# Patient Record
Sex: Female | Born: 1935 | Race: Black or African American | Hispanic: No | Smoking: Never smoker
Health system: Southern US, Community
[De-identification: ages and names within clinical notes are randomized; demographics above are authoritative.]

## PROBLEM LIST (undated history)

## (undated) DIAGNOSIS — K219 Gastro-esophageal reflux disease without esophagitis: Secondary | ICD-10-CM

## (undated) DIAGNOSIS — I639 Cerebral infarction, unspecified: Secondary | ICD-10-CM

## (undated) DIAGNOSIS — M1990 Unspecified osteoarthritis, unspecified site: Secondary | ICD-10-CM

## (undated) DIAGNOSIS — D649 Anemia, unspecified: Secondary | ICD-10-CM

## (undated) DIAGNOSIS — B351 Tinea unguium: Secondary | ICD-10-CM

## (undated) DIAGNOSIS — N39490 Overflow incontinence: Secondary | ICD-10-CM

## (undated) DIAGNOSIS — R634 Abnormal weight loss: Secondary | ICD-10-CM

## (undated) DIAGNOSIS — I4891 Unspecified atrial fibrillation: Secondary | ICD-10-CM

## (undated) DIAGNOSIS — K5730 Diverticulosis of large intestine without perforation or abscess without bleeding: Secondary | ICD-10-CM

## (undated) DIAGNOSIS — N393 Stress incontinence (female) (male): Secondary | ICD-10-CM

## (undated) DIAGNOSIS — I509 Heart failure, unspecified: Secondary | ICD-10-CM

## (undated) DIAGNOSIS — F0390 Unspecified dementia without behavioral disturbance: Secondary | ICD-10-CM

## (undated) DIAGNOSIS — Z8742 Personal history of other diseases of the female genital tract: Secondary | ICD-10-CM

## (undated) DIAGNOSIS — J984 Other disorders of lung: Secondary | ICD-10-CM

## (undated) DIAGNOSIS — Z950 Presence of cardiac pacemaker: Secondary | ICD-10-CM

## (undated) DIAGNOSIS — E119 Type 2 diabetes mellitus without complications: Secondary | ICD-10-CM

## (undated) DIAGNOSIS — D126 Benign neoplasm of colon, unspecified: Secondary | ICD-10-CM

## (undated) DIAGNOSIS — I10 Essential (primary) hypertension: Secondary | ICD-10-CM

## (undated) DIAGNOSIS — Z8619 Personal history of other infectious and parasitic diseases: Secondary | ICD-10-CM

## (undated) HISTORY — DX: Unspecified osteoarthritis, unspecified site: M19.90

## (undated) HISTORY — DX: Heart failure, unspecified: I50.9

## (undated) HISTORY — DX: Abnormal weight loss: R63.4

## (undated) HISTORY — DX: Other disorders of lung: J98.4

## (undated) HISTORY — DX: Essential (primary) hypertension: I10

## (undated) HISTORY — PX: CHOLECYSTECTOMY: SHX55

## (undated) HISTORY — DX: Benign neoplasm of colon, unspecified: D12.6

## (undated) HISTORY — DX: Cerebral infarction, unspecified: I63.9

## (undated) HISTORY — DX: Diverticulosis of large intestine without perforation or abscess without bleeding: K57.30

## (undated) HISTORY — DX: Personal history of other diseases of the female genital tract: Z87.42

## (undated) HISTORY — DX: Tinea unguium: B35.1

## (undated) HISTORY — PX: COLONOSCOPY: SHX174

## (undated) HISTORY — DX: Overflow incontinence: N39.490

## (undated) HISTORY — DX: Unspecified atrial fibrillation: I48.91

## (undated) HISTORY — PX: ESOPHAGOGASTRODUODENOSCOPY: SHX1529

## (undated) HISTORY — DX: Stress incontinence (female) (male): N39.3

## (undated) HISTORY — PX: CATARACT EXTRACTION: SUR2

## (undated) HISTORY — PX: ABDOMINAL HYSTERECTOMY: SHX81

## (undated) HISTORY — DX: Personal history of other infectious and parasitic diseases: Z86.19

## (undated) HISTORY — DX: Anemia, unspecified: D64.9

## (undated) HISTORY — DX: Gastro-esophageal reflux disease without esophagitis: K21.9

---

## 1994-04-18 DIAGNOSIS — J984 Other disorders of lung: Secondary | ICD-10-CM

## 1994-04-18 HISTORY — DX: Other disorders of lung: J98.4

## 1997-11-10 ENCOUNTER — Encounter: Admission: RE | Admit: 1997-11-10 | Discharge: 1997-11-10 | Payer: Self-pay | Admitting: Internal Medicine

## 1998-01-20 ENCOUNTER — Ambulatory Visit: Admission: RE | Admit: 1998-01-20 | Discharge: 1998-01-20 | Payer: Self-pay | Admitting: *Deleted

## 1998-02-13 ENCOUNTER — Encounter: Admission: RE | Admit: 1998-02-13 | Discharge: 1998-02-13 | Payer: Self-pay | Admitting: Internal Medicine

## 1998-02-13 ENCOUNTER — Ambulatory Visit (HOSPITAL_COMMUNITY): Admission: RE | Admit: 1998-02-13 | Discharge: 1998-02-13 | Payer: Self-pay | Admitting: Internal Medicine

## 1998-02-13 ENCOUNTER — Encounter: Payer: Self-pay | Admitting: Internal Medicine

## 1998-02-23 ENCOUNTER — Encounter: Admission: RE | Admit: 1998-02-23 | Discharge: 1998-02-23 | Payer: Self-pay | Admitting: Internal Medicine

## 1998-03-19 ENCOUNTER — Encounter: Admission: RE | Admit: 1998-03-19 | Discharge: 1998-03-19 | Payer: Self-pay | Admitting: Internal Medicine

## 1998-03-19 ENCOUNTER — Encounter: Payer: Self-pay | Admitting: Internal Medicine

## 1998-03-19 ENCOUNTER — Ambulatory Visit (HOSPITAL_COMMUNITY): Admission: RE | Admit: 1998-03-19 | Discharge: 1998-03-19 | Payer: Self-pay | Admitting: Internal Medicine

## 1998-04-18 HISTORY — PX: INSERT / REPLACE / REMOVE PACEMAKER: SUR710

## 1998-07-07 ENCOUNTER — Encounter: Admission: RE | Admit: 1998-07-07 | Discharge: 1998-07-07 | Payer: Self-pay | Admitting: Hematology and Oncology

## 1998-09-29 ENCOUNTER — Encounter: Admission: RE | Admit: 1998-09-29 | Discharge: 1998-09-29 | Payer: Self-pay | Admitting: Hematology and Oncology

## 1998-10-13 ENCOUNTER — Encounter: Admission: RE | Admit: 1998-10-13 | Discharge: 1998-10-13 | Payer: Self-pay | Admitting: Hematology and Oncology

## 1999-01-25 ENCOUNTER — Encounter: Admission: RE | Admit: 1999-01-25 | Discharge: 1999-01-25 | Payer: Self-pay | Admitting: Internal Medicine

## 1999-02-01 ENCOUNTER — Encounter: Payer: Self-pay | Admitting: Emergency Medicine

## 1999-02-01 ENCOUNTER — Inpatient Hospital Stay (HOSPITAL_COMMUNITY): Admission: EM | Admit: 1999-02-01 | Discharge: 1999-02-08 | Payer: Self-pay | Admitting: Emergency Medicine

## 1999-02-19 ENCOUNTER — Encounter: Admission: RE | Admit: 1999-02-19 | Discharge: 1999-02-19 | Payer: Self-pay | Admitting: Internal Medicine

## 1999-03-29 ENCOUNTER — Inpatient Hospital Stay (HOSPITAL_COMMUNITY): Admission: EM | Admit: 1999-03-29 | Discharge: 1999-04-03 | Payer: Self-pay | Admitting: Internal Medicine

## 1999-03-29 ENCOUNTER — Encounter: Payer: Self-pay | Admitting: Internal Medicine

## 1999-03-29 ENCOUNTER — Encounter: Admission: RE | Admit: 1999-03-29 | Discharge: 1999-03-29 | Payer: Self-pay | Admitting: Internal Medicine

## 1999-04-02 ENCOUNTER — Encounter: Payer: Self-pay | Admitting: Internal Medicine

## 1999-05-10 ENCOUNTER — Encounter: Admission: RE | Admit: 1999-05-10 | Discharge: 1999-05-10 | Payer: Self-pay | Admitting: Internal Medicine

## 1999-09-13 ENCOUNTER — Encounter: Admission: RE | Admit: 1999-09-13 | Discharge: 1999-09-13 | Payer: Self-pay | Admitting: Hematology and Oncology

## 1999-09-27 ENCOUNTER — Encounter: Admission: RE | Admit: 1999-09-27 | Discharge: 1999-09-27 | Payer: Self-pay | Admitting: Hematology and Oncology

## 1999-11-15 ENCOUNTER — Encounter: Admission: RE | Admit: 1999-11-15 | Discharge: 1999-11-15 | Payer: Self-pay | Admitting: Internal Medicine

## 1999-11-23 ENCOUNTER — Ambulatory Visit (HOSPITAL_COMMUNITY): Admission: RE | Admit: 1999-11-23 | Discharge: 1999-11-23 | Payer: Self-pay | Admitting: Hematology and Oncology

## 2000-01-11 ENCOUNTER — Ambulatory Visit (HOSPITAL_COMMUNITY): Admission: RE | Admit: 2000-01-11 | Discharge: 2000-01-11 | Payer: Self-pay | Admitting: Internal Medicine

## 2000-01-11 ENCOUNTER — Encounter: Payer: Self-pay | Admitting: Internal Medicine

## 2000-01-11 ENCOUNTER — Encounter: Admission: RE | Admit: 2000-01-11 | Discharge: 2000-01-11 | Payer: Self-pay | Admitting: Internal Medicine

## 2000-01-24 ENCOUNTER — Observation Stay (HOSPITAL_COMMUNITY): Admission: EM | Admit: 2000-01-24 | Discharge: 2000-01-24 | Payer: Self-pay | Admitting: Emergency Medicine

## 2000-01-31 ENCOUNTER — Ambulatory Visit (HOSPITAL_COMMUNITY): Admission: RE | Admit: 2000-01-31 | Discharge: 2000-01-31 | Payer: Self-pay | Admitting: Internal Medicine

## 2000-01-31 ENCOUNTER — Encounter: Admission: RE | Admit: 2000-01-31 | Discharge: 2000-01-31 | Payer: Self-pay | Admitting: Internal Medicine

## 2000-03-28 ENCOUNTER — Ambulatory Visit (HOSPITAL_COMMUNITY): Admission: RE | Admit: 2000-03-28 | Discharge: 2000-03-28 | Payer: Self-pay

## 2000-03-28 ENCOUNTER — Encounter: Admission: RE | Admit: 2000-03-28 | Discharge: 2000-03-28 | Payer: Self-pay

## 2000-04-18 DIAGNOSIS — I639 Cerebral infarction, unspecified: Secondary | ICD-10-CM

## 2000-04-18 HISTORY — DX: Cerebral infarction, unspecified: I63.9

## 2000-04-19 ENCOUNTER — Encounter: Admission: RE | Admit: 2000-04-19 | Discharge: 2000-04-19 | Payer: Self-pay | Admitting: Hematology and Oncology

## 2000-04-19 ENCOUNTER — Emergency Department (HOSPITAL_COMMUNITY): Admission: EM | Admit: 2000-04-19 | Discharge: 2000-04-19 | Payer: Self-pay

## 2000-04-20 ENCOUNTER — Encounter: Payer: Self-pay | Admitting: Internal Medicine

## 2000-04-20 ENCOUNTER — Inpatient Hospital Stay (HOSPITAL_COMMUNITY): Admission: EM | Admit: 2000-04-20 | Discharge: 2000-04-29 | Payer: Self-pay

## 2000-04-22 ENCOUNTER — Encounter: Payer: Self-pay | Admitting: Internal Medicine

## 2000-05-11 ENCOUNTER — Encounter: Admission: RE | Admit: 2000-05-11 | Discharge: 2000-05-11 | Payer: Self-pay | Admitting: Internal Medicine

## 2000-05-29 ENCOUNTER — Encounter: Admission: RE | Admit: 2000-05-29 | Discharge: 2000-05-29 | Payer: Self-pay | Admitting: Internal Medicine

## 2000-06-05 ENCOUNTER — Encounter: Admission: RE | Admit: 2000-06-05 | Discharge: 2000-06-05 | Payer: Self-pay

## 2000-06-12 ENCOUNTER — Encounter: Admission: RE | Admit: 2000-06-12 | Discharge: 2000-06-12 | Payer: Self-pay | Admitting: Internal Medicine

## 2000-06-26 ENCOUNTER — Encounter: Admission: RE | Admit: 2000-06-26 | Discharge: 2000-06-26 | Payer: Self-pay | Admitting: Internal Medicine

## 2000-07-04 ENCOUNTER — Other Ambulatory Visit: Admission: RE | Admit: 2000-07-04 | Discharge: 2000-07-04 | Payer: Self-pay | Admitting: Obstetrics and Gynecology

## 2000-07-17 ENCOUNTER — Encounter: Admission: RE | Admit: 2000-07-17 | Discharge: 2000-07-17 | Payer: Self-pay | Admitting: Internal Medicine

## 2000-07-27 ENCOUNTER — Emergency Department (HOSPITAL_COMMUNITY): Admission: EM | Admit: 2000-07-27 | Discharge: 2000-07-27 | Payer: Self-pay | Admitting: Emergency Medicine

## 2000-07-27 ENCOUNTER — Encounter: Payer: Self-pay | Admitting: Emergency Medicine

## 2000-07-31 ENCOUNTER — Encounter: Admission: RE | Admit: 2000-07-31 | Discharge: 2000-07-31 | Payer: Self-pay

## 2000-08-28 ENCOUNTER — Encounter: Admission: RE | Admit: 2000-08-28 | Discharge: 2000-08-28 | Payer: Self-pay | Admitting: Internal Medicine

## 2000-09-18 ENCOUNTER — Encounter: Admission: RE | Admit: 2000-09-18 | Discharge: 2000-09-18 | Payer: Self-pay | Admitting: Internal Medicine

## 2000-10-27 ENCOUNTER — Encounter: Admission: RE | Admit: 2000-10-27 | Discharge: 2000-10-27 | Payer: Self-pay | Admitting: Internal Medicine

## 2000-11-02 ENCOUNTER — Encounter: Admission: RE | Admit: 2000-11-02 | Discharge: 2000-11-02 | Payer: Self-pay | Admitting: Internal Medicine

## 2000-12-04 ENCOUNTER — Encounter: Admission: RE | Admit: 2000-12-04 | Discharge: 2000-12-04 | Payer: Self-pay | Admitting: Internal Medicine

## 2000-12-15 ENCOUNTER — Encounter: Admission: RE | Admit: 2000-12-15 | Discharge: 2000-12-15 | Payer: Self-pay | Admitting: Internal Medicine

## 2000-12-29 ENCOUNTER — Encounter: Admission: RE | Admit: 2000-12-29 | Discharge: 2000-12-29 | Payer: Self-pay | Admitting: Internal Medicine

## 2001-01-01 ENCOUNTER — Encounter: Admission: RE | Admit: 2001-01-01 | Discharge: 2001-01-01 | Payer: Self-pay | Admitting: Internal Medicine

## 2001-01-15 ENCOUNTER — Encounter: Admission: RE | Admit: 2001-01-15 | Discharge: 2001-01-15 | Payer: Self-pay | Admitting: Internal Medicine

## 2001-01-22 ENCOUNTER — Encounter: Admission: RE | Admit: 2001-01-22 | Discharge: 2001-01-22 | Payer: Self-pay

## 2001-02-15 ENCOUNTER — Encounter: Admission: RE | Admit: 2001-02-15 | Discharge: 2001-02-15 | Payer: Self-pay

## 2001-03-06 ENCOUNTER — Ambulatory Visit (HOSPITAL_COMMUNITY): Admission: RE | Admit: 2001-03-06 | Discharge: 2001-03-06 | Payer: Self-pay | Admitting: Obstetrics

## 2001-03-12 ENCOUNTER — Encounter: Admission: RE | Admit: 2001-03-12 | Discharge: 2001-03-12 | Payer: Self-pay | Admitting: Internal Medicine

## 2001-03-27 LAB — FECAL OCCULT BLOOD, GUAIAC: Fecal Occult Blood: NEGATIVE

## 2001-03-28 ENCOUNTER — Encounter: Admission: RE | Admit: 2001-03-28 | Discharge: 2001-03-28 | Payer: Self-pay | Admitting: Internal Medicine

## 2001-04-16 ENCOUNTER — Encounter: Admission: RE | Admit: 2001-04-16 | Discharge: 2001-04-16 | Payer: Self-pay

## 2001-05-07 ENCOUNTER — Encounter: Admission: RE | Admit: 2001-05-07 | Discharge: 2001-05-07 | Payer: Self-pay

## 2001-05-21 ENCOUNTER — Encounter: Admission: RE | Admit: 2001-05-21 | Discharge: 2001-05-21 | Payer: Self-pay | Admitting: Internal Medicine

## 2001-06-11 ENCOUNTER — Encounter: Admission: RE | Admit: 2001-06-11 | Discharge: 2001-06-11 | Payer: Self-pay

## 2001-06-28 ENCOUNTER — Encounter: Admission: RE | Admit: 2001-06-28 | Discharge: 2001-06-28 | Payer: Self-pay

## 2001-07-09 ENCOUNTER — Encounter: Admission: RE | Admit: 2001-07-09 | Discharge: 2001-07-09 | Payer: Self-pay | Admitting: Internal Medicine

## 2001-07-23 ENCOUNTER — Encounter: Admission: RE | Admit: 2001-07-23 | Discharge: 2001-07-23 | Payer: Self-pay | Admitting: Internal Medicine

## 2001-08-14 ENCOUNTER — Encounter: Admission: RE | Admit: 2001-08-14 | Discharge: 2001-08-14 | Payer: Self-pay

## 2001-09-17 ENCOUNTER — Encounter: Admission: RE | Admit: 2001-09-17 | Discharge: 2001-09-17 | Payer: Self-pay | Admitting: Internal Medicine

## 2001-10-01 ENCOUNTER — Encounter: Admission: RE | Admit: 2001-10-01 | Discharge: 2001-10-01 | Payer: Self-pay | Admitting: Internal Medicine

## 2001-10-02 ENCOUNTER — Encounter: Admission: RE | Admit: 2001-10-02 | Discharge: 2001-10-02 | Payer: Self-pay | Admitting: Internal Medicine

## 2001-10-29 ENCOUNTER — Encounter: Admission: RE | Admit: 2001-10-29 | Discharge: 2001-10-29 | Payer: Self-pay | Admitting: Internal Medicine

## 2001-11-19 ENCOUNTER — Encounter: Admission: RE | Admit: 2001-11-19 | Discharge: 2001-11-19 | Payer: Self-pay | Admitting: Internal Medicine

## 2001-11-30 ENCOUNTER — Encounter: Admission: RE | Admit: 2001-11-30 | Discharge: 2001-11-30 | Payer: Self-pay | Admitting: Internal Medicine

## 2002-01-07 ENCOUNTER — Encounter: Admission: RE | Admit: 2002-01-07 | Discharge: 2002-01-07 | Payer: Self-pay | Admitting: Internal Medicine

## 2002-02-01 ENCOUNTER — Encounter: Admission: RE | Admit: 2002-02-01 | Discharge: 2002-02-01 | Payer: Self-pay | Admitting: Internal Medicine

## 2002-02-05 ENCOUNTER — Encounter: Admission: RE | Admit: 2002-02-05 | Discharge: 2002-02-05 | Payer: Self-pay | Admitting: Internal Medicine

## 2002-02-11 ENCOUNTER — Encounter: Admission: RE | Admit: 2002-02-11 | Discharge: 2002-02-11 | Payer: Self-pay | Admitting: Internal Medicine

## 2002-02-25 ENCOUNTER — Encounter: Admission: RE | Admit: 2002-02-25 | Discharge: 2002-02-25 | Payer: Self-pay | Admitting: Internal Medicine

## 2002-03-11 ENCOUNTER — Encounter: Admission: RE | Admit: 2002-03-11 | Discharge: 2002-03-11 | Payer: Self-pay | Admitting: Internal Medicine

## 2002-03-28 ENCOUNTER — Encounter: Payer: Self-pay | Admitting: Internal Medicine

## 2002-03-28 ENCOUNTER — Ambulatory Visit (HOSPITAL_COMMUNITY): Admission: RE | Admit: 2002-03-28 | Discharge: 2002-03-28 | Payer: Self-pay | Admitting: Internal Medicine

## 2002-04-22 ENCOUNTER — Encounter: Admission: RE | Admit: 2002-04-22 | Discharge: 2002-04-22 | Payer: Self-pay | Admitting: Internal Medicine

## 2002-05-24 ENCOUNTER — Encounter: Admission: RE | Admit: 2002-05-24 | Discharge: 2002-05-24 | Payer: Self-pay | Admitting: Internal Medicine

## 2002-06-10 ENCOUNTER — Encounter: Admission: RE | Admit: 2002-06-10 | Discharge: 2002-06-10 | Payer: Self-pay | Admitting: Internal Medicine

## 2002-07-11 ENCOUNTER — Emergency Department (HOSPITAL_COMMUNITY): Admission: EM | Admit: 2002-07-11 | Discharge: 2002-07-11 | Payer: Self-pay | Admitting: Emergency Medicine

## 2002-07-11 ENCOUNTER — Encounter: Admission: RE | Admit: 2002-07-11 | Discharge: 2002-07-11 | Payer: Self-pay | Admitting: Internal Medicine

## 2002-07-19 ENCOUNTER — Encounter: Admission: RE | Admit: 2002-07-19 | Discharge: 2002-07-19 | Payer: Self-pay | Admitting: Internal Medicine

## 2002-07-25 ENCOUNTER — Ambulatory Visit (HOSPITAL_COMMUNITY): Admission: RE | Admit: 2002-07-25 | Discharge: 2002-07-25 | Payer: Self-pay | Admitting: Internal Medicine

## 2002-07-25 ENCOUNTER — Encounter: Payer: Self-pay | Admitting: Internal Medicine

## 2002-08-05 ENCOUNTER — Encounter: Admission: RE | Admit: 2002-08-05 | Discharge: 2002-08-05 | Payer: Self-pay | Admitting: Internal Medicine

## 2002-09-09 ENCOUNTER — Encounter: Admission: RE | Admit: 2002-09-09 | Discharge: 2002-09-09 | Payer: Self-pay | Admitting: Internal Medicine

## 2002-10-10 ENCOUNTER — Encounter: Admission: RE | Admit: 2002-10-10 | Discharge: 2002-10-10 | Payer: Self-pay | Admitting: Internal Medicine

## 2002-11-04 ENCOUNTER — Encounter: Admission: RE | Admit: 2002-11-04 | Discharge: 2002-11-04 | Payer: Self-pay | Admitting: Internal Medicine

## 2002-11-11 ENCOUNTER — Encounter: Admission: RE | Admit: 2002-11-11 | Discharge: 2002-11-11 | Payer: Self-pay | Admitting: Internal Medicine

## 2002-12-02 ENCOUNTER — Encounter: Admission: RE | Admit: 2002-12-02 | Discharge: 2002-12-02 | Payer: Self-pay | Admitting: Internal Medicine

## 2003-01-13 ENCOUNTER — Encounter: Admission: RE | Admit: 2003-01-13 | Discharge: 2003-01-13 | Payer: Self-pay | Admitting: Internal Medicine

## 2003-01-30 ENCOUNTER — Encounter: Admission: RE | Admit: 2003-01-30 | Discharge: 2003-01-30 | Payer: Self-pay | Admitting: Internal Medicine

## 2003-02-24 ENCOUNTER — Encounter: Admission: RE | Admit: 2003-02-24 | Discharge: 2003-02-24 | Payer: Self-pay | Admitting: Internal Medicine

## 2003-03-21 ENCOUNTER — Encounter: Admission: RE | Admit: 2003-03-21 | Discharge: 2003-03-21 | Payer: Self-pay | Admitting: Internal Medicine

## 2003-04-01 ENCOUNTER — Ambulatory Visit (HOSPITAL_COMMUNITY): Admission: RE | Admit: 2003-04-01 | Discharge: 2003-04-01 | Payer: Self-pay | Admitting: Internal Medicine

## 2003-05-05 ENCOUNTER — Encounter: Admission: RE | Admit: 2003-05-05 | Discharge: 2003-05-05 | Payer: Self-pay | Admitting: Internal Medicine

## 2003-06-02 ENCOUNTER — Encounter: Admission: RE | Admit: 2003-06-02 | Discharge: 2003-06-02 | Payer: Self-pay | Admitting: Internal Medicine

## 2003-06-30 ENCOUNTER — Encounter: Admission: RE | Admit: 2003-06-30 | Discharge: 2003-06-30 | Payer: Self-pay | Admitting: Internal Medicine

## 2003-07-28 ENCOUNTER — Encounter: Admission: RE | Admit: 2003-07-28 | Discharge: 2003-07-28 | Payer: Self-pay | Admitting: Internal Medicine

## 2003-08-04 ENCOUNTER — Encounter: Admission: RE | Admit: 2003-08-04 | Discharge: 2003-08-04 | Payer: Self-pay | Admitting: Internal Medicine

## 2003-08-13 ENCOUNTER — Encounter: Admission: RE | Admit: 2003-08-13 | Discharge: 2003-08-13 | Payer: Self-pay | Admitting: Internal Medicine

## 2003-08-25 ENCOUNTER — Encounter: Admission: RE | Admit: 2003-08-25 | Discharge: 2003-08-25 | Payer: Self-pay | Admitting: Internal Medicine

## 2003-09-01 ENCOUNTER — Encounter (HOSPITAL_BASED_OUTPATIENT_CLINIC_OR_DEPARTMENT_OTHER): Admission: RE | Admit: 2003-09-01 | Discharge: 2003-09-05 | Payer: Self-pay | Admitting: Internal Medicine

## 2003-09-29 ENCOUNTER — Encounter: Admission: RE | Admit: 2003-09-29 | Discharge: 2003-09-29 | Payer: Self-pay | Admitting: Internal Medicine

## 2003-10-30 ENCOUNTER — Encounter: Admission: RE | Admit: 2003-10-30 | Discharge: 2003-10-30 | Payer: Self-pay | Admitting: Internal Medicine

## 2003-11-27 ENCOUNTER — Encounter (INDEPENDENT_AMBULATORY_CARE_PROVIDER_SITE_OTHER): Payer: Self-pay | Admitting: Internal Medicine

## 2003-11-27 LAB — CONVERTED CEMR LAB: Pap Smear: NORMAL

## 2003-12-01 ENCOUNTER — Encounter: Admission: RE | Admit: 2003-12-01 | Discharge: 2003-12-01 | Payer: Self-pay | Admitting: Internal Medicine

## 2004-01-05 ENCOUNTER — Ambulatory Visit: Payer: Self-pay | Admitting: Internal Medicine

## 2004-01-14 ENCOUNTER — Encounter (HOSPITAL_BASED_OUTPATIENT_CLINIC_OR_DEPARTMENT_OTHER): Admission: RE | Admit: 2004-01-14 | Discharge: 2004-02-04 | Payer: Self-pay | Admitting: Internal Medicine

## 2004-02-03 ENCOUNTER — Ambulatory Visit: Payer: Self-pay | Admitting: Internal Medicine

## 2004-02-09 ENCOUNTER — Ambulatory Visit: Payer: Self-pay | Admitting: Internal Medicine

## 2004-02-23 ENCOUNTER — Ambulatory Visit: Payer: Self-pay | Admitting: Internal Medicine

## 2004-02-24 ENCOUNTER — Ambulatory Visit: Payer: Self-pay | Admitting: Internal Medicine

## 2004-03-01 ENCOUNTER — Ambulatory Visit: Payer: Self-pay | Admitting: Internal Medicine

## 2004-03-29 ENCOUNTER — Ambulatory Visit: Payer: Self-pay | Admitting: Internal Medicine

## 2004-04-05 ENCOUNTER — Ambulatory Visit (HOSPITAL_COMMUNITY): Admission: RE | Admit: 2004-04-05 | Discharge: 2004-04-05 | Payer: Self-pay | Admitting: Internal Medicine

## 2004-04-07 ENCOUNTER — Ambulatory Visit (HOSPITAL_COMMUNITY): Admission: RE | Admit: 2004-04-07 | Discharge: 2004-04-07 | Payer: Self-pay | Admitting: Internal Medicine

## 2004-04-27 ENCOUNTER — Encounter: Admission: RE | Admit: 2004-04-27 | Discharge: 2004-04-27 | Payer: Self-pay | Admitting: Internal Medicine

## 2004-04-27 ENCOUNTER — Encounter (HOSPITAL_BASED_OUTPATIENT_CLINIC_OR_DEPARTMENT_OTHER): Admission: RE | Admit: 2004-04-27 | Discharge: 2004-05-24 | Payer: Self-pay | Admitting: Internal Medicine

## 2004-04-29 ENCOUNTER — Ambulatory Visit: Payer: Self-pay | Admitting: Internal Medicine

## 2004-05-04 ENCOUNTER — Ambulatory Visit: Payer: Self-pay | Admitting: Internal Medicine

## 2004-06-10 ENCOUNTER — Ambulatory Visit: Payer: Self-pay | Admitting: Internal Medicine

## 2004-07-07 ENCOUNTER — Ambulatory Visit: Payer: Self-pay | Admitting: Internal Medicine

## 2004-07-20 ENCOUNTER — Ambulatory Visit: Payer: Self-pay | Admitting: Internal Medicine

## 2004-08-23 ENCOUNTER — Ambulatory Visit: Payer: Self-pay | Admitting: Internal Medicine

## 2004-09-27 ENCOUNTER — Ambulatory Visit: Payer: Self-pay | Admitting: Internal Medicine

## 2004-10-28 ENCOUNTER — Ambulatory Visit: Payer: Self-pay | Admitting: Internal Medicine

## 2004-11-15 ENCOUNTER — Ambulatory Visit: Payer: Self-pay | Admitting: Internal Medicine

## 2004-11-17 ENCOUNTER — Ambulatory Visit (HOSPITAL_COMMUNITY): Admission: RE | Admit: 2004-11-17 | Discharge: 2004-11-17 | Payer: Self-pay | Admitting: Internal Medicine

## 2004-11-19 ENCOUNTER — Ambulatory Visit (HOSPITAL_COMMUNITY): Admission: RE | Admit: 2004-11-19 | Discharge: 2004-11-19 | Payer: Self-pay | Admitting: Internal Medicine

## 2004-12-09 ENCOUNTER — Ambulatory Visit: Payer: Self-pay | Admitting: Internal Medicine

## 2005-01-03 ENCOUNTER — Ambulatory Visit: Payer: Self-pay | Admitting: Internal Medicine

## 2005-01-24 ENCOUNTER — Ambulatory Visit: Payer: Self-pay | Admitting: Hospitalist

## 2005-02-08 ENCOUNTER — Ambulatory Visit: Payer: Self-pay | Admitting: Internal Medicine

## 2005-02-21 ENCOUNTER — Ambulatory Visit: Payer: Self-pay | Admitting: Internal Medicine

## 2005-03-17 ENCOUNTER — Ambulatory Visit: Payer: Self-pay | Admitting: Internal Medicine

## 2005-04-04 ENCOUNTER — Ambulatory Visit: Payer: Self-pay | Admitting: Internal Medicine

## 2005-05-02 ENCOUNTER — Ambulatory Visit: Payer: Self-pay | Admitting: Internal Medicine

## 2005-05-12 ENCOUNTER — Ambulatory Visit (HOSPITAL_COMMUNITY): Admission: RE | Admit: 2005-05-12 | Discharge: 2005-05-12 | Payer: Self-pay | Admitting: Internal Medicine

## 2005-05-19 DIAGNOSIS — Z8742 Personal history of other diseases of the female genital tract: Secondary | ICD-10-CM

## 2005-05-19 HISTORY — DX: Personal history of other diseases of the female genital tract: Z87.42

## 2005-05-27 ENCOUNTER — Encounter (INDEPENDENT_AMBULATORY_CARE_PROVIDER_SITE_OTHER): Payer: Self-pay | Admitting: *Deleted

## 2005-05-27 ENCOUNTER — Ambulatory Visit (HOSPITAL_COMMUNITY): Admission: RE | Admit: 2005-05-27 | Discharge: 2005-05-27 | Payer: Self-pay | Admitting: Obstetrics & Gynecology

## 2005-05-30 ENCOUNTER — Ambulatory Visit: Payer: Self-pay | Admitting: Internal Medicine

## 2005-06-20 ENCOUNTER — Ambulatory Visit: Payer: Self-pay | Admitting: Hospitalist

## 2005-08-01 ENCOUNTER — Ambulatory Visit: Payer: Self-pay | Admitting: Internal Medicine

## 2005-08-22 ENCOUNTER — Ambulatory Visit: Payer: Self-pay | Admitting: Internal Medicine

## 2005-08-30 ENCOUNTER — Ambulatory Visit: Payer: Self-pay | Admitting: Internal Medicine

## 2005-09-19 ENCOUNTER — Ambulatory Visit: Payer: Self-pay | Admitting: Internal Medicine

## 2005-10-21 ENCOUNTER — Ambulatory Visit: Payer: Self-pay | Admitting: Internal Medicine

## 2005-11-02 ENCOUNTER — Ambulatory Visit (HOSPITAL_COMMUNITY): Admission: RE | Admit: 2005-11-02 | Discharge: 2005-11-02 | Payer: Self-pay | Admitting: Internal Medicine

## 2005-11-07 ENCOUNTER — Ambulatory Visit: Payer: Self-pay | Admitting: Hospitalist

## 2005-12-05 ENCOUNTER — Ambulatory Visit: Payer: Self-pay | Admitting: Internal Medicine

## 2006-01-02 ENCOUNTER — Ambulatory Visit: Payer: Self-pay | Admitting: Internal Medicine

## 2006-01-04 ENCOUNTER — Ambulatory Visit (HOSPITAL_COMMUNITY): Admission: RE | Admit: 2006-01-04 | Discharge: 2006-01-04 | Payer: Self-pay | Admitting: Internal Medicine

## 2006-01-23 ENCOUNTER — Ambulatory Visit: Payer: Self-pay | Admitting: Internal Medicine

## 2006-01-25 ENCOUNTER — Ambulatory Visit (HOSPITAL_COMMUNITY): Admission: RE | Admit: 2006-01-25 | Discharge: 2006-01-25 | Payer: Self-pay | Admitting: *Deleted

## 2006-03-07 ENCOUNTER — Ambulatory Visit: Payer: Self-pay | Admitting: Internal Medicine

## 2006-03-22 ENCOUNTER — Ambulatory Visit: Payer: Self-pay | Admitting: Internal Medicine

## 2006-04-19 ENCOUNTER — Encounter (INDEPENDENT_AMBULATORY_CARE_PROVIDER_SITE_OTHER): Payer: Self-pay | Admitting: Internal Medicine

## 2006-05-01 ENCOUNTER — Ambulatory Visit: Payer: Self-pay | Admitting: Internal Medicine

## 2006-05-01 LAB — CONVERTED CEMR LAB
ALT: 14 units/L (ref 0–35)
AST: 29 units/L (ref 0–37)
Albumin: 4.4 g/dL (ref 3.5–5.2)
Alkaline Phosphatase: 126 units/L — ABNORMAL HIGH (ref 39–117)
BUN: 13 mg/dL (ref 6–23)
Calcium: 9.9 mg/dL (ref 8.4–10.5)
Chloride: 104 meq/L (ref 96–112)
Creatinine, Ser: 1.11 mg/dL (ref 0.40–1.20)
HDL: 58 mg/dL (ref >39)
LDL Cholesterol: 64 mg/dL (ref 0–99)
Potassium: 4.3 meq/L (ref 3.5–5.3)
Total CHOL/HDL Ratio: 2.5

## 2006-05-16 ENCOUNTER — Ambulatory Visit (HOSPITAL_COMMUNITY): Admission: RE | Admit: 2006-05-16 | Discharge: 2006-05-16 | Payer: Self-pay | Admitting: Internal Medicine

## 2006-05-26 ENCOUNTER — Emergency Department (HOSPITAL_COMMUNITY): Admission: EM | Admit: 2006-05-26 | Discharge: 2006-05-26 | Payer: Self-pay | Admitting: Emergency Medicine

## 2006-06-12 ENCOUNTER — Ambulatory Visit: Payer: Self-pay | Admitting: Internal Medicine

## 2006-06-12 LAB — CONVERTED CEMR LAB: INR: 2.8

## 2006-07-17 ENCOUNTER — Ambulatory Visit: Payer: Self-pay | Admitting: *Deleted

## 2006-09-28 ENCOUNTER — Ambulatory Visit: Payer: Self-pay | Admitting: Internal Medicine

## 2006-09-28 LAB — CONVERTED CEMR LAB: INR: 1.9

## 2006-10-12 ENCOUNTER — Ambulatory Visit: Payer: Self-pay | Admitting: Internal Medicine

## 2006-10-16 ENCOUNTER — Ambulatory Visit: Payer: Self-pay | Admitting: Internal Medicine

## 2006-10-16 LAB — CONVERTED CEMR LAB: INR: 2

## 2006-10-23 ENCOUNTER — Telehealth (INDEPENDENT_AMBULATORY_CARE_PROVIDER_SITE_OTHER): Payer: Self-pay | Admitting: *Deleted

## 2006-10-24 ENCOUNTER — Telehealth: Payer: Self-pay | Admitting: *Deleted

## 2006-10-24 ENCOUNTER — Encounter (INDEPENDENT_AMBULATORY_CARE_PROVIDER_SITE_OTHER): Payer: Self-pay | Admitting: *Deleted

## 2006-11-13 ENCOUNTER — Ambulatory Visit: Payer: Self-pay | Admitting: *Deleted

## 2006-12-04 ENCOUNTER — Ambulatory Visit: Payer: Self-pay | Admitting: Internal Medicine

## 2006-12-04 ENCOUNTER — Encounter: Payer: Self-pay | Admitting: Internal Medicine

## 2006-12-04 LAB — CONVERTED CEMR LAB
Hemoglobin: 12.4 g/dL (ref 12.0–15.0)
INR: 9.2
INR: 9.2 (ref 0.0–1.5)
Prothrombin Time: 79.1 s — ABNORMAL HIGH (ref 11.6–15.2)
RBC: 3.97 M/uL (ref 3.87–5.11)
WBC: 4.9 10*3/uL (ref 4.0–10.5)

## 2006-12-05 ENCOUNTER — Ambulatory Visit: Payer: Self-pay | Admitting: Internal Medicine

## 2006-12-05 ENCOUNTER — Encounter: Admission: RE | Admit: 2006-12-05 | Discharge: 2006-12-05 | Payer: Self-pay | Admitting: *Deleted

## 2006-12-05 ENCOUNTER — Inpatient Hospital Stay (HOSPITAL_COMMUNITY): Admission: RE | Admit: 2006-12-05 | Discharge: 2006-12-06 | Payer: Self-pay | Admitting: *Deleted

## 2006-12-05 ENCOUNTER — Ambulatory Visit: Payer: Self-pay | Admitting: Cardiology

## 2006-12-05 ENCOUNTER — Ambulatory Visit: Payer: Self-pay | Admitting: *Deleted

## 2006-12-05 LAB — CONVERTED CEMR LAB: INR: 2.2

## 2006-12-06 ENCOUNTER — Encounter: Payer: Self-pay | Admitting: Internal Medicine

## 2006-12-11 ENCOUNTER — Ambulatory Visit: Payer: Self-pay | Admitting: Internal Medicine

## 2006-12-11 LAB — CONVERTED CEMR LAB

## 2007-01-24 ENCOUNTER — Telehealth: Payer: Self-pay | Admitting: *Deleted

## 2007-01-30 ENCOUNTER — Telehealth: Payer: Self-pay | Admitting: *Deleted

## 2007-02-05 ENCOUNTER — Ambulatory Visit: Payer: Self-pay | Admitting: Infectious Diseases

## 2007-02-05 LAB — CONVERTED CEMR LAB: INR: 5.8

## 2007-02-13 ENCOUNTER — Ambulatory Visit: Payer: Self-pay | Admitting: *Deleted

## 2007-02-13 LAB — CONVERTED CEMR LAB
ALT: 13 units/L (ref 0–35)
AST: 26 units/L (ref 0–37)
Alkaline Phosphatase: 96 units/L (ref 39–117)
BUN: 12 mg/dL (ref 6–23)
Calcium: 9.7 mg/dL (ref 8.4–10.5)
Chloride: 102 meq/L (ref 96–112)
Creatinine, Ser: 0.95 mg/dL (ref 0.40–1.20)
HDL: 56 mg/dL (ref >39)
INR: 2.2 — ABNORMAL HIGH (ref 0.0–1.5)
LDL Cholesterol: 74 mg/dL (ref 0–99)
Potassium: 3.8 meq/L (ref 3.5–5.3)
Prothrombin Time: 25.4 s — ABNORMAL HIGH (ref 11.6–15.2)
Total CHOL/HDL Ratio: 2.7
aPTT: 33 s (ref 24–37)

## 2007-02-19 ENCOUNTER — Ambulatory Visit: Payer: Self-pay | Admitting: Hospitalist

## 2007-02-19 LAB — CONVERTED CEMR LAB: INR: 2.6

## 2007-03-01 ENCOUNTER — Telehealth (INDEPENDENT_AMBULATORY_CARE_PROVIDER_SITE_OTHER): Payer: Self-pay | Admitting: *Deleted

## 2007-03-19 ENCOUNTER — Ambulatory Visit: Payer: Self-pay | Admitting: Hospitalist

## 2007-04-16 ENCOUNTER — Telehealth: Payer: Self-pay | Admitting: Internal Medicine

## 2007-05-01 ENCOUNTER — Encounter (INDEPENDENT_AMBULATORY_CARE_PROVIDER_SITE_OTHER): Payer: Self-pay | Admitting: *Deleted

## 2007-05-21 ENCOUNTER — Ambulatory Visit: Payer: Self-pay | Admitting: Hospitalist

## 2007-05-21 LAB — CONVERTED CEMR LAB

## 2007-05-24 ENCOUNTER — Ambulatory Visit (HOSPITAL_COMMUNITY): Admission: RE | Admit: 2007-05-24 | Discharge: 2007-05-24 | Payer: Self-pay | Admitting: *Deleted

## 2007-06-05 ENCOUNTER — Ambulatory Visit: Payer: Self-pay | Admitting: *Deleted

## 2007-06-05 LAB — CONVERTED CEMR LAB
BUN: 12 mg/dL (ref 6–23)
CO2: 25 meq/L (ref 19–32)
Chloride: 103 meq/L (ref 96–112)
Creatinine, Ser: 0.85 mg/dL (ref 0.40–1.20)
Glucose, Bld: 144 mg/dL — ABNORMAL HIGH (ref 70–99)
Potassium: 4.1 meq/L (ref 3.5–5.3)

## 2007-06-11 ENCOUNTER — Ambulatory Visit: Payer: Self-pay | Admitting: Internal Medicine

## 2007-06-12 ENCOUNTER — Ambulatory Visit: Payer: Self-pay | Admitting: Internal Medicine

## 2007-06-25 ENCOUNTER — Ambulatory Visit: Payer: Self-pay | Admitting: Infectious Diseases

## 2007-08-06 ENCOUNTER — Ambulatory Visit: Payer: Self-pay | Admitting: Hospitalist

## 2007-08-06 LAB — CONVERTED CEMR LAB: INR: 2.1

## 2007-09-09 ENCOUNTER — Emergency Department (HOSPITAL_COMMUNITY): Admission: EM | Admit: 2007-09-09 | Discharge: 2007-09-09 | Payer: Self-pay | Admitting: Emergency Medicine

## 2007-09-27 ENCOUNTER — Ambulatory Visit: Payer: Self-pay | Admitting: *Deleted

## 2007-09-27 LAB — CONVERTED CEMR LAB
Blood Glucose, Fingerstick: 132
Hgb A1c MFr Bld: 6.4 %

## 2007-11-19 ENCOUNTER — Ambulatory Visit: Payer: Self-pay | Admitting: Infectious Diseases

## 2007-11-19 LAB — CONVERTED CEMR LAB: INR: 3.6

## 2007-11-28 ENCOUNTER — Ambulatory Visit: Payer: Self-pay

## 2008-01-14 ENCOUNTER — Ambulatory Visit: Payer: Self-pay | Admitting: Internal Medicine

## 2008-01-23 ENCOUNTER — Emergency Department (HOSPITAL_COMMUNITY): Admission: EM | Admit: 2008-01-23 | Discharge: 2008-01-23 | Payer: Self-pay | Admitting: Emergency Medicine

## 2008-01-25 ENCOUNTER — Ambulatory Visit: Payer: Self-pay | Admitting: Vascular Surgery

## 2008-01-25 ENCOUNTER — Ambulatory Visit: Payer: Self-pay | Admitting: *Deleted

## 2008-01-25 ENCOUNTER — Encounter (INDEPENDENT_AMBULATORY_CARE_PROVIDER_SITE_OTHER): Payer: Self-pay | Admitting: *Deleted

## 2008-01-25 ENCOUNTER — Inpatient Hospital Stay (HOSPITAL_COMMUNITY): Admission: EM | Admit: 2008-01-25 | Discharge: 2008-01-29 | Payer: Self-pay | Admitting: Emergency Medicine

## 2008-01-25 ENCOUNTER — Ambulatory Visit: Payer: Self-pay | Admitting: Internal Medicine

## 2008-02-04 ENCOUNTER — Ambulatory Visit: Payer: Self-pay | Admitting: Internal Medicine

## 2008-02-04 LAB — CONVERTED CEMR LAB

## 2008-02-05 LAB — CONVERTED CEMR LAB: HDL: 46 mg/dL

## 2008-02-11 ENCOUNTER — Ambulatory Visit: Payer: Self-pay | Admitting: Internal Medicine

## 2008-02-11 LAB — CONVERTED CEMR LAB: INR: 1.5

## 2008-02-13 ENCOUNTER — Ambulatory Visit: Payer: Self-pay | Admitting: Internal Medicine

## 2008-02-13 ENCOUNTER — Encounter (INDEPENDENT_AMBULATORY_CARE_PROVIDER_SITE_OTHER): Payer: Self-pay | Admitting: *Deleted

## 2008-02-13 LAB — CONVERTED CEMR LAB
Blood Glucose, Fingerstick: 130
Hgb A1c MFr Bld: 7 %

## 2008-03-04 ENCOUNTER — Encounter (INDEPENDENT_AMBULATORY_CARE_PROVIDER_SITE_OTHER): Payer: Self-pay | Admitting: *Deleted

## 2008-03-11 ENCOUNTER — Ambulatory Visit: Payer: Self-pay | Admitting: *Deleted

## 2008-03-11 LAB — CONVERTED CEMR LAB
Blood Glucose, Fingerstick: 124
INR: 1.8

## 2008-03-17 ENCOUNTER — Ambulatory Visit: Payer: Self-pay | Admitting: Infectious Diseases

## 2008-03-17 LAB — CONVERTED CEMR LAB: INR: 2.5

## 2008-03-24 ENCOUNTER — Telehealth (INDEPENDENT_AMBULATORY_CARE_PROVIDER_SITE_OTHER): Payer: Self-pay | Admitting: *Deleted

## 2008-04-21 ENCOUNTER — Ambulatory Visit: Payer: Self-pay | Admitting: Internal Medicine

## 2008-04-28 ENCOUNTER — Ambulatory Visit: Payer: Self-pay | Admitting: Internal Medicine

## 2008-04-28 LAB — CONVERTED CEMR LAB: INR: 2.3

## 2008-05-27 ENCOUNTER — Encounter: Payer: Self-pay | Admitting: Internal Medicine

## 2008-06-10 ENCOUNTER — Ambulatory Visit: Payer: Self-pay | Admitting: Internal Medicine

## 2008-06-10 ENCOUNTER — Ambulatory Visit: Payer: Self-pay | Admitting: *Deleted

## 2008-06-10 LAB — CONVERTED CEMR LAB
ALT: 10 units/L (ref 0–35)
Alkaline Phosphatase: 89 units/L (ref 39–117)
CO2: 23 meq/L (ref 19–32)
Cholesterol: 157 mg/dL (ref 0–200)
Creatinine, Ser: 1.14 mg/dL (ref 0.40–1.20)
Hgb A1c MFr Bld: 6.4 %
INR: 1.8
LDL Cholesterol: 75 mg/dL (ref 0–99)
Total Bilirubin: 0.7 mg/dL (ref 0.3–1.2)
Total CHOL/HDL Ratio: 2.5
Triglycerides: 100 mg/dL (ref <150)
VLDL: 20 mg/dL (ref 0–40)

## 2008-06-23 ENCOUNTER — Ambulatory Visit: Payer: Self-pay | Admitting: Internal Medicine

## 2008-06-25 ENCOUNTER — Telehealth (INDEPENDENT_AMBULATORY_CARE_PROVIDER_SITE_OTHER): Payer: Self-pay | Admitting: *Deleted

## 2008-06-25 ENCOUNTER — Encounter (INDEPENDENT_AMBULATORY_CARE_PROVIDER_SITE_OTHER): Payer: Self-pay | Admitting: *Deleted

## 2008-07-01 ENCOUNTER — Telehealth (INDEPENDENT_AMBULATORY_CARE_PROVIDER_SITE_OTHER): Payer: Self-pay | Admitting: Pharmacy Technician

## 2008-07-01 ENCOUNTER — Ambulatory Visit (HOSPITAL_COMMUNITY): Admission: RE | Admit: 2008-07-01 | Discharge: 2008-07-01 | Payer: Self-pay | Admitting: *Deleted

## 2008-07-10 ENCOUNTER — Encounter (INDEPENDENT_AMBULATORY_CARE_PROVIDER_SITE_OTHER): Payer: Self-pay | Admitting: *Deleted

## 2008-07-14 ENCOUNTER — Ambulatory Visit: Payer: Self-pay | Admitting: Internal Medicine

## 2008-08-05 ENCOUNTER — Ambulatory Visit: Payer: Self-pay | Admitting: *Deleted

## 2008-08-06 ENCOUNTER — Encounter (INDEPENDENT_AMBULATORY_CARE_PROVIDER_SITE_OTHER): Payer: Self-pay | Admitting: *Deleted

## 2008-08-11 ENCOUNTER — Ambulatory Visit: Payer: Self-pay | Admitting: Internal Medicine

## 2008-08-11 LAB — CONVERTED CEMR LAB: INR: 2.1

## 2008-09-18 ENCOUNTER — Telehealth (INDEPENDENT_AMBULATORY_CARE_PROVIDER_SITE_OTHER): Payer: Self-pay | Admitting: *Deleted

## 2008-09-18 ENCOUNTER — Telehealth: Payer: Self-pay | Admitting: *Deleted

## 2008-10-27 ENCOUNTER — Ambulatory Visit: Payer: Self-pay | Admitting: Internal Medicine

## 2008-10-27 LAB — CONVERTED CEMR LAB

## 2008-11-20 ENCOUNTER — Ambulatory Visit: Payer: Self-pay

## 2008-11-20 ENCOUNTER — Encounter: Payer: Self-pay | Admitting: Internal Medicine

## 2008-12-15 ENCOUNTER — Ambulatory Visit (HOSPITAL_COMMUNITY): Admission: RE | Admit: 2008-12-15 | Discharge: 2008-12-15 | Payer: Self-pay | Admitting: Internal Medicine

## 2008-12-15 ENCOUNTER — Telehealth: Payer: Self-pay | Admitting: *Deleted

## 2008-12-15 ENCOUNTER — Ambulatory Visit: Payer: Self-pay | Admitting: Internal Medicine

## 2008-12-15 LAB — CONVERTED CEMR LAB
Blood Glucose, Fingerstick: 126
Hgb A1c MFr Bld: 6.5 %

## 2008-12-16 ENCOUNTER — Telehealth: Payer: Self-pay | Admitting: Internal Medicine

## 2009-01-12 ENCOUNTER — Ambulatory Visit: Payer: Self-pay | Admitting: Infectious Diseases

## 2009-02-05 ENCOUNTER — Telehealth: Payer: Self-pay | Admitting: Internal Medicine

## 2009-02-09 ENCOUNTER — Ambulatory Visit: Payer: Self-pay | Admitting: Internal Medicine

## 2009-02-10 ENCOUNTER — Telehealth: Payer: Self-pay | Admitting: Internal Medicine

## 2009-02-13 ENCOUNTER — Telehealth: Payer: Self-pay | Admitting: Internal Medicine

## 2009-03-03 ENCOUNTER — Ambulatory Visit: Payer: Self-pay | Admitting: Internal Medicine

## 2009-03-11 ENCOUNTER — Telehealth: Payer: Self-pay | Admitting: *Deleted

## 2009-03-16 LAB — CONVERTED CEMR LAB
ALT: 8 units/L (ref 0–35)
Albumin: 4.2 g/dL (ref 3.5–5.2)
BUN: 14 mg/dL (ref 6–23)
CO2: 21 meq/L (ref 19–32)
Calcium: 9 mg/dL (ref 8.4–10.5)
Creatinine, Ser: 1.05 mg/dL (ref 0.40–1.20)
Glucose, Bld: 149 mg/dL — ABNORMAL HIGH (ref 70–99)
HCT: 31.7 % — ABNORMAL LOW (ref 36.0–46.0)
Hemoglobin: 10 g/dL — ABNORMAL LOW (ref 12.0–15.0)
LDL Cholesterol: 68 mg/dL (ref 0–99)
MCV: 90.8 fL (ref >=78.0)
RBC: 3.49 M/uL — ABNORMAL LOW (ref 3.87–5.11)
TSH: 1.222 microintl units/mL (ref 0.350–4.5)
Total Bilirubin: 0.7 mg/dL (ref 0.3–1.2)
Vitamin B-12: 393 pg/mL (ref 211–911)
WBC: 5.3 10*3/uL (ref 4.0–10.5)

## 2009-03-17 ENCOUNTER — Ambulatory Visit: Payer: Self-pay | Admitting: Internal Medicine

## 2009-03-17 LAB — CONVERTED CEMR LAB: RBC Folate: 548 ng/mL (ref 180–600)

## 2009-03-27 LAB — CONVERTED CEMR LAB
Ferritin: 16 ng/mL (ref 10–291)
TIBC: 397 ug/dL (ref 250–470)
UIBC: 355 ug/dL

## 2009-04-27 ENCOUNTER — Ambulatory Visit: Payer: Self-pay | Admitting: Internal Medicine

## 2009-04-27 LAB — CONVERTED CEMR LAB: INR: 2

## 2009-05-18 ENCOUNTER — Ambulatory Visit: Payer: Self-pay | Admitting: Internal Medicine

## 2009-05-18 LAB — CONVERTED CEMR LAB: INR: 2.2

## 2009-05-26 ENCOUNTER — Ambulatory Visit: Payer: Self-pay | Admitting: Internal Medicine

## 2009-05-26 LAB — CONVERTED CEMR LAB
Blood Glucose, Fingerstick: 127
Microalb, Ur: 1.36 mg/dL (ref 0.00–1.89)

## 2009-07-02 ENCOUNTER — Ambulatory Visit (HOSPITAL_COMMUNITY): Admission: RE | Admit: 2009-07-02 | Discharge: 2009-07-02 | Payer: Self-pay | Admitting: Obstetrics & Gynecology

## 2009-07-06 ENCOUNTER — Ambulatory Visit: Payer: Self-pay | Admitting: Internal Medicine

## 2009-07-06 LAB — CONVERTED CEMR LAB: INR: 2.6

## 2009-07-07 ENCOUNTER — Ambulatory Visit: Payer: Self-pay | Admitting: Internal Medicine

## 2009-07-07 ENCOUNTER — Encounter (INDEPENDENT_AMBULATORY_CARE_PROVIDER_SITE_OTHER): Payer: Self-pay | Admitting: *Deleted

## 2009-07-13 LAB — CONVERTED CEMR LAB
Eosinophils Relative: 2 % (ref 0–5)
Ferritin: 10 ng/mL (ref 10–291)
HCT: 30.4 % — ABNORMAL LOW (ref 36.0–46.0)
Hemoglobin: 9.8 g/dL — ABNORMAL LOW (ref 12.0–15.0)
Iron: 50 ug/dL (ref 42–145)
Lymphocytes Relative: 42 % (ref 12–46)
MCHC: 32.2 g/dL (ref 30.0–36.0)
Monocytes Absolute: 0.7 10*3/uL (ref 0.1–1.0)
Monocytes Relative: 14 % — ABNORMAL HIGH (ref 3–12)
Neutro Abs: 2.1 10*3/uL (ref 1.7–7.7)
RBC: 3.39 M/uL — ABNORMAL LOW (ref 3.87–5.11)

## 2009-07-15 ENCOUNTER — Ambulatory Visit: Payer: Self-pay | Admitting: Internal Medicine

## 2009-08-31 ENCOUNTER — Ambulatory Visit: Payer: Self-pay | Admitting: Internal Medicine

## 2009-08-31 LAB — CONVERTED CEMR LAB: INR: 2.5

## 2009-10-05 ENCOUNTER — Telehealth (INDEPENDENT_AMBULATORY_CARE_PROVIDER_SITE_OTHER): Payer: Self-pay | Admitting: *Deleted

## 2009-10-05 ENCOUNTER — Encounter: Payer: Self-pay | Admitting: Internal Medicine

## 2009-10-06 ENCOUNTER — Encounter: Payer: Self-pay | Admitting: Internal Medicine

## 2009-10-06 ENCOUNTER — Inpatient Hospital Stay (HOSPITAL_COMMUNITY): Admission: EM | Admit: 2009-10-06 | Discharge: 2009-10-07 | Payer: Self-pay | Admitting: Emergency Medicine

## 2009-10-06 ENCOUNTER — Ambulatory Visit: Payer: Self-pay | Admitting: Cardiology

## 2009-10-07 ENCOUNTER — Encounter: Payer: Self-pay | Admitting: Internal Medicine

## 2009-10-12 ENCOUNTER — Ambulatory Visit: Payer: Self-pay | Admitting: Internal Medicine

## 2009-10-12 LAB — CONVERTED CEMR LAB: INR: 2.2

## 2009-10-22 ENCOUNTER — Ambulatory Visit: Payer: Self-pay | Admitting: Internal Medicine

## 2009-10-23 ENCOUNTER — Telehealth: Payer: Self-pay | Admitting: Internal Medicine

## 2009-10-23 LAB — CONVERTED CEMR LAB
BUN: 10 mg/dL (ref 6–23)
CO2: 24 meq/L (ref 19–32)
Calcium: 9.8 mg/dL (ref 8.4–10.5)
Creatinine, Ser: 0.98 mg/dL (ref 0.40–1.20)
Glucose, Bld: 80 mg/dL (ref 70–99)

## 2009-10-30 ENCOUNTER — Encounter (INDEPENDENT_AMBULATORY_CARE_PROVIDER_SITE_OTHER): Payer: Self-pay | Admitting: *Deleted

## 2009-10-30 ENCOUNTER — Ambulatory Visit: Payer: Self-pay | Admitting: Gastroenterology

## 2009-10-30 DIAGNOSIS — K5900 Constipation, unspecified: Secondary | ICD-10-CM | POA: Insufficient documentation

## 2009-11-02 ENCOUNTER — Encounter (INDEPENDENT_AMBULATORY_CARE_PROVIDER_SITE_OTHER): Payer: Self-pay | Admitting: *Deleted

## 2009-11-05 DIAGNOSIS — E538 Deficiency of other specified B group vitamins: Secondary | ICD-10-CM | POA: Insufficient documentation

## 2009-11-05 LAB — CONVERTED CEMR LAB
AST: 25 units/L (ref 0–37)
Alkaline Phosphatase: 77 units/L (ref 39–117)
Basophils Absolute: 0 10*3/uL (ref 0.0–0.1)
Bilirubin, Direct: 0.2 mg/dL (ref 0.0–0.3)
CO2: 29 meq/L (ref 19–32)
Calcium: 9.3 mg/dL (ref 8.4–10.5)
Ferritin: 10.3 ng/mL (ref 10.0–291.0)
Folate: 8.1 ng/mL
HCT: 30.2 % — ABNORMAL LOW (ref 36.0–46.0)
Lymphocytes Relative: 35.7 % (ref 12.0–46.0)
Lymphs Abs: 1.8 10*3/uL (ref 0.7–4.0)
Monocytes Relative: 14.6 % — ABNORMAL HIGH (ref 3.0–12.0)
Neutrophils Relative %: 47.3 % (ref 43.0–77.0)
Platelets: 176 10*3/uL (ref 150.0–400.0)
Potassium: 4.3 meq/L (ref 3.5–5.1)
RDW: 15.5 % — ABNORMAL HIGH (ref 11.5–14.6)
Saturation Ratios: 8.3 % — ABNORMAL LOW (ref 20.0–50.0)
Sodium: 141 meq/L (ref 135–145)
Total Protein: 7.6 g/dL (ref 6.0–8.3)
Transferrin: 285.1 mg/dL (ref 212.0–360.0)

## 2009-11-09 ENCOUNTER — Ambulatory Visit: Payer: Self-pay | Admitting: Gastroenterology

## 2009-11-16 ENCOUNTER — Ambulatory Visit: Payer: Self-pay | Admitting: Gastroenterology

## 2009-11-23 ENCOUNTER — Ambulatory Visit: Payer: Self-pay | Admitting: Gastroenterology

## 2009-11-27 ENCOUNTER — Emergency Department (HOSPITAL_COMMUNITY): Admission: EM | Admit: 2009-11-27 | Discharge: 2009-11-27 | Payer: Self-pay | Admitting: Emergency Medicine

## 2009-11-27 ENCOUNTER — Telehealth (INDEPENDENT_AMBULATORY_CARE_PROVIDER_SITE_OTHER): Payer: Self-pay | Admitting: Pharmacist

## 2009-11-30 ENCOUNTER — Ambulatory Visit: Payer: Self-pay | Admitting: Internal Medicine

## 2009-12-14 ENCOUNTER — Ambulatory Visit: Payer: Self-pay | Admitting: Gastroenterology

## 2009-12-16 ENCOUNTER — Encounter: Payer: Self-pay | Admitting: Gastroenterology

## 2009-12-25 ENCOUNTER — Ambulatory Visit: Payer: Self-pay | Admitting: Gastroenterology

## 2009-12-28 ENCOUNTER — Ambulatory Visit: Payer: Self-pay | Admitting: Internal Medicine

## 2009-12-28 LAB — CONVERTED CEMR LAB

## 2010-02-08 ENCOUNTER — Ambulatory Visit: Payer: Self-pay | Admitting: Internal Medicine

## 2010-02-10 ENCOUNTER — Ambulatory Visit: Payer: Self-pay | Admitting: Internal Medicine

## 2010-02-10 LAB — HM DIABETES FOOT EXAM

## 2010-02-10 LAB — CONVERTED CEMR LAB
Blood Glucose, Fingerstick: 116
Hgb A1c MFr Bld: 5.8 %

## 2010-02-15 ENCOUNTER — Ambulatory Visit: Payer: Self-pay | Admitting: Internal Medicine

## 2010-02-15 LAB — CONVERTED CEMR LAB: INR: 1.9

## 2010-03-29 ENCOUNTER — Ambulatory Visit: Payer: Self-pay | Admitting: Internal Medicine

## 2010-03-29 LAB — CONVERTED CEMR LAB

## 2010-03-30 ENCOUNTER — Ambulatory Visit: Payer: Self-pay | Admitting: Internal Medicine

## 2010-03-31 ENCOUNTER — Encounter: Payer: Self-pay | Admitting: Internal Medicine

## 2010-03-31 LAB — CONVERTED CEMR LAB
Ferritin: 16 ng/mL (ref 10–291)
Iron: 84 ug/dL (ref 42–145)

## 2010-04-02 LAB — CONVERTED CEMR LAB
Basophils Absolute: 0 10*3/uL (ref 0.0–0.1)
Basophils Relative: 1 % (ref 0–1)
MCHC: 32.9 g/dL (ref 30.0–36.0)
Neutro Abs: 2.8 10*3/uL (ref 1.7–7.7)
Neutrophils Relative %: 50 % (ref 43–77)
RDW: 17.3 % — ABNORMAL HIGH (ref 11.5–15.5)

## 2010-04-26 ENCOUNTER — Ambulatory Visit: Admission: RE | Admit: 2010-04-26 | Discharge: 2010-04-26 | Payer: Self-pay | Source: Home / Self Care

## 2010-05-09 DIAGNOSIS — Z8679 Personal history of other diseases of the circulatory system: Secondary | ICD-10-CM

## 2010-05-09 DIAGNOSIS — Z7901 Long term (current) use of anticoagulants: Secondary | ICD-10-CM

## 2010-05-09 DIAGNOSIS — I4891 Unspecified atrial fibrillation: Secondary | ICD-10-CM

## 2010-05-10 ENCOUNTER — Ambulatory Visit: Admission: RE | Admit: 2010-05-10 | Discharge: 2010-05-10 | Payer: Self-pay | Source: Home / Self Care

## 2010-05-10 LAB — CONVERTED CEMR LAB: INR: 2.1

## 2010-05-20 NOTE — Assessment & Plan Note (Signed)
Summary: [name]  Anticoagulant Therapy Managed by: [name]. [name]  PharmD CACP PCP: Zoila Shutter MD [facility] Attending: Josem Kaufmann MD, Lawrence Indication 1: TIA/CVA Indication 2: Aftercare long term use Anticoagulants V58.61, V58.83 Start date: 04/28/2000 Duration: Indefinite  Patient Assessment Reviewed by: [name] PharmD  April 27, 2009 Medication review: verified warfarin dosage & schedule,verified previous prescription medications, verified doses & any changes, verified new medications, reviewed OTC medications, reviewed OTC health products-vitamins supplements etc Complications: none Dietary changes: none   Health status changes: none   Lifestyle changes: none   Recent/future hospitalizations: none   Recent/future procedures: none   Recent/future dental: none Patient Assessment Part 2:  Have you MISSED ANY DOSES or CHANGED TABLETS?  No missed Warfarin doses or changed tablets.  Have you had any BRUISING or BLEEDING ( nose or gum bleeds,blood in urine or stool)?  No reported bruising or bleeding in nose, gums, urine, stool.  Have you STARTED or STOPPED any MEDICATIONS, including OTC meds,herbals or supplements?  No other medications or herbal supplements were started or stopped.  Have you CHANGED your DIET, especially green vegetables,or ALCOHOL intake?  No changes in diet or alcohol intake.  Have you had any ILLNESSES or HOSPITALIZATIONS?  No reported illnesses or hospitalizations  Have you had any signs of CLOTTING?(chest discomfort,dizziness,shortness of breath,arms tingling,slurred speech,swelling or redness in leg)    No chest discomfort, dizziness, shortness of breath, tingling in arm, slurred speech, swelling, or redness in leg.     Treatment  Target INR: 2.0-3.0 INR: 2.0  Date: 04/27/2009 Regimen In:  45.0mg /week INR reflects regimen in: 2.0  New  Tablet strength: : 5mg  Regimen Out:     Sunday: 1 & 1/2 Tablet     Monday: 1 Tablet     Tuesday: 1 &  1/2 Tablet     Wednesday: 1 & 1/2 Tablet     Thursday: 1 Tablet      Friday: 1 & 1/2 Tablet     Saturday: 1 & 1/2 Tablet Total Weekly: 47.5mg /week mg  Next INR Due: 05/18/2009 Adjusted by: [name]. [name] PharmD CACP   Return to anticoagulation clinic:  05/18/2009 Time of next visit: 1000    Allergies: No Known Drug Allergies

## 2010-05-20 NOTE — Assessment & Plan Note (Signed)
Summary: 261/cfb  Anticoagulant Therapy Managed by: [name]. [name]  PharmD CACP PCP: Zoila Shutter MD [facility] Attending: Coralee Pesa MD, Levada Schilling Indication 1: TIA/CVA Indication 2: Aftercare long term use Anticoagulants V58.61, V58.83 Start date: 04/28/2000 Duration: Indefinite  Patient Assessment Reviewed by: [name] PharmD  October 12, 2009 Medication review: verified warfarin dosage & schedule,verified previous prescription medications, verified doses & any changes, verified new medications, reviewed OTC medications, reviewed OTC health products-vitamins supplements etc Complications: none Dietary changes: none   Health status changes: none   Lifestyle changes: none   Recent/future hospitalizations: yes  Details: Recent hospitalization to the [facility]. Discharged on 5 days of ciprofloxicin. Recent/future procedures: none   Recent/future dental: none Patient Assessment Part 2:  Have you MISSED ANY DOSES or CHANGED TABLETS?  No missed Warfarin doses or changed tablets.  Have you had any BRUISING or BLEEDING ( nose or gum bleeds,blood in urine or stool)?  No reported bruising or bleeding in nose, gums, urine, stool.  Have you STARTED or STOPPED any MEDICATIONS, including OTC meds,herbals or supplements?  YES. Discharged from hospital after being admitted to [facility]. Discharged on ciprofloxicin, and enoxaparin--both of which have been completed.  Have you CHANGED your DIET, especially green vegetables,or ALCOHOL intake?  No changes in diet or alcohol intake.  Have you had any ILLNESSES or HOSPITALIZATIONS?  Yes. See.  Have you had any signs of CLOTTING?(chest discomfort,dizziness,shortness of breath,arms tingling,slurred speech,swelling or redness in leg)    No chest discomfort, dizziness, shortness of breath, tingling in arm, slurred speech, swelling, or redness in leg.     Treatment  Target INR: 2.0-3.0 INR: 2.2  Date: 10/12/2009 Regimen In:  50mg /wk INR reflects  regimen in: 2.2   Next INR Due: 10/20/2009 Adjusted by: [name]. [name] PharmD CACP   Return to anticoagulation clinic:  10/20/2009 Time of next visit: 1400    Allergies: No Known Drug Allergies

## 2010-05-20 NOTE — Assessment & Plan Note (Signed)
Summary: [name]  Anticoagulant Therapy Managed by: [name]. [name]  PharmD CACP PCP: [name], MD [name] Attending: Rogelia Boga MD, Lanora Manis Indication 1: TIA/CVA Indication 2: Aftercare long term use Anticoagulants V58.61, V58.83 Start date: 04/28/2000 Duration: Indefinite  Patient Assessment Reviewed by: [name] PharmD  April 26, 2010 Medication review: verified warfarin dosage & schedule,verified previous prescription medications, verified doses & any changes, verified new medications, reviewed OTC medications, reviewed OTC health products-vitamins supplements etc Complications: none Dietary changes: none   Health status changes: none   Lifestyle changes: none   Recent/future hospitalizations: none   Recent/future procedures: none   Recent/future dental: none Patient Assessment Part 2:  Have you MISSED ANY DOSES or CHANGED TABLETS?  No missed Warfarin doses or changed tablets.  Have you had any BRUISING or BLEEDING ( nose or gum bleeds,blood in urine or stool)?  No reported bruising or bleeding in nose, gums, urine, stool.  Have you STARTED or STOPPED any MEDICATIONS, including OTC meds,herbals or supplements?  No other medications or herbal supplements were started or stopped.  Have you CHANGED your DIET, especially green vegetables,or ALCOHOL intake?  No changes in diet or alcohol intake.  Have you had any ILLNESSES or HOSPITALIZATIONS?  No reported illnesses or hospitalizations  Have you had any signs of CLOTTING?(chest discomfort,dizziness,shortness of breath,arms tingling,slurred speech,swelling or redness in leg)    No chest discomfort, dizziness, shortness of breath, tingling in arm, slurred speech, swelling, or redness in leg.     Treatment  Target INR: 2.0-3.0 INR: 1.3  Date: 04/26/2010 Regimen In:  40.0mg /week INR reflects regimen in: 1.3  New  Tablet strength: : 5mg  Regimen Out:     Sunday: 1 Tablet     Monday: 1 & 1/2 Tablet     Tuesday: 1  & 1/2 Tablet     Wednesday: 1 & 1/2 Tablet     Thursday: 1 & 1/2 Tablet      Friday: 1 Tablet     Saturday: 1 Tablet Total Weekly: 45.0mg/week mg  Next INR Due: 05/10/2010 Adjusted by: James B. Groce III PharmD CACP   Return to anticoagulation clinic:  05/10/2010 Time of next visit: 0945    Allergies: No Known Drug Allergies Prescriptions: WARFARIN SODIUM 5 MG TABS (WARFARIN SODIUM) Per coumadin clinic  #50 x 02   Entered by:   Jay Groce PharmD   Authorized by:   Elizabeth Butcher MD   Signed by:   Jay Groce PharmD on 04/26/2010   Method used:   Electronically to        [facility] [id number] (retail)       [address]       [place], [place]  [address]       Ph: [phone]       Fax: [phone]   RxID:   [id number]

## 2010-05-20 NOTE — Procedures (Signed)
Summary: Upper Endoscopy  Patient: [name] Note: All result statuses are Final unless otherwise noted.  Tests: (1) Upper Endoscopy (EGD)   EGD Upper Endoscopy       DONE     [facility]     [address]     [place], [place]  [address]           ENDOSCOPY PROCEDURE REPORT           PATIENT:  Holly, Duran  MR#:  [id number]     BIRTHDATE:  26-Oct-1925, 84 yrs. old  GENDER:  female           ENDOSCOPIST:  Vania Rea. Jarold Motto, MD, [facility]     Referred by:           PROCEDURE DATE:  12/14/2009     PROCEDURE:  EGD, diagnostic     ASA CLASS:  Class III     INDICATIONS:  iron deficiency anemia, GERD           MEDICATIONS:   There was residual sedation effect present from     prior procedure., Versed 1 mg IV     TOPICAL ANESTHETIC:           DESCRIPTION OF PROCEDURE:   After the risks benefits and     alternatives of the procedure were thoroughly explained, informed     consent was obtained.  The LB [id number] endoscope was     introduced through the mouth and advanced to the second portion of     the duodenum, without limitations.  The instrument was slowly     withdrawn as the mucosa was fully examined.     <<PROCEDUREIMAGES>>           The upper, middle, and distal third of the esophagus were     carefully inspected and no abnormalities were noted. The z-line     was well seen at the GEJ. The endoscope was pushed into the fundus     which was normal including a retroflexed view. The antrum,gastric     body, first and second part of the duodenum were unremarkable.     Retroflexed views revealed no abnormalities.    The scope was then     withdrawn from the patient and the procedure completed.           COMPLICATIONS:  None           ENDOSCOPIC IMPRESSION:     1) Normal EGD     CHRONIC NONEROSIVE GERD ON PPI RX.     RECOMMENDATIONS:     1) continue current medications           REPEAT EXAM:  No           ______________________________     Vania Rea.  Jarold Motto, MD, Clementeen Graham           CC:  [name] MD           n.     Rosalie DoctorVania Rea. Patterson at 12/14/2009 10:47 AM           Ned Grace, [id number]  Note: An exclamation mark (!) indicates a result that was not dispersed into the flowsheet. Document Creation Date: 12/14/2009 10:49 AM _______________________________________________________________________  (1) Order result status: Final Collection or observation date-time: 12/14/2009 10:42 Requested date-time:  Receipt date-time:  Reported date-time:  Referring Physician:   Ordering Physician: Sheryn Bison [phone]) Specimen Source:  Source: Launa Grill Order  Number: [id number] Lab site:

## 2010-05-20 NOTE — Progress Notes (Signed)
  Phone Note Outgoing Call   Call placed by: [name] PharmD CACP Call placed to: Patient Action Taken: Phone Call Completed, Appt scheduled Reason for Call: Confirm/change Appt Details for Reason: Called to patient (answering maching [but will continue to try to get the patient or her daughter "live" on the telephone]) to indicate to her that she needs to come back to the Coumadin Clinic on MONDAY 18-JUL-11 at 10:30am.  Details of Action Taken: Phone call completed. Summary of Call: Called and left message indicating NEXT APPT MONDAY 18-Jul-11 at 1030h. Initial call taken by: [name] PharmD CACP

## 2010-05-20 NOTE — Progress Notes (Signed)
  Phone Note From Other Clinic   Caller: Provider Call For: [name] PharmD CACP Reason for Call: Need Referral Information, Medication Check, Diagnosis Check Request: Talk with Provider, Call Patient Action Taken: Phone Call Completed, Provider Notified, Patient called Details for Reason: Called to ED by Dr. Iantha Fallen for information regarding her warfarin therapy. Patient had fallen sustaining a hematoma on her left upper arm. Details of Complaint: INR = 5.52 Details of Action Taken: HOLD/OMIT warfarin x 2 days/doses (Today 12-Aug and Tommorrow 13-Aug-11). Take 5mg  warfarin on SUNDAY 14-Aug-11 and on MONDAY 15-Aug-11 at 0900---come to [facility] for INR. Written instructions given to the patient IN the ED. Initial call taken by: [name]

## 2010-05-20 NOTE — Assessment & Plan Note (Signed)
Summary: 10:00 am/cfb  Anticoagulant Therapy Managed by: [name]. Janie Morning  PharmD CACP PCP: [name], MD [facility] Attending: Onalee Hua MD, Manrique Indication 1: TIA/CVA Indication 2: Aftercare long term use Anticoagulants V58.61, V58.83 Start date: 04/28/2000 Duration: Indefinite  Patient Assessment Reviewed by: [name] PharmD  February 08, 2010 Medication review: verified warfarin dosage & schedule,verified previous prescription medications, verified doses & any changes, verified new medications, reviewed OTC medications, reviewed OTC health products-vitamins supplements etc Complications: none Dietary changes: none   Health status changes: none   Lifestyle changes: none   Recent/future hospitalizations: none   Recent/future procedures: none   Recent/future dental: none Patient Assessment Part 2:  Have you MISSED ANY DOSES or CHANGED TABLETS?  No missed Warfarin doses or changed tablets.  Have you had any BRUISING or BLEEDING ( nose or gum bleeds,blood in urine or stool)?  No reported bruising or bleeding in nose, gums, urine, stool.  Have you STARTED or STOPPED any MEDICATIONS, including OTC meds,herbals or supplements?  No other medications or herbal supplements were started or stopped.  Have you CHANGED your DIET, especially green vegetables,or ALCOHOL intake?  No changes in diet or alcohol intake.  Have you had any ILLNESSES or HOSPITALIZATIONS?  No reported illnesses or hospitalizations  Have you had any signs of CLOTTING?(chest discomfort,dizziness,shortness of breath,arms tingling,slurred speech,swelling or redness in leg)    No chest discomfort, dizziness, shortness of breath, tingling in arm, slurred speech, swelling, or redness in leg.     Treatment  Target INR: 2.0-3.0 INR: 5.0  Date: 02/08/2010 Regimen In:  45.0mg /week INR reflects regimen in: 5.0  New  Tablet strength: : 5mg  Regimen Out:     Sunday: 1 Tablet     Monday: 1 Tablet     Tuesday: 1  Tablet     Wednesday: 1 Tablet     Thursday: 1 Tablet      Friday: 1 Tablet     Saturday: 1 Tablet Total Weekly: 35.0mg /week mg  Next INR Due: 02/15/2010 Adjusted by: [name]. [name] PharmD CACP   Return to anticoagulation clinic:  02/15/2010 Time of next visit: 1000  Hold:  1 Days     Allergies: No Known Drug Allergies
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
## 2010-05-20 NOTE — Assessment & Plan Note (Signed)
Summary: 77 YR F/U  Medications Added WARFARIN SODIUM 5 MG TABS (WARFARIN SODIUM) Use as directed by Anticoagulation Clinic SLOW RELEASE IRON 47.5 MG CR-TABS (FERROUS SULFATE) Take one tablet by mouth once daily.      Allergies Added: NKDA  Visit Type:  Follow-up Primary Provider:  Zoila Shutter MD   History of Present Illness: [name] returns today for followup of her PPM and HTN.  She is a pleasant woman with a h/;o tachybrady syndrome, atrial fib, and a stroke for which she is now on coumadin.  She has done well in the past year except that she has been losing weight.  She thinks that her DM has been the reason for her weight loss.  No other complaints today.  Current Medications (verified): 1)  Lopressor 100 Mg Tabs (Metoprolol Tartrate) .... Take 1 Tablet By Mouth Two Times A Day 2)  Lasix 20 Mg Tabs (Furosemide) .... Take 1 Tablet By Mouth Once A Day 3)  Lisinopril 40 Mg  Tabs (Lisinopril) .... Take 1 Tablet By Mouth Once A Day 4)  Metformin Hcl 500 Mg Tabs (Metformin Hcl) .... Take Two Tabs By Mouth A Day 5)  Warfarin Sodium 5 Mg Tabs (Warfarin Sodium) .... Use As Directed By Anticoagulation Clinic 6)  Colace 100 Mg Caps (Docusate Sodium) .... Take 1 Capsule  By Mouth Two Times A Day As Needed For Constipation 7)  Vesicare 10 Mg Tabs (Solifenacin Succinate) .... Take 1 Tablet By Mouth Once A Day 8)  Norvasc 10 Mg Tabs (Amlodipine Besylate) .... Take 1 Tablet By Mouth Once A Day 9)  Omeprazole 20 Mg Tbec (Omeprazole) .... Take 1 Tablet By Mouth Once A Day 10)  Tramadol Hcl 50 Mg Tabs (Tramadol Hcl) .... Take One Tablet Every 6 Hours X 3 Days, Then Reduce To As Needed. 11)  Glycolax  Powd (Polyethylene Glycol 3350) .... Put One Capful in 8 Ounces of Water and Drink Daily or 2 Times A Day As Needed 12)  Slow Release Iron 47.5 Mg Cr-Tabs (Ferrous Sulfate) .... Take One Tablet By Mouth Once Daily.  Allergies (verified): No Known Drug Allergies  Past History:  Past  Medical History: Last updated: 03/03/2009 Allergic rhinitis Atrial fibrillation Congestive heart failure, diastolic dysfunction restrictive lung disease, mild , PFT's 1996 Diabetes mellitus, type II Hypertension Osteoporosis Cerebrovascular accident, R MCA, cardioembolic 2002 Urinary incontinence Anticoagulation therapy Sick Sinus syndrome, s/p Pacer Dr. [name] 2000 Onychomycosis Trichomonal urethritis, history Vaginal bleeding, atrophy, negative biopsy 2/07 Decline influenza vaccine 2/07, 11/10  Past Surgical History: Last updated: 04/19/2006 Cataract extraction Permanent pacemaker  Review of Systems  The patient denies chest pain, syncope, dyspnea on exertion, and peripheral edema.    Vital Signs:  Patient profile:   77 year old female Height:      65 inches Weight:      132 pounds BMI:     22.05 Pulse rate:   79 / minute BP sitting:   118 / 60  (left arm)  Vitals Entered By: Laurance Flatten CMA (July 15, 2009 10:05 AM)  Physical Exam  General:  Elderly, well developed, well nourished, in no acute distress.  HEENT: normal Neck: supple. No JVD. Carotids 2+ bilaterally no bruits Cor: IRIRR no rubs, gallops or murmur Lungs: CTA. Wheezes, rales, or rhonchi. Ab: soft, nontender. nondistended. No HSM. Good bowel sounds Ext: warm. no cyanosis, clubbing or edema Neuro: alert and oriented. Grossly nonfocal. affect pleasant    PPM Specifications Following MD:  Lewayne Bunting,  MD     PPM Vendor:  [employer]     PPM Model Number:  [id number]     PPM Serial Number:  [id number] PPM DOI:  04/01/1999      Lead 1    Location: RV     DOI: 04/01/1999     Model #: [id number]     Serial #: [id number]     Status: active  Magnet Response Rate:  BOL 85 ERI  65  Indications:  Sick sinus syndrome A-fib +coumadin   PPM Follow Up Remote Check?  No Battery Voltage:  2.76 V     Battery Est. Longevity:  2.5 years     Pacer Dependent:  No     Right Ventricle  Amplitude: 11.2 mV, Impedance: 682 ohms,  Threshold: 1.0 V at 0.4 msec  Episodes Coumadin:  Yes Ventricular High Rate:  3     Ventricular Pacing:  14.6%  Parameters Mode:  VVIR     Lower Rate Limit:  60     Upper Rate Limit:  120 Next Cardiology Appt Due:  12/17/2009 Tech Comments:  No parameter changes.  A-fib, + coumadiin.  16% total heart rates > 120 bpm.  ROV 6months clinic. Altha Harm, LPN  July 15, 2009 10:23 AM  MD Comments:  Agree with above.  Impression & Recommendations:  Problem # 1:  CARDIAC PACEMAKER IN SITU (ICD-V45.01) Her ppm is working normally today.  I will see her back in one year.  No programming changes were made today.  Problem # 2:  HYPERTENSION (ICD-401.9) Her blood pressure is well controlled.  Continue a low sodium diet. Her updated medication list for this problem includes:    Lopressor 100 Mg Tabs (Metoprolol tartrate) .Marland Kitchen... Take 1 tablet by mouth two times a day    Lasix 20 Mg Tabs (Furosemide) .Marland Kitchen... Take 1 tablet by mouth once a day    Lisinopril 40 Mg Tabs (Lisinopril) .Marland Kitchen... Take 1 tablet by mouth once a day    Norvasc 10 Mg Tabs (Amlodipine besylate) .Marland Kitchen... Take 1 tablet by mouth once a day  Problem # 3:  ATRIAL FIBRILLATION (ICD-427.31) Her ventricular rate is well controlled.  She will continue her beta blocker for now.   Her updated medication list for this problem includes:    Lopressor 100 Mg Tabs (Metoprolol tartrate) .Marland Kitchen... Take 1 tablet by mouth two times a day    Warfarin Sodium 5 Mg Tabs (Warfarin sodium) ..... Use as directed by anticoagulation clinic  Patient Instructions: 1)  Your physician recommends that you schedule a follow-up appointment in: 6 months with device clinic and 12 months with Dr [name]

## 2010-05-20 NOTE — Assessment & Plan Note (Signed)
Summary: @2  of 3 weekly B12/266.2/dfs  Nurse Visit   Allergies: No Known Drug Allergies  Medication Administration  Injection # 1:    Medication: Vit B12 1000 mcg    Diagnosis: B12 DEFICIENCY (ICD-266.2)    Route: IM    Site: R deltoid    Exp Date: 09/2011    Lot #: [id number]    Mfr: [employer]    Comments: pt to schedule next weekly b12 3 of 3 at front desk     Patient tolerated injection without complications    Given by: Chales Abrahams CMA Duncan Dull) (November 16, 2009 9:39 AM)  Orders Added: 1)  Vit B12 1000 mcg [[id number]]

## 2010-05-20 NOTE — Assessment & Plan Note (Signed)
Summary: #1 of 3 weekly B12/dfs/266.2  Nurse Visit   Allergies: No Known Drug Allergies  Medication Administration  Injection # 1:    Medication: Vit B12 1000 mcg    Diagnosis: B12 DEFICIENCY (ICD-266.2)    Route: IM    Site: L deltoid    Exp Date: 08/17/2011    Lot #: [id number]    Mfr: [employer]    Patient tolerated injection without complications    Given by: Harlow Mares CMA Duncan Dull) (November 09, 2009 9:31 AM)

## 2010-05-20 NOTE — Letter (Signed)
Summary: Diabetic Instructions  [facility]  [address] [place], [place] [address]   Phone: [phone]  Fax: [phone]    CHELLSIE GOMER 30-May-1925 MRN: [id number]   _X  _   ORAL DIABETIC MEDICATION INSTRUCTIONS  The day before your procedure:   Take your diabetic pill as you do normally  The day of your procedure:   Do not take your diabetic pill    We will check your blood sugar levels during the admission process and again in Recovery before discharging you home  ________________________________________________________________________  _  _   INSULIN (LONG ACTING) MEDICATION INSTRUCTIONS (Lantus, NPH, 70/30, Humulin, Novolin-N)   The day before your procedure:   Take  your regular evening dose    The day of your procedure:   Do not take your morning dose    _  _   INSULIN (SHORT ACTING) MEDICATION INSTRUCTIONS (Regular, Humulog, Novolog)   The day before your procedure:   Do not take your evening dose   The day of your procedure:   Do not take your morning dose   _  _   INSULIN PUMP MEDICATION INSTRUCTIONS  We will contact the physician managing your diabetic care for written dosage instructions for the day before your procedure and the day of your procedure.  Once we have received the instructions, we will contact you.

## 2010-05-20 NOTE — Assessment & Plan Note (Signed)
Summary: MONTHLY B12 SHOT...LSW.  Nurse Visit   Allergies: No Known Drug Allergies  Medication Administration  Injection # 1:    Medication: Vit B12 1000 mcg    Diagnosis: B12 DEFICIENCY (ICD-266.2)    Route: IM    Site: L deltoid    Exp Date: 10/17/2011    Lot #: [id number]    Mfr: [employer]    Comments: rx for nascobal sent to pharm she will see how much t cost and it the rx is too expensive she will call back to schedule her monthly b12 injection.     Patient tolerated injection without complications    Given by: Harlow Mares CMA Duncan Dull) (December 25, 2009 8:19 AM) Prescriptions: NASCOBAL 500 MCG/0.1ML SOLN (CYANOCOBALAMIN) one spray, one nostril, once a week  #1 x 6   Entered by:   Harlow Mares CMA (AAMA)   Authorized by:   Mardella Layman MD [facility]   Signed by:   Harlow Mares CMA (AAMA) on 12/25/2009   Method used:   Electronically to        [facility] [id number] (retail)       [address] [place], [place]  [address]       Ph: [phone]       Fax: [phone]   RxID:   [id number]

## 2010-05-20 NOTE — Assessment & Plan Note (Signed)
Summary: 261/CFB  Anticoagulant Therapy Managed by: [name]. Janie Morning  PharmD CACP PCP: Zoila Shutter MD [facility] Attending: Rogelia Boga MD, Lanora Manis Indication 1: TIA/CVA Indication 2: Aftercare long term use Anticoagulants V58.61, V58.83 Start date: 04/28/2000 Duration: Indefinite  Patient Assessment Reviewed by: [name] PharmD  July 06, 2009 Medication review: verified warfarin dosage & schedule,verified previous prescription medications, verified doses & any changes, verified new medications, reviewed OTC medications, reviewed OTC health products-vitamins supplements etc Complications: none Dietary changes: none   Health status changes: none   Lifestyle changes: none   Recent/future hospitalizations: none   Recent/future procedures: none   Recent/future dental: none Patient Assessment Part 2:  Have you MISSED ANY DOSES or CHANGED TABLETS?  No missed Warfarin doses or changed tablets.  Have you had any BRUISING or BLEEDING ( nose or gum bleeds,blood in urine or stool)?  No reported bruising or bleeding in nose, gums, urine, stool.  Have you STARTED or STOPPED any MEDICATIONS, including OTC meds,herbals or supplements?  No other medications or herbal supplements were started or stopped.  Have you CHANGED your DIET, especially green vegetables,or ALCOHOL intake?  No changes in diet or alcohol intake.  Have you had any ILLNESSES or HOSPITALIZATIONS?  No reported illnesses or hospitalizations  Have you had any signs of CLOTTING?(chest discomfort,dizziness,shortness of breath,arms tingling,slurred speech,swelling or redness in leg)    No chest discomfort, dizziness, shortness of breath, tingling in arm, slurred speech, swelling, or redness in leg.     Treatment  Target INR: 2.0-3.0 INR: 2.6  Date: 07/06/2009 Regimen In:  50mg /wk INR reflects regimen in: 2.6  New  Tablet strength: : 5mg  Regimen Out:     Sunday: 1 & 1/2 Tablet     Monday: 1 & 1/2 Tablet     Tuesday:  1 & 1/2 Tablet     Wednesday: 1 Tablet     Thursday: 1 & 1/2 Tablet      Friday: 1 & 1/2 Tablet     Saturday: 1 & 1/2 Tablet Total Weekly: 50mg /wk mg  Next INR Due: 08/03/2009 Adjusted by: [name]. [name] PharmD CACP   Return to anticoagulation clinic:  08/03/2009 Time of next visit: 1100    Allergies: No Known Drug Allergies

## 2010-05-20 NOTE — Assessment & Plan Note (Signed)
Summary: EST-CK/FU/MEDS/CFB   Vital Signs:  Patient profile:   77 year old female Height:      65 inches (165.10 cm) Weight:      122.4 pounds (55.64 kg) BMI:     20.44 Temp:     98.8 degrees F (37.11 degrees C) oral Pulse rate:   79 / minute BP sitting:   120 / 69  (left arm) Cuff size:   small  Vitals Entered By: Cynda Familia Duncan Dull) (February 10, 2010 1:46 PM) Is Patient Diabetic? Yes Did you bring your meter with you today? No Nutritional Status BMI of 19 -24 = normal CBG Result 116  Have you ever been in a relationship where you felt threatened, hurt or afraid?Unable to ask   Does patient need assistance? Functional Status Self care Ambulation Normal   Primary Care Provider:  [name], MD   History of Present Illness: 77 yr old who comes in for 3 1/2 month follow up. She has continued to lose weight during that time and is in fact down 78 more pounds to 122. She does say that she hardly has any appetite and that nothing tastes good to her. She and her daughter, who is with her, wonder about boost or ensure.  She did finally have a colonoscopy done in 8/11 which showed only polyps (tubular adenomas).  She continues to have iron deficiency anemia but has a very hard time tolerating the by mouth iron because it worsens her baseline constipation. She says that mirilax is not helpful at all.   Her BP is excellent today. She wants a flu shot today.   She has also gone to her yearly visit with her cardiologist and they interogated her pacemaker and it was fine.  They wonder if she can have something for pain. The pain is mainly in her hands, in the palmar fascia where nodules have formed due to dupytrens. Her daughter is sure she was told not to take tylenol-though I have told them I see no reason that she should stay away from it-so her daughter wonders if she can have something a little stronger.  Her HbA1c is 5.8 today.   Diabetic Foot Exam Last Podiatry Exam  Date: 05/26/2009 Foot Inspection Is there a history of a foot ulcer?              No Is there a foot ulcer now?              No Can the patient see the bottom of their feet?          Yes Are the shoes appropriate in style and fit?          No Is there swelling or an abnormal foot shape?          No Are the toenails long?                No Are the toenails thick?                Yes Are the toenails ingrown?              No Is there heavy callous build-up?              No  Diabetic Foot Care Education Patient educated on appropriate care of diabetic feet.  Pulse Check          Right Foot          Left Foot Posterior Tibial:  normal            normal Dorsalis Pedis:        normal            normal  High Risk Feet? Yes   10-g (5.07) Semmes-Weinstein Monofilament Test Performed by: Lynn Ito          Right Foot          Left Foot Test Control      normal         normal Site 1         abnormal         abnormal Site 2         abnormal         abnormal Site 3         abnormal         abnormal Site 4         abnormal         abnormal Site 5         abnormal         abnormal Site 6         abnormal         abnormal  Impression      abnormal         abnormal  Preventive Screening-Counseling & Management  Alcohol-Tobacco     Alcohol type: AT TIMES - BEER/WINE     Smoking Status: quit     Year Quit: 45 YEARS AGO  Current Medications (verified): 1)  Lopressor 100 Mg Tabs (Metoprolol Tartrate) .... Take 1 Tablet By Mouth Two Times A Day 2)  Lasix 20 Mg Tabs (Furosemide) .... Take 1 Tablet By Mouth Once A Day As Needed 3)  Lisinopril 40 Mg  Tabs (Lisinopril) .... Take 1 Tablet By Mouth Once A Day 4)  Metformin Hcl 500 Mg Tabs (Metformin Hcl) .... Take Two Tabs By Mouth A Day 5)  Vesicare 10 Mg Tabs (Solifenacin Succinate) .... Take 1 Tablet By Mouth Once A Day 6)  Omeprazole 20 Mg Tbec (Omeprazole) .... Take 1 Tablet By Mouth Once A Day 7)  Warfarin Sodium 5 Mg Tabs (Warfarin  Sodium) .... Per [facility] 8)  Miralax   Powd (Polyethylene Glycol 3350) .... Two Times A Day 9)  Nascobal 500 Mcg/0.41ml Soln (Cyanocobalamin) .... One Spray, One Nostril, Once A Week 10)  Tramadol Hcl 50 Mg Tabs (Tramadol Hcl) .... Take One Three Times A Day As Needed For Pain  Allergies (verified): No Known Drug Allergies  Past History:  Past Medical History: Reviewed history from 10/30/2009 and no changes required. Allergic rhinitis Atrial fibrillation Congestive heart failure, diastolic dysfunction restrictive lung disease, mild , PFT's 1996 Diabetes mellitus, type II Hypertension Osteoporosis Cerebrovascular accident, R MCA, cardioembolic 2002 Urinary incontinence Anticoagulation therapy Sick Sinus syndrome, s/p Pacer Dr. [name] 2000 Onychomycosis Trichomonal urethritis, history Vaginal bleeding, atrophy, negative biopsy 2/07 Decline influenza vaccine 2/07, 11/10 Anemia Arthritis  Past Surgical History: Reviewed history from 10/30/2009 and no changes required. Cataract extraction Permanent pacemaker Cholecystectomy Hysterectomy  Family History: Reviewed history from 10/30/2009 and no changes required. No FH of Colon Cancer: Family History of Diabetes: Sister  Social History: Reviewed history from 10/30/2009 and no changes required. widowed Retired Daughter  assists with care. lives at home and has 8 children (one deceased), many check on her   Patient is a former smoker.  Alcohol Use - yes--occasional Daily Caffeine Use Illicit Drug Use - no  Review of Systems General:  Complains of loss of appetite and weight loss. CV:  Denies chest pain or discomfort and palpitations. Resp:  Denies cough and shortness of breath. GI:  Complains of constipation; denies abdominal pain, nausea, and vomiting. Psych:  Denies depression.  Physical Exam  General:  In NAD, alert and well-developed.   Head:  normocephalic and atraumatic.   Eyes:  vision grossly  intact.   Neck:  supple and no masses.   Lungs:  normal respiratory effort and normal breath sounds.   Heart:  normal rate, regular rhythm, and no murmur.   Abdomen:  soft, non-tender, and normal bowel sounds.   Msk:  normal ROM.   Extremities:  trace ankle edema  Diabetes Management Exam:    Foot Exam (with socks and/or shoes not present):       Sensory-Monofilament:          Left foot: abnormal          Right foot: abnormal   Impression & Recommendations:  Problem # 1:  HYPERTENSION (ICD-401.9) Well controlled. Continue present regimen. The following medications were removed from the medication list:    Amlodipine Besylate 10 Mg Tabs (Amlodipine besylate) ..... One a day Her updated medication list for this problem includes:    Lopressor 100 Mg Tabs (Metoprolol tartrate) .Marland Kitchen... Take 1 tablet by mouth two times a day    Lasix 20 Mg Tabs (Furosemide) .Marland Kitchen... Take 1 tablet by mouth once a day as needed    Lisinopril 40 Mg Tabs (Lisinopril) .Marland Kitchen... Take 1 tablet by mouth once a day  Problem # 2:  DIABETES MELLITUS, TYPE II (ICD-250.00) With HbA1c I do not think that she needs the 1 metformin a day that she has been taking. I will stop this for now and follow . Her updated medication list for this problem includes:    Lisinopril 40 Mg Tabs (Lisinopril) .Marland Kitchen... Take 1 tablet by mouth once a day    Metformin Hcl 500 Mg Tabs (Metformin hcl) .Marland Kitchen... Take two tabs by mouth a day  Orders: T- Capillary Blood Glucose (82948) T-Hgb A1C (in-house) ([id number])  Problem # 3:  WEIGHT LOSS (ICD-783.21) I was most concerned about an abnormality in the colon with her also having iron deficiency anemia. However thankfully her colonoscopy looked fine. I suspect her weight loss is from anorexia. I have suggested that they supplement her diet with glycerna. I have suggested that she try to drink 3 a day, but to not substitute them for a meal.  I have also suggested that she try to eat whatever sounds good to  her.  Problem # 4:  OTHER CONSTIPATION (ICD-564.09) I have told patient if her constipation is significantly worsened with by mouth iron then she doesn't need to take. May need to consider IV iron. The following medications were removed from the medication list:    Glycolax Powd (Polyethylene glycol 3350) .Marland Kitchen... Put one capful in 8 ounces of water and drink daily or 2 times a day as needed    Dulcolax 10 Mg Supp (Bisacodyl) .Marland Kitchen... 1 q hs Her updated medication list for this problem includes:    Miralax Powd (Polyethylene glycol 3350) .Marland Kitchen..Marland Kitchen Two times a day  Problem # 5:  DEGENERATIVE JOINT DISEASE (ICD-715.90) Will try tramadol 50mg  three times a day as needed to see if that helps for her hand pain.  Her updated medication list for this problem includes:    Tramadol Hcl 50 Mg Tabs (Tramadol hcl) .Marland KitchenMarland KitchenMarland KitchenMarland Kitchen  Take one three times a day as needed for pain  Problem # 6:  Preventive Health Care (ICD-V70.0) She again refuses a flu shot.  Complete Medication List: 1)  Lopressor 100 Mg Tabs (Metoprolol tartrate) .... Take 1 tablet by mouth two times a day 2)  Lasix 20 Mg Tabs (Furosemide) .... Take 1 tablet by mouth once a day as needed 3)  Lisinopril 40 Mg Tabs (Lisinopril) .... Take 1 tablet by mouth once a day 4)  Metformin Hcl 500 Mg Tabs (Metformin hcl) .... Take two tabs by mouth a day 5)  Vesicare 10 Mg Tabs (Solifenacin succinate) .... Take 1 tablet by mouth once a day 6)  Omeprazole 20 Mg Tbec (Omeprazole) .... Take 1 tablet by mouth once a day 7)  Warfarin Sodium 5 Mg Tabs (Warfarin sodium) .... Per coumadin clinic 8)  Miralax Powd (Polyethylene glycol 3350) .... Two times a day 9)  Nascobal 500 Mcg/0.22ml Soln (Cyanocobalamin) .... One spray, one nostril, once a week 10)  Tramadol Hcl 50 Mg Tabs (Tramadol hcl) .... Take one three times a day as needed for pain  Patient Instructions: 1)  Please schedule a follow-up appointment in 6 weeks. 2)  Please try to drink at least 3 glycerna a day,  and try to find something that tastes good to you. 3)  We have stopped your metformin. 4)  I have started you on tramadol 50mg . You can take one 3 times a day as needed for pain.  5)  Happy Thanksgiving! Prescriptions: TRAMADOL HCL 50 MG TABS (TRAMADOL HCL) take one three times a day as needed for pain  #90 x 3   Entered and Authorized by:   Zoila Shutter MD   Signed by:   Zoila Shutter MD on 02/10/2010   Method used:   Electronically to        [facility] [id number] (retail)       [address] [place], [place]  [address]       Ph: [phone]       Fax: [phone]   RxID:   [id number]    Orders Added: 1)  T- Capillary Blood Glucose [82948] 2)  T-Hgb A1C (in-house) [83036QW] 3)  Est. Patient Level IV [10272]    Prevention & Chronic Care Immunizations   Influenza vaccine: Not documented   Influenza vaccine deferral: Refused  (03/03/2009)    Tetanus booster: Not documented   Td booster deferral: Refused  (05/26/2009)    Pneumococcal vaccine: Not documented   Pneumococcal vaccine deferral: Refused  (05/26/2009)    H. zoster vaccine: Not documented  Colorectal Screening   Hemoccult: Negative  (03/27/2001)   Hemoccult action/deferral: Ordered  (05/26/2009)    Colonoscopy: DONE  (12/14/2009)   Colonoscopy action/deferral: GI referral  (12/15/2008)   Colonoscopy due: 11/2012  Other Screening   Pap smear: Normal  (11/27/2003)   Pap smear action/deferral: Deferred  (12/15/2008)    Mammogram: ASSESSMENT: Negative - BI-RADS 1^MM DIGITAL SCREENING  (07/02/2009)   Mammogram action/deferral: Screening mammogram in 1 year.     (05/24/2007)    DXA bone density scan: Not documented  Reports requested:  Smoking status: quit  (02/10/2010)  Diabetes Mellitus   HgbA1C: 5.8  (02/10/2010)    Eye exam: Not documented   Last eye exam report requested.    Foot exam: yes  (02/10/2010)   Foot exam action/deferral: Do today  High  risk foot: Yes  (02/10/2010)   Foot care education: Done  (02/10/2010)    Urine microalbumin/creatinine ratio: 13.9  (05/26/2009)   Urine microalbumin action/deferral: Ordered  Lipids   Total Cholesterol: 153  (03/03/2009)   Lipid panel action/deferral: Lipid Panel ordered   LDL: 68  (03/03/2009)   LDL Direct: Not documented   HDL: 54  (03/03/2009)   Triglycerides: 153  (03/03/2009)  Hypertension   Last Blood Pressure: 120 / 69  (02/10/2010)   Serum creatinine: 1.1  (10/30/2009)   Serum potassium 4.3  (10/30/2009)  Self-Management Support :   Personal Goals (by the next clinic visit) :     Personal A1C goal: 7  (12/15/2008)     Personal blood pressure goal: 140/90  (12/15/2008)     Personal LDL goal: 100  (07/07/2009)    Patient will work on the following items until the next clinic visit to reach self-care goals:     Medications and monitoring: take my medicines every day, examine my feet every day  (02/10/2010)     Eating: drink diet soda or water instead of juice or soda, eat foods that are low in salt, eat baked foods instead of fried foods  (02/10/2010)     Activity: join a walking program  (07/07/2009)    Diabetes self-management support: Written self-care plan  (02/10/2010)   Diabetes care plan printed    Hypertension self-management support: Written self-care plan  (02/10/2010)   Hypertension self-care plan printed.   Nursing Instructions: Diabetic foot exam today Request report of last diabetic eye exam-[facility]   Laboratory Results   Blood Tests   Date/Time Received: February 10, 2010 2:05 PM  Date/Time Reported: Burke Keels  February 10, 2010 2:05 PM   HGBA1C: 5.8%   (Normal Range: Non-Diabetic - 3-6%   Control Diabetic - 6-8%) CBG Random:: 116mg /dL

## 2010-05-20 NOTE — Assessment & Plan Note (Signed)
Summary: iron def anemia, consult colon/lk   History of Present Illness Visit Type: consult Primary GI MD: Sheryn Bison MD FACP FAGA Primary Provider: [name], MD Chief Complaint: anemia, pt also states she is haivng difficulty swallowing pills, and constipation History of Present Illness:   This patient is an 77 year old American female with chronic atrial fibrillation, previous embolic stroke, chronic CHF, chronic  Coumadin therapy, who is referred for possible colonoscopy because of iron deficiency anemia.  She has had chronic constipation for many years and uses ex-lax and p.r.n. MiraLax. There is no history of abdominal pain, melena, hematochezia, or upper GI symptoms except for occasional reflux symptoms, and she is on daily omeprazole. What I can ascertain she's never had endoscopy or colonoscopy.  She is racially hospitalized on June 20 cause of coagulopathy requiring vitamin K replacement followed by Lovenox injections. An exact time she has mild congestive heart failure which was treated conservatively. Past history includes sick sinus syndrome with pacemaker placement in 2009 Dr. Gilman Schmidt. Patient also has well-controlled diabetes, but is not on insulin. Additional problems include COPD, urinary incontinency, osteoporosis, previous hysterectomy and cholecystectomy.  She currently denies any active cardiovascular pulmonary complaints but has limited exercise capacity. She denies use of alcohol, cigarettes, or NSAIDs. Family history is entirely noncontributory.   GI Review of Systems    Reports dysphagia with solids.      Denies abdominal pain, acid reflux, belching, bloating, chest pain, dysphagia with liquids, heartburn, loss of appetite, nausea, vomiting, vomiting blood, weight loss, and  weight gain.      Reports constipation.     Denies anal fissure, black tarry stools, change in bowel habit, diarrhea, diverticulosis, fecal incontinence, heme positive stool,  hemorrhoids, irritable bowel syndrome, jaundice, light color stool, liver problems, rectal bleeding, and  rectal pain. Preventive Screening-Counseling & Management  Alcohol-Tobacco     Smoking Status: quit      Drug Use:  no.      Current Medications (verified): 1)  Lopressor 100 Mg Tabs (Metoprolol Tartrate) .... Take 1 Tablet By Mouth Two Times A Day 2)  Lasix 20 Mg Tabs (Furosemide) .... Take 1 Tablet By Mouth Once A Day As Needed 3)  Lisinopril 40 Mg  Tabs (Lisinopril) .... Take 1 Tablet By Mouth Once A Day 4)  Metformin Hcl 500 Mg Tabs (Metformin Hcl) .... Take Two Tabs By Mouth A Day 5)  Vesicare 10 Mg Tabs (Solifenacin Succinate) .... Take 1 Tablet By Mouth Once A Day 6)  Omeprazole 20 Mg Tbec (Omeprazole) .... Take 1 Tablet By Mouth Once A Day 7)  Glycolax  Powd (Polyethylene Glycol 3350) .... Put One Capful in 8 Ounces of Water and Drink Daily or 2 Times A Day As Needed 8)  Slow Release Iron 47.5 Mg Cr-Tabs (Ferrous Sulfate) .... Take One Tablet By Mouth Once Daily. 9)  Warfarin Sodium 5 Mg Tabs (Warfarin Sodium) .... Per Coumadin Clinic  Allergies (verified): No Known Drug Allergies  Past History:  Past medical, surgical, family and social histories (including risk factors) reviewed for relevance to current acute and chronic problems.  Past Medical History: Allergic rhinitis Atrial fibrillation Congestive heart failure, diastolic dysfunction restrictive lung disease, mild , PFT's 1996 Diabetes mellitus, type II Hypertension Osteoporosis Cerebrovascular accident, R MCA, cardioembolic 2002 Urinary incontinence Anticoagulation therapy Sick Sinus syndrome, s/p Pacer Dr. [name] 2000 Onychomycosis Trichomonal urethritis, history Vaginal bleeding, atrophy, negative biopsy 2/07 Decline influenza vaccine 2/07, 11/10 Anemia Arthritis  Past Surgical History: Cataract extraction Permanent  pacemaker Cholecystectomy Hysterectomy  Family History: Reviewed history and  no changes required. No FH of Colon Cancer: Family History of Diabetes: Sister  Social History: Reviewed history from 05/26/2009 and no changes required. widowed Retired Daughter  assists with care. lives at home and has 8 children (one deceased), many check on her   Patient is a former smoker.  Alcohol Use - yes--occasional Daily Caffeine Use Illicit Drug Use - no Drug Use:  no  Review of Systems       The patient complains of hearing problems and swelling of feet/legs.  The patient denies allergy/sinus, anemia, anxiety-new, arthritis/joint pain, back pain, blood in urine, breast changes/lumps, confusion, cough, coughing up blood, depression-new, fainting, fatigue, fever, headaches-new, heart murmur, heart rhythm changes, itching, menstrual pain, muscle pains/cramps, night sweats, nosebleeds, pregnancy symptoms, shortness of breath, skin rash, sleeping problems, sore throat, swollen lymph glands, thirst - excessive, urination - excessive, urination changes/pain, urine leakage, vision changes, and voice change.   General:  Complains of fatigue and weakness; denies fever, chills, sweats, anorexia, malaise, weight loss, and sleep disorder. Eyes:  Denies blurring, diplopia, irritation, discharge, vision loss, scotoma, eye pain, and photophobia. ENT:  Complains of decreased hearing. CV:  Complains of palpitations, dyspnea on exertion, and peripheral edema; denies chest pains, angina, syncope, orthopnea, PND, and claudication. Resp:  Complains of dyspnea with exercise and wheezing; denies dyspnea at rest, cough, sputum, coughing up blood, and pleurisy. GI:  difficulty swallowing pills in the retropharyngeal area. She denies hepatobiliary complaints.. GU:  Complains of urinary frequency and urinary incontinence; denies urinary burning, blood in urine, nocturnal urination, abnormal vaginal bleeding, amenorrhea, menorrhagia, vaginal discharge, pelvic pain, genital sores, painful intercourse, and  decreased libido. MS:  Complains of joint swelling, joint stiffness, and joint deformity; denies joint pain / LOM, low back pain, muscle weakness, muscle cramps, muscle atrophy, leg pain at night, leg pain with exertion, and shoulder pain / LOM hand / wrist pain (CTS); previous fractures of her ankles with pin placement.. Derm:  Denies rash, itching, dry skin, hives, moles, warts, and unhealing ulcers. Neuro:  Complains of weakness and difficulty walking; denies paralysis, abnormal sensation, seizures, syncope, tremors, vertigo, transient blindness, frequent falls, frequent headaches, headache, sciatica, radiculopathy other:, restless legs, memory loss, and confusion. Psych:  Denies depression, anxiety, memory loss, suicidal ideation, hallucinations, paranoia, phobia, and confusion. Endo:  Denies cold intolerance, heat intolerance, polydipsia, polyphagia, polyuria, unusual weight change, and hirsutism. Heme:  Complains of bruising; denies bleeding, enlarged lymph nodes, and pagophagia. Allergy:  Denies hives, rash, sneezing, hay fever, and recurrent infections.  Vital Signs:  Patient profile:   77 year old female Height:      65 inches Weight:      130 pounds BMI:     21.71 Pulse rate:   80 / minute Pulse rhythm:   regular BP sitting:   130 / 66  (left arm) Cuff size:   regular  Vitals Entered By: Francee Piccolo CMA Duncan Dull) (October 30, 2009 8:38 AM)  Physical Exam  General:  Well developed, well nourished, no acute distress.very thin patient in no acute distress. Head:  Normocephalic and atraumatic. Eyes:  PERRLA, no icterus.exam deferred to patient's ophthalmologist.   Neck:  Supple; no masses or thyromegaly. Lungs:  decreased BS on L, decreased BS on R, and rhonchi bilateral.   Heart:  irregular rhythm: and heart murmur systolic:.   Abdomen:  Soft, nontender and nondistended. No masses, hepatosplenomegaly or hernias noted. Normal bowel sounds. Rectal:  Normal  exam.hemocult negative.   hard impacted stool in the rectal vault. Msk:  Symmetrical with no gross deformities. Normal posture.decreased ROM and arthritic changes.   Extremities:  No clubbing, cyanosis, edema or deformities noted.trace pedal edema.   Neurologic:  Alert and  oriented x4;  grossly normal neurologically.   Impression & Recommendations:  Problem # 1:  OTHER CONSTIPATION (ICD-564.09) Assessment Deteriorated Colonoscopy scheduled her convenience. I would suggest that we hold Coumadin 5 days before this procedure with Lovenox coverage as an outpatient. Her daughter was with her today and is previously given her Lovenox injections. Her history is positive for previous thromboembolic phenomenon. She is in chronic atrial fibrillation but has a pacemaker, cardiac rate seems well controlled, and she does not seem to be in any congestive heart failure at this time. I think her lower extremity edema is probably related to incompetent valves in her legs. Apparently her see her albumin level has been normal, but we will repeat her labs. Stool today was guaiac negative. I have asked her to use MiraLax b.i.d. with p.r.n. Dulcolax suppositories and p.r.n. Next her constipation. Thyroid function tests also been ordered to exclude hyperthyroidism. TLB-CBC Platelet - w/Differential (85025-CBCD) TLB-BMP (Basic Metabolic Panel-BMET) (80048-METABOL) TLB-Hepatic/Liver Function Pnl (80076-HEPATIC) TLB-TSH (Thyroid Stimulating Hormone) (84443-TSH) TLB-B12, Serum-Total ONLY ([address]-U04) TLB-Ferritin (82728-FER) TLB-Folic Acid (Folate) (82746-FOL) TLB-IBC Pnl (Iron/FE;Transferrin) (83550-IBC)  Problem # 2:  OTHER AND UNSPECIFIED COAGULATION DEFECTS (ICD-286.9) Assessment: Unchanged we will consult Dr. Lewayne Bunting and Dr. [name] in the Coumadin clinic her adjusting these medications for her endoscopic procedures. She should tolerated a balanced electrolyte solution preparation. Adjustments also will need to be made in her  diabetic medications before the procedure. Orders: TLB-CBC Platelet - w/Differential (85025-CBCD) TLB-BMP (Basic Metabolic Panel-BMET) (80048-METABOL) TLB-Hepatic/Liver Function Pnl (80076-HEPATIC) TLB-TSH (Thyroid Stimulating Hormone) (84443-TSH) TLB-B12, Serum-Total ONLY (54098-J19) TLB-Ferritin (82728-FER) TLB-Folic Acid (Folate) (82746-FOL) TLB-IBC Pnl (Iron/FE;Transferrin) (83550-IBC)  Problem # 3:  EDEMA (ICD-782.3) Assessment: Improved Continue all medications as outlined in her record per cardiology and primary care.  Problem # 4:  ATRIAL FIBRILLATION (ICD-427.31) Assessment: Unchanged CONTINUE all meds including lisinopril and Lopressor.  Problem # 5:  CARDIAC PACEMAKER IN SITU (ICD-V45.01) Assessment: Unchanged  Problem # 6:  ANEMIA, IRON DEFICIENCY (ICD-280.9) Assessment: Unchanged Continue oral iron therapy with repeat labs. Colonoscopy scheduled and possible endoscopy. Orders: TLB-CBC Platelet - w/Differential (85025-CBCD) TLB-BMP (Basic Metabolic Panel-BMET) (80048-METABOL) TLB-Hepatic/Liver Function Pnl (80076-HEPATIC) TLB-TSH (Thyroid Stimulating Hormone) (84443-TSH) TLB-B12, Serum-Total ONLY (14782-N56) TLB-Ferritin (82728-FER) TLB-Folic Acid (Folate) (82746-FOL) TLB-IBC Pnl (Iron/FE;Transferrin) (83550-IBC)  Patient Instructions: 1)  Please go to the basement for lab work. 2)  Increase Miralax to  two times a day. 3)  Begin Dulcolax suppository at bedtime. 4)  You will be scheduled for a colonoscopy in a few weeks. 5)  The medication list was reviewed and reconciled.  All changed / newly prescribed medications were explained.  A complete medication list was provided to the patient / caregiver. 6)  Copy sent to : Dr. Lind Guest and Dr. [name] in Coumadin clinic 7)  Please continue current medications.  8)  Constipation and Hemorrhoids brochure given.  9)  Colonoscopy and Flexible Sigmoidoscopy brochure given.  10)  Conscious Sedation brochure given.    11)  Upper Endoscopy brochure given.   Appended Document: iron def anemia, consult colon/lk    Clinical Lists Changes  Medications: Added new medication of MOVIPREP 100 GM  SOLR (PEG-KCL-NACL-NASULF-NA ASC-C) As per prep instructions. - Signed Added new medication of DULCOLAX 10  MG  SUPP (BISACODYL) 1 q hs - Signed Added new medication of MIRALAX   POWD (POLYETHYLENE GLYCOL 3350) two times a day Rx of MOVIPREP 100 GM  SOLR (PEG-KCL-NACL-NASULF-NA ASC-C) As per prep instructions.;  #1 x 0;  Signed;  Entered by: Ashok Cordia RN;  Authorized by: Mardella Layman MD [facility];  Method used: Electronically to [facility] [address] [place], [place], [place]  [address], Ph: [phone], Fax: [phone] Rx of DULCOLAX 10 MG  SUPP (BISACODYL) 1 q hs;  #30 x 3;  Signed;  Entered by: Ashok Cordia RN;  Authorized by: Mardella Layman MD [facility];  Method used: Electronically to [facility] [address] [place], [place], [place]  [address], Ph: [phone], Fax: [phone] Orders: Added new Test order of Colon/Endo (Colon/Endo) - Signed    Prescriptions: DULCOLAX 10 MG  SUPP (BISACODYL) 1 q hs  #30 x 3   Entered by:   Ashok Cordia RN   Authorized by:   Mardella Layman MD [facility]   Signed by:   Ashok Cordia RN on 10/30/2009   Method used:   Electronically to        [facility] [address] (retail)       [address] [place], [place]  [address]       Ph: [phone]       Fax: [phone]   RxID:   [id number] MOVIPREP 100 GM  SOLR (PEG-KCL-NACL-NASULF-NA ASC-C) As per prep instructions.  #1 x 0   Entered by:   Ashok Cordia RN   Authorized by:   Mardella Layman MD [facility]   Signed by:   Ashok Cordia RN on 10/30/2009   Method used:   Electronically to        [facility] [address] (retail)       [address] [place], [place]  [address]       Ph: [phone]       Fax:  [phone]   RxID:   [id number]

## 2010-05-20 NOTE — Assessment & Plan Note (Signed)
Summary: [name]  Anticoagulant Therapy Managed by: [name]. [name]  PharmD CACP PCP: [name], MD [facility] Attending: Margarito Liner MD Indication 1: TIA/CVA Indication 2: Aftercare long term use Anticoagulants V58.61, V58.83 Start date: 04/28/2000 Duration: Indefinite  Patient Assessment Reviewed by: [name] PharmD  December 28, 2009 Medication review: verified warfarin dosage & schedule,verified previous prescription medications, verified doses & any changes, verified new medications, reviewed OTC medications, reviewed OTC health products-vitamins supplements etc Complications: none Dietary changes: none   Health status changes: none   Lifestyle changes: none   Recent/future hospitalizations: none   Recent/future procedures: none   Recent/future dental: none Patient Assessment Part 2:  Have you MISSED ANY DOSES or CHANGED TABLETS?  No missed Warfarin doses or changed tablets.  Have you had any BRUISING or BLEEDING ( nose or gum bleeds,blood in urine or stool)?  No reported bruising or bleeding in nose, gums, urine, stool.  Have you STARTED or STOPPED any MEDICATIONS, including OTC meds,herbals or supplements?  No other medications or herbal supplements were started or stopped.  Have you CHANGED your DIET, especially green vegetables,or ALCOHOL intake?  No changes in diet or alcohol intake.  Have you had any ILLNESSES or HOSPITALIZATIONS?  No reported illnesses or hospitalizations  Have you had any signs of CLOTTING?(chest discomfort,dizziness,shortness of breath,arms tingling,slurred speech,swelling or redness in leg)    No chest discomfort, dizziness, shortness of breath, tingling in arm, slurred speech, swelling, or redness in leg.     Treatment  Target INR: 2.0-3.0 INR: 2.7  Date: 12/28/2009 Regimen In:  45.0mg /week INR reflects regimen in: 2.7  New  Tablet strength: : 5mg  Regimen Out:     Sunday: 1 & 1/2 Tablet     Monday: 1 Tablet     Tuesday: 1 &  1/2 Tablet     Wednesday: 1 Tablet     Thursday: 1 & 1/2 Tablet      Friday: 1 Tablet     Saturday: 1 & 1/2 Tablet Total Weekly: 45.0mg /week mg  Next INR Due: 01/25/2010 Adjusted by: [name]. [name] PharmD CACP   Return to anticoagulation clinic:  01/25/2010 Time of next visit: 0930    Allergies: No Known Drug Allergies

## 2010-05-20 NOTE — Assessment & Plan Note (Signed)
Summary: 261/ds  Anticoagulant Therapy Managed by: [name]. Janie Morning  PharmD CACP PCP: [name], MD [facility] Attending: Coralee Pesa MD, Levada Schilling Indication 1: TIA/CVA Indication 2: Aftercare long term use Anticoagulants V58.61, V58.83 Start date: 04/28/2000 Duration: Indefinite  Patient Assessment Reviewed by: [name] PharmD  November 30, 2009 Medication review: verified warfarin dosage & schedule,verified previous prescription medications, verified doses & any changes, verified new medications, reviewed OTC medications, reviewed OTC health products-vitamins supplements etc Complications: none Dietary changes: none   Health status changes: none   Lifestyle changes: none   Recent/future hospitalizations: none   Recent/future procedures: none   Recent/future dental: none Patient Assessment Part 2:  Have you MISSED ANY DOSES or CHANGED TABLETS?  YES. Missed/OMITTED 2 doses as was instructed based upon an INR 5.52 on Friday of last week--in the ED for left upper extremity hematoma as result of fall.  Have you had any BRUISING or BLEEDING ( nose or gum bleeds,blood in urine or stool)?  No reported bruising or bleeding in nose, gums, urine, stool.  Have you STARTED or STOPPED any MEDICATIONS, including OTC meds,herbals or supplements?  No other medications or herbal supplements were started or stopped.  Have you CHANGED your DIET, especially green vegetables,or ALCOHOL intake?  No changes in diet or alcohol intake.  Have you had any ILLNESSES or HOSPITALIZATIONS?  YES. In ED this past Friday with INR = 5.52  Have you had any signs of CLOTTING?(chest discomfort,dizziness,shortness of breath,arms tingling,slurred speech,swelling or redness in leg)    No chest discomfort, dizziness, shortness of breath, tingling in arm, slurred speech, swelling, or redness in leg.     Treatment  Target INR: 2.0-3.0 INR: 3.0  Date: 11/30/2009 Regimen In:  47.5mg /week INR reflects regimen in:  3.0  New  Tablet strength: : 5mg  Regimen Out:     Sunday: 1 & 1/2 Tablet     Monday: 1 Tablet     Tuesday: 1 & 1/2 Tablet     Wednesday: 1 Tablet     Thursday: 1 & 1/2 Tablet      Friday: 1 Tablet     Saturday: 1 & 1/2 Tablet Total Weekly: 45.0mg /week mg  Next INR Due: 12/28/2009 Adjusted by: [name]. [name] PharmD CACP   Return to anticoagulation clinic:  12/28/2009 Time of next visit: 0945   Comments: Will have enodoscopic exams on 29-Aug-11. Plan (sent to [facility]/discussed/agreed upon with Dr. Coralee Pesa) is to discontinue warfarin 5 days prior to procedure. Recommence day following or based upon instructions from GI Medicine.  Allergies: No Known Drug Allergies

## 2010-05-20 NOTE — Assessment & Plan Note (Signed)
Summary: 261/ds  Anticoagulant Therapy Managed by: [name]. [name]  PharmD CACP PCP: [name], MD [facility] Attending: Onalee Hua MD, Manrique Indication 1: TIA/CVA Indication 2: Aftercare long term use Anticoagulants V58.61, V58.83 Start date: 04/28/2000 Duration: Indefinite  Patient Assessment Reviewed by: [name] PharmD  March 29, 2010 Medication review: verified warfarin dosage & schedule,verified previous prescription medications, verified doses & any changes, verified new medications, reviewed OTC medications, reviewed OTC health products-vitamins supplements etc Complications: none Dietary changes: none   Health status changes: none   Lifestyle changes: none   Recent/future hospitalizations: none   Recent/future procedures: none   Recent/future dental: none Patient Assessment Part 2:  Have you MISSED ANY DOSES or CHANGED TABLETS?  No missed Warfarin doses or changed tablets.  Have you had any BRUISING or BLEEDING ( nose or gum bleeds,blood in urine or stool)?  No reported bruising or bleeding in nose, gums, urine, stool.  Have you STARTED or STOPPED any MEDICATIONS, including OTC meds,herbals or supplements?  No other medications or herbal supplements were started or stopped.  Have you CHANGED your DIET, especially green vegetables,or ALCOHOL intake?  No changes in diet or alcohol intake.  Have you had any ILLNESSES or HOSPITALIZATIONS?  No reported illnesses or hospitalizations  Have you had any signs of CLOTTING?(chest discomfort,dizziness,shortness of breath,arms tingling,slurred speech,swelling or redness in leg)    No chest discomfort, dizziness, shortness of breath, tingling in arm, slurred speech, swelling, or redness in leg.     Treatment  Target INR: 2.0-3.0 INR: 2.3  Date: 03/29/2010 Regimen In:  40.0mg /week INR reflects regimen in: 2.3  New  Tablet strength: : 5mg  Regimen Out:     Sunday: 1 Tablet     Monday: 1 & 1/2 Tablet     Tuesday: 1  Tablet     Wednesday: 1 Tablet     Thursday: 1 & 1/2 Tablet      Friday: 1 Tablet     Saturday: 1 Tablet Total Weekly: 40.0mg /week mg  Next INR Due: 04/26/2010 Adjusted by: [name]. [name] PharmD CACP   Return to anticoagulation clinic:  04/26/2010 Time of next visit: 0945    Allergies: No Known Drug Allergies

## 2010-05-20 NOTE — Procedures (Signed)
Summary: Colonoscopy  Patient: [name] Note: All result statuses are Final unless otherwise noted.  Tests: (1) Colonoscopy (COL)   COL Colonoscopy           DONE     [facility]     [address]     [place], [place]  [address]           COLONOSCOPY PROCEDURE REPORT           PATIENT:  [name], [name]  MR#:  [id number]     BIRTHDATE:  09/08/1925, 84 yrs. old  GENDER:  female     ENDOSCOPIST:  Vania Rea. Jarold Motto, MD, [facility]     REF. BY:     PROCEDURE DATE:  12/14/2009     PROCEDURE:  Colonoscopy with biopsy and snare polypectomy,     Colonoscopy with snare polypectomy     ASA CLASS:  Class III     INDICATIONS:  constipation, Iron deficiency anemia     MEDICATIONS:   Fentanyl 37.5 mcg IV, Versed 4 mg IV           DESCRIPTION OF PROCEDURE:   After the risks benefits and     alternatives of the procedure were thoroughly explained, informed     consent was obtained.  Digital rectal exam was performed and     revealed no abnormalities.   The [id number] endoscope was     introduced through the anus and advanced to the cecum, which was     identified by both the appendix and ileocecal valve, limited by a     redundant colon.    The quality of the prep was Moviprep fair.     The instrument was then slowly withdrawn as the colon was fully     examined.     <<PROCEDUREIMAGES>>           FINDINGS:  A sessile polyp was found at the splenic flexure. It     was fleshy and hemorrhagic. LARGE VILLOUS ADENOMA PIECEMEAL     REMOVED.SIZE GREATER THAN 5 CM.JAR#1.  There were multiple polyps     identified and removed. in the sigmoid to descending colon     segments. GREATER THAN 10 FLAT 7-12 MM FLAT POLYPS HOT SNARE     EXCISED.JAR #2.  Severe diverticulosis was found throughout the     colon.   Retroflexed views in the rectum revealed no     abnormalities.    The scope was then withdrawn from the patient     and the procedure completed.           COMPLICATIONS:  None  ENDOSCOPIC IMPRESSION:     1) Sessile polyp at the splenic flexure     2) Polyps, multiple in the sigmoid to descending colon segments           3) Severe diverticulosis throughout the colon     R/O CA.     RECOMMENDATIONS:     1) Await biopsy results     2) Resume Coumadin (warfarin) today and have your PT/INR checked     within 1 week.     REPEAT EXAM:  No           ______________________________     Vania Rea. Jarold Motto, MD, [facility]           CC:  [name] MD           n.     Rosalie DoctorOnalee Hua  Hale Bogus at 12/14/2009 10:38 AM           Ned Grace, [id number]  Note: An exclamation mark (!) indicates a result that was not dispersed into the flowsheet. Document Creation Date: 12/14/2009 10:39 AM _______________________________________________________________________  (1) Order result status: Final Collection or observation date-time: 12/14/2009 10:29 Requested date-time:  Receipt date-time:  Reported date-time:  Referring Physician:   Ordering Physician: Sheryn Bison [id number]) Specimen Source:  Source: Launa Grill Order Number: [id number] Lab site:   Appended Document: Colonoscopy     Procedures Next Due Date:    Colonoscopy: 11/2012

## 2010-05-20 NOTE — Assessment & Plan Note (Signed)
Summary: [name]  Anticoagulant Therapy Managed by: [name]. [name]  PharmD CACP PCP: Zoila Shutter MD [facility] Attending: [name], Beth Indication 1: TIA/CVA Indication 2: Aftercare long term use Anticoagulants V58.61, V58.83 Start date: 04/28/2000 Duration: Indefinite  Patient Assessment Reviewed by: [name] PharmD  Aug 31, 2009 Medication review: verified warfarin dosage & schedule,verified previous prescription medications, verified doses & any changes, verified new medications, reviewed OTC medications, reviewed OTC health products-vitamins supplements etc Complications: none Dietary changes: none   Health status changes: none   Lifestyle changes: none   Recent/future hospitalizations: none   Recent/future procedures: none   Recent/future dental: none Patient Assessment Part 2:  Have you MISSED ANY DOSES or CHANGED TABLETS?  No missed Warfarin doses or changed tablets.  Have you had any BRUISING or BLEEDING ( nose or gum bleeds,blood in urine or stool)?  No reported bruising or bleeding in nose, gums, urine, stool.  Have you STARTED or STOPPED any MEDICATIONS, including OTC meds,herbals or supplements?  No other medications or herbal supplements were started or stopped.  Have you CHANGED your DIET, especially green vegetables,or ALCOHOL intake?  No changes in diet or alcohol intake.  Have you had any ILLNESSES or HOSPITALIZATIONS?  No reported illnesses or hospitalizations  Have you had any signs of CLOTTING?(chest discomfort,dizziness,shortness of breath,arms tingling,slurred speech,swelling or redness in leg)    No chest discomfort, dizziness, shortness of breath, tingling in arm, slurred speech, swelling, or redness in leg.     Treatment  Target INR: 2.0-3.0 INR: 2.5  Date: 08/31/2009 Regimen In:  50mg /wk INR reflects regimen in: 2.5  New  Tablet strength: : 5mg  Regimen Out:     Sunday: 1 & 1/2 Tablet     Monday: 1 & 1/2 Tablet     Tuesday: 1 & 1/2  Tablet     Wednesday: 1 Tablet     Thursday: 1 & 1/2 Tablet      Friday: 1 & 1/2 Tablet     Saturday: 1 & 1/2 Tablet Total Weekly: 50mg /wk mg  Next INR Due: 09/28/2009 Adjusted by: [name]. [name] PharmD CACP   Return to anticoagulation clinic:  09/28/2009 Time of next visit: 1030    Allergies: No Known Drug Allergies

## 2010-05-20 NOTE — Letter (Signed)
Summary: New Patient letter  [facility]  [address] [place], [place] [address]   Phone: [phone]  Fax: [phone]       07/07/2009 MRN: [id number]  [facility] [address] [place], [place]  [address]  Dear Ms. Rothman,  Welcome to the Gastroenterology Division at [facility].    You are scheduled to see Dr.  Leone Payor on 07-30-09 at 9am on the 3rd floor at  [facility], [address]  We ask that you try to arrive at our office 15 minutes prior to your appointment time to allow for check-in.  We would like you to complete the enclosed self-administered evaluation form prior to your visit and bring it with you on the day of your appointment.  We will review it with you.  Also, please bring a complete list of all your medications or, if you prefer, bring the medication bottles and we will list them.  Please bring your insurance card so that we may make a copy of it.  If your insurance requires a referral to see a specialist, please bring your referral form from your primary care physician.  Co-payments are due at the time of your visit and may be paid by cash, check or credit card.     Your office visit will consist of a consult with your physician (includes a physical exam), any laboratory testing he/she may order, scheduling of any necessary diagnostic testing (e.g. x-ray, ultrasound, CT-scan), and scheduling of a procedure (e.g. Endoscopy, Colonoscopy) if required.  Please allow enough time on your schedule to allow for any/all of these possibilities.    If you cannot keep your appointment, please call [phone] to cancel or reschedule prior to your appointment date.  This allows [place] the opportunity to schedule an appointment for another patient in need of care.  If you do not cancel or reschedule by 5 p.m. the business day prior to your appointment date, you will be charged a $50.00 late cancellation/no-show fee.    Thank you for choosing [facility] for your medical needs.  We appreciate the opportunity to care for you.  Please visit [place] at our website  to learn more about our practice.                     Sincerely,                                                             The Gastroenterology Division

## 2010-05-20 NOTE — Progress Notes (Signed)
°----   Converted from flag ---- ---- 03/24/2008 11:08 AM, Shon Hough wrote: This pt has been sch an appointment for a BMET on 03/31/2008 at 11:00am.  She is also sch for her coumadin on that day at 10:00am as well.  ---- 02/18/2008 2:41 PM, Manning Charity MD wrote: Please don't get mad.  Ms. Lurie needs an appt for a BMET because of meds she is on.  THANK YOU!!!! ------------------------------

## 2010-05-20 NOTE — Progress Notes (Signed)
  Phone Note From Other Clinic   Caller: Nurse Call For: [name] PharmD CACP Reason for Call: Schedule Patient Appt, Medication Check, Diagnosis Check Request: Talk with Provider, Call Patient Action Taken: Phone Call Completed, Provider Notified, Patient called Details for Reason: Received fax form from [facility] indicating patient will have endoscopic exam on 29-Aug-11 and they are seeking advice on her anticoagulation relative to her AFib. Details of Complaint: Discussed with Dr. Coralee Pesa. Will HOLD/OMIT warfarin for 5 days prior to planned endoscopic exam. Will recommence warfarin AFTER exam. Details of Action Taken: Discussed with Dr. Coralee Pesa; Faxed instructions to [facility]; and called patients daughter and advised her of same. Summary of Call: Discontinuation of warfarin around endoscopic exam on 29-Aug-11. Initial call taken by: [name] PharmD CACP

## 2010-05-20 NOTE — Procedures (Signed)
Summary: pacer check.amber      Allergies Added: NKDA  Current Medications (verified): 1)  Lopressor 100 Mg Tabs (Metoprolol Tartrate) .... Take 1 Tablet By Mouth Two Times A Day 2)  Lasix 20 Mg Tabs (Furosemide) .... Take 1 Tablet By Mouth Once A Day 3)  Lisinopril 40 Mg  Tabs (Lisinopril) .... Take 1 Tablet By Mouth Once A Day 4)  Metformin Hcl 1000 Mg Tabs (Metformin Hcl) .... Take 1 Tablet By Mouth Two Times A Day 5)  Coumadin 5 Mg Tabs (Warfarin Sodium) .... Take One and A Half Tablets By Mouth Daily. 6)  Tylenol Arthritis Pain 650 Mg  Tbcr (Acetaminophen) .... Take One Tablet By Mouth Every Four Hours As Needed For Pain. 7)  Colace 100 Mg Caps (Docusate Sodium) .... Take 1 Capsule  By Mouth Two Times A Day As Needed For Constipation 8)  Vesicare 10 Mg Tabs (Solifenacin Succinate) .... Take 1 Tablet By Mouth Once A Day 9)  Darvocet-N 100 100-650 Mg Tabs (Propoxyphene N-Apap) .... Take 1 Tablet By Mouth Every Four Hours As Needed For Pain 10)  Norvasc 10 Mg Tabs (Amlodipine Besylate) .... Take 1 Tablet By Mouth Once A Day 11)  Omeprazole 20 Mg Tbec (Omeprazole) .... Take 1 Tablet By Mouth Once A Day  Allergies (verified): No Known Drug Allergies   PPM Specifications Following MD:  Lewayne Bunting, MD     PPM Vendor:  [employer]     PPM Model Number:  [id number]     PPM Serial Number:  [id number] PPM DOI:  04/01/1999      Lead 1    Location: RV     DOI: 04/01/1999     Model #: [id number]     Serial #: [id number]     Status: active  Magnet Response Rate:  BOL 85 ERI  65  Indications:  Sick sinus syndrome A-fib +coumadin   PPM Follow Up Remote Check?  No Battery Voltage:  2.76 V     Battery Est. Longevity:  3 years     Pacer Dependent:  No     Right Ventricle  Amplitude: >11.2 mV, Impedance: 739 ohms, Threshold: 1.0 V at 0.4 msec  Episodes Ventricular Pacing:  24%  Parameters Mode:  VVIR     Lower Rate Limit:  60     Upper Rate Limit:  120 Next Cardiology Appt Due:  05/19/2009 Tech  Comments:  No changes Device function normal 11.6% heart rates>120BPM.  Patient c/o increased fatigue. Altha Harm, LPN  November 20, 2008 3:36 PM  MD Comments:  Agree with above.

## 2010-05-20 NOTE — Assessment & Plan Note (Signed)
Summary: [name]  Anticoagulant Therapy Managed by: [name]. Janie Morning  PharmD CACP PCP: Zoila Shutter MD [facility] Attending: Josem Kaufmann MD, Lawrence Indication 1: TIA/CVA Indication 2: Aftercare long term use Anticoagulants V58.61, V58.83 Start date: 04/28/2000 Duration: Indefinite  Patient Assessment Reviewed by: [name] PharmD  May 18, 2009 Medication review: verified warfarin dosage & schedule,verified previous prescription medications, verified doses & any changes, verified new medications, reviewed OTC medications, reviewed OTC health products-vitamins supplements etc Complications: none Dietary changes: none   Health status changes: none   Lifestyle changes: none   Recent/future hospitalizations: none   Recent/future procedures: none   Recent/future dental: none Patient Assessment Part 2:  Have you MISSED ANY DOSES or CHANGED TABLETS?  No missed Warfarin doses or changed tablets.  Have you had any BRUISING or BLEEDING ( nose or gum bleeds,blood in urine or stool)?  No reported bruising or bleeding in nose, gums, urine, stool.  Have you STARTED or STOPPED any MEDICATIONS, including OTC meds,herbals or supplements?  No other medications or herbal supplements were started or stopped.  Have you CHANGED your DIET, especially green vegetables,or ALCOHOL intake?  No changes in diet or alcohol intake.  Have you had any ILLNESSES or HOSPITALIZATIONS?  No reported illnesses or hospitalizations  Have you had any signs of CLOTTING?(chest discomfort,dizziness,shortness of breath,arms tingling,slurred speech,swelling or redness in leg)    No chest discomfort, dizziness, shortness of breath, tingling in arm, slurred speech, swelling, or redness in leg.     Treatment  Target INR: 2.0-3.0 INR: 2.2  Date: 05/18/2009 Regimen In:  47.5mg /week INR reflects regimen in: 2.2  New  Tablet strength: : 5mg  Regimen Out:     Sunday: 1 & 1/2 Tablet     Monday: 1 & 1/2 Tablet  Tuesday: 1 & 1/2 Tablet     Wednesday: 1 Tablet     Thursday: 1 & 1/2 Tablet      Friday: 1 & 1/2 Tablet     Saturday: 1 & 1/2 Tablet Total Weekly: 50mg /wk mg  Next INR Due: 06/15/2009 Adjusted by: [name]. [name] PharmD CACP   Return to anticoagulation clinic:  06/15/2009 Time of next visit: 100    Allergies: No Known Drug Allergies

## 2010-05-20 NOTE — Cardiovascular Report (Signed)
Summary: Office Visit   Office Visit   Imported By: Roderic Ovens 07/20/2009 14:43:26  _____________________________________________________________________  External Attachment:    Type:   Image     Comment:   External Document

## 2010-05-20 NOTE — Letter (Signed)
Summary: Patient Notice- Polyp Results  [facility]  [address] [place], [place] [address]   Phone: [phone]  Fax: [phone]        December 16, 2009 MRN: [id number]    [facility] [address] [place], [place]  [address]    Dear Ms. Sicard,  I am pleased to inform you that the colon polyp(s) removed during your recent colonoscopy was (were) found to be benign (no cancer detected) upon pathologic examination.  I recommend you have a repeat colonoscopy examination in 3_ years to look for recurrent polyps, as having colon polyps increases your risk for having recurrent polyps or even colon cancer in the future.No cancer in the polyps.Repeat exam 3 years depending on your health ststus.  Should you develop new or worsening symptoms of abdominal pain, bowel habit changes or bleeding from the rectum or bowels, please schedule an evaluation with either your primary care physician or with me.  Additional information/recommendations:  _x_ No further action with gastroenterology is needed at this time. Please      follow-up with your primary care physician for your other healthcare      needs.  __ Please call [phone] to schedule a return visit to review your      situation.  __ Please keep your follow-up visit as already scheduled.  __ Continue treatment plan as outlined the day of your exam.  Please call us if you are having persistent problems or have questions about your condition that have not been fully answered at this time.  Sincerely,  Mardella Layman MD [facility]  This letter has been electronically signed by your physician.  Appended Document: Patient Notice- Polyp Results letter mailed 9.2.11

## 2010-05-20 NOTE — Discharge Summary (Signed)
Summary: Hospital Discharge Update (leg edema, suprtherapeutric INR)    Hospital Discharge Update:  Date of Admission: 10/06/2009 Date of Discharge: 10/07/2009  Brief Summary:  LEg edema- releived Supratherapeutic INR- Vit K in the hospital. INR at D/c 1.45. PAtient discharged on cipro for UTI.   Lab or other results pending at discharge:  none  Labs needed at follow-up: Basic metabolic panel, PT/INR  Other follow-up issues:  INR. PAtient is discharged on coumadin COlonoscopy referral outpatiet for Iron def anemia and wt loss.  Mildly elevated creatinine in  the hospital. Resolved at d/c UTI- discharged with 5 days course of ciprofloxacin D/c norvasc due to soft BP  Problem list changes:  Added new problem of UTI (ICD-599.0)  Medication list changes:  Removed medication of WARFARIN SODIUM 5 MG TABS (WARFARIN SODIUM) Use as directed by Anticoagulation Clinic - Signed Added new medication of WARFARIN SODIUM 5 MG TABS (WARFARIN SODIUM) Per coumadin clinic - Signed Removed medication of NORVASC 10 MG TABS (AMLODIPINE BESYLATE) Take 1 tablet by mouth once a day Added new medication of CIPROFLOXACIN HCL 500 MG TABS (CIPROFLOXACIN HCL) Take 1 tablet by mouth two times a day for 5 days - Signed Added new medication of LOVENOX 60 MG/0.6ML SOLN (ENOXAPARIN SODIUM) two times a day subcutaneous until 10/12/2009 Rx of CIPROFLOXACIN HCL 500 MG TABS (CIPROFLOXACIN HCL) Take 1 tablet by mouth two times a day for 5 days;  #10 x 0;  Signed;  Entered by: Bethel Born MD;  Authorized by: Bethel Born MD;  Method used: Electronically to [facility] [address] [place] [id number] [place], [place], [place]  [address], Ph: [phone], Fax: [phone]  The medication, problem, and allergy lists have been updated.  Please see the dictated discharge summary for details.  Discharge medications:  LOPRESSOR 100 MG TABS (METOPROLOL TARTRATE) Take 1 tablet by mouth two times a day LASIX 20 MG TABS  (FUROSEMIDE) Take 1 tablet by mouth once a day LISINOPRIL 40 MG  TABS (LISINOPRIL) Take 1 tablet by mouth once a day METFORMIN HCL 500 MG TABS (METFORMIN HCL) Take two tabs by mouth a day COLACE 100 MG CAPS (DOCUSATE SODIUM) Take 1 capsule  by mouth two times a day as needed for constipation VESICARE 10 MG TABS (SOLIFENACIN SUCCINATE) Take 1 tablet by mouth once a day OMEPRAZOLE 20 MG TBEC (OMEPRAZOLE) Take 1 tablet by mouth once a day TRAMADOL HCL 50 MG TABS (TRAMADOL HCL) Take one tablet every 6 hours X 3 days, then reduce to as needed. GLYCOLAX  POWD (POLYETHYLENE GLYCOL 3350) put one capful in 8 ounces of water and drink daily or 2 times a day as needed SLOW RELEASE IRON 47.5 MG CR-TABS (FERROUS SULFATE) Take one tablet by mouth once daily. WARFARIN SODIUM 5 MG TABS (WARFARIN SODIUM) Per coumadin clinic CIPROFLOXACIN HCL 500 MG TABS (CIPROFLOXACIN HCL) Take 1 tablet by mouth two times a day for 5 days LOVENOX 60 MG/0.6ML SOLN (ENOXAPARIN SODIUM) two times a day subcutaneous until 10/12/2009  Other patient instructions:  Your next appointment with Dr. Coralee Pesa in the [facility] is on 10/20/2009 at 10:45 AM.  Come to the coumadin clinic on Monday 6.27.2011 at 2 pm  STOP TAKING NORVASC   Note: Hospital Discharge Medications & Other Instructions handout was printed, one copy for patient and a second copy to be placed in hospital chart.

## 2010-05-20 NOTE — Assessment & Plan Note (Addendum)
Summary: [name]  Anticoagulant Therapy Managed by: [name]. [name]  PharmD CACP PCP: [name], MD [facility] Attending: [name], Beth Indication 1: TIA/CVA Indication 2: Aftercare long term use Anticoagulants V58.61, V58.83 Start date: 04/28/2000 Duration: Indefinite  Patient Assessment Reviewed by: [name] PharmD  May 10, 2010 Medication review: verified warfarin dosage & schedule,verified previous prescription medications, verified doses & any changes, verified new medications, reviewed OTC medications, reviewed OTC health products-vitamins supplements etc Complications: none Dietary changes: none   Health status changes: none   Lifestyle changes: none   Recent/future hospitalizations: none   Recent/future procedures: none   Recent/future dental: none Patient Assessment Part 2:  Have you MISSED ANY DOSES or CHANGED TABLETS?  YES. Missed YESTERDAY's dose. Now has filled Rx in hand.  Have you had any BRUISING or BLEEDING ( nose or gum bleeds,blood in urine or stool)?  No reported bruising or bleeding in nose, gums, urine, stool.  Have you STARTED or STOPPED any MEDICATIONS, including OTC meds,herbals or supplements?  No other medications or herbal supplements were started or stopped.  Have you CHANGED your DIET, especially green vegetables,or ALCOHOL intake?  No changes in diet or alcohol intake.  Have you had any ILLNESSES or HOSPITALIZATIONS?  No reported illnesses or hospitalizations  Have you had any signs of CLOTTING?(chest discomfort,dizziness,shortness of breath,arms tingling,slurred speech,swelling or redness in leg)    No chest discomfort, dizziness, shortness of breath, tingling in arm, slurred speech, swelling, or redness in leg.     Treatment  Target INR: 2.0-3.0 INR: 2.1  Date: 05/10/2010 Regimen In:  45.0mg /week INR reflects regimen in: 2.1  New  Tablet strength: : 5mg  Regimen Out:     Sunday: 1 & 1/2 Tablet     Monday: 1  Tablet     Tuesday: 1 & 1/2 Tablet     Wednesday: 1 Tablet     Thursday: 1 & 1/2 Tablet      Friday: 1 Tablet     Saturday: 1 & 1/2 Tablet Total Weekly: 45.0mg /week mg  Next INR Due: 06/07/2010 Adjusted by: [name]. [name] PharmD CACP   Return to anticoagulation clinic:  06/07/2010 Time of next visit: 1000    Allergies: No Known Drug Allergies

## 2010-05-20 NOTE — Letter (Signed)
Summary: Anticoagulation Modification Letter  [facility]  [address] [place], [place] [address]   Phone: [phone]  Fax: [phone]    November 02, 2009  Re:    [name] DOB:    02/28/1926 MRN:    [id number]    Dear [name]:  We have scheduled the above patient for an endoscopic procedure. Our records show that  he/she is on anticoagulation therapy. Please advise as to how long the patient may come off their therapy of Coumadin prior to the scheduled procedure(s) on December 14, 2009.   Will patient need Lovenox bridge?   Please fax back/or route the completed form to Ashok Cordia RN at [phone].  Thank you for your help with this matter.  Sincerely,  Ashok Cordia RN   Physician Recommendation:    Hold Coumadin 5 days prior ____________  Lovenox?   Other ______________________________    Appended Document: Anticoagulation Modification Letter Recieved reply form Dr. Coralee Pesa,  Pt may hold coumadin for 5 days.  No lovenox needed.  Dtr. [name] notified.
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
## 2010-05-20 NOTE — Assessment & Plan Note (Signed)
Summary: [facility]  Anticoagulant Therapy Managed by: [name]. Janie Morning  PharmD CACP PCP: Zoila Shutter MD [facility] Attending: Coralee Pesa MD, Levada Schilling Indication 1: TIA/CVA Indication 2: Aftercare long term use Anticoagulants V58.61, V58.83 Start date: 04/28/2000 Duration: Indefinite  Patient Assessment Reviewed by: [name] PharmD  October 22, 2009 Medication review: verified warfarin dosage & schedule,verified previous prescription medications, verified doses & any changes, verified new medications, reviewed OTC medications, reviewed OTC health products-vitamins supplements etc Complications: none Dietary changes: none   Health status changes: none   Lifestyle changes: none   Recent/future hospitalizations: none   Recent/future procedures: none   Recent/future dental: none Patient Assessment Part 2:  Have you MISSED ANY DOSES or CHANGED TABLETS?  No missed Warfarin doses or changed tablets.  Have you had any BRUISING or BLEEDING ( nose or gum bleeds,blood in urine or stool)?  No reported bruising or bleeding in nose, gums, urine, stool.  Have you STARTED or STOPPED any MEDICATIONS, including OTC meds,herbals or supplements?  No other medications or herbal supplements were started or stopped.  Have you CHANGED your DIET, especially green vegetables,or ALCOHOL intake?  No changes in diet or alcohol intake.  Have you had any ILLNESSES or HOSPITALIZATIONS?  No reported illnesses or hospitalizations  Have you had any signs of CLOTTING?(chest discomfort,dizziness,shortness of breath,arms tingling,slurred speech,swelling or redness in leg)    No chest discomfort, dizziness, shortness of breath, tingling in arm, slurred speech, swelling, or redness in leg.     Treatment  Target INR: 2.0-3.0 INR: 3.4  Date: 10/22/2009 Regimen In:  50mg /wk INR reflects regimen in: 3.4  New  Tablet strength: : 5mg  Regimen Out:     Sunday: 1 & 1/2 Tablet     Monday: 1 Tablet     Tuesday:  1 & 1/2 Tablet     Wednesday: 1 & 1/2 Tablet     Thursday: 1 Tablet      Friday: 1 & 1/2 Tablet     Saturday: 1 & 1/2 Tablet Total Weekly: 47.5mg /week mg  Next INR Due: 11/16/2009 Adjusted by: [name]. [name] PharmD CACP   Return to anticoagulation clinic:  11/16/2009 Time of next visit: 1015    Allergies: No Known Drug Allergies

## 2010-05-20 NOTE — Assessment & Plan Note (Signed)
Summary: RA/NEEDS ROUTINE CHECKUP/CH   Vital Signs:  Patient profile:   77 year old female Height:      65 inches (165.10 cm) Weight:      133.8 pounds (60.82 kg) BMI:     22.35 Temp:     98.7 degrees F (37.06 degrees C) oral Pulse rate:   82 / minute BP sitting:   121 / 66  (right arm)  Vitals Entered By: Chinita Pester RN (May 26, 2009 10:12 AM) CC: F/U visit.  c/o constipation and decreased appetite Is Patient Diabetic? Yes Did you bring your meter with you today? Yes Nutritional Status BMI of 19 -24 = normal CBG Result 127  Have you ever been in a relationship where you felt threatened, hurt or afraid?Unable to ask; someone w/pt.   Does patient need assistance? Functional Status Self care Ambulation Normal   Diabetic Foot Exam Last Podiatry Exam Date: 05/26/2009  Foot Inspection Is there a history of a foot ulcer?              No Is there a foot ulcer now?              No Can the patient see the bottom of their feet?          No Are the shoes appropriate in style and fit?          Yes Is there swelling or an abnormal foot shape?          No Are the toenails long?                No Are the toenails thick?                No Are the toenails ingrown?              No Is there heavy callous build-up?              No  Diabetic Foot Care Education Pulse Check          Right Foot          Left Foot Dorsalis Pedis:        normal            normal Comments: Corns on both "pinky" toes.   10-g (5.07) Semmes-Weinstein Monofilament Test Performed by: Chinita Pester RN          Right Foot          Left Foot Visual Inspection               Test Control      normal         normal Site 1         normal         normal Site 2         normal         normal Site 3         normal         abnormal Site 4         normal         normal Site 5         normal         normal Site 6         normal         normal Site 7         normal         normal  Site 8         normal          normal Site 9         abnormal         abnormal   Primary Care Provider:  Zoila Shutter MD  CC:  F/U visit.  c/o constipation and decreased appetite.  History of Present Illness: 77 year old who comes in for follow up. Pt. saw Dr. Andrey Campanile in my absence in 11/10. At that time she put her on smaller metformin pills. She is tolerating these better. She has actually self decreased them to 2 in the am. Her sugars are good. And her HbA1c is 6.1 today.  She is followed in the coumadin clinic.  She has diabetes and hypertension.  She is has trouble with her appetite, and she has lost 5 pounds. However it sounds like it may be due to constipation.  When she was last in she was found to have a Hb of 10.0. The last one checked in the office was in 2008 and was 12.4. Dr. Andrey Campanile prescribed iron but she never took it and doesn't want to, especially with the constipation.   Preventive Screening-Counseling & Management  Alcohol-Tobacco     Alcohol type: AT TIMES - BEER/WINE     Smoking Status: quit     Year Quit: 45 YEARS AGO  Caffeine-Diet-Exercise     Does Patient Exercise: no  Current Medications (verified): 1)  Lopressor 100 Mg Tabs (Metoprolol Tartrate) .... Take 1 Tablet By Mouth Two Times A Day 2)  Lasix 20 Mg Tabs (Furosemide) .... Take 1 Tablet By Mouth Once A Day 3)  Lisinopril 40 Mg  Tabs (Lisinopril) .... Take 1 Tablet By Mouth Once A Day 4)  Metformin Hcl 500 Mg Tabs (Metformin Hcl) .... Take Two Tabs By Mouth A Day 5)  Coumadin 5 Mg Tabs (Warfarin Sodium) .... Take One and A Half Tablets By Mouth Daily. 6)  Tylenol Arthritis Pain 650 Mg  Tbcr (Acetaminophen) .... Take One Tablet By Mouth Every Four Hours As Needed For Pain. 7)  Colace 100 Mg Caps (Docusate Sodium) .... Take 1 Capsule  By Mouth Two Times A Day As Needed For Constipation 8)  Vesicare 10 Mg Tabs (Solifenacin Succinate) .... Take 1 Tablet By Mouth Once A Day 9)  Norvasc 10 Mg Tabs (Amlodipine Besylate) .... Take 1  Tablet By Mouth Once A Day 10)  Omeprazole 20 Mg Tbec (Omeprazole) .... Take 1 Tablet By Mouth Once A Day 11)  Tramadol Hcl 50 Mg Tabs (Tramadol Hcl) .... Take One Tablet Every 6 Hours X 3 Days, Then Reduce To As Needed. 12)  Caltrate 600+d Plus 600-400 Mg-Unit Tabs (Calcium Carbonate-Vit D-Min) .... 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 lives at home and has 8 children, many check on her    Review of Systems CV:  Denies chest pain or discomfort and palpitations. Resp:  Denies cough and shortness of breath. GI:  Complains of indigestion; denies abdominal pain.  Physical Exam  General:  alert. slim  Neck:  supple, full ROM, and no masses.   Lungs:  normal breath sounds.   Heart:  irregularly irregular, normal rate, no murmurs heard Abdomen:  soft, non-tender, and normal bowel sounds.   Extremities:  no edema Neurologic:  alert & oriented X3.     Impression & Recommendations:  Problem # 1:  DIABETES MELLITUS, TYPE II (ICD-250.00) Patient is tolerating the smaller metformin tablets. She has only been taking 2 in am and her HbA1c is 6.1. Therefore will keep her on just 2 a day but will change to two times a day.  Her updated medication list for this problem includes:    Lisinopril 40 Mg Tabs (Lisinopril) .Marland Kitchen... Take 1 tablet by mouth once a day    Metformin Hcl 500 Mg Tabs (Metformin hcl) .Marland Kitchen... Take two tabs by mouth a day  Orders: T-Urine Microalbumin w/creat. ratio [id number])  Problem # 2:  UNSPECIFIED ANEMIA (ICD-285.9) Pt did not ever start iron and did not want to because of constipation. Looking back at 10/09 hospitalization, Hb was 11.3 with MCV of 95. Patient likes liver, beets and collard greens so will supplement her diet. I will ask Dr. Alexandria Lodge to check cbc in coumadin clinic next month. As well we will check hemoccults. The following medications were removed  from the medication list:    Ferrous Sulfate 325 (65 Fe) Mg Tbec (Ferrous sulfate) .Marland Kitchen... Take 1 tablet by mouth three times a day  Problem # 3:  CONSTIPATION NOS (ICD-564.00) Actually have told pt. that she can stop colace as mirilax will be much more effective. Her updated medication list for this problem includes:    Colace 100 Mg Caps (Docusate sodium) .Marland Kitchen... Take 1 capsule  by mouth two times a day as needed for constipation    Glycolax Powd (Polyethylene glycol 3350) .Marland Kitchen... Put one capful in 8 ounces of water and drink daily or 2 times a day as needed  Problem # 4:  Preventive Health Care (ICD-V70.0) Pt. refuses all immunizations. Hemoccults given today. Mammogram done 3/10, and they will schedule 3/11.  Problem # 5:  HYPERTENSION (ICD-401.9) Well controlled. Her updated medication list for this problem includes:    Lopressor 100 Mg Tabs (Metoprolol tartrate) .Marland Kitchen... Take 1 tablet by mouth two times a day    Lasix 20 Mg Tabs (Furosemide) .Marland Kitchen... Take 1 tablet by mouth once a day    Lisinopril 40 Mg Tabs (Lisinopril) .Marland Kitchen... Take 1 tablet by mouth once a day    Norvasc 10 Mg Tabs (Amlodipine besylate) .Marland Kitchen... Take 1 tablet by mouth once a day  Problem # 6:  CEREBROVASCULAR ACCIDENT, HX OF (ICD-V12.50) Pt. stable. Continue coumadin.  Complete Medication List: 1)  Lopressor 100 Mg Tabs (Metoprolol tartrate) .... Take 1 tablet by mouth two times a day 2)  Lasix 20 Mg Tabs (Furosemide) .... Take 1 tablet by mouth once a day 3)  Lisinopril 40 Mg Tabs (Lisinopril) .... Take 1 tablet by mouth once a day 4)  Metformin Hcl 500 Mg Tabs (Metformin hcl) .... Take two tabs by mouth a day 5)  Coumadin 5 Mg Tabs (Warfarin sodium) .... Take one and a half tablets by mouth daily. 6)  Tylenol Arthritis Pain 650 Mg Tbcr (Acetaminophen) .... Take one tablet by mouth every four hours as needed for pain. 7)  Colace 100 Mg Caps (Docusate sodium) .... Take 1 capsule  by mouth two times a day as needed for  constipation 8)  Vesicare 10 Mg Tabs (Solifenacin succinate) .... Take 1 tablet by mouth once a day 9)  Norvasc 10 Mg Tabs (Amlodipine besylate) .... Take 1 tablet by mouth once a day 10)  Omeprazole 20 Mg Tbec (Omeprazole) .... Take 1 tablet by mouth once a day 11)  Tramadol Hcl 50 Mg Tabs (Tramadol hcl) .... Take one  tablet every 6 hours x 3 days, then reduce to as needed. 12)  Caltrate 600+d Plus 600-400 Mg-unit Tabs (Calcium carbonate-vit d-min) .... Take 1 tablet by mouth two times a day 13)  Glycolax Powd (Polyethylene glycol 3350) .... Put one capful in 8 ounces of water and drink daily or 2 times a day as needed  Other Orders: T-Hgb A1C (in-house) (16109UE) T- Capillary Blood Glucose (45409) T-Hemoccult Card-Multiple (take home) (81191)    Patient Instructions: 1)  Please schedule a follow-up appointment in 3 months. 2)  I have refilled all of your prescriptions for a year. 3)  I will give you a prescription for Mirilax for constipation. You can use it up to 2 times a day or just a couple times a week  as needed. 4)  We have sent you home with cards to send back to make sure there is no blood in you bowels. 5)  Try to walk some with your daughter. 6)  Have a good spring. Prescriptions: GLYCOLAX  POWD (POLYETHYLENE GLYCOL 3350) put one capful in 8 ounces of water and drink daily or 2 times a day as needed  #1 large can x 11   Entered and Authorized by:   Zoila Shutter MD   Signed by:   Zoila Shutter MD on 05/26/2009   Method used:   Electronically to        [facility] [address] (retail)       [address] [place], [place]  [address]       Ph: [phone]       Fax: [phone]   RxID:   [id number] TRAMADOL HCL 50 MG TABS (TRAMADOL HCL) Take one tablet every 6 hours X 3 days, then reduce to as needed.  #30 Tablet x 1   Entered and Authorized by:   Zoila Shutter MD   Signed by:   Zoila Shutter MD on 05/26/2009   Method used:    Electronically to        [facility] [address] (retail)       [address] [place], [place]  [address]       Ph: [phone]       Fax: [phone]   RxID:   [id number] OMEPRAZOLE 20 MG TBEC (OMEPRAZOLE) Take 1 tablet by mouth once a day  #31 x 11   Entered and Authorized by:   Zoila Shutter MD   Signed by:   Zoila Shutter MD on 05/26/2009   Method used:   Electronically to        [facility] [address] (retail)       [address] [place], [place]  [address]       Ph: [phone]       Fax: [phone]   RxID:   [id number] NORVASC 10 MG TABS (AMLODIPINE BESYLATE) Take 1 tablet by mouth once a day  #31 x 11   Entered and Authorized by:   Zoila Shutter MD   Signed by:   Zoila Shutter MD on 05/26/2009   Method used:   Electronically to        [facility] [address] (retail)  [address]       [place], [place]  [address]       Ph: [phone]       Fax: [phone]   RxID:   [id number] VESICARE 10 MG TABS (SOLIFENACIN SUCCINATE) Take 1 tablet by mouth once a day  #31 x 11   Entered and Authorized by:   Zoila Shutter MD   Signed by:   Zoila Shutter MD on 05/26/2009   Method used:   Electronically to        [facility] [address] (retail)       [address] [place], [place]  [address]       Ph: [phone]       Fax: [phone]   RxID:   [id number] COUMADIN 5 MG TABS (WARFARIN SODIUM) Take one and a half tablets by mouth daily.  #50 x 11   Entered and Authorized by:   Zoila Shutter MD   Signed by:   Zoila Shutter MD on 05/26/2009   Method used:   Electronically to        [facility] [address] (retail)       [address] [place], [place]  [address]       Ph: [phone]       Fax: [phone]   RxID:   [id number] LISINOPRIL 40 MG  TABS (LISINOPRIL) Take 1 tablet  by mouth once a day  #31 Tablet x 11   Entered and Authorized by:   Zoila Shutter MD   Signed by:   Zoila Shutter MD on 05/26/2009   Method used:   Electronically to        [facility] [address] (retail)       [address]       [place], [place]  [address]       Ph: [phone]       Fax: [phone]   RxID:   [id number] LASIX 20 MG TABS (FUROSEMIDE) Take 1 tablet by mouth once a day  #31 x 11   Entered and Authorized by:   Zoila Shutter MD   Signed by:   Zoila Shutter MD on 05/26/2009   Method used:   Electronically to        [facility] [address] (retail)       [address]       [place], [place]  [address]       Ph: [phone]       Fax: [phone]   RxID:   [id number] LOPRESSOR 100 MG TABS (METOPROLOL TARTRATE) Take 1 tablet by mouth two times a day  #62 x 11   Entered and Authorized by:   Zoila Shutter MD   Signed by:   Zoila Shutter MD on 05/26/2009   Method used:   Electronically to        [facility] [address] (retail)       [address]       [place], [place]  [address]  Ph: [phone]       Fax: [phone]   RxID:   [id number] METFORMIN HCL 500 MG TABS (METFORMIN HCL) Take two tabs by mouth a day  #60 x 11   Entered and Authorized by:   Zoila Shutter MD   Signed by:   Zoila Shutter MD on 05/26/2009   Method used:   Electronically to        [facility] [address] (retail)       [address]       [place], [place]  [address]       Ph: [phone]       Fax: [phone]   RxID:   [id number]   Prevention & Chronic Care Immunizations   Influenza vaccine: Not documented   Influenza vaccine deferral: Refused  (03/03/2009)    Tetanus booster: Not documented   Td booster deferral: Refused  (05/26/2009)    Pneumococcal vaccine: Not documented   Pneumococcal vaccine deferral: Refused  (05/26/2009)    H.  zoster vaccine: Not documented  Colorectal Screening   Hemoccult: Negative  (03/27/2001)   Hemoccult action/deferral: Ordered  (05/26/2009)    Colonoscopy: Not documented   Colonoscopy action/deferral: GI referral  (12/15/2008)  Other Screening   Pap smear: Normal  (11/27/2003)   Pap smear action/deferral: Deferred  (12/15/2008)    Mammogram: BI-RADS CATEGORY 2:  Benign finding(s).^MM DIGITAL SCREENING  (07/01/2008)   Mammogram action/deferral: Screening mammogram in 1 year.     (05/24/2007)    DXA bone density scan: Not documented   Smoking status: quit  (05/26/2009)  Diabetes Mellitus   HgbA1C: 6.1  (05/26/2009)    Eye exam: Not documented    Foot exam: Not documented   Foot exam action/deferral: Do today   High risk foot: Not documented   Foot care education: Not documented    Urine microalbumin/creatinine ratio: 27.3  (02/13/2007)   Urine microalbumin action/deferral: Ordered    Diabetes flowsheet reviewed?: Yes   Progress toward A1C goal: At goal  Lipids   Total Cholesterol: 153  (03/03/2009)   Lipid panel action/deferral: Lipid Panel ordered   LDL: 68  (03/03/2009)   LDL Direct: Not documented   HDL: 54  (03/03/2009)   Triglycerides: 153  (03/03/2009)  Hypertension   Last Blood Pressure: 121 / 66  (05/26/2009)   Serum creatinine: 1.05  (03/03/2009)   Serum potassium 4.0  (03/03/2009)    Hypertension flowsheet reviewed?: Yes   Progress toward BP goal: At goal  Self-Management Support :   Personal Goals (by the next clinic visit) :     Personal A1C goal: 7  (12/15/2008)     Personal blood pressure goal: 140/90  (12/15/2008)   Patient will work on the following items until the next clinic visit to reach self-care goals:     Medications and monitoring: check my blood sugar, examine my feet every day  (05/26/2009)     Eating: eat more vegetables, eat baked foods instead of fried foods  (05/26/2009)    Diabetes self-management support: Written self-care  plan  (05/26/2009)   Diabetes care plan printed    Hypertension self-management support: Written self-care plan  (05/26/2009)   Hypertension self-care plan printed.   Nursing Instructions: Provide Hemoccult cards with instructions (see order) Diabetic foot exam today   Process Orders Check Orders Results:     [facility]: ABN not required for this insurance Tests Sent for requisitioning (  May 26, 2009 1:28 PM):     05/26/2009: [facility] -- T-Urine Microalbumin w/creat. ratio [[id number]] (signed)    Laboratory Results   Blood Tests   Date/Time Received: May 26, 2009 10:36 AM  Date/Time Reported: Burke Keels  May 26, 2009 10:36 AM   HGBA1C: 6.1%   (Normal Range: Non-Diabetic - 3-6%   Control Diabetic - 6-8%) CBG Random:: 127mg /dL

## 2010-05-20 NOTE — Initial Assessments (Signed)
INTERNAL MEDICINE ADMISSION HISTORY AND PHYSICAL  First Contact: Dr. Eben Burow 319- Second Contact: Dr Cena Benton [phone]  PCP: Dr. Coralee Pesa  CC: LE Edema   HPI: 77 year old woman with PMH of A-fib, stroke, and pacemaker placement, presents to the ED for lower extremity edema that first began 4 days PTA. Pt reports it started on Thursday and began to worsen over the weekend. She denies pain or tenderness in her extremities. She mentions that she increased her intake of lasix in attempts to decrease the swelling, however states that she felt no relief. She denies chest pain, cough, shortness of breath, N/V, abdominal pain, palpitations, changes in bowel or urinary habits, or dizziness. At baseline pt is completely functional and is on her feet all day long as she is always cooking or cleaning.    ALLERGIES: NKDA  PAST MEDICAL HISTORY:  Allergic rhinitis Atrial fibrillation Congestive heart failure, diastolic dysfunction restrictive lung disease, mild , PFT's 1996 Diabetes mellitus, type II Hypertension Osteoporosis Cerebrovascular accident, R MCA, cardioembolic 2002 Urinary incontinence Anticoagulation therapy Sick Sinus syndrome, s/p Pacer Dr. [name] 2000 Onychomycosis Trichomonal urethritis, history Vaginal bleeding, atrophy, negative biopsy 2/07 Decline influenza vaccine 2/07, 11/10   MEDICATIONS:   LOPRESSOR 100 MG TABS (METOPROLOL TARTRATE) Take 1 tablet by mouth two times a day LASIX 20 MG TABS (FUROSEMIDE) Take 1 tablet by mouth once a day LISINOPRIL 40 MG  TABS (LISINOPRIL) Take 1 tablet by mouth once a day METFORMIN HCL 500 MG TABS (METFORMIN HCL) Take two tabs by mouth a day WARFARIN SODIUM 5 MG TABS (WARFARIN SODIUM) Use as directed by Anticoagulation Clinic COLACE 100 MG CAPS (DOCUSATE SODIUM) Take 1 capsule  by mouth two times a day as needed for constipation VESICARE 10 MG TABS (SOLIFENACIN SUCCINATE) Take 1 tablet by mouth once a day NORVASC 10 MG TABS (AMLODIPINE  BESYLATE) Take 1 tablet by mouth once a day OMEPRAZOLE 20 MG TBEC (OMEPRAZOLE) Take 1 tablet by mouth once a day TRAMADOL HCL 50 MG TABS (TRAMADOL HCL) Take one tablet every 6 hours X 3 days, then reduce to as needed. GLYCOLAX  POWD (POLYETHYLENE GLYCOL 3350) put one capful in 8 ounces of water and drink daily or 2 times a day as needed SLOW RELEASE IRON 47.5 MG CR-TABS (FERROUS SULFATE) Take one tablet by mouth once daily.   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   Head:  normocephalic and atraumatic.   Eyes:  vision grossly intact, pupils equal, pupils round, pupils reactive to light, no injection and anicteric.   Mouth:  pharynx pink and moist, no erythema, and no exudates.   Neck:  supple, full ROM, no thyromegaly, no JVD, and no carotid bruits.   Lungs:  normal respiratory effort, no accessory muscle use, normal breath sounds, no crackles, and no wheezes.  Heart:  irregular, irregular, no M/G/R   Abdomen:  soft, non-tender, normal bowel sounds, no distention, no guarding, no rebound tenderness, no hepatomegaly, and no splenomegaly.     Pulses:  2+ DP/PT pulses bilaterally Extremities:  bilateral lower extremity edema more prominent in feet, 1+ pitting extending to mid-calf Neurologic:  alert & oriented X3, cranial nerves II-XII intact, strength normal in all  extremities, sensation intact to light touch, and gait normal.   Skin:  turgor normal and no rashes.   Psych:  Oriented X3, memory intact for recent and remote, normally interactive, good eye contact, not anxious appearing, and not depressed appearing.   LABS:    Sodium (NA)                              130        l      135-145          mEq/L   Potassium (K)                            3.6               3.5-5.1          mEq/L  Chloride                                 100               96-112           mEq/L  CO2                                      21                19-32            mEq/L  Glucose                                  105        h      70-99            mg/dL  BUN                                      18                6-23             mg/dL  Creatinine                               1.28       h      0.4-1.2          mg/dL  GFR, Est Non African American            40         l      >60              mL/min  GFR, Est African American                48         l      >60              mL/min    Oversized comment, see footnote  1  Bilirubin, Total  0.6               0.3-1.2          mg/dL  Alkaline Phosphatase                     76                39-117           U/L  SGOT (AST)                               27                0-37             U/L  SGPT (ALT)                               12                0-35             U/L  Total  Protein                           7.4               6.0-8.3          g/dL  Albumin-Blood                            3.6               3.5-5.2          g/dL  Calcium                                  9.3               8.4-10.5         mg/dL  Protime ( Prothrombin Time)              72.5       h      11.6-15.2        seconds    REPEATED TO VERIFY  INR                                      8.93       H      0.00-1.49    REPEATED TO VERIFY   IMPRESSION:    1.  No acute cardiopulmonary process seen.   2.  Findings of COPD.   3.  Stable cardiomegaly.  ASSESSMENT AND PLAN:  1) LE Edema - exact etiology unknown at this point. Differential diagnosis includes acute CHF vs dependent edema, which is likely because pt admits to being on her feet all day. Albumin normal ruling out hypoalbuminemia. Cellulitis is also on differential but very unlikely in this patient as she has no signs  of infection.   Plan -Admit to telemetry -Will not diurese as patient has mild acute renal failure  -Ted Hose and keep legs elevated  -Will check BNP and repeat 2D Echo as last echo was  done in 2009 (EF 45-50% with mild AV regurg, RA and RV mildly dilated, MV regurg and mild systolic failure)  2) Acute Renal Failure - Cr mildly elevated at 1.28. At baseline, patient's creatinine is wnl. This may be secondary to dehydration. Will gently hydrate patient.   3) Supratherapeutic INR - at 8.93. Patient was recently seen by Dr. Alexandria Lodge 10/01/2009 where she had INR of 2.5. No changes were made to her regimen. Pt's daughter did report that pt may have increased intake of green vegetables. Perhaps with dietary changes and then resuming coumadin, it might have elevated INR. No active bleeding. CBC pending, thus do not know Hgb level. Will check FOBT. Will give patient Vit K and hold coumadin in attempt to reverse INR.   4) Iron deficiency anemia - most recent Hgb 9.8 done in 06/2009. CBC currently pending. Will monitor closely. Pt was to follow up with colonoscopy however did not show up to appt as she does not feel she needs a colonoscopy at this time. Will check heme-occult during this hospitalization.    5) Hx of CVA in 2002 - now on anticoagulation. No residual neurologic deficits. Will hold coumadin due to supratherapeutic INR.   6) A-fib - currently on coumadin and lopressor. Will continue lopressor.   7) DM - most recent A1C is 6.1. Will recheck A1C and start pt on SSI.   8) VTE PROPH: SCDs   Attending Physician:  I performed and/or observed a history and physical examination of the patient.  I discussed the case with the residents as noted and reviewed the residents' notes.  I agree with the findings and plan--please refer to the attending physician note for more details.  Signature  Printed Name

## 2010-05-20 NOTE — Letter (Signed)
Summary: [facility] Instructions  [facility]  [address] [place], [place] [address]   Phone: [phone]  Fax: [phone]       SHAINDY READER    [birth year]    MRN: [id number]        Procedure Day /Date: Monday, 12/14/09     Arrival Time: 8:30      Procedure Time: 9:30     Location of Procedure:                    [facility]  [facility] (4th Floor)                        PREPARATION FOR COLONOSCOPY WITH MOVIPREP   Starting 5 days prior to your procedure 12/09/09 do not eat nuts, seeds, popcorn, corn, beans, peas,  salads, or any raw vegetables.  Do not take any fiber supplements (e.g. Metamucil, Citrucel, and Benefiber).  THE DAY BEFORE YOUR PROCEDURE         DATE: 12/13/09    DAY: Sunday  1.  Drink clear liquids the entire day-NO SOLID FOOD  2.  Do not drink anything colored red or purple.  Avoid juices with pulp.  No orange juice.  3.  Drink at least 64 oz. (8 glasses) of fluid/clear liquids during the day to prevent dehydration and help the prep work efficiently.  CLEAR LIQUIDS INCLUDE: Water Jello Ice Popsicles Tea (sugar ok, no milk/cream) Powdered fruit flavored drinks Coffee (sugar ok, no milk/cream) Gatorade Juice: apple, white grape, white cranberry  Lemonade Clear bullion, consomm, broth Carbonated beverages (any kind) Strained chicken noodle soup Hard Candy                             4.  In the morning, mix first dose of MoviPrep solution:    Empty 1 Pouch A and 1 Pouch B into the disposable container    Add lukewarm drinking water to the top line of the container. Mix to dissolve    Refrigerate (mixed solution should be used within 24 hrs)  5.  Begin drinking the prep at 5:00 p.m. The MoviPrep container is divided by 4 marks.   Every 15 minutes drink the solution down to the next mark (approximately 8 oz) until the full liter is complete.   6.  Follow completed prep with 16 oz of clear liquid of your choice (Nothing red  or purple).  Continue to drink clear liquids until bedtime.  7.  Before going to bed, mix second dose of MoviPrep solution:    Empty 1 Pouch A and 1 Pouch B into the disposable container    Add lukewarm drinking water to the top line of the container. Mix to dissolve    Refrigerate  THE DAY OF YOUR PROCEDURE      DATE: 12/14/09    DAY: Monday  Beginning at 3:30 a.m. (5 hours before procedure):         1. Every 15 minutes, drink the solution down to the next mark (approx 8 oz) until the full liter is complete.  2. Follow completed prep with 16 oz. of clear liquid of your choice.    3. You may drink clear liquids until 7:30 (2 HOURS BEFORE PROCEDURE).   MEDICATION INSTRUCTIONS  Unless otherwise instructed, you should take regular prescription medications with a small sip of water   as early as possible  the morning of your procedure.  Diabetic patients - see separate instructions.  We will contact you with instructions about your coumadin.             OTHER INSTRUCTIONS  You will need a responsible adult at least 75 years of age to accompany you and drive you home.   This person must remain in the waiting room during your procedure.  Wear loose fitting clothing that is easily removed.  Leave jewelry and other valuables at home.  However, you may wish to bring a book to read or  an iPod/MP3 player to listen to music as you wait for your procedure to start.  Remove all body piercing jewelry and leave at home.  Total time from sign-in until discharge is approximately 2-3 hours.  You should go home directly after your procedure and rest.  You can resume normal activities the  day after your procedure.  The day of your procedure you should not:   Drive   Make legal decisions   Operate machinery   Drink alcohol   Return to work  You will receive specific instructions about eating, activities and medications before you leave.    The above instructions have  been reviewed and explained to me by   _______________________    I fully understand and can verbalize these instructions _____________________________ Date _________

## 2010-05-20 NOTE — Progress Notes (Signed)
Summary: phone/gg    Phone Note Call from Patient   Caller: Daughter Summary of Call: Call from pt's daughter with c/o  pt toes turning dark and swelling from knee to both feet.   Onset saturday. swelling is on and off.  THis is new for pt. Denies SOB, denies other swelling.   Temperature and color  of legs are normal for pt.  First appointment is in July.  Please advise.  # [phone] Manual Meier ) Initial call taken by: Merrie Roof RN,  October 05, 2009 3:56 PM  Follow-up for Phone Call        Sorry, but she needs to be seen.  If we have no openings, uregent care would be preferable over the ER.  Worried about cellulitis, CHF exac, clots to legs from AFib.... Follow-up by: Blanch Media MD,  October 05, 2009 4:02 PM  Additional Follow-up for Phone Call Additional follow up Details #1::        Daughter informed and voices understanding

## 2010-05-20 NOTE — Assessment & Plan Note (Signed)
Summary: #3 of 3 weekly B12/226.2/dfs  Nurse Visit   Allergies: No Known Drug Allergies  Medication Administration  Injection # 1:    Medication: Vit B12 1000 mcg    Diagnosis: B12 DEFICIENCY (ICD-266.2)    Route: IM    Site: L deltoid    Exp Date: 09/2011    Lot #: [id number]    Mfr: [employer]    Comments: pt scheduled next appointment for 1 month    Patient tolerated injection without complications    Given by: Merri Ray CMA (AAMA) (November 23, 2009 10:15 AM)  Orders Added: 1)  Vit B12 1000 mcg [J3420]

## 2010-05-20 NOTE — Assessment & Plan Note (Signed)
Summary: EST-6 WEEK RECHECK/CH   Vital Signs:  Patient profile:   77 year old female Height:      65 inches (165.10 cm) Weight:      121.1 pounds (55.05 kg) BMI:     20.22 Temp:     98.4 degrees F (36.89 degrees C) oral Pulse rate:   97 / minute BP sitting:   116 / 76  (left arm)  Vitals Entered By: Stanton Kidney Ditzler RN (March 30, 2010 9:46 AM) Is Patient Diabetic? No Pain Assessment Patient in pain? no      Nutritional Status BMI of 19 -24 = normal Nutritional Status Detail appetite better.  Have you ever been in a relationship where you felt threatened, hurt or afraid?denies   Does patient need assistance? Functional Status Self care Ambulation Normal Comments Daughter with pt. FU and leaking urine only at night.   Primary Care Provider:  [name], MD   History of Present Illness: 77 yr old who comes in for 6 week follow up.  1. Weight loss: Thankfully her weight is stable over this period of time. She has only gone down one pound. Her appetite has gotten better. Her daughter has never gone to get the glycerna.  2. HTN: Blood pressure in excellent control today.  3. DM: This is now diet controlled with a HbA1c of 5.8 6 weeks ago.  4. Stress urinary incontinence: This is occuring only at night on the vesicare. And she says it is occuring only one night a week. We talked about things she could do to help this such as stopping drinking water a couple hours before she went to bed, wearing a pad; however at this point with it only occuring one night a week, she is happy to just follow.  5. Pain secondary to DJD: she had c/o this at last visit in her hans and back but has not has any pain so she has not had to take the tramadol.  6. Anemia: She has a history of iron deficiency anemia-intolerant to by mouth iron, and B12 deficiency anemia. Dr. Norval Gable office had given her a series of IM injections and then prescribed an intranasal B12 preparation which she never got  filled because it was too expensive.   Depression History:      The patient denies a depressed mood most of the day and a diminished interest in her usual daily activities.         Preventive Screening-Counseling & Management  Alcohol-Tobacco     Alcohol type: AT TIMES - BEER/WINE     Smoking Status: quit     Year Quit: 45 YEARS AGO  Caffeine-Diet-Exercise     Does Patient Exercise: no  Current Medications (verified): 1)  Lopressor 100 Mg Tabs (Metoprolol Tartrate) .... Take 1 Tablet By Mouth Two Times A Day 2)  Lasix 20 Mg Tabs (Furosemide) .... Take 1 Tablet By Mouth Once A Day As Needed 3)  Lisinopril 40 Mg  Tabs (Lisinopril) .... Take 1 Tablet By Mouth Once A Day 4)  Vesicare 10 Mg Tabs (Solifenacin Succinate) .... Take 1 Tablet By Mouth Once A Day 5)  Warfarin Sodium 5 Mg Tabs (Warfarin Sodium) .... Per [facility] 6)  Miralax   Powd (Polyethylene Glycol 3350) .... Two Times A Day 7)  Tramadol Hcl 50 Mg Tabs (Tramadol Hcl) .... Take One Three Times A Day As Needed For Pain  Allergies (verified): No Known Drug Allergies  Review of Systems General:  Denies fatigue,  fever, loss of appetite, weakness, and weight loss. CV:  Denies chest pain or discomfort and palpitations. Resp:  Denies cough and shortness of breath. GI:  Denies abdominal pain, nausea, and vomiting. GU:  Complains of incontinence. MS:  Denies joint pain, joint redness, and low back pain. Psych:  Denies depression.  Physical Exam  General:  alert and well-developed.   Head:  normocephalic and atraumatic.   Eyes:  vision grossly intact.   Mouth:  pharynx pink and moist.   Neck:  supple and full ROM.   Lungs:  normal respiratory effort and normal breath sounds.   Heart:  normal rate and regular rhythm.   Abdomen:  soft, non-tender, and normal bowel sounds.   Msk:  normal ROM and no joint swelling.   Extremities:  trace pertibial and ankle edema bilaterally which has been stable   Impression &  Recommendations:  Problem # 1:  WEIGHT LOSS (ICD-783.21) Assessment Improved This was the main reason that I wanted to see patient back today. I am very glad that her weight seems to be stabilizing. I have saked them to check her weight once a week at home. As well I will see her back in 2 months for follow up. I have told her daughter that she does not have to get the glycerna, she seems very reluctant to do so, as long as her appetite and weight stay up.  Problem # 2:  B12 DEFICIENCY (ICD-266.2) I am not sure whether she needs further B12 supplementation, and if she does I don't know that she needs it to be a source other than by mouth. Will check B12 level ttoday and go from there. Orders: T-CBC w/Diff ([id number]) T-Vitamin B12 ([id number])  Problem # 3:  HYPERTENSION (ICD-401.9) Ecellent control. Continue present medications. Her updated medication list for this problem includes:    Lopressor 100 Mg Tabs (Metoprolol tartrate) .Marland Kitchen... Take 1 tablet by mouth two times a day    Lasix 20 Mg Tabs (Furosemide) .Marland Kitchen... Take 1 tablet by mouth once a day as needed    Lisinopril 40 Mg Tabs (Lisinopril) .Marland Kitchen... Take 1 tablet by mouth once a day  Problem # 4:  DIABETES MELLITUS, TYPE II (ICD-250.00) Patient is doing fine on just diet. Will check a HbA1c on return as it has just been 6 weeks today. The following medications were removed from the medication list:    Metformin Hcl 500 Mg Tabs (Metformin hcl) .Marland Kitchen... Take two tabs by mouth a day Her updated medication list for this problem includes:    Lisinopril 40 Mg Tabs (Lisinopril) .Marland Kitchen... Take 1 tablet by mouth once a day  Problem # 5:  OTHER CONSTIPATION (ICD-564.09) Assessment: Improved Her constipation has resolved using the mirilax. Will sontinue Her updated medication list for this problem includes:    Miralax Powd (Polyethylene glycol 3350) .Marland Kitchen..Marland Kitchen Two times a day  Problem # 6:  ANEMIA, IRON DEFICIENCY (ICD-280.9) Will follow  cbc.  Orders: T-CBC w/Diff ([id number])  Problem # 7:  URINARY INCONTINENCE (ICD-788.30) As per HPI this is controlled except for about one night a week. She will try to stop drinking liquids a bit earlier in the evening before she goes to bed. Will follow.  Complete Medication List: 1)  Lopressor 100 Mg Tabs (Metoprolol tartrate) .... Take 1 tablet by mouth two times a day 2)  Lasix 20 Mg Tabs (Furosemide) .... Take 1 tablet by mouth once a day as needed 3)  Lisinopril 40 Mg Tabs (Lisinopril) .Marland KitchenMarland KitchenMarland Kitchen  Take 1 tablet by mouth once a day 4)  Vesicare 10 Mg Tabs (Solifenacin succinate) .... Take 1 tablet by mouth once a day 5)  Warfarin Sodium 5 Mg Tabs (Warfarin sodium) .... Per coumadin clinic 6)  Miralax Powd (Polyethylene glycol 3350) .... Two times a day 7)  Tramadol Hcl 50 Mg Tabs (Tramadol hcl) .... Take one three times a day as needed for pain  Patient Instructions: 1)  Please schedule a follow-up appointment in 2 months. 2)  I am so glad that your weight is stable. Keep up the good work with eating. 3)  Your blood pressure is perfect today. 4)  We are checking your B12 level today and will let you know if you need to take more B12. 5)  May your holidays be wonderful!!!   Orders Added: 1)  T-CBC w/Diff [[id number]] 2)  T-Vitamin B12 [[id number]] 3)  Est. Patient Level IV [09811]   Process Orders Check Orders Results:     [facility]: ABN not required for this insurance Tests Sent for requisitioning (March 30, 2010 11:29 AM):     03/30/2010: [facility] -- T-CBC w/Diff [[id number]] (signed)     03/30/2010: [facility] -- T-Vitamin B12 [phone] (signed)     Prevention & Chronic Care Immunizations   Influenza vaccine: Not documented   Influenza vaccine deferral: Refused  (03/03/2009)    Tetanus booster: Not documented   Td booster deferral: Refused  (05/26/2009)    Pneumococcal vaccine: Not documented    Pneumococcal vaccine deferral: Refused  (05/26/2009)    H. zoster vaccine: Not documented  Colorectal Screening   Hemoccult: Negative  (03/27/2001)   Hemoccult action/deferral: Ordered  (05/26/2009)    Colonoscopy: DONE  (12/14/2009)   Colonoscopy action/deferral: GI referral  (12/15/2008)   Colonoscopy due: 11/2012  Other Screening   Pap smear: Normal  (11/27/2003)   Pap smear action/deferral: Deferred  (12/15/2008)    Mammogram: ASSESSMENT: Negative - BI-RADS 1^MM DIGITAL SCREENING  (07/02/2009)   Mammogram action/deferral: Screening mammogram in 1 year.     (05/24/2007)    DXA bone density scan: Not documented   Smoking status: quit  (03/30/2010)  Diabetes Mellitus   HgbA1C: 5.8  (02/10/2010)    Eye exam: Not documented    Foot exam: yes  (02/10/2010)   Foot exam action/deferral: Do today   High risk foot: Yes  (02/10/2010)   Foot care education: Done  (02/10/2010)    Urine microalbumin/creatinine ratio: 13.9  (05/26/2009)   Urine microalbumin action/deferral: Ordered  Lipids   Total Cholesterol: 153  (03/03/2009)   Lipid panel action/deferral: Lipid Panel ordered   LDL: 68  (03/03/2009)   LDL Direct: Not documented   HDL: 54  (03/03/2009)   Triglycerides: 153  (03/03/2009)  Hypertension   Last Blood Pressure: 116 / 76  (03/30/2010)   Serum creatinine: 1.1  (10/30/2009)   Serum potassium 4.3  (10/30/2009)  Self-Management Support :   Personal Goals (by the next clinic visit) :     Personal A1C goal: 7  (12/15/2008)     Personal blood pressure goal: 140/90  (12/15/2008)     Personal LDL goal: 100  (07/07/2009)    Patient will work on the following items until the next clinic visit to reach self-care goals:     Medications and monitoring: take my medicines every day, check my blood pressure, bring all of my medications to every visit  (03/30/2010)     Eating: drink diet soda or  water instead of juice or soda, eat more vegetables, use fresh or frozen  vegetables, eat fruit for snacks and desserts  (03/30/2010)     Activity: join a walking program  (07/07/2009)    Diabetes self-management support: Written self-care plan, Education handout, Resources for patients handout  (03/30/2010)   Diabetes care plan printed   Diabetes education handout printed    Hypertension self-management support: Written self-care plan, Education handout, Resources for patients handout  (03/30/2010)   Hypertension self-care plan printed.   Hypertension education handout printed      Resource handout printed.

## 2010-05-20 NOTE — Assessment & Plan Note (Signed)
Summary: [name]  Anticoagulant Therapy Managed by: [name]. Janie Morning  PharmD CACP PCP: [name], MD [facility] Attending: Onalee Hua MD, Manrique Indication 1: TIA/CVA Indication 2: Aftercare long term use Anticoagulants V58.61, V58.83 Start date: 04/28/2000 Duration: Indefinite  Patient Assessment Reviewed by: [name] PharmD  February 15, 2010 Medication review: verified warfarin dosage & schedule,verified previous prescription medications, verified doses & any changes, verified new medications, reviewed OTC medications, reviewed OTC health products-vitamins supplements etc Complications: none Dietary changes: none   Health status changes: none   Lifestyle changes: none   Recent/future hospitalizations: none   Recent/future procedures: none   Recent/future dental: none Patient Assessment Part 2:  Have you MISSED ANY DOSES or CHANGED TABLETS?  No missed Warfarin doses or changed tablets.  Have you had any BRUISING or BLEEDING ( nose or gum bleeds,blood in urine or stool)?  No reported bruising or bleeding in nose, gums, urine, stool.  Have you STARTED or STOPPED any MEDICATIONS, including OTC meds,herbals or supplements?  No other medications or herbal supplements were started or stopped.  Have you CHANGED your DIET, especially green vegetables,or ALCOHOL intake?  No changes in diet or alcohol intake.  Have you had any ILLNESSES or HOSPITALIZATIONS?  No reported illnesses or hospitalizations  Have you had any signs of CLOTTING?(chest discomfort,dizziness,shortness of breath,arms tingling,slurred speech,swelling or redness in leg)    No chest discomfort, dizziness, shortness of breath, tingling in arm, slurred speech, swelling, or redness in leg.     Treatment  Target INR: 2.0-3.0 INR: 1.9  Date: 02/15/2010 Regimen In:  35.0mg /week INR reflects regimen in: 1.9  New  Tablet strength: : 5mg  Regimen Out:     Sunday: 1 Tablet     Monday: 1 & 1/2 Tablet     Tuesday: 1  Tablet     Wednesday: 1 Tablet     Thursday: 1 & 1/2 Tablet      Friday: 1 Tablet     Saturday: 1 Tablet Total Weekly: 40.0mg /week mg  Next INR Due: 03/01/2010 Adjusted by: [name]. [name] PharmD CACP   Return to anticoagulation clinic:  03/01/2010 Time of next visit: 1530    Allergies: No Known Drug Allergies

## 2010-06-04 ENCOUNTER — Encounter: Payer: Self-pay | Admitting: Internal Medicine

## 2010-06-04 DIAGNOSIS — E118 Type 2 diabetes mellitus with unspecified complications: Secondary | ICD-10-CM | POA: Insufficient documentation

## 2010-06-04 DIAGNOSIS — R634 Abnormal weight loss: Secondary | ICD-10-CM | POA: Insufficient documentation

## 2010-06-04 DIAGNOSIS — M810 Age-related osteoporosis without current pathological fracture: Secondary | ICD-10-CM | POA: Insufficient documentation

## 2010-06-04 DIAGNOSIS — I4891 Unspecified atrial fibrillation: Secondary | ICD-10-CM | POA: Insufficient documentation

## 2010-06-04 DIAGNOSIS — Z8679 Personal history of other diseases of the circulatory system: Secondary | ICD-10-CM | POA: Insufficient documentation

## 2010-06-04 DIAGNOSIS — N393 Stress incontinence (female) (male): Secondary | ICD-10-CM | POA: Insufficient documentation

## 2010-06-04 DIAGNOSIS — I5042 Chronic combined systolic (congestive) and diastolic (congestive) heart failure: Secondary | ICD-10-CM | POA: Insufficient documentation

## 2010-06-04 DIAGNOSIS — Z8673 Personal history of transient ischemic attack (TIA), and cerebral infarction without residual deficits: Secondary | ICD-10-CM | POA: Insufficient documentation

## 2010-06-04 DIAGNOSIS — I10 Essential (primary) hypertension: Secondary | ICD-10-CM | POA: Insufficient documentation

## 2010-06-05 ENCOUNTER — Other Ambulatory Visit: Payer: Self-pay | Admitting: Internal Medicine

## 2010-06-07 ENCOUNTER — Ambulatory Visit: Payer: Self-pay

## 2010-06-07 ENCOUNTER — Ambulatory Visit: Payer: Self-pay | Admitting: Pharmacist

## 2010-06-08 ENCOUNTER — Telehealth: Payer: Self-pay | Admitting: Pharmacist

## 2010-06-08 ENCOUNTER — Ambulatory Visit (INDEPENDENT_AMBULATORY_CARE_PROVIDER_SITE_OTHER): Payer: Medicare HMO | Admitting: Internal Medicine

## 2010-06-08 DIAGNOSIS — K5909 Other constipation: Secondary | ICD-10-CM

## 2010-06-08 DIAGNOSIS — N393 Stress incontinence (female) (male): Secondary | ICD-10-CM

## 2010-06-08 DIAGNOSIS — M542 Cervicalgia: Secondary | ICD-10-CM

## 2010-06-08 DIAGNOSIS — I4891 Unspecified atrial fibrillation: Secondary | ICD-10-CM

## 2010-06-08 DIAGNOSIS — Z8679 Personal history of other diseases of the circulatory system: Secondary | ICD-10-CM

## 2010-06-08 DIAGNOSIS — Z7901 Long term (current) use of anticoagulants: Secondary | ICD-10-CM

## 2010-06-08 DIAGNOSIS — R634 Abnormal weight loss: Secondary | ICD-10-CM

## 2010-06-08 DIAGNOSIS — Z79899 Other long term (current) drug therapy: Secondary | ICD-10-CM

## 2010-06-08 DIAGNOSIS — E119 Type 2 diabetes mellitus without complications: Secondary | ICD-10-CM

## 2010-06-08 DIAGNOSIS — I10 Essential (primary) hypertension: Secondary | ICD-10-CM

## 2010-06-08 LAB — GLUCOSE, CAPILLARY: Glucose-Capillary: 114 mg/dL — ABNORMAL HIGH (ref 70–99)

## 2010-06-08 LAB — POCT INR: INR: 4.4

## 2010-06-08 MED ORDER — WARFARIN SODIUM 5 MG PO TABS: 5.0000 mg | ORAL_TABLET | ORAL | Status: DC

## 2010-06-08 MED ORDER — FUROSEMIDE 20 MG PO TABS: 20.0000 mg | ORAL_TABLET | Freq: Every day | ORAL | Status: DC

## 2010-06-08 MED ORDER — SOLIFENACIN SUCCINATE 10 MG PO TABS: 10.0000 mg | ORAL_TABLET | Freq: Every day | ORAL | Status: DC

## 2010-06-08 MED ORDER — POLYETHYLENE GLYCOL 3350 17 GM/SCOOP PO POWD: 17.0000 g | Freq: Two times a day (BID) | ORAL | Status: DC

## 2010-06-08 MED ORDER — LISINOPRIL 40 MG PO TABS: 40.0000 mg | ORAL_TABLET | Freq: Every day | ORAL | Status: DC

## 2010-06-08 MED ORDER — METOPROLOL TARTRATE 100 MG PO TABS: 100.0000 mg | ORAL_TABLET | Freq: Two times a day (BID) | ORAL | Status: DC

## 2010-06-08 NOTE — Telephone Encounter (Signed)
Called by Purnell Shoemaker in [facility] that the patient had been seen and discharged to home from the [facility] by Dr. Coralee Pesa. Patient had a fingerstick/point of care INR performed while in [facility]. Results = 4.4 on 45mg  warfarin per week. The patient's daughter has been called and instructed to DECREASE the patient's warfarin weekly dose from 45mg /wk to 40mg  warfarin per week by taking 1 x 5mg  warfarin tablet on Sundays, Tuesdays, Wednesdays, Fridays and Saturdays; She is to take 1 and 1/2 x 5mg  (7.5mg ) on MONDAYS and THURSDAYS. She will RTC on Monday 5-Mar-12.

## 2010-06-08 NOTE — Assessment & Plan Note (Signed)
This is doing fine with mirilax. Will refill.

## 2010-06-08 NOTE — Assessment & Plan Note (Signed)
She is only having occasional problems at night with incontinence. Vesicare refilled.

## 2010-06-08 NOTE — Assessment & Plan Note (Addendum)
Thankfully [name] has gained 7 pounds. She is eating 3 meals a day and seems to have her appetite back. I have told her that she does not need to continue gaining weight but it is fine to maintain where she is.

## 2010-06-08 NOTE — Assessment & Plan Note (Signed)
Patient is in atrial fibrillation chronically that is rate controlled. She is on chronic anti-coagulation followed by Dr. Alexandria Lodge. Her INR today is 4.4 and Dr. Alexandria Lodge has made modifications to her dosing.

## 2010-06-08 NOTE — Assessment & Plan Note (Signed)
I think that this is just some muscle strain and have suggested a heating pad or hot water bottle. I have reassured patient and her daughter that I do not think this is anything worrisome.

## 2010-06-08 NOTE — Assessment & Plan Note (Signed)
Blood pressure is well controlled today. I have refilled all of her anti-hypertensives for one year.

## 2010-06-08 NOTE — Progress Notes (Signed)
  Subjective:    Patient ID: [name], female    DOB: 12-18-1925, 77 y.o.   MRN: [id number]  HPI Please see assessment and plan for the status of the patient's chronic medical problems.     Review of Systems  Constitutional: Negative for fatigue.  Respiratory: Negative for shortness of breath.   Cardiovascular: Negative for chest pain.  Gastrointestinal: Negative for nausea, vomiting and abdominal pain.       Objective:   Physical Exam  Constitutional: She appears well-developed. No distress.  HENT:  Head: Normocephalic and atraumatic.  Cardiovascular: Normal rate and normal heart sounds.   No murmur heard. Pulmonary/Chest: Breath sounds normal.  Abdominal: Soft. Bowel sounds are normal. There is no tenderness.  Musculoskeletal: Normal range of motion. She exhibits no edema.  Skin: Skin is warm and dry.  Psychiatric: She has a normal mood and affect.  irregularly irregular heart rhythm Paraspinal mild discomfort along the  c-spine        Assessment & Plan:

## 2010-06-08 NOTE — Assessment & Plan Note (Signed)
Patient is doing fine just on diet. I stopped her metformin in 12/11 and her HbA1c is 6.3 today.

## 2010-06-08 NOTE — Patient Instructions (Addendum)
When you come to clinic in 3 months come fasting-nothing to eat after midnight. You can drink water and black coffee if you like it. Keep up the good work with eating healthily. Use a heating pad or a hot water bottle for the neck discomfort. Your blood pressure was 142/78 and your pulse was 92.

## 2010-06-17 ENCOUNTER — Other Ambulatory Visit: Payer: Self-pay | Admitting: Internal Medicine

## 2010-06-17 DIAGNOSIS — Z1231 Encounter for screening mammogram for malignant neoplasm of breast: Secondary | ICD-10-CM

## 2010-06-21 ENCOUNTER — Ambulatory Visit: Payer: Medicare HMO

## 2010-06-30 LAB — GLUCOSE, CAPILLARY: Glucose-Capillary: 116 mg/dL — ABNORMAL HIGH (ref 70–99)

## 2010-07-02 LAB — BASIC METABOLIC PANEL
BUN: 10 mg/dL (ref 6–23)
CO2: 27 mEq/L (ref 19–32)
Calcium: 9.6 mg/dL (ref 8.4–10.5)
Creatinine, Ser: 1.21 mg/dL — ABNORMAL HIGH (ref 0.4–1.2)
GFR calc Af Amer: 52 mL/min — ABNORMAL LOW (ref >60)

## 2010-07-02 LAB — GLUCOSE, CAPILLARY: Glucose-Capillary: 110 mg/dL — ABNORMAL HIGH (ref 70–99)

## 2010-07-02 LAB — CBC
MCH: 29.7 pg (ref 26.0–34.0)
MCHC: 33.1 g/dL (ref 30.0–36.0)
MCV: 89.6 fL (ref 78.0–100.0)
Platelets: 161 10*3/uL (ref 150–400)
RBC: 3.27 MIL/uL — ABNORMAL LOW (ref 3.87–5.11)
RDW: 15 % (ref 11.5–15.5)

## 2010-07-02 LAB — DIFFERENTIAL
Basophils Absolute: 0 10*3/uL (ref 0.0–0.1)
Basophils Relative: 1 % (ref 0–1)
Eosinophils Absolute: 0.1 10*3/uL (ref 0.0–0.7)
Eosinophils Relative: 1 % (ref 0–5)
Lymphs Abs: 1.9 10*3/uL (ref 0.7–4.0)
Neutrophils Relative %: 50 % (ref 43–77)

## 2010-07-02 LAB — PROTIME-INR
INR: 5.52 (ref 0.00–1.49)
Prothrombin Time: 49.9 seconds — ABNORMAL HIGH (ref 11.6–15.2)

## 2010-07-05 LAB — BASIC METABOLIC PANEL
BUN: 16 mg/dL (ref 6–23)
CO2: 25 mEq/L (ref 19–32)
CO2: 29 mEq/L (ref 19–32)
Calcium: 8.9 mg/dL (ref 8.4–10.5)
Calcium: 9 mg/dL (ref 8.4–10.5)
Chloride: 103 mEq/L (ref 96–112)
Creatinine, Ser: 1.14 mg/dL (ref 0.4–1.2)
Glucose, Bld: 91 mg/dL (ref 70–99)
Potassium: 4 mEq/L (ref 3.5–5.1)
Sodium: 137 mEq/L (ref 135–145)

## 2010-07-05 LAB — CBC
HCT: 28.1 % — ABNORMAL LOW (ref 36.0–46.0)
HCT: 28.3 % — ABNORMAL LOW (ref 36.0–46.0)
HCT: 29.8 % — ABNORMAL LOW (ref 36.0–46.0)
Hemoglobin: 9.4 g/dL — ABNORMAL LOW (ref 12.0–15.0)
Hemoglobin: 9.5 g/dL — ABNORMAL LOW (ref 12.0–15.0)
MCHC: 33.2 g/dL (ref 30.0–36.0)
MCHC: 33.7 g/dL (ref 30.0–36.0)
MCV: 96.4 fL (ref 78.0–100.0)
MCV: 96.6 fL (ref 78.0–100.0)
MCV: 96.9 fL (ref 78.0–100.0)
Platelets: 131 10*3/uL — ABNORMAL LOW (ref 150–400)
Platelets: 144 10*3/uL — ABNORMAL LOW (ref 150–400)
RDW: 15.9 % — ABNORMAL HIGH (ref 11.5–15.5)
RDW: 16.1 % — ABNORMAL HIGH (ref 11.5–15.5)
WBC: 4.4 10*3/uL (ref 4.0–10.5)
WBC: 5.1 10*3/uL (ref 4.0–10.5)

## 2010-07-05 LAB — DIFFERENTIAL
Basophils Absolute: 0 10*3/uL (ref 0.0–0.1)
Basophils Absolute: 0 10*3/uL (ref 0.0–0.1)
Basophils Relative: 1 % (ref 0–1)
Eosinophils Absolute: 0.1 10*3/uL (ref 0.0–0.7)
Eosinophils Absolute: 0.1 10*3/uL (ref 0.0–0.7)
Eosinophils Relative: 2 % (ref 0–5)
Lymphocytes Relative: 34 % (ref 12–46)
Lymphs Abs: 1.5 10*3/uL (ref 0.7–4.0)
Neutro Abs: 2.3 10*3/uL (ref 1.7–7.7)
Neutrophils Relative %: 50 % (ref 43–77)
Neutrophils Relative %: 51 % (ref 43–77)

## 2010-07-05 LAB — TROPONIN I: Troponin I: <0.01 ng/mL (ref 0.00–0.06)

## 2010-07-05 LAB — CK TOTAL AND CKMB (NOT AT ARMC)
CK, MB: 2.5 ng/mL (ref 0.3–4.0)
Relative Index: 1.9 (ref 0.0–2.5)

## 2010-07-05 LAB — BRAIN NATRIURETIC PEPTIDE: Pro B Natriuretic peptide (BNP): 342 pg/mL — ABNORMAL HIGH (ref 0.0–100.0)

## 2010-07-05 LAB — COMPREHENSIVE METABOLIC PANEL
Albumin: 3.6 g/dL (ref 3.5–5.2)
Alkaline Phosphatase: 76 U/L (ref 39–117)
BUN: 18 mg/dL (ref 6–23)
Calcium: 9.3 mg/dL (ref 8.4–10.5)
Creatinine, Ser: 1.28 mg/dL — ABNORMAL HIGH (ref 0.4–1.2)
Glucose, Bld: 105 mg/dL — ABNORMAL HIGH (ref 70–99)
Total Protein: 7.4 g/dL (ref 6.0–8.3)

## 2010-07-05 LAB — PROTIME-INR
INR: 8.93 (ref 0.00–1.49)
Prothrombin Time: 72.5 seconds — ABNORMAL HIGH (ref 11.6–15.2)

## 2010-07-05 LAB — HEMOGLOBIN A1C
Hgb A1c MFr Bld: 6.3 % — ABNORMAL HIGH (ref <5.7)
Mean Plasma Glucose: 134 mg/dL — ABNORMAL HIGH (ref <117)

## 2010-07-05 LAB — TSH: TSH: 2.084 u[IU]/mL (ref 0.350–4.500)

## 2010-07-05 LAB — GLUCOSE, CAPILLARY: Glucose-Capillary: 161 mg/dL — ABNORMAL HIGH (ref 70–99)

## 2010-07-05 LAB — URINALYSIS, MICROSCOPIC ONLY
Hgb urine dipstick: NEGATIVE
Nitrite: NEGATIVE
Protein, ur: NEGATIVE mg/dL
Urobilinogen, UA: 0.2 mg/dL (ref 0.0–1.0)

## 2010-07-05 LAB — URINE CULTURE

## 2010-07-05 LAB — APTT: aPTT: 54 seconds — ABNORMAL HIGH (ref 24–37)

## 2010-07-05 LAB — LIPID PANEL: HDL: 67 mg/dL (ref >39)

## 2010-07-06 ENCOUNTER — Ambulatory Visit (HOSPITAL_COMMUNITY)
Admission: RE | Admit: 2010-07-06 | Discharge: 2010-07-06 | Disposition: A | Discharge status: Home / Self Care | Payer: Medicare HMO | Source: Ambulatory Visit | Attending: Internal Medicine | Admitting: Internal Medicine

## 2010-07-06 DIAGNOSIS — Z1231 Encounter for screening mammogram for malignant neoplasm of breast: Secondary | ICD-10-CM | POA: Insufficient documentation

## 2010-07-11 ENCOUNTER — Other Ambulatory Visit: Payer: Self-pay | Admitting: Internal Medicine

## 2010-07-12 ENCOUNTER — Ambulatory Visit (INDEPENDENT_AMBULATORY_CARE_PROVIDER_SITE_OTHER): Payer: Medicare HMO | Admitting: Pharmacist

## 2010-07-12 DIAGNOSIS — I4891 Unspecified atrial fibrillation: Secondary | ICD-10-CM

## 2010-07-12 DIAGNOSIS — Z8679 Personal history of other diseases of the circulatory system: Secondary | ICD-10-CM

## 2010-07-12 DIAGNOSIS — Z7901 Long term (current) use of anticoagulants: Secondary | ICD-10-CM

## 2010-07-12 LAB — POCT INR: INR: 2.1

## 2010-07-12 NOTE — Patient Instructions (Signed)
Patient instructed to take medications as defined in the Anti-coagulation Track section of this encounter.  Patient instructed to take today's dose.  Patient verbalized understanding of these instructions.    

## 2010-07-12 NOTE — Progress Notes (Signed)
Anti-Coagulation Progress Note  [name] is a 77 y.o. female who is currently on an anti-coagulation regimen.    RECENT RESULTS: Recent results are below, the most recent result is correlated with a dose of 40 mg. per week: Lab Results  Component Value Date   INR 2.1 07/12/2010   INR 4.4 06/08/2010   INR 2.1 05/10/2010    ANTI-COAG DOSE:   Latest dosing instructions   Total Sun Mon Tue Wed Thu Fri Sat   42.5 5 mg 7.5 mg 5 mg 7.5 mg 5 mg 7.5 mg 5 mg    (5 mg1) (5 mg1.5) (5 mg1) (5 mg1.5) (5 mg1) (5 mg1.5) (5 mg1)         ANTICOAG SUMMARY: Anticoagulation Episode Summary              Current INR goal 2.0-3.0 Next INR check 08/09/2010   INR from last check 2.1 (07/12/2010)     Weekly max dose (mg)  Target end date Indefinite   Indications Long term current use of anticoagulant, Atrial fibrillation   INR check location [facility] Preferred lab    Send INR reminders to ANTICOAG IMP   Comments        Provider Role Specialty Phone number   Levada Schilling [facility]  Internal Medicine [phone]        ANTICOAG TODAY: Anticoagulation Summary as of 07/12/2010              INR goal 2.0-3.0     Selected INR 2.1 (07/12/2010) Next INR check 08/09/2010   Weekly max dose (mg)  Target end date Indefinite   Indications Long term current use of anticoagulant, Atrial fibrillation    Anticoagulation Episode Summary              INR check location [facility] Preferred lab    Send INR reminders to ANTICOAG IMP   Comments        Provider Role Specialty Phone number   Levada Schilling [facility]  Internal Medicine [phone]        PATIENT INSTRUCTIONS: Patient Instructions  Patient instructed to take medications as defined in the Anti-coagulation Track section of this encounter.  Patient instructed to take today's dose.  Patient verbalized understanding of these instructions.        FOLLOW-UP Return in 4 weeks (on 08/09/2010) for Follow up INR.  [name],  [name], CACP

## 2010-07-21 LAB — GLUCOSE, CAPILLARY: Glucose-Capillary: 151 mg/dL — ABNORMAL HIGH (ref 70–99)

## 2010-07-28 ENCOUNTER — Other Ambulatory Visit: Payer: Self-pay | Admitting: Internal Medicine

## 2010-08-03 LAB — GLUCOSE, CAPILLARY: Glucose-Capillary: 91 mg/dL (ref 70–99)

## 2010-08-09 ENCOUNTER — Ambulatory Visit (INDEPENDENT_AMBULATORY_CARE_PROVIDER_SITE_OTHER): Payer: Medicare HMO | Admitting: Pharmacist

## 2010-08-09 DIAGNOSIS — I4891 Unspecified atrial fibrillation: Secondary | ICD-10-CM

## 2010-08-09 DIAGNOSIS — Z7901 Long term (current) use of anticoagulants: Secondary | ICD-10-CM

## 2010-08-09 DIAGNOSIS — Z8679 Personal history of other diseases of the circulatory system: Secondary | ICD-10-CM

## 2010-08-09 LAB — POCT INR: INR: 4.5

## 2010-08-09 MED ORDER — WARFARIN SODIUM 5 MG PO TABS: ORAL_TABLET | ORAL | Status: DC

## 2010-08-09 NOTE — Progress Notes (Signed)
Anti-Coagulation Progress Note  [name] is a 77 y.o. female who is currently on an anti-coagulation regimen.    RECENT RESULTS: Recent results are below, the most recent result is correlated with a dose of 42.5 mg. per week: Lab Results  Component Value Date   INR 4.5 08/09/2010   INR 2.1 07/12/2010   INR 4.4 06/08/2010    ANTI-COAG DOSE:   Latest dosing instructions   Total Sun Mon Tue Wed Thu Fri Sat   40 5 mg 5 mg 5 mg 7.5 mg 5 mg 7.5 mg 5 mg    (5 mg1) (5 mg1) (5 mg1) (5 mg1.5) (5 mg1) (5 mg1.5) (5 mg1)         ANTICOAG SUMMARY: Anticoagulation Episode Summary              Current INR goal 2.0-3.0 Next INR check 08/23/2010   INR from last check 4.5! (08/09/2010)     Weekly max dose (mg)  Target end date Indefinite   Indications Long term current use of anticoagulant, Atrial fibrillation   INR check location [facility] Preferred lab    Send INR reminders to ANTICOAG IMP   Comments        Provider Role Specialty Phone number   Levada Schilling [facility]  Internal Medicine [phone]        ANTICOAG TODAY: Anticoagulation Summary as of 08/09/2010              INR goal 2.0-3.0     Selected INR 4.5! (08/09/2010) Next INR check 08/23/2010   Weekly max dose (mg)  Target end date Indefinite   Indications Long term current use of anticoagulant, Atrial fibrillation    Anticoagulation Episode Summary              INR check location [facility] Preferred lab    Send INR reminders to ANTICOAG IMP   Comments        Provider Role Specialty Phone number   Levada Schilling [facility]  Internal Medicine [phone]        PATIENT INSTRUCTIONS: Patient Instructions  Patient instructed to take medications as defined in the Anti-coagulation Track section of this encounter.  Patient instructed to OMIT today's dose.  Patient verbalized understanding of these instructions.        FOLLOW-UP Return in 2 weeks (on 08/23/2010) for Follow up INR.[name], [name],  CACP

## 2010-08-09 NOTE — Patient Instructions (Signed)
Patient instructed to take medications as defined in the Anti-coagulation Track section of this encounter.  Patient instructed to OMIT today's dose.  Patient verbalized understanding of these instructions.    

## 2010-08-23 ENCOUNTER — Ambulatory Visit: Payer: Medicare HMO

## 2010-08-31 ENCOUNTER — Encounter: Payer: Self-pay | Admitting: Internal Medicine

## 2010-08-31 NOTE — Discharge Summary (Signed)
[name], [name]              ACCOUNT NO.:  [id number]   MEDICAL RECORD NO.:  [id number]          PATIENT TYPE:  INP   LOCATION:  4714                         FACILITY:  [facility]   PHYSICIAN:  Hollace Hayward, M.D.   DATE OF BIRTH:  [birth year]   DATE OF ADMISSION:  12/05/2006  DATE OF DISCHARGE:  12/06/2006                               DISCHARGE SUMMARY   DISCHARGE DIAGNOSES:  1. Dizziness.  Likely secondary to dehydration.  It resolved on      discharge.  2. Tachy-brady syndrome.  Status post pacemaker.  3. Diabetes mellitus.  4. Hypertension.  5. History of cerebral vascular accident.  6. Atrial fibrillation.  7. Congestive heart failure.  8. Dysphasia.   MEDICATIONS ON DISCHARGE:  [name] was discharged on her home  medicines including:  1. Coumadin per a new regimen from Dr. Michaell Cowing, 5 mg.  Her instructions      were to take 1 and 1 1/2 tablets Monday, Thursday, Saturday, and      Sunday, and 2 tablets on Wednesday and Friday for a total of 50 mg      six days a week.  2. She was also    Dictation ended at this point.     ______________________________  Bonney Leitz    ______________________________  Hollace Hayward, M.D.    LH/MEDQ  D:  12/13/2006  T:  12/14/2006  Job:  [id number]   cc:   Manning Charity, MD

## 2010-08-31 NOTE — Assessment & Plan Note (Signed)
[facility]                         ELECTROPHYSIOLOGY OFFICE NOTE   NAME:[name], Kuklinski                     MRN:          [id number]  DATE:10/12/2006                            DOB:          10-02-1925    Ms. Briceno returns today for followup. She is a very pleasant, elderly  woman with chronic A fib, hypertension, and diabetes who returns today  for followup. She is status post pacemaker for tachybrady syndrome in  the past. She returns today for followup. She does have dyspnea with  exertion. Her heart failure symptoms are presently class 2. These are  diastolic symptoms.   PHYSICAL EXAMINATION:  GENERAL:  She is a pleasant, well-appearing,  elderly woman in no distress.  VITAL SIGNS:  Her blood pressure was 122/68, the pulse 88 and  irregularly irregular, respirations 18, weight 140 pounds.  NECK:  Revealed no jugular venous distention.  LUNGS:  Clear bilaterally to auscultation. There were no wheezes, rales  or rhonchi.  CARDIOVASCULAR:  Irregular regular rhythm with normal S1 and S2.  EXTREMITIES:  Demonstrated trace peripheral edema.   MEDICATIONS:  Coumadin, Glucophage, multivitamin, furosemide, lisinopril  and metoprolol 100 mg twice daily.   Interrogation of her pacemaker demonstrates a Medtronic Sigma with R  waves greater than 11, the impedance 721 ohms with a threshold 1 volt at  0.4 msec in the ventricle, battery voltage 2.79 volts.   IMPRESSION:  1. Symptomatic tachybrady syndrome.  2. Chronic atrial fibrillation.  3. Hypertension.  4. Chronic Coumadin therapy.   DISCUSSION:  Overall Ms. Gunnerson is stable. Her heart rate is reasonably  well controlled although not perfectly controlled and her blood pressure  is the same. Plan to see her back in 6 months.     Doylene Canning. [name], MD  Electronically Signed    GWT/MedQ  DD: 10/12/2006  DT: 10/12/2006  Job #: [id number]

## 2010-08-31 NOTE — Assessment & Plan Note (Signed)
[facility]                         ELECTROPHYSIOLOGY OFFICE NOTE   NAME:WHELESSMarvin, Holly                     MRN:          [id number]  DATE:06/10/2008                            DOB:          [birth year]    [name] returns today for followup.  She is a very pleasant elderly  woman with hypertension, atrial fibrillation, and symptomatic tachy-  brady syndrome.  She is status post pacemaker insertion.  She returns  today for followup.  The patient has had a long history of tachy  palpitations and for the most part rapid AFib, for which we have tried  to control rate with AV nodal blocking drugs including metoprolol at 100  twice a day and digoxin.  She has been stable and her heart failure  symptoms been fairly quiescent presently.  She does still have problems  with her blood pressure being elevated and is followed by Dr. Lyda Perone in  our Internal Medicine office.   MEDICINES:  1. Coumadin as directed.  2. Multiple vitamin.  3. Furosemide 20 a day.  4. Lisinopril 40 mg daily.  5. Tylenol daily.  6. Metoprolol 100 twice a day.  7. Norvasc 10 mg daily.   PHYSICAL EXAMINATION:  GENERAL:  She is a pleasant elderly woman in no  acute distress.  VITAL SIGNS:  Blood pressure was 152/74, the pulse was 87 and irregular,  respirations were 18, the weight was 200 pounds.  NECK:  No jugular venous distention.  LUNGS:  Clear bilaterally to auscultation.  No wheezes, rales, or  rhonchi are present.  There is no increased work of breathing.  CARDIOVASCULAR:  Irregular rhythm with normal S1 and S2.  There are no  obvious murmurs today.  ABDOMEN:  Soft, nontender.  EXTREMITIES:  Trace peripheral edema on the left, none on the right.   Interrogation of the pacemaker demonstrates a Medtronic Sigma.  The R-  waves were greater than 11, the impedance was 753, the threshold was 1  volt at 0.4.  Battery voltage was 2.77 volts.  She was 24% V-paced.    IMPRESSION:  1. Symptomatic tachy-brady.  2. Chronic atrial fibrillation.  3. Hypertension.  4. Status post pacemaker insertion.   DISCUSSION:  Overall, [name] is stable.  Her pacemaker has been in  now over 9 years, but working normally.  Her AFib is controlled.  She  has been maintained of course on chronic  Coumadin therapy for thromboembolic prevention.  I will plan to see the  patient back in the office in 1 year.     Doylene Canning. [name], MD  Electronically Signed    GWT/MedQ  DD: 06/10/2008  DT: 06/11/2008  Job #: [id number]   cc:   Effie Berkshire, MD

## 2010-08-31 NOTE — H&P (Signed)
[name], [name]              ACCOUNT NO.:  [id number]   MEDICAL RECORD NO.:  [id number]          PATIENT TYPE:  INP   LOCATION:  3707                         FACILITY:  [facility]   PHYSICIAN:  Chauncey Reading, D.O.  DATE OF BIRTH:  1925/08/13   DATE OF ADMISSION:  01/25/2008  DATE OF DISCHARGE:  01/29/2008                              HISTORY & PHYSICAL   DISCHARGE DIAGNOSES:  1. Mild transient ischemic attack secondary to subtherapeutic INR,      resolved.  2. Atrial fibrillation, rate controlled and on chronic Coumadin      therapy.  3. Uncomplicated cystitis, resolving.  4. Diabetes mellitus type 2 with gastroparesis and hemoglobin A1c of      6.4 on June 2009.  5. Hypertension.  6. History of cerebrovascular accident in right frontoparietal region      in 2001.  7. History of pacemaker in 2000.  8. History of constipation.  9. Urinary incontinence.  10.Mild systolic congestive heart failure with ejection fraction of      45% on 2D echo in August 2008.  11.Chronic obstructive pulmonary disease, not requiring home oxygen      therapy.  12.Allergic rhinitis.  13.History of hysterectomy.  14.History of cholecystectomy.  15.Osteoporosis.  16.Degenerative joint disease.   DISCHARGE MEDICATIONS:  1. Ampicillin 500 mg p.o. t.i.d. for one more day.  2. Colace 100 mg p.o. b.i.d.  3. Coumadin 5 mg p.o. daily.  4. Lopressor 100 mg p.o. b.i.d.  5. Lasix 20 mg p.o. daily.  6. Metformin 1000 mg p.o. b.i.d.  7. Os-Cal plus vitamin D 500 mg p.o. daily.  8. Albuterol MDI one puff q.6 h. as needed for shortness of breath.  9. Mirtazapine 15 mg p.o. nightly.  10.Lisinopril 40 mg p.o. daily.  11.Detrol LA 4 mg p.o. daily.  12.Ditropan 10 mg p.o. b.i.d.   DISPOSITION AND FOLLOWUP:  The patient is to follow up with Dr. Erline Levine  at the [facility] on February 04, 2008, at 2 p.m.  At  this time, a PT and INR should be checked and the patient's Coumadin  dose should  be adjusted as needed.  Please note that the patient was  prescribed a 3-day course of ampicillin on this admission, which may  interact with her INR levels.  However, the patient was restarted back  on her home medication dose upon discharge, as it was felt that a short  three-day course would not be sufficient enough to significantly alter  her INR, assuming she had good compliance with her Coumadin at her  previously prescribed dose.  The patient is also to follow up with Dr.  Elvera Lennox at the [facility] on February 13, 2008 at 1:30  p.m.  At this time, the patient should be assessed for any new  neurological complaints and compliance with her Coumadin therapy and her  INR levels should be followed up on.   PROCEDURES PERFORMED:  1. A carotid ultrasound was performed on January 25, 2008, showing mild      heterogenous plaque at the bifurcation, extending into the most  proximal ICA bilaterally.  No evidence of significant ICA stenosis      was noted.  Vertebral artery flow is antegrade.  2. A 2D echocardiogram performed on January 25, 2008, was relatively      unchanged from her prior study in August 2008.  Left ventricular      ejection fraction was mildly depressed at approximately 45%-50%.      There was mild aortic valvular regurgitation.  There was mild      prolapse of the anterior mitral leaflet with mitral regurg coursing      posterior through LA.  Left atrium was moderately dilated.  Right      ventricular systolic function was mildly reduced.  There was      moderate tricuspid and valvular regurgitation.  Right atrium was      mildly dilated.  3. A CT of the head without contrast was performed on January 25, 2008,      showing no acute intracranial abnormality, stable chronic ischemic      disease in the right hemisphere, and mild paranasal sinus      inflammatory changes.   BRIEF ADMITTING HISTORY AND PHYSICAL:  The patient is an 77 year old  female with  past medical history significant for CVA, chronic atrial  fibrillation requiring Coumadin therapy, hypertension, diabetes,  pacemaker placement, mild systolic congestive heart failure who presents  to the Emergency Department with chief complaint of left arm weakness  and left facial drooping.  The patient reports that at approximately 10  p.m. the night prior to admission, she could not use her left hand to  pull of her covers.  The patient then went back to sleep and woke up  this morning with increased left arm weakness and some facial drooping.  The patient reports she has missed some of her Coumadin doses.  No other  neurological symptoms were noted.  The patient denies any fevers,  chills, chest pain, palpitations, dyspnea, orthopnea, cough, nausea,  vomiting, diarrhea, dysuria, or hematuria.  The patient does, however,  admit to constipation and increased urinary frequency aside from her  baseline urinary incontinence for the past two days.   PHYSICAL EXAMINATION:  VITAL SIGNS:  The patient had a temperature of  97.8, blood pressure of 142/72, pulse of 68, respiratory rate of 20, and  sating 98% on room air.  GENERAL:  The patient was in no acute distress.  EYES:  Eye exam shows pupils equal, round, and reactive to light.  Extraocular motions intact.  Anicteric sclerae.  ENT showed moist mucous  membranes.  NECK:  Neck exam was supple with no carotid bruits.  RESPIRATORY:  Clear to auscultation bilaterally.  CARDIOVASCULAR:  Irregularly irregular rhythm.  Normal S1 and S2.  No  JVD.  No murmurs, rubs, or gallops.  GI:  The patient had positive bowel sounds and a soft, nontender, and  nondistended abdomen.  EXTREMITY:  No edema.  GENITOURINARY:  No costovertebral angle tenderness.  SKIN:  No jaundice.  NEUROLOGICAL:  The patient is alert and oriented x3.  Cranial nerves II  through XII were intact with the exception of a mild left facial droop.  Motor exam showed 5/5 strength  throughout except for the left upper  extremity, which was noted to be 4.5/5.  The patient had normal finger-  to-nose, and deep tendon reflexes were 2+ throughout.  PSYCH:  Appropriate.   LABORATORY DATA:  On admission, the patient had a white blood cell count  of 6.9  with ANC of 4.1, hemoglobin of 11.3 with an MCV of 95, and  platelets of 254.  Sodium 136, potassium 3.7, chloride 104, bicarb 24,  BUN 9, creatinine 0.8, glucose 100, total bilirubin 0.8, alk phos 103,  AST 18, ALT 10, protein 6.9, albumin 3.4, and calcium 9.0.  PT 17.7, INR  1.4, PTT 32.  CBG on admission was 84.  The patient had a dirty  urinalysis.  BNP was 324.  ESR was 39.  Hemoglobin A1c was 6.9 and TSH  was 1.48.   HOSPITAL COURSE:  1. TIA.  The patient was initially found to have a subtherapeutic INR      secondary to missed doses of her Coumadin at home per her history.      Because of her chronic atrial fibrillation and her subtherapeutic      INR dose, it was felt that this was the most likely cause of her      transient ischemic attack.  A CT of her head on admission showed no      acute abnormalities and no intracranial hemorrhage.  The patient      had no significant changes on EKG and POC markers were negative on      admission.  A carotid ultrasound was obtained, which showed no      significant stenosis of the ICA bilaterally and a 2D echocardiogram      was repeated, which was remarkable for the above, but essentially      unchanged from her echocardiogram from August 2008.  The patient      was reassessed on hospital day #2 and it was found that the patient      had no residual neurological deficits.  Therefore, a repeat CT scan      of her head was deemed unnecessary.  Throughout this time, the      patient was started on a low molecular weight heparin bridge while      her Coumadin dose was adjusted to a therapeutic INR of 2.3 on the      day of admission.  The patient's heparin bridge was  subsequently      discontinued, and the patient was instructed to continue her home      dose of warfarin upon discharge.  She was counseled on the      importance of compliance with all of her medications and is      scheduled to see Dr. Michaell Cowing at the [facility] on      February 04, 2008, for a recheck of her INR.  PT/OT was also      consulted during this admission and found the patient to be at      baseline and did not recommend any further evaluation at the time      of her discharge.  A fasting lipid profile was also obtained, which      showed the patient to have a total cholesterol of 160, HDL of 47,      LDL of 96, triglycerides of 87.  The patient was not started on any      statin therapy prior to discharge as her cholesterol was fairly      well controlled.  2. Uncomplicated cystitis.  During her admission, the patient was      found to have some suprapubic tenderness and complained of a sore      feeling in her abdomen.  This combined with a history of urinary  frequency aside from her baseline and a urine culture that grew out      group B strep was highly indicative of uncomplicated cystitis, as      the patient had no fevers and no costovertebral angle tenderness,      and no leukocytosis on admission suggestive of pyelonephritis.  The      patient was therefore started on a three-day course of amoxicillin      and completed 2 of the 3 days prior to discharge.  3. Atrial fibrillation.  The patient's heart rate was controlled with      her home dose of metoprolol.  No new medications were added, and      the patient's Coumadin therapy with the help of pharmacy.  4. Constipation.  The patient has had issues with constipation in the      past and was started on Colace while she was hospitalized.  The      patient states that she has a MiraLax at home, and this seems to      help with her constipation associated with this.  I encouraged to      continue use the  MiraLax as well as the Colace upon discharge.      This is more likely the cause of her complaints of abdominal      soreness, and the patient should be asked about her bowel regimen      upon her followup visit to the [facility].   DISCHARGE LABORATORY DATA AND VITALS:  Upon discharge, the patient had a  temperature of 97.6, blood pressure of 142/68, pulse of 62, respiratory  rate of 20, and oxygen saturation of 100% on room air.  The patient had  a white blood cell count of 4.9, hemoglobin of 4.3, platelets of 215.  Sodium 136, potassium 4.1, chloride 97, bicarb 29, BUN 13, creatinine  1.3, glucose 133, PT 26.5, PTT 47, and INR 2.3.      Lucy Antigua, MD  Electronically Signed      Chauncey Reading, D.O.  Electronically Signed    RK/MEDQ  D:  01/29/2008  T:  01/30/2008  Job:  [id number]   cc:   Dr. Michaell Cowing  Dr. Mordecai Maes

## 2010-08-31 NOTE — Assessment & Plan Note (Signed)
[facility]                         ELECTROPHYSIOLOGY OFFICE NOTE   NAME:WHELESSAlazay, Leicht                     MRN:          [id number]  DATE:06/12/2007                            DOB:          Jun 25, 1925    Ms. Leinweber returns today for followup.  She is a very pleasant elderly  woman with history of chronic A fib,  hypertension and tachy-brady  syndrome who underwent permanent pacemaker insertion  back in December  of 2000.  She returns today for followup. She has overall been stable.  She denies chest pain.  She denies shortness of breath.  Blood pressure  has been relatively well-controlled.  She has been fairly sedentary this  winter but does like to fish and look forward to the spring so she can  start back.   On exam she is a pleasant elderly woman in no distress.  Blood pressure  was 146/78, the pulse was 80 and irregularly irregular, respirations  were 18.  Weight was 141 pounds. NECK:  Revealed no jugular venous  distention.  LUNGS:  Clear bilaterally to auscultation.  No wheezes, rales or rhonchi  are present.  CARDIOVASCULAR:  Exam irregular rate and rhythm.  Normal S1-S2.  EXTREMITIES demonstrated no edema.   MEDICATIONS:  1. Coumadin as directed  2. Glucophage 1000 mg twice daily  3. Multiple vitamins  4. Furosemide 20 a day  5. Lisinopril 40 a day  6. Metoprolol 100 twice daily.   IMPRESSION:  1. Atrial fibrillation  2. Tachy-brady syndrome  3. Hypertension.  4. Status post pacemaker.   DISCUSSION:  Overall Ms. Laske is stable.  Pacemaker is working  normally.  Her blood pressure is reasonably well controlled.  I have  asked her to continue to be active and stay off salt intake and take her  medicine.  We will see her back for pacemaker check in 6 months.  Of  note, her pacemaker battery has at least 4 years left.     Doylene Canning. [name], MD  Electronically Signed    GWT/MedQ  DD: 06/12/2007  DT: 06/12/2007  Job #:  [id number]

## 2010-08-31 NOTE — Discharge Summary (Signed)
[name], [name]              ACCOUNT NO.:  [id number]   MEDICAL RECORD NO.:  [id number]          PATIENT TYPE:  INP   LOCATION:  4714                         FACILITY:  [facility]   PHYSICIAN:  Hollace Hayward, M.D.   DATE OF BIRTH:  10/29/1925   DATE OF ADMISSION:  12/05/2006  DATE OF DISCHARGE:  12/06/2006                               DISCHARGE SUMMARY   DISCHARGE DIAGNOSES:  Include:  1. Dizziness.  2. Diastolic heart failure.  3. 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  4. Hypertension.  5. Diabetes.  6. History of previous cerebrovascular accident.  7. Atrial fibrillation.  8. Dysphagia.   MEDICATIONS ON DISCHARGE:  1. [name] was sent home one her Coumadin dose per Dr. Michaell Cowing'      instructions from clinic which is 5 mg, take 1 1/2 tablet on      Tuesday, Thursday, Saturday and take two tablets on Wednesday,      Friday.  She will resume her home medicines as prescribed      including:  2. Lopressor 100 mg twice daily.  3. Lasix 20 mg once daily.  4. Metformin 1000 mg twice daily.  5. Albuterol two puffs q.6 hours as needed for shortness of breath.  6. Lisinopril 40 mg daily.  7. Ditropan 5 mg twice daily.   DISPOSITION:  Home with self-care   PROCEDURES:  1. [name] had a swallow study which was found to be negative.  2. She also had an echo which was done as an inpatient and that echo      revealed decreased posterior wall movement, left ventricular      systolic function mildly decreased with an LVEF of 45%, left      ventricular wall thickness was at the upper limits of normal, mild      aortic valvular regurgitation, mitral valve regurgitation, left      atrium dilatation, and trivial pericardial effusion.  3. She got a CT of her head which revealed no signs of intracranial      hemorrhage.   CONSULTS:  The patient received no consults while an in-patient.   HISTORY OF PRESENT ILLNESS:  [name] is a 77 year old African-  American woman with a past medical history significant for  cerebrovascular accident, atrial fibrillation, hypertension, tachybrady  syndrome status post pacemaker and is on chronic Coumadin.  She was seen  in the clinic on August 18 with an INR of 9.2 and was treated with  vitamin K.  She has been dizzy since the day before admission and it got  worse the morning of the day of admission.  She had difficulty standing  up due to dizziness.  She feels lightheaded and about to faint.  She  denies chest pain, shortness of breath, weakness or numbness, and also  denies visual problems.  Her daughter mentions that her mouth is  deviated to the right side for the last few days and is concerned for  new CVA.   VITAL SIGNS ON ADMISSION WERE DOCUMENTED FOR:  Blood pressure lying of  147/87 and pulse of 79,  sitting was 172/89, pulse of 77, and standing  was 173/83 and pulse was 83.  Other vitals signs were not documented.  GENERAL:  She was alert and awake.  HEENT:  Her pupils were equal, round and reactive to light and  accommodation.  Extraocular movements were intact.  Her oropharynx was  normal.  NECK:  Her neck was supple with no JVD.  RESPIRATORY:  Poor air entry bilaterally with no wheezes or crackles.  CARDIOVASCULAR:  S1 and S2 heard, irregular.  GI:  Positive bowel sounds.  Nontender, nondistended.  No edema.  SKIN:  Normal.  NEURO:  Cranial nerves II-XII were intact.  There is questionable facial  deviation, however, with 5/5 in all limbs.  Sensory was intact.  Deep  tendon reflexes were 1+ in all limbs.  Babinski was not present.  Cerebellar functions were intact.  Gait was normal, but patient was  dizzy when standing.   LABS ON ADMISSION:  Her CBC was done at the outpatient clinic record and  can be identified there.  Her B-Met is 137, 4.6, 104, 26, 11.9, 619  which leaves a gap of 7.  Her bilirubin is 1.8, alk phos is 100, AST is  27, protein is 7.0, albumin of 3.5 and calcium is  9.2.   On day of discharge, patient had remarked that her dizziness had  improved and she was no longer feeling dizzy, swallowing study was  passed, and she was complaining of less difficulty with dysphagia.  It  wad determined since her PT/INR had corrected, she would be able to go  home with careful followup in the outpatient clinic.  She was to see Dr.  Michaell Cowing on August 25 at 3:45 in the evening  to verify her Coumadin  levels, and she will follow up in the outpatient clinic on September 5  for a hospital followup.  On the day of discharge, her T-max was 97.6,  her pulse ranged from 66 to 84, she was breathing 24 times a minute, her  blood pressure ranged from 116 to 141 systolic and 65 to 86 diastolic,  she sat at 100% on room air.   Labs the day of discharge revealed normal cardiac enzymes.  A B-Met was  rechecked which revealed a sodium of 141, potassium 3.8, chloride 106,  CO2 28, glucose 115, BUN 10, creatinine 0.9 and calcium 9.3.  CBC on day  of discharge revealed hemoglobin 11.8, hematocrit 35.8, white count of  5.0.  Her PT on day of discharge was 18.7 which gave an INR of 1.5.  Her  hemoglobin A1c was slightly elevated at 7.1 and her TSH was 1.104.  Urinalysis was within normal limits, though there was an increased  number of leukocytes, however no other signs of urinary tract infection  were identified.   The patient's condition was good and she was discharged home, again, as  mentioned before with careful outpatient followup.     Hollace Hayward, M.D.  Electronically Signed     Hollace Hayward, M.D.  Electronically Signed   TE/MEDQ  D:  12/14/2006  T:  12/14/2006  Job:  [id number]   cc:   Manning Charity, MD

## 2010-09-02 ENCOUNTER — Encounter: Payer: Medicare HMO | Admitting: Internal Medicine

## 2010-09-03 NOTE — Discharge Summary (Signed)
[place]  Patient:    [name], [name]                     MRN: [id number] Adm. Date:  [id number] Disc. Date: [id number] Attending:  Farley Ly Dictator:   Felton Clinton, M.D.                           Discharge Summary  NO DICTATION DD:  04/29/00 TD:  04/30/00 Job: [id number]

## 2010-09-03 NOTE — Op Note (Signed)
[name], [name]              ACCOUNT NO.:  [id number]   MEDICAL RECORD NO.:  [id number]          PATIENT TYPE:  AMB   LOCATION:  SDC                           FACILITY:  [facility]   PHYSICIAN:  Roseanna Rainbow, M.D.DATE OF BIRTH:  12-Apr-1926   DATE OF PROCEDURE:  05/27/2005  DATE OF DISCHARGE:                                 OPERATIVE REPORT   PREOPERATIVE DIAGNOSIS:  Postmenopausal bleeding.   POSTOPERATIVE DIAGNOSIS:  Postmenopausal bleeding.   PROCEDURES:  1.  Examination under anesthesia.  2.  Vaginal cuff biopsy.   SURGEON:  Roseanna Rainbow, M.D.   ANESTHESIA:  Managed anesthesia care, local.   SPECIMENS:  Vaginal mucosa.   COMPLICATIONS:  None.   ESTIMATED BLOOD LOSS:  Minimal.   PROCEDURE:  The patient was taken to the OR with an IV running.  She was  placed in the dorsal lithotomy position and prepped and draped in the usual  sterile fashion.  A sterile speculum was placed in the vagina.  Of note,  there the vaginal cuff was well-visualized.  There were abraded areas from  trauma secondary to the insertion of the speculum as well as likely  synechiae that had been bluntly dissected.  The there were no gross lesions  noted.  The vaginal cuff was grasped with a straight Kocher and a biopsy was  taken of the vaginal mucosa.  The defect was then reapproximated with  interrupted sutures of 2-0 chromic.  Adequate hemostasis was noted.  The  vagina was irrigated.  Monsel's was applied to the abraded areas.  Rectovaginal exam was negative.  At the close of the procedure, the  instrument and pack counts were said to be correct x2.  The patient taken to  the PACU awake and in stable condition.      Roseanna Rainbow, M.D.  Electronically Signed     LAJ/MEDQ  D:  05/27/2005  T:  05/27/2005  Job:  [id number]

## 2010-09-03 NOTE — Op Note (Signed)
[place]  Patient:    [name]                      MRN: [id number] Proc. Date: 04/01/99 Adm. Date:  [id number] Attending:  Madaline Guthrie CC:         Madaline Guthrie, M.D.             Arsenio Loader, M.D.             Delsa Grana and Kathrine Cords             at [facility]                           Operative Report  PROCEDURE:  Implantation of a permanent pacemaker.  INDICATION:  Atrial fibrillation with rapid ventricular rates along with tachybrady syndrome on medications.  The patient is a 77 year old patient referred for evaluation and consideration or permanent pacemaker implantation.  She was admitted to the hospital approximately two months ago in atrial fibrillation with near syncope and had documented bradycardia up to 2-1/2 second sinus pauses.  She noted that on beta blockers at time she would feel extraordinarily weak and  have multiple near syncopal episodes which correlated with her episodes of bradycardia.  Her beta blocker therapy and digoxin were discontinued and her ventricular rate increased and she had no additional episodes.  She was seen in  routine visit several days ago and was found to be in atrial fibrillation with ery rapid ventricular rates while at rest.  For this reason, she was admitted to the hospital for assessment of pacemaker implantation so that her beta blocker therapy could be reinititated.  DESCRIPTION OF PROCEDURE:  After informed consent was obtained, the patient was  taken to the diagnostic EP lab in the fasted state.  After the usual preparation and draping, intravenous fentanyl and midazolam were given for conscious sedation. A total of 20 cc of lidocaine was infiltrated in the left deltopectoral space. A 7 cm incision was carried out over the left deltopectoral region and blunt dissection was utilized to isolate the left cephalic vein.  The Medtronics model [id number], 52 cm passive  fixation bipolar lead was advanced by ay of the left cephalic vein into the right ventricle.  Mapping of the right ventricle was carried out with the ventricular lead and a very nice location was found out in the RV apex with measured R-waves of 17 mV.  The ventricular pacing threshold was 0.4 V at 0.5 msec and the pacing impedance was 795 ohms.  With the lead in satisfactory position, a 10 V pacing was carried out which demonstrated no diaphragmatic stimulation.  Following this, the lead was secured with a figure-of-eight suture at the insertion of the lead into the subpectoralis fascia in the cephalic vein and then the sewing sleeve was also secured to the subpectoralis fascia with 2-0 silk suture.  Following this, a subcutaneous pocket was fashioned with electrocautery. Attention was then turned at the maintenance of hemostasis which was carried out with electrocautery as well.  Following this, the Medtronics Sigma SSR [id number], pulse generator (serial [id number]) was connected to the ventricular pacing lead and placed in the subcutaneous pocket. The generator was secured to the subpectoralis fascia with a 2-0 silk suture. Kanamycin irrigation was then used to irrigate the wound.  The wound was then closed with three layers of Vicryl suture, the first with 2-0  layer, followed by 3-0 layer, followed by 4-0 layer of Vicryl used to close the  skin.  Benzoin was painted on the skin.  Steri-Strips were applied.  Pressure dressing placed and held for five minutes.  The patient was returned to her room in good condition.  COMPLICATIONS:  None.  RESULTS:  This demonstrates successful implantation of a Medtronics single chamber pacing system in a patient with chronic atrial fibrillation and bradytachy syndrome. DD:  04/01/99 TD:  04/01/99 Job: [id number]

## 2010-09-03 NOTE — Consult Note (Signed)
[place]  Patient:    [name], [name]                     MRN: [id number] Proc. Date: 04/22/00 Adm. Date:  [id number] Disc. Date: [id number] Attending:  Armanda Heritage CC:         Farley Ly, M.D.   Consultation Report  DATE OF BIRTH:  [birth year]  REFERRING PHYSICIAN:  Farley Ly, M.D.  REASON FOR EVALUATION:  Stroke.  HISTORY OF PRESENT ILLNESS:  This is the initial inpatient consultation and evaluation of this 77 year old black female with multiple medical problems admitted on April 20, 2000 with an acute stroke.  Of note, the patient was seen in the ER the night before complaining of flu-like symptoms and shortness of breath.  She was treated with Lasix.  She had a CT of the head done at that time which was negative.  Her daughter noticed later that night she seemed to be slurring her speech.  She checked on her that next morning and noticed a left facial droop.  The patient reported also at that time a complaint of feeling "weak" all over.  She called her primary physician who advised her to go to the emergency room.  CT of the head was repeated there and revealed a hypodense area in the right frontoparietal area.  Patient was subsequently admitted.  She was also noted to be in rapid ventricular response with her chronic atrial fibrillation and this was treated with digoxin and beta blockers.  She was admitted to telemetry and ruled out for MI.  She reports being neurologically stable since admit until this morning when she noticed a new symptom of some heaviness and numbness in the left hand.  CT of the head was performed again this morning and revealed evolution of an infarct which seemed to involve the anterior territory of the right middle cerebral artery. There was no significant hemorrhage, mass effect, or midline shift noted on that scan.  We are called for advice regarding secondary stroke prevention. Of note,  the patient reports that she has been on Coumadin in the past; this was discontinued about three years ago for unclear reasons at which time she was placed on aspirin.  The chart notes that she has a history of cryptogenic falls as well as a history of overuse of alcohol.  The patient does admit to having several falls in the past but reports that she has not had a cryptogenic fall since her pacer was placed in late 2000.  She did have a fall a couple of months ago after missing a step outside of a restaurant.  She denies daily alcohol use and her daughters are in agreement on this.  REVIEW OF SYSTEMS:  Reports some mild weight gain lately, some fever prior to admission, denies chest pain, shortness of breath, weakness or numbness in the extremities, denies any history of melena or hematochezia, rest per admission HPI.  PAST MEDICAL HISTORY: 1. Chronic atrial fibrillation status post pacer placement in late 2000 for    sick sinus syndrome. 2. Hypertension. 3. Type 2 diabetes. 4. Mild restrictive pulmonary disease.  FAMILY MEDICAL HISTORY:  Noncontributory.  SOCIAL HISTORY:  She lives alone but has daughters that look after her.  She reports that she is completely independent in her activities of daily living although she does not drive.  She reports alcohol use of perhaps once or twice a week at  most.  She denies tobacco use.  ALLERGIES:  No known drug allergies.  MEDICATIONS:  Lasix, Lotensin, Glucophage, atenolol, folic acid, thiamine. She was also prescribed Tussionex in the ER the night before admission.  PHYSICAL EXAMINATION:  VITAL SIGNS:  Temperature 98.0, blood pressure 126/82, pulse 85, respirations 20.  GENERAL/MENTAL STATUS:  She is awake and alert.  She is completely oriented in no evident distress.  She has a very mild dysarthria and mild difficulty repeating though her speech is normal in content.  NECK:  Supple, without bruits.  HEART:  Irregularly  irregular, without murmurs.  NEUROLOGIC:  Cranial nerves: Funduscopic exam is benign.  Pupils are equal and briskly reactive.  Extraocular movements are normal.  Examination of visual fields reveals a left upper quadrantanopsia versus a fairly dense hemineglect. There is a mild left facial droop with weakness.  Tongue and palate move normally in the midline.  Motor exam: She has normal strength and tone in all extremities, however, there is a mild left motor hemineglect with some slowing of fine motor function.  Sensation is intact to light touch and vibratory sensation in all extremities.  Stereognosis is not performed well on either side.  Cerebellar: Finger-to-nose reveals no dysmetria but does reveal a mild postural tremor, more noted in the right hand than the left.  Reflexes are 2+ and symmetric.  Toes are downgoing.  Gait: She tends to lean a bit to the right when standing but her gait is particularly not unsteady.  She is able to heel and toe walk with standby assist but really cannot tandem walk.  She has a moderate dextroscoliosis of her spine.  LABORATORY EXAMINATION:  CT of the head from January 2, 3, and 5, 2002, are personally reviewed.  They demonstrate evolution of a bland infarct in the territory of the anterior branch of the right middle cerebral artery without significant bleed or midline shift.  Echocardiogram performed April 21, 2000 reveals no focal wall motion abnormalities and global hypokinesis with an ejection fraction of 45-50%.  IMPRESSION: 1. Right brain CVA with mild left-sided neglect and left upper    quadrantanopsia, most likely source is cardioembolic given his history of    atrial fibrillation without anticoagulation, although local atherosclerotic    disease is a possibility. 2. Chronic atrial fibrillation status post pacer placement. 3. Diabetes. 4. Hypertension. 5. Probable mild central tremor.  RECOMMENDATIONS: 1. Given the history of chronic  atrial fibrillation and multiple     comorbidities, including diabetes and hypertension, long-term    anticoagulation with Coumadin is strongly indicated.  Although she does    have a history of cryptogenic falls, she indicates that these predate her    pacer and regardless in the absence of a bleeding diathesis or clear    bleeding problem I feel this is inadequate to constitute a complete    contraindication to anticoagulation.  Therefore, I recommend initiating    heparin per stroke protocol and eventually Coumadin to target INR of 2-3. 2. Home health PT and OT evaluations to minimize her home fall risk at    discharge.  Also a rehab stay might be useful in this regard. 3. We will check carotid Doppler to rule out critical carotid stenosis. 4. I advised the patient to discontinue alcohol use. DD:  04/22/00 TD:  04/22/00 Job: [id number]

## 2010-09-03 NOTE — Discharge Summary (Signed)
[place]  Patient:    [name]                      MRN: [id number] Adm. Date:  [id number] Disc. Date: [id number] Attending:  Madaline Guthrie Dictator:   Karlene Einstein, M.D. CC:         Doylene Canning. [name], M.D. [facility]             Arsenio Loader, M.D.                           Discharge Summary  DISCHARGE DIAGNOSES: 1. Sick sinus syndrome. 2. Type 2 diabetes mellitus. 3. Hypertension. 4. Mild restrictive pulmonary disease 5. Alcohol abuse. 6. Benign breast cyst. 7. History of total abdominal hysterectomy.  PROCEDURES: 1. Chest x-ray: Stable cardiomegaly and pulmonary vascular congestion.  No acute    pulmonary process. 2. Pacemaker placement.  CONSULTANTS:  Dr. Sharrell Ku, within records cardiology.  DISCHARGE MEDICATIONS: 1. Atenolol 50 mg p.o. q.d. 2. Folic acid 1 mg p.o. q.d. 3. Thiamine 100 mg p.o. q.d. 4. Multivitamin 1 capsule p.o. q.d. 5. Enteric-coated aspirin 325 mg p.o. q.d. 6. Hydrochlorothiazide 25 mg p.o. q.d. 7. Glucophage 500 mg p.o. b.i.d. with meals. 8. Glucotrol 10 mg p.o. q.d.  FOLLOWUP:  Dr. [name] in three weeks to check the wound.  Please call and make n appointment at [phone].  HISTORY OF PRESENT ILLNESS:  A 77 year old African-American female with history of chronic atrial fibrillation presented to the outpatient clinic for regular followup and was noted to be having palpitation.  She had intermittent palpitations since her last admission in October 2000.  Some intermittent nausea but no vomiting, positive headaches, no shortness of breath, chest pain, diaphoresis, PND, orthopnea.  She denies cough, abdominal pain, melena, hematochezia, or dysuria.  PHYSICAL EXAMINATION:  VITAL SIGNS:  Temperature 99, blood pressure 147/82, pulse 112, respiratory rate 18, pulse oximetry 97% on room air.  GENERAL:  No acute distress.  HEENT:  Normocephalic, atraumatic.  Pupils are equal, round and reactive  to light and accommodation.  Extraocular muscles intact.  No icterus or pallor. Oropharynx clear and moist.  NECK:  No JVD, lymphadenopathy, or bruits.  LUNGS:  Clear to auscultation bilaterally.  HEART:  Irregular.  ABDOMEN:  Soft, nontender, nondistended.  Positive bowel sounds.  No hepatosplenomegaly.  EXTREMITIES:  No edema, +2 pulses.  NEUROLOGIC:  Alert and oriented x 3.  No focal deficits.  LABORATORY DATA:  White count 7.6, hemoglobin 12.8, platelets 190, MCV 92. Sodium 137, potassium 4, chloride 102, bicarb 28, BUN 17, creatinine 0.9, glucose 139,  total bilirubin 1.1, alkaline phosphatase 93, SGOT 39, SGPT 21, total protein 6.7, albumin 3.8, calcium 9.4.  CK 117, MB 1.1.  Alcohol less than 10.  Chest x-ray noted above.  EKG:  Atrial fibrillation at 110 beats per minute, no ischemic changes.  HOSPITAL COURSE:  #1 - SICK SINUS SYNDROME:  The patient has a history of chronic atrial fibrillation currently having palpitations.  She also has syncopal episodes and also pauses.  Cardiology, within records Dr. [name], was consulted for pacemaker placement. he underwent the procedure without any complications.  After the procedure, she was started on atenolol 50 mg p.o. q.d.  She was discharged home in stable condition. Instructions were given not to use the left arm for lifting, pushing for about ix weeks, to keep the wound dry and do not wash for seven  days.  The patient was to follow up with Dr. [name] for a wound check in about three weeks.  Her heart rate was in the 90s after ambulation.  She did not develop tachycardia.  At discharge, heart rate was around 70 to 80 with regular rate and rhythm.  She was asymptomatic.  #2 - TYPE 2 DIABETES MELLITUS:  She was continued on Glucophage and Glucotrol.  Hemoglobin A1C was 8.1.  Blood sugar was reasonably controlled on this regimen.  #3 - HYPERTENSION:  The patient was continued on hydrochlorothiazide 25 mg  p.o.  q.d.  After the pacemaker placement, she was also started on atenolol 50 mg p.o. q.d. DD:  05/26/99 TD:  05/26/99 Job: [id number]

## 2010-09-03 NOTE — Assessment & Plan Note (Signed)
[facility]                           ELECTROPHYSIOLOGY OFFICE NOTE   NAME:WHELESSKhamille, Beynon                     MRN:          [id number]  DATE:03/07/2006                            DOB:          [birth year]    Ms. Puccinelli returns today for follow up.  She is a very pleasant elderly  woman with a history of severe hypertension and tachybrady syndrome, status  post pacemaker insertion.  She continues to be bothered by atrial  fibrillation with a rapid ventricular rate associated with palpitations.  She is presently on 50 mg twice daily of Lopressor.  Despite this, she  continues to have significant palpitations.   On exam, she is a pleasant elderly woman in no acute distress.  Blood  pressure today was 130/90, the pulse was 94 and irregularly irregular.  Respirations were 18 and the weight was 141 pounds.  The neck revealed no  jugular venous distention.  Lungs were clear bilaterally to auscultation.  There were no wheezes, rales or rhonchi.  The cardiovascular exam revealed  an irregularly irregular rhythm with normal S1-S2.  The extremities  demonstrated no clubbing, cyanosis, or edema.   Interrogation of her pacemaker demonstrates a Medtronic Sigma implanted back  in December of 2000.  The R-waves were greater than 11.  The pacing  impedance was 719 ohms with threshold of 0.5 V at 0.4 msec.  She was in the  VVIR mode at 60.   IMPRESSION:  1. Hypertension.  2. Chronic atrial fibrillation.  3. Symptomatic tachybrady.  4. Status post pacemaker insertion.  5. Chronic Coumadin therapy.   DISCUSSION:  Overall, [name] is stable from an arrhythmia perspective  except that her heart rate is still too high.  I have asked today that she  increase her metoprolol from 50 twice a day up to 100 twice a day with  increments over the next two weeks.  We will plan to see her back in several  months.     Doylene Canning. [name], MD  Electronically  Signed    GWT/MedQ  DD: 03/07/2006  DT: 03/08/2006  Job #: [id number]

## 2010-09-03 NOTE — H&P (Signed)
[name], HOEFLING              ACCOUNT NO.:  [id number]   MEDICAL RECORD NO.:  [id number]          PATIENT TYPE:  AMB   LOCATION:  SDC                           FACILITY:  [facility]   PHYSICIAN:  Roseanna Rainbow, M.D.DATE OF BIRTH:  April 01, 1926   DATE OF ADMISSION:  05/27/2005  DATE OF DISCHARGE:                                HISTORY & PHYSICAL   CHIEF COMPLAINT:  The patient is a 77 year old with vaginal bleeding with a  remote history of a total abdominal hysterectomy.   HISTORY OF PRESENT ILLNESS:  Please see the above.  The patient has had  episodic spotting and vaginal discharge for several months.  She was felt to  have a desquamative vaginitis and has been treated with a 2 week regimen of  Clindesse.  However, her symptoms have persisted.   MEDICATIONS:  Glipizide, Tylenol, Calcarb/D oral tablet,  hydrochlorothiazide, Singulair, Centrum Silver, Ditropan, Coumadin, and  Lopressor.   PAST MEDICAL HISTORY:  1.  CVA.  2.  Diabetes.  3.  Osteoporosis.  4.  Heart disease.  5.  Hypertension.  6.  Urinary incontinence.  7.  Cardiac arrhythmia.  8.  Trichomoniasis.   PAST SURGICAL HISTORY:  She has had a pacemaker placed, and also please see  below.   SOCIAL HISTORY:  She is widowed.  She has no significant smoking history.  She drinks a minimal amount of alcohol.  She denies illicit drug use.   FAMILY HISTORY:  Diabetes, heart disease.   PAST GYN HISTORY:  She had a total abdominal hysterectomy at age 27  secondary to uterine fibroids.   PAST OBSTETRICAL HISTORY:  She has 7 living children.   REVIEW OF SYSTEMS:  GU:  Please see the history of present illness.   PHYSICAL EXAMINATION:  VITAL SIGNS:  Temperature 96.9, pulse 73, blood  pressure 131/66, weight 144 pounds.  GENERAL:  Well-developed, well-nourished, no apparent distress.  LUNGS:  Clear to auscultation bilaterally.  HEART:  Regular rate and rhythm.  ABDOMEN:  Soft, nontender, nondistended.  PELVIC  EXAM:  The external female genitalia are atrophic.  On speculum exam,  there is small blood and copious yellow discharge.  The vaginal cuff is not  well visualized.  Bimanual exam, there are no palpable lesions.  The adnexa  are nonpalpable.   ASSESSMENT:  Vaginal bleeding in a postmenopausal patient with a remote  history of a hydration.  Rule out any neoplastic process.   PLAN:  Examination under anesthesia, possible vaginal biopsies.      Roseanna Rainbow, M.D.  Electronically Signed     LAJ/MEDQ  D:  05/26/2005  T:  05/26/2005  Job:  [id number]   cc:   [name], M.D.  Fax: [phone]

## 2010-09-03 NOTE — Discharge Summary (Signed)
[place]  Patient:    [name], [name]                     MRN: [id number] Adm. Date:  [id number] Disc. Date: [id number] Attending:  Farley Ly Dictator:   Felton Clinton, M.D. CC:         Arsenio Loader, M.D.  [facility]  Kelli Hope, M.D.   Discharge Summary  DISCHARGE DIAGNOSES: 1. Acute right parietal cerebrovascular accident, most likely    cardioembolic. 2. Chronic atrial fibrillation. 3. Sick sinus syndrome, status post pacemaker placement in December    2000. 4. Hypertension. 5. Diabetes mellitus, type 2. 6. Mild restrictive pulmonary disease. 7. Questionable history of alcohol abuse versus use.  DISCHARGE MEDICATIONS: 1. Atenolol 50 mg one p.o. q.d. 2. Lotensin 5 mg one p.o. q.d. 3. Glucophage 500 mg one p.o. b.i.d. 4. Thiamine 100 mg one p.o. q.d. 5. Folic acid 1 mg one p.o. q.d. 6. Lasix 20 mg one p.o. q.d. 7. Aspirin one p.o. q.d. 8. Digoxin 0.25 mg one p.o. q.d. 9. Coumadin 5 mg one p.o. q.d.  FOLLOW-UP:  [name] will follow up with Dr. Hyacinth Meeker at the [facility] on Monday, May 01, 2000, at 2 oclock p.m.  At this time a potassium level, digoxin level, and PT/INR will be checked.  She will also be enrolled at the Coumadin clinic at this time.  Dr. Thad Ranger has offered to follow [name] on an outpatient basis.  I discussed this with [name] and her daughter and they will decide on this issue later.  CONSULTATION:  Dr. Thad Ranger, neurology.  PROCEDURES: 1. CT head without contrast media, April 20, 2000.  Findings consistent    with an evolving nonhemorrhagic stroke within the right frontoparietal    region.  No midline shift or mass effects are noted.  Small air-fluid    level within the left maxillary sinus. 2. Transthoracic echocardiogram, April 21, 2000.  Left ventricular systolic    function mildly decreased, with an ejection fraction of 45-50%.   Moderate    mitral valvular regurgitation.  No evidence of aortic valve stenosis. 3. CT head without contrast media, April 22, 2000.  Evolving nonhemorrhagic    stroke within the right frontoparietal region.  No evidence of midline    shift or mass effect.  A mild increase is noted in the low density    associated with this stroke since the last head CT. 4. Carotid Dopplers, April 24, 2000.  No evidence of ICA stenosis.  Vertebral    artery flow is antegrade.  HISTORY OF PRESENT ILLNESS:  [name] is a 77 year old African-American female with a history of chronic atrial fibrillation, hypertension, diabetes type 2, who is status post pacemaker placement, who presented to the emergency room department on April 20, 2000, with slurred speech and a new left facial droop.  She awoke with the facial droop this morning and had been essentially asymptomatic prior to this.  She denied chest pain, headache, shortness of breath, and fevers or chills.  She notes no motor deficits or weakness, and denies vision changes and vertigo as well.  PHYSICAL EXAMINATION:  VITAL SIGNS:  Temperature 97.9, blood pressure 118/78, pulse 127, respirations 20.  Oxygen saturations 95% on room air.  GENERAL:  Alert and oriented.  Well-nourished, well-developed African-American female in no acute distress.  HEENT:  PERRL.  EOMI.  Tympanic membranes intact.  NECK:  No  LAD.  No JVD.  HEART:  Irregularly irregular rhythm, tachy rate.  RESPIRATORY:  Clear with good air movement.  ABDOMEN:  Soft, nontender, positive bowel sounds.  EXTREMITIES:  No clubbing, cyanosis, or edema.  DP pulses 1+.  NEUROLOGICAL:  Alert and oriented x 3.  Cranial nerves:  Tongue protrudes to the right, and there is a noticeable left facial droop.  Otherwise, cranial nerves are intact.  Moves all extremities.  Grip strength 5/5 bilaterally. Plantar/dorsiflexion 5/5 bilaterally.  DTRs are 2+ and symmetric at patellar and biceps.   Babinski equivocal secondary to withdrawal.  No cerebellar signs.  ADMISSION LABORATORY DATA:  White blood cell 7.4, hemoglobin 12.2, MCV 86, platelets 253.  Sodium 136, potassium 3.6, chloride 94, bicarb 29, BUN 10, creatinine 1.1, glucose 184, total bilirubin 1.6, alkaline phosphatase 151, AST 30, ALT 17, total protein 7.5, albumin 3.5.  PT 14.3, INR 1.2, PTT 25. First set of cardiac enzymes; CK 65, MB 0.8.  ECG showed atrial fibrillation with rapid ventricular response, rate 140. Questionable inferolateral ischemia.  CT of the head:  Evolving nonhemorrhagic CVA in the right frontoparietal region.  HOSPITAL COURSE: #1 - RIGHT PARIETAL CEREBROVASCULAR ACCIDENT:  Given her history of atrial fibrillation, a cardioembolic source for her stroke was presumed.  After examination of the CT scan with the radiologist, she was started on low-dose subcutaneous heparin at 5000 cc an hour, as well as aspirin.  We were unable to obtain an MRI secondary to the patients pacemaker.  Carotid Dopplers of the internal carotid arteries were ordered, and as indicated above, were negative for signs of stenosis.  Vertebral artery flow was antegrade.  A 2-D echocardiogram was also obtained which did reveal a normal ejection fraction and no signs of aortic stenosis.  No cardioembolic source was identified. [name] did quite well the first two days of her hospitalization. However, on hospital day #3 she developed new left-sided numbness.  A repeat head CT was obtained which showed an increase in the evolving stroke since the prior head CT.  At this point a neurology consult was obtained.  Dr. Thad Ranger saw [name] on December 5 and started her on full dose heparin as well as Coumadin.  [name] tolerated heparin very well and was therapeutic on her  Coumadin on January 12 and was subsequently discharged on Coumadin.  The risks associated with Coumadin, specifically with the risks of falls associated  with alcohol abuse, and the necessity of frequent INR checks were discussed with [name] and her daughter.  They both seem to understand the need for Coumadin as well as the inherit risks involved and agreed to close followup.  #2 -  ATRIAL FIBRILLATION:  Ms. Brenton was admitted to telemetry and ruled out for MI with cardiac enzymes and ECG.  She was given a loading dose of digoxin for rate control and then put on a scheduled dose.  Her atenolol was increased to 250 mg p.o. q.d.  Although she remained in atrial fibrillation throughout her hospitalization, her rate was well-controlled.  She will be discharged on digoxin and Coumadin.  #3 - HYPERTENSION:  Ms. Lorensen blood pressure was well-controlled throughout her hospitalization.  #4 - DIABETES MELLITUS:  Ms. Probert Glucophage was held untill she was ruled out for MI.  At that time, her Glucophage regimen was restarted.  Her sugars were controlled well with the Glucophage and sliding-scale insulin.  DISPOSITION:  The mobility team was asked to evaluate Ms. Vasudevan and found that she  ambulates well and has no residual motor deficits.  A swallowing evaluation was also obtained, and Ms. Labarre demonstrated no signs of aspiration.  She is therefore discharged to home with her daughter, and will follow up with Dr. Hyacinth Meeker as indicated above.  DISCHARGE LABORATORY DATA:  White blood cell count 5.6, hemoglobin 13.2, platelets 325, MCV 89.  PT 19, INR 2.0.  Sodium 136, potassium 4.0, chloride 98, bicarb 30, glucose 165, BUN 9, creatinine 0.9, calcium 9.7.  Third set of cardiac enzymes, CK 47, CK-MB 0.7, troponin I 0.02.  Lipid profile: Cholesterol 105, triglycerides 67, HDL 32, LDL 60.  TSH 0.824.  Digoxin level April 25, 2000, 1.2. DD:  04/29/00 TD:  04/30/00 Job: [id number]

## 2010-09-07 ENCOUNTER — Ambulatory Visit (INDEPENDENT_AMBULATORY_CARE_PROVIDER_SITE_OTHER): Payer: Medicare HMO | Admitting: Internal Medicine

## 2010-09-07 ENCOUNTER — Encounter: Payer: Self-pay | Admitting: Internal Medicine

## 2010-09-07 VITALS — BP 118/60 | HR 84 | Temp 97.5°F | Ht 62.5 in | Wt 118.4 lb

## 2010-09-07 DIAGNOSIS — I10 Essential (primary) hypertension: Secondary | ICD-10-CM

## 2010-09-07 DIAGNOSIS — E119 Type 2 diabetes mellitus without complications: Secondary | ICD-10-CM

## 2010-09-07 DIAGNOSIS — I4891 Unspecified atrial fibrillation: Secondary | ICD-10-CM

## 2010-09-07 DIAGNOSIS — R634 Abnormal weight loss: Secondary | ICD-10-CM

## 2010-09-07 DIAGNOSIS — F329 Major depressive disorder, single episode, unspecified: Secondary | ICD-10-CM

## 2010-09-07 MED ORDER — MIRTAZAPINE 7.5 MG PO TABS: 7.5000 mg | ORAL_TABLET | Freq: Every day | ORAL | Status: AC

## 2010-09-07 NOTE — Assessment & Plan Note (Signed)
Patient continues to have excellent control; continue current medications.

## 2010-09-07 NOTE — Progress Notes (Signed)
  Subjective:    Patient ID: [name], female    DOB: 03/05/1926, 77 y.o.   MRN: [id number]  HPI 77 year old who comes in for 3 month followup. Patient has been feeling depressed. She lost her niece in March 2 diabetes and kidney disease. She lost another family member in January. She still is going out with her family and going to church but she just has felt down. She denies suicidal ideation. No she continues to say she has a good appetite she has lost 10 more pounds since she was last in 3 months ago. Patient does not really get much exercise. She has had a workup for her weight loss which is negative. Colonoscopy was done last year which was normal. Mammogram one year ago was negative.  Patient does follow monthly with Dr. gross on chronic anticoagulation for 77 fibrillation. Her other medical problems have been stable.  Patient is concerned because she can see the bones in her neck and follow areas below her clavicles. I assured her that this is not something worrisome but she's just sitting there is because of her weight loss.    Review of Systems  Constitutional: Negative for activity change, appetite change and fatigue.  Respiratory: Negative for shortness of breath.   Cardiovascular: Negative for chest pain.  Gastrointestinal: Negative for abdominal pain.  Musculoskeletal: Negative for arthralgias.  Neurological: Negative for dizziness.       Objective:   Physical Exam  Constitutional: She appears well-developed and well-nourished.  HENT:  Head: Normocephalic and atraumatic.  Neck: Normal range of motion. Neck supple.  Cardiovascular: Normal rate.   Pulmonary/Chest: Breath sounds normal.  Abdominal: Bowel sounds are normal. There is no tenderness.  Musculoskeletal: She exhibits no edema.          Assessment & Plan:

## 2010-09-07 NOTE — Assessment & Plan Note (Signed)
C. current plan under depression.

## 2010-09-07 NOTE — Assessment & Plan Note (Signed)
Ms. as well as does have new depression and that I believe is from loss of 2 family members in a very short period of time. She also has weight loss that is continued despite negative workup for any cancer in etiology. Because of the need for weight gain and depression I have examined her on Mirtazepine 7.5 mg at night. I will see her back in one month for followup. I told her that this can be very sedating and to take it at night.

## 2010-09-07 NOTE — Assessment & Plan Note (Signed)
Rate controlled and on chronic anticoagulation. Continue current medication regimen.

## 2010-09-07 NOTE — Assessment & Plan Note (Signed)
Hemoglobin A1c is 6.2 today and a fasting blood glucose was 80 which are both excellent. This is very well controlled with diet. Again have encouraged her with by mouth intake; hopefully the Remeron will help with this.

## 2010-09-07 NOTE — Patient Instructions (Signed)
I will see you back in 1 month. I have started you on Mirtazepine 7.5mg . Take one at night before you go to bed. This will make you sleepy. This should treat your depression and help with weight gain.

## 2010-10-06 ENCOUNTER — Encounter: Payer: Self-pay | Admitting: Internal Medicine

## 2010-10-06 ENCOUNTER — Ambulatory Visit (INDEPENDENT_AMBULATORY_CARE_PROVIDER_SITE_OTHER): Payer: Medicare HMO | Admitting: Internal Medicine

## 2010-10-06 DIAGNOSIS — I4891 Unspecified atrial fibrillation: Secondary | ICD-10-CM

## 2010-10-06 DIAGNOSIS — I495 Sick sinus syndrome: Secondary | ICD-10-CM

## 2010-10-06 DIAGNOSIS — I509 Heart failure, unspecified: Secondary | ICD-10-CM

## 2010-10-06 DIAGNOSIS — I10 Essential (primary) hypertension: Secondary | ICD-10-CM

## 2010-10-06 NOTE — Assessment & Plan Note (Signed)
Her chronic diastolic heart failure remains class II. She will continue her current medications. I've asked her to maintain a low-sodium diet.

## 2010-10-06 NOTE — Patient Instructions (Signed)
Your physician wants you to follow-up in: 12 months with Dr. Taylor. You will receive a reminder letter in the mail two months in advance. If you don't receive a letter, please call our office to schedule the follow-up appointment.    

## 2010-10-06 NOTE — Assessment & Plan Note (Signed)
Blood pressure is now much improved. She may require reduction in her current medications but will continue them for now.

## 2010-10-06 NOTE — Progress Notes (Signed)
HPI [name] returns date of followup. She is a pleasant 77 year old woman with long-standing hypertension, chronic atrial fibrillation, symptomatic tachybradycardia syndrome, status post permanent pacemaker insertion. The patient has lost weight recently. States that she is not eating as much and is not as hungry. She denies chest pain, shortness of breath, but she does have mild peripheral edema. No Known Allergies   Current Outpatient Prescriptions  Medication Sig Dispense Refill  . furosemide (LASIX) 20 MG tablet Take 1 tablet (20 mg total) by mouth daily.  31 tablet  11  . lisinopril (PRINIVIL,ZESTRIL) 40 MG tablet Take one (1) tablet(s) once daily  31 tablet  11  . mirtazapine (REMERON) 7.5 MG tablet Take 1 tablet (7.5 mg total) by mouth at bedtime.  30 tablet  0  . polyethylene glycol (MIRALAX) powder Take 17 g by mouth 2 (two) times daily.  255 g  11  . solifenacin (VESICARE) 10 MG tablet Take 1 tablet (10 mg total) by mouth daily.  31 tablet  11  . traMADol (ULTRAM) 50 MG tablet Take 50 mg by mouth 3 (three) times daily as needed.        . warfarin (COUMADIN) 5 MG tablet Take as directed by anticoagulation clinic provider.  45 tablet  3  . DISCONTD: metoprolol (LOPRESSOR) 100 MG tablet Take 1 tablet (100 mg total) by mouth 2 (two) times daily.  62 tablet  5     Past Medical History  Diagnosis Date  . Weight loss, unintentional 2010-2011    8/08-8/10 138-146 lbs, between 8/10-10/11 lost 18 lbs., work-up negative  . Incontinence overflow, stress female     on vesicare  . History of sick sinus syndrome s/p pacemaker Dr. [name] 2000  . Atrial fibrillation     on coumadin followed by Dr. Alexandria Lodge  . History of vaginal bleeding 05/2005    nagative endometrial biopsy  . Restrictive lung disease 1996    PFT's showed mild disease  . Anemia     Iron deficiency, does not tolerate po  . CHF (congestive heart failure)     diastolic dysfunction  . Diabetes mellitus     diet controlled   . Hypertension     well controlled on 3 agents  . Osteoporosis   . Stroke 2002    R MCA, cardioembolic  . Allergic rhinitis   . Onychomycosis   . History of trichomonal urethritis   . Arthritis     ROS:   All systems reviewed and negative except as noted in the HPI.   Past Surgical History  Procedure Date  . Abdominal hysterectomy   . Cholecystectomy   . Pacemaker insertion 2000, Dr. [name]  . Cataract extraction      Family History  Problem Relation Age of Onset  . Diabetes Sister      History   Social History  . Marital Status: Widowed    Spouse Name: N/A    Number of Children: N/A  . Years of Education: N/A   Occupational History  . retired    Social History Main Topics  . Smoking status: Former Games developer  . Smokeless tobacco: Not on file  . Alcohol Use: 0.0 oz/week    0 drink(s) per week     occasional use  . Drug Use: No  . Sexually Active: Not on file   Other Topics Concern  . Not on file   Social History Narrative   [employer] home# [phone]# [phone] lives alone, but has 7, of 8,  living children who check in on her.She is widowed.     BP 119/71  Pulse 73  Resp 16  Ht 5' (1.524 m)  Wt 123 lb (55.792 kg)  BMI 24.02 kg/m2  Physical Exam:  ill appearing NAD HEENT: Unremarkable Neck:  No JVD, no thyromegally Lymphatics:  No adenopathy Back:  No CVA tenderness Lungs:  Clear. Well-healed pacemaker incision. HEART:  Regular rate rhythm, no murmurs, no rubs, no clicks Abd:  positive bowel sounds, no organomegally, no rebound, no guarding Ext:  2 plus pulses, no edema, no cyanosis, no clubbing Skin:  No rashes no nodules Neuro:  CN II through XII intact, motor grossly intact  DEVICE  Normal device function.  See PaceArt for details.   Assess/Plan:

## 2010-10-06 NOTE — Assessment & Plan Note (Signed)
Her ventricular rate has for the most part been controlled. Over the last few weeks, her daughter notes that her rates have been higher than usual. She will continue her low-dose beta blocker for this.

## 2010-10-16 ENCOUNTER — Encounter: Payer: Self-pay | Admitting: Internal Medicine

## 2010-10-16 ENCOUNTER — Inpatient Hospital Stay (HOSPITAL_COMMUNITY)
Admission: EM | Admit: 2010-10-16 | Discharge: 2010-10-18 | DRG: 066 | Disposition: A | Discharge status: Home / Self Care | Payer: Medicare HMO | Attending: Internal Medicine | Admitting: Internal Medicine

## 2010-10-16 ENCOUNTER — Emergency Department (HOSPITAL_COMMUNITY): Payer: Medicare HMO

## 2010-10-16 DIAGNOSIS — I4891 Unspecified atrial fibrillation: Secondary | ICD-10-CM | POA: Diagnosis present

## 2010-10-16 DIAGNOSIS — I6350 Cerebral infarction due to unspecified occlusion or stenosis of unspecified cerebral artery: Principal | ICD-10-CM | POA: Diagnosis present

## 2010-10-16 DIAGNOSIS — I10 Essential (primary) hypertension: Secondary | ICD-10-CM | POA: Diagnosis present

## 2010-10-16 DIAGNOSIS — R32 Unspecified urinary incontinence: Secondary | ICD-10-CM | POA: Diagnosis present

## 2010-10-16 DIAGNOSIS — E119 Type 2 diabetes mellitus without complications: Secondary | ICD-10-CM | POA: Diagnosis present

## 2010-10-16 LAB — URINALYSIS, ROUTINE W REFLEX MICROSCOPIC
Bilirubin Urine: NEGATIVE
Ketones, ur: NEGATIVE mg/dL
Nitrite: NEGATIVE
Protein, ur: NEGATIVE mg/dL
Specific Gravity, Urine: 1.014 (ref 1.005–1.030)
Urobilinogen, UA: 1 mg/dL (ref 0.0–1.0)

## 2010-10-16 LAB — CBC
HCT: 26.1 % — ABNORMAL LOW (ref 36.0–46.0)
Hemoglobin: 8.9 g/dL — ABNORMAL LOW (ref 12.0–15.0)
MCV: 85.9 fL (ref 78.0–100.0)
Platelets: 196 10*3/uL (ref 150–400)
RBC: 3.04 MIL/uL — ABNORMAL LOW (ref 3.87–5.11)
WBC: 6.6 10*3/uL (ref 4.0–10.5)

## 2010-10-16 LAB — CK TOTAL AND CKMB (NOT AT ARMC)
CK, MB: 2.1 ng/mL (ref 0.3–4.0)
Relative Index: INVALID (ref 0.0–2.5)
Total CK: 88 U/L (ref 7–177)

## 2010-10-16 LAB — DIFFERENTIAL
Lymphocytes Relative: 47 % — ABNORMAL HIGH (ref 12–46)
Lymphs Abs: 3.1 10*3/uL (ref 0.7–4.0)
Neutro Abs: 2.6 10*3/uL (ref 1.7–7.7)
Neutrophils Relative %: 40 % — ABNORMAL LOW (ref 43–77)

## 2010-10-16 LAB — COMPREHENSIVE METABOLIC PANEL
ALT: 12 U/L (ref 0–35)
AST: 26 U/L (ref 0–37)
Albumin: 3.4 g/dL — ABNORMAL LOW (ref 3.5–5.2)
CO2: 22 mEq/L (ref 19–32)
Calcium: 9.5 mg/dL (ref 8.4–10.5)
Creatinine, Ser: 0.94 mg/dL (ref 0.50–1.10)
Sodium: 138 mEq/L (ref 135–145)
Total Protein: 7.5 g/dL (ref 6.0–8.3)

## 2010-10-16 LAB — GLUCOSE, CAPILLARY: Glucose-Capillary: 108 mg/dL — ABNORMAL HIGH (ref 70–99)

## 2010-10-16 LAB — TROPONIN I: Troponin I: <0.3 ng/mL (ref <0.30)

## 2010-10-16 LAB — APTT: aPTT: 34 seconds (ref 24–37)

## 2010-10-16 NOTE — H&P (Signed)
Hospital Admission Note Date: 10/16/2010  Patient name: [name] Medical record number: [id number] Date of birth: 1925/07/15 Age: 77 y.o. Gender: female PCP: Zoila Shutter, MD  Medical Service:  Attending physician:  Dr. Josem Kaufmann Resident (R1):  Dr. Patricia Nettle  Pager: [phone] Resident (R3):  Dr. Baltazar Apo              Pager: [phone]  Chief Complaint: Right sided weakness.  History of Present Illness: This is 77 year old female with past medical history for CVA in 2002, Afib on warfarin, sick sinus syndrome s/p pacemaker , Diabetes who present to the the ED with right sided weakness. Patient , patient's daughter and Consuello Bossier provided the history. The grandson noted that he was talking to the patient on the phone at 4:30 pm and she was in her usual state. After that the patient noted weakness in her right hand. She was not able to hold her cup. She notified her daughter who came and noted some changes in speech and some facial dysmetria. Patient reports that it felt same like the stroke she had prior. Patient denies any loss of consciousness, headache, vision changes, hearing loss, numbness, tingling, chest pain, sob. Denies any recent falls.  She noted she does have some chronic urinary incontinence and constipation.   Current Outpatient Prescriptions  Medication Sig Dispense Refill  . furosemide (LASIX) 20 MG tablet Take 1 tablet (20 mg total) by mouth daily.  31 tablet  11  . lisinopril (PRINIVIL,ZESTRIL) 40 MG tablet Take one (1) tablet(s) once daily  31 tablet  11  . metoprolol (LOPRESSOR) 100 MG tablet       . mirtazapine (REMERON) 15 MG tablet       . polyethylene glycol (MIRALAX) powder Take 17 g by mouth 2 (two) times daily.  255 g  11  . solifenacin (VESICARE) 10 MG tablet Take 1 tablet (10 mg total) by mouth daily.  31 tablet  11  . traMADol (ULTRAM) 50 MG tablet Take 50 mg by mouth 3 (three) times daily as needed.        . warfarin (COUMADIN) 5 MG tablet Take as  directed by anticoagulation clinic provider.  45 tablet  3    Allergies: Review of patient's allergies indicates no known allergies.  Past Medical History  Diagnosis Date  . Weight loss, unintentional 2010-2011    8/08-8/10 138-146 lbs, between 8/10-10/11 lost 18 lbs., work-up negative  . Incontinence overflow, stress female     on vesicare  . History of sick sinus syndrome s/p pacemaker Dr. [name] 2000  . Atrial fibrillation     on coumadin followed by Dr. Alexandria Lodge  . History of vaginal bleeding 05/2005    nagative endometrial biopsy  . Restrictive lung disease 1996    PFT's showed mild disease  . Anemia     Iron deficiency, does not tolerate po  . CHF (congestive heart failure)     diastolic dysfunction  . Diabetes mellitus     diet controlled  . Hypertension     well controlled on 3 agents  . Osteoporosis   . Stroke 2002    R MCA, cardioembolic  . Allergic rhinitis   . Onychomycosis   . History of trichomonal urethritis   . Arthritis     Past Surgical History  Procedure Date  . Abdominal hysterectomy   . Cholecystectomy   . Pacemaker insertion 2000, Dr. [name]  . Cataract extraction     Family History  Problem Relation Age  of Onset  . Diabetes Sister     History   Social History  . Marital Status: Widowed    Spouse Name: N/A    Number of Children: N/A  . Years of Education: N/A   Occupational History  . retired    Social History Main Topics  . Smoking status: Former Games developer  . Smokeless tobacco: Not on file  . Alcohol Use: 0.0 oz/week    0 drink(s) per week     occasional use  . Drug Use: No  . Sexually Active: Not on file   Other Topics Concern  . Not on file   Social History Narrative   [employer] home# [phone]# [phone] lives alone, but has 7, of 8, living children who check in on her.She is widowed.    Review of Systems: Negative except per HPI  Physical Exam: Vitals: T= 99.0,  BP=122/43, HR=68, RR= 20 , O2  Sat= 99% on RA. General:  alert, well-developed, and cooperative to examination.   Head:  normocephalic and atraumatic, facial droop noted on left side.   Eyes:  vision grossly intact, pupils equal, pupils round, pupils reactive to light, no injection and anicteric.   Mouth:  pharynx pink and moist, no erythema, and no exudates.   Neck:  supple, full ROM, no thyromegaly, no JVD, and no carotid bruits.   Lungs:  normal respiratory effort, no accessory muscle use, normal breath sounds, no crackles, and no wheezes. Heart: Tachycardic, irregularly irregular, 2/6 systolic murmur at LUSB, no gallop, and no rub.   Abdomen:  soft, non-tender, normal bowel sounds, no distention, no guarding, no rebound tenderness, no hepatomegaly, and no splenomegaly.   Msk:  no joint swelling, no joint warmth, and no redness over joints.   Pulses:  2+ DP/PT pulses bilaterally Extremities:  No cyanosis, clubbing, edema Neurologic:  alert & oriented X3, cranial nerves II-XII intact, 4/5 strength in right upper limb, 5/5 in left upper limb, sensation intact to light touch. Gait not assessed.   Skin:  turgor normal and no rashes.   Psych:  Oriented X3, memory intact for recent and remote, normally interactive, good eye contact, not anxious appearing, and not depressed appearing.  Lab results: Admission on 10/16/2010  Component Value Range  . Glucose-Capillary (mg/dL) 191* 47-82  . Neutrophils Relative (%) 40* 43-77  . Neutro Abs (K/uL) 2.6  1.7-7.7  . Lymphocytes Relative (%) 47* 12-46  . Lymphs Abs (K/uL) 3.1  0.7-4.0  . Monocytes Relative (%) 10  3-12  . Monocytes Absolute (K/uL) 0.7  0.1-1.0  . Eosinophils Relative (%) 2  0-5  . Eosinophils Absolute (K/uL) 0.1  0.0-0.7  . Basophils Relative (%) 1  0-1  . Basophils Absolute (K/uL) 0.0  0.0-0.1  . WBC (K/uL) 6.6  4.0-10.5  . RBC (MIL/uL) 3.04* 3.87-5.11  . Hemoglobin (g/dL) 8.9* 95.6-21.3  . HCT (%) 26.1* 36.0-46.0  . MCV (fL) 85.9  78.0-100.0  . MCH (pg)  29.3  26.0-34.0  . MCHC (g/dL) 08.6  57.8-46.9  . RDW (%) 18.0* 11.5-15.5  . Platelets (K/uL) 196  150-400  . Prothrombin Time (seconds) 25.4* 11.6-15.2  . INR  2.27* 0.00-1.49  . aPTT (seconds) 34  24-37     Imaging results:  CT Head without contrast Findings:   Generalized atrophy.   Normal ventricular morphology.   No midline shift or mass effect.   Old right parietal infarct.   No intracranial hemorrhage, mass lesion or evidence of acute   infarction.  Small vessel chronic ischemic changes of deep cerebral white matter   right hemisphere stable.   No extra-axial fluid collections.   Atherosclerotic calcification of internal carotid vertebral   arteries at skull base.   Visualized paranasal sinuses and mastoid air cells clear.   Bones unremarkable.    IMPRESSION:   Atrophy with small vessel chronic ischemic changes of deep cerebral   white matter.   Old right parietal infarct.   No acute intracranial abnormalities.   Assessment & Plan by Problem:  1. STROKE, Patient has history of multiple embolic strokes in the past is on warfarin with an INR 2.27 today. Right-sided arm weakness: At this point it appears that this patient may have had another acute stroke given the relatively sudden onset of right-sided arm weakness, slurred speech, and left facial droop. This occurred at around 5pm on 10/16/10. Neuro have been consulted in the ED, TPA was not given because patient is on warfarin with a therapeutic INR. Non-contrast CT of the head is negative for acute bleed. It also does not show acute stroke, but this is possible in acute stroke. Will order MRI head and other stroke core measures such as swallow eval and PT/OT consult. Note when ischemic stroke occurs with a therapeutic INR (2.0 to 3.0), it is often "lacunar" related to cerebral small artery disease, rather than cardioembolic etiology. Nevertheless, it is favored to increase the target INR to 2.5 to 3.5, rather than routine  addition of antiplatelet therapy. The addition of antiplatelet therapy is known to increase major hemorrhage (and particularly brain hemorrhage) and the benefit is less well defined.  2. ATRIAL FIBRILLATION: With rate of 100-110 in the ED. CHADS2 score is 5-6, She anticoagulated on warfarin with target INR 2-3 and rate controlled on metoprolol 100 BID, given her likely acute stroke will reduce her metoprolol dose for 25 po bid for at least 24 hours to allow permissive HTN, then increase to her home dose. Contine warfarin with increased INR target of 2.5-3.  3. DM II: HgbA1C 6.2 (08/2010). Diet controlled. Will check CBG qACHS, if needed we will add SSI.   4. HYPERTENSION: Well controlled. given her likely acute stroke will hold her home lisinopril and reduce her metoprolol dose for 25 po bid for at least 24 hours to allow permissive HTN, then restart lisinopril and increase metoprolol to her home dose.  5. Urinary Incontinence: Continue Vesicare   6. VTE: patient is on warfarin with a therapeutic INR.     PGY2______________________________________    PGY1______________________________________    ATTENDING: I performed and/or observed a history and physical examination of the patient.  I discussed the case with the residents as noted and reviewed the residents' notes.  I agree with the findings and plan--please refer to the attending physician note for more details.  Signature________________________________  Printed Name_____________________________

## 2010-10-17 DIAGNOSIS — I6350 Cerebral infarction due to unspecified occlusion or stenosis of unspecified cerebral artery: Secondary | ICD-10-CM

## 2010-10-17 LAB — GLUCOSE, CAPILLARY
Glucose-Capillary: 119 mg/dL — ABNORMAL HIGH (ref 70–99)
Glucose-Capillary: 127 mg/dL — ABNORMAL HIGH (ref 70–99)
Glucose-Capillary: 112 mg/dL — ABNORMAL HIGH (ref 70–99)
Glucose-Capillary: 222 mg/dL — ABNORMAL HIGH (ref 70–99)

## 2010-10-17 LAB — HEPATIC FUNCTION PANEL
ALT: 12 U/L (ref 0–35)
AST: 25 U/L (ref 0–37)
Bilirubin, Direct: 0.1 mg/dL (ref 0.0–0.3)
Indirect Bilirubin: 0.3 mg/dL (ref 0.3–0.9)
Total Protein: 6.7 g/dL (ref 6.0–8.3)

## 2010-10-17 LAB — CBC
MCH: 28.9 pg (ref 26.0–34.0)
MCHC: 33.6 g/dL (ref 30.0–36.0)
MCV: 86.1 fL (ref 78.0–100.0)
Platelets: 176 10*3/uL (ref 150–400)
RDW: 18.2 % — ABNORMAL HIGH (ref 11.5–15.5)

## 2010-10-17 LAB — BASIC METABOLIC PANEL
CO2: 26 mEq/L (ref 19–32)
Chloride: 103 mEq/L (ref 96–112)
GFR calc Af Amer: >60 mL/min (ref >60)
Sodium: 138 mEq/L (ref 135–145)

## 2010-10-17 LAB — LIPID PANEL
LDL Cholesterol: 36 mg/dL (ref 0–99)
VLDL: 32 mg/dL (ref 0–40)

## 2010-10-17 LAB — PROTIME-INR: Prothrombin Time: 27 seconds — ABNORMAL HIGH (ref 11.6–15.2)

## 2010-10-17 LAB — HEMOGLOBIN A1C: Mean Plasma Glucose: 148 mg/dL — ABNORMAL HIGH (ref <117)

## 2010-10-17 LAB — HOMOCYSTEINE: Homocysteine: 19.8 umol/L — ABNORMAL HIGH (ref 4.0–15.4)

## 2010-10-17 NOTE — Consult Note (Signed)
[name], [name] NO.:  [id number]  MEDICAL RECORD NO.:  [id number]  LOCATION:  [facility]                         FACILITY:  [facility]  PHYSICIAN:  Mont Dutton, MD    DATE OF BIRTH:  Aug 07, 1925  DATE OF CONSULTATION:  10/16/2010 DATE OF DISCHARGE:                                CONSULTATION   REQUESTING PHYSICIAN:  Nelva Nay, MD  REASON FOR CONSULTATION:  Stroke code.  HISTORY OF PRESENT ILLNESS:  The patient is an 77 year old African American female with past medical history of TIAs, strokes with full resolution, status post pacemaker, atrial fibrillation, hypertension, hyperlipidemia, and diabetes who presents with the acute onset of right hand weakness.  Her daughter went to visit her this afternoon and left at approximately 5:15 p.m.  She called her daughter at approximately 7 o'clock p.m. saying "I think I had a stroke in my right hand."  The patient states that the symptoms started soon after the daughter left at approximately 5:15 p.m.  Therefore, it appears that her last known normal time is 5:15 p.m.  It is not clear why she waited several hours to call the daughter with her deficits.  She says that she was dropping objects with her right hand.  She did not notice any other deficits. Her family states that her speech is more slurred, and she appears confused.  She has had several strokes in the past but has had full recovery from them.  She takes Coumadin for atrial fibrillation.  She states that her doctor stopped her Lipitor several months ago for unknown reasons.  REVIEW OF SYSTEMS:  A complete 14-point review of systems is negative other than what is mentioned in the history of present illness.  PAST MEDICAL HISTORY: 1. TIAs. 2. Strokes. 3. Status post pacemaker. 4. Hypertension. 5. Diabetes. 6. Hyperlipidemia. 7. Atrial fibrillation. 8. She also has a history of COPD, congestive heart failure,     osteoporosis, and degenerative  joint disease.  HOME MEDICATIONS:  Lisinopril, metoprolol, Coumadin, VESIcare, Lasix.  ALLERGIES:  No known drug allergies.  FAMILY HISTORY:  Noncontributory.  SOCIAL HISTORY:  Denies smoking, alcohol, or illicits.  She lives alone. She has several daughters that help and support her.  PHYSICAL EXAMINATION:  GENERAL:  Well, in no apparent distress, elderly. HEENT:  Mucous membranes are slightly dry. CARDIOVASCULAR:  Irregularly irregular and tachycardic. RESPIRATORY:  Clear anteriorly. ABDOMEN:  Not soft. SKIN:  No rashes or lesion. NEUROLOGIC:  Mental Status:  Alert and oriented x4, naming and repetition are intact, mild deficits with comprehension, moderate dysarthria, speech is fluent. Cranial Nerves:  Pupils equal, round, and reactive to light, full visual fields.  Extraocular movements intact.  No facial or sensory deficits. Facial expressions are largely symmetric but there is mild right lower facial droop, shoulder shrug 5/5 bilaterally, tongue midline. Motor:  No pronator drift.  Strength is 5/5 in the left upper extremity and bilateral lower extremities.  Strength in the right upper extremity is 5/5 at the deltoid, 4+/5 with elbow flexion and extension, and 4-/5 with grip. Coordination:  No ataxia with finger-to-nose. Sedation:  No deficits to light touch, temperature, or pinprick. Deep Tendon Reflexes:  2+/4 throughout  with toes downgoing bilaterally. Gait:  Not tested.  LABORATORY DATA: 1. Urinalysis is negative. 2. CBC is significant for anemia with hematocrit of 26.  Platelet     count is 196. 3. INR is 2.3. 4. PTT 34. 5. BMP is significant for mild glucose elevation. 6. AST and ALT are within normal limits. 7. Cardiac enzymes are negative.  ASSESSMENT AND PLAN:  [name] is an 77 year old female with a past medical history of a stroke and several stroke risk factors including hypertension, hyperlipidemia, diabetes, and atrial fibrillation on Coumadin who  presents with likely recurrent left subcortical stroke. She was not a candidate for tissue plasminogen activator since she is on Coumadin and her INR is 2.3.  RECOMMENDATIONS: 1. Admit to the hospitalist service. 2. MRI cannot be performed since she has a pacemaker. 3. Obtain carotid Dopplers. 4. Obtain 2-D echocardiogram without a bubble study. 5. Check lipid panel and homocysteine level in the morning. 6. We will need to determine why her primary care provider stopped her     Lipitor. 7. It is okay for her to continue Coumadin.  There is a risk of hemorrhagic trasnformation of the stroke, but this risk is likely to be very small given that she probably has only a small infarct. 8. Hold Lasix and Lisinopril medications and allow for permissive     hypertension.  Can use p.r.n. labetalol or hydralazine for blood     pressure greater than 200/120. 9. Continue Metoprolol 10. PT/OT. 11. Obtain bedside swallow evaluation.  Thank you for this consultation.  We will continue to follow.          ______________________________ Mont Dutton, MD     CS/MEDQ  D:  10/16/2010  T:  10/16/2010  Job:  [id number]  Electronically Signed by Mont Dutton MD on 10/17/2010 07:02:04 AM

## 2010-10-18 ENCOUNTER — Inpatient Hospital Stay (HOSPITAL_COMMUNITY): Payer: Medicare HMO

## 2010-10-18 DIAGNOSIS — I359 Nonrheumatic aortic valve disorder, unspecified: Secondary | ICD-10-CM

## 2010-10-18 LAB — BASIC METABOLIC PANEL
BUN: 12 mg/dL (ref 6–23)
CO2: 28 mEq/L (ref 19–32)
Calcium: 9.3 mg/dL (ref 8.4–10.5)
Chloride: 103 mEq/L (ref 96–112)
Creatinine, Ser: 0.87 mg/dL (ref 0.50–1.10)
Glucose, Bld: 144 mg/dL — ABNORMAL HIGH (ref 70–99)

## 2010-10-18 LAB — CBC
HCT: 26.4 % — ABNORMAL LOW (ref 36.0–46.0)
MCH: 28.9 pg (ref 26.0–34.0)
MCV: 86.8 fL (ref 78.0–100.0)
Platelets: 184 10*3/uL (ref 150–400)
RBC: 3.04 MIL/uL — ABNORMAL LOW (ref 3.87–5.11)
WBC: 5 10*3/uL (ref 4.0–10.5)

## 2010-10-18 LAB — FOLATE: Folate: 9.3 ng/mL

## 2010-10-18 LAB — IRON AND TIBC
Iron: 24 ug/dL — ABNORMAL LOW (ref 42–135)
Saturation Ratios: 7 % — ABNORMAL LOW (ref 20–55)
UIBC: 310 ug/dL

## 2010-10-18 LAB — GLUCOSE, CAPILLARY: Glucose-Capillary: 132 mg/dL — ABNORMAL HIGH (ref 70–99)

## 2010-10-19 ENCOUNTER — Telehealth: Payer: Self-pay | Admitting: Internal Medicine

## 2010-10-19 DIAGNOSIS — D649 Anemia, unspecified: Secondary | ICD-10-CM

## 2010-10-19 MED ORDER — FERROUS SULFATE 325 (65 FE) MG PO TABS: 325.0000 mg | ORAL_TABLET | Freq: Every day | ORAL | Status: DC

## 2010-10-19 NOTE — Telephone Encounter (Signed)
Pt is anemic.  Iron was not prescribed iron upon hospital d/c yesterday, so have prescribed.  Will contact pt to discuss.

## 2010-10-25 ENCOUNTER — Ambulatory Visit (INDEPENDENT_AMBULATORY_CARE_PROVIDER_SITE_OTHER): Payer: Medicare HMO | Admitting: Pharmacist

## 2010-10-25 DIAGNOSIS — Z7901 Long term (current) use of anticoagulants: Secondary | ICD-10-CM

## 2010-10-25 DIAGNOSIS — Z8679 Personal history of other diseases of the circulatory system: Secondary | ICD-10-CM

## 2010-10-25 DIAGNOSIS — I4891 Unspecified atrial fibrillation: Secondary | ICD-10-CM

## 2010-10-25 LAB — POCT INR: INR: 2

## 2010-10-25 NOTE — Progress Notes (Signed)
Anti-Coagulation Progress Note  [name] is a 77 y.o. female who is currently on an anti-coagulation regimen.    RECENT RESULTS: Recent results are below, the most recent result is correlated with a dose of 40 mg. per week: Lab Results  Component Value Date   INR 2.0 10/25/2010   INR 3.41* 10/18/2010   INR 2.45* 10/17/2010    ANTI-COAG DOSE:   Latest dosing instructions   Total Sun Mon Tue Wed Thu Fri Sat   45 5 mg 7.5 mg 7.5 mg 7.5 mg 5 mg 7.5 mg 5 mg    (5 mg1) (5 mg1.5) (5 mg1.5) (5 mg1.5) (5 mg1) (5 mg1.5) (5 mg1)         ANTICOAG SUMMARY: Anticoagulation Episode Summary              Current INR goal 2.0-3.0 Next INR check 11/08/2010   INR from last check 2.0 (10/25/2010)     Weekly max dose (mg)  Target end date Indefinite   Indications Long term current use of anticoagulant, Atrial fibrillation   INR check location [facility] Preferred lab    Send INR reminders to ANTICOAG IMP   Comments        Provider Role Specialty Phone number   Levada Schilling [facility]  Internal Medicine [phone]        ANTICOAG TODAY: Anticoagulation Summary as of 10/25/2010              INR goal 2.0-3.0     Selected INR 2.0 (10/25/2010) Next INR check 11/08/2010   Weekly max dose (mg)  Target end date Indefinite   Indications Long term current use of anticoagulant, Atrial fibrillation    Anticoagulation Episode Summary              INR check location [facility] Preferred lab    Send INR reminders to ANTICOAG IMP   Comments        Provider Role Specialty Phone number   Levada Schilling [facility]  Internal Medicine [phone]        PATIENT INSTRUCTIONS: Patient Instructions  Patient instructed to take medications as defined in the Anti-coagulation Track section of this encounter.  Patient instructed to take today's dose.  Patient verbalized understanding of these instructions.        FOLLOW-UP Return in 2 weeks (on 11/08/2010) for Follow up INR.  [name],  [name], CACP

## 2010-10-25 NOTE — Patient Instructions (Signed)
Patient instructed to take medications as defined in the Anti-coagulation Track section of this encounter.  Patient instructed to take today's dose.  Patient verbalized understanding of these instructions.    

## 2010-11-01 ENCOUNTER — Encounter: Payer: Self-pay | Admitting: Internal Medicine

## 2010-11-06 NOTE — Discharge Summary (Signed)
[name], [name] NO.:  [id number]  MEDICAL RECORD NO.:  [id number]  LOCATION:  3016                         FACILITY:  [facility]  PHYSICIAN:  Doneen Poisson, MD     DATE OF BIRTH:  [birth year]  DATE OF ADMISSION:  10/16/2010 DATE OF DISCHARGE:  10/18/2010                              DISCHARGE SUMMARY   PRIMARY CARE PHYSICIAN:  Dr. Coralee Pesa of the [facility].  DISCHARGE DIAGNOSES: 1. Stroke. 2. Atrial fibrillation. 3. Diabetes, type 2. 4. Hypertension. 5. Urinary incontinence.  DISCHARGE MEDICATIONS: 1. Metoprolol 25 mg b.i.d. 2. Lisinopril 40 mg daily. 3. VESIcare 10 mg daily. 4. Warfarin 5 mg daily with a goal INR of 2-2.5 for one week and then     2.5-3.5 thereafter.  DISPOSITION AND FOLLOWUP:  [name] has an appointment this Friday, October 22, 2010, at 11 a.m. in the Anticoagulation Clinic with Dr. Alexandria Lodge. Instructions include checking her INR which has a goal of 2- 2.5 for one week following the stroke and then a goal of 2.5-3.5 thereafter.  She also has a followup appointment with her primary care doctor, Dr. Coralee Pesa, on November 16, 2010, at 8:45 a.m. for post hospital followup.  There were no procedures performed during this hospitalization.  CONSULTATIONS:  Neurology.  BRIEF ADMITTING HISTORY AND PHYSICAL:  This is an 77 year old woman with a past medical history of TIAs and strokes with full resolution status post pacemaker, atrial fibrillation, hypertension, hyperlipidemia, and diabetes who presented with acute onset of right hand weakness.  Her daughter brought her to the emergency room saying that she was dropping objects with her right hand, that her speech was more slurred and that she appeared confused.  She was on Coumadin for atrial fibrillation at that time with an INR of 2.27 and she noted that it felt like the same stroke she had prior.  She denied any loss of consciousness, headache, or vision changes and as  mentioned was on warfarin at a therapeutic dose.  The patient had no known allergies.  PAST MEDICAL HISTORY:  As indicated.  PHYSICAL EXAMINATION ON ADMISSION: VITAL SIGNS:  Temperature 99.0, blood pressure 122/43, heart rate 68,  respiratory rate 20, O2 saturation 99% on room air. HEART:  Irregularly irregular tachycardic rhythm with a 2/6 systolic murmur. NEUROLOGIC:  Significant for 4/5 strength in the right upper limb compared to 5/5 strength in the left upper limb.  Imaging was obtained and a noncontrast head CT revealed no acute infarct or intracranial hemorrhage.  MRI was not obtained because of her pacemaker.  Her labs on admission were significant for hemoglobin 8.9, INR 2.27 and  prothrombin time 25.4.  HOSPITAL COURSE BY PROBLEM: 1. Stroke.  [name] has a history of multiple strokes who presented      with stroke like symptoms while on Coumadin at a therapeutic INR of      2.27.  She could not get an MRI secondary to her pacemaker.  Neurology      decided to admit her for continued monitoring.  She had noted      resolution of symptoms and was not a tPA candidate given that she was  on warfarin at a therapeutic INR.  She was monitored and by the      following morning, her symptoms had resolved almost completely to      baseline.  Because she had a stroke while on therapy with Coumadin,      it was decided that her target INR will be raised to 2.5-3.5;      however, for the first week following stroke, the INR will be kept      at 2.0-2.5 to prevent any hemorrhagic transformation.  One week      following discharge, she will be raised to a goal of 2.5-3.5 and will     follow up in the Anticoagulation Clinic and with her primary care     doctor.  2. Atrial fibrillation.  [name] remained rate controlled     throughout her visit and will continue with anticoagulation as     mentioned.  She will continue on metoprolol.  3. Diabetes, type 2.  Hemoglobin A1c  6.2 in May 2012.  Diet     controlled.  No problems during this hospitalization.  4. Hypertension.  Well controlled during the hospitalization.  She     was allowed to have a slightly high blood pressure in the     first 24 hours following the stroke, but then settled back down     into an acceptable blood pressure range.  5. Anemia.  She was noted to be anemic, but her primary care doctor      communicatated that she had received both endoscopy and colonoscopy      in the past year that proved to be negative.  Her anemia panel did      indicate a possible early iron-deficiency anemia.  She was     started on iron therapy.  6. Urinary incontinence.  She was continued on her VESIcare     regimen and this problem was not addressed during this     hospitalization.  Discharge: This patient was discharged to home in stable condition. Home physical  therapy was arranged for continued rehabilitation.  Sheis quite independent and lives at home.  She will be seen and followed up in both Anticoagulation Clinic and with her primary care doctor.  Vitals: Temp 98.4, BP 126/76, P 88, RR 18, O2 sat 99%  Labs: WBC 5.0 Hemoglobin 8.8 Hematocrit 26.4 MCV 86.8 Platelets 184  Sodium 139 Potassium 3.8 Chloride 103 CO2 28 Glucose 144 BUN 12 Creatinine 0.87   ______________________________ Kathreen Cosier, MD   ______________________________ Doneen Poisson, MD   BW/MEDQ  D:  10/19/2010  T:  10/20/2010  Job:  [id number]  Electronically Signed by Kathreen Cosier MD on 10/25/2010 95:62:13 PM Electronically Signed by Doneen Poisson  on 11/06/2010 11:25:23 AM

## 2010-11-08 ENCOUNTER — Ambulatory Visit: Payer: Medicare HMO

## 2010-11-16 ENCOUNTER — Encounter: Payer: Self-pay | Admitting: Internal Medicine

## 2010-11-16 ENCOUNTER — Ambulatory Visit (INDEPENDENT_AMBULATORY_CARE_PROVIDER_SITE_OTHER): Payer: Medicare HMO | Admitting: Pharmacist

## 2010-11-16 ENCOUNTER — Ambulatory Visit (INDEPENDENT_AMBULATORY_CARE_PROVIDER_SITE_OTHER): Payer: Medicare HMO | Admitting: Internal Medicine

## 2010-11-16 VITALS — BP 128/66 | HR 98 | Temp 97.7°F | Wt 124.9 lb

## 2010-11-16 DIAGNOSIS — Z7901 Long term (current) use of anticoagulants: Secondary | ICD-10-CM

## 2010-11-16 DIAGNOSIS — K5909 Other constipation: Secondary | ICD-10-CM

## 2010-11-16 DIAGNOSIS — I6350 Cerebral infarction due to unspecified occlusion or stenosis of unspecified cerebral artery: Secondary | ICD-10-CM

## 2010-11-16 DIAGNOSIS — I639 Cerebral infarction, unspecified: Secondary | ICD-10-CM

## 2010-11-16 DIAGNOSIS — D649 Anemia, unspecified: Secondary | ICD-10-CM

## 2010-11-16 DIAGNOSIS — Z8679 Personal history of other diseases of the circulatory system: Secondary | ICD-10-CM

## 2010-11-16 DIAGNOSIS — F329 Major depressive disorder, single episode, unspecified: Secondary | ICD-10-CM

## 2010-11-16 DIAGNOSIS — I10 Essential (primary) hypertension: Secondary | ICD-10-CM

## 2010-11-16 DIAGNOSIS — D509 Iron deficiency anemia, unspecified: Secondary | ICD-10-CM | POA: Insufficient documentation

## 2010-11-16 DIAGNOSIS — I4891 Unspecified atrial fibrillation: Secondary | ICD-10-CM

## 2010-11-16 DIAGNOSIS — R634 Abnormal weight loss: Secondary | ICD-10-CM

## 2010-11-16 LAB — CBC
Hemoglobin: 9.8 g/dL — ABNORMAL LOW (ref 12.0–15.0)
MCH: 28.8 pg (ref 26.0–34.0)
MCV: 86.5 fL (ref 78.0–100.0)
RBC: 3.4 MIL/uL — ABNORMAL LOW (ref 3.87–5.11)

## 2010-11-16 LAB — PROTIME-INR: INR: 8.86 (ref <1.50)

## 2010-11-16 NOTE — Assessment & Plan Note (Signed)
Patient had had a lot of loss in her family when I last saw her, but she has no complaints of depression today. Her daughter comfirms that she is doing better.

## 2010-11-16 NOTE — Patient Instructions (Signed)
I will see you back in 2 months. I am glad you are doing better. Have a great time at the beach!

## 2010-11-16 NOTE — Assessment & Plan Note (Signed)
Weight has now stabilized, thankfully. Will follow.

## 2010-11-16 NOTE — Progress Notes (Signed)
  Subjective:    Patient ID: [name], female    DOB: Aug 18, 1925, 77 y.o.   MRN: [id number]  HPI 77 year old who comes for follow up of depression, weight loss and recent hospitalization for CVA. [name] was hospitalized 6/30-10/18/10 for onset of R hand weakness, slurred speech, and confusion. A head CT was negative and an MRI was not done because of her pacemaker. Her symptoms all resolved, as they had with CVA in the past. She was therapeutic with INR 2.27 at the time of admission.  Her daughter never started her on the mirtazepine as her appetite and sleep improved on their own. Her weight is up 1 1/2 pounds on its own. She is no longer feeling depressed.   Review of Systems  Constitutional: Positive for appetite change. Negative for activity change.  Respiratory: Negative for shortness of breath.   Cardiovascular: Negative for chest pain.  Gastrointestinal: Negative for nausea, vomiting, diarrhea and blood in stool.  Genitourinary: Negative for hematuria.  Neurological: Negative for dizziness, speech difficulty, weakness and numbness.  Hematological: Does not bruise/bleed easily.  Psychiatric/Behavioral: Negative for dysphoric mood.       Objective:   Physical Exam  Neck: Normal range of motion.  Cardiovascular: Normal rate.   Pulmonary/Chest: Breath sounds normal.  Musculoskeletal: She exhibits no edema.  irreg. Irreg. rhythm        Assessment & Plan:

## 2010-11-16 NOTE — Assessment & Plan Note (Signed)
Mirilax is working well, but she cannot toloerate iron.

## 2010-11-16 NOTE — Patient Instructions (Signed)
Patient instructed to take medications as defined in the Anti-coagulation Track section of this encounter.  Patient instructed to OMT today's dose (though already taken before arrival to [facility]). Will OMIT tomorrow's dose and Thursday's doses.  Patient/daughter verbalized understanding of these instructions.

## 2010-11-16 NOTE — Progress Notes (Signed)
Anti-Coagulation Progress Note  [name] is a 77 y.o. female who is currently on an anti-coagulation regimen.    RECENT RESULTS: Recent results are below, the most recent result is correlated with a dose of 45 mg. per week: Lab Results  Component Value Date   INR >8.0 11/16/2010   INR 8.86* 11/16/2010   INR 2.0 10/25/2010    ANTI-COAG DOSE:   Latest dosing instructions   Total Glynis Smiles Tue Wed Thu Fri Sat   0                          ANTICOAG SUMMARY: Anticoagulation Episode Summary              Current INR goal 2.0-3.0 Next INR check 11/19/2010   INR from last check >8.0 (11/16/2010)     The INR is not numeric and cannot be flagged as normal/abnormal.     Weekly max dose (mg)  Target end date Indefinite   Indications Long term current use of anticoagulant, Atrial fibrillation   [facility]    Send INR reminders to ANTICOAG IMP   Comments        Provider Role Specialty Phone number   Levada Schilling [facility]  Internal Medicine [phone]        ANTICOAG TODAY: Anticoagulation Summary as of 11/16/2010              INR goal 2.0-3.0     Selected INR >8.0 (11/16/2010)     The INR is not numeric and cannot be flagged as normal/abnormal. Next INR check 11/19/2010   Weekly max dose (mg)  Target end date Indefinite   Indications Long term current use of anticoagulant, Atrial fibrillation    Anticoagulation Episode Summary              IN[facility] check [facility]    Send INR reminders to ANTICOAG IMP   Comments        Provider Role Specialty Phone number   Levada Schilling [facility]  Internal Medicine [phone]        PATIENT INSTRUCTIONS: Patient Instructions  Patient instructed to take medications as defined in the Anti-coagulation Track section of this encounter.  Patient instructed to OMT today's dose (though already taken before arrival to [facility]). Will OMIT tomorrow's dose and Thursday's doses.  Patient/daughter  verbalized understanding of these instructions.       FOLLOW-UP Return in 3 days (on 11/19/2010) for Follow up INR.  [name], [name], CACP

## 2010-11-16 NOTE — Assessment & Plan Note (Signed)
See note under chronic anticoagulation. Will plan to switch to pradaxa.

## 2010-11-16 NOTE — Assessment & Plan Note (Addendum)
The INR today is 8.86. This is the second time in 14 months that [name]' INR has been markedly elevated without a good explanation. The last time was 6/11. It was 8.93 and she was admitted to the hospital. As well she has just been admitted to the hospital for a CVA despite being on therapeutic on coumadin. Therefore I think she should be changed to Pradaxa or Riveroxiban. Dr. Alexandria Lodge and I have discussed this and are in agreement. He has stopped her coumadin and is seeing her back in 3 days.

## 2010-11-16 NOTE — Assessment & Plan Note (Signed)
All of patient's symptoms have resolved, except possibly subtle cognitive changes. Will start her on rivaroxaban and ASA 81mg  once her INR is down to <3.0.

## 2010-11-16 NOTE — Assessment & Plan Note (Signed)
Well-controlled.  Continue present medications. 

## 2010-11-16 NOTE — Assessment & Plan Note (Signed)
We again dicussed ways to get iron in the diet.

## 2010-11-19 ENCOUNTER — Ambulatory Visit (INDEPENDENT_AMBULATORY_CARE_PROVIDER_SITE_OTHER): Payer: Medicare HMO | Admitting: Pharmacist

## 2010-11-19 DIAGNOSIS — Z8679 Personal history of other diseases of the circulatory system: Secondary | ICD-10-CM

## 2010-11-19 DIAGNOSIS — I4891 Unspecified atrial fibrillation: Secondary | ICD-10-CM

## 2010-11-19 DIAGNOSIS — Z7901 Long term (current) use of anticoagulants: Secondary | ICD-10-CM

## 2010-11-19 LAB — POCT INR: INR: 2.2

## 2010-11-19 NOTE — Patient Instructions (Signed)
Patient instructed to take medications as defined in the Anti-coagulation Track section of this encounter.  Patient instructed to take today's dose.  Patient verbalized understanding of these instructions.    

## 2010-11-19 NOTE — Progress Notes (Signed)
Anti-Coagulation Progress Note  [name] is a 77 y.o. female who is currently on an anti-coagulation regimen.    RECENT RESULTS: Recent results are below, the most recent result is correlated with a dose of having OMITTED 2 days therapy. Lab Results  Component Value Date   INR 2.2 11/19/2010   INR >8.0 11/16/2010   INR 8.86* 11/16/2010    ANTI-COAG DOSE:   Latest dosing instructions   Total Sun Mon Tue Wed Thu Fri Sat   42.5 5 mg 7.5 mg 5 mg 7.5 mg 5 mg 7.5 mg 5 mg    (5 mg1) (5 mg1.5) (5 mg1) (5 mg1.5) (5 mg1) (5 mg1.5) (5 mg1)         ANTICOAG SUMMARY: Anticoagulation Episode Summary              Current INR goal 2.0-3.0 Next INR check 12/06/2010   INR from last check 2.2 (11/19/2010)     Weekly max dose (mg)  Target end date Indefinite   Indications Long term current use of anticoagulant, Atrial fibrillation   [facility] check [facility]    Send INR reminders to ANTICOAG IMP   Comments        Provider Role Specialty Phone number   Levada Schilling [facility]  Internal Medicine [phone]        ANTICOAG TODAY: Anticoagulation Summary as of 11/19/2010              INR goal 2.0-3.0     Selected INR 2.2 (11/19/2010) Next INR check 12/06/2010   Weekly max dose (mg)  Target end date Indefinite   Indications Long term current use of anticoagulant, Atrial fibrillation    Anticoagulation Episode Summary              INR check location [facility] Preferred [facility]    Send INR reminders to ANTICOAG IMP   Comments        Provider Role Specialty Phone number   Levada Schilling [facility]  Internal Medicine [phone]        PATIENT INSTRUCTIONS: Patient Instructions  Patient instructed to take medications as defined in the Anti-coagulation Track section of this encounter.  Patient instructed to take today's dose.  Patient verbalized understanding of these instructions.        FOLLOW-UP Return in 2 weeks (on 12/06/2010) for Follow up INR.  [name], [name], CACP

## 2010-12-06 ENCOUNTER — Ambulatory Visit (INDEPENDENT_AMBULATORY_CARE_PROVIDER_SITE_OTHER): Payer: Medicare HMO | Admitting: Pharmacist

## 2010-12-06 DIAGNOSIS — Z7901 Long term (current) use of anticoagulants: Secondary | ICD-10-CM

## 2010-12-06 DIAGNOSIS — I4891 Unspecified atrial fibrillation: Secondary | ICD-10-CM

## 2010-12-06 DIAGNOSIS — Z8679 Personal history of other diseases of the circulatory system: Secondary | ICD-10-CM

## 2010-12-06 LAB — POCT INR: INR: 2.2

## 2010-12-06 NOTE — Patient Instructions (Signed)
Patient instructed to take medications as defined in the Anti-coagulation Track section of this encounter.  Patient instructed to take today's dose.  Patient verbalized understanding of these instructions.    

## 2010-12-06 NOTE — Progress Notes (Signed)
Anti-Coagulation Progress Note  [name] is a 77 y.o. female who is currently on an anti-coagulation regimen.    RECENT RESULTS: Recent results are below, the most recent result is correlated with a dose of 42.5 mg. per week: Lab Results  Component Value Date   INR 2.20 12/06/2010   INR 2.2 11/19/2010   INR >8.0 11/16/2010    ANTI-COAG DOSE:   Latest dosing instructions   Total Sun Mon Tue Wed Thu Fri Sat   42.5 5 mg 7.5 mg 5 mg 7.5 mg 5 mg 7.5 mg 5 mg    (5 mg1) (5 mg1.5) (5 mg1) (5 mg1.5) (5 mg1) (5 mg1.5) (5 mg1)         ANTICOAG SUMMARY: Anticoagulation Episode Summary              Current INR goal 2.0-3.0 Next INR check 01/03/2011   INR from last check 2.20 (12/06/2010)     Weekly max dose (mg)  Target end date Indefinite   Indications Long term current use of anticoagulant, Atrial fibrillation   [facility]R check location [facility]    Send INR reminders to ANTICOAG IMP   Comments        Provider Role Specialty Phone number   Levada Schilling [facility]  Internal Medicine [phone]        ANTICOAG TODAY: Anticoagulation Summary as of 12/06/2010              INR goal 2.0-3.0     Selected INR 2.20 (12/06/2010) Next INR check 01/03/2011   Weekly max dose (mg)  Target end date Indefinite   Indications Long term current use of anticoagulant, Atrial fibrillation    Anticoagulation Episode Summary              INR check location [facility]    Send INR reminders to ANTICOAG IMP   Comments        Provider Role Specialty Phone number   Levada Schilling [facility]  Internal Medicine [phone]        PATIENT INSTRUCTIONS: Patient Instructions  Patient instructed to take medications as defined in the Anti-coagulation Track section of this encounter.  Patient instructed to take today's dose.  Patient verbalized understanding of these instructions.        FOLLOW-UP Return in 4 weeks (on 01/03/2011) for Follow up INR.  [name],  [name], CACP

## 2010-12-09 ENCOUNTER — Ambulatory Visit: Payer: Medicare HMO

## 2011-01-03 ENCOUNTER — Ambulatory Visit (INDEPENDENT_AMBULATORY_CARE_PROVIDER_SITE_OTHER): Payer: Medicare HMO | Admitting: Pharmacist

## 2011-01-03 DIAGNOSIS — Z7901 Long term (current) use of anticoagulants: Secondary | ICD-10-CM

## 2011-01-03 DIAGNOSIS — I4891 Unspecified atrial fibrillation: Secondary | ICD-10-CM

## 2011-01-03 DIAGNOSIS — Z8679 Personal history of other diseases of the circulatory system: Secondary | ICD-10-CM

## 2011-01-03 LAB — POCT INR: INR: 5.1

## 2011-01-03 NOTE — Patient Instructions (Signed)
Patient instructed to take medications as defined in the Anti-coagulation Track section of this encounter.  Patient instructed to OMIT today's dose.  Patient verbalized understanding of these instructions.    

## 2011-01-03 NOTE — Progress Notes (Signed)
Anti-Coagulation Progress Note  [name] is a 77 y.o. female who is currently on an anti-coagulation regimen.    RECENT RESULTS: Recent results are below, the most recent result is correlated with a dose of 42.5 mg. per week: Lab Results  Component Value Date   INR 5.1 01/03/2011   INR 2.20 12/06/2010   INR 2.2 11/19/2010    ANTI-COAG DOSE:   Latest dosing instructions   Total Sun Mon Tue Wed Thu Fri Sat   40 5 mg 7.5 mg 5 mg 5 mg 5 mg 7.5 mg 5 mg    (5 mg1) (5 mg1.5) (5 mg1) (5 mg1) (5 mg1) (5 mg1.5) (5 mg1)         ANTICOAG SUMMARY: Anticoagulation Episode Summary              Current INR goal 2.0-3.0 Next INR check 01/10/2011   INR from last check 5.1! (01/03/2011)     Weekly max dose (mg)  Target end date Indefinite   Indications Long term current use of anticoagulant, Atrial fibrillation   INR check location [facility]    Send INR reminders to ANTICOAG IMP   Comments        Provider Role Specialty Phone number   Levada Schilling [facility]  Internal Medicine [phone]        ANTICOAG TODAY: Anticoagulation Summary as of 01/03/2011              INR goal 2.0-3.0     Selected INR 5.1! (01/03/2011) Next INR check 01/10/2011   Weekly max dose (mg)  Target end date Indefinite   Indications Long term current use of anticoagulant, Atrial fibrillation    Anticoagulation Episode Summary              INR check location [facility] Preferred [facility]    Send INR reminders to ANTICOAG IMP   Comments        Provider Role Specialty Phone number   Levada Schilling [facility]  Internal Medicine [phone]        PATIENT INSTRUCTIONS: Patient Instructions  Patient instructed to take medications as defined in the Anti-coagulation Track section of this encounter.  Patient instructed to OMIT today's dose.  Patient verbalized understanding of these instructions.        FOLLOW-UP Return in 7 days (on 01/10/2011) for Follow up INR.  [name],  [name], CACP

## 2011-01-10 ENCOUNTER — Ambulatory Visit: Payer: Medicare HMO

## 2011-01-11 ENCOUNTER — Encounter: Payer: Self-pay | Admitting: Internal Medicine

## 2011-01-11 ENCOUNTER — Ambulatory Visit: Payer: Medicare HMO | Admitting: Pharmacist

## 2011-01-11 ENCOUNTER — Ambulatory Visit (INDEPENDENT_AMBULATORY_CARE_PROVIDER_SITE_OTHER): Payer: Medicare HMO | Admitting: Internal Medicine

## 2011-01-11 VITALS — BP 138/71 | HR 87 | Temp 97.7°F | Wt 124.4 lb

## 2011-01-11 DIAGNOSIS — R634 Abnormal weight loss: Secondary | ICD-10-CM

## 2011-01-11 DIAGNOSIS — I10 Essential (primary) hypertension: Secondary | ICD-10-CM

## 2011-01-11 DIAGNOSIS — F32A Depression, unspecified: Secondary | ICD-10-CM

## 2011-01-11 DIAGNOSIS — M542 Cervicalgia: Secondary | ICD-10-CM

## 2011-01-11 DIAGNOSIS — F329 Major depressive disorder, single episode, unspecified: Secondary | ICD-10-CM

## 2011-01-11 DIAGNOSIS — E119 Type 2 diabetes mellitus without complications: Secondary | ICD-10-CM

## 2011-01-11 DIAGNOSIS — D509 Iron deficiency anemia, unspecified: Secondary | ICD-10-CM

## 2011-01-11 DIAGNOSIS — I4891 Unspecified atrial fibrillation: Secondary | ICD-10-CM

## 2011-01-11 LAB — BASIC METABOLIC PANEL
Calcium: 9.6 mg/dL (ref 8.4–10.5)
Creat: 1.15 mg/dL — ABNORMAL HIGH (ref 0.50–1.10)

## 2011-01-11 LAB — POCT GLYCOSYLATED HEMOGLOBIN (HGB A1C): Hemoglobin A1C: 6.5

## 2011-01-11 LAB — POCT INR
INR: 2.5
INR: 2.5

## 2011-01-11 LAB — CBC
Hemoglobin: 10.3 g/dL — ABNORMAL LOW (ref 12.0–15.0)
MCHC: 32.5 g/dL (ref 30.0–36.0)
RBC: 3.69 MIL/uL — ABNORMAL LOW (ref 3.87–5.11)

## 2011-01-11 LAB — GLUCOSE, CAPILLARY: Glucose-Capillary: 183 mg/dL — ABNORMAL HIGH (ref 70–99)

## 2011-01-11 MED ORDER — DICLOFENAC SODIUM 1 % TD GEL: 1.0000 "application " | Freq: Four times a day (QID) | TRANSDERMAL | Status: DC

## 2011-01-11 NOTE — Patient Instructions (Signed)
I will see you back in 3 months. I have prescribed voltaren gel for you to apply to your neck 4-5 times a day as needed for the pain. We will let you know what your blood work shows when it returns. I am glad that your weight has been stable.

## 2011-01-11 NOTE — Patient Instructions (Signed)
Patient instructed to take medications as defined in the Anti-coagulation Track section of this encounter.  Patient instructed to  today's dose.  Patient verbalized understanding of these instructions.     

## 2011-01-11 NOTE — Assessment & Plan Note (Addendum)
Patient does have this diagnosis but refuses to take po iron because of GI side effects. She has a negative EGD and colonoscopy in 2011. She may be a candidate for IV iron at some point.

## 2011-01-11 NOTE — Progress Notes (Signed)
Anti-Coagulation Progress Note  [name] is a 77 y.o. female who is currently on an anti-coagulation regimen.    RECENT RESULTS: Recent results are below, the most recent result is correlated with a dose of 40 mg. per week: Lab Results  Component Value Date   INR 2.5 01/11/2011   INR 2.5 01/11/2011   INR 5.1 01/03/2011    ANTI-COAG DOSE:   Latest dosing instructions   Total Sun Mon Tue Wed Thu Fri Sat   40 5 mg 7.5 mg 5 mg 5 mg 5 mg 7.5 mg 5 mg    (5 mg1) (5 mg1.5) (5 mg1) (5 mg1) (5 mg1) (5 mg1.5) (5 mg1)         ANTICOAG SUMMARY: Anticoagulation Episode Summary              Current INR goal 2.0-3.0 Next INR check 02/07/2011   INR from last check 2.5 (01/11/2011)     Weekly max dose (mg)  Target end date Indefinite   Indications Long term current use of anticoagulant, Atrial fibrillation   INR check location [facility] Pref[facility]    Send INR reminders to ANTICOAG IMP   Comments        Provider Role Specialty Phone number   Levada Schilling [facility]  Internal Medicine [phone]        ANTICOAG TODAY: Anticoagulation Summary as of 01/11/2011              INR goal 2.0-3.0     Selected INR 2.5 (01/11/2011) Next INR check 02/07/2011   Weekly max dose (mg)  Target end date Indefinite   Indications Long term current use of anticoagulant, Atrial fibrillation    Anticoagulation Episode Summary              INR check location [facility] Preferred [facility]    Send INR reminders to ANTICOAG IMP   Comments        Provider Role Specialty Phone number   Levada Schilling [facility]  Internal Medicine [phone]        PATIENT INSTRUCTIONS: Patient Instructions  Patient instructed to take medications as defined in the Anti-coagulation Track section of this encounter.  Patient instructed to  today's dose.  Patient verbalized understanding of these instructions.        FOLLOW-UP No Follow-up on file.  [name], [name], CACP

## 2011-01-11 NOTE — Assessment & Plan Note (Signed)
Continued excellent control.

## 2011-01-11 NOTE — Assessment & Plan Note (Signed)
Diet controlled.  

## 2011-01-11 NOTE — Assessment & Plan Note (Signed)
She endorses nothing to suggest depression and is NOT depressed.

## 2011-01-11 NOTE — Assessment & Plan Note (Signed)
With patient now having had 2 admissions for dangerously high INR's (2010, 2012) and another elevated INR last week I think she is a good candidate to consider one of the new agents. I have spoken to patient and her daughter about this at length today. Her daughter has taken the names of the 2 used in practice most now-Pradaxa and Xarelto-and she will go read about them and discuss it with her mother and her siblings. Her INR was drawn today and Dr. Alexandria Lodge kindly came down and took care of that.

## 2011-01-11 NOTE — Assessment & Plan Note (Signed)
Patient's weight has stabilized and is up 6 pounds from its low. Her appetite is good.

## 2011-01-11 NOTE — Progress Notes (Signed)
  Subjective:    Patient ID: [name], female    DOB: 1925-11-13, 77 y.o.   MRN: [id number]  HPI 77 yr old who comes in for follow up. She is still on coumadin at she and her families desire, despite 2 hospitalizations for INR in 9-10 range. As well just last week INR was again 5.01. Again without an obvious cause.   She complains of neck pain when she goes to sleep at night and when she gets up in the morning. It is on either side and "feels stiff." No symptoms down her arms and no hx of trauma.  She also c/o feeling like "she has a fever" in her midepigastric area. This has gone on since her colonoscopy over a year ago. There is no change with eating and she denies heartburn or other associated symptoms.  Her weight has been stable. She denies any signs or symptoms of depression.   Review of Systems  Constitutional: Negative for fever, activity change, fatigue and unexpected weight change.  Respiratory: Negative for chest tightness and shortness of breath.   Cardiovascular: Negative for chest pain.  Gastrointestinal: Negative for nausea, vomiting, diarrhea and constipation.  as per HPI.     Objective:   Physical Exam  Constitutional: She appears well-developed and well-nourished.  HENT:  Head: Normocephalic and atraumatic.  Neck: Normal range of motion. No JVD present.  Cardiovascular: Normal rate.   Pulmonary/Chest: Breath sounds normal.  Abdominal: Bowel sounds are normal. She exhibits no distension. There is no tenderness.  Musculoskeletal: She exhibits no edema.  Neck-no pain along C-spine, there is some discomfort along paraspinal muscles bilaterally-mild. Heart rhythm is irr. irr.        Assessment & Plan:

## 2011-01-11 NOTE — Assessment & Plan Note (Signed)
I think this is mild muscle strain and maybe a little bit of spasm. Patient preferred to try topical voltaren gel. This has been prescribed to use up to qid. If this is not helpful or adequate, can consider flexeril at night.

## 2011-01-18 LAB — COMPREHENSIVE METABOLIC PANEL
ALT: <8
Albumin: 3.4 — ABNORMAL LOW
Albumin: 3.5
Alkaline Phosphatase: 103
Alkaline Phosphatase: 115
BUN: 9
Calcium: 9
Calcium: 9.3
Creatinine, Ser: 0.81
GFR calc Af Amer: >60
Glucose, Bld: 100 — ABNORMAL HIGH
Glucose, Bld: 96
Potassium: 3.6
Sodium: 136
Total Protein: 6.9
Total Protein: 7.3

## 2011-01-18 LAB — GLUCOSE, CAPILLARY
Glucose-Capillary: 112 — ABNORMAL HIGH
Glucose-Capillary: 126 — ABNORMAL HIGH
Glucose-Capillary: 134 — ABNORMAL HIGH
Glucose-Capillary: 136 — ABNORMAL HIGH
Glucose-Capillary: 138 — ABNORMAL HIGH
Glucose-Capillary: 144 — ABNORMAL HIGH
Glucose-Capillary: 150 — ABNORMAL HIGH
Glucose-Capillary: 153 — ABNORMAL HIGH
Glucose-Capillary: 210 — ABNORMAL HIGH
Glucose-Capillary: 84

## 2011-01-18 LAB — CBC
Hemoglobin: 11.3 — ABNORMAL LOW
MCHC: 32.9
MCHC: 33.1
MCV: 94.8
MCV: 95.3
Platelets: 215
RBC: 3.52 — ABNORMAL LOW
RBC: 3.61 — ABNORMAL LOW
RBC: 3.72 — ABNORMAL LOW
RDW: 14.7
WBC: 6.9
WBC: 7

## 2011-01-18 LAB — LIPID PANEL
Triglycerides: 87
VLDL: 17

## 2011-01-18 LAB — PROTIME-INR
INR: 1.3
INR: 1.4
INR: 2.3 — ABNORMAL HIGH
Prothrombin Time: 16.5 — ABNORMAL HIGH
Prothrombin Time: 19.4 — ABNORMAL HIGH
Prothrombin Time: 19.6 — ABNORMAL HIGH
Prothrombin Time: 21.1 — ABNORMAL HIGH
Prothrombin Time: 26.5 — ABNORMAL HIGH

## 2011-01-18 LAB — URINE CULTURE: Colony Count: >=100000

## 2011-01-18 LAB — URINALYSIS, ROUTINE W REFLEX MICROSCOPIC
Glucose, UA: NEGATIVE
Protein, ur: 30 — AB
Specific Gravity, Urine: 1.012
Urobilinogen, UA: 0.2

## 2011-01-18 LAB — CK TOTAL AND CKMB (NOT AT ARMC)
CK, MB: 0.6
Relative Index: INVALID
Total CK: 29

## 2011-01-18 LAB — HEMOGLOBIN A1C
Hgb A1c MFr Bld: 6.9 — ABNORMAL HIGH
Mean Plasma Glucose: 151

## 2011-01-18 LAB — BASIC METABOLIC PANEL
BUN: 13
CO2: 25
CO2: 29
Chloride: 104
Chloride: 97
Creatinine, Ser: 0.94
Creatinine, Ser: 1.3 — ABNORMAL HIGH
GFR calc Af Amer: >60
Glucose, Bld: 116 — ABNORMAL HIGH

## 2011-01-18 LAB — DIFFERENTIAL
Basophils Relative: 0
Basophils Relative: 1
Eosinophils Absolute: 0.1
Eosinophils Absolute: 0.1
Lymphocytes Relative: 28
Lymphs Abs: 1.9
Lymphs Abs: 2.1
Monocytes Absolute: 0.6
Monocytes Relative: 8
Monocytes Relative: 9
Neutro Abs: 4.3
Neutrophils Relative %: 41 — ABNORMAL LOW
Neutrophils Relative %: 59
Neutrophils Relative %: 62

## 2011-01-18 LAB — B-NATRIURETIC PEPTIDE (CONVERTED LAB): Pro B Natriuretic peptide (BNP): 324 — ABNORMAL HIGH

## 2011-01-18 LAB — TSH: TSH: 1.48

## 2011-01-18 LAB — URINE MICROSCOPIC-ADD ON

## 2011-01-18 LAB — APTT: aPTT: 32

## 2011-01-18 LAB — SEDIMENTATION RATE: Sed Rate: 39 — ABNORMAL HIGH

## 2011-01-18 LAB — POCT CARDIAC MARKERS
CKMB, poc: 1.1
Myoglobin, poc: 55.5
Troponin i, poc: <0.05

## 2011-01-19 LAB — GLUCOSE, CAPILLARY: Glucose-Capillary: 124 — ABNORMAL HIGH

## 2011-01-28 LAB — BASIC METABOLIC PANEL
BUN: 10
CO2: 28
Calcium: 9.3
Chloride: 106
Creatinine, Ser: 0.89
GFR calc Af Amer: >60

## 2011-01-28 LAB — URINALYSIS, ROUTINE W REFLEX MICROSCOPIC
Glucose, UA: NEGATIVE
Hgb urine dipstick: NEGATIVE
Protein, ur: NEGATIVE
Specific Gravity, Urine: 1.005

## 2011-01-28 LAB — CARDIAC PANEL(CRET KIN+CKTOT+MB+TROPI)
Relative Index: INVALID
Relative Index: INVALID
Total CK: 56
Troponin I: 0.01

## 2011-01-28 LAB — BLOOD GAS, ARTERIAL
Acid-base deficit: 1.3
O2 Saturation: 94.1
Patient temperature: 98.6
TCO2: 23.8
pCO2 arterial: 36.9

## 2011-01-28 LAB — CBC
HCT: 34.8 — ABNORMAL LOW
HCT: 35.8 — ABNORMAL LOW
MCHC: 32.8
MCHC: 33.3
MCV: 94.4
MCV: 96.1
Platelets: 153
RBC: 3.73 — ABNORMAL LOW
RDW: 14.7 — ABNORMAL HIGH
WBC: 5

## 2011-01-28 LAB — HEMOGLOBIN A1C: Mean Plasma Glucose: 175

## 2011-01-28 LAB — COMPREHENSIVE METABOLIC PANEL
Albumin: 3.5
BUN: 11
Chloride: 104
Creatinine, Ser: 0.96
GFR calc non Af Amer: 56 — ABNORMAL LOW
Glucose, Bld: 119 — ABNORMAL HIGH
Total Bilirubin: 1.8 — ABNORMAL HIGH

## 2011-01-28 LAB — LIPID PANEL
HDL: 54
LDL Cholesterol: 66
Total CHOL/HDL Ratio: 2.6
Triglycerides: 105
VLDL: 21

## 2011-01-28 LAB — PROTIME-INR
Prothrombin Time: 18.7 — ABNORMAL HIGH
Prothrombin Time: 21 — ABNORMAL HIGH

## 2011-01-28 LAB — URINE MICROSCOPIC-ADD ON

## 2011-02-07 ENCOUNTER — Ambulatory Visit: Payer: Medicare HMO

## 2011-02-28 ENCOUNTER — Ambulatory Visit (INDEPENDENT_AMBULATORY_CARE_PROVIDER_SITE_OTHER): Payer: Medicare HMO | Admitting: Pharmacist

## 2011-02-28 DIAGNOSIS — I4891 Unspecified atrial fibrillation: Secondary | ICD-10-CM

## 2011-02-28 DIAGNOSIS — Z7901 Long term (current) use of anticoagulants: Secondary | ICD-10-CM

## 2011-02-28 DIAGNOSIS — Z8679 Personal history of other diseases of the circulatory system: Secondary | ICD-10-CM

## 2011-02-28 MED ORDER — WARFARIN SODIUM 5 MG PO TABS: ORAL_TABLET | ORAL | Status: DC

## 2011-02-28 NOTE — Patient Instructions (Signed)
Patient instructed to take medications as defined in the Anti-coagulation Track section of this encounter.  Patient instructed to take today's dose.  Patient verbalized understanding of these instructions.    

## 2011-02-28 NOTE — Progress Notes (Signed)
Anti-Coagulation Progress Note  [name] is a 77 y.o. female who is currently on an anti-coagulation regimen.    RECENT RESULTS: Recent results are below, the most recent result is correlated with a dose of 40 mg. per week: Lab Results  Component Value Date   INR 2.10 02/28/2011   INR 2.5 01/11/2011   INR 2.5 01/11/2011    ANTI-COAG DOSE:   Latest dosing instructions   Total Sun Mon Tue Wed Thu Fri Sat   42.5 5 mg 7.5 mg 5 mg 7.5 mg 5 mg 7.5 mg 5 mg    (5 mg1) (5 mg1.5) (5 mg1) (5 mg1.5) (5 mg1) (5 mg1.5) (5 mg1)         ANTICOAG SUMMARY: Anticoagulation Episode Summary              Current INR goal 2.0-3.0 Next INR check 03/28/2011   INR from last check 2.10 (02/28/2011)     Weekly max dose (mg)  Target end date Indefinite   Indications Long term current use of anticoagulant, Atrial fibrillation   IN[facility] check [facility]    Send INR reminders to ANTICOAG IMP   Comments        Provider Role Specialty Phone number   Levada Schilling [facility]  Internal Medicine [phone]        ANTICOAG TODAY: Anticoagulation Summary as of 02/28/2011              INR goal 2.0-3.0     Selected INR 2.10 (02/28/2011) Next INR check 03/28/2011   Weekly max dose (mg)  Target end date Indefinite   Indications Long term current use of anticoagulant, Atrial fibrillation    Anticoagulation Episode Summary              INR check location [facility]    Send INR reminders to ANTICOAG IMP   Comments        Provider Role Specialty Phone number   Levada Schilling [facility]  Internal Medicine [phone]        PATIENT INSTRUCTIONS: Patient Instructions  Patient instructed to take medications as defined in the Anti-coagulation Track section of this encounter.  Patient instructed to take today's dose.  Patient verbalized understanding of these instructions.        FOLLOW-UP Return in 4 weeks (on 03/28/2011) for Follow up INR.  [name],  [name], CACP

## 2011-03-24 ENCOUNTER — Ambulatory Visit (INDEPENDENT_AMBULATORY_CARE_PROVIDER_SITE_OTHER): Payer: Medicare HMO | Admitting: Internal Medicine

## 2011-03-24 ENCOUNTER — Encounter: Payer: Self-pay | Admitting: Internal Medicine

## 2011-03-24 VITALS — BP 110/68 | HR 71 | Temp 98.6°F | Wt 127.3 lb

## 2011-03-24 DIAGNOSIS — I129 Hypertensive chronic kidney disease with stage 1 through stage 4 chronic kidney disease, or unspecified chronic kidney disease: Secondary | ICD-10-CM

## 2011-03-24 DIAGNOSIS — I4891 Unspecified atrial fibrillation: Secondary | ICD-10-CM

## 2011-03-24 DIAGNOSIS — E119 Type 2 diabetes mellitus without complications: Secondary | ICD-10-CM

## 2011-03-24 DIAGNOSIS — M542 Cervicalgia: Secondary | ICD-10-CM

## 2011-03-24 DIAGNOSIS — R634 Abnormal weight loss: Secondary | ICD-10-CM

## 2011-03-24 DIAGNOSIS — I10 Essential (primary) hypertension: Secondary | ICD-10-CM

## 2011-03-24 DIAGNOSIS — N189 Chronic kidney disease, unspecified: Secondary | ICD-10-CM

## 2011-03-24 LAB — BASIC METABOLIC PANEL
BUN: 17 mg/dL (ref 6–23)
CO2: 25 mEq/L (ref 19–32)
Chloride: 105 mEq/L (ref 96–112)
Glucose, Bld: 121 mg/dL — ABNORMAL HIGH (ref 70–99)
Potassium: 4.4 mEq/L (ref 3.5–5.3)
Sodium: 138 mEq/L (ref 135–145)

## 2011-03-24 MED ORDER — CYCLOBENZAPRINE HCL 5 MG PO TABS: 5.0000 mg | ORAL_TABLET | Freq: Every evening | ORAL | Status: DC | PRN

## 2011-03-24 NOTE — Assessment & Plan Note (Signed)
Excellent blood pressure control, will continue present regimen.

## 2011-03-24 NOTE — Assessment & Plan Note (Signed)
Patient's weight is

## 2011-03-24 NOTE — Assessment & Plan Note (Signed)
Patient's HbA1c has always been 7.0 or lower on diet control alone for at least the last 4 years.Continue current course.

## 2011-03-24 NOTE — Patient Instructions (Signed)
Please schedule a follow up in 3 months.  We are drawing blood and will let you know if it is abnormal.

## 2011-03-24 NOTE — Assessment & Plan Note (Signed)
As I have noted in previous notes patient has had 2 episodes of INR's around 9-10 without a good explanation for why this occurred; both requiring admission, and Dr. Alexandria Lodge and I have both suggested a new agent to patient and her daughter (such as pradaxa or xaralto), however there are resistant to changing to a new agent.

## 2011-03-24 NOTE — Progress Notes (Signed)
  Subjective:    Patient ID: [name], female    DOB: 08-17-1925, 77 y.o.   MRN: [id number]  HPI15 year old who comes in for follow up. [name] continues to see Dr. Alexandria Lodge regularly for chronic anti-coagulation therapy for atrial fibrillation.  She continues to complain of neck pain. The voltaren gel has not really helped. She says in can hurt anytime but especially when she gets up and at night. She does feel like the muscles in her neck are tight. Heat does help.  As far as urinary incontinence she is not sure if the vesicare has helped or not. She has not really tried trying to urinate on a regular basis. It is hard for her to define her incontinence.  Patient has had renal insufficiency (Cr of 1.30 in 2009 and 1.21 in 2011), but more recently it has been normal so the value of 1.15 a couple months ago is up from what it has been most recently.  She had had a history of weight loss, but her weight had stabilized and in fact it is up 3 pounds today.   Review of Systems  Constitutional: Negative for fatigue.  Respiratory: Negative for shortness of breath.   Cardiovascular: Negative for chest pain.  Gastrointestinal: Negative for nausea, vomiting and abdominal pain.       Objective:   Physical Exam  Constitutional: She appears well-developed. No distress.  HENT:  Head: Normocephalic and atraumatic.  Cardiovascular: Normal rate, regular rhythm and normal heart sounds.   No murmur heard. Pulmonary/Chest: Breath sounds normal.  Abdominal: Soft. Bowel sounds are normal. There is no tenderness.  Musculoskeletal: Normal range of motion. She exhibits no edema.  Skin: Skin is warm and dry.  Psychiatric: She has a normal mood and affect.  Neck-no pain along C spine, there is some tendness alone the paraspinal muscles and they seem a little tight bilaterally       Assessment & Plan:

## 2011-03-25 NOTE — Assessment & Plan Note (Addendum)
As per HPI, will check Cr today as it was up a little last check. She has had CKD over the last few years in 1.1-1.3 range.

## 2011-03-28 ENCOUNTER — Ambulatory Visit: Payer: Medicare HMO

## 2011-03-31 ENCOUNTER — Encounter (HOSPITAL_COMMUNITY): Payer: Self-pay | Admitting: *Deleted

## 2011-03-31 ENCOUNTER — Emergency Department (HOSPITAL_COMMUNITY): Payer: Medicare HMO

## 2011-03-31 ENCOUNTER — Emergency Department (HOSPITAL_COMMUNITY)
Admission: EM | Admit: 2011-03-31 | Discharge: 2011-03-31 | Disposition: A | Discharge status: Home / Self Care | Payer: Medicare HMO | Attending: Emergency Medicine | Admitting: Emergency Medicine

## 2011-03-31 DIAGNOSIS — Y92009 Unspecified place in unspecified non-institutional (private) residence as the place of occurrence of the external cause: Secondary | ICD-10-CM | POA: Insufficient documentation

## 2011-03-31 DIAGNOSIS — W010XXA Fall on same level from slipping, tripping and stumbling without subsequent striking against object, initial encounter: Secondary | ICD-10-CM | POA: Insufficient documentation

## 2011-03-31 DIAGNOSIS — I10 Essential (primary) hypertension: Secondary | ICD-10-CM | POA: Insufficient documentation

## 2011-03-31 DIAGNOSIS — I4891 Unspecified atrial fibrillation: Secondary | ICD-10-CM | POA: Insufficient documentation

## 2011-03-31 DIAGNOSIS — E119 Type 2 diabetes mellitus without complications: Secondary | ICD-10-CM | POA: Insufficient documentation

## 2011-03-31 DIAGNOSIS — M542 Cervicalgia: Secondary | ICD-10-CM | POA: Insufficient documentation

## 2011-03-31 DIAGNOSIS — Z7901 Long term (current) use of anticoagulants: Secondary | ICD-10-CM | POA: Insufficient documentation

## 2011-03-31 DIAGNOSIS — S20219A Contusion of unspecified front wall of thorax, initial encounter: Secondary | ICD-10-CM | POA: Insufficient documentation

## 2011-03-31 DIAGNOSIS — Z950 Presence of cardiac pacemaker: Secondary | ICD-10-CM | POA: Insufficient documentation

## 2011-03-31 DIAGNOSIS — Z79899 Other long term (current) drug therapy: Secondary | ICD-10-CM | POA: Insufficient documentation

## 2011-03-31 DIAGNOSIS — R079 Chest pain, unspecified: Secondary | ICD-10-CM | POA: Insufficient documentation

## 2011-03-31 DIAGNOSIS — M129 Arthropathy, unspecified: Secondary | ICD-10-CM | POA: Insufficient documentation

## 2011-03-31 DIAGNOSIS — I509 Heart failure, unspecified: Secondary | ICD-10-CM | POA: Insufficient documentation

## 2011-03-31 DIAGNOSIS — M810 Age-related osteoporosis without current pathological fracture: Secondary | ICD-10-CM | POA: Insufficient documentation

## 2011-03-31 DIAGNOSIS — Z8673 Personal history of transient ischemic attack (TIA), and cerebral infarction without residual deficits: Secondary | ICD-10-CM | POA: Insufficient documentation

## 2011-03-31 DIAGNOSIS — W19XXXA Unspecified fall, initial encounter: Secondary | ICD-10-CM

## 2011-03-31 NOTE — ED Notes (Signed)
Pt reports falling yesterday when she was getting out of the chair. She says she fell on her left side onto the floor and c/o pain in left rib area.

## 2011-03-31 NOTE — Progress Notes (Signed)
Message left on recorder with results.Holly Spittle Cass[place]12/13/20121:29 PM

## 2011-03-31 NOTE — ED Provider Notes (Signed)
History     CSN: [id number] Arrival date & time: 03/31/2011  8:37 AM   First MD Initiated Contact with Patient 03/31/11 [phone]      Chief Complaint  Patient presents with  . Fall    (Consider location/radiation/quality/duration/timing/severity/associated sxs/prior treatment) HPI Comments: Patient was walking to her living room yesterday when her leg gave out and she fell on her left side. Since that time she's been able to walk without difficulty and states today she just feels kind of sore but is having severe pain in the left lower ribs and mild pain in the left side of her neck. She denies any loss of consciousness or head injury but she does take Coumadin.  Patient is a 77 y.o. female presenting with fall. The history is provided by the patient.  Fall The accident occurred yesterday. The fall occurred while walking. She landed on a hard floor (Tiled floor). There was no blood loss. Point of impact: Left side. Pain location: Left rib and left side of neck. The pain is at a severity of 5/10. The pain is moderate. She was ambulatory at the scene. Pertinent negatives include no numbness, no abdominal pain, no headaches, no loss of consciousness and no tingling. Associated symptoms comments: No shortness of breath. The symptoms are aggravated by pressure on the injury. She has tried NSAIDs for the symptoms. The treatment provided mild relief.    Past Medical History  Diagnosis Date  . Weight loss, unintentional 2010-2011    8/08-8/10 138-146 lbs, between 8/10-10/11 lost 18 lbs., work-up negative  . Incontinence overflow, stress female     on vesicare  . History of sick sinus syndrome s/p pacemaker Dr. [name] 2000  . Atrial fibrillation     on coumadin followed by Dr. Alexandria Lodge  . History of vaginal bleeding 05/2005    nagative endometrial biopsy  . Restrictive lung disease 1996    PFT's showed mild disease  . Anemia     Iron deficiency, does not tolerate po  . CHF (congestive heart  failure)     diastolic dysfunction  . Diabetes mellitus     diet controlled  . Hypertension     well controlled on 3 agents  . Osteoporosis   . Stroke 2002    R MCA, cardioembolic  . Allergic rhinitis   . Onychomycosis   . History of trichomonal urethritis   . Arthritis     Past Surgical History  Procedure Date  . Abdominal hysterectomy   . Cholecystectomy   . Pacemaker insertion 2000, Dr. [name]  . Cataract extraction     Family History  Problem Relation Age of Onset  . Diabetes Sister     History  Substance Use Topics  . Smoking status: Former Games developer  . Smokeless tobacco: Not on file  . Alcohol Use: 0.0 oz/week    0 drink(s) per week     occasional use    OB History    Grav Para Term Preterm Abortions TAB SAB Ect Mult Living                  Review of Systems  Respiratory: Negative for cough and shortness of breath.   Gastrointestinal: Negative for abdominal pain.  Musculoskeletal: Negative for back pain.  Neurological: Negative for tingling, loss of consciousness, numbness and headaches.  All other systems reviewed and are negative.    Allergies  Review of patient's allergies indicates no known allergies.  Home Medications   Current Outpatient Rx  Name Route Sig Dispense Refill  . CYCLOBENZAPRINE HCL 5 MG PO TABS Oral Take 5 mg by mouth at bedtime as needed. For spams     . FUROSEMIDE 20 MG PO TABS Oral Take 20 mg by mouth daily.      Marland Kitchen HYPROMELLOSE 2.5 % OP SOLN Both Eyes Place 1 drop into both eyes daily.      Marland Kitchen LISINOPRIL 40 MG PO TABS Oral Take 40 mg by mouth daily.      Marland Kitchen METOPROLOL TARTRATE 100 MG PO TABS Oral Take 100 mg by mouth 2 (two) times daily.      Marland Kitchen OMEPRAZOLE 20 MG PO CPDR Oral Take 20 mg by mouth daily.      Marland Kitchen SOLIFENACIN SUCCINATE 10 MG PO TABS Oral Take 10 mg by mouth daily.      . WARFARIN SODIUM 5 MG PO TABS Oral Take 5-7.5 mg by mouth daily. Take 7.5mg  on Mon and Fri.  Take 5mg  all other days.       BP 155/78  Pulse 74   Temp(Src) 98.1 F (36.7 C) (Oral)  SpO2 98%  Physical Exam  Nursing note and vitals reviewed. Constitutional: She is oriented to person, place, and time. She appears well-developed and well-nourished. She appears distressed.  HENT:  Head: Normocephalic and atraumatic.  Eyes: EOM are normal. Pupils are equal, round, and reactive to light.  Neck: Normal range of motion. Neck supple. Muscular tenderness present. No spinous process tenderness present. Normal range of motion present.  Cardiovascular: Normal rate, regular rhythm, normal heart sounds and intact distal pulses.  Exam reveals no friction rub.   No murmur heard.      Pacemaker intact in the left upper chest wall.  Pulmonary/Chest: Effort normal and breath sounds normal. She has no wheezes. She has no rales. She exhibits bony tenderness. She exhibits no mass, no crepitus and no deformity.    Abdominal: Soft. Bowel sounds are normal. She exhibits no distension. There is no tenderness. There is no rebound and no guarding.  Musculoskeletal: Normal range of motion. She exhibits no tenderness.       No edema  Neurological: She is alert and oriented to person, place, and time. No cranial nerve deficit.  Skin: Skin is warm and dry. No rash noted.  Psychiatric: She has a normal mood and affect. Her behavior is normal.    ED Course  Procedures (including critical care time)  Labs Reviewed - No data to display Dg Chest 2 View  03/31/2011  *RADIOLOGY REPORT*  Clinical Data: Fall, left chest and rib pain.  CHEST - 2 VIEW  Comparison: 11/27/2009  Findings: Left single lead pacer remains in place, unchanged. There is cardiomegaly.  Mild hyperinflation of the lungs.  Lungs are clear.  No effusions or edema.  No acute bony abnormality.  No visible left rib fracture.  No pneumothorax.  IMPRESSION: Cardiomegaly, hyperinflation.  No active disease or change.  Original Report Authenticated By: Cyndie Chime, M.D.   Dg Ribs Unilateral  Left  03/31/2011  *RADIOLOGY REPORT*  Clinical Data: Fall, left rib pain.  LEFT RIBS - 2 VIEW  Comparison: 11/27/2009  Findings: No visible left rib fracture.  The left lung is clear. No pneumothorax or effusion.  Left pacer is in place. Cardiomegaly.  IMPRESSION: No visible left rib fracture.  No pneumothorax.  Original Report Authenticated By: Cyndie Chime, M.D.   Dg Cervical Spine Complete  03/31/2011  *RADIOLOGY REPORT*  Clinical Data: Fall, left neck  pain.  CERVICAL SPINE - COMPLETE 4+ VIEW  Comparison: 09/09/2007  Findings: Degenerative disc disease throughout the cervical spine, most pronounced at C3-4 and C5-6.  There is degenerative facet disease bilaterally.  Uncovertebral spurring and facet disease causes mild right neural foraminal narrowing at C5-6.  No left neural foraminal narrowing.  No fracture.  Prevertebral soft tissues are normal.  No change since prior study.  IMPRESSION: Spondylosis, stable.  No acute findings.  Original Report Authenticated By: Cyndie Chime, M.D.     No diagnosis found.    MDM   Patient had a mechanical fall yesterday with the only complaint today of left rib pain around the 10th and 11th rib at the anterior left chest. Patient denies any shortness of breath or any other area of injury except for some mild left-sided neck pain. She is neurovascularly intact and otherwise has a normal exam. Will get a plain film of the ribs and neck.     10:10 AM Plain films negative for acute injury. Patient's pain is due to contusion.  Gwyneth Sprout, MD 03/31/11 1011

## 2011-03-31 NOTE — ED Notes (Signed)
Family at bedside. 

## 2011-03-31 NOTE — ED Notes (Signed)
Patient transported to X-ray 

## 2011-05-17 ENCOUNTER — Encounter: Payer: Self-pay | Admitting: Internal Medicine

## 2011-05-23 ENCOUNTER — Encounter: Payer: Self-pay | Admitting: Internal Medicine

## 2011-05-23 ENCOUNTER — Ambulatory Visit (INDEPENDENT_AMBULATORY_CARE_PROVIDER_SITE_OTHER): Payer: Medicare HMO | Admitting: Internal Medicine

## 2011-05-23 ENCOUNTER — Ambulatory Visit (INDEPENDENT_AMBULATORY_CARE_PROVIDER_SITE_OTHER): Payer: Medicare HMO | Admitting: Pharmacist

## 2011-05-23 DIAGNOSIS — I639 Cerebral infarction, unspecified: Secondary | ICD-10-CM

## 2011-05-23 DIAGNOSIS — I4891 Unspecified atrial fibrillation: Secondary | ICD-10-CM

## 2011-05-23 DIAGNOSIS — I6350 Cerebral infarction due to unspecified occlusion or stenosis of unspecified cerebral artery: Secondary | ICD-10-CM

## 2011-05-23 DIAGNOSIS — Z7901 Long term (current) use of anticoagulants: Secondary | ICD-10-CM

## 2011-05-23 DIAGNOSIS — Z950 Presence of cardiac pacemaker: Secondary | ICD-10-CM

## 2011-05-23 LAB — PACEMAKER DEVICE OBSERVATION
BATTERY VOLTAGE: 2.73 V
BMOD-0003RV: 30
BMOD-0004RV: 5
RV LEAD AMPLITUDE: 11.2 mv
RV LEAD IMPEDENCE PM: 658 Ohm
VENTRICULAR PACING PM: 8.6

## 2011-05-23 NOTE — Assessment & Plan Note (Signed)
She will continue warfarin. She's had no recurrent symptoms.

## 2011-05-23 NOTE — Patient Instructions (Signed)
Patient instructed to take medications as defined in the Anti-coagulation Track section of this encounter.  Patient instructed to take today's dose.  Patient verbalized understanding of these instructions.    

## 2011-05-23 NOTE — Progress Notes (Signed)
Anti-Coagulation Progress Note  [name] is a 78 y.o. female who is currently on an anti-coagulation regimen.    RECENT RESULTS: Recent results are below, the most recent result is correlated with a dose of 42.5 mg. per week: Lab Results  Component Value Date   INR 2.40 05/23/2011   INR 2.10 02/28/2011   INR 2.5 01/11/2011    ANTI-COAG DOSE:   Latest dosing instructions   Total Sun Mon Tue Wed Thu Fri Sat   42.5 5 mg 7.5 mg 5 mg 7.5 mg 5 mg 7.5 mg 5 mg    (5 mg1) (5 mg1.5) (5 mg1) (5 mg1.5) (5 mg1) (5 mg1.5) (5 mg1)         ANTICOAG SUMMARY: Anticoagulation Episode Summary              Current INR goal 2.0-3.0 Next INR check 06/27/2011   INR from last check 2.40 (05/23/2011)     Weekly max dose (mg)  Target end date Indefinite   Indications Long term current use of anticoagulant, Atrial fibrillation   [facility] check [facility]    Send INR reminders to [facility]TICOAG [facility]   Comments        Provider Role Specialty Phone number   Zoila Shutter, MD  Internal Medicine [phone]        ANTICOAG TODAY: Anticoagulation Summary as of 05/23/2011              INR goal 2.0-3.0     Selected INR 2.40 (05/23/2011) Next INR check 06/27/2011   Weekly max dose (mg)  Target end date Indefinite   Indications Long term current use of anticoagulant, Atrial fibrillation    Anticoagulation Episode Summary              INR check location [facility] Preferred lab    Send INR reminders to ANTICOAG [facility]   Comments        Provider Role Specialty Phone number   Zoila Shutter, MD  Internal Medicine [phone]        PATIENT INSTRUCTIONS: Patient Instructions  Patient instructed to take medications as defined in the Anti-coagulation Track section of this encounter.  Patient instructed to take today's dose.  Patient verbalized understanding of these instructions.        FOLLOW-UP Return in 5 weeks (on 06/27/2011) for Follow up INR.  [name],  [name], CACP

## 2011-05-23 NOTE — Assessment & Plan Note (Signed)
Her ventricular rate is still not as well controlled as I would like. She is on fairly high dose of beta blocker therapy. With recent data suggesting the addition of digoxin to not be helpful, I will not use this at the present time. Were she's symptomatic, I might consider adding a calcium channel blocker. When we see her back if her blood pressure is elevated then I would be inclined in this direction.

## 2011-05-23 NOTE — Progress Notes (Signed)
HPI [name] returns today for followup. She is a very pleasant 78 year old woman with a history of symptomatic tachybradycardia syndrome and chronic atrial fibrillation. She denies medical noncompliance. Her only complaint today is swelling in her right leg. This has been present intermittently for over a year. She gives a remote history of a fractured ankle with surgery. The patient denies chest pain, shortness of breath, or syncope. No Known Allergies   Current Outpatient Prescriptions  Medication Sig Dispense Refill  . cyclobenzaprine (FLEXERIL) 5 MG tablet Take 5 mg by mouth at bedtime as needed. For spams       . furosemide (LASIX) 20 MG tablet Take 20 mg by mouth daily.        . hydroxypropyl methylcellulose (ISOPTO TEARS) 2.5 % ophthalmic solution Place 1 drop into both eyes daily.        Marland Kitchen lisinopril (PRINIVIL,ZESTRIL) 40 MG tablet Take 40 mg by mouth daily.        . metoprolol (LOPRESSOR) 100 MG tablet Take 100 mg by mouth 2 (two) times daily.        Marland Kitchen omeprazole (PRILOSEC) 20 MG capsule Take 20 mg by mouth daily.        . solifenacin (VESICARE) 10 MG tablet Take 10 mg by mouth daily.        Marland Kitchen warfarin (COUMADIN) 5 MG tablet Take 5-7.5 mg by mouth daily. Take 7.5mg  on Mon and Fri.  Take 5mg  all other days.          Past Medical History  Diagnosis Date  . Weight loss, unintentional 2010-2011    8/08-8/10 138-146 lbs, between 8/10-10/11 lost 18 lbs., work-up negative  . Incontinence overflow, stress female     on vesicare  . History of sick sinus syndrome s/p pacemaker Dr. Ladona Duran 2000  . Atrial fibrillation     on coumadin followed by Dr. Alexandria Duran  . History of vaginal bleeding 05/2005    nagative endometrial biopsy  . Restrictive lung disease 1996    PFT's showed mild disease  . Anemia     Iron deficiency, does not tolerate po  . CHF (congestive heart failure)     diastolic dysfunction  . Diabetes mellitus     type 2  diet controlled  . Hypertension     well controlled  on 3 agents  . Osteoporosis   . Stroke 2002    R MCA, cardioembolic  . Allergic rhinitis   . Onychomycosis   . History of trichomonal urethritis   . Arthritis   . DJD (degenerative joint disease)   . Constipation   . Hx of hysterectomy     ROS:   All systems reviewed and negative except as noted in the HPI.   Past Surgical History  Procedure Date  . Abdominal hysterectomy   . Cholecystectomy   . Pacemaker insertion 2000, Dr. Ladona Duran  . Cataract extraction      Family History  Problem Relation Age of Onset  . Diabetes Sister   . Heart disease       History   Social History  . Marital Status: Widowed    Spouse Name: N/A    Number of Children: 8  . Years of Education: N/A   Occupational History  . retired    Social History Main Topics  . Smoking status: Former Games developer  . Smokeless tobacco: Not on file  . Alcohol Use: 0.0 oz/week    0 drink(s) per week     occasional use  .  Drug Use: No  . Sexually Active: Not on file   Other Topics Concern  . Not on file   Social History Narrative   [employer]-[name] home# [phone]# [id number] lives alone, but has 7, of 8, living children who check in on her.She is widowed.     BP 136/54  Pulse 102  Wt 57.153 kg (126 lb)  Physical Exam:  Well appearing elderly woman, NAD HEENT: Unremarkable Neck:  No JVD, no thyromegally Lungs:  Clear with no wheezes, rales, or rhonchi. HEART:  IRegular rate rhythm, no murmurs, no rubs, no clicks Abd:  soft, positive bowel sounds, no organomegally, no rebound, no guarding Ext:  2 plus pulses, 3+ edema right leg, trace edema left leg, no cyanosis, no clubbing Skin:  No rashes no nodules Neuro:  CN II through XII intact, motor grossly intact   DEVICE  Normal device function.  See PaceArt for details. Approximately one year from ER eye.  Assess/Plan:

## 2011-05-23 NOTE — Assessment & Plan Note (Signed)
Her device is working normally. She is approximately one year from elective replacement. She is pacing approximately 10% of the time.

## 2011-05-23 NOTE — Patient Instructions (Addendum)
Your physician wants you to follow-up in: 6 months with Dr Taylor You will receive a reminder letter in the mail two months in advance. If you don't receive a letter, please call our office to schedule the follow-up appointment.  

## 2011-06-15 ENCOUNTER — Encounter: Payer: Self-pay | Admitting: Internal Medicine

## 2011-06-15 ENCOUNTER — Ambulatory Visit (INDEPENDENT_AMBULATORY_CARE_PROVIDER_SITE_OTHER): Payer: Medicare HMO | Admitting: Internal Medicine

## 2011-06-15 VITALS — BP 135/75 | HR 70 | Temp 99.2°F | Wt 124.3 lb

## 2011-06-15 DIAGNOSIS — M542 Cervicalgia: Secondary | ICD-10-CM

## 2011-06-15 DIAGNOSIS — R109 Unspecified abdominal pain: Secondary | ICD-10-CM

## 2011-06-15 DIAGNOSIS — K219 Gastro-esophageal reflux disease without esophagitis: Secondary | ICD-10-CM | POA: Insufficient documentation

## 2011-06-15 DIAGNOSIS — Z79899 Other long term (current) drug therapy: Secondary | ICD-10-CM

## 2011-06-15 DIAGNOSIS — I10 Essential (primary) hypertension: Secondary | ICD-10-CM

## 2011-06-15 DIAGNOSIS — E119 Type 2 diabetes mellitus without complications: Secondary | ICD-10-CM

## 2011-06-15 LAB — POCT GLYCOSYLATED HEMOGLOBIN (HGB A1C): Hemoglobin A1C: 6.5

## 2011-06-15 LAB — GLUCOSE, CAPILLARY: Glucose-Capillary: 175 mg/dL — ABNORMAL HIGH (ref 70–99)

## 2011-06-15 MED ORDER — ALUMINUM-MAGNESIUM-SIMETHICONE 200-200-20 MG/5ML PO SUSP: ORAL | Status: DC

## 2011-06-15 NOTE — Assessment & Plan Note (Addendum)
BP Readings from Last 3 Encounters:  06/15/11 135/75  05/23/11 136/54  03/31/11 155/78    Basic Metabolic Panel:    Component Value Date/Time   NA 138 03/24/2011 1454   K 4.4 03/24/2011 1454   CL 105 03/24/2011 1454   CO2 25 03/24/2011 1454   BUN 17 03/24/2011 1454   CREATININE 1.20* 03/24/2011 1454   CREATININE 0.87 10/18/2010 0600   GLUCOSE 121* 03/24/2011 1454   CALCIUM 8.4 03/24/2011 1454    Assessment: Status: Missing her metoprolol about 2 times a week due to forgetting bedtime doses.  Disease Control: controlled  Progress toward goals: unchanged  Barriers to meeting goals: occasionally missing doses. Daughter helps with meds, but sometimes pt falls asleep before evening doses can be given.   Plan:  Continue current.  Ok to cut metoprolol pills in half for easier swallowing.

## 2011-06-15 NOTE — Patient Instructions (Signed)
   Please follow-up at the clinic in 3-51months, at which time we will reevaluate your diabetes, blood pressure - OR, please follow-up in the clinic sooner if needed.  There have been changes in your medications:  STOP prilosec  START Maalox 3 times daily before meals  Can use heat pack or ice pack for neck pain - do not burn skin by keeping on too long   If you have been started on new medication(s), and you develop symptoms concerning for allergic reaction, including, but not limited to, throat closing, tongue swelling, rash, please stop the medication immediately and call the clinic at [phone], and go to the ER.  If you are diabetic, please bring your meter to your next visit.  If symptoms worsen, or new symptoms arise, please call the clinic or go to the ER.  Please bring all of your medications in a bag to your next visit.

## 2011-06-15 NOTE — Progress Notes (Signed)
Subjective:    Patient ID: [name] female   DOB: [birth year] 78 y.o.   MRN: [id number]  HPI: [name] is a 78 y.o. with a PMHx of atrial fibrillation on coumadin, HTN, who presented to clinic today for the following:  1) Abdominal discomfort - Pt describes an abdominal discomfort "hot like a fever" located in the epigastric area, unable to specify for how long symptoms have persisted.  Symptoms have been stable since time of onset. Aggravating factors: none.  Alleviating factors: none. Associated symptoms: belching and flatus. The patient denies chills, constipation, diarrhea, fever, hematochezia and hematuria. Unable to tolerate her omeprazole because size of pill makes it difficult to swallow.  2) HTN - Patient does not check blood pressure regularly at home. Currently taking lasix 20mg , lisinopril 40mg , metoprolol 100mg  BID. Patient misses doses 2 x per week on average - sometimes falls asleep before taking her lopressor. denies headaches, dizziness, lightheadedness, chest pain, shortness of breath.  does request refills today.   3) Neck pain - Patient describes aching back pain located over left lateral neck. Aggravated by nothing - is always hurting. Alleviated by nothing.. denies associated weakness, numbness, or tingling into arms.   Review of Systems: Per HPI.  Current Outpatient Medications: Medication Sig  . cyclobenzaprine (FLEXERIL) 5 MG tablet Take 5 mg by mouth at bedtime as needed. For spams   . furosemide (LASIX) 20 MG tablet Take 20 mg by mouth daily.    Marland Kitchen lisinopril (PRINIVIL,ZESTRIL) 40 MG tablet Take 40 mg by mouth daily.    . metoprolol (LOPRESSOR) 100 MG tablet Take 100 mg by mouth 2 (two) times daily.    . solifenacin (VESICARE) 10 MG tablet Take 10 mg by mouth daily.    Marland Kitchen warfarin (COUMADIN) 5 MG tablet Take 5-7.5 mg by mouth daily. Take 7.5mg  on Mon and Fri.  Take 5mg  all other days.   Marland Kitchen omeprazole (PRILOSEC) 20 MG capsule Take 20 mg by mouth daily.       Allergies: No Known Allergies   Past Medical History  Diagnosis Date  . Weight loss, unintentional 2010-2011    8/08-8/10 138-146 lbs, between 8/10-10/11 lost 18 lbs., work-up negative  . Incontinence overflow, stress female     on vesicare  . History of sick sinus syndrome s/p pacemaker Dr. [name] 2000  . Atrial fibrillation     on coumadin followed by Dr. Alexandria Lodge  . History of vaginal bleeding 05/2005    nagative endometrial biopsy  . Restrictive lung disease 1996    PFT's showed mild disease  . Anemia     Iron deficiency, does not tolerate po  . CHF (congestive heart failure)     diastolic dysfunction  . Diabetes mellitus     type 2  diet controlled  . Hypertension     well controlled on 3 agents  . Osteoporosis   . Stroke 2002    R MCA, cardioembolic  . Allergic rhinitis   . Onychomycosis   . History of trichomonal urethritis   . Arthritis   . DJD (degenerative joint disease)   . Constipation   . Hx of hysterectomy     Past Surgical History  Procedure Date  . Abdominal hysterectomy   . Cholecystectomy   . Pacemaker insertion 2000, Dr. [name]  . Cataract extraction      Objective:    Physical Exam: Filed Vitals:   06/15/11 1044  BP: 135/75  Pulse: 70  Temp: 99.2 F (37.3 C)  General: Vital signs reviewed and noted. Well-developed, well-nourished, in no acute distress; alert, appropriate and cooperative throughout examination.  Head: Normocephalic, atraumatic.  Lungs:  Normal respiratory effort. Clear to auscultation BL without crackles or wheezes.  Heart: Irregular rate, rhythm S1 and S2 normal without gallop, rubs. No murmur.  Abdomen:  BS normoactive. Soft, Nondistended, non-tender.  No masses or organomegaly.  Extremities: No pretibial edema.    Assessment/ Plan:   Case and plan of care discussed with Dr. Ulyess Mort.

## 2011-06-15 NOTE — Assessment & Plan Note (Signed)
Assessment: Status: Patient has had this neck pain at least since September since seeing Dr. Coralee Pesa last. Last C spine xray was in 03/2011 showing cervical spondylosis. No new radicular symptoms or concerning exam findings for neurologic compromise.  Disease Control: Not controlled.  Barriers to meeting goals: Currently taking on coumadin, and last cr mildly elevated at 1.2, thereby limiting some of the agents such as tylenol and nsaids that I think would be helpful.   Plan:      Heat/ cold to affected area as tolerated.  PRN nsaids with caution, just otc at reduced dose.  Topical bengay, icy hot prn.   ------------------------------------------------------------------------------------------------------------------------------------------------

## 2011-06-15 NOTE — Assessment & Plan Note (Signed)
Likely secondary to uncontrolled GERD, as pt unable to easily swallow the prilosec.  Dc prilosec  Change to maalox liquid.

## 2011-06-27 ENCOUNTER — Ambulatory Visit (INDEPENDENT_AMBULATORY_CARE_PROVIDER_SITE_OTHER): Payer: Medicare HMO | Admitting: Pharmacist

## 2011-06-27 DIAGNOSIS — Z7901 Long term (current) use of anticoagulants: Secondary | ICD-10-CM

## 2011-06-27 DIAGNOSIS — I4891 Unspecified atrial fibrillation: Secondary | ICD-10-CM

## 2011-06-27 LAB — POCT INR: INR: 2.2

## 2011-06-27 NOTE — Patient Instructions (Signed)
Patient instructed to take medications as defined in the Anti-coagulation Track section of this encounter.  Patient instructed to take today's dose.  Patient verbalized understanding of these instructions.    

## 2011-06-27 NOTE — Progress Notes (Signed)
Agree with Dr. Groce's assessment and management plan. 

## 2011-06-27 NOTE — Progress Notes (Signed)
Anti-Coagulation Progress Note  [name] is a 78 y.o. female who is currently on an anti-coagulation regimen.    RECENT RESULTS: Recent results are below, the most recent result is correlated with a dose of 42.5 mg. per week: Lab Results  Component Value Date   INR 2.20 06/27/2011   INR 2.40 05/23/2011   INR 2.10 02/28/2011    ANTI-COAG DOSE:   Latest dosing instructions   Total Sun Mon Tue Wed Thu Fri Sat   45 5 mg 7.5 mg 7.5 mg 7.5 mg 5 mg 7.5 mg 5 mg    (5 mg1) (5 mg1.5) (5 mg1.5) (5 mg1.5) (5 mg1) (5 mg1.5) (5 mg1)         ANTICOAG SUMMARY: Anticoagulation Episode Summary              Current INR goal 2.0-3.0 Next INR check 07/25/2011   INR from last check 2.20 (06/27/2011)     Weekly max dose (mg)  Target end date Indefinite   Indications Long term current use of anticoagulant, Atrial fibrillation   [facility] check location [facility] Preferred lab    Send INR reminders to [facility]TICOAG [facility]   Comments        Provider Role Specialty Phone number   Zoila Shutter, MD  Internal Medicine [phone]        ANTICOAG TODAY: Anticoagulation Summary as of 06/27/2011              INR goal 2.0-3.0     Selected INR 2.20 (06/27/2011) Next INR check 07/25/2011   Weekly max dose (mg)  Target end date Indefinite   Indications Long term current use of anticoagulant, Atrial fibrillation    Anticoagulation Episode Summary              INR check location [facility] Preferred lab    Send INR reminders to ANTICOAG [facility]   Comments        Provider Role Specialty Phone number   Zoila Shutter, MD  Internal Medicine [phone]        PATIENT INSTRUCTIONS: Patient Instructions  Patient instructed to take medications as defined in the Anti-coagulation Track section of this encounter.  Patient instructed to take today's dose.  Patient verbalized understanding of these instructions.        FOLLOW-UP Return in 4 weeks (on 07/25/2011) for Follow up INR.  [name], [name], CACP

## 2011-07-12 ENCOUNTER — Emergency Department (HOSPITAL_COMMUNITY): Payer: Medicare HMO

## 2011-07-12 ENCOUNTER — Encounter (HOSPITAL_COMMUNITY): Payer: Self-pay | Admitting: *Deleted

## 2011-07-12 ENCOUNTER — Emergency Department (HOSPITAL_COMMUNITY)
Admission: EM | Admit: 2011-07-12 | Discharge: 2011-07-12 | Disposition: A | Discharge status: Home / Self Care | Payer: Medicare HMO | Attending: Emergency Medicine | Admitting: Emergency Medicine

## 2011-07-12 DIAGNOSIS — R220 Localized swelling, mass and lump, head: Secondary | ICD-10-CM | POA: Insufficient documentation

## 2011-07-12 DIAGNOSIS — I10 Essential (primary) hypertension: Secondary | ICD-10-CM | POA: Insufficient documentation

## 2011-07-12 DIAGNOSIS — R599 Enlarged lymph nodes, unspecified: Secondary | ICD-10-CM | POA: Insufficient documentation

## 2011-07-12 DIAGNOSIS — M542 Cervicalgia: Secondary | ICD-10-CM | POA: Insufficient documentation

## 2011-07-12 DIAGNOSIS — IMO0002 Reserved for concepts with insufficient information to code with codable children: Secondary | ICD-10-CM | POA: Insufficient documentation

## 2011-07-12 DIAGNOSIS — I509 Heart failure, unspecified: Secondary | ICD-10-CM | POA: Insufficient documentation

## 2011-07-12 DIAGNOSIS — Z7901 Long term (current) use of anticoagulants: Secondary | ICD-10-CM | POA: Insufficient documentation

## 2011-07-12 DIAGNOSIS — E119 Type 2 diabetes mellitus without complications: Secondary | ICD-10-CM | POA: Insufficient documentation

## 2011-07-12 DIAGNOSIS — Z79899 Other long term (current) drug therapy: Secondary | ICD-10-CM | POA: Insufficient documentation

## 2011-07-12 DIAGNOSIS — M810 Age-related osteoporosis without current pathological fracture: Secondary | ICD-10-CM | POA: Insufficient documentation

## 2011-07-12 DIAGNOSIS — Z950 Presence of cardiac pacemaker: Secondary | ICD-10-CM | POA: Insufficient documentation

## 2011-07-12 DIAGNOSIS — Z8673 Personal history of transient ischemic attack (TIA), and cerebral infarction without residual deficits: Secondary | ICD-10-CM | POA: Insufficient documentation

## 2011-07-12 DIAGNOSIS — R221 Localized swelling, mass and lump, neck: Secondary | ICD-10-CM | POA: Insufficient documentation

## 2011-07-12 DIAGNOSIS — I4891 Unspecified atrial fibrillation: Secondary | ICD-10-CM | POA: Insufficient documentation

## 2011-07-12 DIAGNOSIS — R51 Headache: Secondary | ICD-10-CM | POA: Insufficient documentation

## 2011-07-12 DIAGNOSIS — M129 Arthropathy, unspecified: Secondary | ICD-10-CM | POA: Insufficient documentation

## 2011-07-12 MED ORDER — TRAMADOL HCL 50 MG PO TABS: 50.0000 mg | ORAL_TABLET | Freq: Four times a day (QID) | ORAL | Status: AC | PRN

## 2011-07-12 MED ORDER — TRAMADOL HCL 50 MG PO TABS
50.0000 mg | ORAL_TABLET | Freq: Once | ORAL | Status: AC
  Administered 2011-07-12: 50 mg via ORAL
  Filled 2011-07-12: qty 1

## 2011-07-12 NOTE — ED Notes (Signed)
Patient presents with neck pain and has a small palpable lump noted to left lateral neck. Patient reports pain has been present for several weeks; patient can turn neck with slight stiffness present, skin warm, dry and intact, patient alert and orientedx3.

## 2011-07-12 NOTE — ED Provider Notes (Signed)
History     CSN: [id number]  Arrival date & time 07/12/11  1623   First MD Initiated Contact with Patient 07/12/11 1832      Chief Complaint  Patient presents with  . Neck Pain    (Consider location/radiation/quality/duration/timing/severity/associated sxs/prior treatment) Patient is a 78 y.o. female presenting with neck pain. The history is provided by the patient.  Neck Pain  The current episode started more than 1 week ago. The problem occurs constantly. The problem has not changed since onset.There has been no fever. The pain is present in the left side. Radiates to: left head. The pain is moderate. The symptoms are aggravated by twisting. Pertinent negatives include no photophobia, no syncope, no numbness, no headaches, no paresis and no tingling. She has tried nothing for the symptoms.  Pt also has a lump that is sore in the left upper neck.  Pt has a pacemaker and was worried about this being related.  She has an appointment with her doctor but it is not until the beginning of next month  Past Medical History  Diagnosis Date  . Weight loss, unintentional 2010-2011    8/08-8/10 138-146 lbs, between 8/10-10/11 lost 18 lbs., work-up negative  . Incontinence overflow, stress female     on vesicare  . History of sick sinus syndrome s/p pacemaker Dr. [name] 2000  . Atrial fibrillation     on coumadin followed by Dr. Alexandria Lodge  . History of vaginal bleeding 05/2005    nagative endometrial biopsy  . Restrictive lung disease 1996    PFT's showed mild disease  . Anemia     Iron deficiency, does not tolerate po  . CHF (congestive heart failure)     diastolic dysfunction  . Diabetes mellitus     type 2  diet controlled  . Hypertension     well controlled on 3 agents  . Osteoporosis   . Stroke 2002    R MCA, cardioembolic  . Allergic rhinitis   . Onychomycosis   . History of trichomonal urethritis   . Arthritis   . DJD (degenerative joint disease)   . Constipation   . Hx of  hysterectomy     Past Surgical History  Procedure Date  . Abdominal hysterectomy   . Cholecystectomy   . Pacemaker insertion 2000, Dr. [name]  . Cataract extraction     Family History  Problem Relation Age of Onset  . Diabetes Sister   . Heart disease      History  Substance Use Topics  . Smoking status: Former Games developer  . Smokeless tobacco: Not on file  . Alcohol Use: 0.0 oz/week    0 drink(s) per week     occasional use    OB History    Grav Para Term Preterm Abortions TAB SAB Ect Mult Living                  Review of Systems  HENT: Positive for neck pain.   Eyes: Negative for photophobia.  Cardiovascular: Negative for syncope.  Neurological: Negative for tingling, numbness and headaches.  All other systems reviewed and are negative.    Allergies  Review of patient's allergies indicates no known allergies.  Home Medications   Current Outpatient Rx  Name Route Sig Dispense Refill  . CYCLOBENZAPRINE HCL 5 MG PO TABS Oral Take 5 mg by mouth at bedtime as needed. For spams     . FUROSEMIDE 20 MG PO TABS Oral Take 20 mg by mouth daily.      Marland Kitchen  LISINOPRIL 40 MG PO TABS Oral Take 40 mg by mouth daily.      Marland Kitchen METOPROLOL TARTRATE 100 MG PO TABS Oral Take 100 mg by mouth 2 (two) times daily.      Marland Kitchen SOLIFENACIN SUCCINATE 10 MG PO TABS Oral Take 10 mg by mouth daily.      . WARFARIN SODIUM 5 MG PO TABS Oral Take 5-7.5 mg by mouth daily. Take 7.5mg  on Mon, Wed., and Fri.  Take 5mg  all other days.      BP 130/46  Pulse 109  Temp(Src) 98.5 F (36.9 C) (Oral)  Resp 18  SpO2 99%  Physical Exam  Nursing note and vitals reviewed. Constitutional: She appears well-developed and well-nourished. No distress.  HENT:  Head: Normocephalic and atraumatic.  Right Ear: External ear normal.  Left Ear: External ear normal.  Eyes: Conjunctivae are normal. Right eye exhibits no discharge. Left eye exhibits no discharge. No scleral icterus.  Neck: Neck supple. No JVD present. No  tracheal deviation present. No thyromegaly present.  Cardiovascular: Normal rate, regular rhythm and normal heart sounds.   No murmur heard. Pulmonary/Chest: Effort normal. No stridor. No respiratory distress. She has no wheezes. She has no rales.  Abdominal: She exhibits no distension. There is no tenderness.  Musculoskeletal: She exhibits no edema.  Lymphadenopathy:    Cervical adenopathy: 1 -2 cm node left upper ant cervical chain with mild ttp, mobile, no erythema.  Neurological: She is alert. Cranial nerve deficit: no gross deficits.  Skin: Skin is warm and dry. No rash noted.  Psychiatric: She has a normal mood and affect.    ED Course  Procedures (including critical care time)  Labs Reviewed - No data to display Dg Cervical Spine Complete  07/12/2011  *RADIOLOGY REPORT*  Clinical Data: Left-sided neck pain, no known injury  CERVICAL SPINE - COMPLETE 4+ VIEW  Comparison: 03/31/2011  Findings: Five views of the cervical spine submitted.  No acute fracture or subluxation.  There is diffuse osteopenia.  Again noted disc space flattening with mild anterior spurring at C3-C4 and C5- C6 level.  No prevertebral soft tissue swelling.  Stable minimal anterior spurring lower endplate of the C2 vertebral body.  Again noted narrowing of the right neural foramina at C5-C6 level. Cervical airway is patent.  C1-C2 relationship is unremarkable.  IMPRESSION: No acute fracture or subluxation.  Diffuse osteopenia.  Stable degenerative changes.  Original Report Authenticated By: Natasha Mead, M.D.     MDM  Patient has mild neck pain. Her symptoms are not suggestive of meningitis or a carotid dissection. she has no palpable mass on exam other than a lymph node. It is possible that some of her pain may be related to the degenerative changes noted on the cervical spine film. However, she is not showing signs of acute cervical radiculopathy.  She'll be discharged home medications for pain I recommend she followup  with her primary doctor.        Celene Kras, MD 07/12/11 [phone]

## 2011-07-12 NOTE — ED Notes (Signed)
Patient transported to X-ray 

## 2011-07-12 NOTE — ED Notes (Signed)
Patient states she has left sided neck pain that radiates up into her head.  She denies chest pain.  Patient denies trauma.  Patient denies fever.  Patient states she does have unsteady gait at times.  Patient denies recent vision changes.

## 2011-07-12 NOTE — ED Notes (Signed)
Patient reports no recent injury.

## 2011-07-12 NOTE — ED Notes (Signed)
D/c instructions reviewed w/ pt and family - pt and family deny any further questions or concerns at present.\ 

## 2011-07-15 ENCOUNTER — Other Ambulatory Visit: Payer: Self-pay

## 2011-07-15 DIAGNOSIS — Z1231 Encounter for screening mammogram for malignant neoplasm of breast: Secondary | ICD-10-CM

## 2011-07-18 ENCOUNTER — Other Ambulatory Visit: Payer: Self-pay | Admitting: Internal Medicine

## 2011-07-19 ENCOUNTER — Ambulatory Visit (INDEPENDENT_AMBULATORY_CARE_PROVIDER_SITE_OTHER): Payer: Medicare HMO | Admitting: Internal Medicine

## 2011-07-19 ENCOUNTER — Encounter: Payer: Self-pay | Admitting: Internal Medicine

## 2011-07-19 VITALS — BP 127/62 | HR 93 | Temp 97.2°F | Ht 64.0 in | Wt 120.0 lb

## 2011-07-19 DIAGNOSIS — E119 Type 2 diabetes mellitus without complications: Secondary | ICD-10-CM

## 2011-07-19 DIAGNOSIS — I10 Essential (primary) hypertension: Secondary | ICD-10-CM

## 2011-07-19 DIAGNOSIS — K219 Gastro-esophageal reflux disease without esophagitis: Secondary | ICD-10-CM

## 2011-07-19 DIAGNOSIS — M542 Cervicalgia: Secondary | ICD-10-CM

## 2011-07-19 LAB — GLUCOSE, CAPILLARY: Glucose-Capillary: 103 mg/dL — ABNORMAL HIGH (ref 70–99)

## 2011-07-19 MED ORDER — ACETAMINOPHEN 500 MG PO TABS: 500.0000 mg | ORAL_TABLET | Freq: Four times a day (QID) | ORAL | Status: DC

## 2011-07-19 NOTE — Assessment & Plan Note (Signed)
Pertinent Labs: Lab Results  Component Value Date   HGBA1C 6.5 06/15/2011   HGBA1C 6.8* 10/17/2010   CREATININE 1.20* 03/24/2011   CREATININE 0.87 10/18/2010   MICROALBUR 1.36 05/26/2009   MICRALBCREAT 13.9 05/26/2009   CHOL 125 10/17/2010   HDL 57 10/17/2010   TRIG 159* 10/17/2010    Assessment: Status: - Patient is diet controlled on her diabetes.    Disease Control: controlled  Progress toward goals: at goal  Barriers to meeting goals: no barriers identified   Plan: Glucometer log was not reviewed today, as pt did not have glucometer available for review.     Continue diet control

## 2011-07-19 NOTE — Assessment & Plan Note (Signed)
Assessment: Patient previously had uncontrolled GERD because she was unable to swallow her Prilosec. During the last visit, February 2013, she was changed to liquid Maalox. With this adjustment in therapy, the patient has achieved adequate control of her symptoms.  Plan:      Continue Maalox liquid when necessary.

## 2011-07-19 NOTE — Progress Notes (Signed)
Subjective:    Patient ID: [name] female   DOB: 1926/01/29 78 y.o.   MRN: [id number]  HPI: [name] is a 78 y.o. with a PMHx of atrial fibrillation on coumadin, HTN, who presented to clinic today for the following:  1) ER Follow-up - Patient evaluated at [facility] ER on 07/12/2011 for evaluation of this lateral left neck pain, which was determined to be secondary to arthritis, degenerative changes, as evidenced on cervical XR. Was prescribed with tramadol, which she has only been using once a day. Still continues to have the pain, with minimal relief of the pain with tramadol.  2) Neck pain - chronic neck pain. Pain is worse with activity, difficult to pull out of bed. No radiation of the pain.  Symptoms have been getting progressively worse, has been intermittent since that time. There is no numbness, tingling, weakness in the arms.  3) Abdominal pain - During last visit, pt described an abdominal discomfort "hot like a fever" located in the epigastric area, unable to specify for how long symptoms have persisted.She was unable to tolerate her omeprazole because size of pill makes it difficult to swallow. Therefore, we changed to liquid Maalox, and since change, has noted stabilization of symptoms. No nausea, vomiting, diarrhea, abdominal pain.  4) DMII (HgA1c of 6.5 in 02/27) - Patient not taking medications, is diet controlled. denies polyuria, polydipsia, nausea, vomiting, diarrhea.    Review of Systems: Per HPI.  Current Outpatient Medications: Medication Sig  . cyclobenzaprine (FLEXERIL) 5 MG tablet Take 5 mg by mouth at bedtime as needed. For spams   . furosemide (LASIX) 20 MG tablet Take 20 mg by mouth daily.    Marland Kitchen lisinopril (PRINIVIL,ZESTRIL) 40 MG tablet Take 1 tablet (40 mg total) by mouth daily.  . metoprolol (LOPRESSOR) 100 MG tablet Take 100 mg by mouth 2 (two) times daily.    . solifenacin (VESICARE) 10 MG tablet Take 10 mg by mouth daily.    .  traMADol (ULTRAM) 50 MG tablet Take 1 tablet (50 mg total) by mouth every 6 (six) hours as needed for pain.  Marland Kitchen warfarin (COUMADIN) 5 MG tablet Take 5-7.5 mg by mouth daily. Take 7.5mg  on Mon, Wed., and Fri.  Take 5mg  all other days.   Allergies: No Known Allergies  Past Medical History  Diagnosis Date  . Weight loss, unintentional 2010-2011    8/08-8/10 138-146 lbs, between 8/10-10/11 lost 18 lbs., work-up negative  . Incontinence overflow, stress female     on vesicare  . History of sick sinus syndrome s/p pacemaker Dr. [name] 2000  . Atrial fibrillation     on coumadin followed by Dr. Alexandria Lodge  . History of vaginal bleeding 05/2005    nagative endometrial biopsy  . Restrictive lung disease 1996    PFT's showed mild disease  . Anemia     Iron deficiency, does not tolerate po  . CHF (congestive heart failure)     diastolic dysfunction  . Diabetes mellitus     type 2  diet controlled  . Hypertension     well controlled on 3 agents  . Osteoporosis   . Stroke 2002    R MCA, cardioembolic  . Allergic rhinitis   . Onychomycosis   . History of trichomonal urethritis   . Arthritis   . DJD (degenerative joint disease)   . Constipation   . Hx of hysterectomy     Past Surgical History  Procedure Date  . Abdominal  hysterectomy   . Cholecystectomy   . Pacemaker insertion 2000, Dr. [name]  . Cataract extraction      Objective:    Physical Exam: Filed Vitals:   07/19/11 1039  BP: 127/62  Pulse: 93  Temp: 97.2 F (36.2 C)     General: Vital signs reviewed and noted. Well-developed, well-nourished, in no acute distress; alert, appropriate and cooperative throughout examination.  Head: Normocephalic, atraumatic. Tenderness to palpation over her left posterior aspect ofSCM muscle and trapezius musculature. No central tenderness to palpation over cervical spine. Full range of motion.  Lungs:  Normal respiratory effort. Clear to auscultation BL without crackles or wheezes.    Heart: Irregular rate, rhythm S1 and S2 normal without gallop, rubs. No murmur.  Abdomen:  BS normoactive. Soft, Nondistended, non-tender.  No masses or organomegaly.  Extremities: No pretibial edema. UE muscle strength 5/5 BL. Grip strength 5/5 BL.    Assessment/ Plan:   Case and plan of care discussed with Dr. Doneen Poisson.

## 2011-07-19 NOTE — Patient Instructions (Signed)
   Please follow-up at the clinic in 2-3 weeks, at which time we will reevaluate your neck pain - OR, please follow-up in the clinic sooner if needed.  There have been changes in your medications:  START tylenol 500mg  every 6 hours for next 2 weeks  CONTINUE tramadol for severe pain - can use it every 6 hours as needed.   If you have been started on new medication(s), and you develop symptoms concerning for allergic reaction, including, but not limited to, throat closing, tongue swelling, rash, please stop the medication immediately and call the clinic at [phone], and go to the ER.  Follow up with physical therapy.  If symptoms worsen, or new symptoms arise, please call the clinic or go to the ER.  Please bring all of your medications in a bag to your next visit.

## 2011-07-19 NOTE — Assessment & Plan Note (Signed)
Pertinent Data: BP Readings from Last 3 Encounters:  07/19/11 127/62  07/12/11 130/46  06/15/11 135/75    Basic Metabolic Panel:    Component Value Date/Time   NA 138 03/24/2011 1454   K 4.4 03/24/2011 1454   CL 105 03/24/2011 1454   CO2 25 03/24/2011 1454   BUN 17 03/24/2011 1454   CREATININE 1.20* 03/24/2011 1454   CREATININE 0.87 10/18/2010 0600   GLUCOSE 121* 03/24/2011 1454   CALCIUM 8.4 03/24/2011 1454    Assessment: Status: - Patient is compliant most of the time with prescribed medications.   Disease Control: controlled  Progress toward goals: at goal  Barriers to meeting goals: no barriers identified   Plan:  continue current medications

## 2011-07-19 NOTE — Assessment & Plan Note (Signed)
Assessment:  Likely both muscular and skeletal component secondary to the degenerative changes that have been noted to be stable between 03/2009 and 06/2011 cervical XR.  CKD limits some interventions.  On chronic coumadin therefore, want to avoid high dose tylenol.  Plan:      Continue heat/ cold to affected areas as tolerated.  Discussed with Dr. Josem Kaufmann, and we will add scheduled tylenol QID x 2 weeks, bring back to see if effective - will need to recheck INR at that time and make sure not affected too much.  Physical therapy referral.  Can continue PRN tramadol for severe pain - patient has been under utilizing - using only once a day.

## 2011-07-25 ENCOUNTER — Ambulatory Visit: Payer: Medicare HMO

## 2011-08-04 ENCOUNTER — Encounter: Payer: Medicare HMO | Admitting: Internal Medicine

## 2011-08-08 ENCOUNTER — Emergency Department (HOSPITAL_COMMUNITY)
Admission: EM | Admit: 2011-08-08 | Discharge: 2011-08-08 | Disposition: A | Discharge status: Home / Self Care | Payer: Medicare HMO | Attending: Emergency Medicine | Admitting: Emergency Medicine

## 2011-08-08 ENCOUNTER — Encounter (HOSPITAL_COMMUNITY): Payer: Self-pay | Admitting: *Deleted

## 2011-08-08 DIAGNOSIS — M810 Age-related osteoporosis without current pathological fracture: Secondary | ICD-10-CM | POA: Insufficient documentation

## 2011-08-08 DIAGNOSIS — M1990 Unspecified osteoarthritis, unspecified site: Secondary | ICD-10-CM | POA: Insufficient documentation

## 2011-08-08 DIAGNOSIS — Z87891 Personal history of nicotine dependence: Secondary | ICD-10-CM | POA: Insufficient documentation

## 2011-08-08 DIAGNOSIS — Z8673 Personal history of transient ischemic attack (TIA), and cerebral infarction without residual deficits: Secondary | ICD-10-CM | POA: Insufficient documentation

## 2011-08-08 DIAGNOSIS — I4891 Unspecified atrial fibrillation: Secondary | ICD-10-CM | POA: Insufficient documentation

## 2011-08-08 DIAGNOSIS — M129 Arthropathy, unspecified: Secondary | ICD-10-CM | POA: Insufficient documentation

## 2011-08-08 DIAGNOSIS — D509 Iron deficiency anemia, unspecified: Secondary | ICD-10-CM | POA: Insufficient documentation

## 2011-08-08 DIAGNOSIS — Z7901 Long term (current) use of anticoagulants: Secondary | ICD-10-CM | POA: Insufficient documentation

## 2011-08-08 DIAGNOSIS — Z87440 Personal history of urinary (tract) infections: Secondary | ICD-10-CM | POA: Insufficient documentation

## 2011-08-08 DIAGNOSIS — M542 Cervicalgia: Secondary | ICD-10-CM

## 2011-08-08 DIAGNOSIS — E119 Type 2 diabetes mellitus without complications: Secondary | ICD-10-CM | POA: Insufficient documentation

## 2011-08-08 DIAGNOSIS — Z90710 Acquired absence of both cervix and uterus: Secondary | ICD-10-CM | POA: Insufficient documentation

## 2011-08-08 DIAGNOSIS — I10 Essential (primary) hypertension: Secondary | ICD-10-CM | POA: Insufficient documentation

## 2011-08-08 DIAGNOSIS — I509 Heart failure, unspecified: Secondary | ICD-10-CM | POA: Insufficient documentation

## 2011-08-08 NOTE — ED Provider Notes (Signed)
Medical screening examination/treatment/procedure(s) were conducted as a shared visit with non-physician practitioner(s) and myself.  I personally evaluated the patient during the encounter On my exam this elderly female with neck pain that has been there for more than one month he is in no distress.  She is tenderness to palpation about the lateral edge of her mid cervical region.  The pain is worse with motion suggesting inflammation of the trapezius near its insertion.  The denial of any neurologic complaints, or other notable findings, as well described by the physician assistant is reassuring.  I spent a significant amount of time with the patient regarding instructions on adequate pain control and the importance of PMD followup.  She was discharged in stable condition.  Gerhard Munch, MD 08/08/11 [phone]

## 2011-08-08 NOTE — ED Notes (Signed)
To ED for eval of left sided neck pain for the past 3 weeks. Denies injury. Pain increases with movement.

## 2011-08-08 NOTE — Discharge Instructions (Signed)
Take your tramadol as prescribed. Call the orthopedic doctor for a follow up appointment to further evaluate your neck pain. Read instructions below on Ice therapy  HOME CARE INSTRUCTIONS   Put ice on the injured area.   Put ice in a plastic bag.   Place a towel between your skin and the bag.   Leave the ice on for 15 to 20 minutes, 3 to 4 times a day.   Only take over-the-counter or prescription medicines for pain, discomfort, or fever as directed by your caregiver.   Keep all follow-up appointments as directed by your caregiver.   Keep all physical therapy appointments as directed by your caregiver.    Make any needed adjustments to your work station to promote good posture.   Avoid positions and activities that make your symptoms worse.   Warm up and stretch before being active to help prevent problems.  SEEK MEDICAL CARE IF:   Your pain is not controlled with medicine.   You are unable to decrease your pain medicine over time as planned.   Your activity level is not improving as expected.  SEEK IMMEDIATE MEDICAL CARE IF:   You develop any bleeding, stomach upset, or signs of an allergic reaction to your medicine.   Your symptoms get worse.   You develop new, unexplained symptoms.   You have numbness, tingling, weakness, or paralysis in any part of your body.  MAKE SURE YOU:   Understand these instructions.   Will watch your condition.   Will get help right away if you are not doing well or get worse.  Document Released: 01/30/2007 Document Revised: 03/24/2011 Document Reviewed: 01/05/2011 [facility] Patient Information 2012 [place], [place].

## 2011-08-08 NOTE — ED Provider Notes (Signed)
History     CSN: [id number]  Arrival date & time 08/08/11  4098   First MD Initiated Contact with Patient 08/08/11 1005      Chief Complaint  Patient presents with  . Neck Pain    (Consider location/radiation/quality/duration/timing/severity/associated sxs/prior treatment) HPI Comments: Patient with a history of diabetes, CHF, stroke, DJD, & osteoporosis  presents to the emergency department with a chief complaint of neck pain.  Patient was recently evaluated in this emergency department for similar pain.  Onset of symptoms began about 3 weeks ago.  Described as left neck and spine pain that occasionally radiates to the head.  Severity is 6/10.  Patient denies associated symptoms including chest pain, shortness of breath, jaw pain, arm pain, palpitations, leg swelling, dental pain, nausea, diaphoresis, dizziness, ataxia, weakness, numbness or tingling of upper extremities.  Patient denies any history of trauma.  Patient is a 78 y.o. female presenting with neck pain. The history is provided by the patient.  Neck Pain  Pertinent negatives include no chest pain, no numbness, no headaches and no weakness.    Past Medical History  Diagnosis Date  . Weight loss, unintentional 2010-2011    8/08-8/10 138-146 lbs, between 8/10-10/11 lost 18 lbs., work-up negative  . Incontinence overflow, stress female     on vesicare  . History of sick sinus syndrome s/p pacemaker Dr. [name] 2000  . Atrial fibrillation     on coumadin followed by Dr. Alexandria Lodge  . History of vaginal bleeding 05/2005    nagative endometrial biopsy  . Restrictive lung disease 1996    PFT's showed mild disease  . Anemia     Iron deficiency, does not tolerate po  . CHF (congestive heart failure)     diastolic dysfunction  . Diabetes mellitus     type 2  diet controlled  . Hypertension     well controlled on 3 agents  . Osteoporosis   . Stroke 2002    R MCA, cardioembolic  . Allergic rhinitis   . Onychomycosis   .  History of trichomonal urethritis   . Arthritis   . DJD (degenerative joint disease)   . Constipation   . Hx of hysterectomy     Past Surgical History  Procedure Date  . Abdominal hysterectomy   . Cholecystectomy   . Pacemaker insertion 2000, Dr. [name]  . Cataract extraction     Family History  Problem Relation Age of Onset  . Diabetes Sister   . Heart disease      History  Substance Use Topics  . Smoking status: Former Games developer  . Smokeless tobacco: Not on file  . Alcohol Use: 0.0 oz/week    0 drink(s) per week     occasional use    OB History    Grav Para Term Preterm Abortions TAB SAB Ect Mult Living                  Review of Systems  Constitutional: Negative for fever, chills and appetite change.  HENT: Positive for neck pain. Negative for congestion and neck stiffness.   Eyes: Negative for visual disturbance.  Respiratory: Negative for shortness of breath.   Cardiovascular: Negative for chest pain and leg swelling.  Gastrointestinal: Negative for abdominal pain.  Genitourinary: Negative for dysuria, urgency and frequency.  Neurological: Negative for dizziness, syncope, weakness, light-headedness, numbness and headaches.  Psychiatric/Behavioral: Negative for confusion.  All other systems reviewed and are negative.    Allergies  Review of patient's  allergies indicates no known allergies.  Home Medications   Current Outpatient Rx  Name Route Sig Dispense Refill  . ACETAMINOPHEN 500 MG PO TABS Oral Take 1 tablet (500 mg total) by mouth every 6 (six) hours. 60 tablet 0  . CYCLOBENZAPRINE HCL 5 MG PO TABS Oral Take 5 mg by mouth at bedtime as needed. For spams     . FUROSEMIDE 20 MG PO TABS Oral Take 20 mg by mouth daily.      Marland Kitchen LISINOPRIL 40 MG PO TABS Oral Take 1 tablet (40 mg total) by mouth daily. 31 tablet 5  . METOPROLOL TARTRATE 100 MG PO TABS Oral Take 100 mg by mouth 2 (two) times daily.      Marland Kitchen SOLIFENACIN SUCCINATE 10 MG PO TABS Oral Take 10 mg  by mouth daily.      . WARFARIN SODIUM 5 MG PO TABS Oral Take 5-7.5 mg by mouth daily. Take 7.5mg  on Mon, Wed., and Fri.  Take 5mg  all other days.      BP 133/57  Pulse 68  Temp(Src) 98.2 F (36.8 C) (Oral)  Resp 16  SpO2 100%  Physical Exam  Nursing note and vitals reviewed. Constitutional: She is oriented to person, place, and time. She appears well-developed and well-nourished. No distress.  HENT:  Head: Normocephalic and atraumatic.  Eyes: Conjunctivae and EOM are normal. Pupils are equal, round, and reactive to light. No scleral icterus.  Neck: Normal range of motion and full passive range of motion without pain. Neck supple. No JVD present. Carotid bruit is not present. No rigidity. No Brudzinski's sign noted.    Cardiovascular: Normal rate, regular rhythm, normal heart sounds and intact distal pulses.   Pulmonary/Chest: Effort normal and breath sounds normal. No respiratory distress. She has no wheezes. She has no rales.  Musculoskeletal: Normal range of motion.  Lymphadenopathy:    She has no cervical adenopathy.  Neurological: She is alert and oriented to person, place, and time. She has normal strength. No cranial nerve deficit or sensory deficit. She displays a negative Romberg sign. Coordination and gait normal. GCS eye subscore is 4. GCS verbal subscore is 5. GCS motor subscore is 6.       A&O x3.  Able to follow commands. PERRL, EOMs, no vertical or bidirectional nystagmus. Shoulder shrug, facial muscles, tongue protrusion and swallow intact.  Motor strength 5/5 bilaterally including grip strength, triceps, hamstrings and ankle dorsiflexion.  Light touch intact in all 4 distal limbs.  Intact finger to nose, shin to heel and rapid alternating movements. No ataxia or dysequilibrium.   Skin: Skin is warm and dry. No rash noted. She is not diaphoretic.  Psychiatric: She has a normal mood and affect. Her behavior is normal.    ED Course  Procedures (including critical care  time)  Labs Reviewed - No data to display No results found.   No diagnosis found.    MDM  Neck pain  Pt was seen last month for similar complaint and did not follow up. Pt present today with atraumatic neck pain x 35mo, C-spine xrays last month showed no acute abnormalities. Patient with out any focal neuro deficits, extremity numbness or tingling. Not concerning for nerve impingement. Orthopedic f-u recommended. Will treat symptomatically.  Discussed with Dr. Jeraldine Loots who has seen pt and agrees with plan         Jaci Carrel, PA-C 08/08/11 1108

## 2011-08-11 ENCOUNTER — Ambulatory Visit (HOSPITAL_COMMUNITY)
Admission: RE | Admit: 2011-08-11 | Discharge: 2011-08-11 | Disposition: A | Discharge status: Home / Self Care | Payer: Medicare HMO | Source: Ambulatory Visit | Attending: Internal Medicine | Admitting: Internal Medicine

## 2011-08-11 DIAGNOSIS — Z1231 Encounter for screening mammogram for malignant neoplasm of breast: Secondary | ICD-10-CM | POA: Insufficient documentation

## 2011-08-16 ENCOUNTER — Other Ambulatory Visit: Payer: Self-pay | Admitting: Internal Medicine

## 2011-09-07 NOTE — Progress Notes (Signed)
Addended by: Neomia Dear on: 09/07/2011 06:31 PM   Modules accepted: Orders

## 2011-09-15 ENCOUNTER — Encounter: Payer: Medicare HMO | Admitting: Internal Medicine

## 2011-09-26 ENCOUNTER — Ambulatory Visit (INDEPENDENT_AMBULATORY_CARE_PROVIDER_SITE_OTHER): Payer: Medicare HMO | Admitting: Pharmacist

## 2011-09-26 DIAGNOSIS — I4891 Unspecified atrial fibrillation: Secondary | ICD-10-CM

## 2011-09-26 DIAGNOSIS — Z7901 Long term (current) use of anticoagulants: Secondary | ICD-10-CM

## 2011-09-26 LAB — POCT INR: INR: 1.8

## 2011-09-26 NOTE — Progress Notes (Signed)
Anti-Coagulation Progress Note  [name] is a 78 y.o. female who is currently on an anti-coagulation regimen.    RECENT RESULTS: Recent results are below, the most recent result is correlated with a dose of 45 mg. per week: Lab Results  Component Value Date   INR 1.80 09/26/2011   INR 2.20 06/27/2011   INR 2.40 05/23/2011    ANTI-COAG DOSE:   Latest dosing instructions   Total Sun Mon Tue Wed Thu Fri Sat   50 5 mg 7.5 mg 7.5 mg 7.5 mg 7.5 mg 7.5 mg 7.5 mg    (5 mg1) (5 mg1.5) (5 mg1.5) (5 mg1.5) (5 mg1.5) (5 mg1.5) (5 mg1.5)         ANTICOAG SUMMARY: Anticoagulation Episode Summary              Current INR goal 2.0-3.0 Next INR check 10/17/2011   INR from last check 1.80! (09/26/2011)     Weekly max dose (mg)  Target end date Indefinite   Indications Long term current use of anticoagulant, Atrial fibrillation   INR check location [facility] Preferred lab    Send INR reminders to ANTICOAG [facility]   Comments        Provider Role Specialty Phone number   Zoila Shutter, MD  Internal Medicine [phone]        ANTICOAG TODAY: Anticoagulation Summary as of 09/26/2011              INR goal 2.0-3.0     Selected INR 1.80! (09/26/2011) Next INR check 10/17/2011   Weekly max dose (mg)  Target end date Indefinite   Indications Long term current use of anticoagulant, Atrial fibrillation    Anticoagulation Episode Summary              INR check location [facility] Preferred lab    Send INR reminders to ANTICOAG [facility]   Comments        Provider Role Specialty Phone number   Zoila Shutter, MD  Internal Medicine [phone]        PATIENT INSTRUCTIONS: Patient Instructions  Patient instructed to take medications as defined in the Anti-coagulation Track section of this encounter.  Patient instructed to take today's dose.  Patient verbalized understanding of these instructions.        FOLLOW-UP Return in 3 weeks (on 10/17/2011) for Follow up INR at  1115h.  [name], [name], CACP

## 2011-09-26 NOTE — Patient Instructions (Signed)
Patient instructed to take medications as defined in the Anti-coagulation Track section of this encounter.  Patient instructed to take today's dose.  Patient verbalized understanding of these instructions.    

## 2011-09-27 NOTE — Progress Notes (Signed)
[name]' interim history was reviewed and I agree with Dr. Saralyn Duran assessment and plan as documented.

## 2011-10-13 ENCOUNTER — Encounter: Payer: Medicare HMO | Admitting: Internal Medicine

## 2011-10-17 ENCOUNTER — Other Ambulatory Visit: Payer: Self-pay | Admitting: *Deleted

## 2011-10-17 ENCOUNTER — Ambulatory Visit (INDEPENDENT_AMBULATORY_CARE_PROVIDER_SITE_OTHER): Payer: Medicare HMO | Admitting: Pharmacist

## 2011-10-17 DIAGNOSIS — Z7901 Long term (current) use of anticoagulants: Secondary | ICD-10-CM

## 2011-10-17 DIAGNOSIS — I4891 Unspecified atrial fibrillation: Secondary | ICD-10-CM

## 2011-10-17 LAB — POCT INR: INR: 4.9

## 2011-10-17 MED ORDER — TRAMADOL HCL 50 MG PO TABS: 50.0000 mg | ORAL_TABLET | Freq: Four times a day (QID) | ORAL | Status: DC

## 2011-10-17 NOTE — Telephone Encounter (Signed)
I talked with Dr Rogelia Boga and she suggested taking tramadol q 6 hours consistently and see if that will help. Daughter voices understanding. Appointment give for next week. They are out of tramadol and will go by pharmacy to get meds. Will you refill for her?

## 2011-10-17 NOTE — Telephone Encounter (Signed)
Rx has been called in  

## 2011-10-17 NOTE — Patient Instructions (Signed)
Patient instructed to take medications as defined in the Anti-coagulation Track section of this encounter.  Patient instructed to OMIT today's dose.  Patient verbalized understanding of these instructions.    

## 2011-10-17 NOTE — Telephone Encounter (Signed)
Hi Gayle, I put as a phone in order, as it for some reason wouldn't let me enter it as an e-prescription. Can you please call in?  Also, please let the patient know she needs to come for her appointments, she keeps no showing I think at least twice within past few months with me.  Thank you!!  Sincerely, Johnette Abraham, D.O.

## 2011-10-17 NOTE — Telephone Encounter (Signed)
Pt walked into clinic with c/o to left side of neck.Onset about 3 month ago. No known  Cause.  She has tried ice, she was told not to use heat or pain meds. She has been seen in clinic and ED for this pain.  She has tried tramadol without relief.   She is here today because of increase pain. She is rating pain 10/10.  We have no openings this week.  Pt wants to know if she can take any med that is a little stronger.  Xrays were done and showed arthritis. Per daughter.

## 2011-10-17 NOTE — Progress Notes (Signed)
Anti-Coagulation Progress Note  [name] is a 78 y.o. female who is currently on an anti-coagulation regimen.    RECENT RESULTS: Recent results are below, the most recent result is correlated with a dose of 50 mg. per week: Lab Results  Component Value Date   INR 4.90 10/17/2011   INR 1.80 09/26/2011   INR 2.20 06/27/2011    ANTI-COAG DOSE:   Latest dosing instructions   Total Sun Mon Tue Wed Thu Fri Sat   42.5 5 mg 7.5 mg 5 mg 7.5 mg 5 mg 7.5 mg 5 mg    (5 mg1) (5 mg1.5) (5 mg1) (5 mg1.5) (5 mg1) (5 mg1.5) (5 mg1)         ANTICOAG SUMMARY: Anticoagulation Episode Summary              Current INR goal 2.0-3.0 Next INR check 10/31/2011   INR from last check 4.90! (10/17/2011)     Weekly max dose (mg)  Target end date Indefinite   Indications Long term current use of anticoagulant, Atrial fibrillation   INR check location [facility] Preferred lab    Send INR reminders to [facility]   Comments        Provider Role Specialty Phone number   Zoila Shutter, MD  Internal Medicine [phone]        ANTICOAG TODAY: Anticoagulation Summary as of 10/17/2011              INR goal 2.0-3.0     Selected INR 4.90! (10/17/2011) Next INR check 10/31/2011   Weekly max dose (mg)  Target end date Indefinite   Indications Long term current use of anticoagulant, Atrial fibrillation    Anticoagulation Episode Summary              INR check location [facility] Preferred lab    Send INR reminders to [facility]   Comments        Provider Role Specialty Phone number   Zoila Shutter, MD  Internal Medicine [phone]        PATIENT INSTRUCTIONS: Patient Instructions  Patient instructed to take medications as defined in the Anti-coagulation Track section of this encounter.  Patient instructed to OMIT today's dose.  Patient verbalized understanding of these instructions.        FOLLOW-UP Return in 2 weeks (on 10/31/2011), or Follow up INR at  1030h.  [name], [name], CACP

## 2011-10-18 NOTE — Telephone Encounter (Signed)
Talked with daughter and she states pt will be here on Monday 7/8 for appointment.

## 2011-10-24 ENCOUNTER — Encounter: Payer: Medicare HMO | Admitting: Internal Medicine

## 2011-10-31 ENCOUNTER — Ambulatory Visit (INDEPENDENT_AMBULATORY_CARE_PROVIDER_SITE_OTHER): Payer: Medicare HMO | Admitting: Pharmacist

## 2011-10-31 DIAGNOSIS — I4891 Unspecified atrial fibrillation: Secondary | ICD-10-CM

## 2011-10-31 DIAGNOSIS — Z7901 Long term (current) use of anticoagulants: Secondary | ICD-10-CM

## 2011-10-31 MED ORDER — WARFARIN SODIUM 5 MG PO TABS: ORAL_TABLET | ORAL | Status: DC

## 2011-10-31 NOTE — Patient Instructions (Signed)
Patient instructed to take medications as defined in the Anti-coagulation Track section of this encounter.  Patient instructed to take today's dose.  Patient verbalized understanding of these instructions.    

## 2011-10-31 NOTE — Progress Notes (Signed)
Anti-Coagulation Progress Note  [name] is a 78 y.o. female who is currently on an anti-coagulation regimen.    RECENT RESULTS: Recent results are below, the most recent result is correlated with a dose of 42.5 mg. per week: Lab Results  Component Value Date   INR 4.2 10/31/2011   INR 4.90 10/17/2011   INR 1.80 09/26/2011    ANTI-COAG DOSE:   Latest dosing instructions   Total Sun Mon Tue Wed Thu Fri Sat   37.5 5 mg 5 mg 5 mg 7.5 mg 5 mg 5 mg 5 mg    (5 mg1) (5 mg1) (5 mg1) (5 mg1.5) (5 mg1) (5 mg1) (5 mg1)         ANTICOAG SUMMARY: Anticoagulation Episode Summary              Current INR goal 2.0-3.0 Next INR check 11/14/2011   INR from last check 4.2! (10/31/2011)     Weekly max dose (mg)  Target end date Indefinite   Indications Long term current use of anticoagulant, Atrial fibrillation   INR check location [facility] Preferred [facility]    Send INR reminders to [facility]   Comments        Provider Role Specialty Phone number   Zoila Shutter, MD  Internal Medicine [phone]        ANTICOAG TODAY: Anticoagulation Summary as of 10/31/2011              INR goal 2.0-3.0     Selected INR 4.2! (10/31/2011) Next INR check 11/14/2011   Weekly max dose (mg)  Target end date Indefinite   Indications Long term current use of anticoagulant, Atrial fibrillation    Anticoagulation Episode Summary              INR check location [facility] Preferred lab    Send INR reminders to [facility]   Comments        Provider Role Specialty Phone number   Zoila Shutter, MD  Internal Medicine [phone]        PATIENT INSTRUCTIONS: Patient Instructions  Patient instructed to take medications as defined in the Anti-coagulation Track section of this encounter.  Patient instructed to take today's dose.  Patient verbalized understanding of these instructions.        FOLLOW-UP Return in 2 weeks (on 11/14/2011) for Follow up INR at 1145h.  [name], [name], CACP

## 2011-11-14 ENCOUNTER — Ambulatory Visit: Payer: Medicare HMO

## 2011-11-16 ENCOUNTER — Encounter: Payer: Self-pay | Admitting: Internal Medicine

## 2011-11-16 ENCOUNTER — Ambulatory Visit (INDEPENDENT_AMBULATORY_CARE_PROVIDER_SITE_OTHER): Payer: Medicare HMO | Admitting: Internal Medicine

## 2011-11-16 VITALS — BP 131/63 | HR 67 | Temp 97.4°F | Ht 60.0 in | Wt 125.4 lb

## 2011-11-16 DIAGNOSIS — N393 Stress incontinence (female) (male): Secondary | ICD-10-CM

## 2011-11-16 DIAGNOSIS — Z7901 Long term (current) use of anticoagulants: Secondary | ICD-10-CM

## 2011-11-16 DIAGNOSIS — N390 Urinary tract infection, site not specified: Secondary | ICD-10-CM

## 2011-11-16 DIAGNOSIS — I4891 Unspecified atrial fibrillation: Secondary | ICD-10-CM

## 2011-11-16 DIAGNOSIS — E119 Type 2 diabetes mellitus without complications: Secondary | ICD-10-CM

## 2011-11-16 DIAGNOSIS — N189 Chronic kidney disease, unspecified: Secondary | ICD-10-CM

## 2011-11-16 DIAGNOSIS — I10 Essential (primary) hypertension: Secondary | ICD-10-CM

## 2011-11-16 DIAGNOSIS — Z79899 Other long term (current) drug therapy: Secondary | ICD-10-CM

## 2011-11-16 LAB — RENAL FUNCTION PANEL
Albumin: 4 g/dL (ref 3.5–5.2)
BUN: 21 mg/dL (ref 6–23)
CO2: 26 mEq/L (ref 19–32)
Calcium: 9.2 mg/dL (ref 8.4–10.5)
Glucose, Bld: 124 mg/dL — ABNORMAL HIGH (ref 70–99)
Sodium: 142 mEq/L (ref 135–145)

## 2011-11-16 LAB — LIPID PANEL
HDL: 50 mg/dL (ref >39)
LDL Cholesterol: 61 mg/dL (ref 0–99)
Total CHOL/HDL Ratio: 2.5 Ratio
Triglycerides: 76 mg/dL (ref <150)
VLDL: 15 mg/dL (ref 0–40)

## 2011-11-16 LAB — POCT URINALYSIS DIPSTICK
Bilirubin, UA: NEGATIVE
Glucose, UA: NEGATIVE
Nitrite, UA: NEGATIVE
Spec Grav, UA: 1.02
pH, UA: 7

## 2011-11-16 LAB — POCT INR: INR: 2.1

## 2011-11-16 MED ORDER — METOPROLOL TARTRATE 100 MG PO TABS: 100.0000 mg | ORAL_TABLET | Freq: Two times a day (BID) | ORAL | Status: DC

## 2011-11-16 MED ORDER — TRAMADOL HCL 50 MG PO TABS: 50.0000 mg | ORAL_TABLET | Freq: Four times a day (QID) | ORAL | Status: DC

## 2011-11-16 MED ORDER — LISINOPRIL 40 MG PO TABS: 40.0000 mg | ORAL_TABLET | Freq: Every day | ORAL | Status: DC

## 2011-11-16 MED ORDER — SOLIFENACIN SUCCINATE 10 MG PO TABS: 10.0000 mg | ORAL_TABLET | Freq: Every day | ORAL | Status: DC

## 2011-11-16 NOTE — Patient Instructions (Addendum)
   Please follow-up at the clinic in 3 months, at which time we will reevaluate your pain, your urine troubles, and consider urology referral at that time - OR, please follow-up in the clinic sooner if needed.  There have not been changes in your medications.   You are getting labs today, if they are abnormal I will give you a call.   If you have been started on new medication(s), and you develop symptoms concerning for allergic reaction, including, but not limited to, throat closing, tongue swelling, rash, please stop the medication immediately and call the clinic at [phone], and go to the ER.  If symptoms worsen, or new symptoms arise, please call the clinic or go to the ER.  Please bring all of your medications in a bag to your next visit.

## 2011-11-16 NOTE — Progress Notes (Signed)
Subjective:    Patient ID: [name]   Gender: female   DOB: October 17, 1925   Age: 78 y.o.   MRN: [id number]  HPI: [name] is a 78 y.o. with a atrial fibrillation on coumadin, HTN, who presented to clinic today for the following:  1) DM, II - A1c 6.5 (05/2011) - Patient checking blood sugars 1 times daily, before breakfast. Reports fasting blood sugars of 240s mg/dL after she eats. Currently not on any medication. No hypoglyucemia < 70 mg/dL. denies polyuria, polydipsia, nausea, vomiting, diarrhea.  2) HTN - Patient does check blood pressure regularly at home - 130s/ 70 mmHg. Currently taking lisinopril 40mm Hg and metoprolol 100mg  BID. Patient misses doses 0 x per week on average. denies headaches, dizziness, lightheadedness, chest pain, shortness of breath.  does request refills today.   3) Urinary frequency - states she is having frequent urinary urgency, frequency 5-6 times a day. Has noticed this change worsening over last several months. Has not previously been evaluated by a urologist, although is chronically on VESIcare for this issue. This was escalated in dosage in the past several months. Despite escalation, there has not been significant improvement. Recently, the patient has noted increased darkness of color of the urine.    Review of Systems: Per HPI.   Current Outpatient Medications: Medication Sig  . lisinopril (PRINIVIL,ZESTRIL) 40 MG tablet Take 40 mg by mouth daily.  . metoprolol (LOPRESSOR) 100 MG tablet TAKE ONE (1) TABLET(S) TWICE DAILY  . traMADol (ULTRAM) 50 MG tablet Take 1 tablet (50 mg total) by mouth every 6 (six) hours. You have been started on a new medication that can cause drowsiness, do not drive or operate heavy machinery . Do not take this medication with alcohol.  . VESICARE 10 MG tablet TAKE ONE (1) TABLET(S) DAILY  . warfarin (COUMADIN) 5 MG tablet Take as directed by anticoagulation clinic provider.    Allergies: No Known  Allergies   Past Medical History  Diagnosis Date  . Weight loss, unintentional 2010-2011    8/08-8/10 138-146 lbs, between 8/10-10/11 lost 18 lbs., work-up negative  . Incontinence overflow, stress female     on vesicare  . History of sick sinus syndrome s/p pacemaker Dr. [name] 2000  . Atrial fibrillation     on coumadin followed by Dr. Alexandria Lodge  . History of vaginal bleeding 05/2005    nagative endometrial biopsy  . Restrictive lung disease 1996    PFT's showed mild disease  . Anemia     Iron deficiency, does not tolerate po  . CHF (congestive heart failure)     diastolic dysfunction  . Diabetes mellitus     type 2  diet controlled  . Hypertension     well controlled on 3 agents  . Osteoporosis   . Stroke 2002    R MCA, cardioembolic  . Allergic rhinitis   . Onychomycosis   . History of trichomonal urethritis   . Arthritis   . DJD (degenerative joint disease)   . Constipation   . Hx of hysterectomy     Past Surgical History  Procedure Date  . Abdominal hysterectomy   . Cholecystectomy   . Pacemaker insertion 2000, Dr. [name]  . Cataract extraction      Objective:    Physical Exam: Filed Vitals:   11/16/11 1134  BP: 131/63  Pulse: 67  Temp: 97.4 F (36.3 C)      General: Vital signs reviewed and noted. Well-developed, well-nourished, in no  acute distress; alert, appropriate and cooperative throughout examination.  Head: Normocephalic, atraumatic.   Lungs:  Normal respiratory effort. Clear to auscultation BL without crackles or wheezes.  Heart: Irregular rate, rhythm S1 and S2 normal without gallop, rubs. No murmur.  Abdomen:  BS normoactive. Soft, Nondistended, non-tender.  No masses or organomegaly.  Extremities: No pretibial edema.     Assessment/ Plan:   Case and plan of care discussed with Dr. Jonah Blue.

## 2011-11-17 ENCOUNTER — Encounter: Payer: Self-pay | Admitting: Internal Medicine

## 2011-11-17 DIAGNOSIS — N390 Urinary tract infection, site not specified: Secondary | ICD-10-CM | POA: Insufficient documentation

## 2011-11-17 LAB — URINALYSIS, ROUTINE W REFLEX MICROSCOPIC
Bilirubin Urine: NEGATIVE
Glucose, UA: NEGATIVE mg/dL
Protein, ur: NEGATIVE mg/dL
Specific Gravity, Urine: 1.012 (ref 1.005–1.030)
Urobilinogen, UA: 1 mg/dL (ref 0.0–1.0)

## 2011-11-17 LAB — URINALYSIS, MICROSCOPIC ONLY: Crystals: NONE SEEN

## 2011-11-17 MED ORDER — CEPHALEXIN 500 MG PO CAPS: 500.0000 mg | ORAL_CAPSULE | Freq: Two times a day (BID) | ORAL | Status: AC

## 2011-11-17 NOTE — Assessment & Plan Note (Signed)
Pertinent Labs: Lab Results  Component Value Date   HGBA1C 6.6 11/16/2011   HGBA1C 6.8* 10/17/2010   CREATININE 0.95 11/16/2011   CREATININE 0.87 10/18/2010   MICROALBUR 1.36 05/26/2009   MICRALBCREAT 13.9 05/26/2009   CHOL 126 11/16/2011   HDL 50 11/16/2011   TRIG 76 11/16/2011    Assessment: Disease Control: controlled  Progress toward goals: at goal  Barriers to meeting goals: no barriers identified    Patient has diet-controlled DMII, and continues to have excellent blood sugar control.     Plan: Glucometer log was not reviewed today, as pt did not have glucometer available for review.   Continue with diet control strategies for blood sugar control.  Foot exam today.

## 2011-11-17 NOTE — Assessment & Plan Note (Addendum)
Assessment: Disease Control: not controlled  Progress toward goals: unchanged  Barriers to meeting goals: no barriers identified    Patient has had these issues over the past several years. She states she has not previously been evaluated by a urologist. It's unclear she's previously attempted Kegel exercises.  Has noted recent worsening of symptoms.  She drinks a lot of caffeine and other drinks daily.  Plan:      continue current medications  Will provide handout on Kegel exercises and bladder training - will have to mail to patient.  Will consider sending to urologist if symptoms persistent during next visit.  Check urinalysis and urine culture at this time.  ADDENDUM AFTER LABS RESULTED:  UA consistent with UTI.   Will treat with Keflex x 7days (avoiding bactrim and cipro because increased interaction with coumadin effect). Will send in electronically to her pharmacy.  Will call patient to inform.

## 2011-11-17 NOTE — Assessment & Plan Note (Addendum)
Pertinent Data: BP Readings from Last 3 Encounters:  11/16/11 131/63  08/08/11 133/57  07/19/11 127/62    Basic Metabolic Panel:    Component Value Date/Time   NA 142 11/16/2011 1227   K 3.9 11/16/2011 1227   CL 108 11/16/2011 1227   CO2 26 11/16/2011 1227   BUN 21 11/16/2011 1227   CREATININE 0.95 11/16/2011 1227   CREATININE 0.87 10/18/2010 0600   GLUCOSE 124* 11/16/2011 1227   CALCIUM 9.2 11/16/2011 1227    Assessment: Disease Control: controlled  Progress toward goals: at goal  Barriers to meeting goals: no barriers identified    Patient is compliant most of the time with prescribed medications.   Plan:  continue current medications  Renal function panel.

## 2011-11-18 ENCOUNTER — Encounter: Payer: Self-pay | Admitting: Internal Medicine

## 2011-11-18 LAB — URINE CULTURE

## 2011-12-21 ENCOUNTER — Ambulatory Visit (INDEPENDENT_AMBULATORY_CARE_PROVIDER_SITE_OTHER): Payer: Medicare HMO | Admitting: *Deleted

## 2011-12-21 ENCOUNTER — Encounter: Payer: Self-pay | Admitting: Internal Medicine

## 2011-12-21 DIAGNOSIS — I4891 Unspecified atrial fibrillation: Secondary | ICD-10-CM

## 2011-12-21 LAB — PACEMAKER DEVICE OBSERVATION
BATTERY VOLTAGE: 2.7 V
BMOD-0003RV: 30
BMOD-0004RV: 5
RV LEAD AMPLITUDE: 11.2 mv
RV LEAD IMPEDENCE PM: 627 Ohm

## 2011-12-21 NOTE — Progress Notes (Signed)
PPM check 

## 2011-12-26 ENCOUNTER — Ambulatory Visit: Payer: Medicare HMO

## 2012-01-05 ENCOUNTER — Encounter: Payer: Self-pay | Admitting: Internal Medicine

## 2012-01-05 ENCOUNTER — Ambulatory Visit (INDEPENDENT_AMBULATORY_CARE_PROVIDER_SITE_OTHER): Payer: Medicare HMO | Admitting: Internal Medicine

## 2012-01-05 VITALS — BP 130/68 | HR 70 | Temp 98.2°F | Ht 60.0 in | Wt 124.9 lb

## 2012-01-05 DIAGNOSIS — D509 Iron deficiency anemia, unspecified: Secondary | ICD-10-CM

## 2012-01-05 DIAGNOSIS — E119 Type 2 diabetes mellitus without complications: Secondary | ICD-10-CM

## 2012-01-05 DIAGNOSIS — E538 Deficiency of other specified B group vitamins: Secondary | ICD-10-CM

## 2012-01-05 DIAGNOSIS — N189 Chronic kidney disease, unspecified: Secondary | ICD-10-CM

## 2012-01-05 DIAGNOSIS — IMO0002 Reserved for concepts with insufficient information to code with codable children: Secondary | ICD-10-CM

## 2012-01-05 DIAGNOSIS — Z79899 Other long term (current) drug therapy: Secondary | ICD-10-CM

## 2012-01-05 DIAGNOSIS — I639 Cerebral infarction, unspecified: Secondary | ICD-10-CM

## 2012-01-05 DIAGNOSIS — S0501XA Injury of conjunctiva and corneal abrasion without foreign body, right eye, initial encounter: Secondary | ICD-10-CM | POA: Insufficient documentation

## 2012-01-05 DIAGNOSIS — S058X9A Other injuries of unspecified eye and orbit, initial encounter: Secondary | ICD-10-CM

## 2012-01-05 LAB — GLUCOSE, CAPILLARY: Glucose-Capillary: 130 mg/dL — ABNORMAL HIGH (ref 70–99)

## 2012-01-05 LAB — POCT GLYCOSYLATED HEMOGLOBIN (HGB A1C): Hemoglobin A1C: 6.5

## 2012-01-05 MED ORDER — ASPIRIN 81 MG PO TABS: 81.0000 mg | ORAL_TABLET | Freq: Every day | ORAL | Status: DC

## 2012-01-05 NOTE — Patient Instructions (Addendum)
Please return to the clinic in 1 week if your eye is getting worse or pain worsens.  Please follow-up at the clinic in 3 months, at which time we will reevaluate your blood pressure, diabetes - OR, please follow-up in the clinic sooner if needed.  There have not been changes in your medications.    If you have been started on new medication(s), and you develop symptoms concerning for allergic reaction, including, but not limited to, throat closing, tongue swelling, rash, please stop the medication immediately and call the clinic at [phone], and go to the ER.  If you are diabetic, please bring your meter to your next visit.  If symptoms worsen, or new symptoms arise, please call the clinic or go to the ER.  Please bring all of your medications in a bag to your next visit.   Corneal Abrasion The cornea is the clear covering at the front and center of the eye. When looking at the colored portion (iris) of the eye, you are looking through that person's cornea.   This very thin tissue is made up of many layers. The surface layer is a single layer of cells called the corneal epithelium. This is one of the most sensitive tissues in the body. If a scratch or injury causes the corneal epithelium to come off, it is called a corneal abrasion. If the injury extends to the tissues below the epithelium, the condition is called a corneal ulcer.   CAUSES    Scratches.   Trauma.   Foreign body in the eye.   Some people have recurrences of abrasions in the area of the original injury even after they heal. This is called recurrent erosion syndrome. Recurrent erosion syndromes generally improve and go away with time.  SYMPTOMS    Eye pain.   Difficulty or inability to keep the injured eye open.   The eye becomes very sensitive to light.   Recurrent erosions tend to happen suddenly, first thing in the morning - usually upon awakening and opening the eyes.  DIAGNOSIS   Your eye professional can  diagnose a corneal abrasion during an eye exam. Dye is usually placed in the eye using a drop or a small paper strip moistened by the patient's tears. When the eye is examined with a special light, the abrasion shows up clearly because of the dye. TREATMENT    Most corneal abrasions heal within 2-3 days with no effect on vision. WARNING: Do not drive or operate machinery while your eye is patched. Your ability to judge distances is impaired.   If abrasion becomes infected and spreads to the deeper tissues of the cornea, a corneal ulcer can result. This is serious because it can cause corneal scarring. Corneal scars interfere with light passing through the cornea, and cause a loss of vision in the involved eye.   If your caregiver has given you a follow-up appointment, it is very important to keep that appointment. Not keeping the appointment could result in a severe eye infection or permanent loss of vision. If there is any problem keeping the appointment, you must call back to this facility for assistance.  SEEK MEDICAL CARE IF:    You have pain, light sensitivity and a scratchy feeling in one eye (or both).   Your pressure patch keeps loosening up and you can blink your eye under the patch after treatment.   Any kind of discharge develops from the involved eye after treatment or if the lids stick  together in the morning.   You have the same symptoms in the morning as you did with the original abrasion days, weeks or months after the abrasion healed.  MAKE SURE YOU:    Understand these instructions.   Will watch your condition.   Will get help right away if you are not doing well or get worse.  Document Released: 04/01/2000 Document Revised: 03/24/2011 Document Reviewed: 11/08/2007 [facility] Patient Information 2012 [place], [place].

## 2012-01-05 NOTE — Assessment & Plan Note (Signed)
Assessment: Patient likely sustained a corneal abrasion secondary to the fish scale that struck her eye last night. There is no evidence of ulceration, or ongoing infection. She is not having any vision loss or extreme pain to suggest an open-angle glaucoma or other such.  Plan:      Conservative management, anticipate improvement in next few days.  Advised patient to call on Monday (4 days from now) if symptoms do not improve or new symptoms arise.

## 2012-01-05 NOTE — Assessment & Plan Note (Signed)
Pertinent Labs: Lab Results  Component Value Date   HGBA1C 6.5 01/05/2012   HGBA1C 6.8* 10/17/2010   CREATININE 0.95 11/16/2011   CREATININE 0.87 10/18/2010   MICROALBUR 1.36 05/26/2009   MICRALBCREAT 13.9 05/26/2009   CHOL 126 11/16/2011   HDL 50 11/16/2011   TRIG 76 11/16/2011    Assessment: Disease Control: controlled  Progress toward goals: at goal  Barriers to meeting goals: no barriers identified  Microvascular complications: nephropathy and retinopathy  Macrovascular complications: cerebrovascular disease  On aspirin: No    On statin: No - but is at goal with LDL of 61 mg/dL  On ACE-I/ ARB: Yes     Plan: Glucometer log was not reviewed today, as pt did not have glucometer available for review.   Continue routine monitoring.  reminded to get eye exam - referral to be placed today.  No need to initiate diabetic therapy today.  Start aspirin 81 mg daily.

## 2012-01-05 NOTE — Assessment & Plan Note (Deleted)
Assessment: Followed at [facility]. Is currently not currently on phosphate binder, iron supplementation, Epo.  Plan:      Will check renal function panel today.

## 2012-01-05 NOTE — Progress Notes (Signed)
Dispose of 1 bottle label Furosemide in container (in medication room) per Dr Saralyn Pilar

## 2012-01-05 NOTE — Progress Notes (Signed)
Subjective:    Patient: [name]   Age: 78 y.o.   Gender: female   MRN: [id number]  DOB: 07/03/1925     HPI: [name] is a 77 y.o. with a PMHx of atrial fibrillation on coumadin, HTN, who presented to clinic today for the following:  1) DM2, controlled  Lab Results  Component Value Date   HGBA1C 6.6 11/16/2011   Patient checking blood sugars 1 times daily, before breakfast. Reports fasting blood sugars of 130-150 mg/dL. Currently not on any medications for her diabetes. 0 hypoglycemic episodes since last visit. denies polyuria, polydipsia, nausea, vomiting, diarrhea.  In regards to diabetic complications:  Microvascular complications: Confirms: nephropathy and retinopathy ; Denies peripheral neuropathy.  Macrovascular complications: Confirms: cerebrovascular disease ; Denies cardiovascular disease and peripheral vascular disease.   Important diabetic medications: Is patient on aspirin? No Is patient on a statin? No - but is at goal LDL less than 100. Is patient on an ACE-I/ ARB? Yes   2) HTN - Currently taking lisinopril 40mm Hg and metoprolol 100mg  BID. States she takes medications only every other day. denies headaches, dizziness, lightheadedness, chest pain, shortness of breath.  Does not request refills today.   3) Urinary frequency - previously states she was having frequent urination with urgency, she was treated for a UTI. This has resolved. Now still having nocturia, not bothersome. No fevers, chills, dysuria, hematuria.  4) Preventative medicine - due for all shots including pneumococcal vaccination, flu shot, Tdap, Zostavax. Due for eye exam. Refuses all.  5) Eye injury - states fish scale struck her eye, hurts on lateral edge of sclera, no eye discharge, swelling.    Review of Systems: Per HPI.   Current Outpatient Medications: Medication Sig  . lisinopril (PRINIVIL,ZESTRIL) 40 MG tablet Take 1 tablet (40 mg total) by mouth daily.  . metoprolol  (LOPRESSOR) 100 MG tablet Take 1 tablet (100 mg total) by mouth 2 (two) times daily.  . solifenacin (VESICARE) 10 MG tablet Take 1 tablet (10 mg total) by mouth daily.  . traMADol (ULTRAM) 50 MG tablet Take 1 tablet (50 mg total) by mouth every 6 (six) hours. This med can cause drowsiness, do not drive or operate heavy machinery, or take with alcohol.  . warfarin (COUMADIN) 5 MG tablet Take as directed by anticoagulation clinic provider.     No Known Allergies  Past Medical History  Diagnosis Date  . Weight loss, unintentional 2010-2011    8/08-8/10 138-146 lbs, between 8/10-10/11 lost 18 lbs., work-up negative  . Incontinence overflow, stress female     on vesicare  . History of sick sinus syndrome s/p pacemaker Dr. [name] 2000  . Atrial fibrillation     on coumadin followed by Dr. Alexandria Lodge  . History of vaginal bleeding 05/2005    nagative endometrial biopsy  . Restrictive lung disease 1996    PFT's showed mild disease  . Anemia     Iron deficiency, does not tolerate po  . CHF (congestive heart failure)     diastolic dysfunction  . Diabetes mellitus     type 2  diet controlled  . Hypertension     well controlled on 3 agents  . Osteoporosis   . Stroke 2002    R MCA, cardioembolic  . Allergic rhinitis   . Onychomycosis   . History of trichomonal urethritis   . Arthritis   . DJD (degenerative joint disease)   . Constipation   . Hx of hysterectomy  Past Surgical History  Procedure Date  . Abdominal hysterectomy   . Cholecystectomy   . Pacemaker insertion 2000, Dr. [name]  . Cataract extraction      Objective:    Physical Exam: Filed Vitals:   01/05/12 1447  BP: 130/68  Pulse: 70  Temp: 98.2 F (36.8 C)     General: Vital signs reviewed and noted. Well-developed, well-nourished, in no acute distress; alert, appropriate and cooperative throughout examination.  Head: Normocephalic, atraumatic.   Eyes: Anicteric, scleral injection in right lateral region of  eye. No obvious ulceration, no discharge, and no surrounding swelling.  Lungs:  Normal respiratory effort. Clear to auscultation BL without crackles or wheezes.  Heart: Irregular rate, rhythm S1 and S2 normal without gallop, rubs. No murmur.  Abdomen:  BS normoactive. Soft, Nondistended, non-tender.  No masses or organomegaly.  Extremities: No pretibial edema.     Assessment/ Plan:   Case and plan of care discussed with Dr. Blanch Media.

## 2012-01-05 NOTE — Assessment & Plan Note (Addendum)
Pertinent Labs: CBC:    Component Value Date/Time   WBC 3.7* 01/11/2011 0939   HGB 10.3* 01/11/2011 0939   HCT 31.7* 01/11/2011 0939   PLT 149* 01/11/2011 0939   MCV 85.9 01/11/2011 0939   NEUTROABS 2.6 10/16/2010 1955   LYMPHSABS 3.1 10/16/2010 1955   MONOABS 0.7 10/16/2010 1955   EOSABS 0.1 10/16/2010 1955   BASOSABS 0.0 10/16/2010 1955    Lab Results  Component Value Date   IRON 24* 10/17/2010   TIBC 334 10/17/2010   FERRITIN 18 10/17/2010    Assessment: History of iron-deficiency anemia of unclear cause, possibly low dietary iron consumption. She does have a history of colonic polyps per colonoscopy in 2011. No post-menopausal bleeding. She has been previously intolerant to oral iron supplementation.  Plan:      Recheck CBC.  Consider to check anemia panel next visit.

## 2012-01-06 LAB — CBC
Hemoglobin: 9.6 g/dL — ABNORMAL LOW (ref 12.0–15.0)
MCHC: 32.5 g/dL (ref 30.0–36.0)
MCV: 87.5 fL (ref 78.0–100.0)
RBC: 3.37 MIL/uL — ABNORMAL LOW (ref 3.87–5.11)
WBC: 5 10*3/uL (ref 4.0–10.5)

## 2012-02-20 ENCOUNTER — Encounter: Payer: Self-pay | Admitting: Internal Medicine

## 2012-02-20 NOTE — Progress Notes (Signed)
Patient ID: [name], female   DOB: Apr 16, 1926, 78 y.o.   MRN: [id number]  This patient is a CHRONIC NO-SHOW PATIENT that has a history of HYPERTENSION.  Please make sure to address hypertension during her next clinic appointment, and intervene as appropriate.    Within the AVS, please incorporate the following smartphrase: .htntips   Pertinent Data: BP Readings from Last 3 Encounters:  01/05/12 130/68  11/16/11 131/63  08/08/11 133/57    BMI: Estimated Body mass index is 24.39 kg/(m^2) as calculated from the following:   Height as of 01/05/12: 5\' 0" (1.524 m).   Weight as of 01/05/12: 124 lb 14.4 oz(56.654 kg).  Smoking History: History  Smoking status  . Former Smoker  Smokeless tobacco  . Not on file    Last Basic Metabolic Panel:    Component Value Date/Time   NA 142 11/16/2011 1227   K 3.9 11/16/2011 1227   CL 108 11/16/2011 1227   CO2 26 11/16/2011 1227   BUN 21 11/16/2011 1227   CREATININE 0.95 11/16/2011 1227   CREATININE 0.87 10/18/2010 0600   GLUCOSE 124* 11/16/2011 1227   CALCIUM 9.2 11/16/2011 1227    Allergies: No Known Allergies

## 2012-02-27 ENCOUNTER — Ambulatory Visit (INDEPENDENT_AMBULATORY_CARE_PROVIDER_SITE_OTHER): Payer: Medicare HMO | Admitting: Pharmacist

## 2012-02-27 DIAGNOSIS — Z7901 Long term (current) use of anticoagulants: Secondary | ICD-10-CM

## 2012-02-27 DIAGNOSIS — I4891 Unspecified atrial fibrillation: Secondary | ICD-10-CM

## 2012-02-27 LAB — POCT INR: INR: 2.2

## 2012-02-27 NOTE — Patient Instructions (Signed)
Patient instructed to take medications as defined in the Anti-coagulation Track section of this encounter.  Patient instructed to take today's dose.  Patient verbalized understanding of these instructions.    

## 2012-02-27 NOTE — Progress Notes (Signed)
Anti-Coagulation Progress Note  [name] is a 78 y.o. female who is currently on an anti-coagulation regimen.    RECENT RESULTS: Recent results are below, the most recent result is correlated with a dose of 37.5 mg. per week: Lab Results  Component Value Date   INR 2.2 02/27/2012   INR 2.1 11/16/2011   INR 4.2 10/31/2011    ANTI-COAG DOSE:   Latest dosing instructions   Total Sun Mon Tue Wed Thu Fri Sat   40 5 mg 7.5 mg 5 mg 5 mg 7.5 mg 5 mg 5 mg    (5 mg1) (5 mg1.5) (5 mg1) (5 mg1) (5 mg1.5) (5 mg1) (5 mg1)         ANTICOAG SUMMARY: Anticoagulation Episode Summary              Current INR goal 2.0-3.0 Next INR check 03/19/2012   INR from last check 2.2 (02/27/2012)     Weekly max dose (mg)  Target end date Indefinite   Indications Long term current use of anticoagulant [V58.61], Atrial fibrillation [427.31]   INR check location [facility] Preferred lab    Send INR reminders to ANTICOAG IMP   Comments        Provider Role Specialty Phone number   Zoila Shutter, MD  Internal Medicine [phone]        ANTICOAG TODAY: Anticoagulation Summary as of 02/27/2012              INR goal 2.0-3.0     Selected INR 2.2 (02/27/2012) Next INR check 03/19/2012   Weekly max dose (mg)  Target end date Indefinite   Indications Long term current use of anticoagulant [V58.61], Atrial fibrillation [427.31]    Anticoagulation Episode Summary              INR check location [facility] Preferred lab    Send INR reminders to ANTICOAG IMP   Comments        Provider Role Specialty Phone number   Zoila Shutter, MD  Internal Medicine [phone]        PATIENT INSTRUCTIONS: Patient Instructions  Patient instructed to take medications as defined in the Anti-coagulation Track section of this encounter.  Patient instructed to take today's dose.  Patient verbalized understanding of these instructions.        FOLLOW-UP Return in 3 weeks (on 03/19/2012)  for Follow up INR at 1030h.  [name], [name], CACP

## 2012-03-16 ENCOUNTER — Encounter: Payer: Self-pay | Admitting: *Deleted

## 2012-03-19 ENCOUNTER — Ambulatory Visit: Payer: Medicare HMO

## 2012-03-21 ENCOUNTER — Encounter: Payer: Self-pay | Admitting: Internal Medicine

## 2012-03-21 ENCOUNTER — Ambulatory Visit (INDEPENDENT_AMBULATORY_CARE_PROVIDER_SITE_OTHER): Payer: Medicare HMO | Admitting: *Deleted

## 2012-03-21 DIAGNOSIS — I4891 Unspecified atrial fibrillation: Secondary | ICD-10-CM

## 2012-03-21 LAB — PACEMAKER DEVICE OBSERVATION
BMOD-0003RV: 30
BRDY-0004RV: 120 {beats}/min
RV LEAD THRESHOLD: 1 V
VENTRICULAR PACING PM: 18.2

## 2012-03-21 NOTE — Patient Instructions (Addendum)
Return office visit 06/20/12 @9 :30am with the device clinic.

## 2012-03-21 NOTE — Progress Notes (Signed)
PPM check 

## 2012-04-03 ENCOUNTER — Encounter: Payer: Self-pay | Admitting: Internal Medicine

## 2012-04-03 NOTE — Progress Notes (Signed)
PANEL MANAGEMENT   [name]  is currently a chronic no-show patient, with last clinic visit on 01/05/2012, for a(n) acute visit. She has multiple chronic medical problems that have been updated to reflect current status.    These chronic medical issues are as listed below:  Past Medical History  Diagnosis Date  . Weight loss, unintentional 2010-2011    8/08-8/10 138-146 lbs, between 8/10-10/11 lost 18 lbs., work-up negative  . Incontinence overflow, stress female     on vesicare  . History of sick sinus syndrome s/p pacemaker Dr. [name] 2000  . Atrial fibrillation     on coumadin followed by Dr. Alexandria Lodge  . History of vaginal bleeding 05/2005    nagative endometrial biopsy  . Restrictive lung disease 1996    PFT's showed mild disease  . Anemia     Iron deficiency, does not tolerate po  . CHF (congestive heart failure)     diastolic dysfunction  . Diabetes mellitus     type 2  diet controlled  . Hypertension     well controlled on 3 agents  . Osteoporosis   . Stroke 2002    R MCA, cardioembolic  . Allergic rhinitis   . Onychomycosis   . History of trichomonal urethritis   . Arthritis   . DJD (degenerative joint disease)   . Constipation   . Hx of hysterectomy      I spoke with the patient's pharmacy [facility] [address]), who indicate that her refills history is as below:  Medication  Last Refill Being Appropriately Refilled?  Lisinopril 03/16/2012 01/22/2012 12/18/2011 Yes   Metoprolol 03/16/2012 01/22/2012 11/16/2011 Yes - now is filling regularly, but did not fill in 11/2011  Vesicare 03/16/2012 01/22/2012 12/18/2011 Yes   Tramadol 11/16/2011 Yes - completed course, therefore, will remove from med list  Coumadin 03/16/2012 01/22/2012 10/22/2011 Yes - now is filling regularly, but did not fill in 11/2011     The patient does currently have scheduled follow-up at [facility]. Therefore, does not need to be called to request appointment. If  appointment is needed, a message has been sent to front desk to request an appointment.   Johnette Abraham, DO 04/03/2012, 4:29 PM

## 2012-04-09 ENCOUNTER — Encounter: Payer: Self-pay | Admitting: Internal Medicine

## 2012-04-09 ENCOUNTER — Other Ambulatory Visit: Payer: Self-pay | Admitting: Internal Medicine

## 2012-04-09 ENCOUNTER — Ambulatory Visit (INDEPENDENT_AMBULATORY_CARE_PROVIDER_SITE_OTHER): Payer: Medicare HMO | Admitting: Internal Medicine

## 2012-04-09 VITALS — BP 137/70 | HR 66 | Temp 98.2°F | Wt 128.6 lb

## 2012-04-09 DIAGNOSIS — R300 Dysuria: Secondary | ICD-10-CM

## 2012-04-09 DIAGNOSIS — I10 Essential (primary) hypertension: Secondary | ICD-10-CM

## 2012-04-09 DIAGNOSIS — E119 Type 2 diabetes mellitus without complications: Secondary | ICD-10-CM

## 2012-04-09 DIAGNOSIS — M949 Disorder of cartilage, unspecified: Secondary | ICD-10-CM

## 2012-04-09 DIAGNOSIS — M542 Cervicalgia: Secondary | ICD-10-CM

## 2012-04-09 DIAGNOSIS — N390 Urinary tract infection, site not specified: Secondary | ICD-10-CM

## 2012-04-09 DIAGNOSIS — E118 Type 2 diabetes mellitus with unspecified complications: Secondary | ICD-10-CM

## 2012-04-09 DIAGNOSIS — M899 Disorder of bone, unspecified: Secondary | ICD-10-CM

## 2012-04-09 DIAGNOSIS — Z79899 Other long term (current) drug therapy: Secondary | ICD-10-CM

## 2012-04-09 DIAGNOSIS — M810 Age-related osteoporosis without current pathological fracture: Secondary | ICD-10-CM

## 2012-04-09 DIAGNOSIS — M8580 Other specified disorders of bone density and structure, unspecified site: Secondary | ICD-10-CM

## 2012-04-09 DIAGNOSIS — H9190 Unspecified hearing loss, unspecified ear: Secondary | ICD-10-CM

## 2012-04-09 LAB — URINALYSIS, ROUTINE W REFLEX MICROSCOPIC
Bilirubin Urine: NEGATIVE
Glucose, UA: NEGATIVE mg/dL
Specific Gravity, Urine: 1.012 (ref 1.005–1.030)
Urobilinogen, UA: 1 mg/dL (ref 0.0–1.0)
pH: 6 (ref 5.0–8.0)

## 2012-04-09 LAB — POCT URINALYSIS DIPSTICK
Bilirubin, UA: NEGATIVE
Ketones, UA: NEGATIVE

## 2012-04-09 LAB — URINALYSIS, MICROSCOPIC ONLY

## 2012-04-09 MED ORDER — FOSFOMYCIN TROMETHAMINE 3 G PO PACK: 3.0000 g | PACK | Freq: Once | ORAL | Status: DC

## 2012-04-09 MED ORDER — VITAMIN D 400 UNITS PO TABS: 800.0000 [IU] | ORAL_TABLET | Freq: Every day | ORAL | Status: DC

## 2012-04-09 MED ORDER — CALCIUM 600 MG PO TABS: 1200.0000 mg | ORAL_TABLET | Freq: Every day | ORAL | Status: DC

## 2012-04-09 MED ORDER — TRAMADOL HCL 50 MG PO TABS: 50.0000 mg | ORAL_TABLET | Freq: Four times a day (QID) | ORAL | Status: DC | PRN

## 2012-04-09 NOTE — Assessment & Plan Note (Signed)
Pertinent Data: Urinalysis    Component Value Date/Time   COLORURINE YELLOW 04/09/2012 1114   APPEARANCEUR TURBID* 04/09/2012 1114   LABSPEC 1.012 04/09/2012 1114   PHURINE 6.0 04/09/2012 1114   GLUCOSEU NEG 04/09/2012 1114   HGBUR TRACE* 04/09/2012 1114   BILIRUBINUR NEG 04/09/2012 1114   BILIRUBINUR negative 04/09/2012 1038   KETONESUR NEG 04/09/2012 1114   PROTEINUR NEG 04/09/2012 1114   UROBILINOGEN 1 04/09/2012 1114   UROBILINOGEN 0.2 04/09/2012 1038   NITRITE NEG 04/09/2012 1114   NITRITE negative 04/09/2012 1038   LEUKOCYTESUR LARGE* 04/09/2012 1114    Assessment: Patient complains of dysuria x 1 month, UA is a dirty specimen, but does show LE. Will empirically treat. Treatment options limited in setting of CKD and chronic coumadin.  Plan:      Fosfomycin 3g x 1.

## 2012-04-09 NOTE — Assessment & Plan Note (Signed)
Pertinent Data: DEXA (04/2004)   LUMBAR SPINE (L1-L4) BMD 0.827     T score (% of young adult value): -2.9     Z score(% adult age match value): -0.2   HIP(NECK) - BMD 0.717    T Score (% of young adult value): -1.6    Z Score (% adult age match value): -0.0  Assessment: Patient has not had recurrent evaluation since last DEXA scan. Needs to be on Ca-Vitamin D supplementation at a minimum.  Plan:      Order DEXA scan.  Will ask RN to call pt and inform to start Ca/ vitamin D.

## 2012-04-09 NOTE — Patient Instructions (Signed)
General Instructions:  Please follow-up at the clinic in 3 months, at which time we will reevaluate your neck pain, diabetes, blood pressure - OR, please follow-up in the clinic sooner if needed.  There have been changes in your medications:  REFILL of tramadol is sent to your pharmacy - You have been started on a new medication that can cause drowsiness, do not drive or operate heavy machinery . Do not take this medication with alcohol.      Please followup with physical therapy to help with the neck pain.  Please get the hearing test performed.   If you have been started on new medication(s), and you develop symptoms concerning for allergic reaction, including, but not limited to, throat closing, tongue swelling, rash, please stop the medication immediately and call the clinic at [phone], and go to the ER.  If you are diabetic, please bring your meter to your next visit.  If symptoms worsen, or new symptoms arise, please call the clinic or go to the ER.   PLEASE BRING ALL OF YOUR MEDICATIONS  IN A BAG TO YOUR NEXT APPOINTMENT   Treatment Goals:  Goals (1 Years of Data) as of 04/09/2012          As of Today 01/05/12 11/16/11 08/08/11 07/19/11     Blood Pressure    . Blood Pressure < 140/90  137/70 130/68 131/63 133/57 127/62     Diet    . Have 3 meals a day           Result Component    . HEMOGLOBIN A1C < 7.0  6.4 6.5 6.6      . LDL CALC < 100    61        Progress Toward Treatment Goals:  Treatment Goal 04/09/2012  Hemoglobin A1C at goal  Blood pressure at goal    Self Care Goals & Plans:    Home Blood Glucose Monitoring 04/09/2012  Check my blood sugar once a day  When to check my blood sugar before breakfast     Care Management & Community Referrals:

## 2012-04-09 NOTE — Assessment & Plan Note (Addendum)
Pertinent Data: BP Readings from Last 3 Encounters:  04/09/12 137/70  01/05/12 130/68  11/16/11 131/63    Basic Metabolic Panel:    Component Value Date/Time   NA 142 11/16/2011 1227   K 3.9 11/16/2011 1227   CL 108 11/16/2011 1227   CO2 26 11/16/2011 1227   BUN 21 11/16/2011 1227   CREATININE 0.95 11/16/2011 1227   CREATININE 0.87 10/18/2010 0600   GLUCOSE 124* 11/16/2011 1227   CALCIUM 9.2 11/16/2011 1227    Assessment: Disease Control: controlled  Progress toward goals: at goal  Barriers to meeting goals: no barriers identified    Patient is compliant all of the time with prescribed medications.   Plan:  continue current medications  PLAN REPEAT BMET NEXT VISIT.  Educational resources provided: brochure  Self management tools provided: home blood pressure logbook

## 2012-04-09 NOTE — Assessment & Plan Note (Signed)
Pertinent Labs: Lab Results  Component Value Date   HGBA1C 6.5 01/05/2012   HGBA1C 6.8* 10/17/2010   CREATININE 0.95 11/16/2011   CREATININE 0.87 10/18/2010   MICROALBUR 1.36 05/26/2009   MICRALBCREAT 13.9 05/26/2009   CHOL 126 11/16/2011   HDL 50 11/16/2011   TRIG 76 11/16/2011    Assessment: Disease Control: controlled  Progress toward goals: at goal  Barriers to meeting goals: no barriers identified  Microvascular complications: nephropathy and retinopathy  Macrovascular complications: cerebrovascular disease  On aspirin: Yes   On statin: No - but is at goal with LDL of 61 mg/dL  On ACE-I/ ARB: Yes     Plan: Glucometer log was not reviewed today, as pt did not have glucometer available for review.   Continue routine monitoring.  reminded to get eye exam - referral to be placed today.  No need to initiate diabetic therapy today.

## 2012-04-09 NOTE — Addendum Note (Signed)
Addended by: Maura Crandall on: 04/09/2012 01:59 PM   Modules accepted: Orders

## 2012-04-09 NOTE — Assessment & Plan Note (Signed)
Pertinent Data:  Cervical Spine XR (06/2011) - Disc space flattening with mild anterior spurring at C3-C4 and C5- C6 level. No prevertebral soft tissue swelling. Stable minimal anterior spurring lower endplate of the C2 vertebral body. Again noted narrowing of the right neural foramina at C5-C6 level. Cervical airway is patent. C1-C2 relationship is unremarkable   Assessment:  Likely continues to be a muscular and skeletal component secondary to the degenerative changes that have been noted to be stable between 03/2009 and 06/2011 cervical XR.  CKD and chronic coumadin limits some interventions - avoiding NSAIDs and high dose tylenol in this setting.   No new neurologic deficits noted on exam.   Symptoms stable with tramadol.  Plan:      Continue cold to affected areas as tolerated - she finds this more effective than heat.  Physical therapy referral.  Can continue PRN tramadol for severe pain - patient has been under utilizing - using only once a day. Patient informed of the red flag symptoms that should prompt her immediate return for reevaluation - such as, but not limited to the following: new neurologic deficits, paresthesias, weakness of extremities. She expresses understanding of the information provided.

## 2012-04-09 NOTE — Progress Notes (Signed)
Patient: [name]   MRN: [id number]  DOB: 1925/05/07  PCP: Elyn Peers, DO   Subjective:    HPI: [name] is a 78 y.o. female with a PMHx of atrial fibrillation on coumadin, history of sick sinus syndrome status post pacemaker placement, diastolic CHF, remote history of stroke involving the right MCA, chronic left neck pain, and HTN who presented to clinic today for the following:  1) DM2, controlled -  Lab Results  Component Value Date   HGBA1C 6.5 01/05/2012   Patient checking blood sugars 1 times daily, before breakfast. Reports fasting blood sugars of 60-120 mg/dL. The hypoglycemic episodes are symptomatic with shakiness. Currently not on any medications for her diabetes. 1 hypoglycemic episodes since last visit. denies polyuria, polydipsia, nausea, vomiting, diarrhea.  In regards to diabetic complications:  Microvascular complications: Confirms: nephropathy and retinopathy ; Denies peripheral neuropathy.  Macrovascular complications: Confirms: cerebrovascular disease ; Denies cardiovascular disease and peripheral vascular disease.   Important diabetic medications: Is patient on aspirin? Yes Is patient on a statin? No - but is at goal LDL less than 100. Is patient on an ACE-I/ ARB? Yes   2) HTN - Currently taking lisinopril 40mg  and metoprolol 100mg  BID. States she takes medications only every other day. denies headaches, dizziness, lightheadedness, chest pain, shortness of breath.  Does not request refills today.   3) Preventative medicine - due for all shots including pneumococcal vaccination, flu shot, Tdap, Zostavax. Due for eye exam - wants to make her own appt.  4) Urinary complaint - Patient describes a 1 month history of dysuria. Otherwise, the patient denies abdominal pain, back pain, chills and hematuria, fevers.  5) Neck pain - chronic neck pain 2/2 known cervical degenerative arthritis. Pain is unchanged, continues to be worse with  activity, difficult to pull out of bed. No radiation of the pain.  Symptoms have been getting progressively worse, has been intermittent since that time. There is no numbness, tingling, weakness in the arms.   Review of Systems: Per HPI.   Current Outpatient Medications: Medication Sig  . aspirin 81 MG tablet Take 1 tablet (81 mg total) by mouth daily.  Marland Kitchen lisinopril (PRINIVIL,ZESTRIL) 40 MG tablet Take 1 tablet (40 mg total) by mouth daily.  . metoprolol (LOPRESSOR) 100 MG tablet Take 1 tablet (100 mg total) by mouth 2 (two) times daily.  . solifenacin (VESICARE) 10 MG tablet Take 1 tablet (10 mg total) by mouth daily.  Marland Kitchen warfarin (COUMADIN) 5 MG tablet Take as directed by anticoagulation clinic provider.    Allergies: No Known Allergies   Past Medical History  Diagnosis Date  . Weight loss, unintentional 2010-2011    8/08-8/10 138-146 lbs, between 8/10-10/11 lost 18 lbs., work-up negative  . Incontinence overflow, stress female     on vesicare  . History of sick sinus syndrome s/p pacemaker Dr. [name] 2000  . Atrial fibrillation     on coumadin followed by Dr. Alexandria Lodge  . History of vaginal bleeding 05/2005    nagative endometrial biopsy  . Restrictive lung disease 1996    PFT's showed mild disease  . Anemia     Iron deficiency, does not tolerate po  . CHF (congestive heart failure)     diastolic dysfunction  . Diabetes mellitus     type 2  diet controlled  . Hypertension     well controlled on 3 agents  . Osteoporosis   . Stroke 2002    R MCA, cardioembolic  .  Allergic rhinitis   . Onychomycosis   . History of trichomonal urethritis   . Arthritis   . DJD (degenerative joint disease)   . Constipation   . Hx of hysterectomy     Past Surgical History  Procedure Date  . Abdominal hysterectomy   . Cholecystectomy   . Pacemaker insertion 2000, Dr. [name]  . Cataract extraction      Objective:    Physical Exam: Filed Vitals:   04/09/12 1015  BP: 137/70    Pulse: 66  Temp: 98.2 F (36.8 C)     General: Vital signs reviewed and noted. Well-developed, well-nourished, in no acute distress; alert, appropriate and cooperative throughout examination.  Head: Normocephalic, atraumatic. Tenderness to palpation over her left posterior aspect of SCM muscle and trapezius musculature. No central tenderness to palpation over cervical spine. Full range of motion.  Lungs:  Normal respiratory effort. Clear to auscultation BL without crackles or wheezes.  Heart: Irregular rate, rhythm S1 and S2 normal without gallop, rubs. (+) murmur.  Abdomen:  BS normoactive. Soft, Nondistended, non-tender.  No masses or organomegaly.  Extremities: No pretibial edema. UE muscle strength 5/5 BL. Grip strength 5/5 BL. Sensations     Assessment/ Plan:   Case and plan of care discussed with attending physician, Dr. Margarito Liner.

## 2012-04-10 LAB — URINE CULTURE: Colony Count: 60000

## 2012-05-08 ENCOUNTER — Ambulatory Visit: Payer: Medicare HMO | Attending: Internal Medicine | Admitting: Audiology

## 2012-05-14 ENCOUNTER — Inpatient Hospital Stay (HOSPITAL_COMMUNITY)
Admission: EM | Admit: 2012-05-14 | Discharge: 2012-05-16 | DRG: 812 | Disposition: A | Discharge status: Home Health | Payer: Medicare HMO | Attending: Internal Medicine | Admitting: Internal Medicine

## 2012-05-14 ENCOUNTER — Inpatient Hospital Stay (HOSPITAL_COMMUNITY): Payer: Medicare HMO

## 2012-05-14 ENCOUNTER — Encounter (HOSPITAL_COMMUNITY): Payer: Self-pay | Admitting: *Deleted

## 2012-05-14 ENCOUNTER — Emergency Department (HOSPITAL_COMMUNITY): Payer: Medicare HMO

## 2012-05-14 DIAGNOSIS — I10 Essential (primary) hypertension: Secondary | ICD-10-CM | POA: Diagnosis present

## 2012-05-14 DIAGNOSIS — Z79899 Other long term (current) drug therapy: Secondary | ICD-10-CM

## 2012-05-14 DIAGNOSIS — I369 Nonrheumatic tricuspid valve disorder, unspecified: Secondary | ICD-10-CM

## 2012-05-14 DIAGNOSIS — K5730 Diverticulosis of large intestine without perforation or abscess without bleeding: Secondary | ICD-10-CM | POA: Diagnosis present

## 2012-05-14 DIAGNOSIS — R791 Abnormal coagulation profile: Secondary | ICD-10-CM

## 2012-05-14 DIAGNOSIS — K922 Gastrointestinal hemorrhage, unspecified: Secondary | ICD-10-CM | POA: Diagnosis present

## 2012-05-14 DIAGNOSIS — M1990 Unspecified osteoarthritis, unspecified site: Secondary | ICD-10-CM | POA: Diagnosis present

## 2012-05-14 DIAGNOSIS — I4891 Unspecified atrial fibrillation: Secondary | ICD-10-CM

## 2012-05-14 DIAGNOSIS — N393 Stress incontinence (female) (male): Secondary | ICD-10-CM | POA: Diagnosis present

## 2012-05-14 DIAGNOSIS — Z7901 Long term (current) use of anticoagulants: Secondary | ICD-10-CM

## 2012-05-14 DIAGNOSIS — I639 Cerebral infarction, unspecified: Secondary | ICD-10-CM

## 2012-05-14 DIAGNOSIS — R55 Syncope and collapse: Secondary | ICD-10-CM | POA: Diagnosis present

## 2012-05-14 DIAGNOSIS — I509 Heart failure, unspecified: Secondary | ICD-10-CM | POA: Diagnosis present

## 2012-05-14 DIAGNOSIS — D509 Iron deficiency anemia, unspecified: Principal | ICD-10-CM | POA: Diagnosis present

## 2012-05-14 DIAGNOSIS — Z8673 Personal history of transient ischemic attack (TIA), and cerebral infarction without residual deficits: Secondary | ICD-10-CM

## 2012-05-14 DIAGNOSIS — D649 Anemia, unspecified: Secondary | ICD-10-CM

## 2012-05-14 DIAGNOSIS — E119 Type 2 diabetes mellitus without complications: Secondary | ICD-10-CM | POA: Diagnosis present

## 2012-05-14 DIAGNOSIS — M810 Age-related osteoporosis without current pathological fracture: Secondary | ICD-10-CM | POA: Diagnosis present

## 2012-05-14 DIAGNOSIS — Z950 Presence of cardiac pacemaker: Secondary | ICD-10-CM

## 2012-05-14 DIAGNOSIS — Z8679 Personal history of other diseases of the circulatory system: Secondary | ICD-10-CM

## 2012-05-14 DIAGNOSIS — Z7982 Long term (current) use of aspirin: Secondary | ICD-10-CM

## 2012-05-14 DIAGNOSIS — I5032 Chronic diastolic (congestive) heart failure: Secondary | ICD-10-CM | POA: Diagnosis present

## 2012-05-14 HISTORY — DX: Presence of cardiac pacemaker: Z95.0

## 2012-05-14 LAB — ABO/RH: ABO/RH(D): O POS

## 2012-05-14 LAB — COMPREHENSIVE METABOLIC PANEL WITH GFR
ALT: 7 U/L (ref 0–35)
AST: 15 U/L (ref 0–37)
Albumin: 2.9 g/dL — ABNORMAL LOW (ref 3.5–5.2)
Alkaline Phosphatase: 65 U/L (ref 39–117)
BUN: 58 mg/dL — ABNORMAL HIGH (ref 6–23)
CO2: 19 meq/L (ref 19–32)
Calcium: 8.4 mg/dL (ref 8.4–10.5)
Chloride: 106 meq/L (ref 96–112)
Creatinine, Ser: 1.08 mg/dL (ref 0.50–1.10)
GFR calc Af Amer: 53 mL/min — ABNORMAL LOW (ref >90)
GFR calc non Af Amer: 46 mL/min — ABNORMAL LOW (ref >90)
Glucose, Bld: 225 mg/dL — ABNORMAL HIGH (ref 70–99)
Potassium: 4 meq/L (ref 3.5–5.1)
Sodium: 137 meq/L (ref 135–145)
Total Bilirubin: 0.4 mg/dL (ref 0.3–1.2)
Total Protein: 6.2 g/dL (ref 6.0–8.3)

## 2012-05-14 LAB — TROPONIN I: Troponin I: <0.3 ng/mL (ref <0.30)

## 2012-05-14 LAB — URINALYSIS, ROUTINE W REFLEX MICROSCOPIC
Bilirubin Urine: NEGATIVE
Glucose, UA: NEGATIVE mg/dL
Hgb urine dipstick: NEGATIVE
Ketones, ur: NEGATIVE mg/dL
Nitrite: NEGATIVE
Protein, ur: NEGATIVE mg/dL
Specific Gravity, Urine: 1.014 (ref 1.005–1.030)
Urobilinogen, UA: 0.2 mg/dL (ref 0.0–1.0)
pH: 5.5 (ref 5.0–8.0)

## 2012-05-14 LAB — URINE MICROSCOPIC-ADD ON

## 2012-05-14 LAB — RETICULOCYTES
RBC.: 1.48 MIL/uL — ABNORMAL LOW (ref 3.87–5.11)
Retic Count, Absolute: 57.7 10*3/uL (ref 19.0–186.0)
Retic Ct Pct: 3.9 % — ABNORMAL HIGH (ref 0.4–3.1)

## 2012-05-14 LAB — CBC WITH DIFFERENTIAL/PLATELET
Eosinophils Absolute: 0 10*3/uL (ref 0.0–0.7)
HCT: 15.8 % — ABNORMAL LOW (ref 36.0–46.0)
Hemoglobin: 5.3 g/dL — CL (ref 12.0–15.0)
Lymphs Abs: 1.7 10*3/uL (ref 0.7–4.0)
MCH: 29.6 pg (ref 26.0–34.0)
MCHC: 33.5 g/dL (ref 30.0–36.0)
MCV: 88.3 fL (ref 78.0–100.0)
Monocytes Absolute: 0.6 10*3/uL (ref 0.1–1.0)
Monocytes Relative: 6 % (ref 3–12)
Neutrophils Relative %: 74 % (ref 43–77)
RBC: 1.79 MIL/uL — ABNORMAL LOW (ref 3.87–5.11)

## 2012-05-14 LAB — GLUCOSE, CAPILLARY: Glucose-Capillary: 168 mg/dL — ABNORMAL HIGH (ref 70–99)

## 2012-05-14 LAB — CBC
MCH: 31.1 pg (ref 26.0–34.0)
MCHC: 35.1 g/dL (ref 30.0–36.0)
MCV: 88.5 fL (ref 78.0–100.0)
Platelets: 120 10*3/uL — ABNORMAL LOW (ref 150–400)
RDW: 15.9 % — ABNORMAL HIGH (ref 11.5–15.5)

## 2012-05-14 LAB — PREPARE RBC (CROSSMATCH)

## 2012-05-14 LAB — PROTIME-INR: Prothrombin Time: 35.4 seconds — ABNORMAL HIGH (ref 11.6–15.2)

## 2012-05-14 LAB — FERRITIN: Ferritin: 14 ng/mL (ref 10–291)

## 2012-05-14 LAB — IRON AND TIBC: TIBC: 262 ug/dL (ref 250–470)

## 2012-05-14 MED ORDER — SODIUM CHLORIDE 0.9 % IV BOLUS (SEPSIS)
500.0000 mL | Freq: Once | INTRAVENOUS | Status: AC
  Administered 2012-05-14: 500 mL via INTRAVENOUS

## 2012-05-14 MED ORDER — DILTIAZEM HCL 100 MG IV SOLR
5.0000 mg/h | INTRAVENOUS | Status: DC
  Administered 2012-05-14: 5 mg/h via INTRAVENOUS

## 2012-05-14 MED ORDER — DILTIAZEM HCL 50 MG/10ML IV SOLN: 10.0000 mg | Freq: Once | INTRAVENOUS | Status: DC

## 2012-05-14 MED ORDER — DARIFENACIN HYDROBROMIDE ER 7.5 MG PO TB24
7.5000 mg | ORAL_TABLET | Freq: Every day | ORAL | Status: DC
  Administered 2012-05-15 – 2012-05-16 (×2): 7.5 mg via ORAL
  Filled 2012-05-14 (×2): qty 1

## 2012-05-14 MED ORDER — DILTIAZEM HCL 25 MG/5ML IV SOLN
10.0000 mg | Freq: Once | INTRAVENOUS | Status: AC
  Administered 2012-05-14: 10 mg via INTRAVENOUS

## 2012-05-14 MED ORDER — SODIUM CHLORIDE 0.9 % IV BOLUS (SEPSIS): 1000.0000 mL | Freq: Once | INTRAVENOUS | Status: DC

## 2012-05-14 MED ORDER — INSULIN ASPART 100 UNIT/ML ~~LOC~~ SOLN
0.0000 [IU] | Freq: Three times a day (TID) | SUBCUTANEOUS | Status: DC
  Administered 2012-05-15: 3 [IU] via SUBCUTANEOUS
  Administered 2012-05-15: 1 [IU] via SUBCUTANEOUS

## 2012-05-14 MED ORDER — SODIUM CHLORIDE 0.9 % IJ SOLN
3.0000 mL | Freq: Two times a day (BID) | INTRAMUSCULAR | Status: DC
  Administered 2012-05-14 – 2012-05-16 (×4): 3 mL via INTRAVENOUS

## 2012-05-14 NOTE — Progress Notes (Signed)
Dr. Earlene Plater made aware of B/P 75-92/40-50. Lungs CTA Sats 100 % Pt resting comfortably.

## 2012-05-14 NOTE — Consult Note (Signed)
Consult Note   Patient ID: RONIT CRANFIELD MRN: [id number], DOB/AGE: [birth year]   Admit date: 05/14/2012 Date of Consult: 05/14/2012   Primary Physician: Johnette Abraham, DO Primary Cardiologist: Lewayne Bunting, MD (EP)  HPI: Ms. Barrientez is a 79 yo female with PMHx s/f atrial fibrillation (on Coumadin), tachy-brady syndrome (s/p Medtronic PPM), chronic diastolic CHF, DM2, HTN, h/o CVA and h/o iron-def anemia who presents to [facility] ED today after c/o lightheadedness and fall.   She reports being in her USOH until Saturday when she became markedly weak and lightheaded. She attempted to reach the bathroom at one point, became lightheaded, fell and reportedly "passed out" for a short period of time (duration unknown). She lives alone, but her family checks up on her regularly. From their accounts, she does not have many issues with falling. She then had another fall due to lightheadedness. She denies striking her head. She denies chest pain, shortness of breath, DOE, PND, orthopnea, palpitations. No active bleeding. She has taken all medications as prescribed. Per her daughter, she has had a "normal" catheterization several years ago. Her symptoms worsened, thus prompting her ED presentation.    Last PM interrogation 03/2012- normal functionality, estimated longevity 8 months, follow-up in 05/2011.   Last 2D echo 09/2009- EF 55-60%, mod MR, mild MVP, mild AI, mod biatrial enlargement; PASP 38 mmHg; no WMAs  In the ED, EKG reveals a-fib with RVR, rate 140s, and diffuse flat ST depressions (new in the inferior and lateral leads). TnI WNL x 1. pBNP 996.2. Full lab work pending- CBC indicates marked anemia with Hgb 5.3/Hct 15.8. INR supratherapeutic at 3.83. FOBT negative. CMET indicates hypoalbimenia at 2.8, otherwise unremarkable. CXR reveals stable cardiomegaly, chronic lung disease, otherwise no acute process. U/A + cx, noncontrast head CT, type & screen pending. Plan is for RBC  transfusion. Started on Cardizem IV with rates in 90s.   Problem List: Past Medical History  Diagnosis Date  . Weight loss, unintentional 2010-2011    8/08-8/10 138-146 lbs, between 8/10-10/11 lost 18 lbs., work-up negative  . Incontinence overflow, stress female     on vesicare  . History of sick sinus syndrome s/p pacemaker Dr. [name] 2000  . Atrial fibrillation     on coumadin followed by Dr. Alexandria Lodge  . History of vaginal bleeding 05/2005    nagative endometrial biopsy  . Restrictive lung disease 1996    PFT's showed mild disease  . Anemia     Iron deficiency, does not tolerate po  . CHF (congestive heart failure)     diastolic dysfunction  . Diabetes mellitus     type 2  diet controlled  . Hypertension     well controlled on 3 agents  . Osteoporosis   . Stroke 2002    R MCA, cardioembolic  . Allergic rhinitis   . Onychomycosis   . History of trichomonal urethritis   . Arthritis   . DJD (degenerative joint disease)   . Constipation   . Hx of hysterectomy     Past Surgical History  Procedure Date  . Abdominal hysterectomy   . Cholecystectomy   . Pacemaker insertion 2000, Dr. [name]  . Cataract extraction      Allergies: No Known Allergies  Home Medications: Prior to Admission medications   Medication Sig Start Date End Date Taking? Authorizing Provider  aspirin 81 MG tablet Take 1 tablet (81 mg total) by mouth daily. 01/05/12  Yes Maitri S Kalia-Reynolds, DO  lisinopril (PRINIVIL,ZESTRIL) 40 MG  tablet Take 1 tablet (40 mg total) by mouth daily. 11/16/11  Yes Maitri S Kalia-Reynolds, DO  metoprolol (LOPRESSOR) 100 MG tablet Take 1 tablet (100 mg total) by mouth 2 (two) times daily. 11/16/11  Yes Maitri S Kalia-Reynolds, DO  solifenacin (VESICARE) 10 MG tablet Take 1 tablet (10 mg total) by mouth daily. 11/16/11  Yes Maitri S Kalia-Reynolds, DO  traMADol (ULTRAM) 50 MG tablet Take 1 tablet (50 mg total) by mouth every 6 (six) hours as needed for pain. 04/09/12  Yes Maitri  S Kalia-Reynolds, DO  warfarin (COUMADIN) 5 MG tablet Take 5-7.5 mg by mouth daily. Monday and Thursday take 7.5 mg and 5 mg all other days   Yes Historical Provider, MD    Inpatient Medications:     (Not in a hospital admission)  Family History  Problem Relation Age of Onset  . Diabetes Sister   . Heart disease       History   Social History  . Marital Status: Widowed    Spouse Name: N/A    Number of Children: 8  . Years of Education: N/A   Occupational History  . retired    Social History Main Topics  . Smoking status: Former Games developer  . Smokeless tobacco: Not on file  . Alcohol Use: 0.0 oz/week    0 drink(s) per week     Comment: occasional use  . Drug Use: No  . Sexually Active: Not on file   Other Topics Concern  . Not on file   Social History Narrative   PPM-[employer]tr[employer]tr-[employer]lyvia home# [phone]# [id number] lives alone, but has 7, of 8, living children who check in on her.She is widowed.     Review of Systems: General: positive for weakness, negative for chills, fever, night sweats or weight changes.  Cardiovascular: negative for chest pain, dyspnea on exertion, edema, orthopnea, palpitations, paroxysmal nocturnal dyspnea or shortness of breath Dermatological: negative for rash Respiratory: negative for cough or wheezing Urologic: positive for urinary frequency, urgency, negative for frank hematuria Abdominal: negative for nausea, vomiting, diarrhea, bright red blood per rectum, melena, or hematemesis Neurologic: positive for lightheadedness, syncope, negative for visual changes, syncope All other systems reviewed and are otherwise negative except as noted above.  Physical Exam: Blood pressure 105/48, pulse 142, temperature 97.6 F (36.4 C), temperature source Oral, resp. rate 21, SpO2 99.00%.    General: Elderly, thin, frail, in no acute distress. Head: Normocephalic, atraumatic, sclera non-icteric, no xanthomas, nares are without discharge.  Neck:  Negative for carotid bruits. JVD not elevated. Lungs: Reduced bilateral breath sounds. No appreciable wheezes, rales, or rhonchi. Breathing is unlabored. Heart: Irregularly irregular, tachycardic, III/VI systolic murmur at apex, with S1 S2. No murmurs, rubs, or gallops appreciated. Abdomen: Soft, non-tender, non-distended with normoactive bowel sounds. No hepatomegaly. No rebound/guarding. No obvious abdominal masses. Msk:  Strength and tone appears normal for age. Extremities: No clubbing, cyanosis or edema.  Distal pedal pulses are 2+ and equal bilaterally. Neuro: Alert and oriented X 3. Moves all extremities spontaneously. Psych:  Responds to questions appropriately with a normal affect.  Labs: Recent Labs  Basename 05/14/12 1026   WBC 8.8   HGB 5.3*   HCT 15.8*   MCV 88.3   PLT 139*   Lab 05/14/12 1026  NA 137  K 4.0  CL 106  CO2 19  BUN 58*  CREATININE 1.08  CALCIUM 8.4  PROT 6.2  BILITOT 0.4  ALKPHOS 65  ALT 7  AST 15  AMYLASE --  LIPASE --  GLUCOSE 225*   Recent Labs  Basename 05/14/12 1026   CKTOTAL --   CKMB --   CKMBINDEX --   TROPONINI <0.30   Radiology/Studies: Dg Chest Portable 1 View  05/14/2012  *RADIOLOGY REPORT*  Clinical Data: Dizziness.  Recent falls.  PORTABLE CHEST - 1 VIEW  Comparison: 03/31/2011 radiographs.  Findings: 1025 hours.  Left subclavian pacemaker lead is unchanged at the right ventricular apex.  There is stable cardiomegaly and aortic atherosclerosis.  There is stable chronic lung disease with hyperinflation and central airway thickening.  No superimposed edema, airspace disease or pleural effusion is identified. Multiple telemetry leads overlie the chest.  IMPRESSION: Stable cardiomegaly and chronic lung disease.  No acute cardiopulmonary process.   Original Report Authenticated By: Carey Bullocks, M.D.     EKG: atrial fibrillation with RVR, 143 bpm, new ST depressions (2 mm) III, V2-V3 (1 mm), more pronounced diffusely  ASSESSMENT:     79 yo female with PMHx s/f atrial fibrillation (on Coumadin), tachy-brady syndrome (s/p Medtronic PPM), chronic diastolic CHF, DM2, HTN, h/o CVA and h/o iron-def anemia who presents to [facility] ED today after c/o lightheadedness, syncope and fall, found to be anemic with a supratherapeutic INR and in a-fib with RVR.   1. Normocytic anemia  - h/o iron-deficiency anemia, intolerant to PO iron 2. Coumadin-induced coagulopathy 3. Syncope with fall 4. Atrial fibrillation with RVR 5. Chronic diastolic CHF 6. Tachy-brady syndrome s/p PPM 7. Type 2 DM 8. HTN 9. H/o CVA 10. Hypoalbuminemia  DISCUSSION/PLAN:  Patient's symptoms (weakness, lightheadedness, syncope with fall) due to underlying anemia. Work-up thus far reveals H/H at 5.3/15.8. Negative FOBT. ED has initiated orders for transfusion. U/A with culture pending. Patient denies active bleeding. Atrial fibrillation with RVR well-controlled on Cardizem gtt currently. Aim for lenient rate-control < 110 bpm given underlying anemia driving this process. Of note, on prior follow-up, Dr. [name] also suggested adding CCB to Lopressor for increased rate-control should HR remain elevated (100s). Regarding anticoagulation, the patient lives alone, but does have family support. From the history, symptoms of incoordination, lightheadedness and syncope are new and likely due to the development of anemia. She typically does not fall. Will need to determine risk of falls once she has been transfused and anemia work-up completed. She will need appropriate discharge planning (home + HH services/[facility] vs facility) to guide continued anticoagulation. INRs have been markedly elevated as recently as 10/2011. Could consider switching to apixaban 2.5 mg given age, weight. Regardless, Coumadin will be held on admission. Follow-up head CT. She is otherwise euvolemic on exam. She denies chest pain, and initial trop-I WNL. ST changes likely demand-related with RVR. Will  continue to monitor volume status.    Signed, R. Hurman Horn, PA-C 05/14/2012, 12:04 PM   As above, patient seen and examined. Briefly she is a pleasant 79 year old female with past medical history of atrial fibrillation, prior CVA on chronic Coumadin, prior pacemaker placement, diastolic congestive heart failure, diabetes, hypertension and anemia who we are asked to evaluate for atrial fibrillation. The patient was doing well until 3 days ago when she noticed generalized weakness. She also had dizziness with standing. She denies melena, hematochezia or hematemesis. No chest pain, palpitations or dyspnea. The patient went to answer the phone today and fell to the ground. She denies syncope. She has been admitted and her hemoglobin is noted to be 5.3 with a normal MCV. Electrocardiogram shows atrial fibrillation with rates in the 140s and diffuse ST depression.  Fecal occult blood negative. Cardiology is asked to evaluate for atrial fibrillation. I agree with adding IV Cardizem for rate control. Given the severity of her anemia I would hold her Coumadin for now. This should be resumed once it is clear she is not bleeding. She apparently is heme-negative and does have a history of chronic anemia although worse at present. Further anemia evaluation per primary care. Agree with transfusion. Follow hemoglobin. Would discontinue aspirin long-term given need for Coumadin. Repeat echocardiogram. Olga Millers 1:14 PM

## 2012-05-14 NOTE — Progress Notes (Signed)
ANTICOAGULATION CONSULT NOTE - Initial Consult  Pharmacy Consult for warfarin Indication: atrial fibrillation  No Known Allergies   Vital Signs: Temp: 97.6 F (36.4 C) (01/27 0940) Temp src: Oral (01/27 0940) BP: 134/59 mmHg (01/27 1145) Pulse Rate: 147  (01/27 1145)  Labs:  Basename 05/14/12 1026  HGB 5.3*  HCT 15.8*  PLT 139*  APTT --  LABPROT 35.4*  INR 3.83*  HEPARINUNFRC --  CREATININE 1.08  CKTOTAL --  CKMB --  TROPONINI <0.30    The CrCl is unknown because both a height and weight (above a minimum accepted value) are required for this calculation.   Medical History: Past Medical History  Diagnosis Date  . Weight loss, unintentional 2010-2011    8/08-8/10 138-146 lbs, between 8/10-10/11 lost 18 lbs., work-up negative  . Incontinence overflow, stress female     on vesicare  . History of sick sinus syndrome s/p pacemaker Dr. [name] 2000  . Atrial fibrillation     on coumadin followed by Dr. Alexandria Lodge  . History of vaginal bleeding 05/2005    nagative endometrial biopsy  . Restrictive lung disease 1996    PFT's showed mild disease  . Anemia     Iron deficiency, does not tolerate po  . CHF (congestive heart failure)     diastolic dysfunction  . Diabetes mellitus     type 2  diet controlled  . Hypertension     well controlled on 3 agents  . Osteoporosis   . Stroke 2002    R MCA, cardioembolic  . Allergic rhinitis   . Onychomycosis   . History of trichomonal urethritis   . Arthritis   . DJD (degenerative joint disease)   . Constipation   . Hx of hysterectomy     Assessment: 51 yof presented with dizziness and fall. She is on chronic coumadin for afib. INR is supratherapeutic today at 3.83. She is also significantly anemic with thrombocytopenia. No obvious signs of bleeding.   Goal of Therapy:  INR 2-3   Plan:  1. No coumadin tonight 2. Daily INR 3. F/u plans for anticoagulation with provider  Nikos Anglemyer, Drake Leach 05/14/2012,1:48 PM

## 2012-05-14 NOTE — ED Notes (Signed)
Pt is here with weakness and dizziness since this weekend. Pt has fell twice.  No cough, vomiting or diarrhea

## 2012-05-14 NOTE — H&P (Signed)
Hospital Admission Note Date: 05/14/2012  Patient name: [name] Medical record number: [id number] Date of birth: 01-Aug-1925 Age: 79 y.o. Gender: female PCP: Johnette Abraham, DO  Medical Service: [facility]  Attending physician:  Dr. Margarito Liner   1st Contact: Dr. Heloise Beecham  Pager: [phone] 2nd Contact: Dr. Bosie Clos  Pager: [phone] After 5 pm or weekends: 1st Contact:  Intern on call  Pager: [phone] 2nd Contact:  Resident on call Pager: [phone]  Chief Complaint: Dizziness / fall  History of Present Illness:  [name] is an 79 yo W with PMH of diastolic congestive heart failure, atrial fibrillation on chronic Coumadin, sick sinus syndrome status post pacemaker in 2000, hypertension, diabetes, stroke, DJD presents for fall/dizziness.   Patient was in usual state of health until Saturday afternoon. Patient's daughter reports that she was talking to mother on the phone and then there was silence. Patient reports that she was answering the phone when she felt weak in the knees, dizzy, and fell to the floor. She denies LOC. Patient reports that floor was carpeted. She cannot remember if she fell backward or hit her head. No bleeding or joint pains after fall. She had another episode of dizziness weakness causing her to fall several hours later, and two additional episodes today. She says she has felt a little nauseated over the past couple of days but otherwise well. She lives alone and performs ADLs independently.  Patient denies any visual changes, confusion, headache, slurred speech, shortness of breath, chest pain, chest tightness, numbness, abdominal pain, hematochezia, melena, diarrhea, constipation, hematuria, dysuria.   Meds: Current Outpatient Rx  Name  Route  Sig  Dispense  Refill  . ASPIRIN 81 MG PO TABS   Oral   Take 1 tablet (81 mg total) by mouth daily.   30 tablet      . LISINOPRIL 40 MG PO TABS   Oral   Take 1 tablet (40 mg total) by mouth daily.   30  tablet   5   . METOPROLOL TARTRATE 100 MG PO TABS   Oral   Take 1 tablet (100 mg total) by mouth 2 (two) times daily.   60 tablet   5   . SOLIFENACIN SUCCINATE 10 MG PO TABS   Oral   Take 1 tablet (10 mg total) by mouth daily.   30 tablet   5   . TRAMADOL HCL 50 MG PO TABS   Oral   Take 1 tablet (50 mg total) by mouth every 6 (six) hours as needed for pain.   60 tablet   0   . WARFARIN SODIUM 5 MG PO TABS   Oral   Take 5-7.5 mg by mouth daily. Monday and Thursday take 7.5 mg and 5 mg all other days           Allergies: Allergies as of 05/14/2012  . (No Known Allergies)   Past Medical History  Diagnosis Date  . Weight loss, unintentional 2010-2011    8/08-8/10 138-146 lbs, between 8/10-10/11 lost 18 lbs., work-up negative  . Incontinence overflow, stress female     on vesicare  . History of sick sinus syndrome s/p pacemaker Dr. [name] 2000  . Atrial fibrillation     on coumadin followed by Dr. Alexandria Lodge  . History of vaginal bleeding 05/2005    nagative endometrial biopsy  . Restrictive lung disease 1996    PFT's showed mild disease  . Anemia     Iron deficiency, does not tolerate po  .  CHF (congestive heart failure)     diastolic dysfunction  . Diabetes mellitus     type 2  diet controlled  . Hypertension     well controlled on 3 agents  . Osteoporosis   . Stroke 2002    R MCA, cardioembolic  . Allergic rhinitis   . Onychomycosis   . History of trichomonal urethritis   . Arthritis   . DJD (degenerative joint disease)   . Constipation   . Hx of hysterectomy    Past Surgical History  Procedure Date  . Abdominal hysterectomy   . Cholecystectomy   . Pacemaker insertion 2000, Dr. [name]  . Cataract extraction    Family History  Problem Relation Age of Onset  . Diabetes Sister   . Heart disease     History   Social History  . Marital Status: Widowed    Spouse Name: N/A    Number of Children: 8  . Years of Education: N/A   Occupational History    . retired    Social History Main Topics  . Smoking status: Former Games developer  . Smokeless tobacco: Not on file  . Alcohol Use: 0.0 oz/week    0 drink(s) per week     Comment: occasional use  . Drug Use: No  . Sexually Active: Not on file   Other Topics Concern  . Not on file   Social History Narrative   PPM-[employer]tr-[employer] home# [phone]# [id number] lives alone, but has 7, of 8, living children who check in on her.She is widowed.    Review of Systems:  10 pt ROS performed, pertinent positives and negatives noted in HPI  Physical Exam: Blood pressure 105/48, pulse 142, temperature 97.6 F (36.4 C), temperature source Oral, resp. rate 21, SpO2 99.00%. Vitals reviewed. General: thin female resting in bed, family at bedside, NAD HEENT: PERRL w arcus senilis, pale conjunctivae, EOMI, MMM of OP Cardiac: Irregularly irregular with rate 80s-120s on our exam, no rubs, murmurs or gallops Pulm: no increased WOB, rales in bilateral bases, good air movement Abd: soft, nontender, nondistended, normoactive bowel sounds Ext: warm and well perfused, no pedal edema,  2+ DP pulses, good capillary refill Neuro: alert and oriented X3, cranial nerves II-XII intact, 5/5 strength in face and bilateral upper and lower extremities, sensation intact in upper and lower extremities.   Lab results: Basic Metabolic Panel:  Basename 05/14/12 1026  NA 137  K 4.0  CL 106  CO2 19  GLUCOSE 225*  BUN 58*  CREATININE 1.08  CALCIUM 8.4  MG --  PHOS --   Liver Function Tests:  Basename 05/14/12 1026  AST 15  ALT 7  ALKPHOS 65  BILITOT 0.4  PROT 6.2  ALBUMIN 2.9*   CBC:  Basename 05/14/12 1026  WBC 8.8  NEUTROABS 6.5  HGB 5.3*  HCT 15.8*  MCV 88.3  PLT 139*   Cardiac Enzymes:  Basename 05/14/12 1026  CKTOTAL --  CKMB --  CKMBINDEX --  TROPONINI <0.30   BNP:  Basename 05/14/12 1026  PROBNP 996.2*   CBG:  Basename 05/14/12 0946  GLUCAP 154*    Coagulation:  Basename 05/14/12 1026  LABPROT 35.4*  INR 3.83*   Imaging results:  Dg Chest Portable 1 View  05/14/2012  *RADIOLOGY REPORT*  Clinical Data: Dizziness.  Recent falls.  PORTABLE CHEST - 1 VIEW  Comparison: 03/31/2011 radiographs.  Findings: 1025 hours.  Left subclavian pacemaker lead is unchanged at the right ventricular apex.  There is stable cardiomegaly  and aortic atherosclerosis.  There is stable chronic lung disease with hyperinflation and central airway thickening.  No superimposed edema, airspace disease or pleural effusion is identified. Multiple telemetry leads overlie the chest.  IMPRESSION: Stable cardiomegaly and chronic lung disease.  No acute cardiopulmonary process.   Original Report Authenticated By: Carey Bullocks, M.D.     Other results: EKG: atrial fibrillation with RVR, diffuse ST segment depression  Assessment & Plan by Problem: Ms. Henckel is an 79 yo W with PMH of diastolic congestive heart failure, atrial fibrillation on chronic Coumadin, sick sinus syndrome status post pacemaker in 2000, hypertension, diabetes, and CVA/TIAs presenting with symptomatic anemia and atrial fibrillation with RVR.   1. Acute on chronic Anemia: Patient w symptomatic anemia. Several falls this weekend preceded by dizziness and weakness; no loss of consciousness. Patient with history of iron deficiency but has been unable to tolerate supplementation at home due to difficulty swallowing large pills. Today,  Hb is 5.3 w MCV 88. Her baseline Hb is 9-10 and last anemia panel in 10/2010: ferritin 18, iron sat 7%, iron 24, B12 330, folate 9.3.   FOBT is negative today and no gross blood in stool. No other obvious source of blood loss (no hematuria, hemoptysis, hematemesis). Differential ddx include: bleed from supratherapeutic INR vs. GI bleed (although less likely because her FOBT was negative).   -Admit to SDU -Check orthostatic vitals -CT head to rule out intracranial hemorrhage  given her supratherapeutic INR -Transfusion 2 units pRBC and check post transfusion CBC -CBC q12hrs -repeat anemia panel   2. Atrial Fibrillation: with RVR (140s). Troponin negative x1. She was initially given 10mg  of IV Dilt but did not control her rate; therefore, dilt gtt was initiated and then stopped due to hypotension.  She was also given 500cc bolus for hypotension.  Cardiology was consulted in the ED and we appreciate cardiology's assistance. She also has a history of sick sinus syndrome with pacemaker placement in 2000.  Home meds include: warfarin and metoprolol. INR is supratherapeutic 3.83 -Warfarin per pharmacy but would hold at this time in setting of supratherapeutic INR -Check PT/INR in AM - Will need to discuss with patient and family the risks/benefits of moving forward with warfarin therapy in setting of multiple falls  3. Hypotension: Suspect hypovolemia, anemia, atrial fibrillation w RVR contributing. BUN:Cr is 58:1.08, suggesting volume depletion. Initial BP was 81/37 but increased to 112/56.  Responding to small bolus of fluid resuscitation and will initiate blood transfusion.   -Monitor in step down  4. Chronic Diastolic Heart Failure: Last 2D echocardiogram is in 09/2009, EF 55-60%. ProBNP is slightly elevated at 996 (baseline 300's). -repeat 2D echo -daily weight - I&O's  5. DM II: HbA1c 04/09/12 was 6.4.  Diet controlled at home.  Glu of 225 today, will give SSI-sensitive while in hospital.  6. History of CVA/TIAs Prior R MCA stroke in 2000. Per daughters, several "ministrokes" since then.  On ASA 81mg  daily at home. No focal neurological deficits on exam. Do not suspect acute CVA.  7. Stress incontinence w dysuria Continues to leak urine. Occasional burning.  - In and out cath, UA+culture - Continue vesicare.  DVT ppx: supratherapeutic INR, SCDs   Code status: FULL  Dispo: Disposition is deferred at this time, awaiting improvement of current medical  problems. Anticipated discharge in approximately 3-4 day(s).   The patient does have a current PCP (KALIA-REYNOLDS, SHELLY, DO), therefore will require OPC follow-up after discharge.   The patient does have transportation  limitations that hinder transportation to clinic appointments.  Signed: Bronson Curb, MD  PGY-1, Internal Medicine Resident Pager: [phone] (7AM-5PM) 05/14/2012, 12:55 PM

## 2012-05-14 NOTE — Progress Notes (Signed)
Dr. Earlene Plater in to see and evaluate pt. NSS bolus hung at 500cc/hr. Blood transfusion increased to 175cc/hr.

## 2012-05-14 NOTE — ED Provider Notes (Signed)
History     CSN: [id number]  Arrival date & time 05/14/12  4098   First MD Initiated Contact with Patient 05/14/12 0945      Chief Complaint  Patient presents with  . Dizziness  . Fall  . Weakness    (Consider location/radiation/quality/duration/timing/severity/associated sxs/prior treatment) The history is provided by the patient.  [name] is a 79 y.o. female sip on Coumadin and has a pacemaker, hypertension, CHF here with weakness, dizziness and fall. She was doing well until 2 days ago when she started becoming weak and fell twice. No head injury. Today she says she may have passed out. No chest pain or shortness of breath or cough or abdominal pain or diarrhea. No recent infections. Was hypotensive and tachycardic in triage and sent to resus room for eval.    Past Medical History  Diagnosis Date  . Weight loss, unintentional 2010-2011    8/08-8/10 138-146 lbs, between 8/10-10/11 lost 18 lbs., work-up negative  . Incontinence overflow, stress female     on vesicare  . History of sick sinus syndrome s/p pacemaker Dr. [name] 2000  . Atrial fibrillation     on coumadin followed by Dr. Alexandria Lodge  . History of vaginal bleeding 05/2005    nagative endometrial biopsy  . Restrictive lung disease 1996    PFT's showed mild disease  . Anemia     Iron deficiency, does not tolerate po  . CHF (congestive heart failure)     diastolic dysfunction  . Diabetes mellitus     type 2  diet controlled  . Hypertension     well controlled on 3 agents  . Osteoporosis   . Stroke 2002    R MCA, cardioembolic  . Allergic rhinitis   . Onychomycosis   . History of trichomonal urethritis   . Arthritis   . DJD (degenerative joint disease)   . Constipation   . Hx of hysterectomy     Past Surgical History  Procedure Date  . Abdominal hysterectomy   . Cholecystectomy   . Pacemaker insertion 2000, Dr. [name]  . Cataract extraction     Family History  Problem Relation Age of Onset  .  Diabetes Sister   . Heart disease      History  Substance Use Topics  . Smoking status: Former Games developer  . Smokeless tobacco: Not on file  . Alcohol Use: 0.0 oz/week    0 drink(s) per week     Comment: occasional use    OB History    Grav Para Term Preterm Abortions TAB SAB Ect Mult Living                  Review of Systems  Neurological: Positive for weakness.  All other systems reviewed and are negative.    Allergies  Review of patient's allergies indicates no known allergies.  Home Medications   Current Outpatient Rx  Name  Route  Sig  Dispense  Refill  . ASPIRIN 81 MG PO TABS   Oral   Take 1 tablet (81 mg total) by mouth daily.   30 tablet      . LISINOPRIL 40 MG PO TABS   Oral   Take 1 tablet (40 mg total) by mouth daily.   30 tablet   5   . METOPROLOL TARTRATE 100 MG PO TABS   Oral   Take 1 tablet (100 mg total) by mouth 2 (two) times daily.   60 tablet   5   .  SOLIFENACIN SUCCINATE 10 MG PO TABS   Oral   Take 1 tablet (10 mg total) by mouth daily.   30 tablet   5   . TRAMADOL HCL 50 MG PO TABS   Oral   Take 1 tablet (50 mg total) by mouth every 6 (six) hours as needed for pain.   60 tablet   0   . WARFARIN SODIUM 5 MG PO TABS   Oral   Take 5-7.5 mg by mouth daily. Monday and Thursday take 7.5 mg and 5 mg all other days           BP 105/48  Pulse 142  Temp 97.6 F (36.4 C) (Oral)  Resp 21  SpO2 99%  Physical Exam  Nursing note and vitals reviewed. Constitutional: She is oriented to person, place, and time.       Tired, uncomfortable   HENT:  Head: Normocephalic.  Mouth/Throat: Oropharynx is clear and moist.  Eyes: Conjunctivae normal are normal. Pupils are equal, round, and reactive to light.  Neck: Normal range of motion. Neck supple.  Cardiovascular: Normal heart sounds.        Tachycardic, irregular   Pulmonary/Chest: Effort normal and breath sounds normal. No respiratory distress. She has no wheezes. She has no rales.        No crackles   Abdominal: Soft. Bowel sounds are normal. She exhibits no distension. There is no tenderness. There is no rebound.  Musculoskeletal: Normal range of motion. She exhibits no edema and no tenderness.  Neurological: She is alert and oriented to person, place, and time.  Skin: Skin is warm and dry.  Psychiatric: She has a normal mood and affect. Her behavior is normal. Judgment and thought content normal.    ED Course  Procedures (including critical care time)  CRITICAL CARE Performed by: Silverio Lay, DAVID   Total critical care time: 45 min   Critical care time was exclusive of separately billable procedures and treating other patients.  Critical care was necessary to treat or prevent imminent or life-threatening deterioration.  Critical care was time spent personally by me on the following activities: development of treatment plan with patient and/or surrogate as well as nursing, discussions with consultants, evaluation of patient's response to treatment, examination of patient, obtaining history from patient or surrogate, ordering and performing treatments and interventions, ordering and review of laboratory studies, ordering and review of radiographic studies, pulse oximetry and re-evaluation of patient's condition.   Labs Reviewed  GLUCOSE, CAPILLARY - Abnormal; Notable for the following:    Glucose-Capillary 154 (*)     All other components within normal limits  CBC WITH DIFFERENTIAL - Abnormal; Notable for the following:    RBC 1.79 (*)     Hemoglobin 5.3 (*)     HCT 15.8 (*)     RDW 15.8 (*)     Platelets 139 (*)     All other components within normal limits  COMPREHENSIVE METABOLIC PANEL - Abnormal; Notable for the following:    Glucose, Bld 225 (*)     BUN 58 (*)     Albumin 2.9 (*)     GFR calc non Af Amer 46 (*)     GFR calc Af Amer 53 (*)     All other components within normal limits  PROTIME-INR - Abnormal; Notable for the following:    Prothrombin Time  35.4 (*)     INR 3.83 (*)     All other components within normal limits  PRO  B NATRIURETIC PEPTIDE - Abnormal; Notable for the following:    Pro B Natriuretic peptide (BNP) 996.2 (*)     All other components within normal limits  TROPONIN I  OCCULT BLOOD, POC DEVICE  URINALYSIS, ROUTINE W REFLEX MICROSCOPIC  URINE CULTURE  TYPE AND SCREEN  PREPARE RBC (CROSSMATCH)   Dg Chest Portable 1 View  05/14/2012  *RADIOLOGY REPORT*  Clinical Data: Dizziness.  Recent falls.  PORTABLE CHEST - 1 VIEW  Comparison: 03/31/2011 radiographs.  Findings: 1025 hours.  Left subclavian pacemaker lead is unchanged at the right ventricular apex.  There is stable cardiomegaly and aortic atherosclerosis.  There is stable chronic lung disease with hyperinflation and central airway thickening.  No superimposed edema, airspace disease or pleural effusion is identified. Multiple telemetry leads overlie the chest.  IMPRESSION: Stable cardiomegaly and chronic lung disease.  No acute cardiopulmonary process.   Original Report Authenticated By: Carey Bullocks, M.D.      1. Anemia   2. Rapid atrial fibrillation   3. Supratherapeutic INR      Date: 05/14/2012-1   Rate: 143  Rhythm: atrial fibrillation  QRS Axis: normal  Intervals: normal  ST/T Wave abnormalities: ST depressions inferiorly and ST depressions laterally  Conduction Disutrbances:none  Narrative Interpretation: Inferior and lateral ST depressions more pronounced, possibly rate related  Old EKG Reviewed: changes noted   Date: 05/14/2012- 2 after cardizem bolus   Rate: 79  Rhythm: atrial fibrillation  QRS Axis: normal  Intervals: normal  ST/T Wave abnormalities: ST depressions inferiorly and ST depressions laterally  Conduction Disutrbances:none  Narrative Interpretation: ST depressions improved   Old EKG Reviewed: unchanged     MDM  [name] is a 79 y.o. female here with weakness, rapid afib. Weakness can be secondary to rapid afib vs  underlying infection. Will get labs, UA, CXR. Will slow rate down with cardizem. Will likely need admission for rapid afib.   10:30 AM HR dec to 80s with cardizem 10mg  IV. Placed on cardizem drip because it came back up to low 100s.   11 AM I called [facility] regarding rapid afib.   11:45 AM Hg 5.3, occ neg. I ordered 2U PRBC. I think her weakness may be from symptomatic anemia. BP dropped with cardizem. Cardizem drip stopped. I bolus her 500 cc. INR elevated to 3.8 but no source of bleeding so I held off on reversing the coumadin.   11:53 AM I called internal medicine teaching service to admit the patient to step down.        Richardean Canal, MD 05/14/12 1153

## 2012-05-14 NOTE — Progress Notes (Signed)
  Echocardiogram 2D Echocardiogram has been performed.  Georgian Co 05/14/2012, 5:03 PM

## 2012-05-14 NOTE — H&P (Signed)
Internal Medicine Attending Admission Note Date: 05/14/2012  Patient name: [name] Medical record number: [id number] Date of birth: 12-05-1925 Age: 79 y.o. Gender: female  I saw and evaluated the patient. I reviewed the resident's note and I agree with the resident's findings and plan as documented in the resident's note, with the following additional comments.  Chief Complaint(s): Dizziness, falls  History - key components related to admission: Patient is an 79 year old woman with history of atrial fibrillation on chronic warfarin, sick sinus syndrome status post pacemaker, hypertension, diabetes, stroke, and other problems as outlined in the medical history, admitted with complaint of recent dizziness with falls at home and possible syncope.  Patient denies any headache, chest pain, shortness of breath, abdominal pain, diarrhea, bright red blood per rectum, or melena.  Physical Exam - key components related to admission:  Filed Vitals:   05/14/12 1315 05/14/12 1400 05/14/12 1430 05/14/12 1600  BP: 115/64 104/57 100/69 113/51  Pulse:    90  Temp:    98.2 F (36.8 C)  TempSrc:    Oral  Resp: 19 17 26 22   Height:    5\' 4"  (1.626 m)  Weight:    122 lb 2.2 oz (55.4 kg)  SpO2:    100%    General: Alert, no distress Lungs: Clear Heart: Irregular; no extra sounds or murmurs Abdomen: Bowel sounds present, soft, nontender Extremities: No edema  Lab results:   Basic Metabolic Panel:  Basename 05/14/12 1026  NA 137  K 4.0  CL 106  CO2 19  GLUCOSE 225*  BUN 58*  CREATININE 1.08  CALCIUM 8.4  MG --  PHOS --   Liver Function Tests:  Basename 05/14/12 1026  AST 15  ALT 7  ALKPHOS 65  BILITOT 0.4  PROT 6.2  ALBUMIN 2.9*    CBC:  Basename 05/14/12 1654 05/14/12 1026  WBC 7.3 8.8  NEUTROABS -- 6.5  HGB 4.6* 5.3*  HCT 13.1* 15.8*  MCV 88.5 88.3  PLT 120* 139*    Cardiac Enzymes:  Basename 05/14/12 1026  CKTOTAL --  CKMB --  CKMBINDEX --  TROPONINI  <0.30    CBG:  Basename 05/14/12 0946  GLUCAP 154*    Anemia Panel:  Basename 05/14/12 1654  VITAMINB12 --  FOLATE --  FERRITIN --  TIBC --  IRON --  RETICCTPCT 3.9*   Coagulation:  Basename 05/14/12 1026  INR 3.83*    Imaging results:  Ct Head Wo Contrast  05/14/2012  *RADIOLOGY REPORT*  Clinical Data: Dizziness, post fall, history of congestive heart failure and atrial fibrillation, on Coumadin  CT HEAD WITHOUT CONTRAST  Technique:  Contiguous axial images were obtained from the base of the skull through the vertex without contrast.  Comparison: 10/18/2010; 10/16/2010; 01/25/2008  Findings:  Grossly unchanged mild diffuse atrophy and geographic encephalomalacia within the right frontal (image 16) and parietal lobes (image 22).  Scattered periventricular hypodensities, right greater than left, are grossly unchanged.  Given background baseline parenchymal abnormalities, there is no CT evidence of acute large territory infarct.  No intraparenchymal or extra-axial mass or hemorrhage.  The ventricles and basilar cisterns are unchanged in size and configuration with unchanged mild saccular dilatation of the right lateral ventricle.  No midline shift. Vascular calcifications within the bilateral carotid siphons.  Scattered minimal opacification of the right anterior ethmoidal air cells. Unchanged sub centimeter osteoid osteoma within the right anterior ethmoidal air cells.  The remaining paranasal sinuses and mastoid air cells are normally aerated.  No air fluid levels. Regional soft tissues are normal.  No displaced calvarial fracture.  IMPRESSION: 1.  No acute intracranial process. 2.  Unchanged atrophy, microvascular ischemic disease and remote infarcts within the right frontal and parietal lobes.   Original Report Authenticated By: Tacey Ruiz, MD    Dg Chest Portable 1 View  05/14/2012  *RADIOLOGY REPORT*  Clinical Data: Dizziness.  Recent falls.  PORTABLE CHEST - 1 VIEW  Comparison:  03/31/2011 radiographs.  Findings: 1025 hours.  Left subclavian pacemaker lead is unchanged at the right ventricular apex.  There is stable cardiomegaly and aortic atherosclerosis.  There is stable chronic lung disease with hyperinflation and central airway thickening.  No superimposed edema, airspace disease or pleural effusion is identified. Multiple telemetry leads overlie the chest.  IMPRESSION: Stable cardiomegaly and chronic lung disease.  No acute cardiopulmonary process.   Original Report Authenticated By: Carey Bullocks, M.D.     Other results: EKG: atrial fibrillation with rapid ventricular response; ST depression in the inferior and anterolateral leads  Assessment & Plan by Problem:  1.  Severe anemia.  Patient has chronic iron deficiency anemia, and presents with symptoms including dizziness, weakness, and falls, with a significant decline in her hemoglobin from a value of 9.6 on 01/05/2012.  She denies any melena or red blood per rectum, and FOBT is negative; she also denies any pain that would suggest a soft tissue or retroperitoneal bleed, and her abdominal exam is unremarkable.  She has a history of adenomatous colonic polyps found on colonoscopy 11/2009, as well as severe diverticulosis throughout the colon.  She denies any apparent blood or melena in her stools, so her anemia may represent chronic GI blood loss; and her chronic anticoagulation would put her at risk for this.  Would add LDH, haptoglobin, and technical smear to her pre-transfusion blood to look for any evidence of hemolysis.  Plan is transfuse; follow hemoglobin; monitor stools closely; stop warfarin, and if she has any signs of acute bleeding then would reverse INR with vitamin K and give FFP.  Patient has a history of presumed cardioembolic stroke, and the long-term risks/benefits of warfarin will need to be carefully considered before resuming anticoagulation for stroke prophylaxis.  Given her history of adenomatous polyps a  colonoscopy may be needed to assist with decision making.  2.  Chronic atrial fibrillation.  Patient was tachycardic in the emergency department and was given 10 mg of IV diltiazem.  Cardiology was consulted.  Her rate has now improved to the 90s.  Plan is monitor; careful rate control if needed.  3.  Other problems and plans as per resident physician's note.

## 2012-05-14 NOTE — Addendum Note (Signed)
Addended by: Neomia Dear on: 05/14/2012 07:53 PM   Modules accepted: Orders

## 2012-05-14 NOTE — Progress Notes (Signed)
   Subjective:    Patient reports feeling weak.    Objective:    Vital Signs:   Filed Vitals:   05/14/12 1935 05/14/12 2025 05/14/12 2040 05/14/12 2050  BP: 107/52 80/56 121/37 99/52  Pulse: 90 38 88 93  Temp: 98.2 F (36.8 C) 98.5 F (36.9 C) 97.8 F (36.6 C)   TempSrc: Oral Oral Oral   Resp: 17 19 23 18   Height:      Weight:      SpO2: 100%        Intake/Output:   Intake/Output Summary (Last 24 hours) at 05/14/12 2239 Last data filed at 05/14/12 2015  Gross per 24 hour  Intake    145 ml  Output      0 ml  Net    145 ml     Physical Exam: GENERAL: alert and oriented; resting comfortably in bed and in no distress LUNGS: clear to auscultation bilaterally, normal work of breathing RECTAL: normal rectal vault, brown appearing stool, no gross blood, no melena    Labs: Basic Metabolic Panel:  Lab 05/14/12 1610  NA 137  K 4.0  CL 106  CO2 19  GLUCOSE 225*  BUN 58*  CREATININE 1.08  CALCIUM 8.4  MG --  PHOS --    CBC:  Lab 05/14/12 1654 05/14/12 1026  WBC 7.3 8.8  NEUTROABS -- 6.5  HGB 4.6* 5.3*  HCT 13.1* 15.8*  MCV 88.5 88.3  PLT 120* 139*    BNP (last 3 results):  Basename 05/14/12 1026  PROBNP 996.2*    Imaging: 2D Echocardiography -Left ventricle: Systolic function was normal. The estimated ejection fraction was in the range of 50% to 55%. - Aortic valve: Mild regurgitation. - Mitral valve: Mild regurgitation. - Left atrium: The atrium was moderately dilated. - Right atrium: The atrium was mildly dilated. - Tricuspid valve: Moderate regurgitation. - Pulmonary arteries: Systolic pressure was moderately increased. PA peak pressure: 57mm Hg (S).   Assessment/ Plan:    Based on echocardiogram, patient likely has some diastolic dysfunction.  Respiratory status stable right now - pulmonary exam is reassuring.  We will bolus and increase blood transfusion rate to address her low blood pressures.  Repeat CBC when transfusion  complete.    Length of Stay: 0 days   Signed by:  Dorthula Rue. Earlene Plater, MD PGY-I, Internal Medicine Pager [phone]  05/14/2012, 10:39 PM

## 2012-05-15 DIAGNOSIS — I959 Hypotension, unspecified: Secondary | ICD-10-CM

## 2012-05-15 DIAGNOSIS — D649 Anemia, unspecified: Secondary | ICD-10-CM | POA: Diagnosis present

## 2012-05-15 DIAGNOSIS — Z7901 Long term (current) use of anticoagulants: Secondary | ICD-10-CM

## 2012-05-15 DIAGNOSIS — I509 Heart failure, unspecified: Secondary | ICD-10-CM

## 2012-05-15 DIAGNOSIS — N393 Stress incontinence (female) (male): Secondary | ICD-10-CM

## 2012-05-15 DIAGNOSIS — Z8679 Personal history of other diseases of the circulatory system: Secondary | ICD-10-CM

## 2012-05-15 DIAGNOSIS — I10 Essential (primary) hypertension: Secondary | ICD-10-CM

## 2012-05-15 DIAGNOSIS — R791 Abnormal coagulation profile: Secondary | ICD-10-CM

## 2012-05-15 DIAGNOSIS — R300 Dysuria: Secondary | ICD-10-CM

## 2012-05-15 DIAGNOSIS — I6350 Cerebral infarction due to unspecified occlusion or stenosis of unspecified cerebral artery: Secondary | ICD-10-CM

## 2012-05-15 DIAGNOSIS — E119 Type 2 diabetes mellitus without complications: Secondary | ICD-10-CM

## 2012-05-15 DIAGNOSIS — I5032 Chronic diastolic (congestive) heart failure: Secondary | ICD-10-CM

## 2012-05-15 DIAGNOSIS — D509 Iron deficiency anemia, unspecified: Principal | ICD-10-CM

## 2012-05-15 LAB — CBC
Platelets: 106 10*3/uL — ABNORMAL LOW (ref 150–400)
Platelets: 108 10*3/uL — ABNORMAL LOW (ref 150–400)
RBC: 2.19 MIL/uL — ABNORMAL LOW (ref 3.87–5.11)
RBC: 2.84 MIL/uL — ABNORMAL LOW (ref 3.87–5.11)
WBC: 6.9 10*3/uL (ref 4.0–10.5)
WBC: 8.1 10*3/uL (ref 4.0–10.5)

## 2012-05-15 LAB — URINE CULTURE: Colony Count: 70000

## 2012-05-15 LAB — GLUCOSE, CAPILLARY
Glucose-Capillary: 143 mg/dL — ABNORMAL HIGH (ref 70–99)
Glucose-Capillary: 209 mg/dL — ABNORMAL HIGH (ref 70–99)
Glucose-Capillary: 115 mg/dL — ABNORMAL HIGH (ref 70–99)

## 2012-05-15 LAB — LACTATE DEHYDROGENASE: LDH: 130 U/L (ref 94–250)

## 2012-05-15 LAB — TECHNOLOGIST SMEAR REVIEW

## 2012-05-15 LAB — PROTIME-INR: INR: 3.67 — ABNORMAL HIGH (ref 0.00–1.49)

## 2012-05-15 MED ORDER — SODIUM CHLORIDE 0.9 % IV SOLN
1000.0000 mg | Freq: Once | INTRAVENOUS | Status: AC
  Administered 2012-05-15: 1000 mg via INTRAVENOUS
  Filled 2012-05-15: qty 20

## 2012-05-15 MED ORDER — METOPROLOL TARTRATE 100 MG PO TABS
100.0000 mg | ORAL_TABLET | Freq: Two times a day (BID) | ORAL | Status: DC
  Administered 2012-05-15 – 2012-05-16 (×3): 100 mg via ORAL
  Filled 2012-05-15 (×5): qty 1

## 2012-05-15 MED ORDER — ONDANSETRON HCL 4 MG/2ML IJ SOLN
4.0000 mg | Freq: Four times a day (QID) | INTRAMUSCULAR | Status: DC | PRN
  Administered 2012-05-15: 4 mg via INTRAVENOUS
  Filled 2012-05-15: qty 2

## 2012-05-15 MED ORDER — ONDANSETRON HCL 4 MG PO TABS: 4.0000 mg | ORAL_TABLET | Freq: Four times a day (QID) | ORAL | Status: DC | PRN

## 2012-05-15 MED ORDER — SODIUM CHLORIDE 0.9 % IV SOLN
25.0000 mg | Freq: Once | INTRAVENOUS | Status: AC
  Administered 2012-05-15: 25 mg via INTRAVENOUS
  Filled 2012-05-15: qty 0.5

## 2012-05-15 NOTE — Progress Notes (Signed)
SUBJECTIVE: The patient is doing well today.  At this time, she denies chest pain, shortness of breath, or any new concerns.     . darifenacin  7.5 mg Oral Daily  . insulin aspart  0-9 Units Subcutaneous TID WC  . iron dextran (INFED/DEXFERRUM) 1000 MG IVPB  1,000 mg Intravenous Once  . metoprolol  100 mg Oral BID  . sodium chloride  3 mL Intravenous Q12H      OBJECTIVE: Physical Exam: Filed Vitals:   05/15/12 0615 05/15/12 0700 05/15/12 0943 05/15/12 1219  BP:  115/52 127/57 114/56  Pulse: 73 67 86 76  Temp:  98.2 F (36.8 C) 98.3 F (36.8 C) 98.4 F (36.9 C)  TempSrc:  Oral Oral Oral  Resp: 17 16 21 22   Height:      Weight:      SpO2:  100% 100% 100%    Intake/Output Summary (Last 24 hours) at 05/15/12 1457 Last data filed at 05/15/12 1400  Gross per 24 hour  Intake 1663.25 ml  Output   1450 ml  Net 213.25 ml    Telemetry reveals afib, V rates 70s  GEN- The patient is elderly appearing, alert and oriented x 3 today.   Head- normocephalic, atraumatic Eyes-  Sclera clear, conjunctiva pink Ears- hearing intact Oropharynx- clear Neck- supple,  Lungs- Clear to ausculation bilaterally, normal work of breathing Heart- irregular rate and rhythm  GI- soft, NT, ND, + BS Extremities- no clubbing, cyanosis, or edema Skin- no rash or lesion  LABS: Basic Metabolic Panel:  Basename 05/14/12 1026  NA 137  K 4.0  CL 106  CO2 19  GLUCOSE 225*  BUN 58*  CREATININE 1.08  CALCIUM 8.4  MG --  PHOS --   Liver Function Tests:  Basename 05/14/12 1026  AST 15  ALT 7  ALKPHOS 65  BILITOT 0.4  PROT 6.2  ALBUMIN 2.9*   No results found for this basename: LIPASE:2,AMYLASE:2 in the last 72 hours CBC:  Basename 05/15/12 0810 05/15/12 0153 05/14/12 1026  WBC 8.1 6.9 --  NEUTROABS -- -- 6.5  HGB 8.6* 6.6* --  HCT 24.4* 19.1* --  MCV 85.9 87.2 --  PLT 108* 106* --   Cardiac Enzymes:  Basename 05/14/12 1026  CKTOTAL --  CKMB --  CKMBINDEX --  TROPONINI  <0.30   BNP: No components found with this basename: POCBNP:3 D-Dimer: No results found for this basename: DDIMER:2 in the last 72 hours Hemoglobin A1C: No results found for this basename: HGBA1C in the last 72 hours Fasting Lipid Panel: No results found for this basename: CHOL,HDL,LDLCALC,TRIG,CHOLHDL,LDLDIRECT in the last 72 hours Thyroid Function Tests:  Basename 05/14/12 1654  TSH 1.123  T4TOTAL --  T3FREE --  THYROIDAB --   Anemia Panel:  Basename 05/14/12 1654  VITAMINB12 249  FOLATE 16.0  FERRITIN 14  TIBC 262  IRON 18*  RETICCTPCT 3.9*    RADIOLOGY: Ct Head Wo Contrast  05/14/2012  *RADIOLOGY REPORT*  Clinical Data: Dizziness, post fall, history of congestive heart failure and atrial fibrillation, on Coumadin  CT HEAD WITHOUT CONTRAST  Technique:  Contiguous axial images were obtained from the base of the skull through the vertex without contrast.  Comparison: 10/18/2010; 10/16/2010; 01/25/2008  Findings:  Grossly unchanged mild diffuse atrophy and geographic encephalomalacia within the right frontal (image 16) and parietal lobes (image 22).  Scattered periventricular hypodensities, right greater than left, are grossly unchanged.  Given background baseline parenchymal abnormalities, there is no CT evidence of  acute large territory infarct.  No intraparenchymal or extra-axial mass or hemorrhage.  The ventricles and basilar cisterns are unchanged in size and configuration with unchanged mild saccular dilatation of the right lateral ventricle.  No midline shift. Vascular calcifications within the bilateral carotid siphons.  Scattered minimal opacification of the right anterior ethmoidal air cells. Unchanged sub centimeter osteoid osteoma within the right anterior ethmoidal air cells.  The remaining paranasal sinuses and mastoid air cells are normally aerated.  No air fluid levels. Regional soft tissues are normal.  No displaced calvarial fracture.  IMPRESSION: 1.  No acute  intracranial process. 2.  Unchanged atrophy, microvascular ischemic disease and remote infarcts within the right frontal and parietal lobes.   Original Report Authenticated By: Tacey Ruiz, MD    Dg Chest Portable 1 View  05/14/2012  *RADIOLOGY REPORT*  Clinical Data: Dizziness.  Recent falls.  PORTABLE CHEST - 1 VIEW  Comparison: 03/31/2011 radiographs.  Findings: 1025 hours.  Left subclavian pacemaker lead is unchanged at the right ventricular apex.  There is stable cardiomegaly and aortic atherosclerosis.  There is stable chronic lung disease with hyperinflation and central airway thickening.  No superimposed edema, airspace disease or pleural effusion is identified. Multiple telemetry leads overlie the chest.  IMPRESSION: Stable cardiomegaly and chronic lung disease.  No acute cardiopulmonary process.   Original Report Authenticated By: Carey Bullocks, M.D.     ASSESSMENT AND PLAN:  79 yo female with PMHx of persistent atrial fibrillation (on Coumadin), tachy-brady syndrome (s/p [employer] PPM), chronic diastolic CHF, DM2, HTN, h/o CVA and h/o iron-def anemia who presents to [facility] ED after c/o lightheadedness, syncope and fall, found to be anemic with a supratherapeutic INR and in a-fib with RVR.   1. Normocytic anemia  - h/o iron-deficiency anemia,  S/p 2 units PRBCs with significant clinical improvement Workup per primary team  2. Coumadin-induced coagulopathy  Coumadin on hold for now  3. Syncope with fall  Due to profound anemia most likely No further workup planned  4. Atrial fibrillation with RVR  V rates are presently well controlled No changes at this time  5. Chronic diastolic CHF  Stable No change required today  6. Tachy-brady syndrome s/p PPM   7. Type 2 DM   Per primary team  8. HTN  Stable No change required today  9. H/o CVA    Hillis Range, MD 05/15/2012 2:57 PM

## 2012-05-15 NOTE — Progress Notes (Signed)
This note also relates to the following rows which could not be included: BP - Cannot attach notes to unvalidated device data MAP (mmHg) - Cannot attach notes to unvalidated device data ECG Heart Rate - Cannot attach notes to unvalidated device data Resp - Cannot attach notes to unvalidated device data   Infed started

## 2012-05-15 NOTE — Progress Notes (Signed)
ANTICOAGULATION CONSULT NOTE - Follow Up Consult  Pharmacy Consult for Coumadin Indication: atrial fibrillation  No Known Allergies  Patient Measurements: Height: 5\' 4"  (162.6 cm) Weight: 122 lb 2.2 oz (55.4 kg) IBW/kg (Calculated) : 54.7   Vital Signs: Temp: 98.2 F (36.8 C) (01/28 0700) Temp src: Oral (01/28 0700) BP: 115/52 mmHg (01/28 0700) Pulse Rate: 67  (01/28 0700)  Labs:  Basename 05/15/12 0153 05/14/12 1654 05/14/12 1026  HGB 6.6* 4.6* --  HCT 19.1* 13.1* 15.8*  PLT 106* 120* 139*  APTT -- -- --  LABPROT 34.3* -- 35.4*  INR 3.67* -- 3.83*  HEPARINUNFRC -- -- --  CREATININE -- -- 1.08  CKTOTAL -- -- --  CKMB -- -- --  TROPONINI -- -- <0.30    Estimated Creatinine Clearance: 33.5 ml/min (by C-G formula based on Cr of 1.08).   Medications:  Scheduled:    . darifenacin  7.5 mg Oral Daily  . [COMPLETED] diltiazem  10 mg Intravenous Once  . insulin aspart  0-9 Units Subcutaneous TID WC  . [COMPLETED] sodium chloride  500 mL Intravenous Once  . [COMPLETED] sodium chloride  500 mL Intravenous Once  . sodium chloride  3 mL Intravenous Q12H  . [DISCONTINUED] diltiazem  10 mg Intravenous Once  . [DISCONTINUED] sodium chloride  1,000 mL Intravenous Once    Assessment: 79 y/o F presented with dizziness and a fall.  She is on chronic Coumadin for atrial fibrillation. INR on presentation was 3.83, today INR remains supratherapeutic at 3.67. Will continue to hold Coumadin today.   Patients Hgb dropped to 4.6 yesterday and patient has required 3 units of blood.  Repeat Hgb has been 6.6 and 8.6.. Plts are 106 << 120. There are no obvious signs of bleeding at this time.   Patient's SCr 1.08 and CrCl is 33.39ml/min.   Goal of Therapy:  INR 2-3 Monitor platelets by anticoagulation protocol: Yes   Plan:  -No Coumadin today  -Daily PT/INR -Monitor hemoglobin/hematocrit  -Follow-up anticoagulation plans with provider   Micheline Chapman 05/15/2012,8:02 AM  I have  reviewed and discussed the above patient with Lurena Joiner and agree with her assessment and plan. The patient has also been discussed during our internal medicine rounds. The patient has no clear source of bleeding and is likely getting a GI consult. We will follow up with MD plans and re-start coumadin when patient falls into therapeutic range if the physician has no obvious source of bleeding and they feel it is safe to do so.   Thank you for the consult,   Abran Duke, PharmD Clinical Pharmacist Phone: [phone] Pager: [phone] 05/15/2012 9:17 AM

## 2012-05-15 NOTE — Progress Notes (Signed)
Utilization Review Completed.Trampas Stettner T12/11/2012  

## 2012-05-15 NOTE — Progress Notes (Signed)
Subjective: Patient sitting up in bed, says she feels "great." Wants to get out of bed. No dizziness. No BM. Received 3U pRBCs last night. Post transfusion Hb 8.6. Repeat rectal exam last night was negative for gross blood and FOBT negative. Denies CP, SOB, N/V, dizziness, abdominal pain, diarrhea.   Objective: Vital signs in last 24 hours: Filed Vitals:   05/15/12 0530 05/15/12 0600 05/15/12 0615 05/15/12 0700  BP:  125/56  115/52  Pulse: 68 83 73 67  Temp:  98.3 F (36.8 C)  98.2 F (36.8 C)  TempSrc:  Oral  Oral  Resp: 16 16 17 16   Height:      Weight:      SpO2:    100%   Weight change:   Intake/Output Summary (Last 24 hours) at 05/15/12 0928 Last data filed at 05/15/12 0700  Gross per 24 hour  Intake 1181.25 ml  Output   1450 ml  Net -268.75 ml  Vitals reviewed.  General: thin female sitting up in bed, cheerful, 2 daughters at bedside.  HEENT: PERRL w arcus senilis,  EOMI, MMM of OP  Cardiac: Irregularly irregular with rate 80s-90s on my exam, no rubs, murmurs or gallops  Pulm: no increased WOB, fine rales in bilateral bases, good air movement  Abd: soft, nontender, nondistended, normoactive bowel sounds  Ext: warm and well perfused, no pedal edema, 2+ DP pulses, good capillary refill  Neuro: alert and oriented X3, cranial nerves II-XII intact, 5/5 strength in face and bilateral upper and lower extremities, sensation intact in upper and lower extremities.    Lab Results: Basic Metabolic Panel:  Lab 05/14/12 1191  NA 137  K 4.0  CL 106  CO2 19  GLUCOSE 225*  BUN 58*  CREATININE 1.08  CALCIUM 8.4  MG --  PHOS --   Liver Function Tests:  Lab 05/14/12 1026  AST 15  ALT 7  ALKPHOS 65  BILITOT 0.4  PROT 6.2  ALBUMIN 2.9*   CBC:  Lab 05/15/12 0810 05/15/12 0153 05/14/12 1026  WBC 8.1 6.9 --  NEUTROABS -- -- 6.5  HGB 8.6* 6.6* --  HCT 24.4* 19.1* --  MCV 85.9 87.2 --  PLT 108* 106* --   Cardiac Enzymes:  Lab 05/14/12 1026  CKTOTAL --  CKMB --   CKMBINDEX --  TROPONINI <0.30   BNP:  Lab 05/14/12 1026  PROBNP 996.2*   CBG:  Lab 05/15/12 0810 05/14/12 2142 05/14/12 1757 05/14/12 0946  GLUCAP 110* 168* 119* 154*   Thyroid Function Tests:  Lab 05/14/12 1654  TSH 1.123  T4TOTAL --  FREET4 --  T3FREE --  THYROIDAB --   Coagulation:  Lab 05/15/12 0153 05/14/12 1026  LABPROT 34.3* 35.4*  INR 3.67* 3.83*   Anemia Panel:  Lab 05/14/12 1654  VITAMINB12 249  FOLATE 16.0  FERRITIN 14  TIBC 262  IRON 18*  RETICCTPCT 3.9*   Urinalysis:  Lab 05/14/12 1742  COLORURINE YELLOW  LABSPEC 1.014  PHURINE 5.5  GLUCOSEU NEGATIVE  HGBUR NEGATIVE  BILIRUBINUR NEGATIVE  KETONESUR NEGATIVE  PROTEINUR NEGATIVE  UROBILINOGEN 0.2  NITRITE NEGATIVE  LEUKOCYTESUR SMALL*   Micro Results: Recent Results (from the past 240 hour(s))  MRSA PCR SCREENING     Status: Normal   Collection Time   05/14/12  4:05 PM      Component Value Range Status Comment   MRSA by PCR NEGATIVE  NEGATIVE Final    Studies/Results: Ct Head Wo Contrast  05/14/2012  *RADIOLOGY REPORT*  Clinical Data: Dizziness, post fall, history of congestive heart failure and atrial fibrillation, on Coumadin  CT HEAD WITHOUT CONTRAST  Technique:  Contiguous axial images were obtained from the base of the skull through the vertex without contrast.  Comparison: 10/18/2010; 10/16/2010; 01/25/2008  Findings:  Grossly unchanged mild diffuse atrophy and geographic encephalomalacia within the right frontal (image 16) and parietal lobes (image 22).  Scattered periventricular hypodensities, right greater than left, are grossly unchanged.  Given background baseline parenchymal abnormalities, there is no CT evidence of acute large territory infarct.  No intraparenchymal or extra-axial mass or hemorrhage.  The ventricles and basilar cisterns are unchanged in size and configuration with unchanged mild saccular dilatation of the right lateral ventricle.  No midline shift. Vascular  calcifications within the bilateral carotid siphons.  Scattered minimal opacification of the right anterior ethmoidal air cells. Unchanged sub centimeter osteoid osteoma within the right anterior ethmoidal air cells.  The remaining paranasal sinuses and mastoid air cells are normally aerated.  No air fluid levels. Regional soft tissues are normal.  No displaced calvarial fracture.  IMPRESSION: 1.  No acute intracranial process. 2.  Unchanged atrophy, microvascular ischemic disease and remote infarcts within the right frontal and parietal lobes.   Original Report Authenticated By: Tacey Ruiz, MD    Dg Chest Portable 1 View  05/14/2012  *RADIOLOGY REPORT*  Clinical Data: Dizziness.  Recent falls.  PORTABLE CHEST - 1 VIEW  Comparison: 03/31/2011 radiographs.  Findings: 1025 hours.  Left subclavian pacemaker lead is unchanged at the right ventricular apex.  There is stable cardiomegaly and aortic atherosclerosis.  There is stable chronic lung disease with hyperinflation and central airway thickening.  No superimposed edema, airspace disease or pleural effusion is identified. Multiple telemetry leads overlie the chest.  IMPRESSION: Stable cardiomegaly and chronic lung disease.  No acute cardiopulmonary process.   Original Report Authenticated By: Carey Bullocks, M.D.    Medications: I have reviewed the patient's current medications. Scheduled Meds:   . darifenacin  7.5 mg Oral Daily  . insulin aspart  0-9 Units Subcutaneous TID WC  . iron dextran (INFED/DEXFERRUM) 1000 MG IVPB  1,000 mg Intravenous Once  . iron dextran (INFED/DEXFERRUM) infusion  25 mg Intravenous Once  . metoprolol  100 mg Oral BID  . sodium chloride  3 mL Intravenous Q12H   Continuous Infusions:  PRN Meds:.ondansetron, ondansetron  Assessment/Plan: [name] is an 79 yo W with PMH of diastolic congestive heart failure, atrial fibrillation on chronic Coumadin, sick sinus syndrome status post pacemaker in 2000, hypertension,  diabetes, and CVA/TIAs presenting with symptomatic anemia and atrial fibrillation with RVR.   1. Acute on chronic Anemia:  Baseline Hb is 9. Patient w symptomatic anemia with Hb on admission 5.3-->4.6, recovered to 8.6 s/p transfusion of 3U pRBCs. No clear source of active bleeding. No blood in stool on 2 rectal exams, and negative FOBT x2. No bleed on head CT, no back pain to suggest retroperitoneal hematoma. She is on chronic anticoagulation w warfarin in setting of A. Fib w multiple prior CVAs. INR supratherapeutic at 3.8 on admission, now 3.6 this am. Did not reverse with VitK or FFP due to concern for high embolic stroke risk.  Ultimately suspect that drop in Hb is chronic process, possibly related to known diverticulosis or bleeding from colonic polyps. No active hemorrhage as patient's hemoglobin has responded well to transfusion. Patient is iron deficient, but not taking oral supplementation at home. She adamantly refuses repeat colonoscopy. Will provide IV  iron today. Plan is to have PT evaluate patient, monitor Hb today and tomorrow morning. If stable, she can follow-up as outpatient. May require repeat IV iron in future if unable to tolerate po.  - IV iron - CBC tomorrow am  2. Atrial Fibrillation w RVR RVR that was present on admission(140s) has resolved on diltiazem drip. Suspect exacerbated by acute anemia. Troponin negative x1. Cardiology was consulted in the ED and we appreciate their assistance. She also has a history of sick sinus syndrome with pacemaker placement in 2000.  Home meds include: warfarin and metoprolol. INR is supratherapeutic 3.83--> 3.6 today.  Heart rate controlled this am 60s-90s. Off dilt drip since last night - Restarting oral lopressor today. Consider adding oral CCB if rate not controlled on metop (per Cards) - Holding warfarin, consider apcixiban (less bleeding SE) as outpatient if Hb stable.   3. Hypotension:  Suspect hypovolemia, anemia, atrial fibrillation  w RVR contributing. BUN:Cr is 58:1.08, suggesting volume depletion. Initial BP was 81/37 but increased to 112/56 with fluid bolus. BPs improved w transfusion and IV bolus. - Appears to be resolving - 500cc boluses prn  4. Chronic Diastolic Heart Failure:  Last 2D echocardiogram is in 09/2009, EF 55-60%. ProBNP is slightly elevated at 996 (baseline 300's). Repeat echo shows normal EF. Well compensated. Lisinopril held on admission as pt w some hypotension. -daily weight  - I&O's   5. DM II:  HbA1c 04/09/12 was 6.4. Diet controlled at home.  - SSI-sensitive while in hospital.   6. History of CVA/TIAs  Prior R MCA stroke in 2000. Per daughters, several "ministrokes" since then. On ASA 81mg  daily at home. No focal neurological deficits on exam, no bleeding or obvious ischemia on head CT. Do not suspect acute CVA.   7. Stress incontinence w dysuria  Continues to leak urine. Occasional burning.  - UA with 0-2 WBCs, some contamination.  - Continue vesicare.   DVT ppx: supratherapeutic INR, SCDs     Dispo: Disposition is deferred at this time, awaiting improvement of current medical problems.  Anticipated discharge in approximately 1 day(s).   The patient does have a current PCP ([name], SHELLY, DO), therefore will be requiring OPC follow-up after discharge.   The patient does not have transportation limitations that hinder transportation to clinic appointments.  .Services Needed at time of discharge: Y = Yes, Blank = No PT:   OT:   RN:   Equipment:   Other:     LOS: 1 day   Bronson Curb 05/15/2012, 9:28 AM

## 2012-05-15 NOTE — Progress Notes (Signed)
Internal Medicine Attending  Date: 05/15/2012  Patient name: [name] Medical record number: [id number] Date of birth: November 13, 1925 Age: 79 y.o. Gender: female  I saw and evaluated the patient and discussed her care with house staff. I reviewed the resident's note by Dr. Heloise Beecham and I agree with the resident's findings and plans as documented in her note.

## 2012-05-16 ENCOUNTER — Ambulatory Visit: Payer: Medicare HMO

## 2012-05-16 LAB — GLUCOSE, CAPILLARY: Glucose-Capillary: 82 mg/dL (ref 70–99)

## 2012-05-16 LAB — BASIC METABOLIC PANEL
BUN: 27 mg/dL — ABNORMAL HIGH (ref 6–23)
GFR calc Af Amer: 63 mL/min — ABNORMAL LOW (ref >90)
GFR calc non Af Amer: 54 mL/min — ABNORMAL LOW (ref >90)
Potassium: 4.2 mEq/L (ref 3.5–5.1)
Sodium: 141 mEq/L (ref 135–145)

## 2012-05-16 LAB — CBC WITH DIFFERENTIAL/PLATELET
Basophils Absolute: 0 10*3/uL (ref 0.0–0.1)
Basophils Relative: 0 % (ref 0–1)
Eosinophils Absolute: 0.1 10*3/uL (ref 0.0–0.7)
MCH: 30.4 pg (ref 26.0–34.0)
MCHC: 34.6 g/dL (ref 30.0–36.0)
Neutro Abs: 4.9 10*3/uL (ref 1.7–7.7)
Neutrophils Relative %: 65 % (ref 43–77)
Platelets: 119 10*3/uL — ABNORMAL LOW (ref 150–400)
RDW: 16.1 % — ABNORMAL HIGH (ref 11.5–15.5)

## 2012-05-16 LAB — PROTIME-INR
INR: 2.05 — ABNORMAL HIGH (ref 0.00–1.49)
Prothrombin Time: 22.3 seconds — ABNORMAL HIGH (ref 11.6–15.2)

## 2012-05-16 NOTE — Progress Notes (Signed)
Physical Therapy Evaluation Patient Details Name: [name] MRN: [id number] DOB: Oct 19, 1925 Today's Date: 05/16/2012 Time: 4098-1191 PT Time Calculation (min): 31 min  PT Assessment / Plan / Recommendation Clinical Impression  Pt is 79 yo female with acute on chronic anemia who is still experiencing mild weakness but is mod I with mobility. She does not need acute PT and will likely d/c today but so recommend HHPT to help her strengthen and return to PLOF.     PT Assessment  All further PT needs can be met in the next venue of care    Follow Up Recommendations  Home health PT;Supervision - Intermittent    Does the patient have the potential to tolerate intense rehabilitation      Barriers to Discharge        Equipment Recommendations  None recommended by PT    Recommendations for Other Services     Frequency      Precautions / Restrictions Precautions Precautions: None Restrictions Weight Bearing Restrictions: No   Pertinent Vitals/Pain No c/o pain, VSS      Mobility  Bed Mobility Bed Mobility: Not assessed (pt up on side of bed independently) Transfers Transfers: Sit to Stand;Stand to Sit Sit to Stand: 6: Modified independent (Device/Increase time);From bed Stand to Sit: 6: Modified independent (Device/Increase time);To bed Details for Transfer Assistance: pt transfers safely without use of hands Ambulation/Gait Ambulation/Gait Assistance: 6: Modified independent (Device/Increase time) Ambulation Distance (Feet): 250 Feet Assistive device: Straight cane Ambulation/Gait Assistance Details: pt ambulates with decreased speed but otherwise WFL, she reports that she still feels a bit tired and weak Gait Pattern: Step-through pattern Gait velocity: decreased General Gait Details: pt holds cane in right hand and uses appropriately Stairs: No Wheelchair Mobility Wheelchair Mobility: No    Shoulder Instructions     Exercises General Exercises - Lower  Extremity Ankle Circles/Pumps: AROM;Both;15 reps;Seated   PT Diagnosis: Generalized weakness  PT Problem List: Decreased strength;Decreased activity tolerance PT Treatment Interventions:     PT Goals    Visit Information  Last PT Received On: 05/16/12 Assistance Needed: +1    Subjective Data  Subjective: pt reports that she is very independent Patient Stated Goal: return home   Prior Functioning  Home Living Lives With: Alone Available Help at Discharge: Family;Available PRN/intermittently Type of Home: House Home Access: Stairs to enter [employer] of Steps: 2 Home Layout: One level Home Adaptive Equipment: Straight cane Prior Function Level of Independence: Independent with assistive device(s) Able to Take Stairs?: Yes Driving: No Vocation: Retired Comments: pt does her own cooking, cleaning, and laundry. Her family comes to visit daily and they take her to the grocery store and MD appts. She reports that she has an emergency "box"? at home but nothing to wear around her neck for emergencies. She is planning to speak with her family about a life alert button. Communication Communication: No difficulties    Cognition  Overall Cognitive Status: Appears within functional limits for tasks assessed/performed Arousal/Alertness: Awake/alert Orientation Level: Oriented X4 / Intact Behavior During Session: [facility] for tasks performed    Extremity/Trunk Assessment Right Upper Extremity Assessment RUE ROM/Strength/Tone: WFL for tasks assessed RUE Sensation: WFL - Light Touch;WFL - Proprioception RUE Coordination: WFL - gross motor Left Upper Extremity Assessment LUE ROM/Strength/Tone: WFL for tasks assessed LUE Sensation: WFL - Light Touch;WFL - Proprioception LUE Coordination: WFL - gross motor Right Lower Extremity Assessment RLE ROM/Strength/Tone: Deficits RLE ROM/Strength/Tone Deficits: 4/5 throughout hip flex, knee flex/ ext, generalized weakness  noted due to  this anemic event. Swelling noted right knee and she calls it her "bad knee" RLE Sensation: WFL - Light Touch;WFL - Proprioception RLE Coordination: WFL - gross motor Left Lower Extremity Assessment LLE ROM/Strength/Tone: Deficits LLE ROM/Strength/Tone Deficits: generalized weakness noted, hip flex 4/5, knee ext 4+/5, knee flex 4/5 LLE Sensation: WFL - Proprioception;WFL - Light Touch LLE Coordination: WFL - gross motor Trunk Assessment Trunk Assessment: Normal   Balance Balance Balance Assessed: Yes Dynamic Standing Balance Dynamic Standing - Balance Support: Right upper extremity supported;During functional activity Dynamic Standing - Level of Assistance: 6: Modified independent (Device/Increase time) Dynamic Standing - Comments: pt able to reach down and pick objects up off floor while holding cane in right hand, as well as turn in a circle and turn head with no LOB  End of Session PT - End of Session Equipment Utilized During Treatment: Gait belt Activity Tolerance: Patient tolerated treatment well Patient left: in bed;with call bell/phone within reach Nurse Communication: Mobility status  GP   Lyanne Co, PT  Acute Rehab Services  [phone]   Lyanne Co 05/16/2012, 12:56 PM

## 2012-05-16 NOTE — Progress Notes (Signed)
Internal Medicine Attending  Date: 05/16/2012  Patient name: [name] Medical record number: [id number] Date of birth: [birth year] Age: 79 y.o. Gender: female  I saw and evaluated the patient and discussed her care on a.m. rounds with house staff. I reviewed the resident's note by Dr. Heloise Beecham and I agree with the resident's findings and plans as documented in her note.

## 2012-05-16 NOTE — Progress Notes (Signed)
Patient ID: [name], female   DOB: 1925/08/15, 79 y.o.   MRN: [id number] Subjective:  No chest pain or sob.  Objective:  Vital Signs in the last 24 hours: Temp:  [98.2 F (36.8 C)-98.6 F (37 C)] 98.2 F (36.8 C) (01/29 0727) Pulse Rate:  [64-86] 67  (01/29 0727) Resp:  [15-25] 16  (01/29 0727) BP: (102-127)/(36-59) 121/58 mmHg (01/29 0727) SpO2:  [100 %] 100 % (01/29 0727) Weight:  [124 lb 9 oz (56.5 kg)] 124 lb 9 oz (56.5 kg) (01/29 0457)  Intake/Output from previous day: 01/28 0701 - 01/29 0700 In: 810 [P.O.:240; IV Piggyback:570] Out: 901 [Urine:900; Stool:1] Intake/Output from this shift: Total I/O In: 360 [P.O.:360] Out: 700 [Urine:700]  Physical Exam: Well appearing elderly woman, NAD HEENT: Unremarkable Neck:  No JVD, no thyromegally Lungs:  Clear with no wheezes, rales, or rhonchi HEART:  IRegular rate rhythm, no murmurs, no rubs, no clicks Abd:  soft, positive bowel sounds, no organomegally, no rebound, no guarding Ext:  2 plus pulses, no edema, no cyanosis, no clubbing Skin:  No rashes no nodules Neuro:  CN II through XII intact, motor grossly intact  Lab Results:  Basename 05/16/12 0505 05/15/12 0810  WBC 7.5 8.1  HGB 8.4* 8.6*  PLT 119* 108*    Basename 05/16/12 0505 05/14/12 1026  NA 141 137  K 4.2 4.0  CL 113* 106  CO2 22 19  GLUCOSE 113* 225*  BUN 27* 58*  CREATININE 0.94 1.08    Basename 05/14/12 1026  TROPONINI <0.30   Hepatic Function Panel  Basename 05/14/12 1026  PROT 6.2  ALBUMIN 2.9*  AST 15  ALT 7  ALKPHOS 65  BILITOT 0.4  BILIDIR --  IBILI --   No results found for this basename: CHOL in the last 72 hours No results found for this basename: PROTIME in the last 72 hours  Imaging: Ct Head Wo Contrast  05/14/2012  *RADIOLOGY REPORT*  Clinical Data: Dizziness, post fall, history of congestive heart failure and atrial fibrillation, on Coumadin  CT HEAD WITHOUT CONTRAST  Technique:  Contiguous axial images were  obtained from the base of the skull through the vertex without contrast.  Comparison: 10/18/2010; 10/16/2010; 01/25/2008  Findings:  Grossly unchanged mild diffuse atrophy and geographic encephalomalacia within the right frontal (image 16) and parietal lobes (image 22).  Scattered periventricular hypodensities, right greater than left, are grossly unchanged.  Given background baseline parenchymal abnormalities, there is no CT evidence of acute large territory infarct.  No intraparenchymal or extra-axial mass or hemorrhage.  The ventricles and basilar cisterns are unchanged in size and configuration with unchanged mild saccular dilatation of the right lateral ventricle.  No midline shift. Vascular calcifications within the bilateral carotid siphons.  Scattered minimal opacification of the right anterior ethmoidal air cells. Unchanged sub centimeter osteoid osteoma within the right anterior ethmoidal air cells.  The remaining paranasal sinuses and mastoid air cells are normally aerated.  No air fluid levels. Regional soft tissues are normal.  No displaced calvarial fracture.  IMPRESSION: 1.  No acute intracranial process. 2.  Unchanged atrophy, microvascular ischemic disease and remote infarcts within the right frontal and parietal lobes.   Original Report Authenticated By: Tacey Ruiz, MD    Dg Chest Portable 1 View  05/14/2012  *RADIOLOGY REPORT*  Clinical Data: Dizziness.  Recent falls.  PORTABLE CHEST - 1 VIEW  Comparison: 03/31/2011 radiographs.  Findings: 1025 hours.  Left subclavian pacemaker lead is unchanged at the right  ventricular apex.  There is stable cardiomegaly and aortic atherosclerosis.  There is stable chronic lung disease with hyperinflation and central airway thickening.  No superimposed edema, airspace disease or pleural effusion is identified. Multiple telemetry leads overlie the chest.  IMPRESSION: Stable cardiomegaly and chronic lung disease.  No acute cardiopulmonary process.   Original  Report Authenticated By: Carey Bullocks, M.D.     Cardiac Studies: Tele - atrial fib with a controlled VR Assessment/Plan:  1. Atrial fibrillation - her ventricular rate is controlled 2. Anemia - s/p transfusion. What is the etiology of her anemia? Do we know that she is not hemolyzing. Consider checking Haptoglobin. Consider heme consult. She denies bleeding.  3. Stroke - she had initially stroked off of coumadin. Her risk for recurrent stroke is high off of coumadin.  LOS: 2 days    Devlon Dosher,M.D. 05/16/2012, 9:26 AM

## 2012-05-16 NOTE — Discharge Summary (Signed)
Internal Medicine Teaching [facility] Discharge Note  Name: [name] MRN: [id number] DOB: 1925-07-23 79 y.o.  Date of Admission: 05/14/2012  9:45 AM Date of Discharge: 05/16/2012 Attending Physician: Farley Ly, MD  Discharge Diagnosis: 1. Acute on chronic Anemia:  2. Atrial Fibrillation w RVR  3. Chronic Diastolic Heart Failure:  4. DM II:  5. History of CVA/TIAs  6. Stress incontinence    .  Discharge Medications:   Medication List     As of 05/16/2012  2:10 PM    STOP taking these medications         warfarin 5 MG tablet   Commonly known as: COUMADIN      TAKE these medications         aspirin 81 MG tablet   Take 1 tablet (81 mg total) by mouth daily.      lisinopril 40 MG tablet   Commonly known as: PRINIVIL,ZESTRIL   Take 1 tablet (40 mg total) by mouth daily.      metoprolol 100 MG tablet   Commonly known as: LOPRESSOR   Take 1 tablet (100 mg total) by mouth 2 (two) times daily.      solifenacin 10 MG tablet   Commonly known as: VESICARE   Take 1 tablet (10 mg total) by mouth daily.      traMADol 50 MG tablet   Commonly known as: ULTRAM   Take 1 tablet (50 mg total) by mouth every 6 (six) hours as needed for pain.        Disposition and follow-up:   [name] was discharged from [facility] in fair condition.  At the hospital follow up visit please address   1) Acute on chronic anemia on anticoagulation Patient presented w Hb 5.3, down from baseline of 9. FOBT negative x2, but patient with no other evidence of active bleeding or hemolysis. Has known history of severe diverticulosis on colonoscopy. Suspect chronic slow GI bleed cause of sx. Hb stabilized with transfusion.  Was on warfarin prior to admission in setting of A fib and history of multiple suspected embolic strokes. INR 3.8 on admission. Warfarin held due to concern for bleeding >> concern for stroke while off of anticoagulation. She should have a  CBC at her outpatient follow up. If Hb is stable, can consider whether or not to add back warfarin. She would not be a great candidate for NOA such as apxiciban as medication not reversible and patient with strongly suspected GI bleed.  She has outpatient follow-up with GI (Dr. Jarold Motto) 05/22/12. Patient refused colonoscopy as inpatient but said she would consider as outpatient. May not be prudent if her Hb remains stable given multiple medical comorbidities.   Follow-up Appointments:     Follow-up Information    Follow up with SAWHNEY,MEGHA, MD. On 05/23/2012. (10:00am)    Contact information:   [facility] [address] [place] [address] [phone]       Follow up with Sheryn Bison, MD. On 05/22/2012. (10:30am)    Contact information:   [address] [place] [address] [phone]         Discharge Orders    Future Appointments: Provider: Department: Dept Phone: Center:   05/22/2012 10:45 AM Mardella Layman, MD [facility] [phone] [facility]   05/23/2012 10:00 AM Elyse Jarvis, MD [facility] [phone] [facility]   06/20/2012 9:30 AM [facility] [place] [employer] Main [facility] [place]) [phone] [facility]  Future Orders Please Complete By Expires   Diet - low sodium heart healthy      Increase activity slowly      Call MD for:  persistant nausea and vomiting      Call MD for:  severe uncontrolled pain      Call MD for:  difficulty breathing, headache or visual disturbances      Call MD for:  persistant dizziness or light-headedness      Call MD for:  extreme fatigue         Consultations: Treatment Team:  Rounding Lbcardiology, MD  Procedures Performed:  Ct Head Wo Contrast  05/14/2012  *RADIOLOGY REPORT*  Clinical Data: Dizziness, post fall, history of congestive heart failure and atrial fibrillation, on Coumadin  CT HEAD WITHOUT CONTRAST  Technique:  Contiguous axial images  were obtained from the base of the skull through the vertex without contrast.  Comparison: 10/18/2010; 10/16/2010; 01/25/2008  Findings:  Grossly unchanged mild diffuse atrophy and geographic encephalomalacia within the right frontal (image 16) and parietal lobes (image 22).  Scattered periventricular hypodensities, right greater than left, are grossly unchanged.  Given background baseline parenchymal abnormalities, there is no CT evidence of acute large territory infarct.  No intraparenchymal or extra-axial mass or hemorrhage.  The ventricles and basilar cisterns are unchanged in size and configuration with unchanged mild saccular dilatation of the right lateral ventricle.  No midline shift. Vascular calcifications within the bilateral carotid siphons.  Scattered minimal opacification of the right anterior ethmoidal air cells. Unchanged sub centimeter osteoid osteoma within the right anterior ethmoidal air cells.  The remaining paranasal sinuses and mastoid air cells are normally aerated.  No air fluid levels. Regional soft tissues are normal.  No displaced calvarial fracture.  IMPRESSION: 1.  No acute intracranial process. 2.  Unchanged atrophy, microvascular ischemic disease and remote infarcts within the right frontal and parietal lobes.   Original Report Authenticated By: Tacey Ruiz, MD    Dg Chest Portable 1 View  05/14/2012  *RADIOLOGY REPORT*  Clinical Data: Dizziness.  Recent falls.  PORTABLE CHEST - 1 VIEW  Comparison: 03/31/2011 radiographs.  Findings: 1025 hours.  Left subclavian pacemaker lead is unchanged at the right ventricular apex.  There is stable cardiomegaly and aortic atherosclerosis.  There is stable chronic lung disease with hyperinflation and central airway thickening.  No superimposed edema, airspace disease or pleural effusion is identified. Multiple telemetry leads overlie the chest.  IMPRESSION: Stable cardiomegaly and chronic lung disease.  No acute cardiopulmonary process.    Original Report Authenticated By: Carey Bullocks, M.D.     2D Echo:  Study Conclusions - Left ventricle: Systolic function was normal. The estimated ejection fraction was in the range of 50% to 55%. - Aortic valve: Mild regurgitation. - Mitral valve: Mild regurgitation. - Left atrium: The atrium was moderately dilated. - Right atrium: The atrium was mildly dilated. - Tricuspid valve: Moderate regurgitation. - Pulmonary arteries: Systolic pressure was moderately increased. PA peak pressure: 57mm Hg (S).  Admission HPI:  [name] is an 79 yo W with PMH of diastolic congestive heart failure, atrial fibrillation on chronic Coumadin, sick sinus syndrome status post pacemaker in 2000, hypertension, diabetes, stroke, DJD presents for fall/dizziness.  Patient was in usual state of health until Saturday afternoon. Patient's daughter reports that she was talking to mother on the phone and then there was silence. Patient reports that she was answering the phone when she felt weak in the knees, dizzy, and fell to  the floor. She denies LOC. Patient reports that floor was carpeted. She cannot remember if she fell backward or hit her head. No bleeding or joint pains after fall. She had another episode of dizziness weakness causing her to fall several hours later, and two additional episodes today.  She says she has felt a little nauseated over the past couple of days but otherwise well. She lives alone and performs ADLs independently.  Patient denies any visual changes, confusion, headache, slurred speech, shortness of breath, chest pain, chest tightness, numbness, abdominal pain, hematochezia, melena, diarrhea, constipation, hematuria, dysuria.   Hospital Course by problem list:  1. Acute on chronic Anemia:  Patient presented with several falls 3 days prior to admission preceded by dizziness and weakness; no loss of consciousness. Baseline Hb is 9, on admission her Hb was measured to be 5.3. This value  decreased further to 4.6 after administration of IVF bolus. Patient with history of iron deficiency but was unable to tolerate supplementation at home due to difficulty swallowing large pills. FOBT was negative on admission and no gross blood in stool. No other obvious source of blood loss (no hematuria, hemoptysis, hematemesis). CT head negative for intracranial bleed. No back pain to suggest retroperitoneal bleed. She is on chronic anticoagulation w warfarin in setting of A. Fib w multiple prior CVAs. INR supratherapeutic at 3.8 on admission and warfarin was held. She was transfused 3 U of blood overnight and her Hb recovered to 8.6 the following morning. Repeat FOBT and rectal exam were both negative for any blood. Because her Hb recovered and she was hemodynamically stable, we did not reverse her anticoagulation with vitamin K or FFP due to potential risk of precipitation embolic stroke given her history. On hospital day 2, Hb remained stable at 8.4. Pre-transfusion anemia panel revealed iron of 18 with saturation 7% and ferritin 14. No evidence of hemolysis with normal LDH/haptoglobin and no fragmented RBCs on technical review of smear. She received 1g of IV iron which she tolerated well.  Ultmiately suspect chronic, slow GI bleed as result of anemia. Has known severe diverticulosis by endoscopy by Dr Jarold Motto in 2011. Remained without any gross or occult GI bleeding here. Initially, patient refused colonoscopy or any invasive work up of potential GI bleed. On day of discharge, she said she would be interested in following up with Dr. Jarold Motto to see if she needed any further GI work up. She has an appointment with him for 05/22/12. PT saw pt on day of discharge. She had recovered strength and ambulating well without dizziness or weakness with her cane from home. PT recommended home health PT on discharge and patient was agreeable.     2. Atrial Fibrillation w RVR  Patient with chronic A. Fib controlled  on metoprolol at home, also w h/o sick sinus syndrome. Presented in RVR with rate in 140s. Diffuse ST segment depression on EKG related to demand in setting of RVR (negative troponins). Was started on diltiazem drip in ED. Cardiology saw patient in ED (Dr. Jens Som) who recommended continuing diltiazem drip. Patient had several hypotensive episodes on diltiazem which responded to 500cc fluid boluses. Suspect RVR precipitated by acute anemia. Had resolved the morning following admission after blood transfusions and diltiazem drip was stopped. She was restarted on home dose oral metoprolol and remained in rate-controlled A. Fib for the remainder of admission.  Warfarin held as INR was supratherapeutic (3.8) and patient anemic on admission and concern for bleeding risk . On morning of discharge,  INR was 2.0.   We discussed at length options for anticoagulation for patient moving forward. Did consider NOA (new oral anticoagulants) such as apcixiban, but do not want to start in patient with suspected bleed when there is no way to reverse effects. Decided to hold warfarin discharge and defer reinitiation of anticoagulation to PCP follow up in 1 week if Hb is stable One-year stroke risk is 8%, but believe risk of rebleeding while anticoagulated is greater threat to patient. Explained the risks and benefits of holding warfarin with patient and her daughter [name] who were in agreement to hold it for now.   3. Chronic Diastolic Heart Failure:  Last chocardiogram in 09/2009, showed EF 55-60%. On admission, proBNP was slightly elevated at 996 (baseline 300's). Repeat echo shows normal EF. Heart failure remained well compensated during admission. Lisinopril held during hospitalization as pt w some hypotension. Restarted on discharge as patient normotensive.  4. DM II:  HbA1c 04/09/12 was 6.4. Diet controlled at home. SSI-sensitive scale provided while in hospital. No hypoglycemic events.  5. History of CVA/TIAs  Prior  R MCA stroke in 2000. Per daughters, several "ministrokes" since then. On ASA 81mg  daily at home. No focal neurological deficits on exam, no bleeding or obvious ischemia on head CT. Do not suspect acute CVA. Warfarin held on discharge; stroke risk discussed w patient and family per #2.  6. Stress incontinence   Continues to leak urine. UA with 0-2 WBCs and squamous cells, culture with multiple bacterial morphologies, non speciated, likely contaminated. Continued vesicare.    Discharge Vitals:  BP 125/42  Pulse 66  Temp 98.2 F (36.8 C) (Oral)  Resp 21  Ht 5\' 4"  (1.626 m)  Wt 124 lb 9 oz (56.5 kg)  BMI 21.38 kg/m2  SpO2 100%  Discharge Labs:  Results for orders placed during the hospital encounter of 05/14/12 (from the past 24 hour(s))  GLUCOSE, CAPILLARY     Status: Abnormal   Collection Time   05/15/12  3:54 PM      Component Value Range   Glucose-Capillary 209 (*) 70 - 99 mg/dL  GLUCOSE, CAPILLARY     Status: Abnormal   Collection Time   05/15/12  8:59 PM      Component Value Range   Glucose-Capillary 115 (*) 70 - 99 mg/dL   Comment 1 Notify RN    PROTIME-INR     Status: Abnormal   Collection Time   05/16/12  5:05 AM      Component Value Range   Prothrombin Time 22.3 (*) 11.6 - 15.2 seconds   INR 2.05 (*) 0.00 - 1.49  CBC WITH DIFFERENTIAL     Status: Abnormal   Collection Time   05/16/12  5:05 AM      Component Value Range   WBC 7.5  4.0 - 10.5 K/uL   RBC 2.76 (*) 3.87 - 5.11 MIL/uL   Hemoglobin 8.4 (*) 12.0 - 15.0 g/dL   HCT 78.2 (*) 95.6 - 21.3 %   MCV 88.0  78.0 - 100.0 fL   MCH 30.4  26.0 - 34.0 pg   MCHC 34.6  30.0 - 36.0 g/dL   RDW 08.6 (*) 57.8 - 46.9 %   Platelets 119 (*) 150 - 400 K/uL   Neutrophils Relative 65  43 - 77 %   Neutro Abs 4.9  1.7 - 7.7 K/uL   Lymphocytes Relative 25  12 - 46 %   Lymphs Abs 1.9  0.7 - 4.0 K/uL  Monocytes Relative 8  3 - 12 %   Monocytes Absolute 0.6  0.1 - 1.0 K/uL   Eosinophils Relative 2  0 - 5 %   Eosinophils Absolute 0.1   0.0 - 0.7 K/uL   Basophils Relative 0  0 - 1 %   Basophils Absolute 0.0  0.0 - 0.1 K/uL  BASIC METABOLIC PANEL     Status: Abnormal   Collection Time   05/16/12  5:05 AM      Component Value Range   Sodium 141  135 - 145 mEq/L   Potassium 4.2  3.5 - 5.1 mEq/L   Chloride 113 (*) 96 - 112 mEq/L   CO2 22  19 - 32 mEq/L   Glucose, Bld 113 (*) 70 - 99 mg/dL   BUN 27 (*) 6 - 23 mg/dL   Creatinine, Ser 1.61  0.50 - 1.10 mg/dL   Calcium 8.5  8.4 - 09.6 mg/dL   GFR calc non Af Amer 54 (*) >90 mL/min   GFR calc Af Amer 63 (*) >90 mL/min  GLUCOSE, CAPILLARY     Status: Normal   Collection Time   05/16/12  8:07 AM      Component Value Range   Glucose-Capillary 82  70 - 99 mg/dL   Comment 1 Notify RN    GLUCOSE, CAPILLARY     Status: Normal   Collection Time   05/16/12 11:55 AM      Component Value Range   Glucose-Capillary 91  70 - 99 mg/dL   Comment 1 Notify RN      Signed: Bronson Curb 05/16/2012, 2:10 PM   Time Spent on Discharge: 40 min Services Ordered on Discharge: home health PT Equipment Ordered on Discharge: none

## 2012-05-16 NOTE — Progress Notes (Signed)
ANTICOAGULATION CONSULT NOTE - Follow Up Consult  Pharmacy Consult for Coumadin Indication: atrial fibrillation  No Known Allergies  Patient Measurements: Height: 5\' 4"  (162.6 cm) Weight: 124 lb 9 oz (56.5 kg) IBW/kg (Calculated) : 54.7   Vital Signs: Temp: 98.2 F (36.8 C) (01/29 0727) Temp src: Oral (01/29 0727) BP: 121/58 mmHg (01/29 0727) Pulse Rate: 78  (01/29 0953)  Labs:  Basename 05/16/12 0505 05/15/12 0810 05/15/12 0153 05/14/12 1026  HGB 8.4* 8.6* -- --  HCT 24.3* 24.4* 19.1* --  PLT 119* 108* 106* --  APTT -- -- -- --  LABPROT 22.3* -- 34.3* 35.4*  INR 2.05* -- 3.67* 3.83*  HEPARINUNFRC -- -- -- --  CREATININE 0.94 -- -- 1.08  CKTOTAL -- -- -- --  CKMB -- -- -- --  TROPONINI -- -- -- <0.30    Estimated Creatinine Clearance: 38.5 ml/min (by C-G formula based on Cr of 0.94).   Medications:  Scheduled:    . darifenacin  7.5 mg Oral Daily  . insulin aspart  0-9 Units Subcutaneous TID WC  . [COMPLETED] iron dextran (INFED/DEXFERRUM) 1000 MG IVPB  1,000 mg Intravenous Once  . metoprolol  100 mg Oral BID  . sodium chloride  3 mL Intravenous Q12H    Assessment: 79 year old female presented with dizziness and a fall. She is on chronic coumadin for atrial fibrillation and presented with a supra-therapeutic INR (3.83). INR is now therapeutic at 2.05.  However, given her low hemoglobin on admission (dropped to 4.6) and unknown cause, will not restart coumadin at this time per internal med team.  CBC is now stable status post blood transfusions. Renal function remains stable.   Goal of Therapy:  Monitor platelets by anticoagulation protocol: Yes   Plan:  Off coumadin until outpatient follow-up  Micheline Chapman 05/16/2012,10:31 AM   I have reviewed and discussed the above patient with Lurena Joiner and agree with her assessment and plan. Per discussions with the internal medicine team, we will hold off on coumadin until the patient is evaluated in the internal  medicine clinic this following week. Pt had markedly low Hgb this visit that required several units of blood. Risks and benefits were discussed including risk of stroke off coumadin, risk off another episode of low Hgb on warfarin, as well as risk of no reversal agent if patient was started on a new agent such as apixaban.   Thank you for the consult,   Abran Duke, PharmD Clinical Pharmacist Phone: [phone] Pager: [phone] 05/16/2012 10:42 AM

## 2012-05-16 NOTE — Progress Notes (Signed)
Patient is for discharge to home.  [name] was called and notified of the discharge.  She will be here at 1600 to pick up her mother.  Patient is aware of the discharge.  She is alert and oriented and ambulated in the hallway with PT using a cane.  She tolerated it well.

## 2012-05-16 NOTE — Care Management Note (Signed)
    Page 1 of 1   05/16/2012     12:49:55 PM   CARE MANAGEMENT NOTE 05/16/2012  Patient:  [name], [name]   Account Number:  [id number]  Date Initiated:  05/16/2012  Documentation initiated by:  [name]  Subjective/Objective Assessment:   79 yr-old female adm with dx of anemia; lives alone, has h/o home health with [facility]     DC Planning Services  CM consult      [facility] Choice  HOME HEALTH   Choice offered to / List presented to:  C-1 Patient       HH arranged  HH-2 PT      HH agency  [facility]   Status of service:  Completed, signed off  Discharge Disposition:  HOME W HOME HEALTH SERVICES  Per UR Regulation:  Reviewed for med. necessity/level of care/duration of stay  Comments:  05/16/12 1132 Holly Melin RN BSN MSN CCM PT recommends home therapy, discussed with pt who agrees. Provided pt with agency list, referral made per choice.

## 2012-05-16 NOTE — Addendum Note (Signed)
Addended by: Neomia Dear on: 05/16/2012 05:45 PM   Modules accepted: Orders

## 2012-05-16 NOTE — Progress Notes (Signed)
Subjective: Patient feeling well this morning without dizziness. Has not gotten out of bed yet to test her strength. No bloody BM. Hb stable at 8.4 today. Received IV iron yesterday. Denies CP, SOB, N/V, dizziness, abdominal pain, diarrhea.   Objective: Vital signs in last 24 hours: Filed Vitals:   05/15/12 2328 05/16/12 0400 05/16/12 0457 05/16/12 0727  BP: 107/44 115/51  121/58  Pulse: 65 64  67  Temp: 98.3 F (36.8 C)   98.2 F (36.8 C)  TempSrc: Oral   Oral  Resp: 24 21  16   Height:      Weight:   124 lb 9 oz (56.5 kg)   SpO2: 100% 100%  100%   Weight change: 2 lb 6.8 oz (1.1 kg)  Intake/Output Summary (Last 24 hours) at 05/16/12 0834 Last data filed at 05/16/12 0732  Gross per 24 hour  Intake    810 ml  Output   1601 ml  Net   -791 ml  Vitals reviewed.  General: thin female laying in bed, alert and oriented. HEENT: PERRL w arcus senilis,  EOMI, MMM of OP  Cardiac: Rate and rhythm were regular on my exam, w HR in 70s; no rubs, murmurs or gallops  Pulm: no increased WOB, fine rales in bilateral bases, good air movement  Abd: soft, nontender, nondistended, normoactive bowel sounds  Ext: warm and well perfused, no pedal edema, 2+ DP pulses, good capillary refill  Neuro: alert and oriented X3, cranial nerves II-XII intact, 5/5 strength in face and bilateral upper and lower extremities, sensation intact in upper and lower extremities.    Lab Results: Basic Metabolic Panel:  Lab 05/16/12 1610 05/14/12 1026  NA 141 137  K 4.2 4.0  CL 113* 106  CO2 22 19  GLUCOSE 113* 225*  BUN 27* 58*  CREATININE 0.94 1.08  CALCIUM 8.5 8.4  MG -- --  PHOS -- --   Liver Function Tests:  Lab 05/14/12 1026  AST 15  ALT 7  ALKPHOS 65  BILITOT 0.4  PROT 6.2  ALBUMIN 2.9*   CBC:  Lab 05/16/12 0505 05/15/12 0810 05/14/12 1026  WBC 7.5 8.1 --  NEUTROABS 4.9 -- 6.5  HGB 8.4* 8.6* --  HCT 24.3* 24.4* --  MCV 88.0 85.9 --  PLT 119* 108* --   Cardiac Enzymes:  Lab 05/14/12  1026  CKTOTAL --  CKMB --  CKMBINDEX --  TROPONINI <0.30   BNP:  Lab 05/14/12 1026  PROBNP 996.2*   CBG:  Lab 05/16/12 0807 05/15/12 2059 05/15/12 1554 05/15/12 1224 05/15/12 0810 05/14/12 2142  GLUCAP 82 115* 209* 143* 110* 168*   Thyroid Function Tests:  Lab 05/14/12 1654  TSH 1.123  T4TOTAL --  FREET4 --  T3FREE --  THYROIDAB --   Coagulation:  Lab 05/16/12 0505 05/15/12 0153 05/14/12 1026  LABPROT 22.3* 34.3* 35.4*  INR 2.05* 3.67* 3.83*   Anemia Panel:  Lab 05/14/12 1654  VITAMINB12 249  FOLATE 16.0  FERRITIN 14  TIBC 262  IRON 18*  RETICCTPCT 3.9*   Urinalysis:  Lab 05/14/12 1742  COLORURINE YELLOW  LABSPEC 1.014  PHURINE 5.5  GLUCOSEU NEGATIVE  HGBUR NEGATIVE  BILIRUBINUR NEGATIVE  KETONESUR NEGATIVE  PROTEINUR NEGATIVE  UROBILINOGEN 0.2  NITRITE NEGATIVE  LEUKOCYTESUR SMALL*   Micro Results: Recent Results (from the past 240 hour(s))  MRSA PCR SCREENING     Status: Normal   Collection Time   05/14/12  4:05 PM      Component Value  Range Status Comment   MRSA by PCR NEGATIVE  NEGATIVE Final   URINE CULTURE     Status: Normal   Collection Time   05/14/12  5:43 PM      Component Value Range Status Comment   Specimen Description URINE, CLEAN CATCH   Final    Special Requests NONE   Final    Culture  Setup Time 05/14/2012 19:07   Final    Colony Count 70,000 COLONIES/ML   Final    Culture     Final    Value: Multiple bacterial morphotypes present, none predominant. Suggest appropriate recollection if clinically indicated.   Report Status 05/15/2012 FINAL   Final    Studies/Results: Ct Head Wo Contrast  05/14/2012  *RADIOLOGY REPORT*  Clinical Data: Dizziness, post fall, history of congestive heart failure and atrial fibrillation, on Coumadin  CT HEAD WITHOUT CONTRAST  Technique:  Contiguous axial images were obtained from the base of the skull through the vertex without contrast.  Comparison: 10/18/2010; 10/16/2010; 01/25/2008  Findings:   Grossly unchanged mild diffuse atrophy and geographic encephalomalacia within the right frontal (image 16) and parietal lobes (image 22).  Scattered periventricular hypodensities, right greater than left, are grossly unchanged.  Given background baseline parenchymal abnormalities, there is no CT evidence of acute large territory infarct.  No intraparenchymal or extra-axial mass or hemorrhage.  The ventricles and basilar cisterns are unchanged in size and configuration with unchanged mild saccular dilatation of the right lateral ventricle.  No midline shift. Vascular calcifications within the bilateral carotid siphons.  Scattered minimal opacification of the right anterior ethmoidal air cells. Unchanged sub centimeter osteoid osteoma within the right anterior ethmoidal air cells.  The remaining paranasal sinuses and mastoid air cells are normally aerated.  No air fluid levels. Regional soft tissues are normal.  No displaced calvarial fracture.  IMPRESSION: 1.  No acute intracranial process. 2.  Unchanged atrophy, microvascular ischemic disease and remote infarcts within the right frontal and parietal lobes.   Original Report Authenticated By: Tacey Ruiz, MD    Dg Chest Portable 1 View  05/14/2012  *RADIOLOGY REPORT*  Clinical Data: Dizziness.  Recent falls.  PORTABLE CHEST - 1 VIEW  Comparison: 03/31/2011 radiographs.  Findings: 1025 hours.  Left subclavian pacemaker lead is unchanged at the right ventricular apex.  There is stable cardiomegaly and aortic atherosclerosis.  There is stable chronic lung disease with hyperinflation and central airway thickening.  No superimposed edema, airspace disease or pleural effusion is identified. Multiple telemetry leads overlie the chest.  IMPRESSION: Stable cardiomegaly and chronic lung disease.  No acute cardiopulmonary process.   Original Report Authenticated By: Carey Bullocks, M.D.    Medications: I have reviewed the patient's current medications. Scheduled Meds:      . darifenacin  7.5 mg Oral Daily  . insulin aspart  0-9 Units Subcutaneous TID WC  . metoprolol  100 mg Oral BID  . sodium chloride  3 mL Intravenous Q12H   Continuous Infusions:  PRN Meds:.ondansetron, ondansetron  Assessment/Plan: [name] is an 79 yo W with PMH of diastolic congestive heart failure, atrial fibrillation on chronic Coumadin, sick sinus syndrome status post pacemaker in 2000, hypertension, diabetes, and CVA/TIAs presenting with symptomatic anemia and atrial fibrillation with RVR.   1. Acute on chronic Anemia:  Baseline Hb is 9. Patient w symptomatic anemia with Hb on admission 5.3-->4.6, recovered to 8.6 s/p transfusion of 3U pRBCs. Received IV iron therapy yesterday. Hb stable today at 8.4. ultmiately suspect  chronic, slow GI bleed as result of anemia. Has known diverticulosis. FOBT negative x2 here.  No evidence of hemolysis, with normal LDH on pretransfusion labs and smear review without evidence of hemolysis. PT has seen pt, recommend home health PT She has f/u appoint w GI 05/22/12 (Dr. Jarold Motto)   2. Atrial Fibrillation w RVR RVR that was present on admission(140s) has resolved on diltiazem drip, which was discontinued morning after admission. Suspect RVR exacerbated by acute anemia. She also has a history of sick sinus syndrome with pacemaker placement in 2000.  Home metoprolol po started back yesterday. Rate has been controlled in 60s.  Warfarin held as INR supratherapeutic (3.8) and patient anemic on admission. INR this morning is 2.0 - Stable on metoprolol - Holding warfarin and other anticoagulants at this time due to concern for bleeding risk. Discussed NOA such as apcixiban, but do not want to start in patient with suspected bleed when there is no way to reverse effects. Will defer reinitiation of anticoagulation to PCP follow up in 1 week if Hb is stable One-year stroke risk is 8%, but believe risk of rebleeding while anticoagulated is greater threat to  patient.    3. Hypotension:  Resolved. Suspect hypovolemia, anemia, atrial fibrillation w RVR contributed. BUN:Cr wass 58:1.08 on admission, suggesting volume depletion. BP improved w IVF. Today BUN:Cr is 27:0.94.  4. Chronic Diastolic Heart Failure:  Last 2D echocardiogram is in 09/2009, EF 55-60%. ProBNP is slightly elevated at 996 (baseline 300's). Repeat echo shows normal EF. Well compensated. Lisinopril held on admission as pt w some hypotension.Will restart on discharge if stable  5. DM II:  HbA1c 04/09/12 was 6.4. Diet controlled at home.  - SSI-sensitive while in hospital.   6. History of CVA/TIAs  Prior R MCA stroke in 2000. Per daughters, several "ministrokes" since then. On ASA 81mg  daily at home. No focal neurological deficits on exam, no bleeding or obvious ischemia on head CT. Do not suspect acute CVA.   7. Stress incontinence w dysuria  Continues to leak urine. Occasional burning.  - UA with 0-2 WBCs, some contamination.  - Continue vesicare.   DVT ppx: supratherapeutic INR, SCDs     Dispo: Disposition is deferred at this time, awaiting improvement of current medical problems.  Anticipated discharge in approximately 1 day(s).   The patient does have a current PCP (KALIA-REYNOLDS, SHELLY, DO), therefore will be requiring OPC follow-up after discharge.   The patient does not have transportation limitations that hinder transportation to clinic appointments.  .Services Needed at time of discharge: Y = Yes, Blank = No PT:   OT:   RN:   Equipment:   Other:     LOS: 2 days   Bronson Curb 05/16/2012, 8:34 AM

## 2012-05-18 ENCOUNTER — Telehealth: Payer: Self-pay | Admitting: *Deleted

## 2012-05-18 LAB — TYPE AND SCREEN
Unit division: 0
Unit division: 0

## 2012-05-18 NOTE — Telephone Encounter (Signed)
lmom for pt to call back. Mailed her a letter informing her of cancellation and to see Dr Dorthula Rue as scheduled.

## 2012-05-18 NOTE — Telephone Encounter (Signed)
Message copied by Florene Glen on Fri May 18, 2012  9:59 AM ------      Message from: [place], DAVID R      Created: Fri May 18, 2012  8:26 AM       This patient needs a primary care/ cardiology followup before she sees GI with her multiple medical problems.

## 2012-05-21 NOTE — Discharge Summary (Signed)
As noted by Dr. Heloise Beecham, patient was admitted with severe anemia which, although stool fecal occult blood tests were negative during this admission, is likely due to chronic GI blood loss.  Her hemoglobin improved with transfusion, and she was also given IV iron replacement during this hospitalization.  We held her warfarin in the short term pending outpatient follow-up; at that time, the benefit of anticoagulation for stroke prophylaxis will need to be weighed against the potential risk in terms of possible GI bleeding and recurrent anemia.  If her hemoglobin remains stable, then careful resumption of anticoagulation could be considered with close follow-up.  She has an appointment with her gastroenterologist Dr. Jarold Motto on 05/22/12, and an evaluation of her GI tract if she agrees may help clarify the source of her anemia and the risk of resuming anticoagulation.

## 2012-05-21 NOTE — Telephone Encounter (Signed)
Spoke with pt's daughter [name] who stated pt got our letter, but she was told today by phone that we will see the pt on 05/24/12. [name] [phone] Dr Heloise Beecham called this am to r/s her appt and was given 05/24/12.

## 2012-05-22 ENCOUNTER — Ambulatory Visit: Payer: Medicare HMO | Admitting: Gastroenterology

## 2012-05-22 ENCOUNTER — Telehealth: Payer: Self-pay | Admitting: *Deleted

## 2012-05-22 NOTE — Telephone Encounter (Signed)
Message copied by Florene Glen on Tue May 22, 2012  9:53 AM ------      Message from: Ignacia Palma      Created: Mon May 21, 2012  5:04 PM       I apologize, my understanding of his message was that he wanted her to be seen by PCP first and wanted [place] to do something about it, which is why I rescheduled appt. You can cancel the 2/6 appointment if that would also be inappropriate, but she would like to speak with Dr. Jarold Motto at some point in the future.      Thanks,       Bronson Curb                   ----- Message -----         From: Linna Hoff, RN         Sent: 05/21/2012   3:08 PM           To: Bronson Curb, MD            Dr Heloise Beecham, Dr Jarold Motto did not want to see the pt until she was stable, so that's why he asked me to cancel our appt. He also felt she should be seen by cardiology also. Usually, if over 27 years old, he scopes only if he has to and with her co morbidities she would probably need to be done at the hospital. Thanks. Graciella Freer, RN for Dr Jarold Motto  [phone]

## 2012-05-22 NOTE — Telephone Encounter (Signed)
Dr. Thad Ranger at [facility]  in internal medicine is her doctor and should follow up with this patient.  If she needs further transfusions they should do this.  If they feel followup colonoscopy is indicated I will be glad to see her.

## 2012-05-22 NOTE — Telephone Encounter (Signed)
She needs to establish with a primary care physician for followup and evaluation and therapy. He just got out of the hospital and knees postop followup.  She needs a primary care physician.

## 2012-05-22 NOTE — Telephone Encounter (Signed)
Spoke with pt's daughter [name] to inform her we will r/s pt until she is more stable and far as seeing if she needs more blood transfusions, cardiology referrals, etc ; r/s for 2/13/1030am with Dr Jarold Motto. [name] stated understanding.

## 2012-05-23 ENCOUNTER — Ambulatory Visit (INDEPENDENT_AMBULATORY_CARE_PROVIDER_SITE_OTHER): Payer: Medicare HMO | Admitting: Internal Medicine

## 2012-05-23 VITALS — BP 155/71 | HR 72 | Temp 97.6°F | Ht 64.0 in | Wt 131.4 lb

## 2012-05-23 DIAGNOSIS — E119 Type 2 diabetes mellitus without complications: Secondary | ICD-10-CM

## 2012-05-23 DIAGNOSIS — K5900 Constipation, unspecified: Secondary | ICD-10-CM

## 2012-05-23 DIAGNOSIS — D649 Anemia, unspecified: Secondary | ICD-10-CM

## 2012-05-23 DIAGNOSIS — Z7901 Long term (current) use of anticoagulants: Secondary | ICD-10-CM

## 2012-05-23 DIAGNOSIS — I10 Essential (primary) hypertension: Secondary | ICD-10-CM

## 2012-05-23 DIAGNOSIS — E118 Type 2 diabetes mellitus with unspecified complications: Secondary | ICD-10-CM

## 2012-05-23 DIAGNOSIS — I4891 Unspecified atrial fibrillation: Secondary | ICD-10-CM

## 2012-05-23 LAB — CBC WITH DIFFERENTIAL/PLATELET
Basophils Relative: 0 % (ref 0–1)
HCT: 26.7 % — ABNORMAL LOW (ref 36.0–46.0)
Hemoglobin: 8.8 g/dL — ABNORMAL LOW (ref 12.0–15.0)
Lymphocytes Relative: 34 % (ref 12–46)
MCHC: 33 g/dL (ref 30.0–36.0)
Monocytes Absolute: 0.6 10*3/uL (ref 0.1–1.0)
Monocytes Relative: 10 % (ref 3–12)
Neutro Abs: 3.4 10*3/uL (ref 1.7–7.7)

## 2012-05-23 LAB — PROTIME-INR: INR: 0.96 (ref <1.50)

## 2012-05-23 LAB — GLUCOSE, CAPILLARY: Glucose-Capillary: 95 mg/dL (ref 70–99)

## 2012-05-23 MED ORDER — GLUCOSE BLOOD VI STRP: ORAL_STRIP | Status: DC

## 2012-05-23 MED ORDER — DOCUSATE SODIUM 100 MG PO CAPS
100.0000 mg | ORAL_CAPSULE | Freq: Every day | ORAL | Status: DC | PRN
Start: 2012-05-23 — End: 2012-10-29

## 2012-05-23 MED ORDER — FERROUS SULFATE 300 (60 FE) MG/5ML PO SYRP: 300.0000 mg | ORAL_SOLUTION | Freq: Every day | ORAL | Status: DC

## 2012-05-23 NOTE — Assessment & Plan Note (Signed)
Recent A1c was 6.4 about a month ago. Continue to monitor her on diet control. - She was given a prescription for glucose test strips.

## 2012-05-23 NOTE — Patient Instructions (Addendum)
General Instructions: Please schedule a follow up appointment in 1 month . Please bring your medication bottles with your next appointment. Please take your medicines as prescribed. I will call you with your lab results if anything will be abnormal.    Treatment Goals:  Goals (1 Years of Data) as of 05/23/2012          As of Today 05/16/12 05/16/12 05/16/12 05/16/12     Blood Pressure    . Blood Pressure < 140/90  155/71 125/42 116/48 121/58 115/51     Diet    . Have 3 meals a day           Result Component    . HEMOGLOBIN A1C < 7.0          . LDL CALC < 100            Progress Toward Treatment Goals:  Treatment Goal 05/23/2012  Hemoglobin A1C at goal  Blood pressure deteriorated    Self Care Goals & Plans:  Self Care Goal 05/23/2012  Manage my medications take my medicines as prescribed; refill my medications on time  Monitor my health keep track of my blood glucose; check my feet daily  Eat healthy foods eat foods that are low in salt  Be physically active (No Data)    Home Blood Glucose Monitoring 05/23/2012  Check my blood sugar -  When to check my blood sugar before meals     Care Management & Community Referrals:  Referral 05/23/2012  Referrals made for care management support none needed

## 2012-05-23 NOTE — Assessment & Plan Note (Signed)
Hospital followup for acute on chronic anemia likely iron deficiency with iron of 18 and ferritin of 14 in the setting of not being on any iron supplementation. She presented to the hospital with hemoglobin of 5.3 and received 2 units of blood after which her hemoglobin stabilized to 8.6. She refused GI studies, endoscopy/colonoscopy as an inpatient. Her drop in Hb was attributed to chronic slow GI bleed( diverticulosis seen on colonoscopy form 2011). Patient currently denies any symptoms of dizziness, shortness of breath or new fall.She had an appointment with Dr. Jarold Motto yesterday but her appointment was rescheduled for March 2014. She was afraid of taking iron pills because of their size and constipation but is feeling to take liquid iron preparation. - Check Stat CBC - Start her on liquid iron preparation - Colace for constipation , PRN - Keep up with her appt with Dr. Jarold Motto, next month.  - Continue to hold coumadin for now - RTC in 1 month for CBC recheck. Call the clinic before if she notices blood in her stool or identifies any other source of bleed.   Update: Her Hb was stable at 8.8 . Continue to monitor for now.

## 2012-05-23 NOTE — Assessment & Plan Note (Signed)
Coumadin was held during the hospitalization with hemoglobin of 5.3 and suspected chronic slow GI bleed. FOBT x2 was negative . I am not sure what caused her to have hemoglobin of 5.3 but with FOBT x2 negative, GI bleed is less likely.  We discussed risks vs benefits of being on Coumadin. Continuing Coumadin puts the patient at risk of bleed including intracranial hemorrhage, which is more life threatening and of course falls. Discontinuing coumadin puts her at risk of embolic stroke from atrial fibrillation ( 8% in a year). After lengthy discussion with the family we decided to hold Coumadin for now. In my opinion we may consider checking CBC again in one month. Also patient has an appointment with GI in a month when she may decide to undergocolonoscopy and endoscopy again. If she undergoes the studies and everything is normal ( studies and  hemoglobin ),we may consider putting her back on Coumadin at some point. But for now, mutual decision with the family was made to hold the Coumadin. Patient and her daughter are in agreement. RTC in 1 month for CBC recheck.

## 2012-05-23 NOTE — Assessment & Plan Note (Signed)
Blood pressure mildly elevated. Reviewing the trend it has been very well controlled in the past. Continue to monitor for now.  BP Readings from Last 3 Encounters:  05/23/12 155/71  05/16/12 125/42  04/09/12 137/70    Lab Results  Component Value Date   NA 141 05/16/2012   K 4.2 05/16/2012   CREATININE 0.94 05/16/2012

## 2012-05-23 NOTE — Progress Notes (Signed)
Subjective:   Patient ID: [name] female   DOB: 12-Oct-1925 79 y.o.   MRN: [id number]  HPI: 79 year old woman with past medical history significant for  sick sinus syndrome status post pacemaker, atrial fibrillation with RVR on long term anticoagulation ( held with hospital d/c) ,iron deficiency anemia with iron of 18 and ferritin of 14 from 01/14 presents to the clinic for a hospital followup.  She is accompanied by her daughter.  Patient was recently hospitalized through 05/14/2012 to 05/16/2012 for dizziness and falls when her hemoglobin was found to be 5.3 at presentation. No clear-cut bleeding source could be identified in the setting of FOBT x2 negative. Patient was noted to have diverticulosis on colonoscopy done in 2011 and a drop in hemoglobin was attributed to possibly internal GI bleed. Patient refused colonoscopy as inpatient. She had an appointment with Dr. Jarold Motto as an outpatient yesterday but patient 's daughter reported that he she received call from the GI clinic and the appointment was rescheduled for March 2014.  With the clinic visit today- patient reports feeling much better -denies any new falls. Also denies any complaints of dizziness, shortness of breath, abdominal pain, nausea, vomiting, noticing any blood in the stools or any other source of bleeding.   Past Medical History  Diagnosis Date  . Weight loss, unintentional 2010-2011    8/08-8/10 138-146 lbs, between 8/10-10/11 lost 18 lbs., work-up negative  . Incontinence overflow, stress female     on vesicare  . History of sick sinus syndrome s/p pacemaker Dr. [name] 2000  . Atrial fibrillation     on coumadin followed by Dr. Alexandria Lodge  . History of vaginal bleeding 05/2005    nagative endometrial biopsy  . Restrictive lung disease 1996    PFT's showed mild disease  . Anemia     Iron deficiency, does not tolerate po  . CHF (congestive heart failure)     diastolic dysfunction  . Diabetes mellitus     type 2   diet controlled  . Hypertension     well controlled on 3 agents  . Osteoporosis   . Stroke 2002    R MCA, cardioembolic  . Allergic rhinitis   . Onychomycosis   . History of trichomonal urethritis   . Arthritis   . DJD (degenerative joint disease)   . Constipation   . Hx of hysterectomy   . Pacemaker    Family History  Problem Relation Age of Onset  . Diabetes Sister   . Heart disease     History   Social History  . Marital Status: Widowed    Spouse Name: N/A    Number of Children: 8  . Years of Education: N/A   Occupational History  . retired    Social History Main Topics  . Smoking status: Former Games developer  . Smokeless tobacco: Never Used  . Alcohol Use: 0.0 oz/week    0 drink(s) per week     Comment: occasional use  . Drug Use: No  . Sexually Active: Not on file   Other Topics Concern  . Not on file   Social History Narrative   [employer]-[name] home# [phone]# [phone] lives alone, but has 7, of 8, living children who check in on her.She is widowed.   Review of Systems: General: Denies fever, chills, diaphoresis, appetite change and fatigue. HEENT: Denies photophobia, eye pain, redness, hearing loss, ear pain, congestion, sore throat, rhinorrhea, sneezing, mouth sores, trouble swallowing, neck pain, neck stiffness and tinnitus. Respiratory: Denies  SOB, DOE, cough, chest tightness, and wheezing. Cardiovascular: Denies to chest pain, palpitations and leg swelling. Gastrointestinal: Denies nausea, vomiting, abdominal pain, diarrhea, constipation, blood in stool and abdominal distention. Genitourinary: Denies dysuria, urgency, frequency, hematuria, flank pain and difficulty urinating. Musculoskeletal: Denies myalgias, back pain, joint swelling, arthralgias and gait problem.  Skin: Denies pallor, rash and wound. Neurological: Denies dizziness, seizures, syncope, weakness, light-headedness, numbness and headaches. Hematological: Denies adenopathy,  easy bruising, personal or family bleeding history. Psychiatric/Behavioral: Denies suicidal ideation, mood changes, confusion, nervousness, sleep disturbance and agitation.    Current Outpatient Medications: Current Outpatient Prescriptions  Medication Sig Dispense Refill  . aspirin 81 MG tablet Take 1 tablet (81 mg total) by mouth daily.  30 tablet    . glucose blood test strip Use as instructed  100 each  12  . lisinopril (PRINIVIL,ZESTRIL) 40 MG tablet Take 1 tablet (40 mg total) by mouth daily.  30 tablet  5  . metoprolol (LOPRESSOR) 100 MG tablet Take 1 tablet (100 mg total) by mouth 2 (two) times daily.  60 tablet  5  . solifenacin (VESICARE) 10 MG tablet Take 1 tablet (10 mg total) by mouth daily.  30 tablet  5  . traMADol (ULTRAM) 50 MG tablet Take 1 tablet (50 mg total) by mouth every 6 (six) hours as needed for pain.  60 tablet  0    Allergies: No Known Allergies    Objective:   Physical Exam: Filed Vitals:   05/23/12 1035  BP: 155/71  Pulse: 72  Temp: 97.6 F (36.4 C)    General: Vital signs reviewed and noted. Well-developed, well-nourished, in no acute distress; alert, appropriate and cooperative throughout examination. Head: Normocephalic, atraumatic Lungs: Normal respiratory effort. Clear to auscultation BL without crackles or wheezes. Heart: RRR. S1 and S2 normal without gallop, 2/6 systolic murmur hear at the left sternal border, or rubs. Abdomen:BS normoactive. Soft, Nondistended, non-tender.  No masses or organomegaly. Extremities: No pretibial edema.     Assessment & Plan:

## 2012-05-24 ENCOUNTER — Ambulatory Visit: Payer: Medicare HMO | Admitting: Gastroenterology

## 2012-05-25 NOTE — Telephone Encounter (Signed)
See 05/22/12 encounter note.

## 2012-05-31 ENCOUNTER — Ambulatory Visit: Payer: Medicare HMO | Admitting: Gastroenterology

## 2012-06-04 ENCOUNTER — Encounter: Payer: Self-pay | Admitting: *Deleted

## 2012-06-05 ENCOUNTER — Ambulatory Visit: Payer: Medicare HMO | Admitting: Gastroenterology

## 2012-06-18 ENCOUNTER — Ambulatory Visit: Payer: Medicare HMO | Attending: Internal Medicine | Admitting: Physical Therapy

## 2012-06-20 ENCOUNTER — Other Ambulatory Visit: Payer: Self-pay | Admitting: Internal Medicine

## 2012-06-20 ENCOUNTER — Ambulatory Visit (INDEPENDENT_AMBULATORY_CARE_PROVIDER_SITE_OTHER): Payer: Medicare HMO | Admitting: *Deleted

## 2012-06-20 ENCOUNTER — Encounter: Payer: Self-pay | Admitting: Internal Medicine

## 2012-06-20 DIAGNOSIS — I4891 Unspecified atrial fibrillation: Secondary | ICD-10-CM

## 2012-06-20 LAB — PACEMAKER DEVICE OBSERVATION
BRDY-0002RV: 60 {beats}/min
BRDY-0004RV: 120 {beats}/min

## 2012-06-20 NOTE — Progress Notes (Signed)
PPM check 

## 2012-06-25 ENCOUNTER — Ambulatory Visit: Payer: Medicare HMO | Admitting: Internal Medicine

## 2012-06-28 ENCOUNTER — Other Ambulatory Visit: Payer: Self-pay | Admitting: Internal Medicine

## 2012-07-09 ENCOUNTER — Other Ambulatory Visit: Payer: Self-pay | Admitting: Internal Medicine

## 2012-07-09 DIAGNOSIS — Z1231 Encounter for screening mammogram for malignant neoplasm of breast: Secondary | ICD-10-CM

## 2012-07-17 ENCOUNTER — Ambulatory Visit: Payer: Medicare HMO | Admitting: Physical Therapy

## 2012-07-18 ENCOUNTER — Ambulatory Visit: Payer: Medicare HMO | Attending: Internal Medicine | Admitting: Audiology

## 2012-07-18 ENCOUNTER — Ambulatory Visit: Payer: Medicare HMO | Admitting: Physical Therapy

## 2012-07-18 DIAGNOSIS — I509 Heart failure, unspecified: Secondary | ICD-10-CM | POA: Insufficient documentation

## 2012-07-18 DIAGNOSIS — I495 Sick sinus syndrome: Secondary | ICD-10-CM | POA: Insufficient documentation

## 2012-07-18 DIAGNOSIS — E119 Type 2 diabetes mellitus without complications: Secondary | ICD-10-CM | POA: Insufficient documentation

## 2012-07-18 DIAGNOSIS — Z8673 Personal history of transient ischemic attack (TIA), and cerebral infarction without residual deficits: Secondary | ICD-10-CM | POA: Insufficient documentation

## 2012-07-18 DIAGNOSIS — I4891 Unspecified atrial fibrillation: Secondary | ICD-10-CM | POA: Insufficient documentation

## 2012-07-18 DIAGNOSIS — I10 Essential (primary) hypertension: Secondary | ICD-10-CM | POA: Insufficient documentation

## 2012-07-18 DIAGNOSIS — Z950 Presence of cardiac pacemaker: Secondary | ICD-10-CM | POA: Insufficient documentation

## 2012-07-26 ENCOUNTER — Encounter: Payer: Self-pay | Admitting: Internal Medicine

## 2012-07-26 ENCOUNTER — Ambulatory Visit (INDEPENDENT_AMBULATORY_CARE_PROVIDER_SITE_OTHER): Payer: Medicare HMO | Admitting: Internal Medicine

## 2012-07-26 VITALS — BP 170/88 | HR 106 | Ht 65.0 in | Wt 124.0 lb

## 2012-07-26 DIAGNOSIS — I4891 Unspecified atrial fibrillation: Secondary | ICD-10-CM

## 2012-07-26 DIAGNOSIS — Z950 Presence of cardiac pacemaker: Secondary | ICD-10-CM

## 2012-07-26 LAB — PACEMAKER DEVICE OBSERVATION
BRDY-0002RV: 60 {beats}/min
BRDY-0004RV: 120 {beats}/min
RV LEAD AMPLITUDE: 11.2 mv
RV LEAD THRESHOLD: 1 V

## 2012-07-26 NOTE — Patient Instructions (Addendum)
Your physician recommends that you schedule a follow-up appointment in: 4 weeks with device clinic

## 2012-07-26 NOTE — Assessment & Plan Note (Signed)
Her arrhythmias are stable. Her ventricular rate remains a bit higher than I would prefer that his for the most part well controlled. While she has a risk for stroke, she has been anemic and had a propensity for falls, and is no longer on systemic anticoagulation.

## 2012-07-26 NOTE — Progress Notes (Signed)
HPI [name] returns today for followup. She is a very pleasant 79 year old woman with symptomatic tachycardia bradycardia syndrome, chronic atrial fibrillation, history of stroke, status post permanent pacemaker insertion. In the interim, she has done well except for unexplained weight loss. In addition, she was hospitalized for a couple of days with anemia. No obvious cause was found and she was started on iron therapy. Her appetite has improved. She denies chest pain or shortness of breath. No peripheral edema. No syncope. No Known Allergies   Current Outpatient Prescriptions  Medication Sig Dispense Refill  . aspirin 81 MG tablet Take 1 tablet (81 mg total) by mouth daily.  30 tablet    . docusate sodium (COLACE) 100 MG capsule Take 1 capsule (100 mg total) by mouth daily as needed for constipation.  30 capsule  1  . ferrous sulfate 300 (60 FE) MG/5ML syrup Take 5 mLs (300 mg total) by mouth daily.  150 mL  3  . glucose blood test strip Use as instructed  100 each  12  . lisinopril (PRINIVIL,ZESTRIL) 40 MG tablet TAKE ONE TABLET BY MOUTH ONE TIME DAILY  30 tablet  0  . metoprolol (LOPRESSOR) 100 MG tablet Take 1 tablet (100 mg total) by mouth 2 (two) times daily.  60 tablet  5  . traMADol (ULTRAM) 50 MG tablet Take 1 tablet (50 mg total) by mouth every 6 (six) hours as needed for pain.  60 tablet  0  . VESICARE 10 MG tablet TAKE ONE TABLET BY MOUTH ONE TIME DAILY  30 tablet  0  . [DISCONTINUED] solifenacin (VESICARE) 10 MG tablet Take 1 tablet (10 mg total) by mouth daily.  30 tablet  5   No current facility-administered medications for this visit.     Past Medical History  Diagnosis Date  . Weight loss, unintentional 2010-2011    8/08-8/10 138-146 lbs, between 8/10-10/11 lost 18 lbs., work-up negative  . Incontinence overflow, stress female     on vesicare  . History of sick sinus syndrome s/p pacemaker Dr. [name] 2000  . Atrial fibrillation     on coumadin followed by Dr. Alexandria Lodge   . History of vaginal bleeding 05/2005    nagative endometrial biopsy  . Restrictive lung disease 1996    PFT's showed mild disease  . Anemia     Iron deficiency, does not tolerate po  . CHF (congestive heart failure)     diastolic dysfunction  . Diabetes mellitus     type 2  diet controlled  . Hypertension     well controlled on 3 agents  . Osteoporosis   . Stroke 2002    R MCA, cardioembolic  . Allergic rhinitis   . Onychomycosis   . History of trichomonal urethritis   . Arthritis   . DJD (degenerative joint disease)   . Constipation   . Hx of hysterectomy   . Pacemaker   . Diverticulosis of colon (without mention of hemorrhage)     ROS:   All systems reviewed and negative except as noted in the HPI.   Past Surgical History  Procedure Laterality Date  . Abdominal hysterectomy    . Cholecystectomy    . Pacemaker insertion  2000, Dr. [name]  . Cataract extraction       Family History  Problem Relation Age of Onset  . Diabetes Sister   . Heart disease       History   Social History  . Marital Status: Widowed  Spouse Name: N/A    Number of Children: 8  . Years of Education: N/A   Occupational History  . retired    Social History Main Topics  . Smoking status: Former Games developer  . Smokeless tobacco: Never Used  . Alcohol Use: 0.0 oz/week    0 drink(s) per week     Comment: occasional use  . Drug Use: No  . Sexually Active: Not on file   Other Topics Concern  . Not on file   Social History Narrative   [employer]   Nyra Capes home# [phone]   Cell# [phone]      Patient lives alone, but has 7, of 8, living children who check in on her.   She is widowed.     BP 170/88  Pulse 106  Ht 5\' 5"  (1.651 m)  Wt 124 lb (56.246 kg)  BMI 20.63 kg/m2  Physical Exam:  stable appearing elderly woman, NAD HEENT: Unremarkable Neck:  7 cm JVD, no thyromegally Back:  No CVA tenderness Lungs:  Clear with no wheezes, rales, or rhonchi. HEART:   IRegular rate rhythm, no murmurs, no rubs, no clicks Abd:  soft, positive bowel sounds, no organomegally, no rebound, no guarding Ext:  2 plus pulses, no edema, no cyanosis, no clubbing Skin:  No rashes no nodules Neuro:  CN II through XII intact, motor grossly intact  EKG - atrial fibrillation with a controlled ventricular rate.  DEVICE  Normal device function.  See PaceArt for details.   Assess/Plan:

## 2012-07-26 NOTE — Assessment & Plan Note (Signed)
Her [employer]tronic single-chamber pacemaker is working normally but is approaching elective replacement. We will schedule generator change when she reaches elective replacement.

## 2012-07-30 ENCOUNTER — Ambulatory Visit: Payer: Medicare HMO | Admitting: Physical Therapy

## 2012-08-01 ENCOUNTER — Ambulatory Visit: Payer: Medicare HMO | Admitting: Audiology

## 2012-08-06 ENCOUNTER — Ambulatory Visit: Payer: Medicare HMO | Admitting: Physical Therapy

## 2012-08-10 ENCOUNTER — Ambulatory Visit (INDEPENDENT_AMBULATORY_CARE_PROVIDER_SITE_OTHER): Payer: Medicare HMO | Admitting: Internal Medicine

## 2012-08-10 ENCOUNTER — Encounter: Payer: Self-pay | Admitting: Internal Medicine

## 2012-08-10 VITALS — BP 125/73 | HR 74 | Temp 97.8°F | Ht 65.0 in | Wt 125.3 lb

## 2012-08-10 DIAGNOSIS — I10 Essential (primary) hypertension: Secondary | ICD-10-CM

## 2012-08-10 DIAGNOSIS — D649 Anemia, unspecified: Secondary | ICD-10-CM

## 2012-08-10 DIAGNOSIS — R4181 Age-related cognitive decline: Secondary | ICD-10-CM | POA: Insufficient documentation

## 2012-08-10 DIAGNOSIS — E118 Type 2 diabetes mellitus with unspecified complications: Secondary | ICD-10-CM

## 2012-08-10 DIAGNOSIS — R413 Other amnesia: Secondary | ICD-10-CM

## 2012-08-10 DIAGNOSIS — H9193 Unspecified hearing loss, bilateral: Secondary | ICD-10-CM

## 2012-08-10 DIAGNOSIS — Z9181 History of falling: Secondary | ICD-10-CM

## 2012-08-10 DIAGNOSIS — R300 Dysuria: Secondary | ICD-10-CM

## 2012-08-10 DIAGNOSIS — D509 Iron deficiency anemia, unspecified: Secondary | ICD-10-CM

## 2012-08-10 DIAGNOSIS — H9190 Unspecified hearing loss, unspecified ear: Secondary | ICD-10-CM

## 2012-08-10 DIAGNOSIS — F0390 Unspecified dementia without behavioral disturbance: Secondary | ICD-10-CM

## 2012-08-10 DIAGNOSIS — E119 Type 2 diabetes mellitus without complications: Secondary | ICD-10-CM

## 2012-08-10 LAB — GLUCOSE, CAPILLARY: Glucose-Capillary: 95 mg/dL (ref 70–99)

## 2012-08-10 LAB — CBC
HCT: 34.3 % — ABNORMAL LOW (ref 36.0–46.0)
Hemoglobin: 11.4 g/dL — ABNORMAL LOW (ref 12.0–15.0)
WBC: 4.6 10*3/uL (ref 4.0–10.5)

## 2012-08-10 MED ORDER — LISINOPRIL 40 MG PO TABS: 40.0000 mg | ORAL_TABLET | Freq: Every day | ORAL | Status: DC

## 2012-08-10 MED ORDER — METOPROLOL TARTRATE 100 MG PO TABS: 100.0000 mg | ORAL_TABLET | Freq: Two times a day (BID) | ORAL | Status: DC

## 2012-08-10 MED ORDER — DONEPEZIL HCL 5 MG PO TABS: 5.0000 mg | ORAL_TABLET | Freq: Every day | ORAL | Status: DC

## 2012-08-10 NOTE — Assessment & Plan Note (Signed)
Pertinent Labs: Lab Results  Component Value Date   HGBA1C 6.1 08/10/2012   HGBA1C 6.8* 10/17/2010   CREATININE 0.94 05/16/2012   CREATININE 0.95 11/16/2011   MICROALBUR 1.36 05/26/2009   MICRALBCREAT 13.9 05/26/2009   CHOL 126 11/16/2011   HDL 50 11/16/2011   TRIG 76 11/16/2011    Assessment: Disease Control: good control (HgbA1C at goal)  Progress toward goals: at goal  Barriers to meeting goals: no barriers identified    Diet controlled.  Plan: Glucometer log was not reviewed today, as pt did not have glucometer available for review.   Continue lifestyle control of DM  reminded to bring blood glucose meter & log to each visit  Educational resources provided:    Self management tools provided:    Home glucose monitoring recommendation:  (as needed when feeling poorly)  (when needed)

## 2012-08-10 NOTE — Assessment & Plan Note (Signed)
Pertinent Data: BP Readings from Last 3 Encounters:  08/10/12 125/73  07/26/12 170/88  05/23/12 155/71    Basic Metabolic Panel:    Component Value Date/Time   NA 141 05/16/2012 0505   K 4.2 05/16/2012 0505   CL 113* 05/16/2012 0505   CO2 22 05/16/2012 0505   BUN 27* 05/16/2012 0505   CREATININE 0.94 05/16/2012 0505   CREATININE 0.95 11/16/2011 1227   GLUCOSE 113* 05/16/2012 0505   CALCIUM 8.5 05/16/2012 0505    Assessment: Disease Control:  Controlled  Progress toward goals: at goal  Barriers to meeting goals: no barriers identified    Patient is compliant most of the time with prescribed medications.   Plan:  continue current medications  Educational resources provided:    Self management tools provided:

## 2012-08-10 NOTE — Assessment & Plan Note (Signed)
Assessment: Patient indicates multiple mechanical falls at home, most significantly in January 2014 at which time she was hospitalized. Her daughter also indicates a fall 2 weeks ago, although they are not able to tell me circumstance of the fall. [name] (daughter) indicates the patient already had multiple safety devices at home including cane, power wheelchair PRN, shower rails and step in shower, clear paths throughout the house for ambulation.  They were offered and declined neurorehab services for continued care.  Plan:      Continue to reevaluate each visit.  Patient refused neurorehab services or PT for home safety eval at this time.

## 2012-08-10 NOTE — Patient Instructions (Addendum)
General Instructions:  Please follow-up at the clinic in 6 weeks, at which time we will reevaluate your memory issues, blood pressure, diabetes - OR, please follow-up in the clinic sooner if needed.  There have been changes in your medications:  START Aricept for your memory issues - see the information below about the medication.    If you have been started on new medication(s), and you develop symptoms concerning for allergic reaction, including, but not limited to, throat closing, tongue swelling, rash, please stop the medication immediately and call the clinic at [phone], and go to the ER.  If you are diabetic, please bring your meter to your next visit.  If symptoms worsen, or new symptoms arise, please call the clinic or go to the ER.  PLEASE BRING ALL OF YOUR MEDICATIONS  IN A BAG TO YOUR NEXT APPOINTMENT   Treatment Goals:  Goals (1 Years of Data) as of 08/10/12         As of Today 07/26/12 05/23/12 05/16/12 05/16/12     Blood Pressure    . Blood Pressure < 140/90  125/73 170/88 155/71 125/42 116/48     Diet    . Have 3 meals a day           Result Component    . HEMOGLOBIN A1C < 7.0  6.1        . LDL CALC < 100            Progress Toward Treatment Goals:  Treatment Goal 08/10/2012  Hemoglobin A1C at goal  Blood pressure at goal    Self Care Goals & Plans:  Self Care Goal 05/23/2012  Manage my medications take my medicines as prescribed; refill my medications on time  Monitor my health keep track of my blood glucose; check my feet daily  Eat healthy foods eat foods that are low in salt  Be physically active (No Data)    Home Blood Glucose Monitoring 08/10/2012  Check my blood sugar (No Data)  When to check my blood sugar -     Care Management & Community Referrals:  Referral 05/23/2012  Referrals made for care management support none needed          Donepezil tablets What is this medicine? DONEPEZIL (doe NEP e zil) is used to treat mild to moderate  dementia caused by Alzheimer's disease. This medicine may be used for other purposes; ask your health care provider or pharmacist if you have questions. What should I tell my health care provider before I take this medicine? They need to know if you have any of these conditions: -asthma or other lung disease -difficulty passing urine -head injury -heart disease, slow heartbeat -liver disease -Parkinson's disease -seizures (convulsions) -stomach or intestinal disease, ulcers or stomach bleeding -an unusual or allergic reaction to donepezil, other medicines, foods, dyes, or preservatives -pregnant or trying to get pregnant -breast-feeding How should I use this medicine? Take this medicine by mouth with a glass of water. Follow the directions on the prescription label. You may take this medicine with or without food. Take this medicine at regular intervals. This medicine is usually taken before bedtime. Do not take it more often than directed. Continue to take your medicine even if you feel better. Do not stop taking except on your doctor's advice. If you are taking the 23 mg donepezil tablet, swallow it whole; do not cut, crush, or chew it. Talk to your pediatrician regarding the use of this medicine in children.  Special care may be needed. Overdosage: If you think you have taken too much of this medicine contact a poison control center or emergency room at once. NOTE: This medicine is only for you. Do not share this medicine with others. What if I miss a dose? If you miss a dose, take it as soon as you can. If it is almost time for your next dose, take only that dose, do not take double or extra doses. What may interact with this medicine? -atropine -benztropine -bethanechol -carbamazepine -dexamethasone -dicyclomine -glycopyrrolate -hyoscyamine -ipratropium -itraconazole or ketoconazole -medicines for motion sickness -NSAIDs, medicines for pain and inflammation, like ibuprofen or  naproxen -other medicines for Alzheimer's disease -oxybutynin -phenobarbital -phenytoin -quinidine -rifampin, rifabutin or rifapentine -trihexyphenidyl This list may not describe all possible interactions. Give your health care provider a list of all the medicines, herbs, non-prescription drugs, or dietary supplements you use. Also tell them if you smoke, drink alcohol, or use illegal drugs. Some items may interact with your medicine. What should I watch for while using this medicine? Visit your doctor or health care professional for regular checks on your progress. Check with your doctor or health care professional if your symptoms do not get better or if they get worse. You may get drowsy or dizzy. Do not drive, use machinery, or do anything that needs mental alertness until you know how this drug affects you. What side effects may I notice from receiving this medicine? Side effects that you should report to your doctor or health care professional as soon as possible: -allergic reactions like skin rash, itching or hives, swelling of the face, lips, or tongue -changes in vision -feeling faint or lightheaded, falls -problems with balance -slow heartbeat, or palpitations -stomach pain -unusual bleeding or bruising, red or purple spots on the skin -vomiting -weight loss Side effects that usually do not require medical attention (report to your doctor or health care professional if they continue or are bothersome): -diarrhea, especially when starting treatment -headache -indigestion or heartburn -loss of appetite -muscle cramps -nausea This list may not describe all possible side effects. Call your doctor for medical advice about side effects. You may report side effects to FDA at 1-800-FDA-1088. Where should I keep my medicine? Keep out of reach of children. Store at room temperature between 15 and 30 degrees C (59 and 86 degrees F). Throw away any unused medicine after the expiration  date. NOTE: This sheet is a summary. It may not cover all possible information. If you have questions about this medicine, talk to your doctor, pharmacist, or health care provider.  2013, [employer]. (06/01/2011 12:56:08 PM)

## 2012-08-10 NOTE — Progress Notes (Signed)
Case discussed with Dr. Kalia-Reynolds at the time of the visit, immediately after the resident saw the patient.  I reviewed the resident's history and exam and pertinent patient test results.  I agree with the assessment, diagnosis and plan of care documented in the resident's note.   

## 2012-08-10 NOTE — Assessment & Plan Note (Signed)
Pertinent Data Reviewed: CBC:    Component Value Date/Time   WBC 6.2 05/23/2012 1101   HGB 8.8* 05/23/2012 1101   HCT 26.7* 05/23/2012 1101   PLT 226 05/23/2012 1101   MCV 92.7 05/23/2012 1101   NEUTROABS 3.4 05/23/2012 1101   LYMPHSABS 2.1 05/23/2012 1101   MONOABS 0.6 05/23/2012 1101   EOSABS 0.0 05/23/2012 1101   BASOSABS 0.0 05/23/2012 1101    Lab Results  Component Value Date   IRON 18* 05/14/2012   TIBC 262 05/14/2012   FERRITIN 14 05/14/2012    Assessment: Patient has history of iron-deficiency anemia, with prior baseline Hgb 8-9, acute decline requiring hospitalization and transfusion in 04/2012, and subsequent initiation of oral liquid iron supplementation, which the patient is tolerating. Cause was not definitively defined during the hospital course because the patient refused GI studies, including endoscopy/colonoscopy as an inpatient. Her drop in Hbg was thought likely related to to chronic slow GI bleed (diverticulosis seen on colonoscopy form 2011). She was stopped of her coumadin in 05/2012 with concerns for her falls, dementia, anemia, and continued risk of bleed.  Today, pt is tolerating her iron supplementation and denies significant fatigue, blood in stools, or blood in urine.  Plan:      Continue iron supplementation.  Will check CBC again today, adjust therapy as indicated.

## 2012-08-10 NOTE — Progress Notes (Signed)
Patient: [name]   MRN: [id number]  DOB: 05-31-1925  PCP: Holly Mann, DO   Subjective:    CC: Follow-up, Medication Refill and Diabetes   HPI: [name] is a 79 y.o. female with a PMHx as outlined below, who presented to clinic today accompanied by her daughter [name] for the following:  1) DM2, controlled -  Lab Results  Component Value Date   HGBA1C 6.1 08/10/2012   Patient checking blood sugars 1 times week randomly. Reports fasting blood sugars of 90-120s mg/dL. Currently taking no medications. Denies assisted hypoglycemia or recently hospitalizations for either hyper or hypoglycemia. denies polyuria, polydipsia, nausea, vomiting, diarrhea, abdominal pain.   2) HTN - Patient does not check blood pressure regularly at home. Currently taking Lisinopril 40mg  and Metoprolol 100mg  BID for BP. Patient misses doses 0 x per week on average. denies headaches, dizziness, lightheadedness, chest pain, shortness of breath.  does request refills today.  3) Memory concerns - daughter [name] indicates that there has been a decline in short term memory over the last 2-3 months. Is occasionally leaving the stove on, has had a fall in January and subsequent fall approximately 2 weeks ago. No recent illness including cough, congestions, sore throat, ear pain or discharge, eye pain or discharge. Confirms occasional dysuria, but unable to urinate for a sample at this time.    Review of Systems: Per HPI.   Current Outpatient Medications: Medication Sig  . aspirin 81 MG tablet Take 1 tablet (81 mg total) by mouth daily.  Marland Kitchen docusate sodium (COLACE) 100 MG capsule Take 1 capsule (100 mg total) by mouth daily as needed for constipation.  . ferrous sulfate 300 (60 FE) MG/5ML syrup Take 5 mLs (300 mg total) by mouth daily.  Marland Kitchen glucose blood test strip Use as instructed  . lisinopril (PRINIVIL,ZESTRIL) 40 MG tablet TAKE ONE TABLET BY MOUTH ONE TIME DAILY  . metoprolol  (LOPRESSOR) 100 MG tablet Take 1 tablet (100 mg total) by mouth 2 (two) times daily.  . traMADol (ULTRAM) 50 MG tablet Take 1 tablet (50 mg total) by mouth every 6 (six) hours as needed for pain.  . VESICARE 10 MG tablet TAKE ONE TABLET BY MOUTH ONE TIME DAILY    Allergies: No Known Allergies   Past Medical History  Diagnosis Date  . Weight loss, unintentional 2010-2011    8/08-8/10 138-146 lbs, between 8/10-10/11 lost 18 lbs., work-up negative  . Incontinence overflow, stress female     on vesicare  . History of sick sinus syndrome s/p pacemaker Dr. [name] 2000  . Atrial fibrillation     on coumadin followed by Dr. Alexandria Lodge  . History of vaginal bleeding 05/2005    nagative endometrial biopsy  . Restrictive lung disease 1996    PFT's showed mild disease  . Anemia     Iron deficiency, does not tolerate po  . CHF (congestive heart failure)     diastolic dysfunction  . Diabetes mellitus     type 2  diet controlled  . Hypertension     well controlled on 3 agents  . Osteoporosis   . Stroke 2002    R MCA, cardioembolic  . Allergic rhinitis   . Onychomycosis   . History of trichomonal urethritis   . Arthritis   . DJD (degenerative joint disease)   . Constipation   . Hx of hysterectomy   . Pacemaker   . Diverticulosis of colon (without mention of hemorrhage)  Objective:    Physical Exam: Filed Vitals:   08/10/12 1027  BP: 125/73  Pulse: 74  Temp: 97.8 F (36.6 C)     General: Vital signs reviewed and noted. Well-developed, well-nourished, in no acute distress; alert, appropriate and cooperative throughout examination.  Head: Normocephalic, atraumatic. No TTP over lateral neck today.  Lungs:  Normal respiratory effort. Clear to auscultation BL without crackles or wheezes.  Heart: Irregular rate, rhythm S1 and S2 normal without gallop, rubs. (+) murmur.  Abdomen:  BS normoactive. Soft, Nondistended, non-tender.  No masses or organomegaly.  Extremities: No pretibial  edema.     MMSE  Commands Normal Score  What is the date: (year)(season)(date)(day)(month) 5 1  Where are we: (state)(county)(town)(hospital)(floor) 5 4  Name three objects, ask pt to repeat 3 3  Serial 7s or WORLD backwards 5 0  Ask for the three objects repeated above: one point for each correct. 3 1  Show and ask patient to name a pencil and wrist watch 2 2  Follow a three stage command 3 3  Repeat the following, "No ifs, ands, or buts." Allow only one trial 1 0  On a blank piece of paper write "close your eyes;" ask the patient to read and do what it says 1 1  Give the patient a blank piece of paper and ask him/her to write a sentence. The sentence must contain a noun and verb and be sensible 1 1  Ask the patient to copy a design (eg, intersecting pentagons). All 10 angles must be present and two must intersect 1 0  TOTAL 30 15   Assessment/ Plan:   The patient's case and plan of care was discussed with attending physician, Dr. Lars Mage.

## 2012-08-10 NOTE — Assessment & Plan Note (Signed)
Pertinent Data Reviewed: MMSE: 15/30  Assessment: MMSE consistent with significant cognitive impairment. I do think this is true progressive dementia since our > 1 year of interactions. The family refuses additional home health services right now. But, they are wanting to at least try a medication to help. I informed them that we can try Aricept, however, typically effective for mild-moderate dementia, and can have anticholinergic side effects. They would still like to proceed.  Plan:      Start Aricept 5mg  daily, consider to escalate further next visit in 6 weeks if helpful.  Handout given regarding medication side effects, etc.

## 2012-08-13 ENCOUNTER — Ambulatory Visit (HOSPITAL_COMMUNITY)
Admission: RE | Admit: 2012-08-13 | Discharge: 2012-08-13 | Disposition: A | Discharge status: Home / Self Care | Payer: Medicare HMO | Source: Ambulatory Visit | Attending: Internal Medicine | Admitting: Internal Medicine

## 2012-08-13 ENCOUNTER — Encounter: Payer: Medicare HMO | Admitting: Physical Therapy

## 2012-08-13 DIAGNOSIS — Z1231 Encounter for screening mammogram for malignant neoplasm of breast: Secondary | ICD-10-CM | POA: Insufficient documentation

## 2012-08-13 NOTE — Progress Notes (Signed)
Quick Note:  Hgb is stabilizing after starting iron supplementation. Will continue current dosage. Called pt's daughter [name] (pt ok with this) and informed of results and plan of care. She verbally expressed understanding. ______

## 2012-08-14 ENCOUNTER — Ambulatory Visit: Payer: Medicare HMO | Admitting: Physical Therapy

## 2012-08-16 ENCOUNTER — Other Ambulatory Visit: Payer: Self-pay | Admitting: Internal Medicine

## 2012-08-22 ENCOUNTER — Ambulatory Visit: Payer: Medicare HMO | Attending: Internal Medicine | Admitting: Physical Therapy

## 2012-08-22 DIAGNOSIS — I509 Heart failure, unspecified: Secondary | ICD-10-CM | POA: Insufficient documentation

## 2012-08-22 DIAGNOSIS — E119 Type 2 diabetes mellitus without complications: Secondary | ICD-10-CM | POA: Insufficient documentation

## 2012-08-22 DIAGNOSIS — Z950 Presence of cardiac pacemaker: Secondary | ICD-10-CM | POA: Insufficient documentation

## 2012-08-22 DIAGNOSIS — I4891 Unspecified atrial fibrillation: Secondary | ICD-10-CM | POA: Insufficient documentation

## 2012-08-22 DIAGNOSIS — Z8673 Personal history of transient ischemic attack (TIA), and cerebral infarction without residual deficits: Secondary | ICD-10-CM | POA: Insufficient documentation

## 2012-08-22 DIAGNOSIS — I10 Essential (primary) hypertension: Secondary | ICD-10-CM | POA: Insufficient documentation

## 2012-08-22 DIAGNOSIS — I495 Sick sinus syndrome: Secondary | ICD-10-CM | POA: Insufficient documentation

## 2012-08-23 ENCOUNTER — Encounter: Payer: Medicare HMO | Admitting: Cardiology

## 2012-08-23 DIAGNOSIS — H9190 Unspecified hearing loss, unspecified ear: Secondary | ICD-10-CM | POA: Insufficient documentation

## 2012-08-23 NOTE — Addendum Note (Signed)
Addended by: Priscella Mann on: 08/23/2012 01:02 PM   Modules accepted: Orders

## 2012-08-30 ENCOUNTER — Telehealth: Payer: Self-pay | Admitting: *Deleted

## 2012-08-30 NOTE — Telephone Encounter (Signed)
Returned call to daughter of pt. Who was asking for an appointment.  No answer - message left  10:51

## 2012-08-30 NOTE — Telephone Encounter (Signed)
Pt's daughter returned call and is concerned with swelling to pt's feet, and ankles. Swelling is noted during day, but resolves at night.  Onset 3 days ago.  Pt has had same swelling in past but it would not last this long. Denies any SOB, rt leg is a little more swollen than left.  Legs are not red or hot to touch.  We don't have any appointments today.  Will see pt tomorrow in clinic.

## 2012-08-31 ENCOUNTER — Encounter: Payer: Self-pay | Admitting: Internal Medicine

## 2012-08-31 ENCOUNTER — Ambulatory Visit (INDEPENDENT_AMBULATORY_CARE_PROVIDER_SITE_OTHER): Payer: Medicare HMO | Admitting: Internal Medicine

## 2012-08-31 VITALS — BP 175/78 | HR 77 | Temp 97.8°F | Ht 65.0 in | Wt 130.8 lb

## 2012-08-31 DIAGNOSIS — R600 Localized edema: Secondary | ICD-10-CM | POA: Insufficient documentation

## 2012-08-31 DIAGNOSIS — R609 Edema, unspecified: Secondary | ICD-10-CM

## 2012-08-31 DIAGNOSIS — I10 Essential (primary) hypertension: Secondary | ICD-10-CM

## 2012-08-31 LAB — BASIC METABOLIC PANEL WITH GFR
BUN: 12 mg/dL (ref 6–23)
CO2: 26 mEq/L (ref 19–32)
Chloride: 102 mEq/L (ref 96–112)
Creat: 0.96 mg/dL (ref 0.50–1.10)
Glucose, Bld: 90 mg/dL (ref 70–99)

## 2012-08-31 MED ORDER — FUROSEMIDE 20 MG PO TABS: 20.0000 mg | ORAL_TABLET | Freq: Two times a day (BID) | ORAL | Status: DC

## 2012-08-31 NOTE — Assessment & Plan Note (Addendum)
Likely multifactorial in the setting of preserved ejection fraction heart failure along with chronic venous stasis . I will obtain a basic metabolic panel to evaluate renal function. If within normal limits I will start  patient on Lasix.I instructed the patient to go to the emergency room if she since been using shortness of breath, chest pain or significant worsening of leg swelling. At this point I recommended to elevated her legs as much as possible.  Update: Renal function normal Will start Lasix 20 mg daily . Please repeat Bmet during next office visit.

## 2012-08-31 NOTE — Patient Instructions (Addendum)
If you notice any Shortness of Breath, worsening swelling in your leg, redness please go to the emergency room as soon as possible   Elevated to leg as much as possible.

## 2012-08-31 NOTE — Addendum Note (Signed)
Addended by: Almyra Deforest on: 08/31/2012 06:10 PM   Modules accepted: Orders

## 2012-08-31 NOTE — Progress Notes (Signed)
Subjective:   Patient ID: [name] female   DOB: [birth year] 79 y.o.   MRN: [id number]  HPI: [name] is a 79 y.o. female with past history significant as outlined below who presented to the clinic with a leg swelling. Patient reports it started on Sunday and has gotten worse. Whenever she elevated elevate her legs it improves. Denies any redness or warmth feeling in her legs but it is significantly uncomfortable. Patient denies any trauma, changes in diet, changes in medication. Patient denies any chest pain or short of breath, dizziness.  The daughter informed me to call her for results: [phone] Doristine Mango   Past Medical History  Diagnosis Date  . Weight loss, unintentional 2010-2011    8/08-8/10 138-146 lbs, between 8/10-10/11 lost 18 lbs., work-up negative  . Incontinence overflow, stress female     on vesicare  . History of sick sinus syndrome s/p pacemaker Dr. [name] 2000  . Atrial fibrillation     on coumadin followed by Dr. Alexandria Lodge  . History of vaginal bleeding 05/2005    nagative endometrial biopsy  . Restrictive lung disease 1996    PFT's showed mild disease  . Anemia     Iron deficiency, does not tolerate po  . CHF (congestive heart failure)     diastolic dysfunction  . Diabetes mellitus     type 2  diet controlled  . Hypertension     well controlled on 3 agents  . Osteoporosis   . Stroke 2002    R MCA, cardioembolic  . Allergic rhinitis   . Onychomycosis   . History of trichomonal urethritis   . Arthritis   . DJD (degenerative joint disease)   . Constipation   . Hx of hysterectomy   . Pacemaker   . Diverticulosis of colon (without mention of hemorrhage)    Current Outpatient Prescriptions  Medication Sig Dispense Refill  . aspirin 81 MG tablet Take 1 tablet (81 mg total) by mouth daily.  30 tablet    . docusate sodium (COLACE) 100 MG capsule Take 1 capsule (100 mg total) by mouth daily as needed for constipation.  30 capsule  1  .  donepezil (ARICEPT) 5 MG tablet Take 1 tablet (5 mg total) by mouth at bedtime.  30 tablet  2  . ferrous sulfate 300 (60 FE) MG/5ML syrup Take 5 mLs (300 mg total) by mouth daily.  150 mL  3  . glucose blood test strip Use as instructed  100 each  12  . lisinopril (PRINIVIL,ZESTRIL) 40 MG tablet Take 1 tablet (40 mg total) by mouth daily.  30 tablet  5  . metoprolol (LOPRESSOR) 100 MG tablet Take 1 tablet (100 mg total) by mouth 2 (two) times daily.  60 tablet  5  . traMADol (ULTRAM) 50 MG tablet Take 1 tablet (50 mg total) by mouth every 6 (six) hours as needed for pain.  60 tablet  0  . VESICARE 10 MG tablet TAKE ONE TABLET BY MOUTH ONE TIME DAILY  30 tablet  0  . [DISCONTINUED] solifenacin (VESICARE) 10 MG tablet Take 1 tablet (10 mg total) by mouth daily.  30 tablet  5   No current facility-administered medications for this visit.   Family History  Problem Relation Age of Onset  . Diabetes Sister   . Heart disease     History   Social History  . Marital Status: Widowed    Spouse Name: N/A    Number of Children:  8  . Years of Education: N/A   Occupational History  . retired    Social History Main Topics  . Smoking status: Former Games developer  . Smokeless tobacco: Never Used  . Alcohol Use: 0.0 oz/week    0 drink(s) per week     Comment: occasional use  . Drug Use: No  . Sexually Active: None   Other Topics Concern  . None   Social History Narrative   [employer]   Nyra Capes home# [phone]   Cell# [phone]      Patient lives alone, but has 7, of 8, living children who check in on her.   She is widowed.   Review of Systems: Constitutional: Denies fever, chills, diaphoresis, appetite change and fatigue.   Respiratory: Denies SOB, DOE, cough, chest tightness,  and wheezing.   Cardiovascular: Denies chest pain, palpitations and leg swelling.  Gastrointestinal: Denies nausea, vomiting, abdominal pain, diarrhea, constipation, blood in stool and abdominal distention.   Genitourinary: Denies dysuria, urgency, frequency, hematuria, flank pain and difficulty urinating.  Neurological: Denies dizziness,  Objective:  Physical Exam: Filed Vitals:   08/31/12 1505  BP: 173/82  Pulse: 81  Temp: 97.8 F (36.6 C)  TempSrc: Oral  Height: 5\' 5"  (1.651 m)  Weight: 130 lb 12.8 oz (59.33 kg)  SpO2: 97%   Constitutional: Vital signs reviewed.  Patient is a well-developed and well-nourished female in no acute distress and cooperative with exam. Alert and oriented x3.   Neck: Supple,  Cardiovascular: Irregular rate and rhythm, S1 normal, S2 normal, no MRG Pulmonary/Chest: CTAB, no wheezes, rales, or rhonchi Abdominal: Soft. Non-tender, non-distended, bowel sounds are normal, no masses, organomegaly, or guarding present.  GU: no CVA tenderness Extremities: 2+ pitting edema from knee down to dorsalis pedis. Likely tender to palpation. No erythema, no masses noted. Not warm to touch. 1+ to dorsalis pedis palpable on the left but difficult to assess [place] on the right Neurological: A&O x3

## 2012-09-01 NOTE — Progress Notes (Signed)
Case discussed with Dr. Illath immediately after the resident saw the patient. We reviewed the resident's history and exam and pertinent patient test results. I agree with the assessment, diagnosis and plan of care documented in the resident's note. 

## 2012-09-13 ENCOUNTER — Encounter: Payer: Medicare HMO | Admitting: Cardiology

## 2012-09-14 ENCOUNTER — Encounter: Payer: Self-pay | Admitting: Internal Medicine

## 2012-09-20 ENCOUNTER — Encounter: Payer: Medicare HMO | Admitting: Internal Medicine

## 2012-10-01 ENCOUNTER — Other Ambulatory Visit: Payer: Self-pay | Admitting: Internal Medicine

## 2012-10-02 NOTE — Telephone Encounter (Signed)
Pls sch with any MD (high no show) next 30 days - BP F/U

## 2012-10-04 ENCOUNTER — Encounter: Payer: Medicare HMO | Admitting: Cardiology

## 2012-10-12 ENCOUNTER — Other Ambulatory Visit: Payer: Self-pay | Admitting: Internal Medicine

## 2012-10-12 NOTE — Telephone Encounter (Signed)
Pls sch with any MD (high no show) ASAP - pt started on lasix and no F/U since then

## 2012-10-17 NOTE — Telephone Encounter (Signed)
Unable to reach pt, message sent to front desk to schedule f/u appointment

## 2012-10-23 ENCOUNTER — Telehealth: Payer: Self-pay

## 2012-10-23 ENCOUNTER — Encounter: Payer: Medicare HMO | Admitting: Cardiology

## 2012-10-23 ENCOUNTER — Ambulatory Visit (INDEPENDENT_AMBULATORY_CARE_PROVIDER_SITE_OTHER): Payer: Medicare HMO | Admitting: Cardiology

## 2012-10-23 ENCOUNTER — Encounter: Payer: Self-pay | Admitting: *Deleted

## 2012-10-23 ENCOUNTER — Encounter: Payer: Self-pay | Admitting: Cardiology

## 2012-10-23 VITALS — BP 130/78 | HR 88 | Ht 65.0 in

## 2012-10-23 DIAGNOSIS — I495 Sick sinus syndrome: Secondary | ICD-10-CM

## 2012-10-23 DIAGNOSIS — Z45010 Encounter for checking and testing of cardiac pacemaker pulse generator [battery]: Secondary | ICD-10-CM

## 2012-10-23 DIAGNOSIS — Z950 Presence of cardiac pacemaker: Secondary | ICD-10-CM

## 2012-10-23 DIAGNOSIS — I4891 Unspecified atrial fibrillation: Secondary | ICD-10-CM

## 2012-10-23 DIAGNOSIS — Z45018 Encounter for adjustment and management of other part of cardiac pacemaker: Secondary | ICD-10-CM

## 2012-10-23 LAB — PACEMAKER DEVICE OBSERVATION
BRDY-0004RV: 120 {beats}/min
RV LEAD AMPLITUDE: 11.2 mv

## 2012-10-23 NOTE — Telephone Encounter (Signed)
Pt is scheduled to have Gen. Change out on 7/18 with Dr [name]. Due to scheduling conflict pts procedure time has been changed to 12:30 pm with pt arriving at 10:30 am. Pts daughter, [name], verbalized understanding and agrees with plan.

## 2012-10-23 NOTE — Patient Instructions (Addendum)
Your physician has recommended that you have a pacemaker Generator Change. A pacemaker is a small device that is placed under the skin of your chest or abdomen to help control abnormal heart rhythms. This device uses electrical pulses to prompt the heart to beat at a normal rate. Pacemakers are used to treat heart rhythms that are too slow. Wire (leads) are attached to the pacemaker that goes into the chambers of you heart. This is done in the hospital and usually requires and overnight stay.  Your physician recommends that you return for lab work in: July 14 FOR CBC, BMET  Your physician has recommended you make the following change in your medication:   HOLD LASIX MORNING OF PROCEDURE July 18TH

## 2012-10-23 NOTE — Progress Notes (Signed)
ELECTROPHYSIOLOGY OFFICE NOTE  Patient ID: [name] MRN: [id number], DOB/AGE: 05/23/1925   Date of Visit: 10/23/2012  Primary Physician: Blanch Media, MD Primary EP: Lewayne Bunting, MD Reason for Visit: EP/device follow-up  History of Present Illness  [name] is a 79 y.o. female with chronic AF and tachy-brady syndrome s/p PPM implant who presents today for routine electrophysiology followup. She is accompanied by her daughter who assists with history questions. Her PPM battery reached ERI on 08/14/2012.  Since last being seen in our clinic, she reports she is doing well and has no complaints. She denies chest pain or shortness of breath. She denies palpitations, dizziness, near syncope or syncope. She denies LE swelling, orthopnea, PND or recent weight gain. She is compliant and tolerating medications without difficulty.  Past Medical History Past Medical History  Diagnosis Date  . Weight loss, unintentional 2010-2011    8/08-8/10 138-146 lbs, between 8/10-10/11 lost 18 lbs., work-up negative  . Incontinence overflow, stress female     on vesicare  . History of sick sinus syndrome s/p pacemaker Dr. [name] 2000  . Atrial fibrillation     on coumadin followed by Dr. Alexandria Lodge  . History of vaginal bleeding 05/2005    nagative endometrial biopsy  . Restrictive lung disease 1996    PFT's showed mild disease  . Anemia     Iron deficiency, does not tolerate po  . CHF (congestive heart failure)     diastolic dysfunction  . Diabetes mellitus     type 2  diet controlled  . Hypertension     well controlled on 3 agents  . Osteoporosis   . Stroke 2002    R MCA, cardioembolic  . Allergic rhinitis   . Onychomycosis   . History of trichomonal urethritis   . Arthritis   . DJD (degenerative joint disease)   . Constipation   . Hx of hysterectomy   . Pacemaker   . Diverticulosis of colon (without mention of hemorrhage)     Past Surgical History Past Surgical History    Procedure Laterality Date  . Abdominal hysterectomy    . Cholecystectomy    . Pacemaker insertion  2000, Dr. [name]  . Cataract extraction      Allergies/Intolerances No Known Allergies  Current Home Medications Current Outpatient Prescriptions  Medication Sig Dispense Refill  . aspirin 81 MG tablet Take 1 tablet (81 mg total) by mouth daily.  30 tablet    . docusate sodium (COLACE) 100 MG capsule Take 1 capsule (100 mg total) by mouth daily as needed for constipation.  30 capsule  1  . donepezil (ARICEPT) 5 MG tablet Take 1 tablet (5 mg total) by mouth at bedtime.  30 tablet  2  . ferrous sulfate 300 (60 FE) MG/5ML syrup Take 5 mLs (300 mg total) by mouth daily.  150 mL  3  . furosemide (LASIX) 20 MG tablet TAKE ONE TABLET BY MOUTH TWICE DAILY  30 tablet  0  . glucose blood test strip Use as instructed  100 each  12  . lisinopril (PRINIVIL,ZESTRIL) 40 MG tablet Take 1 tablet (40 mg total) by mouth daily.  30 tablet  5  . metoprolol (LOPRESSOR) 100 MG tablet Take 1 tablet (100 mg total) by mouth 2 (two) times daily.  60 tablet  5  . traMADol (ULTRAM) 50 MG tablet Take 1 tablet (50 mg total) by mouth every 6 (six) hours as needed for pain.  60 tablet  0  .  VESICARE 10 MG tablet TAKE ONE TABLET BY MOUTH ONE TIME DAILY  30 tablet  5  . [DISCONTINUED] solifenacin (VESICARE) 10 MG tablet Take 1 tablet (10 mg total) by mouth daily.  30 tablet  5   No current facility-administered medications for this visit.   Social History History   Social History  . Marital Status: Widowed    Spouse Name: N/A    Number of Children: 8  . Years of Education: N/A   Occupational History  . retired    Social History Main Topics  . Smoking status: Former Games developer  . Smokeless tobacco: Never Used  . Alcohol Use: 0.0 oz/week    0 drink(s) per week     Comment: occasional use  . Drug Use: No  . Sexually Active: Not on file   Other Topics Concern  . Not on file   Social History Narrative    [employer]   Nyra Capes home# [phone]   Cell# [phone]      Patient lives alone, but has 7, of 8, living children who check in on her.   She is widowed.    Review of Systems General: No chills, fever, night sweats or weight changes Cardiovascular: No chest pain, dyspnea on exertion, edema, orthopnea, palpitations, paroxysmal nocturnal dyspnea Dermatological: No rash, lesions or masses Respiratory: No cough, dyspnea Urologic: No hematuria, dysuria Abdominal: No nausea, vomiting, diarrhea, bright red blood per rectum, melena, or hematemesis Neurologic: No visual changes, weakness, changes in mental status All other systems reviewed and are otherwise negative except as noted above.  Physical Exam Vitals: Blood pressure 130/78, pulse 88, height 5\' 5"  (1.651 m), SpO2 97.00%.  General: Well developed, well appearing 79 y.o. female in no acute distress. HEENT: Normocephalic, atraumatic. EOMs intact. Sclera nonicteric. Oropharynx clear.  Neck: Supple without bruits. No JVD. Lungs: Respirations regular and unlabored, CTA bilaterally. No wheezes, rales or rhonchi. Heart: RRR. S1, S2 present. Soft II/VI systolic murmur at apex. No rub, S3 or S4. Abdomen: Soft, non-distended.  Extremities: No clubbing, cyanosis or edema. DP/PT/Radials 2+ and equal bilaterally. Psych: Normal affect. Neuro: Alert and oriented X 3. Moves all extremities spontaneously.   Diagnostics Device interrogation today - collection limited as PPM battery reached ERI on 08/14/2012; lead impedance stable; sensing appropriate; V paced 12.5% of time  Most recent echo Jan 2014 Study Conclusions - Left ventricle: Systolic function was normal. The estimated ejection fraction was in the range of 50% to 55%. - Aortic valve: Mild regurgitation. - Mitral valve: Mild regurgitation. - Left atrium: The atrium was moderately dilated. - Right atrium: The atrium was mildly dilated. - Tricuspid valve: Moderate regurgitation. -  Pulmonary arteries: Systolic pressure was moderately increased. PA peak pressure: 57mm Hg (S).   Assessment and Plan 1. PPM battery at [facility] 2. Tachy-brady syndrome s/p PPM implant 3. Chronic AF  [name]' PPM battery is at [facility]. I discussed indication for PPM generator change. I reviewed the procedure including risks and benefits with her and her daughter in detail. These risks include but are not limited to bleeding, infection and lead dislodgement. She and her daughter expressed verbal understanding and agree to proceed. This has been scheduled with Dr. [name] on 11/02/2012.  Signed, Rick Duff, PA-C 10/23/2012, 12:00 PM

## 2012-10-24 ENCOUNTER — Encounter (HOSPITAL_COMMUNITY): Payer: Self-pay | Admitting: Respiratory Therapy

## 2012-10-25 ENCOUNTER — Other Ambulatory Visit: Payer: Self-pay

## 2012-10-29 ENCOUNTER — Other Ambulatory Visit (INDEPENDENT_AMBULATORY_CARE_PROVIDER_SITE_OTHER): Payer: Medicare HMO

## 2012-10-29 ENCOUNTER — Encounter: Payer: Self-pay | Admitting: Internal Medicine

## 2012-10-29 ENCOUNTER — Ambulatory Visit (INDEPENDENT_AMBULATORY_CARE_PROVIDER_SITE_OTHER): Payer: Medicare HMO | Admitting: Internal Medicine

## 2012-10-29 VITALS — BP 151/88 | HR 109 | Temp 97.9°F | Wt 127.9 lb

## 2012-10-29 DIAGNOSIS — Z45010 Encounter for checking and testing of cardiac pacemaker pulse generator [battery]: Secondary | ICD-10-CM

## 2012-10-29 DIAGNOSIS — I4891 Unspecified atrial fibrillation: Secondary | ICD-10-CM

## 2012-10-29 DIAGNOSIS — I10 Essential (primary) hypertension: Secondary | ICD-10-CM

## 2012-10-29 DIAGNOSIS — R609 Edema, unspecified: Secondary | ICD-10-CM

## 2012-10-29 DIAGNOSIS — I495 Sick sinus syndrome: Secondary | ICD-10-CM

## 2012-10-29 DIAGNOSIS — R600 Localized edema: Secondary | ICD-10-CM

## 2012-10-29 DIAGNOSIS — E118 Type 2 diabetes mellitus with unspecified complications: Secondary | ICD-10-CM

## 2012-10-29 DIAGNOSIS — Z950 Presence of cardiac pacemaker: Secondary | ICD-10-CM

## 2012-10-29 DIAGNOSIS — Z45018 Encounter for adjustment and management of other part of cardiac pacemaker: Secondary | ICD-10-CM

## 2012-10-29 DIAGNOSIS — K5900 Constipation, unspecified: Secondary | ICD-10-CM

## 2012-10-29 LAB — BASIC METABOLIC PANEL
Calcium: 9.4 mg/dL (ref 8.4–10.5)
Creatinine, Ser: 1.1 mg/dL (ref 0.4–1.2)
GFR: 58.87 mL/min — ABNORMAL LOW (ref >60.00)
Glucose, Bld: 89 mg/dL (ref 70–99)
Sodium: 140 mEq/L (ref 135–145)

## 2012-10-29 LAB — CBC WITH DIFFERENTIAL/PLATELET
Eosinophils Relative: 1.1 % (ref 0.0–5.0)
HCT: 32.5 % — ABNORMAL LOW (ref 36.0–46.0)
Lymphs Abs: 1.6 10*3/uL (ref 0.7–4.0)
MCV: 100.9 fl — ABNORMAL HIGH (ref 78.0–100.0)
Monocytes Absolute: 0.6 10*3/uL (ref 0.1–1.0)
Platelets: 125 10*3/uL — ABNORMAL LOW (ref 150.0–400.0)
RDW: 15.4 % — ABNORMAL HIGH (ref 11.5–14.6)
WBC: 4.4 10*3/uL — ABNORMAL LOW (ref 4.5–10.5)

## 2012-10-29 MED ORDER — LISINOPRIL 40 MG PO TABS: 40.0000 mg | ORAL_TABLET | Freq: Every day | ORAL | Status: DC

## 2012-10-29 MED ORDER — FUROSEMIDE 20 MG PO TABS: 20.0000 mg | ORAL_TABLET | Freq: Two times a day (BID) | ORAL | Status: DC

## 2012-10-29 MED ORDER — DOCUSATE SODIUM 100 MG PO CAPS: 100.0000 mg | ORAL_CAPSULE | Freq: Every day | ORAL | Status: AC | PRN

## 2012-10-29 MED ORDER — METOPROLOL TARTRATE 100 MG PO TABS: 100.0000 mg | ORAL_TABLET | Freq: Two times a day (BID) | ORAL | Status: DC

## 2012-10-29 MED ORDER — DONEPEZIL HCL 5 MG PO TABS: 5.0000 mg | ORAL_TABLET | Freq: Every day | ORAL | Status: DC

## 2012-10-29 NOTE — Patient Instructions (Addendum)
Follow up with PCP in 3 months.  

## 2012-10-29 NOTE — Progress Notes (Signed)
Subjective:   Patient ID: [name] female   DOB: Oct 08, 1925 79 y.o.   MRN: [id number]  HPI: [name] is a 79 y.o. female who comes to clinic today for a general 3 month f/u. The patient has no acute complaints. She reports that she is doing quite well. The patient is anxious about her upcoming pacemaker battery exchange. See A+P for problem based discussion of chronic medical problems.    Past Medical History  Diagnosis Date  . Weight loss, unintentional 2010-2011    8/08-8/10 138-146 lbs, between 8/10-10/11 lost 18 lbs., work-up negative  . Incontinence overflow, stress female     on vesicare  . History of sick sinus syndrome s/p pacemaker Dr. [name] 2000  . Atrial fibrillation     on coumadin followed by Dr. Alexandria Lodge  . History of vaginal bleeding 05/2005    nagative endometrial biopsy  . Restrictive lung disease 1996    PFT's showed mild disease  . Anemia     Iron deficiency, does not tolerate po  . CHF (congestive heart failure)     diastolic dysfunction  . Diabetes mellitus     type 2  diet controlled  . Hypertension     well controlled on 3 agents  . Osteoporosis   . Stroke 2002    R MCA, cardioembolic  . Allergic rhinitis   . Onychomycosis   . History of trichomonal urethritis   . Arthritis   . DJD (degenerative joint disease)   . Constipation   . Hx of hysterectomy   . Pacemaker   . Diverticulosis of colon (without mention of hemorrhage)    Current Outpatient Prescriptions  Medication Sig Dispense Refill  . aspirin 81 MG tablet Take 1 tablet (81 mg total) by mouth daily.  30 tablet    . docusate sodium (COLACE) 100 MG capsule Take 1 capsule (100 mg total) by mouth daily as needed for constipation.  30 capsule  1  . donepezil (ARICEPT) 5 MG tablet Take 5 mg by mouth at bedtime.      . ferrous sulfate 325 (65 FE) MG tablet Take 325 mg by mouth daily with breakfast.      . furosemide (LASIX) 20 MG tablet Take 20 mg by mouth 2 (two) times daily.       Marland Kitchen lisinopril (PRINIVIL,ZESTRIL) 40 MG tablet Take 1 tablet (40 mg total) by mouth daily.  30 tablet  5  . metoprolol (LOPRESSOR) 100 MG tablet Take 1 tablet (100 mg total) by mouth 2 (two) times daily.  60 tablet  5  . solifenacin (VESICARE) 10 MG tablet Take 10 mg by mouth daily.      Marland Kitchen glucose blood test strip Use as instructed  100 each  12  . [DISCONTINUED] solifenacin (VESICARE) 10 MG tablet Take 1 tablet (10 mg total) by mouth daily.  30 tablet  5   No current facility-administered medications for this visit.   Family History  Problem Relation Age of Onset  . Diabetes Sister   . Heart disease     History   Social History  . Marital Status: Widowed    Spouse Name: N/A    Number of Children: 8  . Years of Education: N/A   Occupational History  . retired    Social History Main Topics  . Smoking status: Former Games developer  . Smokeless tobacco: Never Used  . Alcohol Use: 0.0 oz/week    0 drink(s) per week  Comment: occasional use  . Drug Use: No  . Sexually Active: None   Other Topics Concern  . None   Social History Narrative   [employer]   Nyra Capes home# [phone]   Cell# [phone]      Patient lives alone, but has 7, of 8, living children who check in on her.   She is widowed.   Review of Systems:  Review of Systems  Constitutional: Negative for fever, chills and malaise/fatigue.  HENT: Negative for nosebleeds and sore throat.   Eyes: Positive for blurred vision.  Respiratory: Positive for cough, shortness of breath and wheezing.   Cardiovascular: Positive for leg swelling. Negative for chest pain and palpitations.  Gastrointestinal: Positive for diarrhea and constipation. Negative for heartburn, nausea, vomiting, blood in stool and melena.  Genitourinary: Negative for dysuria, urgency and frequency.  Skin: Negative for itching and rash.  Neurological: Negative for weakness and headaches.   Objective:  Physical Exam: Filed Vitals:   10/29/12 0910  10/29/12 0941  BP: 148/76 151/88  Pulse: 72 109  Temp: 97.9 F (36.6 C)   TempSrc: Oral   Weight: 127 lb 14.4 oz (58.015 kg)   SpO2: 98%    Physical Exam  Constitutional: She is oriented to person, place, and time. She appears well-developed and well-nourished. No distress.  HENT:  Head: Normocephalic and atraumatic.  Eyes: EOM are normal. Pupils are equal, round, and reactive to light.  Bilateral arcus senilis.  Cardiovascular: Normal rate, regular rhythm and normal heart sounds.  Exam reveals no gallop and no friction rub.   No murmur heard. Pulmonary/Chest: Effort normal and breath sounds normal. No respiratory distress. She has no wheezes. She has no rales. She exhibits no tenderness.  Abdominal: Soft. Bowel sounds are normal. She exhibits no distension. There is no tenderness. There is no rebound and no guarding.  Neurological: She is alert and oriented to person, place, and time.  Skin: Skin is warm. No rash noted. She is not diaphoretic. No erythema.    Assessment & Plan:

## 2012-10-29 NOTE — Assessment & Plan Note (Signed)
Lab Results  Component Value Date   HGBA1C 6.1 08/10/2012   HGBA1C 6.4 04/09/2012   HGBA1C 6.5 01/05/2012     Assessment: Diabetes control: good control (HgbA1C at goal) Progress toward A1C goal:  at goal  Plan: Medications:  Currently controlled by diet Home glucose monitoring: Frequency: no home glucose monitoring Other plans: The patient's most recent HgA1C is 6.1. Plan for f/u at 6 months.

## 2012-10-29 NOTE — Assessment & Plan Note (Signed)
Assessment:  The patient's LE edema is stable on current management of lasix 20 mg bid.  Plan:  I recommended that we continue current management of the patients LE edema. The patient is in agreement with this plan.

## 2012-10-29 NOTE — Assessment & Plan Note (Signed)
BP Readings from Last 3 Encounters:  10/29/12 151/88  10/23/12 130/78  08/31/12 175/78    Lab Results  Component Value Date   NA 139 08/31/2012   K 3.7 08/31/2012   CREATININE 0.96 08/31/2012    Assessment: Blood pressure control: mildly elevated Progress toward BP goal:  deteriorated Comments: The patients BP was mildly elevated at visit.  Plan: Medications:  Continue current medications. Educational resources provided: brochure Self management tools provided: home blood pressure logbook Other plans: Today we elected to not adjust the patients medications as it was an isolated elevation of BP. We plan to follow up a the next visit in 3 months to consider increasing medications.

## 2012-11-01 ENCOUNTER — Telehealth: Payer: Self-pay | Admitting: Internal Medicine

## 2012-11-01 NOTE — Telephone Encounter (Signed)
I have spoke with the daughter and gave her  the time to be at the hospital and the instructions for medications.  She verbalized understanding

## 2012-11-01 NOTE — Progress Notes (Signed)
I saw and evaluated the patient.  I personally confirmed the key portions of the history and exam documented by Dr. Komansky and I reviewed pertinent patient test results.  The assessment, diagnosis, and plan were formulated together and I agree with the documentation in the resident's note.  

## 2012-11-01 NOTE — Telephone Encounter (Addendum)
New Prob Pt daughter has some questions regarding procedure tomorrow. Please call.  Dtr called again - called her back and Amber was able to speak with her. Time to come in, etc. was clarified. No further issues.

## 2012-11-02 ENCOUNTER — Ambulatory Visit (HOSPITAL_COMMUNITY)
Admission: RE | Admit: 2012-11-02 | Discharge: 2012-11-02 | Disposition: A | Discharge status: Home / Self Care | Payer: Medicare HMO | Source: Ambulatory Visit | Attending: Internal Medicine | Admitting: Internal Medicine

## 2012-11-02 ENCOUNTER — Encounter (HOSPITAL_COMMUNITY)
Admission: RE | Disposition: A | Discharge status: Home / Self Care | Payer: Self-pay | Source: Ambulatory Visit | Attending: Internal Medicine

## 2012-11-02 DIAGNOSIS — K5730 Diverticulosis of large intestine without perforation or abscess without bleeding: Secondary | ICD-10-CM | POA: Insufficient documentation

## 2012-11-02 DIAGNOSIS — I080 Rheumatic disorders of both mitral and aortic valves: Secondary | ICD-10-CM | POA: Insufficient documentation

## 2012-11-02 DIAGNOSIS — Z87891 Personal history of nicotine dependence: Secondary | ICD-10-CM | POA: Insufficient documentation

## 2012-11-02 DIAGNOSIS — N393 Stress incontinence (female) (male): Secondary | ICD-10-CM | POA: Insufficient documentation

## 2012-11-02 DIAGNOSIS — Z79899 Other long term (current) drug therapy: Secondary | ICD-10-CM | POA: Insufficient documentation

## 2012-11-02 DIAGNOSIS — E119 Type 2 diabetes mellitus without complications: Secondary | ICD-10-CM | POA: Insufficient documentation

## 2012-11-02 DIAGNOSIS — I10 Essential (primary) hypertension: Secondary | ICD-10-CM | POA: Insufficient documentation

## 2012-11-02 DIAGNOSIS — I5030 Unspecified diastolic (congestive) heart failure: Secondary | ICD-10-CM | POA: Insufficient documentation

## 2012-11-02 DIAGNOSIS — Z45018 Encounter for adjustment and management of other part of cardiac pacemaker: Secondary | ICD-10-CM | POA: Insufficient documentation

## 2012-11-02 DIAGNOSIS — Z7982 Long term (current) use of aspirin: Secondary | ICD-10-CM | POA: Insufficient documentation

## 2012-11-02 DIAGNOSIS — I495 Sick sinus syndrome: Secondary | ICD-10-CM | POA: Insufficient documentation

## 2012-11-02 DIAGNOSIS — D509 Iron deficiency anemia, unspecified: Secondary | ICD-10-CM | POA: Insufficient documentation

## 2012-11-02 DIAGNOSIS — J984 Other disorders of lung: Secondary | ICD-10-CM | POA: Insufficient documentation

## 2012-11-02 DIAGNOSIS — J309 Allergic rhinitis, unspecified: Secondary | ICD-10-CM | POA: Insufficient documentation

## 2012-11-02 DIAGNOSIS — I509 Heart failure, unspecified: Secondary | ICD-10-CM | POA: Insufficient documentation

## 2012-11-02 DIAGNOSIS — M1990 Unspecified osteoarthritis, unspecified site: Secondary | ICD-10-CM | POA: Insufficient documentation

## 2012-11-02 DIAGNOSIS — I4891 Unspecified atrial fibrillation: Secondary | ICD-10-CM | POA: Insufficient documentation

## 2012-11-02 DIAGNOSIS — Z8673 Personal history of transient ischemic attack (TIA), and cerebral infarction without residual deficits: Secondary | ICD-10-CM | POA: Insufficient documentation

## 2012-11-02 HISTORY — PX: PACEMAKER GENERATOR CHANGE: SHX5481

## 2012-11-02 LAB — GLUCOSE, CAPILLARY: Glucose-Capillary: 86 mg/dL (ref 70–99)

## 2012-11-02 SURGERY — PACEMAKER GENERATOR CHANGE
Anesthesia: LOCAL

## 2012-11-02 MED ORDER — SODIUM CHLORIDE 0.9 % IV SOLN: INTRAVENOUS | Status: DC

## 2012-11-02 MED ORDER — FENTANYL CITRATE 0.05 MG/ML IJ SOLN
INTRAMUSCULAR | Status: AC
  Filled 2012-11-02: qty 2

## 2012-11-02 MED ORDER — ONDANSETRON HCL 4 MG/2ML IJ SOLN: 4.0000 mg | Freq: Four times a day (QID) | INTRAMUSCULAR | Status: DC | PRN

## 2012-11-02 MED ORDER — MUPIROCIN 2 % EX OINT
TOPICAL_OINTMENT | CUTANEOUS | Status: AC
  Filled 2012-11-02: qty 22

## 2012-11-02 MED ORDER — MIDAZOLAM HCL 5 MG/5ML IJ SOLN
INTRAMUSCULAR | Status: AC
  Filled 2012-11-02: qty 5

## 2012-11-02 MED ORDER — LIDOCAINE HCL (PF) 1 % IJ SOLN
INTRAMUSCULAR | Status: AC
  Filled 2012-11-02: qty 60

## 2012-11-02 MED ORDER — CEFAZOLIN SODIUM-DEXTROSE 2-3 GM-% IV SOLR
2.0000 g | INTRAVENOUS | Status: DC
  Filled 2012-11-02: qty 50

## 2012-11-02 MED ORDER — SODIUM CHLORIDE 0.9 % IR SOLN
80.0000 mg | Status: DC
  Filled 2012-11-02: qty 2

## 2012-11-02 MED ORDER — MUPIROCIN 2 % EX OINT
TOPICAL_OINTMENT | Freq: Two times a day (BID) | CUTANEOUS | Status: DC
  Administered 2012-11-02: 1 via NASAL
  Filled 2012-11-02: qty 22

## 2012-11-02 MED ORDER — ACETAMINOPHEN 325 MG PO TABS: 325.0000 mg | ORAL_TABLET | ORAL | Status: DC | PRN

## 2012-11-02 MED ORDER — CHLORHEXIDINE GLUCONATE 4 % EX LIQD
60.0000 mL | Freq: Once | CUTANEOUS | Status: DC
  Filled 2012-11-02: qty 60

## 2012-11-02 NOTE — Interval H&P Note (Signed)
History and Physical Interval Note: Patient seen and examined. She is well known to me from prior PPM insertion for tachybrady syndrome. She now has perm atrial fib and will plan to remove her previous PPM which is at [facility] and insert a new device.   11/02/2012 3:28 PM  [name]  has presented today for surgery, with the diagnosis of End of life  The various methods of treatment have been discussed with the patient and family. After consideration of risks, benefits and other options for treatment, the patient has consented to  Procedure(s): PACEMAKER GENERATOR CHANGE (N/A) as a surgical intervention .  The patient's history has been reviewed, patient examined, no change in status, stable for surgery.  I have reviewed the patient's chart and labs.  Questions were answered to the patient's satisfaction.     Leonia Reeves.D.

## 2012-11-02 NOTE — H&P (View-Only) (Signed)
 ELECTROPHYSIOLOGY OFFICE NOTE  Patient ID: Metta E Barbone MRN: [id number], DOB/AGE: 77/21/1927   Date of Visit: 10/23/2012  Primary Physician: BUTCHER,ELIZABETH, MD Primary EP: Gregg Taylor, MD Reason for Visit: EP/device follow-up  History of Present Illness  [name] is a 79 y.o. female with chronic AF and tachy-brady syndrome s/p PPM implant who presents today for routine electrophysiology followup. She is accompanied by her daughter who assists with history questions. Her PPM battery reached ERI on 08/14/2012.  Since last being seen in our clinic, she reports she is doing well and has no complaints. She denies chest pain or shortness of breath. She denies palpitations, dizziness, near syncope or syncope. She denies LE swelling, orthopnea, PND or recent weight gain. She is compliant and tolerating medications without difficulty.  Past Medical History Past Medical History  Diagnosis Date  . Weight loss, unintentional 2010-2011    8/08-8/10 138-146 lbs, between 8/10-10/11 lost 18 lbs., work-up negative  . Incontinence overflow, stress female     on vesicare  . History of sick sinus syndrome s/p pacemaker Dr. Taylor 2000  . Atrial fibrillation     on coumadin followed by Dr. Groce  . History of vaginal bleeding 05/2005    nagative endometrial biopsy  . Restrictive lung disease 1996    PFT's showed mild disease  . Anemia     Iron deficiency, does not tolerate po  . CHF (congestive heart failure)     diastolic dysfunction  . Diabetes mellitus     type 2  diet controlled  . Hypertension     well controlled on 3 agents  . Osteoporosis   . Stroke 2002    R MCA, cardioembolic  . Allergic rhinitis   . Onychomycosis   . History of trichomonal urethritis   . Arthritis   . DJD (degenerative joint disease)   . Constipation   . Hx of hysterectomy   . Pacemaker   . Diverticulosis of colon (without mention of hemorrhage)     Past Surgical History Past Surgical History    Procedure Laterality Date  . Abdominal hysterectomy    . Cholecystectomy    . Pacemaker insertion  2000, Dr. Taylor  . Cataract extraction      Allergies/Intolerances No Known Allergies  Current Home Medications Current Outpatient Prescriptions  Medication Sig Dispense Refill  . aspirin 81 MG tablet Take 1 tablet (81 mg total) by mouth daily.  30 tablet    . docusate sodium (COLACE) 100 MG capsule Take 1 capsule (100 mg total) by mouth daily as needed for constipation.  30 capsule  1  . donepezil (ARICEPT) 5 MG tablet Take 1 tablet (5 mg total) by mouth at bedtime.  30 tablet  2  . ferrous sulfate 300 (60 FE) MG/5ML syrup Take 5 mLs (300 mg total) by mouth daily.  150 mL  3  . furosemide (LASIX) 20 MG tablet TAKE ONE TABLET BY MOUTH TWICE DAILY  30 tablet  0  . glucose blood test strip Use as instructed  100 each  12  . lisinopril (PRINIVIL,ZESTRIL) 40 MG tablet Take 1 tablet (40 mg total) by mouth daily.  30 tablet  5  . metoprolol (LOPRESSOR) 100 MG tablet Take 1 tablet (100 mg total) by mouth 2 (two) times daily.  60 tablet  5  . traMADol (ULTRAM) 50 MG tablet Take 1 tablet (50 mg total) by mouth every 6 (six) hours as needed for pain.  60 tablet  0  .   VESICARE 10 MG tablet TAKE ONE TABLET BY MOUTH ONE TIME DAILY  30 tablet  5  . [DISCONTINUED] solifenacin (VESICARE) 10 MG tablet Take 1 tablet (10 mg total) by mouth daily.  30 tablet  5   No current facility-administered medications for this visit.   Social History History   Social History  . Marital Status: Widowed    Spouse Name: N/A    Number of Children: 8  . Years of Education: N/A   Occupational History  . retired    Social History Main Topics  . Smoking status: Former Smoker  . Smokeless tobacco: Never Used  . Alcohol Use: 0.0 oz/week    0 drink(s) per week     Comment: occasional use  . Drug Use: No  . Sexually Active: Not on file   Other Topics Concern  . Not on file   Social History Narrative    PPM-[employer]   Dtr-[employer] home# [phone]   Cell# [phone]      Patient lives alone, but has 7, of 8, living children who check in on her.   She is widowed.    Review of Systems General: No chills, fever, night sweats or weight changes Cardiovascular: No chest pain, dyspnea on exertion, edema, orthopnea, palpitations, paroxysmal nocturnal dyspnea Dermatological: No rash, lesions or masses Respiratory: No cough, dyspnea Urologic: No hematuria, dysuria Abdominal: No nausea, vomiting, diarrhea, bright red blood per rectum, melena, or hematemesis Neurologic: No visual changes, weakness, changes in mental status All other systems reviewed and are otherwise negative except as noted above.  Physical Exam Vitals: Blood pressure 130/78, pulse 88, height 5' 5" (1.651 m), SpO2 97.00%.  General: Well developed, well appearing 79 y.o. female in no acute distress. HEENT: Normocephalic, atraumatic. EOMs intact. Sclera nonicteric. Oropharynx clear.  Neck: Supple without bruits. No JVD. Lungs: Respirations regular and unlabored, CTA bilaterally. No wheezes, rales or rhonchi. Heart: RRR. S1, S2 present. Soft II/VI systolic murmur at apex. No rub, S3 or S4. Abdomen: Soft, non-distended.  Extremities: No clubbing, cyanosis or edema. DP/PT/Radials 2+ and equal bilaterally. Psych: Normal affect. Neuro: Alert and oriented X 3. Moves all extremities spontaneously.   Diagnostics Device interrogation today - collection limited as PPM battery reached ERI on 08/14/2012; lead impedance stable; sensing appropriate; V paced 12.5% of time  Most recent echo Jan 2014 Study Conclusions - Left ventricle: Systolic function was normal. The estimated ejection fraction was in the range of 50% to 55%. - Aortic valve: Mild regurgitation. - Mitral valve: Mild regurgitation. - Left atrium: The atrium was moderately dilated. - Right atrium: The atrium was mildly dilated. - Tricuspid valve: Moderate regurgitation. -  Pulmonary arteries: Systolic pressure was moderately increased. PA peak pressure: 57mm Hg (S).   Assessment and Plan 1. PPM battery at ERI 2. Tachy-brady syndrome s/p PPM implant 3. Chronic AF  Ms. Jasperson' PPM battery is at ERI. I discussed indication for PPM generator change. I reviewed the procedure including risks and benefits with her and her daughter in detail. These risks include but are not limited to bleeding, infection and lead dislodgement. She and her daughter expressed verbal understanding and agree to proceed. This has been scheduled with Dr. Taylor on 11/02/2012.  Signed, Lorian Yaun, PA-C 10/23/2012, 12:00 PM    

## 2012-11-12 NOTE — CV Procedure (Signed)
EP Procedure Note  Procedure: Removal of a previously implanted PPM and insertion of a new PM  Indication: PPM at ERI in a patient with symptomatic bradycarida due to intermittant AV block and Atrial fibrillation  Findings: After informed consent was obtained, the patient was taken to the diagnostic EP lab in the fasting state. After the usual preparation and draping, 30 cc of Subcutaneous lidocaine was infiltrated over the prior PM insertion site. A 5 cm incision was carried out over the prior PM incision and electrocautery was utilized to dissect down to the PPM pocket. The old Medtronic Sigma PPM was removed and the new Medtronic [place] PM was connected to the old PM lead and placed back in the subcutaneous pocket. The pocket was irrigated with antibiotic irrigation and the incision was closed with 2-0 and 3-0 vicryl suture. Benzoin and steri-strips were painted on the skin, a pressure dressing placed over the incision and the patient was returned to her room in satisfactory condition.   Complications: There were no immediate procedural complications.  Conclusion: Successful removal and insertion of a Medtronic single chamber PPM in a patient with symptomatic bradycardia due to intermittant AV block in the setting of chronic atrial fibrillation.  [name].D.

## 2012-11-14 ENCOUNTER — Ambulatory Visit (INDEPENDENT_AMBULATORY_CARE_PROVIDER_SITE_OTHER): Payer: Medicare HMO | Admitting: *Deleted

## 2012-11-14 ENCOUNTER — Encounter: Payer: Self-pay | Admitting: Internal Medicine

## 2012-11-14 DIAGNOSIS — Z950 Presence of cardiac pacemaker: Secondary | ICD-10-CM

## 2012-11-14 DIAGNOSIS — I4891 Unspecified atrial fibrillation: Secondary | ICD-10-CM

## 2012-11-14 DIAGNOSIS — Z8679 Personal history of other diseases of the circulatory system: Secondary | ICD-10-CM

## 2012-11-14 NOTE — Progress Notes (Addendum)
Pt seen in device clinic for follow up of recently replaced pacemaker.  Wound well healed.  No redness, swelling, or edema.  Steri-strips removed today.   Device interrogated and found to be functioning normally.  No changes made today. See PaceArt for full details.  Pt denies chest pain, shortness of breath, palpitations, or dizziness.  Pt to follow up with Dr. [name] in 3 months.   [name] 11/14/2012 9:53 AM

## 2012-11-15 LAB — PACEMAKER DEVICE OBSERVATION
BATTERY VOLTAGE: 2.8 V
BMOD-0003RV: 30
BMOD-0005RV: 95 {beats}/min
RV LEAD IMPEDENCE PM: 608 Ohm
VENTRICULAR PACING PM: 23

## 2012-11-21 ENCOUNTER — Telehealth: Payer: Self-pay | Admitting: Pharmacist

## 2012-11-21 DIAGNOSIS — I4891 Unspecified atrial fibrillation: Secondary | ICD-10-CM

## 2012-11-21 NOTE — Telephone Encounter (Signed)
Spoke with daughter [name] who confirmed that patient was taken off of warfarin during a recent hospitalization. She is on ASA.

## 2012-11-21 NOTE — Assessment & Plan Note (Signed)
Patient has been taken OFF of warfarin in a recent hospitalization. This was verified by daughter--[name]. She is on ASA.

## 2012-12-04 ENCOUNTER — Encounter: Payer: Self-pay | Admitting: Gastroenterology

## 2013-01-01 ENCOUNTER — Encounter: Payer: Self-pay | Admitting: Internal Medicine

## 2013-01-14 NOTE — Addendum Note (Signed)
Addended by: Neomia Dear on: 01/14/2013 06:06 PM   Modules accepted: Orders

## 2013-01-22 ENCOUNTER — Ambulatory Visit (INDEPENDENT_AMBULATORY_CARE_PROVIDER_SITE_OTHER): Payer: Medicare HMO | Admitting: Internal Medicine

## 2013-01-22 ENCOUNTER — Encounter: Payer: Self-pay | Admitting: Internal Medicine

## 2013-01-22 VITALS — BP 119/79 | HR 69 | Temp 98.8°F | Wt 120.9 lb

## 2013-01-22 DIAGNOSIS — R252 Cramp and spasm: Secondary | ICD-10-CM

## 2013-01-22 DIAGNOSIS — I4891 Unspecified atrial fibrillation: Secondary | ICD-10-CM

## 2013-01-22 DIAGNOSIS — E46 Unspecified protein-calorie malnutrition: Secondary | ICD-10-CM

## 2013-01-22 DIAGNOSIS — E118 Type 2 diabetes mellitus with unspecified complications: Secondary | ICD-10-CM

## 2013-01-22 DIAGNOSIS — N390 Urinary tract infection, site not specified: Secondary | ICD-10-CM | POA: Insufficient documentation

## 2013-01-22 DIAGNOSIS — E538 Deficiency of other specified B group vitamins: Secondary | ICD-10-CM

## 2013-01-22 DIAGNOSIS — R300 Dysuria: Secondary | ICD-10-CM

## 2013-01-22 LAB — POCT URINALYSIS DIPSTICK
Bilirubin, UA: NEGATIVE
Nitrite, UA: NEGATIVE
pH, UA: 5.5

## 2013-01-22 LAB — POCT GLYCOSYLATED HEMOGLOBIN (HGB A1C): Hemoglobin A1C: 5.9

## 2013-01-22 MED ORDER — SULFAMETHOXAZOLE-TMP DS 800-160 MG PO TABS: 1.0000 | ORAL_TABLET | Freq: Two times a day (BID) | ORAL | Status: DC

## 2013-01-22 NOTE — Progress Notes (Signed)
Subjective:   Patient ID: [name] female   DOB: 1925-10-03 79 y.o.   MRN: [id number]  HPI: [name] is a 79 y.o. woman with a pmhx of CVA R MCA, CHF, HTN, DM, Microcytic Anemia, A fib, and ICD 2/2 Sick Sinus Syndrome who presents with a cc of leg cramps weight loss and dysuria.  The patient has dementia, thus her ability to provide history is limited. She has been having dysuria for at least the last few days. It is associated with frequency. She denies fevers, chills nausea, vomiting.   The patient also endorses a 7 lb weight loss associated with increasing foot cramps. The cramps in her feet happen at night. She reports not eatng as much as she used to for the last month or os. She use to drink Ensure, but has stopped a few months ago. She admits to good intake of water. Denies nausea, vomitting diarrhea.  Of note, she has done well since her battery exchange in her pace maker. She is now off coumadin and is on ASA.   Past Medical History  Diagnosis Date  . Weight loss, unintentional 2010-2011    8/08-8/10 138-146 lbs, between 8/10-10/11 lost 18 lbs., work-up negative  . Incontinence overflow, stress female     on vesicare  . History of sick sinus syndrome s/p pacemaker Dr. [name] 2000  . Atrial fibrillation     on coumadin followed by Dr. Alexandria Lodge  . History of vaginal bleeding 05/2005    nagative endometrial biopsy  . Restrictive lung disease 1996    PFT's showed mild disease  . Anemia     Iron deficiency, does not tolerate po  . CHF (congestive heart failure)     diastolic dysfunction  . Diabetes mellitus     type 2  diet controlled  . Hypertension     well controlled on 3 agents  . Osteoporosis   . Stroke 2002    R MCA, cardioembolic  . Allergic rhinitis   . Onychomycosis   . History of trichomonal urethritis   . Arthritis   . DJD (degenerative joint disease)   . Constipation   . Hx of hysterectomy   . Pacemaker   . Diverticulosis of colon  (without mention of hemorrhage)    Current Outpatient Prescriptions  Medication Sig Dispense Refill  . aspirin 81 MG tablet Take 1 tablet (81 mg total) by mouth daily.  30 tablet    . docusate sodium (COLACE) 100 MG capsule Take 1 capsule (100 mg total) by mouth daily as needed for constipation.  30 capsule  1  . donepezil (ARICEPT) 5 MG tablet Take 1 tablet (5 mg total) by mouth at bedtime.  30 tablet  10  . ferrous sulfate 325 (65 FE) MG tablet Take 325 mg by mouth daily with breakfast.      . furosemide (LASIX) 20 MG tablet Take 1 tablet (20 mg total) by mouth 2 (two) times daily.  30 tablet  6  . glucose blood test strip Use as instructed  100 each  12  . lisinopril (PRINIVIL,ZESTRIL) 40 MG tablet Take 1 tablet (40 mg total) by mouth daily.  30 tablet  5  . metoprolol (LOPRESSOR) 100 MG tablet Take 1 tablet (100 mg total) by mouth 2 (two) times daily.  60 tablet  5  . solifenacin (VESICARE) 10 MG tablet Take 10 mg by mouth daily.      . [DISCONTINUED] solifenacin (VESICARE) 10 MG tablet Take  1 tablet (10 mg total) by mouth daily.  30 tablet  5   No current facility-administered medications for this visit.   Family History  Problem Relation Age of Onset  . Diabetes Sister   . Heart disease     History   Social History  . Marital Status: Widowed    Spouse Name: N/A    Number of Children: 8  . Years of Education: N/A   Occupational History  . retired    Social History Main Topics  . Smoking status: Former Games developer  . Smokeless tobacco: Never Used  . Alcohol Use: 0.0 oz/week    0 drink(s) per week     Comment: occasional use  . Drug Use: No  . Sexual Activity: Not on file   Other Topics Concern  . Not on file   Social History Narrative   PPM-[employer]   Nyra Capes home# [phone]   Cell# [phone]      Patient lives alone, but has 7, of 8, living children who check in on her.   She is widowed.   Review of Systems: Pertinent items are noted in HPI. Objective:    Physical Exam: There were no vitals filed for this visit.  Physical Exam  Constitutional: No distress.  In wheelchair. Frail.  HENT:  Head: Normocephalic.  Mouth/Throat: Oropharynx is clear and moist. No oropharyngeal exudate.  Eyes: Pupils are equal, round, and reactive to light.  Cardiovascular: Normal rate, normal heart sounds and intact distal pulses.   Pulmonary/Chest: No respiratory distress. She has no wheezes. She has no rales.  Abdominal: Soft. Bowel sounds are normal.  Mild suprapubic tenderness  Musculoskeletal: Normal range of motion. She exhibits no edema and no tenderness.  Neurological: She is alert.  Skin: No rash noted. She is not diaphoretic. No erythema.     Assessment & Plan:

## 2013-01-22 NOTE — Assessment & Plan Note (Signed)
A: Patient likely has a UTI given UA positive for trace blood,, pos nitrite and leuks.   P: Plan to treat with Bactrim BID 3 days. Will f/u urine culture.

## 2013-01-22 NOTE — Patient Instructions (Addendum)
You have been diagnosed with urinary tract infection. I am planning to treat you with antibiotics for this infection.  I am checking labs due to your weight loss and cramping. I will call you with the results if they are abnormal. I suggest that you start drinking Ensure three times a day in addition to your normal food. Continue to drink water.  Follow up with PCP in 4-6 weeks.

## 2013-01-23 DIAGNOSIS — E46 Unspecified protein-calorie malnutrition: Secondary | ICD-10-CM | POA: Insufficient documentation

## 2013-01-23 LAB — COMPLETE METABOLIC PANEL WITH GFR
AST: 69 U/L — ABNORMAL HIGH (ref 0–37)
Albumin: 4.2 g/dL (ref 3.5–5.2)
BUN: 20 mg/dL (ref 6–23)
Calcium: 9.6 mg/dL (ref 8.4–10.5)
Chloride: 99 mEq/L (ref 96–112)
GFR, Est Non African American: 47 mL/min — ABNORMAL LOW
Glucose, Bld: 126 mg/dL — ABNORMAL HIGH (ref 70–99)
Potassium: 3.8 mEq/L (ref 3.5–5.3)
Sodium: 134 mEq/L — ABNORMAL LOW (ref 135–145)
Total Protein: 7.8 g/dL (ref 6.0–8.3)

## 2013-01-23 LAB — LIPID PANEL
Cholesterol: 130 mg/dL (ref 0–200)
Total CHOL/HDL Ratio: 2 Ratio
VLDL: 30 mg/dL (ref 0–40)

## 2013-01-23 LAB — URINALYSIS, ROUTINE W REFLEX MICROSCOPIC
Bilirubin Urine: NEGATIVE
Glucose, UA: NEGATIVE mg/dL
Ketones, ur: NEGATIVE mg/dL
Protein, ur: NEGATIVE mg/dL
Urobilinogen, UA: 1 mg/dL (ref 0.0–1.0)

## 2013-01-23 LAB — URINALYSIS, MICROSCOPIC ONLY
Casts: NONE SEEN
Crystals: NONE SEEN

## 2013-01-23 LAB — FOLATE: Folate: 10.9 ng/mL

## 2013-01-23 LAB — VITAMIN B12: Vitamin B-12: 413 pg/mL (ref 211–911)

## 2013-01-23 NOTE — Assessment & Plan Note (Signed)
A: The patient has had a weight loss and endorses decreased intake of food. She is having foot cramps at night.   P: Suggest to buy ensure and drink 1 three times daily with normal meals. Will check CMP today for albumin and electrolyte disturbance. Albumin is normal and Electrolytes appear at baseline.  CMP     Component Value Date/Time   NA 134* 01/22/2013 1542   K 3.8 01/22/2013 1542   CL 99 01/22/2013 1542   CO2 24 01/22/2013 1542   GLUCOSE 126* 01/22/2013 1542   BUN 20 01/22/2013 1542   CREATININE 1.07 01/22/2013 1542   CREATININE 1.1 10/29/2012 0840   CALCIUM 9.6 01/22/2013 1542   PROT 7.8 01/22/2013 1542   ALBUMIN 4.2 01/22/2013 1542   AST 69* 01/22/2013 1542   ALT 21 01/22/2013 1542   ALKPHOS 137* 01/22/2013 1542   BILITOT 1.0 01/22/2013 1542   GFRNONAA 54* 05/16/2012 0505   GFRAA 63* 05/16/2012 0505

## 2013-01-25 NOTE — Addendum Note (Signed)
Addended by: Debe Coder B on: 01/25/2013 10:32 AM   Modules accepted: Level of Service

## 2013-01-25 NOTE — Progress Notes (Signed)
I saw and evaluated the patient.  I personally confirmed the key portions of the history and exam documented by Dr. Komanski and I reviewed pertinent patient test results.  The assessment, diagnosis, and plan were formulated together and I agree with the documentation in the resident's note.  

## 2013-02-26 ENCOUNTER — Encounter: Payer: Medicare HMO | Admitting: Internal Medicine

## 2013-02-27 ENCOUNTER — Encounter: Payer: Self-pay | Admitting: Internal Medicine

## 2013-03-05 ENCOUNTER — Ambulatory Visit (INDEPENDENT_AMBULATORY_CARE_PROVIDER_SITE_OTHER): Payer: Medicare HMO | Admitting: Internal Medicine

## 2013-03-05 ENCOUNTER — Encounter: Payer: Self-pay | Admitting: Internal Medicine

## 2013-03-05 VITALS — BP 137/91 | HR 96 | Temp 97.4°F | Wt 120.3 lb

## 2013-03-05 DIAGNOSIS — M6530 Trigger finger, unspecified finger: Secondary | ICD-10-CM

## 2013-03-05 DIAGNOSIS — W19XXXA Unspecified fall, initial encounter: Secondary | ICD-10-CM

## 2013-03-05 DIAGNOSIS — Z9181 History of falling: Secondary | ICD-10-CM

## 2013-03-05 MED ORDER — SOLIFENACIN SUCCINATE 10 MG PO TABS: 10.0000 mg | ORAL_TABLET | Freq: Every day | ORAL | Status: DC

## 2013-03-05 MED ORDER — DICLOFENAC SODIUM 1 % TD GEL: 4.0000 g | Freq: Four times a day (QID) | TRANSDERMAL | Status: DC

## 2013-03-05 MED ORDER — DONEPEZIL HCL 5 MG PO TABS: 5.0000 mg | ORAL_TABLET | Freq: Every day | ORAL | Status: DC

## 2013-03-05 MED ORDER — LISINOPRIL 40 MG PO TABS: 40.0000 mg | ORAL_TABLET | Freq: Every day | ORAL | Status: DC

## 2013-03-05 NOTE — Progress Notes (Addendum)
Subjective:   Patient ID: [name] female   DOB: 09-01-1925 79 y.o.   MRN: [id number]  HPI: [name] is a 79 y.o. woman with a pmhx detailed below who comes to clinic today for fall. The patient fell 1 week ago. The patient states that it was a mechanical fall because she tripped. Aggravating factors are weakness and advancing dementia. The patient struck her head on the floor, but did not lose consciousness. Her daughters came by shortly after to help her. She had trouble getting up. The patient did not go to the ED. The patient is also complaining of mild hip/thigh pain. The patient reports that she is ambulating without problems.     Past Medical History  Diagnosis Date  . Weight loss, unintentional 2010-2011    8/08-8/10 138-146 lbs, between 8/10-10/11 lost 18 lbs., work-up negative  . Incontinence overflow, stress female     on vesicare  . History of sick sinus syndrome s/p pacemaker Dr. [name] 2000  . Atrial fibrillation     on coumadin followed by Dr. Alexandria Lodge  . History of vaginal bleeding 05/2005    nagative endometrial biopsy  . Restrictive lung disease 1996    PFT's showed mild disease  . Anemia     Iron deficiency, does not tolerate po  . CHF (congestive heart failure)     diastolic dysfunction  . Diabetes mellitus     type 2  diet controlled  . Hypertension     well controlled on 3 agents  . Osteoporosis   . Stroke 2002    R MCA, cardioembolic  . Allergic rhinitis   . Onychomycosis   . History of trichomonal urethritis   . Arthritis   . DJD (degenerative joint disease)   . Constipation   . Hx of hysterectomy   . Pacemaker   . Diverticulosis of colon (without mention of hemorrhage)    Current Outpatient Prescriptions  Medication Sig Dispense Refill  . aspirin 81 MG tablet Take 1 tablet (81 mg total) by mouth daily.  30 tablet    . docusate sodium (COLACE) 100 MG capsule Take 1 capsule (100 mg total) by mouth daily as needed for constipation.   30 capsule  1  . donepezil (ARICEPT) 5 MG tablet Take 1 tablet (5 mg total) by mouth at bedtime.  30 tablet  10  . furosemide (LASIX) 20 MG tablet Take 1 tablet (20 mg total) by mouth 2 (two) times daily.  30 tablet  6  . glucose blood test strip Use as instructed  100 each  12  . lisinopril (PRINIVIL,ZESTRIL) 40 MG tablet Take 1 tablet (40 mg total) by mouth daily.  30 tablet  5  . metoprolol (LOPRESSOR) 100 MG tablet Take 1 tablet (100 mg total) by mouth 2 (two) times daily.  60 tablet  5  . ferrous sulfate 325 (65 FE) MG tablet Take 325 mg by mouth daily with breakfast.      . solifenacin (VESICARE) 10 MG tablet Take 10 mg by mouth daily.      Marland Kitchen sulfamethoxazole-trimethoprim (BACTRIM DS) 800-160 MG per tablet Take 1 tablet by mouth 2 (two) times daily. For 3 days.  6 tablet  0  . [DISCONTINUED] solifenacin (VESICARE) 10 MG tablet Take 1 tablet (10 mg total) by mouth daily.  30 tablet  5   No current facility-administered medications for this visit.   Family History  Problem Relation Age of Onset  . Diabetes Sister   .  Heart disease     History   Social History  . Marital Status: Widowed    Spouse Name: N/A    Number of Children: 8  . Years of Education: N/A   Occupational History  . retired    Social History Main Topics  . Smoking status: Former Games developer  . Smokeless tobacco: Never Used  . Alcohol Use: 0.0 oz/week    0 drink(s) per week     Comment: occasional use  . Drug Use: No  . Sexual Activity: None   Other Topics Concern  . None   Social History Narrative   PPM-[employer]   Nyra Capes home# [phone]   Cell# [phone]      Patient lives alone, but has 7, of 8, living children who check in on her.   She is widowed.   Review of Systems: Pertinent items are noted in HPI. Objective:  Physical Exam: Filed Vitals:   03/05/13 1525  BP: 137/91  Pulse: 96  Temp: 97.4 F (36.3 C)  TempSrc: Oral  Weight: 120 lb 4.8 oz (54.568 kg)  SpO2: 100%   Physical Exam    Constitutional: She appears well-developed and well-nourished.  HENT:  Head: Normocephalic.  Mouth/Throat: Oropharynx is clear and moist. No oropharyngeal exudate.  Eyes: EOM are normal. Pupils are equal, round, and reactive to light.  Neck: Normal range of motion. Neck supple.  Cardiovascular: Normal rate, normal heart sounds and intact distal pulses.  Exam reveals no friction rub.   No murmur heard. Pulmonary/Chest: Effort normal and breath sounds normal. No respiratory distress. She has no wheezes.  Abdominal: Soft. Bowel sounds are normal. She exhibits no distension. There is no tenderness.  Musculoskeletal: She exhibits no edema and no tenderness.  Patient has trigger finger on right 4th finger.  Neurological: She is alert. No cranial nerve deficit. Coordination normal.  Motor strength 5/5 in major muscles of UE and LE  Skin: Skin is warm and dry.  Psychiatric:  Pleasantly demented     Assessment & Plan:

## 2013-03-05 NOTE — Patient Instructions (Addendum)
Please follow up with sports medicine about your hand pain. In the meantime use the voltaren gel as needed up to four times a day.  Please contact MD for new or worsening symptoms. Please go to Emergency room if you have another fall.

## 2013-03-06 DIAGNOSIS — W19XXXA Unspecified fall, initial encounter: Secondary | ICD-10-CM | POA: Insufficient documentation

## 2013-03-06 DIAGNOSIS — M6530 Trigger finger, unspecified finger: Secondary | ICD-10-CM | POA: Insufficient documentation

## 2013-03-06 NOTE — Assessment & Plan Note (Signed)
A: The patients fall appears to be due to progression of dementia, physical deconditioning, and residual deficits from CVA. Cardiac etiology appears unlikely given no history of palpations. Seizure also unlikely as no LOC and no bowel or bladder incontinence.  P: I recommended that the patient have a home falls assessment. The patient refused this. The patient is living alone, but has daily help from her daughters. Due to the patients progressing dementia, the patient will likely require increased help. However, at this time I elected to respect the wishes of the patient and her daughter in terms of no increased home help or assessment.

## 2013-03-06 NOTE — Assessment & Plan Note (Signed)
A: The patient appears to have a trigger finger on her right hand 4th finger. Furthermore, she appears to have MCP joint pain due to likely OA.  P: I recommended the patient use voltaren gel on her MCP joints and I have referred the patient to sports medicine for evaluation and treatment of her trigger finger.

## 2013-03-07 NOTE — Progress Notes (Signed)
I saw and evaluated the patient.  I personally confirmed the key portions of the history and exam documented by Dr. Komansky and I reviewed pertinent patient test results.  The assessment, diagnosis, and plan were formulated together and I agree with the documentation in the resident's note.  

## 2013-03-18 ENCOUNTER — Encounter: Payer: Self-pay | Admitting: *Deleted

## 2013-04-04 ENCOUNTER — Encounter: Payer: Self-pay | Admitting: *Deleted

## 2013-04-08 ENCOUNTER — Encounter (HOSPITAL_COMMUNITY): Payer: Self-pay | Admitting: Emergency Medicine

## 2013-04-08 ENCOUNTER — Emergency Department (HOSPITAL_COMMUNITY)
Admission: EM | Admit: 2013-04-08 | Discharge: 2013-04-08 | Disposition: A | Discharge status: Home / Self Care | Payer: Medicare HMO | Attending: Emergency Medicine | Admitting: Emergency Medicine

## 2013-04-08 DIAGNOSIS — M810 Age-related osteoporosis without current pathological fracture: Secondary | ICD-10-CM | POA: Insufficient documentation

## 2013-04-08 DIAGNOSIS — Z90710 Acquired absence of both cervix and uterus: Secondary | ICD-10-CM | POA: Insufficient documentation

## 2013-04-08 DIAGNOSIS — Z87440 Personal history of urinary (tract) infections: Secondary | ICD-10-CM | POA: Insufficient documentation

## 2013-04-08 DIAGNOSIS — Z8709 Personal history of other diseases of the respiratory system: Secondary | ICD-10-CM | POA: Insufficient documentation

## 2013-04-08 DIAGNOSIS — Z950 Presence of cardiac pacemaker: Secondary | ICD-10-CM | POA: Insufficient documentation

## 2013-04-08 DIAGNOSIS — M1990 Unspecified osteoarthritis, unspecified site: Secondary | ICD-10-CM | POA: Insufficient documentation

## 2013-04-08 DIAGNOSIS — M129 Arthropathy, unspecified: Secondary | ICD-10-CM | POA: Insufficient documentation

## 2013-04-08 DIAGNOSIS — Z79899 Other long term (current) drug therapy: Secondary | ICD-10-CM | POA: Insufficient documentation

## 2013-04-08 DIAGNOSIS — Z8679 Personal history of other diseases of the circulatory system: Secondary | ICD-10-CM | POA: Insufficient documentation

## 2013-04-08 DIAGNOSIS — E119 Type 2 diabetes mellitus without complications: Secondary | ICD-10-CM | POA: Insufficient documentation

## 2013-04-08 DIAGNOSIS — J309 Allergic rhinitis, unspecified: Secondary | ICD-10-CM | POA: Insufficient documentation

## 2013-04-08 DIAGNOSIS — N393 Stress incontinence (female) (male): Secondary | ICD-10-CM | POA: Insufficient documentation

## 2013-04-08 DIAGNOSIS — Z8742 Personal history of other diseases of the female genital tract: Secondary | ICD-10-CM | POA: Insufficient documentation

## 2013-04-08 DIAGNOSIS — I10 Essential (primary) hypertension: Secondary | ICD-10-CM | POA: Insufficient documentation

## 2013-04-08 DIAGNOSIS — B070 Plantar wart: Secondary | ICD-10-CM

## 2013-04-08 DIAGNOSIS — D649 Anemia, unspecified: Secondary | ICD-10-CM | POA: Insufficient documentation

## 2013-04-08 DIAGNOSIS — I4891 Unspecified atrial fibrillation: Secondary | ICD-10-CM | POA: Insufficient documentation

## 2013-04-08 DIAGNOSIS — I509 Heart failure, unspecified: Secondary | ICD-10-CM | POA: Insufficient documentation

## 2013-04-08 DIAGNOSIS — Z87891 Personal history of nicotine dependence: Secondary | ICD-10-CM | POA: Insufficient documentation

## 2013-04-08 DIAGNOSIS — Z8719 Personal history of other diseases of the digestive system: Secondary | ICD-10-CM | POA: Insufficient documentation

## 2013-04-08 DIAGNOSIS — Z7982 Long term (current) use of aspirin: Secondary | ICD-10-CM | POA: Insufficient documentation

## 2013-04-08 DIAGNOSIS — Z8673 Personal history of transient ischemic attack (TIA), and cerebral infarction without residual deficits: Secondary | ICD-10-CM | POA: Insufficient documentation

## 2013-04-08 NOTE — ED Provider Notes (Signed)
CSN: [id number]     Arrival date & time 04/08/13  1478 History  This chart was scribed for non-physician practitioner Jaynie Crumble, PA-C, working with Gavin Pound. Oletta Lamas, MD, by Yevette Edwards, ED Scribe. This patient was seen in room TR10C/TR10C and the patient's care was started at 10:56 AM.   First MD Initiated Contact with Patient 04/08/13 1055     Chief Complaint  Patient presents with  . Foot Pain   The history is provided by the patient and a relative. No language interpreter was used.   HPI Comments: [name] is a 79 y.o. female, with a h/o DM,  who presents to the Emergency Department complaining of pain to her left heel which has been present for approximately a week. She reports the pain is increased with ambulation. Nothing makes it better. The pt denies any recent injuries to her foot. She has treated the site with alcohol. No fever/chills. No prior similar symptoms. The pt is a former smoker.   Past Medical History  Diagnosis Date  . Weight loss, unintentional 2010-2011    8/08-8/10 138-146 lbs, between 8/10-10/11 lost 18 lbs., work-up negative  . Incontinence overflow, stress female     on vesicare  . History of sick sinus syndrome s/p pacemaker Dr. [name] 2000  . Atrial fibrillation     on coumadin followed by Dr. Alexandria Lodge  . History of vaginal bleeding 05/2005    nagative endometrial biopsy  . Restrictive lung disease 1996    PFT's showed mild disease  . Anemia     Iron deficiency, does not tolerate po  . CHF (congestive heart failure)     diastolic dysfunction  . Diabetes mellitus     type 2  diet controlled  . Hypertension     well controlled on 3 agents  . Osteoporosis   . Stroke 2002    R MCA, cardioembolic  . Allergic rhinitis   . Onychomycosis   . History of trichomonal urethritis   . Arthritis   . DJD (degenerative joint disease)   . Constipation   . Hx of hysterectomy   . Pacemaker   . Diverticulosis of colon (without mention of  hemorrhage)    Past Surgical History  Procedure Laterality Date  . Abdominal hysterectomy    . Cholecystectomy    . Pacemaker insertion  2000, Dr. [name]  . Cataract extraction     Family History  Problem Relation Age of Onset  . Diabetes Sister   . Heart disease     History  Substance Use Topics  . Smoking status: Former Games developer  . Smokeless tobacco: Never Used  . Alcohol Use: 0.0 oz/week    0 drink(s) per week     Comment: occasional use   No OB history provided.  Review of Systems  Musculoskeletal: Positive for arthralgias.  All other systems reviewed and are negative.   Allergies  Review of patient's allergies indicates no known allergies.  Home Medications   Current Outpatient Rx  Name  Route  Sig  Dispense  Refill  . aspirin 81 MG tablet   Oral   Take 1 tablet (81 mg total) by mouth daily.   30 tablet      . diclofenac sodium (VOLTAREN) 1 % GEL   Topical   Apply 4 g topically 4 (four) times daily.   1 Tube   0   . docusate sodium (COLACE) 100 MG capsule   Oral   Take 1 capsule (100 mg  total) by mouth daily as needed for constipation.   30 capsule   1   . donepezil (ARICEPT) 5 MG tablet   Oral   Take 1 tablet (5 mg total) by mouth at bedtime.   30 tablet   10   . Ferrous Sulfate 220 (44 FE) MG/5ML LIQD   Oral   Take 5 mLs by mouth daily.         . furosemide (LASIX) 20 MG tablet   Oral   Take 1 tablet (20 mg total) by mouth 2 (two) times daily.   30 tablet   6   . lisinopril (PRINIVIL,ZESTRIL) 40 MG tablet   Oral   Take 1 tablet (40 mg total) by mouth daily.   30 tablet   5   . metoprolol (LOPRESSOR) 100 MG tablet   Oral   Take 1 tablet (100 mg total) by mouth 2 (two) times daily.   60 tablet   5   . solifenacin (VESICARE) 10 MG tablet   Oral   Take 1 tablet (10 mg total) by mouth daily.   30 tablet   3   . ferrous sulfate 325 (65 FE) MG tablet   Oral   Take 325 mg by mouth daily with breakfast.          Triage  Vitals: BP 164/101  Pulse 93  Temp(Src) 97.6 F (36.4 C) (Oral)  Resp 18  Ht 5' (1.524 m)  Wt 130 lb (58.968 kg)  BMI 25.39 kg/m2  SpO2 100%  Physical Exam  Nursing note and vitals reviewed. Constitutional: She is oriented to person, place, and time. She appears well-developed and well-nourished. No distress.  HENT:  Head: Normocephalic and atraumatic.  Eyes: EOM are normal.  Neck: Neck supple. No tracheal deviation present.  Cardiovascular: Normal rate.   Pulmonary/Chest: Effort normal. No respiratory distress.  Musculoskeletal: Normal range of motion. She exhibits tenderness.  Neurological: She is alert and oriented to person, place, and time.  Skin: Skin is warm and dry.  Less than 1 cm area to the left heel of skin hardening with tenderness to palpation. There is no surrounding erythema or drainage.   Psychiatric: She has a normal mood and affect. Her behavior is normal.    ED Course  Procedures (including critical care time)  DIAGNOSTIC STUDIES: Oxygen Saturation is 100% on room air, normal by my interpretation.    COORDINATION OF CARE:  11:00 AM- Discussed treatment plan with patient, which includes using warm compresses and OTC plantar wart medication, and the patient agreed to the plan. Also informed the pt she can follow-up with a podiatrist if the symptoms persist. Advised pt to return to the ED if symptoms worsen.   Labs Review Labs Reviewed - No data to display Imaging Review No results found.  EKG Interpretation   None       MDM   1. Plantar wart of left foot     Patient with a small plantar wart versus a plantar callus to the left heel. She has not had any prior injuries to that area. She denies stepping on reading. There is no drainage of the area. There is erythema. I do not suspect this is an abscess or any type of infection. I discussed warm soaks at home as well as over-the-counter plain wart removal products. Also advised her to followup with a  podiatrist for removal if it becomes bigger or more painful. Instructed to return if she develops any fever, redness to  the area, increased pain.  Filed Vitals:   04/08/13 0935  BP: 164/101  Pulse: 93  Temp: 97.6 F (36.4 C)  TempSrc: Oral  Resp: 18  Height: 5' (1.524 m)  Weight: 130 lb (58.968 kg)  SpO2: 100%   I personally performed the services described in this documentation, which was scribed in my presence. The recorded information has been reviewed and is accurate.   Lottie Mussel, PA-C 04/08/13 1539

## 2013-04-08 NOTE — ED Notes (Signed)
Pt has small dark spot on bottom of left foot. States it got sore 2 weeks ago.

## 2013-04-08 NOTE — ED Notes (Signed)
Pt is here with left heel knot.  Pt is diabetic, sugars controlled.  Very small area.  Not draining.

## 2013-04-09 NOTE — ED Provider Notes (Signed)
Medical screening examination/treatment/procedure(s) were performed by non-physician practitioner and as supervising physician I was immediately available for consultation/collaboration.  EKG Interpretation   None         Gavin Pound. Oletta Lamas, MD 04/09/13 1191

## 2013-04-25 ENCOUNTER — Ambulatory Visit (INDEPENDENT_AMBULATORY_CARE_PROVIDER_SITE_OTHER): Payer: Medicare HMO | Admitting: Internal Medicine

## 2013-04-25 ENCOUNTER — Encounter: Payer: Self-pay | Admitting: *Deleted

## 2013-04-25 VITALS — BP 131/69 | HR 113 | Ht 60.0 in | Wt 125.0 lb

## 2013-04-25 DIAGNOSIS — Z950 Presence of cardiac pacemaker: Secondary | ICD-10-CM

## 2013-04-25 DIAGNOSIS — I6350 Cerebral infarction due to unspecified occlusion or stenosis of unspecified cerebral artery: Secondary | ICD-10-CM

## 2013-04-25 DIAGNOSIS — I495 Sick sinus syndrome: Secondary | ICD-10-CM

## 2013-04-25 DIAGNOSIS — I4891 Unspecified atrial fibrillation: Secondary | ICD-10-CM

## 2013-04-25 DIAGNOSIS — I10 Essential (primary) hypertension: Secondary | ICD-10-CM

## 2013-04-25 DIAGNOSIS — I639 Cerebral infarction, unspecified: Secondary | ICD-10-CM

## 2013-04-25 NOTE — Assessment & Plan Note (Signed)
The patient continues to have atrial fibrillation and is not a candidate for systemic anticoagulation. She will remain at risk for stroke. She continues to fall occasionally.

## 2013-04-25 NOTE — Assessment & Plan Note (Signed)
Her Medtronic single-chamber pacemaker is working normally. We'll plan to recheck in several months. 

## 2013-04-25 NOTE — Assessment & Plan Note (Signed)
Her blood pressure is improved. She will maintain a low-sodium diet. No change in medical therapy.

## 2013-04-25 NOTE — Patient Instructions (Signed)
Your physician wants you to follow-up in: July 2015 with Dr. Lovena Le.  You will receive a reminder letter in the mail two months in advance. If you don't receive a letter, please call our office to schedule the follow-up appointment.

## 2013-04-25 NOTE — Progress Notes (Signed)
PCP: Marrion Coy, MD  [name] is a 80 y.o. female who presents today for routine electrophysiology followup.  Since generator change 10/2012, she has done well.  Her wound is well healed.  She has had 2 mechanical falls recently. She denies syncope, and states that her knee gave way.  Today, she denies symptoms of palpitations, chest pain, shortness of breath,  lower extremity edema, dizziness, presyncope, or syncope.  The patient is otherwise without complaint today.   Past Medical History  Diagnosis Date  . Weight loss, unintentional 2010-2011    8/08-8/10 138-146 lbs, between 8/10-10/11 lost 18 lbs., work-up negative  . Incontinence overflow, stress female     on vesicare  . History of sick sinus syndrome s/p pacemaker Dr. Lovena Le 2000  . Atrial fibrillation     on coumadin followed by Dr. Elie Confer  . History of vaginal bleeding 05/2005    nagative endometrial biopsy  . Restrictive lung disease 1996    PFT's showed mild disease  . Anemia     Iron deficiency, does not tolerate po  . CHF (congestive heart failure)     diastolic dysfunction  . Diabetes mellitus     type 2  diet controlled  . Hypertension     well controlled on 3 agents  . Osteoporosis   . Stroke 2002    R MCA, cardioembolic  . Allergic rhinitis   . Onychomycosis   . History of trichomonal urethritis   . Arthritis   . DJD (degenerative joint disease)   . Constipation   . Diverticulosis of colon (without mention of hemorrhage)    Past Surgical History  Procedure Laterality Date  . Abdominal hysterectomy    . Cholecystectomy    . Pacemaker insertion  2000; 2014    MDT Sigma pacemaker implanted by Dr Lovena Le; gen change AdaptaL 10/2012 by Dr Lovena Le  . Cataract extraction      Current Outpatient Prescriptions  Medication Sig Dispense Refill  . aspirin 81 MG tablet Take 1 tablet (81 mg total) by mouth daily.  30 tablet    . diclofenac sodium (VOLTAREN) 1 % GEL Apply 4 g topically 4 (four) times  daily.  1 Tube  0  . docusate sodium (COLACE) 100 MG capsule Take 1 capsule (100 mg total) by mouth daily as needed for constipation.  30 capsule  1  . donepezil (ARICEPT) 5 MG tablet Take 1 tablet (5 mg total) by mouth at bedtime.  30 tablet  10  . Ferrous Sulfate 220 (44 FE) MG/5ML LIQD Take 5 mLs by mouth daily.      . ferrous sulfate 325 (65 FE) MG tablet Take 325 mg by mouth daily with breakfast.      . furosemide (LASIX) 20 MG tablet Take 1 tablet (20 mg total) by mouth 2 (two) times daily.  30 tablet  6  . lisinopril (PRINIVIL,ZESTRIL) 40 MG tablet Take 1 tablet (40 mg total) by mouth daily.  30 tablet  5  . metoprolol (LOPRESSOR) 100 MG tablet Take 1 tablet (100 mg total) by mouth 2 (two) times daily.  60 tablet  5  . solifenacin (VESICARE) 10 MG tablet Take 1 tablet (10 mg total) by mouth daily.  30 tablet  3  . [DISCONTINUED] solifenacin (VESICARE) 10 MG tablet Take 1 tablet (10 mg total) by mouth daily.  30 tablet  5   No current facility-administered medications for this visit.    Physical Exam: There were no vitals filed for  this visit.  GEN- The patient is elderly appearing, alert and oriented x 3 today.   Head- normocephalic, atraumatic Eyes-  Sclera clear, conjunctiva pink Ears- hearing intact Oropharynx- clear Lungs- Clear to ausculation bilaterally, normal work of breathing Chest- pacemaker pocket is well healed Heart- IRegular rate and rhythm, no murmurs, rubs or gallops, PMI not laterally displaced GI- soft, NT, ND, + BS Extremities- no clubbing, cyanosis, or edema  Pacemaker interrogation- reviewed in detail today,  See PACEART report  ECG - atrial fibrillation with a rapid ventricular response  Assessment and Plan:

## 2013-05-02 ENCOUNTER — Encounter: Payer: Self-pay | Admitting: Internal Medicine

## 2013-05-07 NOTE — Addendum Note (Signed)
Addended by: Hulan Fray on: 05/07/2013 08:26 PM   Modules accepted: Orders

## 2013-05-21 ENCOUNTER — Encounter: Payer: Medicare HMO | Admitting: Internal Medicine

## 2013-06-24 ENCOUNTER — Other Ambulatory Visit: Payer: Self-pay | Admitting: Internal Medicine

## 2013-06-24 DIAGNOSIS — Z1231 Encounter for screening mammogram for malignant neoplasm of breast: Secondary | ICD-10-CM

## 2013-07-09 ENCOUNTER — Encounter: Payer: Self-pay | Admitting: Internal Medicine

## 2013-07-09 ENCOUNTER — Ambulatory Visit (INDEPENDENT_AMBULATORY_CARE_PROVIDER_SITE_OTHER): Payer: Medicare HMO | Admitting: Internal Medicine

## 2013-07-09 VITALS — BP 133/60 | HR 89 | Temp 98.3°F | Wt 126.2 lb

## 2013-07-09 DIAGNOSIS — M545 Low back pain, unspecified: Secondary | ICD-10-CM

## 2013-07-09 DIAGNOSIS — M25569 Pain in unspecified knee: Secondary | ICD-10-CM

## 2013-07-09 DIAGNOSIS — Z8639 Personal history of other endocrine, nutritional and metabolic disease: Secondary | ICD-10-CM

## 2013-07-09 DIAGNOSIS — W19XXXA Unspecified fall, initial encounter: Secondary | ICD-10-CM

## 2013-07-09 DIAGNOSIS — Y92009 Unspecified place in unspecified non-institutional (private) residence as the place of occurrence of the external cause: Secondary | ICD-10-CM

## 2013-07-09 DIAGNOSIS — I4891 Unspecified atrial fibrillation: Secondary | ICD-10-CM

## 2013-07-09 DIAGNOSIS — I509 Heart failure, unspecified: Secondary | ICD-10-CM

## 2013-07-09 DIAGNOSIS — D509 Iron deficiency anemia, unspecified: Secondary | ICD-10-CM

## 2013-07-09 DIAGNOSIS — E119 Type 2 diabetes mellitus without complications: Secondary | ICD-10-CM

## 2013-07-09 DIAGNOSIS — I10 Essential (primary) hypertension: Secondary | ICD-10-CM

## 2013-07-09 LAB — CBC WITH DIFFERENTIAL/PLATELET
Basophils Absolute: 0 10*3/uL (ref 0.0–0.1)
Basophils Relative: 0 % (ref 0–1)
Eosinophils Absolute: 0.1 10*3/uL (ref 0.0–0.7)
Eosinophils Relative: 1 % (ref 0–5)
HCT: 33.1 % — ABNORMAL LOW (ref 36.0–46.0)
HEMOGLOBIN: 11.4 g/dL — AB (ref 12.0–15.0)
Lymphocytes Relative: 42 % (ref 12–46)
Lymphs Abs: 2.4 10*3/uL (ref 0.7–4.0)
MCH: 32.4 pg (ref 26.0–34.0)
MCHC: 34.4 g/dL (ref 30.0–36.0)
MCV: 94 fL (ref 78.0–100.0)
MONOS PCT: 11 % (ref 3–12)
Monocytes Absolute: 0.6 10*3/uL (ref 0.1–1.0)
NEUTROS ABS: 2.6 10*3/uL (ref 1.7–7.7)
Neutrophils Relative %: 46 % (ref 43–77)
Platelets: 138 10*3/uL — ABNORMAL LOW (ref 150–400)
RBC: 3.52 MIL/uL — ABNORMAL LOW (ref 3.87–5.11)
RDW: 14.7 % (ref 11.5–15.5)
WBC: 5.7 10*3/uL (ref 4.0–10.5)

## 2013-07-09 LAB — COMPLETE METABOLIC PANEL WITH GFR
ALT: 12 U/L (ref 0–35)
AST: 26 U/L (ref 0–37)
Albumin: 4.3 g/dL (ref 3.5–5.2)
Alkaline Phosphatase: 131 U/L — ABNORMAL HIGH (ref 39–117)
BILIRUBIN TOTAL: 0.7 mg/dL (ref 0.2–1.2)
BUN: 19 mg/dL (ref 6–23)
CO2: 24 meq/L (ref 19–32)
CREATININE: 1.15 mg/dL — AB (ref 0.50–1.10)
Calcium: 9.4 mg/dL (ref 8.4–10.5)
Chloride: 103 mEq/L (ref 96–112)
GFR, Est African American: 50 mL/min — ABNORMAL LOW
GFR, Est Non African American: 43 mL/min — ABNORMAL LOW
Glucose, Bld: 139 mg/dL — ABNORMAL HIGH (ref 70–99)
Potassium: 4.3 mEq/L (ref 3.5–5.3)
Sodium: 138 mEq/L (ref 135–145)
Total Protein: 7.5 g/dL (ref 6.0–8.3)

## 2013-07-09 LAB — GLUCOSE, CAPILLARY: GLUCOSE-CAPILLARY: 102 mg/dL — AB (ref 70–99)

## 2013-07-09 LAB — POCT GLYCOSYLATED HEMOGLOBIN (HGB A1C): Hemoglobin A1C: 6.2

## 2013-07-09 NOTE — Patient Instructions (Addendum)
Your low back pain is likely due to inflammation of the sacroiliac joint. I recommend a 2 week course of tylenol. Please take 650 mg three times daily for the next two weeks. Please take tramadol as needed for severe pain. If the pain continues please contact me.  As we discussed, you are at risk of stroke due to your atrial fibrillation. Due to your previous experience with a likely GI bleed while on coumadin I agree with your plan to not take coumadin.  I am checking your blood work today. I will call you with the results of the blood work.

## 2013-07-09 NOTE — Progress Notes (Signed)
Patient ID: [name], female   DOB: [birth year], 79 y.o.   MRN: [id number]    Subjective:   Patient ID: [name] female   DOB: [birth year] 80 y.o.   MRN: [id number]  HPI: [name] is a 80 y.o. female who presents for a routine follow up. She has an acute complaint of low back pain. The pain started 1 week ago. Walking makes it worse. Resting makes it better. She has no LE pain or weakness. She denies dysesthesias. She denies bowel or bladder changes. She denies fevers and chills. She has not tried anything to help with the pain.    Past Medical History  Diagnosis Date  . Weight loss, unintentional 2010-2011    8/08-8/10 138-146 lbs, between 8/10-10/11 lost 18 lbs., work-up negative  . Incontinence overflow, stress female     on vesicare  . History of sick sinus syndrome s/p pacemaker Dr. Lovena Le 2000  . Atrial fibrillation     on coumadin followed by Dr. Elie Confer  . History of vaginal bleeding 05/2005    nagative endometrial biopsy  . Restrictive lung disease 1996    PFT's showed mild disease  . Anemia     Iron deficiency, does not tolerate po  . CHF (congestive heart failure)     diastolic dysfunction  . Diabetes mellitus     type 2  diet controlled  . Hypertension     well controlled on 3 agents  . Osteoporosis   . Stroke 2002    R MCA, cardioembolic  . Allergic rhinitis   . Onychomycosis   . History of trichomonal urethritis   . Arthritis   . DJD (degenerative joint disease)   . Constipation   . Diverticulosis of colon (without mention of hemorrhage)    Current Outpatient Prescriptions  Medication Sig Dispense Refill  . aspirin 81 MG tablet Take 1 tablet (81 mg total) by mouth daily.  30 tablet    . diclofenac sodium (VOLTAREN) 1 % GEL Apply 4 g topically 4 (four) times daily.  1 Tube  0  . docusate sodium (COLACE) 100 MG capsule Take 1 capsule (100 mg total) by mouth daily as needed for constipation.  30 capsule  1  . donepezil (ARICEPT) 5 MG  tablet Take 1 tablet (5 mg total) by mouth at bedtime.  30 tablet  10  . Ferrous Sulfate 220 (44 FE) MG/5ML LIQD Take 5 mLs by mouth daily.      . furosemide (LASIX) 20 MG tablet Take 1 tablet (20 mg total) by mouth 2 (two) times daily.  30 tablet  6  . lisinopril (PRINIVIL,ZESTRIL) 40 MG tablet Take 1 tablet (40 mg total) by mouth daily.  30 tablet  5  . metoprolol (LOPRESSOR) 100 MG tablet Take 1 tablet (100 mg total) by mouth 2 (two) times daily.  60 tablet  5  . solifenacin (VESICARE) 10 MG tablet Take 1 tablet (10 mg total) by mouth daily.  30 tablet  3  . [DISCONTINUED] solifenacin (VESICARE) 10 MG tablet Take 1 tablet (10 mg total) by mouth daily.  30 tablet  5   No current facility-administered medications for this visit.   Family History  Problem Relation Age of Onset  . Diabetes Sister   . Heart disease     History   Social History  . Marital Status: Widowed    Spouse Name: N/A    Number of Children: 8  . Years of Education: N/A  Occupational History  . retired    Social History Main Topics  . Smoking status: Former Research scientist (life sciences)  . Smokeless tobacco: Never Used  . Alcohol Use: 0.0 oz/week    0 drink(s) per week     Comment: occasional use  . Drug Use: No  . Sexual Activity: None   Other Topics Concern  . None   Social History Narrative   PPM-[employer]   [name] home# [phone]   Cell# [phone]      Patient lives alone, but has 7, of 8, living children who check in on her.   She is widowed.   Review of Systems: Review of Systems  Constitutional: Negative for fever, chills, weight loss, malaise/fatigue and diaphoresis.  HENT: Negative for sore throat.   Respiratory: Negative for cough and shortness of breath.   Cardiovascular: Negative for chest pain, palpitations and orthopnea.  Gastrointestinal: Negative for heartburn, nausea, vomiting, abdominal pain, diarrhea and constipation.  Genitourinary: Negative for dysuria, urgency and frequency.  Musculoskeletal:  Positive for back pain. Negative for falls, joint pain, myalgias and neck pain.  Skin: Negative for itching and rash.  Neurological: Negative for tingling, sensory change, focal weakness, weakness and headaches.    Objective:  Physical Exam: Filed Vitals:   07/09/13 1408  BP: 133/60  Pulse: 89  Temp: 98.3 F (36.8 C)  TempSrc: Oral  Weight: 126 lb 3.2 oz (57.244 kg)  SpO2: 99%   Physical Exam  Constitutional: She appears well-developed and well-nourished. No distress.  HENT:  Head: Normocephalic and atraumatic.  Cardiovascular: Normal rate, regular rhythm, normal heart sounds and intact distal pulses.  Exam reveals no gallop and no friction rub.   No murmur heard. Pulmonary/Chest: Effort normal and breath sounds normal. No respiratory distress. She has no wheezes. She has no rales.  Abdominal: Soft. Bowel sounds are normal. She exhibits no distension. There is no tenderness. There is no rebound and no guarding.  Musculoskeletal:       Lumbar back: She exhibits tenderness, bony tenderness and pain. She exhibits normal range of motion, no swelling, no edema, no deformity, no laceration and no spasm.       Back:  Patient is tender to palpation bil above the posterior superior iliac spine. No midline tenderness. No tenderness or increased tone in the paraspinal muscles.   No LE weakness or change in sensation to light touch  Skin: She is not diaphoretic.     Assessment & Plan:

## 2013-07-10 DIAGNOSIS — M545 Low back pain, unspecified: Secondary | ICD-10-CM | POA: Insufficient documentation

## 2013-07-10 NOTE — Assessment & Plan Note (Signed)
I spoke at length about the risks and benefits of anticoagulation with this patient and her daughter. Coumadin was d/c 1 year ago after being hospitalized with symptomatic anemia thought the be due to a subacute GI bleed. The patient and her daughter state that they do not want to restart coumadin at this point.

## 2013-07-10 NOTE — Assessment & Plan Note (Signed)
BP Readings from Last 3 Encounters:  07/09/13 133/60  04/25/13 131/69  04/08/13 164/101    Lab Results  Component Value Date   NA 138 07/09/2013   K 4.3 07/09/2013   CREATININE 1.15* 07/09/2013    Assessment: Stable.  Plan: Medications:  continue current medications Checking labs today per patient request. F/U in 2-3 months

## 2013-07-10 NOTE — Assessment & Plan Note (Addendum)
The patients low back pain appears to be due to sacroiliitis. The patient does not take NSAIDs (aside from baby ASA) due to GI bleed. I rec tylenol 650 mg TID for the next two weeks. I rec the patient to call me if her pain is continueing to bother her. Her risk factor for compression fracture is osteoporosis. This appears unlikely at this time, but could be considered if she falls to improve. If the pain does not improved I plan to escalate pain meds to include low dose tramadol. Further, I would rec a lumbar xray and referral to sports med for potential SI joint injection.

## 2013-07-10 NOTE — Assessment & Plan Note (Signed)
CBC    Component Value Date/Time   WBC 5.7 07/09/2013 1447   RBC 3.52* 07/09/2013 1447   RBC 1.48* 05/14/2012 1654   HGB 11.4* 07/09/2013 1447   HCT 33.1* 07/09/2013 1447   PLT 138* 07/09/2013 1447   MCV 94.0 07/09/2013 1447   MCH 32.4 07/09/2013 1447   MCHC 34.4 07/09/2013 1447   RDW 14.7 07/09/2013 1447   LYMPHSABS 2.4 07/09/2013 1447   MONOABS 0.6 07/09/2013 1447   EOSABS 0.1 07/09/2013 1447   BASOSABS 0.0 07/09/2013 1447   Patients Hg much improved with PO iron and MCV is now 94.

## 2013-07-10 NOTE — Assessment & Plan Note (Addendum)
Patient has not had a recent fall. She request a knee brace because she thinks it will help her avoid falls. I wrote her a prescription for a knee brace.

## 2013-07-15 NOTE — Progress Notes (Signed)
Case discussed with Dr. Komanski at the time of the visit.  We reviewed the resident's history and exam and pertinent patient test results.  I agree with the assessment, diagnosis and plan of care documented in the resident's note. 

## 2013-08-06 ENCOUNTER — Encounter: Payer: Self-pay | Admitting: Gastroenterology

## 2013-08-14 ENCOUNTER — Ambulatory Visit (HOSPITAL_COMMUNITY): Payer: Medicare HMO

## 2013-08-14 ENCOUNTER — Encounter (INDEPENDENT_AMBULATORY_CARE_PROVIDER_SITE_OTHER): Payer: Self-pay

## 2013-08-19 ENCOUNTER — Encounter (INDEPENDENT_AMBULATORY_CARE_PROVIDER_SITE_OTHER): Payer: Self-pay

## 2013-08-19 ENCOUNTER — Ambulatory Visit (HOSPITAL_COMMUNITY): Payer: Medicare HMO | Attending: Internal Medicine

## 2013-08-21 ENCOUNTER — Other Ambulatory Visit: Payer: Self-pay | Admitting: Internal Medicine

## 2013-09-04 ENCOUNTER — Encounter (HOSPITAL_COMMUNITY): Payer: Self-pay | Admitting: Emergency Medicine

## 2013-09-04 ENCOUNTER — Emergency Department (HOSPITAL_COMMUNITY): Payer: Medicare HMO

## 2013-09-04 ENCOUNTER — Inpatient Hospital Stay (HOSPITAL_COMMUNITY)
Admission: EM | Admit: 2013-09-04 | Discharge: 2013-09-10 | DRG: 872 | Disposition: A | Discharge status: Home / Self Care | Payer: Medicare HMO | Attending: Internal Medicine | Admitting: Internal Medicine

## 2013-09-04 DIAGNOSIS — R7881 Bacteremia: Secondary | ICD-10-CM | POA: Diagnosis present

## 2013-09-04 DIAGNOSIS — E119 Type 2 diabetes mellitus without complications: Secondary | ICD-10-CM | POA: Diagnosis present

## 2013-09-04 DIAGNOSIS — I10 Essential (primary) hypertension: Secondary | ICD-10-CM | POA: Diagnosis present

## 2013-09-04 DIAGNOSIS — K5730 Diverticulosis of large intestine without perforation or abscess without bleeding: Secondary | ICD-10-CM | POA: Diagnosis present

## 2013-09-04 DIAGNOSIS — R32 Unspecified urinary incontinence: Secondary | ICD-10-CM | POA: Diagnosis present

## 2013-09-04 DIAGNOSIS — M1990 Unspecified osteoarthritis, unspecified site: Secondary | ICD-10-CM | POA: Diagnosis present

## 2013-09-04 DIAGNOSIS — I4891 Unspecified atrial fibrillation: Secondary | ICD-10-CM | POA: Diagnosis present

## 2013-09-04 DIAGNOSIS — D696 Thrombocytopenia, unspecified: Secondary | ICD-10-CM | POA: Diagnosis present

## 2013-09-04 DIAGNOSIS — B9620 Unspecified Escherichia coli [E. coli] as the cause of diseases classified elsewhere: Secondary | ICD-10-CM | POA: Diagnosis present

## 2013-09-04 DIAGNOSIS — I509 Heart failure, unspecified: Secondary | ICD-10-CM | POA: Diagnosis present

## 2013-09-04 DIAGNOSIS — N390 Urinary tract infection, site not specified: Secondary | ICD-10-CM | POA: Diagnosis present

## 2013-09-04 DIAGNOSIS — A419 Sepsis, unspecified organism: Principal | ICD-10-CM | POA: Diagnosis present

## 2013-09-04 DIAGNOSIS — Z950 Presence of cardiac pacemaker: Secondary | ICD-10-CM

## 2013-09-04 DIAGNOSIS — Z7982 Long term (current) use of aspirin: Secondary | ICD-10-CM

## 2013-09-04 DIAGNOSIS — I5042 Chronic combined systolic (congestive) and diastolic (congestive) heart failure: Secondary | ICD-10-CM | POA: Diagnosis present

## 2013-09-04 DIAGNOSIS — R509 Fever, unspecified: Secondary | ICD-10-CM

## 2013-09-04 DIAGNOSIS — I495 Sick sinus syndrome: Secondary | ICD-10-CM | POA: Diagnosis present

## 2013-09-04 DIAGNOSIS — Z8673 Personal history of transient ischemic attack (TIA), and cerebral infarction without residual deficits: Secondary | ICD-10-CM

## 2013-09-04 DIAGNOSIS — Z87891 Personal history of nicotine dependence: Secondary | ICD-10-CM

## 2013-09-04 DIAGNOSIS — I5022 Chronic systolic (congestive) heart failure: Secondary | ICD-10-CM | POA: Diagnosis present

## 2013-09-04 DIAGNOSIS — Z833 Family history of diabetes mellitus: Secondary | ICD-10-CM

## 2013-09-04 DIAGNOSIS — D649 Anemia, unspecified: Secondary | ICD-10-CM | POA: Diagnosis present

## 2013-09-04 DIAGNOSIS — M810 Age-related osteoporosis without current pathological fracture: Secondary | ICD-10-CM | POA: Diagnosis present

## 2013-09-04 LAB — URINE MICROSCOPIC-ADD ON

## 2013-09-04 LAB — CBC
HCT: 30.9 % — ABNORMAL LOW (ref 36.0–46.0)
HEMOGLOBIN: 10.5 g/dL — AB (ref 12.0–15.0)
MCH: 32.4 pg (ref 26.0–34.0)
MCHC: 34 g/dL (ref 30.0–36.0)
MCV: 95.4 fL (ref 78.0–100.0)
Platelets: 105 10*3/uL — ABNORMAL LOW (ref 150–400)
RBC: 3.24 MIL/uL — ABNORMAL LOW (ref 3.87–5.11)
RDW: 14.4 % (ref 11.5–15.5)
WBC: 5.8 10*3/uL (ref 4.0–10.5)

## 2013-09-04 LAB — BASIC METABOLIC PANEL
BUN: 14 mg/dL (ref 6–23)
CALCIUM: 9.3 mg/dL (ref 8.4–10.5)
CO2: 22 mEq/L (ref 19–32)
Chloride: 98 mEq/L (ref 96–112)
Creatinine, Ser: 1.16 mg/dL — ABNORMAL HIGH (ref 0.50–1.10)
GFR, EST AFRICAN AMERICAN: 48 mL/min — AB (ref >90)
GFR, EST NON AFRICAN AMERICAN: 42 mL/min — AB (ref >90)
GLUCOSE: 166 mg/dL — AB (ref 70–99)
Potassium: 4.2 mEq/L (ref 3.7–5.3)
SODIUM: 134 meq/L — AB (ref 137–147)

## 2013-09-04 LAB — URINALYSIS, ROUTINE W REFLEX MICROSCOPIC
BILIRUBIN URINE: NEGATIVE
Glucose, UA: NEGATIVE mg/dL
Hgb urine dipstick: NEGATIVE
Ketones, ur: NEGATIVE mg/dL
Nitrite: POSITIVE — AB
PROTEIN: 30 mg/dL — AB
Specific Gravity, Urine: 1.008 (ref 1.005–1.030)
UROBILINOGEN UA: 2 mg/dL — AB (ref 0.0–1.0)
pH: 7 (ref 5.0–8.0)

## 2013-09-04 LAB — I-STAT CG4 LACTIC ACID, ED: LACTIC ACID, VENOUS: 1.14 mmol/L (ref 0.5–2.2)

## 2013-09-04 MED ORDER — CEFTRIAXONE SODIUM 1 G IJ SOLR
1.0000 g | Freq: Once | INTRAMUSCULAR | Status: AC
Start: 2013-09-04 — End: 2013-09-04
  Administered 2013-09-04: 1 g via INTRAVENOUS
  Filled 2013-09-04: qty 10

## 2013-09-04 MED ORDER — SODIUM CHLORIDE 0.9 % IV SOLN: 1000.0000 mL | Freq: Once | INTRAVENOUS | Status: DC

## 2013-09-04 MED ORDER — SODIUM CHLORIDE 0.9 % IV BOLUS (SEPSIS): 1000.0000 mL | Freq: Once | INTRAVENOUS | Status: DC

## 2013-09-04 MED ORDER — ACETAMINOPHEN 325 MG PO TABS
650.0000 mg | ORAL_TABLET | Freq: Once | ORAL | Status: AC
  Administered 2013-09-04: 650 mg via ORAL
  Filled 2013-09-04: qty 2

## 2013-09-04 MED ORDER — SODIUM CHLORIDE 0.9 % IV BOLUS (SEPSIS)
500.0000 mL | Freq: Once | INTRAVENOUS | Status: AC
  Administered 2013-09-04: 500 mL via INTRAVENOUS

## 2013-09-04 MED ORDER — SODIUM CHLORIDE 0.9 % IV SOLN: 1000.0000 mL | INTRAVENOUS | Status: DC

## 2013-09-04 NOTE — ED Notes (Signed)
Pt unable to urinate for urinalysis

## 2013-09-04 NOTE — ED Notes (Signed)
Patient with tachycardia/febrile.  She reports urinary frequency.

## 2013-09-04 NOTE — ED Notes (Signed)
Patient reports she was sitting and started shaking all over. Patient has hx of afib and reports she knows it was her heart.  She denies any chest pain.  Denies sob.  She has pacemaker as well.  Patient is alert and oriented.

## 2013-09-04 NOTE — ED Provider Notes (Signed)
CSN: [id number]     Arrival date & time 09/04/13  1724 History   First MD Initiated Contact with Patient 09/04/13 1751     Chief Complaint  Patient presents with  . Shaking  . Weakness       Patient is a 80 y.o. female with PMH as below and relevant for a fib, CHF, DM, HTN, and anemia who presents with complaints fatigue, palpitations, and urinary frequency.  Patient denies CP, SOB, dysuria, HA, neck pain, rash, or other symptoms.  Family members state that patient has had decreased activity level over the past several days and has been urinating frequently.      (Consider location/radiation/quality/duration/timing/severity/associated sxs/prior Treatment) Patient is a 80 y.o. female presenting with weakness. The history is provided by the patient, medical records and a relative.  Weakness This is a new problem. The current episode started in the past 7 days. The problem occurs constantly. The problem has been gradually worsening. Associated symptoms include nausea and weakness. Pertinent negatives include no abdominal pain, anorexia, chest pain, chills, coughing, diaphoresis, fever, neck pain, swollen glands or urinary symptoms. The symptoms are aggravated by walking. She has tried rest for the symptoms. The treatment provided mild relief.    Past Medical History  Diagnosis Date  . Weight loss, unintentional 2010-2011    8/08-8/10 138-146 lbs, between 8/10-10/11 lost 18 lbs., work-up negative  . Incontinence overflow, stress female     on vesicare  . History of sick sinus syndrome s/p pacemaker Dr. Lovena Le 2000  . Atrial fibrillation     on coumadin followed by Dr. Elie Confer  . History of vaginal bleeding 05/2005    nagative endometrial biopsy  . Restrictive lung disease 1996    PFT's showed mild disease  . Anemia     Iron deficiency, does not tolerate po  . CHF (congestive heart failure)     diastolic dysfunction  . Diabetes mellitus     type 2  diet controlled  . Hypertension     well controlled on 3 agents  . Osteoporosis   . Stroke 2002    R MCA, cardioembolic  . Allergic rhinitis   . Onychomycosis   . History of trichomonal urethritis   . Arthritis   . DJD (degenerative joint disease)   . Constipation   . Diverticulosis of colon (without mention of hemorrhage)    Past Surgical History  Procedure Laterality Date  . Abdominal hysterectomy    . Cholecystectomy    . Pacemaker insertion  2000; 2014    MDT Sigma pacemaker implanted by Dr Lovena Le; gen change AdaptaL 10/2012 by Dr Lovena Le  . Cataract extraction     Family History  Problem Relation Age of Onset  . Diabetes Sister   . Heart disease     History  Substance Use Topics  . Smoking status: Former Research scientist (life sciences)  . Smokeless tobacco: Never Used  . Alcohol Use: 0.0 oz/week    0 drink(s) per week     Comment: occasional use   OB History   Grav Para Term Preterm Abortions TAB SAB Ect Mult Living                 Review of Systems  Constitutional: Negative for fever, chills and diaphoresis.  Respiratory: Negative for cough.   Cardiovascular: Negative for chest pain.  Gastrointestinal: Positive for nausea. Negative for abdominal pain and anorexia.  Musculoskeletal: Negative for neck pain.  Neurological: Positive for weakness.  All other systems reviewed and are  negative.     Allergies  Review of patient's allergies indicates no known allergies.  Home Medications   Prior to Admission medications   Medication Sig Start Date End Date Taking? Authorizing Provider  aspirin 81 MG tablet Take 1 tablet (81 mg total) by mouth daily. 01/05/12  Yes Maitri S Kalia-Reynolds, DO  diclofenac sodium (VOLTAREN) 1 % GEL Apply 2 g topically 4 (four) times daily as needed (pain).   Yes Historical Provider, MD  docusate sodium (COLACE) 100 MG capsule Take 1 capsule (100 mg total) by mouth daily as needed for constipation. 10/29/12 10/29/13 Yes Marrion Coy, MD  donepezil (ARICEPT) 5 MG tablet Take 1 tablet (5  mg total) by mouth at bedtime. 03/05/13  Yes Marrion Coy, MD  furosemide (LASIX) 20 MG tablet Take 1 tablet (20 mg total) by mouth 2 (two) times daily. 10/29/12  Yes Marrion Coy, MD  lisinopril (PRINIVIL,ZESTRIL) 40 MG tablet Take 1 tablet (40 mg total) by mouth daily. 03/05/13  Yes Marrion Coy, MD  metoprolol (LOPRESSOR) 100 MG tablet Take 1 tablet (100 mg total) by mouth 2 (two) times daily. 10/29/12  Yes Marrion Coy, MD  solifenacin (VESICARE) 10 MG tablet Take 10 mg by mouth daily.   Yes Historical Provider, MD   BP 131/78  Pulse 138  Temp(Src) 103.2 F (39.6 C) (Oral)  Resp 18  Ht 5' (1.524 m)  Wt 118 lb (53.524 kg)  BMI 23.05 kg/m2  SpO2 98% Physical Exam  Nursing note and vitals reviewed. Constitutional: She is oriented to person, place, and time. She appears well-developed and well-nourished. No distress.  HENT:  Head: Normocephalic and atraumatic.  Right Ear: External ear normal.  Left Ear: External ear normal.  Eyes: Conjunctivae are normal. Pupils are equal, round, and reactive to light.  Neck: Normal range of motion. Neck supple.  Cardiovascular: Intact distal pulses.  An irregularly irregular rhythm present. Tachycardia present.   Pulmonary/Chest: Effort normal and breath sounds normal. She has no wheezes. She has no rales.  Abdominal: Soft. Bowel sounds are normal. She exhibits no distension. There is tenderness (mild SP TTP).  Musculoskeletal: Normal range of motion. She exhibits no edema and no tenderness.  No CVA TTP  Neurological: She is alert and oriented to person, place, and time. No cranial nerve deficit. Coordination normal.  Skin: Skin is warm and dry. No rash noted.  Psychiatric: She has a normal mood and affect.    ED Course  Procedures (including critical care time) Labs Review Labs Reviewed  CBC - Abnormal; Notable for the following:    RBC 3.24 (*)    Hemoglobin 10.5 (*)    HCT 30.9 (*)    Platelets 105 (*)     All other components within normal limits  BASIC METABOLIC PANEL - Abnormal; Notable for the following:    Sodium 134 (*)    Glucose, Bld 166 (*)    Creatinine, Ser 1.16 (*)    GFR calc non Af Amer 42 (*)    GFR calc Af Amer 48 (*)    All other components within normal limits  URINALYSIS, ROUTINE W REFLEX MICROSCOPIC - Abnormal; Notable for the following:    APPearance CLOUDY (*)    Protein, ur 30 (*)    Urobilinogen, UA 2.0 (*)    Nitrite POSITIVE (*)    Leukocytes, UA LARGE (*)    All other components within normal limits  URINE MICROSCOPIC-ADD ON - Abnormal; Notable for the following:    Bacteria, UA  MANY (*)    All other components within normal limits  URINE CULTURE  CULTURE, BLOOD (ROUTINE X 2)  CULTURE, BLOOD (ROUTINE X 2)  I-STAT CG4 LACTIC ACID, ED    Imaging Review Dg Chest Port 1 View  09/04/2013   CLINICAL DATA:  Weakness and history of CHF  EXAM: PORTABLE CHEST - 1 VIEW  COMPARISON:  05/14/2012.  FINDINGS: The heart is enlarged but stable. There is tortuosity and calcification of the thoracic aorta. There is central vascular congestion and mild interstitial pulmonary edema consistent with CHF. No pleural effusions. The pacer wires stable. The bony thorax is intact.  IMPRESSION: CHF.   Electronically Signed   By: Kalman Jewels M.D.   On: 09/04/2013 18:18     EKG Interpretation None      Date: 09/04/2013  Rate: 124  Rhythm: atrial fibrillation  QRS Axis: normal  Intervals: other than no PR wnl  ST/T Wave abnormalities: Non specific ST changes in lateral leads  Conduction Disutrbances:none  Narrative Interpretation:   Old EKG Reviewed: unchanged     MDM   Final diagnoses:  Fever  Atrial fibrillation with RVR  Sepsis  UTI (lower urinary tract infection)    Patient presents to the ED with complaints of palpitations, fatigue, and urinary frequency.  Upon arrival in the ED patient found to be febrile and tachycardic.  EKG shows a fib with RVR but  otherwise unremarkable.  PE as above and remarkable for mild SP TTP.  Code sepsis initiated and patient given IV ceftriaxone given concern for urinary source and no RF for health care associated infections.  Review of results shows no PNA on CXR, lactate wnl, no leukocytosis, but UA c/w UTI.  Given patient lives alone, sepsis, and need for monitoring it was felt that admission was needed.  IM teaching service consulted and patient to be admitted to floor.  With tylenol and small IVF bolus, HR noted to be less than 100.      Corlis Leak, MD 09/05/13 [phone]

## 2013-09-04 NOTE — H&P (Signed)
Date: 09/04/2013               Patient Name:  [name] MRN: [id number]  DOB: [birth year] Age / Sex: 80 y.o., female   PCP: Marrion Coy, MD         Medical Service: Internal Medicine Teaching Service         Attending Physician: Dr. Madilyn Fireman, MD    First Contact: Dr. Gordy Levan Pager: [phone]  Second Contact: Dr. Aundra Dubin Pager: [phone]       After Hours (After 5p/  First Contact Pager: [phone]  weekends / holidays): Second Contact Pager: [phone]   Chief Complaint: Fatigue, malaise, increased urinary frequency  History of Present Illness: [name] is a 80 y.o. female w/ PMHx of HTN, A-fib (not on Coumadin, previously stopped 22/ sub-acute GIB), dCHF, sick sinus syndrome s/p PPM placement, h/o CVA 2002 (R MCA, cardioembolic), OA, and DM type II (not on any medications), presents to the ED w/ complaints of malaise, fatigue, increased urinary frequency and palpitations. The patient lives by herself and claims she started feeling unwell about 5 days ago, starting w/ suprapubic pain, increased urinary frequency, urgency and subjective fever. Starting yesterday, she claims that she started having rigors at home, and most recently, felt that she was having palpitations, causing her to come to the ED. [name]'s daughters were also at bedside, saying they checked her BP at home which was wnl, however, her HR was 135-140. The patient otherwise denies chest pain, SOB, dizziness, nausea, and vomiting. She does however say she has had decreased appetite and po intake over the past few days. On arrival to the ED, patient was noted to have tachycardia into the 120's, and fever of 103.2. Patient normotensive, given 500 cc NS bolus w/ improvement in tachycardia, fever controlled w/ tylenol. Started on Rocephin IV for UTI.   Meds: No current facility-administered medications for this encounter.   Current Outpatient Prescriptions  Medication Sig Dispense Refill  . aspirin 81  MG tablet Take 1 tablet (81 mg total) by mouth daily.  30 tablet    . diclofenac sodium (VOLTAREN) 1 % GEL Apply 2 g topically 4 (four) times daily as needed (pain).      Marland Kitchen docusate sodium (COLACE) 100 MG capsule Take 1 capsule (100 mg total) by mouth daily as needed for constipation.  30 capsule  1  . donepezil (ARICEPT) 5 MG tablet Take 1 tablet (5 mg total) by mouth at bedtime.  30 tablet  10  . furosemide (LASIX) 20 MG tablet Take 1 tablet (20 mg total) by mouth 2 (two) times daily.  30 tablet  6  . lisinopril (PRINIVIL,ZESTRIL) 40 MG tablet Take 1 tablet (40 mg total) by mouth daily.  30 tablet  5  . metoprolol (LOPRESSOR) 100 MG tablet Take 1 tablet (100 mg total) by mouth 2 (two) times daily.  60 tablet  5  . solifenacin (VESICARE) 10 MG tablet Take 10 mg by mouth daily.      . [DISCONTINUED] solifenacin (VESICARE) 10 MG tablet Take 1 tablet (10 mg total) by mouth daily.  30 tablet  5    Allergies: Allergies as of 09/04/2013  . (No Known Allergies)   Past Medical History  Diagnosis Date  . Weight loss, unintentional 2010-2011    8/08-8/10 138-146 lbs, between 8/10-10/11 lost 18 lbs., work-up negative  . Incontinence overflow, stress female     on vesicare  . History of sick sinus syndrome  s/p pacemaker Dr. Lovena Le 2000  . Atrial fibrillation     on coumadin followed by Dr. Elie Confer  . History of vaginal bleeding 05/2005    nagative endometrial biopsy  . Restrictive lung disease 1996    PFT's showed mild disease  . Anemia     Iron deficiency, does not tolerate po  . CHF (congestive heart failure)     diastolic dysfunction  . Diabetes mellitus     type 2  diet controlled  . Hypertension     well controlled on 3 agents  . Osteoporosis   . Stroke 2002    R MCA, cardioembolic  . Allergic rhinitis   . Onychomycosis   . History of trichomonal urethritis   . Arthritis   . DJD (degenerative joint disease)   . Constipation   . Diverticulosis of colon (without mention of  hemorrhage)    Past Surgical History  Procedure Laterality Date  . Abdominal hysterectomy    . Cholecystectomy    . Pacemaker insertion  2000; 2014    MDT Sigma pacemaker implanted by Dr Lovena Le; gen change AdaptaL 10/2012 by Dr Lovena Le  . Cataract extraction     Family History  Problem Relation Age of Onset  . Diabetes Sister   . Heart disease     History   Social History  . Marital Status: Widowed    Spouse Name: N/A    Number of Children: 8  . Years of Education: N/A   Occupational History  . retired    Social History Main Topics  . Smoking status: Former Research scientist (life sciences)  . Smokeless tobacco: Never Used  . Alcohol Use: 0.0 oz/week    0 drink(s) per week     Comment: occasional use  . Drug Use: No  . Sexual Activity: Not on file   Other Topics Concern  . Not on file   Social History Narrative   [employer]   [name] home# [phone]   Cell# [phone]      Patient lives alone, but has 7, of 8, living children who check in on her.   She is widowed.    Review of Systems: General: Positive for fever, chills, decreased appetite, and fatigue. Denies fever, chills, diaphoresis.  Respiratory: Denies SOB, DOE, cough, chest tightness, and wheezing.   Cardiovascular: Positive for palpitations. Denies chest pain.  Gastrointestinal: Positive for suprapubic pain. Denies nausea, vomiting, diarrhea, constipation, blood in stool and abdominal distention.  Genitourinary: Positive for dysuria, urgency, and frequency. Denies hematuria, and flank pain. Endocrine: Denies hot or cold intolerance, polyuria, and polydipsia. Musculoskeletal: Positive for knee pain. Denies myalgias, back pain, joint swelling, and gait problem.  Skin: Denies pallor, rash and wounds.  Neurological: Denies dizziness, seizures, syncope, weakness, lightheadedness, numbness and headaches.  Psychiatric/Behavioral: Denies mood changes, confusion, nervousness, sleep disturbance and agitation.    Physical  Exam: Filed Vitals:   09/04/13 2030 09/04/13 2100 09/04/13 2130 09/04/13 2200  BP: 126/63 114/59 119/53 128/66  Pulse: 96 71 71 88  Temp:      TempSrc:      Resp: 31 17 27 18   Height:      Weight:      SpO2: 100% 99% 99% 100%   General: Vital signs reviewed.  Patient is an elderly female, in no acute distress and cooperative with exam.  Head: Normocephalic and atraumatic. Eyes: PERRL, EOMI, conjunctivae normal, No scleral icterus.  Neck: Supple, trachea midline, normal ROM, No JVD, masses, thyromegaly, or carotid bruit present.  Cardiovascular: Regular  rate, rhythm irregularly irregular, S1 normal, S2 normal, no murmurs, gallops, or rubs. Pulmonary/Chest: Air entry equal bilaterally, no wheezes, rales, or rhonchi. Abdominal: Soft, mild TTP in suprapubic region, non-distended, BS +, no masses, organomegaly, or guarding present.  Musculoskeletal: No joint deformities, erythema, or stiffness, ROM full and nontender. Extremities: Trace pitting edema, pulses symmetric and intact bilaterally. No cyanosis or clubbing. Neurological: A&O x3, Strength is normal and symmetric bilaterally, cranial nerve II-XII are grossly intact, no focal motor deficit, sensory intact to light touch bilaterally.  Skin: Warm, dry and intact. No rashes or erythema. Psychiatric: Normal mood and affect. Speech somewhat slurred (at baseline), behavior is normal. Cognition and memory are normal.    Lab results: Basic Metabolic Panel:  Recent Labs  09/04/13 1754  NA 134*  K 4.2  CL 98  CO2 22  GLUCOSE 166*  BUN 14  CREATININE 1.16*  CALCIUM 9.3   CBC:  Recent Labs  09/04/13 1754  WBC 5.8  HGB 10.5*  HCT 30.9*  MCV 95.4  PLT 105*   Urinalysis:  Recent Labs  09/04/13 2030  COLORURINE YELLOW  LABSPEC 1.008  PHURINE 7.0  GLUCOSEU NEGATIVE  HGBUR NEGATIVE  BILIRUBINUR NEGATIVE  KETONESUR NEGATIVE  PROTEINUR 30*  UROBILINOGEN 2.0*  NITRITE POSITIVE*  LEUKOCYTESUR LARGE*    Imaging  results:  Dg Chest Port 1 View  09/04/2013   CLINICAL DATA:  Weakness and history of CHF  EXAM: PORTABLE CHEST - 1 VIEW  COMPARISON:  05/14/2012.  FINDINGS: The heart is enlarged but stable. There is tortuosity and calcification of the thoracic aorta. There is central vascular congestion and mild interstitial pulmonary edema consistent with CHF. No pleural effusions. The pacer wires stable. The bony thorax is intact.  IMPRESSION: CHF.   Electronically Signed   By: Kalman Jewels M.D.   On: 09/04/2013 18:18    Other results: EKG: A-fib, rate of 124. Infrequent pacing spike w/ resulting PVC. Twi's in leads I, II, V5, V6, present in previous EKG.   Assessment & Plan by Problem: Ms. ROMILLY ALEXIOU is a 80 y.o. female w/ PMHx of HTN, A-fib (not on Coumadin, previously stopped 22/ sub-acute GIB), dCHF, sick sinus syndrome s/p PPM placement, h/o CVA 2002 (R MCA, cardioembolic), DM type II (not on any medications), and urinary incontinence, admitted for sepsis 2/2 UTI.  UTI- Patient w/ dysuria, increased frequency, urgency, fever,chills, and general malaise for 5-7 days. On admission, patient noted to be tachycardic to the 120's, fever of 103.2. UA showed 30 protein, positive nitrites, large leukocytes, 11-20 WBC's, and many bacteria. Patient w/out leukocytosis. Given 500 cc bolus of NS, significant improvement of tachycardia. Called as Code Sepsis in ED, started on Rocephin IV. Fever well controlled w/ Tylenol. Lactic acid noted to be 1.14. Patient w/ previous h/o streptococcal UTI in 2014.  -Admit to med-surg -Continue Rocephin IV -Urine culture pending -Blood culture pending -Orthostatic vital signs -CBC, BMP in AM -I/O's -Continue Tylenol prn for fever  A-fib- Patient admitted w/ tachycardia into the 120's, daughters say rate was 130-140 at home. Patient also admits to having palpitations, symptoms causing her to come to the ED. Tachycardia resolved w/ 500 cc bolus and fever control. Ms. Stoltz  has previously been on Coumadin for anti-coagulation, however, she was found to have symptomatic anemia in the setting of a sub-acute GIB, therefore it has been stopped. Currently only on ASA. Patient has a known h/o cardioembolic stroke in the past as well. Follows w/ Dr. Lovena Le, has  PPM for sick sinus syndrome as well.  -Cardiac monitoring -Hold Metoprolol for now; restart in AM -EKG in AM  [facility]- Patient w/ most recent ECHO from 05/14/13 shows EF of 50-55%, moderate left atrial dilation, PA pressure of 57 mm Hg. Takes Lasix 20 mg po bid + Lisinopril 40 mg po qd + Lopressor 100 mg po bid. No signs or symptoms of CHF exacerbation at this time. -Hold Lasix, Metoprolol, Lisinopril for now. Restart in AM if BP can tolerate.   HTN- Normotensive on admission. On Lasix, Lisinopril, and Lopressor as above at home.  -Hold home meds for now; restart in AM if BP can tolerate.   Urinary Incontinence- On Vesicare 10 mg po qd at home.  -Hold Vesicare  DM type II- Controlled w/ diet alone.  -BMP in AM  DVT/PE PPx- SCD's  Dispo: Disposition is deferred at this time, awaiting improvement of current medical problems. Anticipated discharge in approximately 1-2 day(s).   The patient does have a current PCP Marrion Coy, MD) and does need an [facility] hospital follow-up appointment after discharge.  The patient does not have transportation limitations that hinder transportation to clinic appointments.  Signed: Corky Sox, MD 09/04/2013, 10:42 PM

## 2013-09-04 NOTE — ED Notes (Signed)
CG-4 shown to Dr. Kunz 

## 2013-09-05 ENCOUNTER — Encounter (HOSPITAL_COMMUNITY): Payer: Self-pay | Admitting: Urology

## 2013-09-05 DIAGNOSIS — B9689 Other specified bacterial agents as the cause of diseases classified elsewhere: Secondary | ICD-10-CM

## 2013-09-05 DIAGNOSIS — I519 Heart disease, unspecified: Secondary | ICD-10-CM

## 2013-09-05 DIAGNOSIS — R7881 Bacteremia: Secondary | ICD-10-CM

## 2013-09-05 DIAGNOSIS — I4891 Unspecified atrial fibrillation: Secondary | ICD-10-CM

## 2013-09-05 DIAGNOSIS — R32 Unspecified urinary incontinence: Secondary | ICD-10-CM

## 2013-09-05 DIAGNOSIS — N390 Urinary tract infection, site not specified: Secondary | ICD-10-CM

## 2013-09-05 DIAGNOSIS — E119 Type 2 diabetes mellitus without complications: Secondary | ICD-10-CM

## 2013-09-05 DIAGNOSIS — I10 Essential (primary) hypertension: Secondary | ICD-10-CM

## 2013-09-05 LAB — BASIC METABOLIC PANEL
BUN: 12 mg/dL (ref 6–23)
CO2: 16 mEq/L — ABNORMAL LOW (ref 19–32)
Calcium: 9 mg/dL (ref 8.4–10.5)
Chloride: 103 mEq/L (ref 96–112)
Creatinine, Ser: 0.94 mg/dL (ref 0.50–1.10)
GFR calc Af Amer: 62 mL/min — ABNORMAL LOW (ref >90)
GFR, EST NON AFRICAN AMERICAN: 54 mL/min — AB (ref >90)
GLUCOSE: 122 mg/dL — AB (ref 70–99)
POTASSIUM: 4.3 meq/L (ref 3.7–5.3)
SODIUM: 138 meq/L (ref 137–147)

## 2013-09-05 LAB — CBC
HCT: 32.5 % — ABNORMAL LOW (ref 36.0–46.0)
HEMOGLOBIN: 10.8 g/dL — AB (ref 12.0–15.0)
MCH: 32.2 pg (ref 26.0–34.0)
MCHC: 33.2 g/dL (ref 30.0–36.0)
MCV: 97 fL (ref 78.0–100.0)
PLATELETS: 99 10*3/uL — AB (ref 150–400)
RBC: 3.35 MIL/uL — AB (ref 3.87–5.11)
RDW: 14.8 % (ref 11.5–15.5)
WBC: 6.3 10*3/uL (ref 4.0–10.5)

## 2013-09-05 LAB — TROPONIN I: Troponin I: <0.3 ng/mL (ref <0.30)

## 2013-09-05 MED ORDER — SODIUM CHLORIDE 0.9 % IJ SOLN
3.0000 mL | Freq: Two times a day (BID) | INTRAMUSCULAR | Status: DC
  Administered 2013-09-05 – 2013-09-10 (×12): 3 mL via INTRAVENOUS

## 2013-09-05 MED ORDER — METOPROLOL TARTRATE 100 MG PO TABS
100.0000 mg | ORAL_TABLET | Freq: Two times a day (BID) | ORAL | Status: DC
  Administered 2013-09-05: 100 mg via ORAL
  Filled 2013-09-05: qty 1

## 2013-09-05 MED ORDER — ACETAMINOPHEN 325 MG PO TABS
325.0000 mg | ORAL_TABLET | Freq: Four times a day (QID) | ORAL | Status: DC | PRN
  Administered 2013-09-05: 325 mg via ORAL
  Filled 2013-09-05: qty 1

## 2013-09-05 MED ORDER — SODIUM CHLORIDE 0.9 % IV SOLN
INTRAVENOUS | Status: AC
  Administered 2013-09-05: 01:00:00 via INTRAVENOUS

## 2013-09-05 MED ORDER — DEXTROSE 5 % IV SOLN
1.0000 g | INTRAVENOUS | Status: DC
  Administered 2013-09-05 – 2013-09-09 (×5): 1 g via INTRAVENOUS
  Filled 2013-09-05 (×6): qty 10

## 2013-09-05 MED ORDER — ASPIRIN 81 MG PO CHEW
81.0000 mg | CHEWABLE_TABLET | Freq: Every day | ORAL | Status: DC
  Administered 2013-09-05 – 2013-09-10 (×6): 81 mg via ORAL
  Filled 2013-09-05 (×7): qty 1

## 2013-09-05 MED ORDER — METOPROLOL TARTRATE 100 MG PO TABS
100.0000 mg | ORAL_TABLET | Freq: Two times a day (BID) | ORAL | Status: DC
  Administered 2013-09-06 – 2013-09-10 (×9): 100 mg via ORAL
  Filled 2013-09-05 (×10): qty 1

## 2013-09-05 NOTE — Progress Notes (Addendum)
Subjective:   Pt admitted with UTI.  She reports polyuria, dysuria occasionally, fever, and chills.  She denies CP, SOB, N/V.  She lives alone but her daughters are close by and drop in.  She is alert and oriented x3. According to the patient and daughters, she was feeling weaker for about a week and had decreased appetite (which she admits that her appetite waxes and wanes).     Objective:   Vital signs in last 24 hours: Filed Vitals:   09/05/13 0541 09/05/13 0652 09/05/13 1048 09/05/13 1520  BP: 140/60  122/68 117/58  Pulse:   84 80  Temp:  99.1 F (37.3 C) 98.2 F (36.8 C) 98.3 F (36.8 C)  TempSrc:  Oral Oral Oral  Resp:   18 18  Height:      Weight:      SpO2:   100% 100%    Weight: Filed Weights   09/04/13 1733 09/05/13 0053  Weight: 118 lb (53.524 kg) 125 lb 10.6 oz (57 kg)    Ins/Outs:  Intake/Output Summary (Last 24 hours) at 09/05/13 1522 Last data filed at 09/05/13 1054  Gross per 24 hour  Intake    603 ml  Output    850 ml  Net   -247 ml    Physical Exam: Constitutional: Vital signs reviewed.  Elderly, frail appearing female sitting up in bed in no acute distress and cooperative with exam.   HEENT: [place]/AT; PERRL, EOMI, conjunctivae normal/pale, no scleral icterus  Cardiovascular: irregular rhythm, no MRG Pulmonary/Chest: normal respiratory effort, no accessory muscle use, mild bibasilar crackles Abdominal: Soft. +BS, NT/ND Neurological: A&O x3, CN II-XII grossly intact; non-focal exam Extremities: 2+DP b/l, no C/C/E  Skin: Warm, dry and intact. No rash  Lab Results:  BMP:  Recent Labs Lab 09/04/13 1754 09/05/13 0539  NA 134* 138  K 4.2 4.3  CL 98 103  CO2 22 16*  GLUCOSE 166* 122*  BUN 14 12  CREATININE 1.16* 0.94  CALCIUM 9.3 9.0   Anion Gap:  14  CBC:  Recent Labs Lab 09/04/13 1754 09/05/13 0539  WBC 5.8 6.3  HGB 10.5* 10.8*  HCT 30.9* 32.5*  MCV 95.4 97.0  PLT 105* 99*    Coagulation: No results found for this  basename: LABPROT, INR,  in the last 168 hours  CBG:           No results found for this basename: GLUCAP,  in the last 168 hours         LFTs: No results found for this basename: AST, ALT, ALKPHOS, BILITOT, PROT, ALBUMIN,  in the last 168 hours  Pancreatic Enzymes: No results found for this basename: LIPASE, AMYLASE,  in the last 168 hours  Lactic Acid/Procalcitonin:  Recent Labs Lab 09/04/13 1805  LATICACIDVEN 1.14   Cardiac Enzymes:  Recent Labs Lab 09/05/13 0959  TROPONINI <0.30    EKG: EKG Interpretation  Date/Time:    Ventricular Rate:    PR Interval:    QRS Duration:   QT Interval:    QTC Calculation:   R Axis:     Text Interpretation:    BNP: No results found for this basename: PROBNP,  in the last 168 hours  D-Dimer: No results found for this basename: DDIMER,  in the last 168 hours  Urinalysis:  Recent Labs Lab 09/04/13 2030  [place] 1.008  PHURINE 7.0  [place]  30*  UROBILINOGEN 2.0*  NITRITE POSITIVE*  LEUKOCYTESUR LARGE*    Micro Results: Recent Results (from the past 240 hour(s))  CULTURE, BLOOD (ROUTINE X 2)     Status: None   Collection Time    09/04/13  5:54 PM      Result Value Ref Range Status   Specimen Description BLOOD ARM LEFT   Final   Special Requests BOTTLES DRAWN AEROBIC AND ANAEROBIC B 6CC R 5CC   Final   Culture  Setup Time     Final   Value: 09/04/2013 21:46     Performed at [facility]   Culture     Final   Value: GRAM NEGATIVE RODS     Note: Gram Stain Report Called to,Read Back By and Verified With: Sheilah Mins 09/05/13 AT 0625 RIDK     Performed at [facility]   Report Status PENDING   Incomplete  CULTURE, BLOOD (ROUTINE X 2)     Status: None   Collection Time    09/04/13  6:15 PM      Result Value Ref Range Status   Specimen Description BLOOD RIGHT ARM   Final   Special Requests  BOTTLES DRAWN AEROBIC AND ANAEROBIC 5CC   Final   Culture  Setup Time     Final   Value: 09/05/2013 00:42     Performed at [facility]   Culture     Final   Value: GRAM NEGATIVE RODS     Note: Gram Stain Report Called to,Read Back By and Verified With: EMILY M@12 :35PM ON 09/05/13 BY DANTS     Performed at [facility]   Report Status PENDING   Incomplete    Blood Culture:    Component Value Date/Time   SDES BLOOD RIGHT ARM 09/04/2013 1815   SPECREQUEST BOTTLES DRAWN AEROBIC AND ANAEROBIC 5CC 09/04/2013 1815   CULT  Value: GRAM NEGATIVE RODS Note: Gram Stain Report Called to,Read Back By and Verified With: EMILY M@12 :35PM ON 09/05/13 BY DANTS Performed at [facility] 09/04/2013 1815   REPTSTATUS PENDING 09/04/2013 1815    Studies/Results: Dg Chest Port 1 View  09/04/2013   CLINICAL DATA:  Weakness and history of CHF  EXAM: PORTABLE CHEST - 1 VIEW  COMPARISON:  05/14/2012.  FINDINGS: The heart is enlarged but stable. There is tortuosity and calcification of the thoracic aorta. There is central vascular congestion and mild interstitial pulmonary edema consistent with CHF. No pleural effusions. The pacer wires stable. The bony thorax is intact.  IMPRESSION: CHF.   Electronically Signed   By: Kalman Jewels M.D.   On: 09/04/2013 18:18    Medications:  Scheduled Meds: . aspirin  81 mg Oral Daily  . cefTRIAXone (ROCEPHIN)  IV  1 g Intravenous Q24H  . sodium chloride  3 mL Intravenous Q12H   Continuous Infusions:   PRN Meds: acetaminophen  Antibiotics: Antibiotics Given (last 72 hours)   None      Day of Hospitalization: 1   Consults:    Assessment/Plan:   Principal Problem:   Sepsis due to urinary tract infection Active Problems:   CHF (congestive heart failure)   Hypertension   Atrial fibrillation with RVR  UTI, complicated by gram negative bacteremia Pt admitted w/ dysuria, increased frequency, urgency, fever,chills, malaise for 5-7 days. Pt  w/ h/o streptococcal UTI.  BCx 2/2 shows gnr.   -Cefriaxone IV  -UCx pending -I/O's  -APAP PRN fever  Chronic A-fib Follows w/ EP, Dr.  Lovena Le has PPM for sick sinus syndrome as well.  Not on anti-coag d/t h/o falls and anemia.  On ASA 81mg .  -telemetry -continue ASA 81mg  qd  Diastolic dysfunction, grade II TTE 05/14/13: EF of 50-55%, LA dilation, PAP 57 mm Hg.  -hold lasix, metoprolol, lsinopril for now. Restart in AM if BP can tolerate.   HTN Stable.  -would hold home meds   Chronic urinary incontinence On Vesicare 10 mg po qd at home.  -would d/c vesicare  Controlled DM type II Diet controlled.   VTE PPx  SCDs  Disposition Disposition is deferred, awaiting improvement of current medical problems.  Anticipated discharge in approximately 1-2 day(s).      LOS: 1 day   Jones Bales, MD PGY-1, Internal Medicine Teaching Service [phone] (7AM-5PM Mon-Fri) 09/05/2013, 3:22 PM

## 2013-09-05 NOTE — Progress Notes (Signed)
Pt arrived to floor in NAD, VSS. Pt oriented to room and floor and placed on cardiac monitor.

## 2013-09-05 NOTE — ED Notes (Signed)
Attempted report 

## 2013-09-05 NOTE — H&P (Signed)
INTERNAL MEDICINE TEACHING ATTENDING NOTE  Day 1 of stay  Patient name: [name]  MRN: [id number] Date of birth: 1926/01/12   80 y.o.with sepsis due to UTI. I have read Dr Ronnald Ramp' HP and agree with the exam, findings and assessment. I have personally evaluated the patient. She appears to be clinically improving. Patient reports no dysuria, only frequency. Exam reveals minimal lower abdominal tenderness. No tachycardia. No flank pain, pedal edema.   I have seen and evaluated this patient and discussed it with my IM resident team.  Please see the rest of the plan per resident note from today.   Rayna Brenner 09/05/2013, 2:41 PM.

## 2013-09-05 NOTE — Progress Notes (Signed)
Patient alert and oriented x4 throughout shift, vital signs stable.  Patient afebrile throughout day.  Family at bedside throughout shift; patient and family deny any questions or concerns at this time.  Will continue to monitor.

## 2013-09-05 NOTE — Progress Notes (Signed)
UR completed. Patient changed to inpatient- requiring IV antibiotics.  

## 2013-09-05 NOTE — Progress Notes (Signed)
CRITICAL VALUE ALERT  Critical value received:  Blood cultures positive anaerobic bottle, positive for gram negative rods  Date of notification:  09/05/2013   Time of notification:  06:25  Critical value read back:yes  Nurse who received alert:  Ronnette Hila, RN   MD notified (1st page):  IMTS on call  Time of first page:  06:26  MD notified (2nd page):  Time of second page:  Responding MD:  Dr. Gentry Fitz on call  Time MD responded:  [phone]

## 2013-09-05 NOTE — Progress Notes (Signed)
Orthostatic vital signs negative.

## 2013-09-05 NOTE — Progress Notes (Signed)
Patient evaluated for community based chronic disease management services with [facility] as a benefit of patient's [employer]. Spoke with patient and her two daughters at bedside to explain [facility] services.  She is engaged by [employer] and receives calls from a telephonic nurse case Freight forwarder.  She feels that this is adequate at this time and therefore declined [facility] services.  Left contact information and [facility] literature at bedside. Of note, [facility] Care Management services does not replace or interfere with any services that are arranged by inpatient case management or social work.  For additional questions or referrals please contact Corliss Blacker BSN RN [facility]iaison at [phone].

## 2013-09-05 NOTE — ED Provider Notes (Signed)
I saw and evaluated the patient, reviewed the resident's note and I agree with the findings and plan.  I reviewed and interpreted the EKG during the patient's evaluation in the ED and agree with the resident's interpretation.  Pt with fever and tachycardia in the ED.  Empiric abx started.  Remained stable in the ED.  Will admit for monitoring.  Kathalene Frames, MD 09/05/13 [phone]

## 2013-09-06 DIAGNOSIS — R7881 Bacteremia: Secondary | ICD-10-CM | POA: Diagnosis present

## 2013-09-06 LAB — BASIC METABOLIC PANEL
BUN: 9 mg/dL (ref 6–23)
CHLORIDE: 103 meq/L (ref 96–112)
CO2: 26 meq/L (ref 19–32)
CREATININE: 0.94 mg/dL (ref 0.50–1.10)
Calcium: 8.5 mg/dL (ref 8.4–10.5)
GFR calc Af Amer: 62 mL/min — ABNORMAL LOW (ref >90)
GFR calc non Af Amer: 54 mL/min — ABNORMAL LOW (ref >90)
Glucose, Bld: 97 mg/dL (ref 70–99)
Potassium: 3.5 mEq/L — ABNORMAL LOW (ref 3.7–5.3)
Sodium: 139 mEq/L (ref 137–147)

## 2013-09-06 LAB — CBC
HEMATOCRIT: 30.2 % — AB (ref 36.0–46.0)
HEMOGLOBIN: 10.1 g/dL — AB (ref 12.0–15.0)
MCH: 32.3 pg (ref 26.0–34.0)
MCHC: 33.4 g/dL (ref 30.0–36.0)
MCV: 96.5 fL (ref 78.0–100.0)
Platelets: 100 10*3/uL — ABNORMAL LOW (ref 150–400)
RBC: 3.13 MIL/uL — ABNORMAL LOW (ref 3.87–5.11)
RDW: 14.4 % (ref 11.5–15.5)
WBC: 4.9 10*3/uL (ref 4.0–10.5)

## 2013-09-06 LAB — URINE CULTURE: Colony Count: 30000

## 2013-09-06 MED ORDER — POTASSIUM CHLORIDE CRYS ER 20 MEQ PO TBCR
40.0000 meq | EXTENDED_RELEASE_TABLET | Freq: Once | ORAL | Status: AC
  Administered 2013-09-06: 40 meq via ORAL
  Filled 2013-09-06: qty 2

## 2013-09-06 NOTE — Progress Notes (Signed)
Subjective:   Pt admitted with UTI. Is sitting up eating breakfast this AM without complaints.     Objective:   Vital signs in last 24 hours: Filed Vitals:   09/06/13 0644 09/06/13 1034 09/06/13 1347 09/06/13 2050  BP: 121/68 131/63 104/46 133/64  Pulse: 90 78 82 83  Temp: 98.3 F (36.8 C)  98.4 F (36.9 C) 97.8 F (36.6 C)  TempSrc: Oral  Oral Oral  Resp: 16  18 18   Height:      Weight: 124 lb 1.6 oz (56.291 kg)     SpO2: 96%  100% 100%    Weight: Filed Weights   09/04/13 1733 09/05/13 0053 09/06/13 0644  Weight: 118 lb (53.524 kg) 125 lb 10.6 oz (57 kg) 124 lb 1.6 oz (56.291 kg)    Ins/Outs:  Intake/Output Summary (Last 24 hours) at 09/06/13 2314 Last data filed at 09/06/13 1845  Gross per 24 hour  Intake    770 ml  Output    300 ml  Net    470 ml    Physical Exam: Constitutional: Vital signs reviewed.  Elderly, frail appearing female sitting up in bed in no acute distress and cooperative with exam.   HEENT: [place]/AT; PERRL, EOMI, conjunctivae normal/pale, no scleral icterus  Cardiovascular: irregular rhythm, no MRG Pulmonary/Chest: normal respiratory effort, no accessory muscle use, mild bibasilar crackles Abdominal: Soft. +BS, NT/ND Neurological: A&O x3, CN II-XII grossly intact; non-focal exam Extremities: 2+DP b/l, no C/C/E  Skin: Warm, dry and intact. No rash  Lab Results:  BMP:  Recent Labs Lab 09/05/13 0539 09/06/13 0701  NA 138 139  K 4.3 3.5*  CL 103 103  CO2 16* 26  GLUCOSE 122* 97  BUN 12 9  CREATININE 0.94 0.94  CALCIUM 9.0 8.5   Anion Gap:  10  CBC:  Recent Labs Lab 09/05/13 0539 09/06/13 0701  WBC 6.3 4.9  HGB 10.8* 10.1*  HCT 32.5* 30.2*  MCV 97.0 96.5  PLT 99* 100*    Coagulation: No results found for this basename: LABPROT, INR,  in the last 168 hours  CBG:           No results found for this basename: GLUCAP,  in the last 168 hours         LFTs: No results found for this basename: AST, ALT, ALKPHOS, BILITOT,  PROT, ALBUMIN,  in the last 168 hours  Pancreatic Enzymes: No results found for this basename: LIPASE, AMYLASE,  in the last 168 hours  Lactic Acid/Procalcitonin:  Recent Labs Lab 09/04/13 1805  LATICACIDVEN 1.14   Cardiac Enzymes:  Recent Labs Lab 09/05/13 0959  TROPONINI <0.30    EKG: EKG Interpretation  Date/Time:  Wednesday Sep 04 2013 17:32:31 EDT Ventricular Rate:  124 PR Interval:    QRS Duration: 84 QT Interval:  298 QTC Calculation: 428 R Axis:   -13 Text Interpretation:  Atrial fibrillation with rapid ventricular response with premature ventricular or aberrantly conducted complexes Marked ST abnormality, possible inferior subendocardial injury Abnormal ECG ED PHYSICIAN INTERPRETATION AVAILABLE IN [facility] Confirmed by TEST, Record ([id number]) on 09/06/2013 7:19:53 AM  BNP: No results found for this basename: PROBNP,  in the last 168 hours  D-Dimer: No results found for this basename: DDIMER,  in the last 168 hours  Urinalysis:  Recent Labs Lab 09/04/13 2030  [place] 1.008  PHURINE 7.0  GLUCOSEU NEGATIVE  HGBUR NEGATIVE  BILIRUBINUR NEGATIVE  KETONESUR NEGATIVE  PROTEINUR 30*  UROBILINOGEN 2.0*  NITRITE POSITIVE*  LEUKOCYTESUR LARGE*    Micro Results: Recent Results (from the past 240 hour(s))  CULTURE, BLOOD (ROUTINE X 2)     Status: None   Collection Time    09/04/13  5:54 PM      Result Value Ref Range Status   Specimen Description BLOOD ARM LEFT   Final   Special Requests BOTTLES DRAWN AEROBIC AND ANAEROBIC B 6CC R 5CC   Final   Culture  Setup Time     Final   Value: 09/04/2013 21:46     Performed at [facility]   Culture     Final   Value: ESCHERICHIA COLI     Note: Gram Stain Report Called to,Read Back By and Verified With: MEREDITH ROSENBERGER 09/05/13 AT 0625 RIDK     Performed at [facility]   Report Status PENDING   Incomplete  CULTURE, BLOOD (ROUTINE X 2)     Status: None   Collection  Time    09/04/13  6:15 PM      Result Value Ref Range Status   Specimen Description BLOOD RIGHT ARM   Final   Special Requests BOTTLES DRAWN AEROBIC AND ANAEROBIC 5CC   Final   Culture  Setup Time     Final   Value: 09/05/2013 00:42     Performed at [facility]   Culture     Final   Value: ESCHERICHIA COLI     GRAM POSITIVE COCCI IN CLUSTERS     22 Note: Gram Stain Report Called to,Read Back By and Verified With: EMILY M@12 :35PM ON 09/05/13 BY DANTS Gram Stain Report Called to,Read Back By and Verified With: Sheilah Mins RN 5 15 1210 EDMOJ     Performed at [facility]   Report Status PENDING   Incomplete  URINE CULTURE     Status: None   Collection Time    09/04/13  8:30 PM      Result Value Ref Range Status   Specimen Description URINE, CATHETERIZED   Final   Special Requests NONE   Final   Culture  Setup Time     Final   Value: 09/04/2013 20:58     Performed at [facility]     Final   Value: 30,000 COLONIES/ML     Performed at [facility]   Culture     Final   Value: Multiple bacterial morphotypes present, none predominant. Suggest appropriate recollection if clinically indicated.     Performed at [facility]   Report Status 09/06/2013 FINAL   Final    Blood Culture:    Component Value Date/Time   SDES URINE, CATHETERIZED 09/04/2013 2030   [place] 09/04/2013 2030   CULT  Value: Multiple bacterial morphotypes present, none predominant. Suggest appropriate recollection if clinically indicated. Performed at [facility] 09/04/2013 2030   REPTSTATUS 09/06/2013 FINAL 09/04/2013 2030    Studies/Results: No results found.  Medications:  Scheduled Meds: . aspirin  81 mg Oral Daily  . cefTRIAXone (ROCEPHIN)  IV  1 g Intravenous Q24H  . metoprolol tartrate  100 mg Oral BID  . sodium chloride  3 mL Intravenous Q12H   Continuous Infusions:   PRN Meds: acetaminophen  Antibiotics: Antibiotics  Given (last 72 hours)   Date/Time Action Medication Dose Rate   09/05/13 1908 Given   cefTRIAXone (ROCEPHIN) 1 g in dextrose 5 % 50 mL IVPB 1 g 100 mL/hr   09/06/13  1722 Given   cefTRIAXone (ROCEPHIN) 1 g in dextrose 5 % 50 mL IVPB 1 g 100 mL/hr      Day of Hospitalization: 2   Consults:    Assessment/Plan:   Principal Problem:   Sepsis due to urinary tract infection Active Problems:   CHF (congestive heart failure)   Hypertension   Atrial fibrillation with RVR   Bacteremia due to Escherichia coli  UTI, complicated by gram negative bacteremia Pt admitted w/ dysuria, increased frequency, urgency, fever,chills, malaise for 5-7 days. Pt w/ h/o streptococcal UTI.  Afebrile without leukocytosis.  BCx 2/2 shows gnr (2/2 E.coli); 1/2 gpc in clusters (likely contaminant).   -consulted ID, appreciate recs -continue cefriaxone IV for 5 days, then transition to po abx -I/O's  -APAP PRN fever  Chronic A-fib Follows w/ EP, Dr. Lovena Le has PPM for sick sinus syndrome as well.  Not on anti-coag d/t h/o falls and anemia.  On ASA 81mg .  -telemetry -continue ASA 81mg  qd -continue metoprolol   Diastolic dysfunction, grade II TTE 05/14/13: EF of 50-55%, LA dilation, PAP 57 mm Hg.  -hold lasix, llsinopril for now. Restart in AM if BP can tolerate.  -metoprolol  HTN Stable.  -would hold home meds   Chronic urinary incontinence On Vesicare 10 mg po qd at home.  -would d/c vesicare  Controlled DM type II Diet controlled.   VTE PPx  SCDs  Disposition Disposition is deferred, awaiting improvement of current medical problems.  Anticipated discharge in approximately 1-2 day(s).      LOS: 2 days   Jones Bales, MD PGY-1, Internal Medicine Teaching Service [phone] (7AM-5PM Mon-Fri) 09/06/2013, 11:14 PM

## 2013-09-06 NOTE — Progress Notes (Signed)
CRITICAL VALUE ALERT  Critical value received:  Blood cultures aerobic bottle positive for gram positive cocci clusters  Date of notification:  09/06/2013   Time of notification:  0005  Critical value read back:yes  Nurse who received alert:  Ronnette Hila, RN   MD notified (1st page):  IMTS on call  Time of first page:  0008  MD notified (2nd page): IMTS on call  Time of second page:0054  Responding MD:  Dr. Eulas Post  Time MD responded:  [phone]

## 2013-09-06 NOTE — Clinical Documentation Improvement (Signed)
Please specify  CHF documentation as acute  , Holly or acute on Holly  [name]  Other Condition________________________________________ Cannot Clinically Determine  Supporting Information: PMH: . CHF (congestive heart Duran) diastolic dysfunction (home med-Lasix 20 mg BID- on hold during admission d/t BP)  MD  H&P note Extremities: Trace pitting edema dCHF- Patient w/ most recent ECHO from 05/14/13 shows EF of 50-55%, moderate left atrial dilation, PA pressure of 57 mm Hg. Takes Lasix 20 mg po bid + Lisinopril 40 mg po qd + Lopressor 100 mg po bid. No signs or symptoms of CHF exacerbation at this time.  -Hold Lasix, Metoprolol, Lisinopril for now. Restart in AM if BP can tolerate Pulmonary/Chest:   mild bibasilar crackles   Risk Factors:HTN, Restrictive lung disease, A Fib  Diagnostics:09/04/2013 CLINICAL DATA: Weakness and history of CHF EXAM: PORTABLE CHEST - 1 VIEW COMPARISON: 05/14/2012. FINDINGS: The heart is enlarged but stable. There is tortuosity and calcification of the thoracic aorta. There is central vascular congestion and mild interstitial pulmonary edema consistent with CHF. No pleural effusions. The pacer wires stable. The bony thorax is intact. IMPRESSION: CHF.   Treatment: Monitor strict I & O. VS,   Thank You, Philippa Chester ,RN Clinical Documentation Specialist:  [facility]

## 2013-09-06 NOTE — Progress Notes (Signed)
    Day 2 of stay      Patient name: [name]  Medical record number: [id number]  Date of birth: 30-Apr-1925  Patient seen and evaluated on rounds with resident team this morning. Appears to be clinically improving.   Filed Vitals:   09/05/13 2025 09/05/13 2033 09/06/13 0644 09/06/13 1034  BP: 122/66 139/93 121/68 131/63  Pulse: 94 91 90 78  Temp:  98.2 F (36.8 C) 98.3 F (36.8 C)   TempSrc:  Oral Oral   Resp:  18 16   Height:      Weight:   124 lb 1.6 oz (56.291 kg)   SpO2:  100% 96%    Exam - Non tender abdomen. Systolic murmur.     Recent Labs Lab 09/04/13 1754 09/05/13 0539 09/06/13 0701  BUN 14 12 9   CREATININE 1.16* 0.94 0.94    Blood cultures - One bottle growing EColi and other growing E coli and gram positive cocci in clusters.  Urine culture - multiple bacteria. Likely contamination.   Assessment and Plan    Ecoli UTI and bacteremia - Given the clinical improvement with ceftriaxone, I will suspect that patient had E coli UTI and subsequent bacteremia. Gram positive cocci in one bottle seems coag negative staph. We would continue ceftriaxone. Since the patient has recovered so well, it might be possible to change IV to oral pill and continue that for 2 weeks in this patient. However, we need to wait for sensitivities for that. We will curbside ID for this question.   I have discussed the care of this patient with my IM team residents. Please see the resident note for details.  River Ambrosio 09/06/2013, 1:20 PM.

## 2013-09-07 LAB — BASIC METABOLIC PANEL
BUN: 11 mg/dL (ref 6–23)
CO2: 26 mEq/L (ref 19–32)
CREATININE: 0.95 mg/dL (ref 0.50–1.10)
Calcium: 8.7 mg/dL (ref 8.4–10.5)
Chloride: 101 mEq/L (ref 96–112)
GFR calc Af Amer: 62 mL/min — ABNORMAL LOW (ref >90)
GFR, EST NON AFRICAN AMERICAN: 53 mL/min — AB (ref >90)
GLUCOSE: 109 mg/dL — AB (ref 70–99)
POTASSIUM: 4.3 meq/L (ref 3.7–5.3)
Sodium: 135 mEq/L — ABNORMAL LOW (ref 137–147)

## 2013-09-07 LAB — CBC WITH DIFFERENTIAL/PLATELET
BASOS ABS: 0 10*3/uL (ref 0.0–0.1)
BASOS PCT: 0 % (ref 0–1)
EOS ABS: 0.1 10*3/uL (ref 0.0–0.7)
Eosinophils Relative: 1 % (ref 0–5)
HCT: 26 % — ABNORMAL LOW (ref 36.0–46.0)
HEMOGLOBIN: 8.9 g/dL — AB (ref 12.0–15.0)
Lymphocytes Relative: 29 % (ref 12–46)
Lymphs Abs: 1.3 10*3/uL (ref 0.7–4.0)
MCH: 32.2 pg (ref 26.0–34.0)
MCHC: 34.2 g/dL (ref 30.0–36.0)
MCV: 94.2 fL (ref 78.0–100.0)
MONO ABS: 0.6 10*3/uL (ref 0.1–1.0)
Monocytes Relative: 14 % — ABNORMAL HIGH (ref 3–12)
Neutro Abs: 2.5 10*3/uL (ref 1.7–7.7)
Neutrophils Relative %: 55 % (ref 43–77)
Platelets: 95 10*3/uL — ABNORMAL LOW (ref 150–400)
RBC: 2.76 MIL/uL — ABNORMAL LOW (ref 3.87–5.11)
RDW: 14.3 % (ref 11.5–15.5)
WBC: 4.5 10*3/uL (ref 4.0–10.5)

## 2013-09-07 LAB — CULTURE, BLOOD (ROUTINE X 2)

## 2013-09-07 MED ORDER — FUROSEMIDE 20 MG PO TABS
20.0000 mg | ORAL_TABLET | Freq: Two times a day (BID) | ORAL | Status: DC
  Administered 2013-09-07 – 2013-09-10 (×8): 20 mg via ORAL
  Filled 2013-09-07 (×10): qty 1

## 2013-09-07 MED ORDER — DOCUSATE SODIUM 100 MG PO CAPS
100.0000 mg | ORAL_CAPSULE | Freq: Every day | ORAL | Status: DC | PRN
  Filled 2013-09-07: qty 1

## 2013-09-07 NOTE — Progress Notes (Signed)
Patient alert and oriented x4 throughout shift; vital signs stable.  Telemetry discontinued this afternoon per order.  Patient had two unmeasured voids in bathroom during shift.  Reminded patient to patient to call for assistance to use [facility] or bathroom so that voids can be measured, patient verbalized understanding.  Explained use of bed alarm to patient and utilized throughout shift.  Patient denies any questions or concerns at this time.  Will continue to monitor.

## 2013-09-07 NOTE — Discharge Summary (Signed)
Name: [name] MRN: [id number] DOB: 1925-06-08 80 y.o. PCP: Holly Falcon, MD ______________________________________________________  Date of Admission: 09/04/2013  5:50 PM Date of Discharge: 09/10/2013 Attending Physician: Holly Fireman, MD   Discharge Diagnosis: UTI complicated by gram negative bacteremia diastolic dysfunction, grade II Chronic atrial fibrillation DMII, controlled Mild thrombocytopenia Chronic urinary incontinence  Discharge Medications:   Medication List    STOP taking these medications       solifenacin 10 MG tablet  Commonly known as:  VESICARE      TAKE these medications       aspirin 81 MG tablet  Take 1 tablet (81 mg total) by mouth daily.     cefUROXime 500 MG tablet  Commonly known as:  CEFTIN  Take 1 tablet (500 mg total) by mouth 2 (two) times daily with a meal.     diclofenac sodium 1 % Gel  Commonly known as:  VOLTAREN  Apply 2 g topically 4 (four) times daily as needed (pain).     docusate sodium 100 MG capsule  Commonly known as:  COLACE  Take 1 capsule (100 mg total) by mouth daily as needed for constipation.     donepezil 5 MG tablet  Commonly known as:  ARICEPT  Take 1 tablet (5 mg total) by mouth at bedtime.     furosemide 20 MG tablet  Commonly known as:  LASIX  Take 1 tablet (20 mg total) by mouth 2 (two) times daily.     lisinopril 40 MG tablet  Commonly known as:  PRINIVIL,ZESTRIL  Take 1 tablet (40 mg total) by mouth daily.     metoprolol 100 MG tablet  Commonly known as:  LOPRESSOR  Take 1 tablet (100 mg total) by mouth 2 (two) times daily.        Disposition and follow-up:   [name] was discharged from [facility] in good condition to home.   Please address the following problems post discharge:  1.Resolution of urinary symptoms 2.Mild thrombocytopenia, repeat cbc   Labs / imaging needed at time of follow-up: cbc  Pending labs/ test needing follow-up:  None  Follow-up Appointments: Follow-up Information   Follow up with Holly Gallant, MD On 09/16/2013. (9:45AM)    Specialty:  Internal Medicine   Contact information:   [place] [address] [phone]       Schedule an appointment as soon as possible for a visit with [place]             .   Contact information:   [place] [phone]       Discharge Instructions: Discharge Instructions   Call MD for:  extreme fatigue    Complete by:  As directed      Call MD for:  persistant dizziness or light-headedness    Complete by:  As directed      Call MD for:  persistant nausea and vomiting    Complete by:  As directed      Call MD for:  temperature >100.4    Complete by:  As directed      Diet - low sodium heart healthy    Complete by:  As directed      Increase activity slowly    Complete by:  As directed           Consultations:    Procedures Performed:  Dg Chest Port 1  View  09/04/2013   CLINICAL DATA:  Weakness and history of CHF  EXAM: PORTABLE CHEST - 1 VIEW  COMPARISON:  05/14/2012.  FINDINGS: The heart is enlarged but stable. There is tortuosity and calcification of the thoracic aorta. There is central vascular congestion and mild interstitial pulmonary edema consistent with CHF. No pleural effusions. The pacer wires stable. The bony thorax is intact.  IMPRESSION: CHF.   Electronically Signed   By: [name] M.D.   On: 09/04/2013 18:18    2D Echo:  N/A  Cardiac Cath:  N/A  Admission HPI:  [name] is a 80 y.o. female w/ PMHx of HTN, A-fib (not on Coumadin, previously stopped 22/ sub-acute GIB), dCHF, sick sinus syndrome s/p PPM placement, h/o CVA 2002 (R MCA, cardioembolic), OA, and DM type II (not on any medications), presents to the ED w/ complaints of malaise, fatigue, increased urinary frequency and palpitations. The patient lives by herself and claims she started feeling  unwell about 5 days ago, starting w/ suprapubic pain, increased urinary frequency, urgency and subjective fever. Starting yesterday, she claims that she started having rigors at home, and most recently, felt that she was having palpitations, causing her to come to the ED. Ms. Pryer's daughters were also at bedside, saying they checked her BP at home which was wnl, however, her HR was 135-140. The patient otherwise denies chest pain, SOB, dizziness, nausea, and vomiting. She does however say she has had decreased appetite and po intake over the past few days.  On arrival to the ED, patient was noted to have tachycardia into the 120's, and fever of 103.2. Patient normotensive, given 500 cc NS bolus w/ improvement in tachycardia, fever controlled w/ tylenol. Started on Rocephin IV for UTI.   [place], [place] Hospital Course by problem list: Principal Problem:   Sepsis due to urinary tract infection Active Problems:   CHF (congestive heart failure)   Hypertension   Atrial fibrillation with RVR   Bacteremia due to Escherichia coli   Thrombocytopenia   UTI, complicated by gram negative bacteremia  She was admitted w/ dysuria, increased frequency, urgency, fever,chills, malaise for 5-7 days. Pt w/ h/o streptococcal UTI. Afebrile without leukocytosis. BCx 5/20 shows gnr (2/2 E.coli); 1/2 staph coagulase neg).  Consulted ID recommended ceftriaxone for 5 days (5/20-5/25) and transition to po ceftin to be discharged on this.     Chronic A-fib  Stable. HR controlled. Follows w/ EP, [name] has PPM for sick sinus syndrome as well. Not on anti-coag d/t h/o falls and anemia. On ASA 81mg .  Continued metoprolol 100mg  bid.    Diastolic dysfunction, grade II  TTE 05/14/13: EF of 50-55%, LA dilation, PAP 57 mm Hg. Pt euvolemic on exam.  Continued metoprolol and lasix.   HTN Stable.  Continued home meds.   Chronic urinary incontinence  On Vesicare 10 mg po qd at home.  Discontinued vesicare with history  of UTI and cognitive impairment.    h/o controlled DM type II  Diet controlled. Last HA1c 07/09/13: 6.2.   Mild thrombocytopenia Recheck at hospital follow-up.   Discharge Vitals:   BP 104/57  Pulse 47  Temp(Src) 98.1 F (36.7 C) (Oral)  Resp 16  Ht 5' (1.524 m)  Wt 117 lb 8 oz (53.298 kg)  BMI 22.95 kg/m2  SpO2 100%  Discharge Labs:  No results found for this or any previous visit (from the past 24 hour(s)).  Signed: Jones Bales, MD [phone]  09/15/2013, 9:06 PM   Time Spent on Discharge: 62minutes Services Ordered on Discharge: None Equipment Ordered on Discharge: None

## 2013-09-07 NOTE — Progress Notes (Signed)
Subjective:   Pt admitted with UTI. She is sitting up in bed in NAD.  She ate breakfast this AM.  She is without complaints except for small mild abdominal pain that she reports is chronic.  She has not had a BM today.  VSS.  HR is well-controlled-no CP, SOB, palpitations.  No dysuria.    Objective:   Vital signs in last 24 hours: Filed Vitals:   09/06/13 1347 09/06/13 2050 09/07/13 0500 09/07/13 0936  BP: 104/46 133/64 142/62 139/57  Pulse: 82 83 89 89  Temp: 98.4 F (36.9 C) 97.8 F (36.6 C) 98.4 F (36.9 C)   TempSrc: Oral Oral Oral   Resp: 18 18 18 18   Height:      Weight:   124 lb 11.2 oz (56.564 kg)   SpO2: 100% 100% 100% 100%    Weight: Filed Weights   09/05/13 0053 09/06/13 0644 09/07/13 0500  Weight: 125 lb 10.6 oz (57 kg) 124 lb 1.6 oz (56.291 kg) 124 lb 11.2 oz (56.564 kg)    Ins/Outs:  Intake/Output Summary (Last 24 hours) at 09/07/13 1217 Last data filed at 09/07/13 7989  Gross per 24 hour  Intake   1070 ml  Output    500 ml  Net    570 ml    Physical Exam: Constitutional: Vital signs reviewed.  Elderly, frail appearing female sitting up in bed in no acute distress and cooperative with exam.   HEENT: [place]/AT; PERRL, EOMI, conjunctivae normal/pale, no scleral icterus  Cardiovascular: irregular rhythm, no systolic murmur Pulmonary/Chest: normal respiratory effort, no accessory muscle use, CTAB Abdominal: Soft. +BS, NT/ND Neurological: A&O x3, CN II-XII grossly intact; non-focal exam Extremities: 2+DP b/l, no C/C/E  Skin: Warm, dry and intact. No rash  Lab Results:  BMP:  Recent Labs Lab 09/06/13 0701 09/07/13 0523  NA 139 135*  K 3.5* 4.3  CL 103 101  CO2 26 26  GLUCOSE 97 109*  BUN 9 11  CREATININE 0.94 0.95  CALCIUM 8.5 8.7   Anion Gap:  10  CBC:  Recent Labs Lab 09/06/13 0701 09/07/13 0523  WBC 4.9 4.5  NEUTROABS  --  2.5  HGB 10.1* 8.9*  HCT 30.2* 26.0*  MCV 96.5 94.2  PLT 100* 95*    Coagulation: No results found  for this basename: LABPROT, INR,  in the last 168 hours  CBG:           No results found for this basename: GLUCAP,  in the last 168 hours         LFTs: No results found for this basename: AST, ALT, ALKPHOS, BILITOT, PROT, ALBUMIN,  in the last 168 hours  Pancreatic Enzymes: No results found for this basename: LIPASE, AMYLASE,  in the last 168 hours  Lactic Acid/Procalcitonin:  Recent Labs Lab 09/04/13 1805  LATICACIDVEN 1.14   Cardiac Enzymes:  Recent Labs Lab 09/05/13 0959  TROPONINI <0.30    EKG: EKG Interpretation  Date/Time:  Wednesday Sep 04 2013 17:32:31 EDT Ventricular Rate:  124 PR Interval:    QRS Duration: 84 QT Interval:  298 QTC Calculation: 428 R Axis:   -13 Text Interpretation:  Atrial fibrillation with rapid ventricular response with premature ventricular or aberrantly conducted complexes Marked ST abnormality, possible inferior subendocardial injury Abnormal ECG ED PHYSICIAN INTERPRETATION AVAILABLE IN [facility] Confirmed by TEST, Record ([id number]) on 09/06/2013 7:19:53 AM  BNP: No results found for this basename: PROBNP,  in the last 168 hours  D-Dimer: No results  found for this basename: DDIMER,  in the last 168 hours  Urinalysis:  Recent Labs Lab 09/04/13 2030  [place] 1.008  PHURINE 7.0  [place] 30*  UROBILINOGEN 2.0*  NITRITE POSITIVE*  [place]*    Micro Results: Recent Results (from the past 240 hour(s))  CULTURE, BLOOD (ROUTINE X 2)     Status: None   Collection Time    09/04/13  5:54 PM      Result Value Ref Range Status   Specimen Description BLOOD ARM LEFT   Final   Special Requests BOTTLES DRAWN AEROBIC AND ANAEROBIC B 6CC R 5CC   Final   Culture  Setup Time     Final   Value: 09/04/2013 21:46     Performed at [facility]   Culture     Final   Value: ESCHERICHIA COLI     Note: Gram Stain Report  Called to,Read Back By and Verified With: MEREDITH ROSENBERGER 09/05/13 AT 0625 RIDK     Performed at [facility]   Report Status 09/07/2013 FINAL   Final   Organism ID, Bacteria ESCHERICHIA COLI   Final  CULTURE, BLOOD (ROUTINE X 2)     Status: None   Collection Time    09/04/13  6:15 PM      Result Value Ref Range Status   Specimen Description BLOOD RIGHT ARM   Final   Special Requests BOTTLES DRAWN AEROBIC AND ANAEROBIC 5CC   Final   Culture  Setup Time     Final   Value: 09/05/2013 00:42     Performed at [facility]   Culture     Final   Value: ESCHERICHIA COLI     Note: SUSCEPTIBILITIES PERFORMED ON PREVIOUS CULTURE WITHIN THE LAST 5 DAYS.     STAPHYLOCOCCUS SPECIES (COAGULASE NEGATIVE)     Note: THE SIGNIFICANCE OF ISOLATING THIS ORGANISM FROM A SINGLE SET OF BLOOD CULTURES WHEN MULTIPLE SETS ARE DRAWN IS UNCERTAIN. PLEASE NOTIFY THE MICROBIOLOGY DEPARTMENT WITHIN ONE WEEK IF SPECIATION AND SENSITIVITIES ARE REQUIRED.     22 Note: Gram Stain Report Called to,Read Back By and Verified With: EMILY M@12 :35PM ON 09/05/13 BY DANTS Gram Stain Report Called to,Read Back By and Verified With: Sheilah Mins RN 5 15 1210 EDMOJ   Report Status 09/07/2013 FINAL   Final  URINE CULTURE     Status: None   Collection Time    09/04/13  8:30 PM      Result Value Ref Range Status   Specimen Description URINE, CATHETERIZED   Final   Special Requests NONE   Final   Culture  Setup Time     Final   Value: 09/04/2013 20:58     Performed at [facility]     Final   Value: 30,000 COLONIES/ML     Performed at [facility]   Culture     Final   Value: Multiple bacterial morphotypes present, none predominant. Suggest appropriate recollection if clinically indicated.     Performed at [facility]   Report Status 09/06/2013 FINAL   Final    Blood Culture:    Component Value Date/Time   SDES URINE, CATHETERIZED 09/04/2013 2030   [place] 09/04/2013 2030   CULT  Value: Multiple bacterial morphotypes present, none predominant. Suggest appropriate recollection if clinically indicated. Performed at [facility] 09/04/2013 2030  REPTSTATUS 09/06/2013 FINAL 09/04/2013 2030    Studies/Results: No results found.  Medications:  Scheduled Meds: . aspirin  81 mg Oral Daily  . cefTRIAXone (ROCEPHIN)  IV  1 g Intravenous Q24H  . furosemide  20 mg Oral BID  . metoprolol tartrate  100 mg Oral BID  . sodium chloride  3 mL Intravenous Q12H   Continuous Infusions:   PRN Meds: acetaminophen, docusate sodium  Antibiotics: Antibiotics Given (last 72 hours)   Date/Time Action Medication Dose Rate   09/05/13 1908 Given   cefTRIAXone (ROCEPHIN) 1 g in dextrose 5 % 50 mL IVPB 1 g 100 mL/hr   09/06/13 1722 Given   cefTRIAXone (ROCEPHIN) 1 g in dextrose 5 % 50 mL IVPB 1 g 100 mL/hr      Day of Hospitalization: 3   Consults:    Assessment/Plan:   Principal Problem:   Sepsis due to urinary tract infection Active Problems:   CHF (congestive heart failure)   Hypertension   Atrial fibrillation with RVR   Bacteremia due to Escherichia coli  UTI, complicated by gram negative bacteremia Pt completely asymptomatic.  She was admitted w/ dysuria, increased frequency, urgency, fever,chills, malaise for 5-7 days. Pt w/ h/o streptococcal UTI.  Afebrile without leukocytosis.  BCx 2/2 shows gnr (2/2 E.coli); 1/2 gpc in clusters (likely contaminant).   -consulted ID, appreciate recs -continue cefriaxone IV for 5 days (day 3), then transition to po abx -I/O's  -APAP PRN fever  Chronic A-fib Stable.  HR controlled.  Follows w/ EP, Dr. Lovena Le has PPM for sick sinus syndrome as well.  Not on anti-coag d/t h/o falls and anemia.  On ASA 81mg .  -telemetry -continue ASA 81mg  qd -continue metoprolol  Diastolic dysfunction, grade II TTE 05/14/13: EF of 50-55%, LA dilation, PAP 57 mm Hg.  -would hold lasix, lisinopril for now. Pt is  euvolemic on exam.    -continue metoprolol -consider restarting lasix once daily instead of bid if needed   HTN Stable.  -would hold home meds   Chronic urinary incontinence On Vesicare 10 mg po qd at home.  -would d/c vesicare  Controlled DM type II Diet controlled.   VTE PPx  SCDs  Disposition Disposition is deferred, awaiting improvement of current medical problems.  Anticipated discharge in approximately 2-3 day(s) after completion of 5 days IV abx.     LOS: 3 days   Jones Bales, MD PGY-1, Internal Medicine Teaching Service [phone] (7AM-5PM Mon-Fri) 09/07/2013, 12:17 PM

## 2013-09-07 NOTE — Progress Notes (Signed)
Updated her daughter Adrian Blackwater today   Aundra Dubin MD

## 2013-09-08 DIAGNOSIS — D696 Thrombocytopenia, unspecified: Secondary | ICD-10-CM | POA: Diagnosis present

## 2013-09-08 LAB — CBC WITH DIFFERENTIAL/PLATELET
Basophils Absolute: 0 10*3/uL (ref 0.0–0.1)
Basophils Relative: 0 % (ref 0–1)
Eosinophils Absolute: 0.1 10*3/uL (ref 0.0–0.7)
Eosinophils Relative: 1 % (ref 0–5)
HEMATOCRIT: 29.1 % — AB (ref 36.0–46.0)
HEMOGLOBIN: 10.3 g/dL — AB (ref 12.0–15.0)
LYMPHS PCT: 29 % (ref 12–46)
Lymphs Abs: 1.4 10*3/uL (ref 0.7–4.0)
MCH: 33.2 pg (ref 26.0–34.0)
MCHC: 35.4 g/dL (ref 30.0–36.0)
MCV: 93.9 fL (ref 78.0–100.0)
MONO ABS: 0.9 10*3/uL (ref 0.1–1.0)
MONOS PCT: 19 % — AB (ref 3–12)
Neutro Abs: 2.4 10*3/uL (ref 1.7–7.7)
Neutrophils Relative %: 51 % (ref 43–77)
Platelets: 137 10*3/uL — ABNORMAL LOW (ref 150–400)
RBC: 3.1 MIL/uL — AB (ref 3.87–5.11)
RDW: 14.2 % (ref 11.5–15.5)
WBC: 4.8 10*3/uL (ref 4.0–10.5)

## 2013-09-08 LAB — BASIC METABOLIC PANEL
BUN: 10 mg/dL (ref 6–23)
CO2: 28 meq/L (ref 19–32)
Calcium: 9.2 mg/dL (ref 8.4–10.5)
Chloride: 100 mEq/L (ref 96–112)
Creatinine, Ser: 0.97 mg/dL (ref 0.50–1.10)
GFR calc Af Amer: 60 mL/min — ABNORMAL LOW (ref >90)
GFR calc non Af Amer: 52 mL/min — ABNORMAL LOW (ref >90)
Glucose, Bld: 115 mg/dL — ABNORMAL HIGH (ref 70–99)
Potassium: 3.9 mEq/L (ref 3.7–5.3)
SODIUM: 138 meq/L (ref 137–147)

## 2013-09-08 NOTE — Progress Notes (Addendum)
Subjective: Patient feeling well. No complaints   Objective: Vital signs in last 24 hours: Filed Vitals:   09/08/13 0529 09/08/13 0613 09/08/13 0948 09/08/13 1344  BP:  133/85 132/70 128/61  Pulse:  83 86 66  Temp:  98.2 F (36.8 C)  98.4 F (36.9 C)  TempSrc:  Oral  Oral  Resp:  18 18 18   Height:      Weight: 120 lb 1.6 oz (54.477 kg)     SpO2:  100% 100% 100%   Weight change: -4 lb 9.6 oz (-2.087 kg)  Intake/Output Summary (Last 24 hours) at 09/08/13 1434 Last data filed at 09/08/13 1343  Gross per 24 hour  Intake   1023 ml  Output   2625 ml  Net  -1602 ml   Vitals reviewed. General: resting in bed, NAD HEENT: [place]/at, no scleral icterus Cardiac: AF rate controlled, no rubs, murmurs or gallops Pulm: clear to auscultation bilaterally, no wheezes, rales, or rhonchi Abd: soft, nontender, nondistended, BS present Ext: warm and well perfused, no pedal edema Neuro: alert and oriented X3, cranial nerves II-XII grossly intact, moving all 4 extremities    Lab Results: Basic Metabolic Panel:  Recent Labs Lab 09/07/13 0523 09/08/13 0417  NA 135* 138  K 4.3 3.9  CL 101 100  CO2 26 28  GLUCOSE 109* 115*  BUN 11 10  CREATININE 0.95 0.97  CALCIUM 8.7 9.2   CBC:  Recent Labs Lab 09/07/13 0523 09/08/13 0417  WBC 4.5 4.8  NEUTROABS 2.5 2.4  HGB 8.9* 10.3*  HCT 26.0* 29.1*  MCV 94.2 93.9  PLT 95* 137*   Cardiac Enzymes:  Recent Labs Lab 09/05/13 0959  TROPONINI <0.30   Urinalysis:  Recent Labs Lab 09/04/13 2030  COLORURINE YELLOW  LABSPEC 1.008  PHURINE 7.0  GLUCOSEU NEGATIVE  HGBUR NEGATIVE  BILIRUBINUR NEGATIVE  KETONESUR NEGATIVE  PROTEINUR 30*  UROBILINOGEN 2.0*  NITRITE POSITIVE*  LEUKOCYTESUR LARGE*   Misc. Labs: Repeat blood cultures   Micro Results: Recent Results (from the past 240 hour(s))  CULTURE, BLOOD (ROUTINE X 2)     Status: None   Collection Time    09/04/13  5:54 PM      Result Value Ref Range Status   Specimen  Description BLOOD ARM LEFT   Final   Special Requests BOTTLES DRAWN AEROBIC AND ANAEROBIC B 6CC R 5CC   Final   Culture  Setup Time     Final   Value: 09/04/2013 21:46     Performed at [facility]   Culture     Final   Value: ESCHERICHIA COLI     Note: Gram Stain Report Called to,Read Back By and Verified With: Sheilah Mins 09/05/13 AT 0625 RIDK     Performed at [facility]   Report Status 09/07/2013 FINAL   Final   Organism ID, Bacteria ESCHERICHIA COLI   Final  CULTURE, BLOOD (ROUTINE X 2)     Status: None   Collection Time    09/04/13  6:15 PM      Result Value Ref Range Status   Specimen Description BLOOD RIGHT ARM   Final   Special Requests BOTTLES DRAWN AEROBIC AND ANAEROBIC 5CC   Final   Culture  Setup Time     Final   Value: 09/05/2013 00:42     Performed at [facility]   Culture     Final   Value: ESCHERICHIA COLI     Note: SUSCEPTIBILITIES PERFORMED ON  PREVIOUS CULTURE WITHIN THE LAST 5 DAYS.     STAPHYLOCOCCUS SPECIES (COAGULASE NEGATIVE)     Note: THE SIGNIFICANCE OF ISOLATING THIS ORGANISM FROM A SINGLE SET OF BLOOD CULTURES WHEN MULTIPLE SETS ARE DRAWN IS UNCERTAIN. PLEASE NOTIFY THE MICROBIOLOGY DEPARTMENT WITHIN ONE WEEK IF SPECIATION AND SENSITIVITIES ARE REQUIRED.     22 Note: Gram Stain Report Called to,Read Back By and Verified With: EMILY M@12 :35PM ON 09/05/13 BY DANTS Gram Stain Report Called to,Read Back By and Verified With: Sheilah Mins RN 5 15 1210 EDMOJ   Report Status 09/07/2013 FINAL   Final  URINE CULTURE     Status: None   Collection Time    09/04/13  8:30 PM      Result Value Ref Range Status   Specimen Description URINE, CATHETERIZED   Final   Special Requests NONE   Final   Culture  Setup Time     Final   Value: 09/04/2013 20:58     Performed at [facility]     Final   Value: 30,000 COLONIES/ML     Performed at [facility]   Culture     Final   Value: Multiple bacterial  morphotypes present, none predominant. Suggest appropriate recollection if clinically indicated.     Performed at [facility]   Report Status 09/06/2013 FINAL   Final  CULTURE, BLOOD (ROUTINE X 2)     Status: None   Collection Time    09/07/13  2:01 PM      Result Value Ref Range Status   Specimen Description BLOOD LEFT FOREARM   Final   Special Requests BOTTLES DRAWN AEROBIC ONLY 3CC   Final   Culture  Setup Time     Final   Value: 09/07/2013 18:41     Performed at [facility]   Culture     Final   Value:        BLOOD CULTURE RECEIVED NO GROWTH TO DATE CULTURE WILL BE HELD FOR 5 DAYS BEFORE ISSUING A FINAL NEGATIVE REPORT     Performed at [facility]   Report Status PENDING   Incomplete   Studies/Results: No results found. Medications:  Scheduled Meds: . aspirin  81 mg Oral Daily  . cefTRIAXone (ROCEPHIN)  IV  1 g Intravenous Q24H  . furosemide  20 mg Oral BID  . metoprolol tartrate  100 mg Oral BID  . sodium chloride  3 mL Intravenous Q12H   Continuous Infusions:  PRN Meds:.acetaminophen, docusate sodium Assessment/Plan:  80 y.o PMH AF (not on Coumadin due to falls), restrictive lung disease, HTN, DM 2, osteoporosis, stroke, arthritis, DJD, constipation, diverticulosis.  She presented 5/20 with fatigue, malaise, increased urinary frequency thought to have sepsis 2/2 UTI with E.coli bacteremia   #Bacteremia due to Escherichia coli -Day 5 of Rocephin will continue for 2 more days  -pt can likely go home after Tuesday dose and will need 1 week of oral Abx  -pending results repeat Blood culture  -will need f/u with ID and [facility]  #Sepsis due to urinary tract infection -Continue Rocephin though urine grew out 30K unidentifiable colonies   #Systolic CHF (congestive heart failure), chronic  -04/2012 with EF 50-55% (so EF recovered from 40-45% in the past) PAP 57, trivial val regurg, LA mod dilated, RA mildly dilated  -strict i/o, daily weights  -Lasix 20  mg bid   #Hypertension -BP controlled   #Atrial fibrillation with RVR, chronic  -  Continue Aspirin 81 mg, Lopressor 100 mg bid   #history of falls -BL walks with cane. -pending PT/OT  #F/E/N -NSL -no labs in the am  -HH diet   #DVT px  -scds      Dispo: Disposition is deferred at this time, awaiting improvement of current medical problems.  Anticipated discharge in approximately 2 day(s).   The patient does have a current PCP Marrion Coy, MD) and does need an [facility] hospital follow-up appointment after discharge.  The patient does not have transportation limitations that hinder transportation to clinic appointments.  .Services Needed at time of discharge: Y = Yes, Blank = No PT: Pending   OT: Pending   RN:   Equipment:   Other:     LOS: 4 days   Cresenciano Genre, MD [phone] 09/08/2013, 2:34 PM

## 2013-09-09 NOTE — Progress Notes (Signed)
Subjective:   Pt admitted with UTI. She is sitting up in bed in NAD.  Has good appetite and ate breakfast this AM.  She is without complaints except for small mild abdominal pain that she reports is chronic.    VSS.  HR is well-controlled-no CP, SOB, palpitations.  No dysuria.    Objective:   Vital signs in last 24 hours: Filed Vitals:   09/08/13 2047 09/09/13 0644 09/09/13 0654 09/09/13 1022  BP: 124/60  115/71 135/70  Pulse: 76  68 69  Temp: 98.1 F (36.7 C)  98 F (36.7 C)   TempSrc: Oral  Oral   Resp: 16  18   Height:      Weight:  117 lb 12.8 oz (53.434 kg)    SpO2: 100%  97%     Weight: Filed Weights   09/07/13 0500 09/08/13 0529 09/09/13 0644  Weight: 124 lb 11.2 oz (56.564 kg) 120 lb 1.6 oz (54.477 kg) 117 lb 12.8 oz (53.434 kg)    Ins/Outs:  Intake/Output Summary (Last 24 hours) at 09/09/13 1323 Last data filed at 09/09/13 2353  Gross per 24 hour  Intake   1000 ml  Output    400 ml  Net    600 ml    Physical Exam: Constitutional: Vital signs reviewed.  Elderly, frail appearing female sitting up in bed in no acute distress and cooperative with exam.   HEENT: [place]/AT; PERRL, EOMI, conjunctivae normal, no scleral icterus  Cardiovascular: irregular rhythm, systolic murmur Pulmonary/Chest: normal respiratory effort, no accessory muscle use, CTAB Abdominal: Thin. Soft. +BS, NT/ND Neurological: A&O x3, CN II-XII grossly intact; non-focal exam Extremities: 2+DP b/l, no C/C/E  Skin: Warm, dry and intact. No rash  Lab Results:  BMP:  Recent Labs Lab 09/07/13 0523 09/08/13 0417  NA 135* 138  K 4.3 3.9  CL 101 100  CO2 26 28  GLUCOSE 109* 115*  BUN 11 10  CREATININE 0.95 0.97  CALCIUM 8.7 9.2   Anion Gap:  10  CBC:  Recent Labs Lab 09/07/13 0523 09/08/13 0417  WBC 4.5 4.8  NEUTROABS 2.5 2.4  HGB 8.9* 10.3*  HCT 26.0* 29.1*  MCV 94.2 93.9  PLT 95* 137*    Coagulation: No results found for this basename: LABPROT, INR,  in the last 168  hours  CBG:           No results found for this basename: GLUCAP,  in the last 168 hours         LFTs: No results found for this basename: AST, ALT, ALKPHOS, BILITOT, PROT, ALBUMIN,  in the last 168 hours  Pancreatic Enzymes: No results found for this basename: LIPASE, AMYLASE,  in the last 168 hours  Lactic Acid/Procalcitonin:  Recent Labs Lab 09/04/13 1805  LATICACIDVEN 1.14   Cardiac Enzymes:  Recent Labs Lab 09/05/13 0959  TROPONINI <0.30    EKG: EKG Interpretation  Date/Time:  Wednesday Sep 04 2013 17:32:31 EDT Ventricular Rate:  124 PR Interval:    QRS Duration: 84 QT Interval:  298 QTC Calculation: 428 R Axis:   -13 Text Interpretation:  Atrial fibrillation with rapid ventricular response with premature ventricular or aberrantly conducted complexes Marked ST abnormality, possible inferior subendocardial injury Abnormal ECG ED PHYSICIAN INTERPRETATION AVAILABLE IN [facility] Confirmed by TEST, Record ([id number]) on 09/06/2013 7:19:53 AM  BNP: No results found for this basename: PROBNP,  in the last 168 hours  D-Dimer: No results found for this basename: DDIMER,  in the  last 168 hours  Urinalysis:  Recent Labs Lab 09/04/13 2030  [place] 1.008  PHURINE 7.0  GLUCOSEU NEGATIVE  HGBUR NEGATIVE  BILIRUBINUR NEGATIVE  KETONESUR NEGATIVE  PROTEINUR 30*  UROBILINOGEN 2.0*  NITRITE POSITIVE*  LEUKOCYTESUR LARGE*    Micro Results: Recent Results (from the past 240 hour(s))  CULTURE, BLOOD (ROUTINE X 2)     Status: None   Collection Time    09/04/13  5:54 PM      Result Value Ref Range Status   Specimen Description BLOOD ARM LEFT   Final   Special Requests BOTTLES DRAWN AEROBIC AND ANAEROBIC B 6CC R 5CC   Final   Culture  Setup Time     Final   Value: 09/04/2013 21:46     Performed at [facility]   Culture     Final   Value: ESCHERICHIA COLI     Note: Gram Stain Report Called to,Read Back By and Verified With: MEREDITH  ROSENBERGER 09/05/13 AT 0625 RIDK     Performed at [facility]   Report Status 09/07/2013 FINAL   Final   Organism ID, Bacteria ESCHERICHIA COLI   Final  CULTURE, BLOOD (ROUTINE X 2)     Status: None   Collection Time    09/04/13  6:15 PM      Result Value Ref Range Status   Specimen Description BLOOD RIGHT ARM   Final   Special Requests BOTTLES DRAWN AEROBIC AND ANAEROBIC 5CC   Final   Culture  Setup Time     Final   Value: 09/05/2013 00:42     Performed at [facility]   Culture     Final   Value: ESCHERICHIA COLI     Note: SUSCEPTIBILITIES PERFORMED ON PREVIOUS CULTURE WITHIN THE LAST 5 DAYS.     STAPHYLOCOCCUS SPECIES (COAGULASE NEGATIVE)     Note: THE SIGNIFICANCE OF ISOLATING THIS ORGANISM FROM A SINGLE SET OF BLOOD CULTURES WHEN MULTIPLE SETS ARE DRAWN IS UNCERTAIN. PLEASE NOTIFY THE MICROBIOLOGY DEPARTMENT WITHIN ONE WEEK IF SPECIATION AND SENSITIVITIES ARE REQUIRED.     22 Note: Gram Stain Report Called to,Read Back By and Verified With: EMILY M@12 :35PM ON 09/05/13 BY DANTS Gram Stain Report Called to,Read Back By and Verified With: Sheilah Mins RN 5 15 1210 EDMOJ   Report Status 09/07/2013 FINAL   Final  URINE CULTURE     Status: None   Collection Time    09/04/13  8:30 PM      Result Value Ref Range Status   Specimen Description URINE, CATHETERIZED   Final   Special Requests NONE   Final   Culture  Setup Time     Final   Value: 09/04/2013 20:58     Performed at [place]     Final   Value: 30,000 COLONIES/ML     Performed at [facility]   Culture     Final   Value: Multiple bacterial morphotypes present, none predominant. Suggest appropriate recollection if clinically indicated.     Performed at [facility]   Report Status 09/06/2013 FINAL   Final  CULTURE, BLOOD (ROUTINE X 2)     Status: None   Collection Time    09/07/13  2:01 PM      Result Value Ref Range Status   Specimen Description BLOOD LEFT  FOREARM   Final   Special Requests BOTTLES DRAWN AEROBIC ONLY 3CC   Final  Culture  Setup Time     Final   Value: 09/07/2013 18:41     Performed at [facility]   Culture     Final   Value:        BLOOD CULTURE RECEIVED NO GROWTH TO DATE CULTURE WILL BE HELD FOR 5 DAYS BEFORE ISSUING A FINAL NEGATIVE REPORT     Performed at [facility]   Report Status PENDING   Incomplete    Blood Culture:    Component Value Date/Time   SDES BLOOD LEFT FOREARM 09/07/2013 1401   SPECREQUEST BOTTLES DRAWN AEROBIC ONLY 3CC 09/07/2013 1401   CULT  Value:        BLOOD CULTURE RECEIVED NO GROWTH TO DATE CULTURE WILL BE HELD FOR 5 DAYS BEFORE ISSUING A FINAL NEGATIVE REPORT Performed at [facility] 09/07/2013 1401   REPTSTATUS PENDING 09/07/2013 1401    Studies/Results: No results found.  Medications:  Scheduled Meds: . aspirin  81 mg Oral Daily  . cefTRIAXone (ROCEPHIN)  IV  1 g Intravenous Q24H  . furosemide  20 mg Oral BID  . metoprolol tartrate  100 mg Oral BID  . sodium chloride  3 mL Intravenous Q12H   Continuous Infusions:   PRN Meds: acetaminophen, docusate sodium  Antibiotics: Antibiotics Given (last 72 hours)   Date/Time Action Medication Dose Rate   09/06/13 1722 Given   cefTRIAXone (ROCEPHIN) 1 g in dextrose 5 % 50 mL IVPB 1 g 100 mL/hr   09/07/13 1843 Given   cefTRIAXone (ROCEPHIN) 1 g in dextrose 5 % 50 mL IVPB 1 g 100 mL/hr   09/08/13 1835 Given   cefTRIAXone (ROCEPHIN) 1 g in dextrose 5 % 50 mL IVPB 1 g 100 mL/hr      Day of Hospitalization: 5   Consults:    Assessment/Plan:   Principal Problem:   Sepsis due to urinary tract infection Active Problems:   CHF (congestive heart failure)   Hypertension   Atrial fibrillation with RVR   Bacteremia due to Escherichia coli   Thrombocytopenia  UTI, complicated by gram negative bacteremia Pt asymptomatic.  She was admitted w/ dysuria, increased frequency, urgency, fever,chills, malaise for 5-7  days. Pt w/ h/o streptococcal UTI.  Afebrile without leukocytosis.  BCx 5/20 shows gnr (2/2 E.coli); 1/2 staph coagulase neg).     -consulted ID, appreciate recs -5/23 Repeat BCx pending  -continue cefriaxone IV (5/20-5/25) for 5 days, transition to po abx tomorrow -I/O's  -APAP PRN fever  Chronic A-fib Stable.  HR controlled.  Follows w/ EP, Dr. Lovena Le has PPM for sick sinus syndrome as well.  Not on anti-coag d/t h/o falls and anemia.  On ASA 81mg .  -telemetry -continue ASA 81mg  qd -continue metoprolol 100mg  bid  Diastolic dysfunction, grade II TTE 05/14/13: EF of 50-55%, LA dilation, PAP 57 mm Hg.  Pt euvolemic on exam.    -continue metoprolol and lasix   HTN Stable.  -continue home meds  Chronic urinary incontinence On Vesicare 10 mg po qd at home.  -would d/c vesicare  h/o controlled DM type II Diet controlled.  Last HA1c 07/09/13: 6.2.    VTE PPx  SCDs  FEN -heart healthy diet  Disposition Anticipated discharge tomorrow.  Will have completed IV abx for 5 days per ID recs and transition to po tomorrow.      LOS: 5 days   Jones Bales, MD PGY-1, Internal Medicine Teaching Service [phone] (7AM-5PM Mon-Fri) 09/09/2013, 1:23 PM

## 2013-09-09 NOTE — Progress Notes (Signed)
Report given to receiving RN. Patient in bed sleeping. No signs or symptoms of distress noted.  

## 2013-09-09 NOTE — Evaluation (Signed)
Physical Therapy Evaluation Patient Details Name: [name] MRN: [id number] DOB: [birth year] Today's Date: 09/09/2013   History of Present Illness  Pt is an 80 y/o female admitted with sepsis due to UTI. PMH significant for A-fib, SSS s/p pacemaker 2014, and DM.   Clinical Impression  Pt admitted with the above. Pt currently with functional limitations due to the deficits listed below (see PT Problem List). At the time of PT eval pt states she is near her baseline and would like to return home alone. Daughters available to assist her at home if needed. Pt will benefit from skilled PT to increase their independence and safety with mobility to allow discharge to the venue listed below.    Follow Up Recommendations No PT follow up;Supervision for mobility/OOB    Equipment Recommendations  None recommended by PT    Recommendations for Other Services       Precautions / Restrictions Precautions Precautions: Fall Precaution Comments: Fall risk set as Low however score indicated Moderate Fall Risk Restrictions Weight Bearing Restrictions: No      Mobility  Bed Mobility               General bed mobility comments: Pt sitting on EOB when PT arrives.   Transfers Overall transfer level: Needs assistance Equipment used: None Transfers: Sit to/from Stand Sit to Stand: Supervision         General transfer comment: Pt demonstrated proper hand placement and safety awareness with sit<>stand. No physical assist required.   Ambulation/Gait Ambulation/Gait assistance: Min guard;Supervision Ambulation Distance (Feet): 125 Feet Assistive device: None Gait Pattern/deviations: Step-through pattern;Decreased stride length;Narrow base of support Gait velocity: Very, very slow Gait velocity interpretation: Below normal speed for age/gender General Gait Details: Pt ambulating very slowly and states this is how she ambulates all the time. Reaching for railings in halls occasionally  for support, however no physical assist required from therapist. Min guard progressing to supervision as gait training progressed.   Stairs            Wheelchair Mobility    Modified Rankin (Stroke Patients Only)       Balance Overall balance assessment: Needs assistance Sitting-balance support: Feet supported;No upper extremity supported Sitting balance-Leahy Scale: Good     Standing balance support: No upper extremity supported Standing balance-Leahy Scale: Fair                               Pertinent Vitals/Pain Pt reports no DOE or lightheadedness with mobility. Does report that she has some soreness in her abdomen throughout session, however if asked if pt has any "pain", denies.     Home Living Family/patient expects to be discharged to:: Private residence Living Arrangements: Alone Available Help at Discharge: Family;Available PRN/intermittently Type of Home: House Home Access: Stairs to enter   [employer] of Steps: 2 Home Layout: One level Home Equipment: Cane - single point      Prior Function Level of Independence: Independent with assistive device(s)         Comments: Occasionally used cane. Not driving, Daughters assist with grocery shopping. She goes with them to shop.     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  Lower Extremity Assessment: RLE deficits/detail RLE Deficits / Details: Pt with elastic knee brace donned on R side and states she has arthritis in the knee. Reports wearing the brace at all times.     Cervical / Trunk Assessment: Normal  Communication   Communication: No difficulties  Cognition Arousal/Alertness: Awake/alert Behavior During Therapy: WFL for tasks  assessed/performed Overall Cognitive Status: Within Functional Limits for tasks assessed                      General Comments      Exercises        Assessment/Plan    PT Assessment Patient needs continued PT services  PT Diagnosis Generalized weakness   PT Problem List Decreased strength;Decreased range of motion;Decreased activity tolerance;Decreased balance;Decreased mobility;Decreased knowledge of use of DME;Decreased safety awareness  PT Treatment Interventions DME instruction;Gait training;Stair training;Functional mobility training;Therapeutic activities;Therapeutic exercise;Neuromuscular re-education;Patient/family education   PT Goals (Current goals can be found in the Care Plan section) Acute Rehab PT Goals Patient Stated Goal: To return to her home PT Goal Formulation: With patient Time For Goal Achievement: 09/16/13 Potential to Achieve Goals: Good    Frequency Min 3X/week   Barriers to discharge        Co-evaluation               End of Session Equipment Utilized During Treatment: Gait belt Activity Tolerance: Patient tolerated treatment well Patient left: in chair;with call bell/phone within reach Nurse Communication: Mobility status         Time: 2130-8657 PT Time Calculation (min): 26 min   Charges:   PT Evaluation $Initial PT Evaluation Tier I: 1 Procedure PT Treatments $Therapeutic Activity: 8-22 mins   PT G CodesJolyn Lent 09/09/2013, 10:51 AM  Jolyn Lent, PT, DPT Acute Rehabilitation Services Pager: [phone]

## 2013-09-09 NOTE — Evaluation (Signed)
Occupational Therapy Evaluation Patient Details Name: [name] MRN: [id number] DOB: 07/25/1925 Today's Date: 09/09/2013    History of Present Illness Pt is an 80 y/o female admitted with sepsis due to UTI. PMH significant for A-fib, SSS s/p pacemaker 2014, and DM.    Clinical Impression   This 80 year old female was admitted for sepsis.  At baseline, she is mod I for adls/iadls. She is currently supervision, and she tends to furniture walk.  Will follow in acute with mod I goals.  Do not anticipate she will need further OT after this.      Follow Up Recommendations  No OT follow up    Equipment Recommendations  None recommended by OT    Recommendations for Other Services       Precautions / Restrictions Precautions Precautions: Fall Precaution Comments: Fall risk set as Low however score indicated Moderate Fall Risk Restrictions Weight Bearing Restrictions: No      Mobility Bed Mobility Overal bed mobility: Independent             General bed mobility comments: Pt sitting on EOB when PT arrives.   Transfers Overall transfer level: Needs assistance Equipment used: None Transfers: Sit to/from Stand Sit to Stand: Modified independent (Device/Increase time)         General transfer comment: Pt demonstrated proper hand placement and safety awareness with sit<>stand. No physical assist required.     Balance Overall balance assessment: Needs assistance Sitting-balance support: Feet supported;No upper extremity supported Sitting balance-Leahy Scale: Good     Standing balance support: No upper extremity supported Standing balance-Leahy Scale: Fair                              ADL Overall ADL's : Needs assistance/impaired                         Toilet Transfer: Supervision/safety;Ambulation;Regular Toilet;Grab bars   Toileting- Clothing Manipulation and Hygiene: Supervision/safety;Sit to/from stand   Tub/ Shower Transfer:  Supervision/safety;Ambulation;Grab bars;Walk-in shower   Functional mobility during ADLs: Supervision/safety General ADL Comments: Pt ambulated to bathroom to perform above transfers.  Had her don socks and walk to cabinet to retrieve low objects.  Pt tends to furniture walk.  She reports that she feels near baseline.  Pt is mod I at home with [facility] including iadls.  Daughter assists her with grocery shopping.  Educated pt to pace herself at home until she feels back to baseline.  She states she feels almost there.       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   Lower Extremity Assessment Lower Extremity Assessment: RLE deficits/detail RLE Deficits / Details: Pt with elastic knee brace donned on R side and states she has arthritis in the knee. Reports wearing the brace at all times.    Cervical / Trunk Assessment Cervical / Trunk Assessment: Normal   Communication Communication Communication: No difficulties   Cognition Arousal/Alertness: Awake/alert Behavior During Therapy: WFL for tasks assessed/performed Overall  Cognitive Status: Within Functional Limits for tasks assessed                     General Comments       Exercises       Shoulder Instructions      Home Living Family/patient expects to be discharged to:: Private residence Living Arrangements: Alone Available Help at Discharge: Family;Available PRN/intermittently Type of Home: House Home Access: Stairs to enter [employer] of Steps: 2   Home Layout: One level     Bathroom Shower/Tub: Occupational psychologist: Standard     Home Equipment: Cane -  single point;Grab bars - toilet;Grab bars - tub/shower          Prior Functioning/Environment Level of Independence: Independent with assistive device(s)        Comments: Occasionally used cane. Not driving, Daughters assist with grocery shopping. She goes with them to shop.    OT Diagnosis: Generalized weakness   OT Problem List: Decreased strength   OT Treatment/Interventions: Self-care/ADL training;DME and/or AE instruction;Patient/family education ;energy conservation   OT Goals(Current goals can be found in the care plan section) Acute Rehab OT Goals Patient Stated Goal: To return to her home OT Goal Formulation: With patient Time For Goal Achievement: 09/16/13 Potential to Achieve Goals: Good ADL Goals Pt Will Transfer to Toilet: with modified independence;regular height toilet;grab bars Pt Will Perform Tub/Shower Transfer: Shower transfer;grab bars;ambulating;with modified independence Additional ADL Goal #1: pt will gather clothes and complete adl at mod I level  OT Frequency: Min 2X/week   Barriers to D/C:            Co-evaluation              End of Session    Activity Tolerance: Patient tolerated treatment well Patient left: in bed;with call bell/phone within reach   Time: 0626-9485 OT Time Calculation (min): 13 min Charges:  OT General Charges $OT Visit: 1 Procedure OT Evaluation $Initial OT Evaluation Tier I: 1 Procedure OT Treatments $Self Care/Home Management : 8-22 mins G-Codes:    Lesle Chris 09/20/13, 1:37 PM   Lesle Chris, OTR/L [phone] 20-Sep-2013

## 2013-09-10 MED ORDER — CEFUROXIME AXETIL 500 MG PO TABS: 500.0000 mg | ORAL_TABLET | Freq: Two times a day (BID) | ORAL | Status: AC

## 2013-09-10 MED ORDER — CEFUROXIME AXETIL 500 MG PO TABS: 500.0000 mg | ORAL_TABLET | Freq: Two times a day (BID) | ORAL | Status: DC

## 2013-09-10 MED ORDER — DEXTROSE 5 % IV SOLN
1.0000 g | INTRAVENOUS | Status: DC
  Administered 2013-09-10: 1 g via INTRAVENOUS
  Filled 2013-09-10: qty 10

## 2013-09-10 MED ORDER — CEPHALEXIN 500 MG PO CAPS
500.0000 mg | ORAL_CAPSULE | Freq: Two times a day (BID) | ORAL | Status: DC
  Filled 2013-09-10: qty 1

## 2013-09-10 MED ORDER — CEFUROXIME AXETIL 500 MG PO TABS
500.0000 mg | ORAL_TABLET | Freq: Two times a day (BID) | ORAL | Status: DC
  Filled 2013-09-10 (×2): qty 1

## 2013-09-10 NOTE — Progress Notes (Addendum)
ANTIBIOTIC CONSULT NOTE - INITIAL  Pharmacy Consult for oral antibiotic for Ecoli bacteremia Indication: bacteremia/UTI  No Known Allergies  Patient Measurements: Height: 5' (152.4 cm) Weight: 117 lb 8 oz (53.298 kg) (c scale) IBW/kg (Calculated) : 45.5 Adjusted Body Weight: n/a  Vital Signs: Temp: 98.3 F (36.8 C) (05/26 1349) Temp src: Oral (05/26 1349) BP: 103/42 mmHg (05/26 1349) Pulse Rate: 66 (05/26 1349) Intake/Output from previous day: 05/25 0701 - 05/26 0700 In: 560 [P.O.:560] Out: 2575 [Urine:2575] Intake/Output from this shift: Total I/O In: 480 [P.O.:480] Out: 300 [Urine:300]  Labs:  Recent Labs  09/08/13 0417  WBC 4.8  HGB 10.3*  PLT 137*  CREATININE 0.97   Estimated Creatinine Clearance: 30.5 ml/min (by C-G formula based on Cr of 0.97). No results found for this basename: VANCOTROUGH, Corlis Leak, VANCORANDOM, [place], GENTPEAK, GENTRANDOM, TOBRATROUGH, TOBRAPEAK, TOBRARND, AMIKACINPEAK, AMIKACINTROU, AMIKACIN,  in the last 72 hours   Microbiology: Recent Results (from the past 720 hour(s))  CULTURE, BLOOD (ROUTINE X 2)     Status: None   Collection Time    09/04/13  5:54 PM      Result Value Ref Range Status   Specimen Description BLOOD ARM LEFT   Final   Special Requests BOTTLES DRAWN AEROBIC AND ANAEROBIC B 6CC R 5CC   Final   Culture  Setup Time     Final   Value: 09/04/2013 21:46     Performed at [facility]   Culture     Final   Value: ESCHERICHIA COLI     Note: Gram Stain Report Called to,Read Back By and Verified With: MEREDITH ROSENBERGER 09/05/13 AT 0625 RIDK     Performed at [facility]   Report Status 09/07/2013 FINAL   Final   Organism ID, Bacteria ESCHERICHIA COLI   Final  CULTURE, BLOOD (ROUTINE X 2)     Status: None   Collection Time    09/04/13  6:15 PM      Result Value Ref Range Status   Specimen Description BLOOD RIGHT ARM   Final   Special Requests BOTTLES DRAWN AEROBIC AND ANAEROBIC 5CC   Final    Culture  Setup Time     Final   Value: 09/05/2013 00:42     Performed at [facility]   Culture     Final   Value: ESCHERICHIA COLI     Note: SUSCEPTIBILITIES PERFORMED ON PREVIOUS CULTURE WITHIN THE LAST 5 DAYS.     STAPHYLOCOCCUS SPECIES (COAGULASE NEGATIVE)     Note: THE SIGNIFICANCE OF ISOLATING THIS ORGANISM FROM A SINGLE SET OF BLOOD CULTURES WHEN MULTIPLE SETS ARE DRAWN IS UNCERTAIN. PLEASE NOTIFY THE MICROBIOLOGY DEPARTMENT WITHIN ONE WEEK IF SPECIATION AND SENSITIVITIES ARE REQUIRED.     22 Note: Gram Stain Report Called to,Read Back By and Verified With: EMILY M@12 :35PM ON 09/05/13 BY DANTS Gram Stain Report Called to,Read Back By and Verified With: Sheilah Mins RN 5 15 1210 EDMOJ   Report Status 09/07/2013 FINAL   Final  URINE CULTURE     Status: None   Collection Time    09/04/13  8:30 PM      Result Value Ref Range Status   Specimen Description URINE, CATHETERIZED   Final   Special Requests NONE   Final   Culture  Setup Time     Final   Value: 09/04/2013 20:58     Performed at [facility]     Final   Value:  30,000 COLONIES/ML     Performed at [facility]     Final   Value: Multiple bacterial morphotypes present, none predominant. Suggest appropriate recollection if clinically indicated.     Performed at [facility]   Report Status 09/06/2013 FINAL   Final  CULTURE, BLOOD (ROUTINE X 2)     Status: None   Collection Time    09/07/13  2:01 PM      Result Value Ref Range Status   Specimen Description BLOOD LEFT FOREARM   Final   Special Requests BOTTLES DRAWN AEROBIC ONLY 3CC   Final   Culture  Setup Time     Final   Value: 09/07/2013 18:41     Performed at [facility]   Culture     Final   Value:        BLOOD CULTURE RECEIVED NO GROWTH TO DATE CULTURE WILL BE HELD FOR 5 DAYS BEFORE ISSUING A FINAL NEGATIVE REPORT     Performed at [facility]   Report Status PENDING   Incomplete     Medical History: Past Medical History  Diagnosis Date  . Weight loss, unintentional 2010-2011    8/08-8/10 138-146 lbs, between 8/10-10/11 lost 18 lbs., work-up negative  . Incontinence overflow, stress female     on vesicare  . History of sick sinus syndrome s/p pacemaker Dr. Lovena Le 2000  . Atrial fibrillation     on coumadin followed by Dr. Elie Confer  . History of vaginal bleeding 05/2005    nagative endometrial biopsy  . Restrictive lung disease 1996    PFT's showed mild disease  . Anemia     Iron deficiency, does not tolerate po  . CHF (congestive heart failure)     diastolic dysfunction  . Diabetes mellitus     type 2  diet controlled  . Hypertension     well controlled on 3 agents  . Osteoporosis   . Stroke 2002    R MCA, cardioembolic  . Allergic rhinitis   . Onychomycosis   . History of trichomonal urethritis   . Arthritis   . DJD (degenerative joint disease)   . Constipation   . Diverticulosis of colon (without mention of hemorrhage)     Medications:  Ceftriaxone 5/20>5/25  Assessment: 80 yo female with Ecoli bacteremia.  She has received 6 days of ceftriaxone.  Pharmacy asked to convert to po antibiotics in anticipation of discharge.  Est CrCl ~ 30 ml/min  Plan: 1. Ceftin 500 mg BID. Per MD, planning to treat for 10 days.  Uvaldo Rising, BCPS  Clinical Pharmacist Pager [phone]  09/10/2013 4:05 PM   Addendum: Spoke to Karlyn Agee, MD.  They would like for her to have 7 days of IV ceftriaxone before discharge.  Will give today's dose IV, then d/c patient on 7 days of oral ceftin.  Uvaldo Rising, BCPS  Clinical Pharmacist Pager [phone]  09/10/2013 5:46 PM

## 2013-09-10 NOTE — Care Management Note (Addendum)
  Page 1 of 1   09/11/2013     4:21:19 PM CARE MANAGEMENT NOTE 09/11/2013  Patient:  [name], [name]   Account Number:  [id number]  Date Initiated:  09/10/2013  Documentation initiated by:  Judie Hollick  Subjective/Objective Assessment:   Admitted with sepsis d/t UTI, Afib, CHF, Bacteremia d/t Ecoli     Action/Plan:   CM to follow for disposition needs   Anticipated DC Date:  09/11/2013   Anticipated DC Plan:  [place]  CM consult      Choice offered to / List presented to:             Status of service:  Completed, signed off Medicare Important Message given?  YES (If response is "NO", the following Medicare IM given date fields will be blank) Date Medicare IM given:  09/04/2013 Date Additional Medicare IM given:    Discharge Disposition:  HOME/SELF CARE  Per UR Regulation:  Reviewed for med. necessity/level of care/duration of stay  If discussed at [place] of Stay Meetings, dates discussed:    Comments:  Pualani Borah RN, BSN, MSHL, CCM  Nurse - Case Manager, ([facility] [place])  [phone]  09/10/2013 Social:  From home alone [place] Program:  active PT Recs:  no PT needs / supervise mobility Disposition Plan:  Home / Self care

## 2013-09-10 NOTE — Discharge Instructions (Signed)
Please keep your follow-up appointments; this is very important for your continued recovery.    We have sent your prescriptions to your pharmacy.  Please take your antibiotic (ceftin twice daily as directed until finished).    Please continue to take all of your medications as prescribed.  Do not miss any doses without contacting your primary physician.  If you have questions, please contact your physician or contact the Internal Medicine Teaching Service at [phone].  Please bring your medicications with you to your appointments; medications may be eye drops, herbals, vitamins, or pills.    If you believe you are having a life-threatening emergency, go to the nearest Emergency Department.      [employer] Guide  1) Find a Charity fundraiser Although you won't have to find out who is covered by [employer] plan, it is a good idea to ask around and get recommendations. You will then need to call the office and see if the doctor you have chosen will accept you as a new patient and what types of options they offer for patients who are self-pay. Some doctors offer discounts or will set up payment plans for their patients who do not have insurance, but you will need to ask so you aren't surprised when you get to your appointment.  2) Contact Your Local Health Department Not all health departments have doctors that can see patients for sick visits, but many do, so it is worth a call to see if yours does. If you don't know where your local health department is, you can check in your phone book. The CDC also has a tool to help you locate your state's health department, and many state websites also have listings of all of their local health departments.  3) Find a [facility] If your illness is not likely to be very severe or complicated, you may want to try a walk in clinic. These are popping up all over the country in pharmacies, drugstores, and shopping centers. They're  usually staffed by nurse practitioners or physician assistants that have been trained to treat common illnesses and complaints. They're usually fairly quick and inexpensive. However, if you have serious medical issues or chronic medical problems, these are probably not your best option.  No Primary Care Doctor: - Call Health Connect at  [phone] - they can help you locate a primary care doctor that  accepts your insurance, provides certain services, etc. - Physician Referral Service- [phone]  Chronic Pain Problems: Organization         Address  Phone   Notes  [facility]  [phone] Patients need to be referred by their primary care doctor.   Medication Assistance: Organization         Address  Phone   Notes  [facility] Medication [facility] [address] [place], [place] [address] [phone] --Must be a resident of [facility] -- Must have NO insurance coverage whatsoever (no Medicaid/ Medicare, etc.) -- The pt. MUST have a primary care doctor that directs their care regularly and follows them in the community   MedAssist  [phone]   [employer]  [phone]    Agencies that provide inexpensive medical care: Organization         Address  Phone   Notes  [place]  [phone]   [facility]    [phone]   [facility] [place], [place] [address] [phone]   [place] [address] [place] [phone]   Planned Parenthood    [phone]   [facility]    [phone]   [facility] [place] and [address], [place] Phone:  [phone], Fax:  [phone] Hours of Operation:  9 am - 6 pm, M-F.  Also accepts Medicaid/Medicare and self-pay.  [facility] for [place], [address], [place] Phone: [phone], Fax: [phone]. Hours  of Operation:  8:30 am - 5:30 pm, M-F.  Also accepts Medicaid and self-pay.  [facility] [address] [place] Phone: [phone]   [place], [place], [place] [phone], Ext. [phone] Mondays & Thursdays: 7-9 AM.  First 15 patients are seen on a first come, first serve basis.    [place] Providers:  Organization         Address  Phone   Notes  [facility] [address], [place] [phone] Also accepts self-pay patients.  [facility] [address], [place]  [phone]   [place], [address], [place] [phone]   [facility] [address] [place] [phone]   Lucianne Lei [address], [place]   [phone] Only accepts [place] Access [place] patients after they have their name applied to their card.   Self-Pay (no insurance) in [facility]:  Organization         Address  Phone   Notes  Sickle Cell Patients, [facility] Internal Medicine [place] [phone]   [facility] [place] [phone]   [facility] [place]  [place], [place], [place] [phone]   [facility]/Dr. Osei-Bonsu  [address] [place] or [address], [place] [phone] Phone number for both [place] and [place] locations is the same.  [facility] [address], [place] [phone]   [facility] [address] [place] or [address] [phone]   [facility] [address] [place] [phone], phone; [phone], fax Sees patients 1st and 3rd Saturday of every month.  Must not qualify for public or private insurance (i.e. Medicaid,  Medicare, [place] Health Choice, Veterans' Benefits)  Household income should be no more than 200% of the poverty level The clinic cannot treat you if you are pregnant or think you are pregnant  Sexually transmitted diseases are not treated at the clinic.    Dental Care: Organization         Address  Phone  Notes  [facility] Department of [facility] [place] [phone] Accepts children up to age 65 who are enrolled in [place] or [place]; pregnant women with a Medicaid card; and children who have applied for Medicaid or [place] Health Choice, but were declined, whose parents can pay a reduced fee at time of service.  [facility] Department of [facility]  [address], [place] [phone] Accepts children up to age 64 who are enrolled in  Medicaid or [place] Health Choice; pregnant women with a Medicaid card; and children who have applied for Medicaid or [place] Health Choice, but were declined, whose parents can pay a reduced fee at time of service.  [facility]  [address] [place], [place] [phone] Patients are seen by appointment only. Walk-ins are not accepted. [facility] will see patients 54 years of age and older. Monday - Tuesday (8am-5pm) Most Wednesdays (8:30-5pm) $30 per visit, cash only  [facility] Adult Dental Access PROGRAM  [address], [facility] [phone] Patients are seen by appointment only. Walk-ins are not accepted. [facility] will see patients 14 years of age and older. One Wednesday Evening (Monthly: Volunteer Based).  $30 per visit, cash only  [employer]  [phone] for adults; Children under age 20, call Graduate Pediatric Dentistry at [phone]. Children aged 51-14, please call [phone] to request a pediatric application.  Dental services are provided in all areas of dental care including fillings, crowns  and bridges, complete and partial dentures, implants, gum treatment, root canals, and extractions. Preventive care is also provided. Treatment is provided to both adults and children. Patients are selected via a lottery and there is often a waiting list.   [facility] [address] [place]  [phone] [email or web address]   [facility] [address] [place], [place] [phone], Ext. [phone] Second and Fourth Thursday of each month, opens at 6:30 AM; Clinic ends at 9 AM.  Patients are seen on a first-come first-served basis, and a limited number are seen during each clinic.   [facility]  [address] [place], [place] [phone]   Eligibility Requirements You must have lived in [place], [place], or [place] counties for at least the last three months.   You cannot be eligible for state or federal sponsored [place], including [employer], [place], or [employer].   You generally cannot be eligible for healthcare insurance through your employer.    How to apply: Eligibility screenings are held every Tuesday and Wednesday afternoon from 1:00 pm until 4:00 pm. You do not need an appointment for the interview!  [facility] [address] [place], [place] [phone]   [facility] Health Department  [phone]   [facility] Health Department  [phone]   [facility] Health Department  [phone]    Behavioral Health Resources in the Community: Intensive Outpatient Programs Organization         Address  Phone  Notes  [facility] [address], [place], [place] [phone]   [facility] Outpatient [address] [place], [place] [phone]   [facility]: [facility] Drug [facility] [address], [place], [place]  [phone]   [facility] Mental Health [address],  [place], [place] [phone] or [phone]   Substance Abuse  Resources Organization         Address  Phone  Notes  Alcohol and Drug Services  [phone]   [facility]  [phone]   [facility] [place]  [phone]   Floydene Flock  [phone]   Residential & Outpatient Substance Abuse Program  [phone]   Psychological Services Organization         Address  Phone  Notes  [facility] Behavioral Health  [phone]   [facility] Services  [phone]   [facility] [address] [place] [phone] or  [phone]    Mobile Crisis Teams Organization         Address  Phone  Notes  [facility], Mobile Crisis Care Unit  [phone]   [facility] Psych[facility]  [address] [place], [place]   [facility] [address], [place] [phone]    Self-Help/Support Groups Organization         Address  Phone             Notes  [place]. of [place] - variety of support groups  [place] Call for more information  Narcotics Anonymous (NA), [facility] [address], [employer] [place]  2 meetings at this location   Special educational needs teacher         Address  Phone  Notes  [facility]idential [facility] [place],    [employer]  [phone]   [facility]  [address] [place] [id number], [place], [place]   [place], [place] [phone] Admissions: 8am-3pm M-F  Incentives Substance [place] [address]    [place], [place] [phone]   [facility] [address] [place], [place], [place]   [facility] [address],  [place], [place]   [facility] - Intensive Outpatient [address], [place], [place]   [facility] [place] [facility]  [place], [place] [phone] or [phone]   [facility] Services ([facility]) [address], [place], [place] Accepts Medicaid  [facility] [place] [address],  [place] [phone] Substance Abuse/Addiction Treatment   [facility] Organization         Address  Phone  Notes  [facility]  [phone]   Domenic Schwab, PhD [address] Arlis [place], [place]   [phone] or [phone]   [place], [place] [phone]   [facility] [address] [place], [place] [phone] Insurance/Medicaid/sponsorship through [facility] and Families [address]                                    [place], [place] [phone] [place] [address], [place] [phone]    Dr. Adele Schilder  [phone]   [facility] Dept. 1) [address] [place] 2) [place] 3)  [place] [address], [place] [phone]  [phone]   [place] [phone] or [phone] (After Hours)

## 2013-09-10 NOTE — Progress Notes (Signed)
Occupational Therapy Treatment and Discharge Patient Details Name: [name] MRN: [id number] DOB: 1925-12-13 Today's Date: 09/10/2013    History of present illness Pt is an 80 y/o female admitted with sepsis due to UTI. PMH significant for A-fib, SSS s/p pacemaker 2014, and DM.    OT comments  Pt is functioning at a modified independent level in ADL and ADL transfers. She is routinely taking herself to the bathroom and performing standing grooming here in the hospital. She could benefit from a shower seat or 3 in 1 for safety.  Pt cannot afford DME not covered by her insurance.  Follow Up Recommendations  No OT follow up    Equipment Recommendations  3 in 1 bedside comode (only if covered by insurance)    Recommendations for Other Services      Precautions / Restrictions Precautions Precautions: Fall       Mobility Bed Mobility Overal bed mobility: Independent                Transfers Overall transfer level: Modified independent Equipment used: Straight cane Transfers: Sit to/from Stand                Balance                                   ADL Overall ADL's : Modified independent;At baseline                                 Tub/ Shower Transfer: Walk-in shower;Modified independent     General ADL Comments: Pt would like to have a shower seat.  Suggested a  3 in 1 as pt cannot afford items not covered by insurance.  Placed note in treatment team sticky notes.      Vision                     Perception     Praxis      Cognition   Behavior During Therapy: WFL for tasks assessed/performed Overall Cognitive Status: Within Functional Limits for tasks assessed                       Extremity/Trunk Assessment               Exercises     Shoulder Instructions       General Comments      Pertinent Vitals/ Pain       No pain, VSS  Home Living                                           Prior Functioning/Environment              Frequency Min 2X/week     Progress Toward Goals  OT Goals(current goals can now be found in the care plan section)  Progress towards OT goals: Goals met/education completed, patient discharged from OT  Acute Rehab OT Goals Patient Stated Goal: To return to her home  Plan Discharge plan remains appropriate    Co-evaluation                 End of Session     Activity Tolerance Patient tolerated treatment well   Patient  Left in bed;with call bell/phone within reach   Nurse Communication          Time: [phone] OT Time Calculation (min): 12 min  Charges: OT General Charges $OT Visit: 1 Procedure OT Treatments $Self Care/Home Management : 8-22 mins  Haze Boyden Myrle Dues 09/10/2013, 9:08 AM [phone]

## 2013-09-10 NOTE — Progress Notes (Signed)
Physical Therapy Treatment and Discharge Patient Details Name: [name] MRN: [id number] DOB: 1925-11-04 Today's Date: 10-05-2013    History of Present Illness Pt is an 80 y/o female admitted with sepsis due to UTI. PMH significant for A-fib, SSS s/p pacemaker 2014, and DM.     PT Comments    Pt reports being independent in room with ADL's, and per OT eval this morning, pt at modified independent level for all mobility. Pt declines OOB with therapy stating she is tired, and feels she no longer needs PT services. Pt education regarding safety at home and benefits of continued mobility. Pt states she anticipates d/c home today. PT signing off at this time. If needs change, please reconsult.   Follow Up Recommendations  No PT follow up;Supervision for mobility/OOB     Equipment Recommendations  None recommended by PT    Recommendations for Other Services       Precautions / Restrictions Precautions Precautions: Fall Restrictions Weight Bearing Restrictions: No    Mobility  Bed Mobility Overal bed mobility: Independent             General bed mobility comments: Pt required no cueing or assist to transition to/from EOB.   Transfers                 General transfer comment: Pt declined OOB  Ambulation/Gait             General Gait Details: Pt declined ambulation with therapy   Stairs            Wheelchair Mobility    Modified Rankin (Stroke Patients Only)       Balance                                    Cognition Arousal/Alertness: Awake/alert Behavior During Therapy: WFL for tasks assessed/performed Overall Cognitive Status: Within Functional Limits for tasks assessed                      Exercises      General Comments General comments (skin integrity, edema, etc.): Pt education regarding safety at home and benefits of continued mobility.      Pertinent Vitals/Pain Vitals stable throughout session.      Home Living                      Prior Function            PT Goals (current goals can now be found in the care plan section) Acute Rehab PT Goals Patient Stated Goal: To return to her home Progress towards PT goals: Goals met/education completed, patient discharged from PT    Frequency  Min 3X/week    PT Plan Current plan remains appropriate    Co-evaluation             End of Session   Activity Tolerance: Patient tolerated treatment well Patient left: in bed;with call bell/phone within reach (Sitting EOB)     Time: 1202-1211 PT Time Calculation (min): 9 min  Charges:  $Self Care/Home Management: January 02, 2023                    G Codes:      Jolyn Lent October 05, 2013, 1:19 PM  Jolyn Lent, PT, DPT Acute Rehabilitation Services Pager: [phone]

## 2013-09-10 NOTE — Progress Notes (Signed)
Patient instructions discussed with daughter, Christena Deem.  Patient denies pain. IV removed.  Patient discharged via wheelchair. Shellee Milo, RN

## 2013-09-10 NOTE — Progress Notes (Signed)
Subjective:   Day of hospitalzation: 6  VSS.  Pt admitted with UTI complicated by E.coli bacteremia.   Down 1.8L since admission.    Objective:   Vital signs in last 24 hours: Filed Vitals:   09/09/13 1715 09/09/13 2041 09/09/13 2258 09/10/13 0501  BP: 110/49 113/71 106/72 133/55  Pulse: 63 72 80 70  Temp:  98 F (36.7 C)  98.4 F (36.9 C)  TempSrc:  Axillary  Oral  Resp:  18  18  Height:      Weight:    117 lb 8 oz (53.298 kg)  SpO2:  99%  98%    Weight: Filed Weights   09/08/13 0529 09/09/13 0644 09/10/13 0501  Weight: 120 lb 1.6 oz (54.477 kg) 117 lb 12.8 oz (53.434 kg) 117 lb 8 oz (53.298 kg)    Ins/Outs:  Intake/Output Summary (Last 24 hours) at 09/10/13 0815 Last data filed at 09/10/13 0501  Gross per 24 hour  Intake    560 ml  Output   2575 ml  Net  -2015 ml    Physical Exam: Constitutional: Vital signs reviewed.  Elderly, frail appearing female sitting up in bed in no acute distress and cooperative with exam.   HEENT: [place]/AT; PERRL, EOMI, conjunctivae normal, no scleral icterus  Cardiovascular: irregular rhythm, systolic murmur Pulmonary/Chest: normal respiratory effort, no accessory muscle use, CTAB Abdominal: Thin. Soft. +BS, NT/ND Neurological: A&O x3, CN II-XII grossly intact; non-focal exam Extremities: 2+DP b/l, no C/C/E  Skin: Warm, dry and intact. No rash  Lab Results:  BMP:  Recent Labs Lab 09/07/13 0523 09/08/13 0417  NA 135* 138  K 4.3 3.9  CL 101 100  CO2 26 28  GLUCOSE 109* 115*  BUN 11 10  CREATININE 0.95 0.97  CALCIUM 8.7 9.2   Anion Gap:  10  CBC:  Recent Labs Lab 09/07/13 0523 09/08/13 0417  WBC 4.5 4.8  NEUTROABS 2.5 2.4  HGB 8.9* 10.3*  HCT 26.0* 29.1*  MCV 94.2 93.9  PLT 95* 137*    Coagulation: No results found for this basename: LABPROT, INR,  in the last 168 hours  CBG:           No results found for this basename: GLUCAP,  in the last 168 hours         LFTs: No results found for this  basename: AST, ALT, ALKPHOS, BILITOT, PROT, ALBUMIN,  in the last 168 hours  Pancreatic Enzymes: No results found for this basename: LIPASE, AMYLASE,  in the last 168 hours  Lactic Acid/Procalcitonin:  Recent Labs Lab 09/04/13 1805  LATICACIDVEN 1.14   Cardiac Enzymes:  Recent Labs Lab 09/05/13 0959  TROPONINI <0.30    EKG: EKG Interpretation  Date/Time:  Wednesday Sep 04 2013 17:32:31 EDT Ventricular Rate:  124 PR Interval:    QRS Duration: 84 QT Interval:  298 QTC Calculation: 428 R Axis:   -13 Text Interpretation:  Atrial fibrillation with rapid ventricular response with premature ventricular or aberrantly conducted complexes Marked ST abnormality, possible inferior subendocardial injury Abnormal ECG ED PHYSICIAN INTERPRETATION AVAILABLE IN [place] Confirmed by TEST, Record ([id number]) on 09/06/2013 7:19:53 AM  BNP: No results found for this basename: PROBNP,  in the last 168 hours  D-Dimer: No results found for this basename: DDIMER,  in the last 168 hours  Urinalysis:  Recent Labs Lab 09/04/13 2030  [place] 1.008  PHURINE 7.0  [place]  PROTEINUR 30*  UROBILINOGEN 2.0*  NITRITE POSITIVE*  LEUKOCYTESUR LARGE*    Micro Results: Recent Results (from the past 240 hour(s))  CULTURE, BLOOD (ROUTINE X 2)     Status: None   Collection Time    09/04/13  5:54 PM      Result Value Ref Range Status   Specimen Description BLOOD ARM LEFT   Final   Special Requests BOTTLES DRAWN AEROBIC AND ANAEROBIC B 6CC R 5CC   Final   Culture  Setup Time     Final   Value: 09/04/2013 21:46     Performed at [facility]   Culture     Final   Value: ESCHERICHIA COLI     Note: Gram Stain Report Called to,Read Back By and Verified With: MEREDITH ROSENBERGER 09/05/13 AT 0625 RIDK     Performed at [facility]   Report Status 09/07/2013 FINAL   Final   Organism ID,  Bacteria ESCHERICHIA COLI   Final  CULTURE, BLOOD (ROUTINE X 2)     Status: None   Collection Time    09/04/13  6:15 PM      Result Value Ref Range Status   Specimen Description BLOOD RIGHT ARM   Final   Special Requests BOTTLES DRAWN AEROBIC AND ANAEROBIC 5CC   Final   Culture  Setup Time     Final   Value: 09/05/2013 00:42     Performed at [facility]   Culture     Final   Value: ESCHERICHIA COLI     Note: SUSCEPTIBILITIES PERFORMED ON PREVIOUS CULTURE WITHIN THE LAST 5 DAYS.     STAPHYLOCOCCUS SPECIES (COAGULASE NEGATIVE)     Note: THE SIGNIFICANCE OF ISOLATING THIS ORGANISM FROM A SINGLE SET OF BLOOD CULTURES WHEN MULTIPLE SETS ARE DRAWN IS UNCERTAIN. PLEASE NOTIFY THE MICROBIOLOGY DEPARTMENT WITHIN ONE WEEK IF SPECIATION AND SENSITIVITIES ARE REQUIRED.     22 Note: Gram Stain Report Called to,Read Back By and Verified With: EMILY M@12 :35PM ON 09/05/13 BY DANTS Gram Stain Report Called to,Read Back By and Verified With: Sheilah Mins RN 5 15 1210 EDMOJ   Report Status 09/07/2013 FINAL   Final  URINE CULTURE     Status: None   Collection Time    09/04/13  8:30 PM      Result Value Ref Range Status   Specimen Description URINE, CATHETERIZED   Final   Special Requests NONE   Final   Culture  Setup Time     Final   Value: 09/04/2013 20:58     Performed at [facility]     Final   Value: 30,000 COLONIES/ML     Performed at [facility]   Culture     Final   Value: Multiple bacterial morphotypes present, none predominant. Suggest appropriate recollection if clinically indicated.     Performed at [facility]   Report Status 09/06/2013 FINAL   Final  CULTURE, BLOOD (ROUTINE X 2)     Status: None   Collection Time    09/07/13  2:01 PM      Result Value Ref Range Status   Specimen Description BLOOD LEFT FOREARM   Final   Special Requests BOTTLES DRAWN AEROBIC ONLY 3CC   Final   Culture  Setup Time     Final   Value: 09/07/2013  18:41     Performed at [facility]     Final  Value:        BLOOD CULTURE RECEIVED NO GROWTH TO DATE CULTURE WILL BE HELD FOR 5 DAYS BEFORE ISSUING A FINAL NEGATIVE REPORT     Performed at [facility]   Report Status PENDING   Incomplete    Blood Culture:    Component Value Date/Time   SDES BLOOD LEFT FOREARM 09/07/2013 1401   SPECREQUEST BOTTLES DRAWN AEROBIC ONLY 3CC 09/07/2013 1401   CULT  Value:        BLOOD CULTURE RECEIVED NO GROWTH TO DATE CULTURE WILL BE HELD FOR 5 DAYS BEFORE ISSUING A FINAL NEGATIVE REPORT Performed at [facility] 09/07/2013 1401   REPTSTATUS PENDING 09/07/2013 1401    Studies/Results: No results found.  Medications:  Scheduled Meds: . aspirin  81 mg Oral Daily  . cefTRIAXone (ROCEPHIN)  IV  1 g Intravenous Q24H  . furosemide  20 mg Oral BID  . metoprolol tartrate  100 mg Oral BID  . sodium chloride  3 mL Intravenous Q12H   Continuous Infusions:   PRN Meds: acetaminophen, docusate sodium  Antibiotics: Antibiotics Given (last 72 hours)   Date/Time Action Medication Dose Rate   09/07/13 1843 Given   cefTRIAXone (ROCEPHIN) 1 g in dextrose 5 % 50 mL IVPB 1 g 100 mL/hr   09/08/13 1835 Given   cefTRIAXone (ROCEPHIN) 1 g in dextrose 5 % 50 mL IVPB 1 g 100 mL/hr   09/09/13 1715 Given   cefTRIAXone (ROCEPHIN) 1 g in dextrose 5 % 50 mL IVPB 1 g 100 mL/hr      Day of Hospitalization: 6   Consults:    Assessment/Plan:   Principal Problem:   Sepsis due to urinary tract infection Active Problems:   CHF (congestive heart failure)   Hypertension   Atrial fibrillation with RVR   Bacteremia due to Escherichia coli   Thrombocytopenia  UTI, complicated by gram negative bacteremia Pt asymptomatic.  She was admitted w/ dysuria, increased frequency, urgency, fever,chills, malaise for 5-7 days. Pt w/ h/o streptococcal UTI.  Afebrile without leukocytosis.  BCx 5/20 shows gnr (2/2 E.coli); 1/2 staph coagulase neg).       -consulted ID, appreciate recs -5/23 Repeat BCx pending  -continue cefriaxone IV (5/20-5/25) for 5 days, transition to po abx today -I/O's  -APAP PRN fever  Chronic A-fib Stable.  HR controlled.  Follows w/ EP, Dr. Lovena Le has PPM for sick sinus syndrome as well.  Not on anti-coag d/t h/o falls and anemia.  On ASA 81mg .  -telemetry -continue ASA 81mg  qd -continue metoprolol 100mg  bid  Diastolic dysfunction, grade II TTE 05/14/13: EF of 50-55%, LA dilation, PAP 57 mm Hg.  Pt euvolemic on exam.    -continue metoprolol and lasix   HTN Stable.  -continue home meds  Chronic urinary incontinence On Vesicare 10 mg po qd at home.  -would d/c vesicare  h/o controlled DM type II Diet controlled.  Last HA1c 07/09/13: 6.2.    VTE PPx  SCDs  FEN -heart healthy diet  Disposition Anticipated discharge today.  Will have completed IV abx for 5 days per ID recs and transition to po today.      LOS: 6 days   Jones Bales, MD PGY-1, Internal Medicine Teaching Service [phone] (7AM-5PM Mon-Fri) 09/10/2013, 8:15 AM

## 2013-09-13 LAB — CULTURE, BLOOD (ROUTINE X 2): Culture: NO GROWTH

## 2013-09-16 ENCOUNTER — Ambulatory Visit: Payer: Commercial Managed Care - HMO | Admitting: Internal Medicine

## 2013-09-16 NOTE — Discharge Summary (Signed)
Reviewed. Agree with documentation. 

## 2013-09-25 ENCOUNTER — Other Ambulatory Visit: Payer: Self-pay | Admitting: Internal Medicine

## 2013-10-01 ENCOUNTER — Encounter: Payer: Self-pay | Admitting: Licensed Clinical Social Worker

## 2013-10-16 ENCOUNTER — Encounter: Payer: Self-pay | Admitting: Internal Medicine

## 2013-10-16 ENCOUNTER — Ambulatory Visit (INDEPENDENT_AMBULATORY_CARE_PROVIDER_SITE_OTHER): Payer: Commercial Managed Care - HMO | Admitting: Internal Medicine

## 2013-10-16 VITALS — BP 167/88 | HR 99 | Temp 98.5°F | Ht 64.0 in | Wt 119.1 lb

## 2013-10-16 DIAGNOSIS — I5032 Chronic diastolic (congestive) heart failure: Secondary | ICD-10-CM

## 2013-10-16 DIAGNOSIS — N393 Stress incontinence (female) (male): Secondary | ICD-10-CM

## 2013-10-16 DIAGNOSIS — E538 Deficiency of other specified B group vitamins: Secondary | ICD-10-CM

## 2013-10-16 DIAGNOSIS — Z8679 Personal history of other diseases of the circulatory system: Secondary | ICD-10-CM

## 2013-10-16 DIAGNOSIS — I4891 Unspecified atrial fibrillation: Secondary | ICD-10-CM

## 2013-10-16 DIAGNOSIS — I10 Essential (primary) hypertension: Secondary | ICD-10-CM

## 2013-10-16 DIAGNOSIS — J309 Allergic rhinitis, unspecified: Secondary | ICD-10-CM | POA: Insufficient documentation

## 2013-10-16 DIAGNOSIS — D696 Thrombocytopenia, unspecified: Secondary | ICD-10-CM

## 2013-10-16 DIAGNOSIS — J302 Other seasonal allergic rhinitis: Secondary | ICD-10-CM

## 2013-10-16 DIAGNOSIS — J3089 Other allergic rhinitis: Secondary | ICD-10-CM

## 2013-10-16 DIAGNOSIS — N39490 Overflow incontinence: Secondary | ICD-10-CM

## 2013-10-16 DIAGNOSIS — M810 Age-related osteoporosis without current pathological fracture: Secondary | ICD-10-CM

## 2013-10-16 LAB — COMPLETE METABOLIC PANEL WITH GFR
ALBUMIN: 4.4 g/dL (ref 3.5–5.2)
ALK PHOS: 107 U/L (ref 39–117)
ALT: 12 U/L (ref 0–35)
AST: 40 U/L — ABNORMAL HIGH (ref 0–37)
BUN: 18 mg/dL (ref 6–23)
CALCIUM: 9.4 mg/dL (ref 8.4–10.5)
CO2: 27 mEq/L (ref 19–32)
Chloride: 100 mEq/L (ref 96–112)
Creat: 1.16 mg/dL — ABNORMAL HIGH (ref 0.50–1.10)
GFR, EST NON AFRICAN AMERICAN: 43 mL/min — AB
GFR, Est African American: 50 mL/min — ABNORMAL LOW
Glucose, Bld: 85 mg/dL (ref 70–99)
POTASSIUM: 4 meq/L (ref 3.5–5.3)
Sodium: 138 mEq/L (ref 135–145)
Total Bilirubin: 1.9 mg/dL — ABNORMAL HIGH (ref 0.2–1.2)
Total Protein: 8 g/dL (ref 6.0–8.3)

## 2013-10-16 LAB — CBC WITH DIFFERENTIAL/PLATELET
Basophils Absolute: 0 10*3/uL (ref 0.0–0.1)
Basophils Relative: 0 % (ref 0–1)
Eosinophils Absolute: 0 10*3/uL (ref 0.0–0.7)
Eosinophils Relative: 1 % (ref 0–5)
HEMATOCRIT: 35.8 % — AB (ref 36.0–46.0)
Hemoglobin: 12.1 g/dL (ref 12.0–15.0)
LYMPHS PCT: 44 % (ref 12–46)
Lymphs Abs: 1.9 10*3/uL (ref 0.7–4.0)
MCH: 31.3 pg (ref 26.0–34.0)
MCHC: 33.8 g/dL (ref 30.0–36.0)
MCV: 92.7 fL (ref 78.0–100.0)
Monocytes Absolute: 0.4 10*3/uL (ref 0.1–1.0)
Monocytes Relative: 10 % (ref 3–12)
NEUTROS ABS: 1.9 10*3/uL (ref 1.7–7.7)
Neutrophils Relative %: 45 % (ref 43–77)
PLATELETS: 163 10*3/uL (ref 150–400)
RBC: 3.86 MIL/uL — ABNORMAL LOW (ref 3.87–5.11)
RDW: 15.2 % (ref 11.5–15.5)
WBC: 4.3 10*3/uL (ref 4.0–10.5)

## 2013-10-16 LAB — VITAMIN B12: Vitamin B-12: 376 pg/mL (ref 211–911)

## 2013-10-16 MED ORDER — METOPROLOL TARTRATE 100 MG PO TABS: 100.0000 mg | ORAL_TABLET | Freq: Two times a day (BID) | ORAL | Status: DC

## 2013-10-16 MED ORDER — LORATADINE 10 MG PO TABS: 10.0000 mg | ORAL_TABLET | Freq: Every day | ORAL | Status: DC | PRN

## 2013-10-16 MED ORDER — LISINOPRIL 40 MG PO TABS: 40.0000 mg | ORAL_TABLET | Freq: Every day | ORAL | Status: DC

## 2013-10-16 NOTE — Assessment & Plan Note (Signed)
We spoke at length about whether she should take this medication today.  Taking the medication does put a patient at increased risk of a UTI, which Ms. Yankowski was just in the hospital for.  There is also the risk for exacerbation of her dementia and she is on donepezil.  We discussed the drug drug interactions.  Her daughter was in the room with her and after discussion, they decided to remain on both medications at this time.  They will watch closely for signs of a developing UTI and we will discuss later if she needs to remain on donepezil.

## 2013-10-16 NOTE — Assessment & Plan Note (Signed)
HR is irregular today, but 99.  She is on a beta blocker and with pacemaker for SSS.  She is not a candidate for anticoagulation given fall risk.

## 2013-10-16 NOTE — Assessment & Plan Note (Signed)
Checking B12 today.

## 2013-10-16 NOTE — Patient Instructions (Addendum)
General Instructions: Please schedule a follow up visit within the next 3-4 months.   For your medications:   Please bring all of your pill  Bottles with you to each visit.  This will help make sure that we have an up to date list of all the medications you are taking.  Please also bring any over the counter herbal medications you are taking (not including advil, tylenol, etc.)  Please continue taking all of your medications as prescribed.  You had some elevated blood pressure today, but that seems to be due to your not having taken your medications yet this morning.  Please take your blood pressure medications when you come next time!  Thank you!  You are on Vesicare which can put you at risk for another urinary infection.  Please monitor how many times you go to the bathroom daily and make sure you are peeing at least 4 times per day.  If you start to develop symptoms of pain when you pee, bad smelling urine, pain in your abdomen, difficulty going to the bathroom, you will need to stop the medication and come to the clinic to be evaluated.   For your symptoms of allergies, please start taking claritin 10mg  daily, this has been sent to your pharmacy.  Please let me know if this does not start helping you.   For your thin bones (osteoporosis) please start taking calcium and vitamin D.  You can find these over the counter at your pharmacy.     Thank you!   Treatment Goals:  Goals (1 Years of Data) as of 10/16/13         As of Today 09/10/13 09/10/13 09/10/13 09/10/13     Blood Pressure    . Blood Pressure < 140/90  175/91 104/57 103/42 125/64 133/55     Diet    . Have 3 meals a day           Result Component    . HEMOGLOBIN A1C < 7.0          . LDL CALC < 100            Progress Toward Treatment Goals:  Treatment Goal 10/16/2013  Hemoglobin A1C -  Blood pressure deteriorated  Prevent falls -    Self Care Goals & Plans:  Self Care Goal 10/16/2013  Manage my medications take my  medicines as prescribed; bring my medications to every visit; refill my medications on time  Monitor my health -  Eat healthy foods eat foods that are low in salt; eat baked foods instead of fried foods; eat more vegetables  Be physically active -  Prevent falls -  Meeting treatment goals -    Home Blood Glucose Monitoring 10/29/2012  Check my blood sugar no home glucose monitoring  When to check my blood sugar -     Care Management & Community Referrals:  Referral 05/23/2012  Referrals made for care management support none needed

## 2013-10-16 NOTE — Assessment & Plan Note (Signed)
BP Readings from Last 3 Encounters:  10/16/13 167/88  09/10/13 104/57  07/09/13 133/60    Lab Results  Component Value Date   NA 138 09/08/2013   K 3.9 09/08/2013   CREATININE 0.97 09/08/2013    Assessment: Blood pressure control: moderately elevated Progress toward BP goal:  deteriorated Comments: On recheck, continued to be elevated.  She had not taken her medications this morning.   Plan: Medications:  continue current medications Educational resources provided:   Self management tools provided:   Other plans: She is taking lisinopril, lasix, metoprolol.  Checking renal function today.

## 2013-10-16 NOTE — Assessment & Plan Note (Signed)
Pacemaker in place, no issue.  HR today was 99.

## 2013-10-16 NOTE — Assessment & Plan Note (Signed)
Have asked her to start taking calcium and vitamin D.  Would consider repeat DEXA at next visit.

## 2013-10-16 NOTE — Progress Notes (Signed)
   Subjective:    Patient ID: [name], female    DOB: Aug 03, 1925, 80 y.o.   MRN: [id number]  HPI  [name] is an 80yo woman with PMH of sick sinus syndrome (PCM in place), dCHF (TTE 04/2012, but noted to have diastolic), HTN, Afib with RVR, h/o stroke, stress incontinence who presents for follow up after hospital stay.   [name] was admitted in May of this year for a UTI and sepsis.  She was treated successfully for an E. Coli UTI and is doing well.  She has had no return of symptoms.  She was requested to stop taking her vesicare at that time, but has continued taking it due to her urinary incontinence.    [name] reports that she has had cold symptoms for the last 2-3 months.  These consist of rhinorrhea, watery eyes, crust on the eyes in the AM, occasional dry cough.  She has no headache or sputum production, no fever or chills.  She has not tried anything for the symptoms.   [name] further reports that her iron causes constipation so she does not take it regularly.  Her BP was elevated today, but she had not taken her medications.  She carries a diagnosis of dCHF, last TTE in 2014.  She does not have any chest pains or signs of volume overload today.    She is not smoking.  Tried smoking > 30 years ago but "never inhaled"  Medications reviewed.  Lisinopril, lasix, metoprolol, aspirin, colace, donepezil, vesicare    Review of Systems  Constitutional: Negative for fever and chills.  HENT: Negative for ear discharge, ear pain and sore throat.   Eyes: Negative for pain and redness.  Respiratory: Positive for cough (occasional, dry). Negative for shortness of breath and wheezing.   Cardiovascular: Negative for chest pain and palpitations.  Gastrointestinal: Positive for constipation (with iron). Negative for abdominal pain and blood in stool.  Genitourinary: Positive for frequency (incontinence). Negative for dysuria and difficulty urinating.  Musculoskeletal:  Negative for arthralgias.  Neurological: Negative for dizziness, tremors, syncope, weakness, light-headedness and headaches.  Psychiatric/Behavioral: Positive for confusion (occasional memory difficulty).       Objective:   Physical Exam  Constitutional: She is oriented to person, place, and time.  Thin, elderly woman in NAD, alert  HENT:  Head: Normocephalic and atraumatic.  Eyes: Pupils are equal, round, and reactive to light. No scleral icterus.  She has darkened/yellow sclera, this is normal for her, no jaundice  Cardiovascular: Exam reveals no friction rub.   Irreg Irreg rhythm, no murmur, NR  Pulmonary/Chest: Effort normal and breath sounds normal. No respiratory distress. She has no wheezes.  Abdominal: Soft. There is no tenderness.  Musculoskeletal: She exhibits edema (minimal non pitting edema to feet). She exhibits no tenderness.  Neurological: She is alert and oriented to person, place, and time.  Skin: Skin is warm and dry. No rash noted. She is not diaphoretic. No erythema.  Psychiatric: She has a normal mood and affect. Her behavior is normal.    Labs: CBC and CMET today.       Assessment & Plan:  RTC in 3-4 months

## 2013-10-16 NOTE — Assessment & Plan Note (Signed)
Reports symptoms for multiple months and seems to happen seasonally.  Will trial claritin today for her.

## 2013-10-16 NOTE — Assessment & Plan Note (Signed)
Repeat CBC today 

## 2013-10-17 LAB — BILIRUBIN, DIRECT: BILIRUBIN DIRECT: 0.5 mg/dL — AB (ref 0.0–0.3)

## 2013-10-17 NOTE — Addendum Note (Signed)
Addended by: Gilles Chiquito B on: 10/17/2013 10:03 AM   Modules accepted: Orders

## 2013-10-21 ENCOUNTER — Telehealth: Payer: Self-pay | Admitting: Internal Medicine

## 2013-10-21 ENCOUNTER — Other Ambulatory Visit: Payer: Self-pay | Admitting: Internal Medicine

## 2013-10-21 NOTE — Telephone Encounter (Signed)
[name] has a bilirubinemia and I have added some blood work for her to come in and get.   I called the number listed and spoke with a family member asking her to call back.  She needs to come in for lab visit only for blood work this week.   Thanks  EBM

## 2013-10-21 NOTE — Progress Notes (Signed)
Patient ID: [name], female   DOB: 03-06-1926, 80 y.o.   MRN: [id number]  Update:   Patient had elevated Tbili and Dbili.   Will have her come in to get repeat CMET, CBC, retic count, haptoglobin, ggt and d bili.   EBM

## 2013-10-22 NOTE — Telephone Encounter (Signed)
Pt will be in Thursday for labs

## 2013-10-24 ENCOUNTER — Other Ambulatory Visit: Payer: Commercial Managed Care - HMO

## 2013-10-24 ENCOUNTER — Other Ambulatory Visit (INDEPENDENT_AMBULATORY_CARE_PROVIDER_SITE_OTHER): Payer: Commercial Managed Care - HMO

## 2013-10-24 DIAGNOSIS — R17 Unspecified jaundice: Secondary | ICD-10-CM

## 2013-10-24 LAB — CBC
HEMATOCRIT: 30.6 % — AB (ref 36.0–46.0)
HEMOGLOBIN: 10.4 g/dL — AB (ref 12.0–15.0)
MCH: 31.7 pg (ref 26.0–34.0)
MCHC: 34 g/dL (ref 30.0–36.0)
MCV: 93.3 fL (ref 78.0–100.0)
Platelets: 112 10*3/uL — ABNORMAL LOW (ref 150–400)
RBC: 3.28 MIL/uL — ABNORMAL LOW (ref 3.87–5.11)
RDW: 14.2 % (ref 11.5–15.5)
WBC: 4.2 10*3/uL (ref 4.0–10.5)

## 2013-10-24 LAB — RETICULOCYTES
ABS RETIC: 34.1 10*3/uL (ref 19.0–186.0)
RBC.: 3.28 MIL/uL — AB (ref 3.87–5.11)
Retic Ct Pct: 1.04 % (ref 0.4–2.3)

## 2013-10-25 ENCOUNTER — Telehealth: Payer: Self-pay | Admitting: Internal Medicine

## 2013-10-25 ENCOUNTER — Other Ambulatory Visit: Payer: Self-pay | Admitting: Internal Medicine

## 2013-10-25 LAB — COMPLETE METABOLIC PANEL WITH GFR
ALK PHOS: 121 U/L — AB (ref 39–117)
ALT: 13 U/L (ref 0–35)
AST: 32 U/L (ref 0–37)
Albumin: 4 g/dL (ref 3.5–5.2)
BILIRUBIN TOTAL: 1.4 mg/dL — AB (ref 0.2–1.2)
BUN: 18 mg/dL (ref 6–23)
CO2: 25 mEq/L (ref 19–32)
Calcium: 9.3 mg/dL (ref 8.4–10.5)
Chloride: 102 mEq/L (ref 96–112)
Creat: 1.31 mg/dL — ABNORMAL HIGH (ref 0.50–1.10)
GFR, Est African American: 43 mL/min — ABNORMAL LOW
GFR, Est Non African American: 37 mL/min — ABNORMAL LOW
Glucose, Bld: 135 mg/dL — ABNORMAL HIGH (ref 70–99)
Potassium: 4.1 mEq/L (ref 3.5–5.3)
Sodium: 142 mEq/L (ref 135–145)
Total Protein: 7.7 g/dL (ref 6.0–8.3)

## 2013-10-25 LAB — GAMMA GT: GGT: 77 U/L — ABNORMAL HIGH (ref 7–51)

## 2013-10-25 LAB — BILIRUBIN, DIRECT: BILIRUBIN DIRECT: 0.4 mg/dL — AB (ref 0.0–0.3)

## 2013-10-25 LAB — HAPTOGLOBIN: Haptoglobin: 104 mg/dL (ref 45–215)

## 2013-10-25 NOTE — Progress Notes (Signed)
Patient ID: [name], female   DOB: 1925-09-02, 80 y.o.   MRN: [id number] Repeat labs show elevated T bili, Dbili, ALP and GGT.  Haptoglobin normal.  Likely liver or GB disease.  She has had her GB out.  Would like to get RUQ ultrasound.   Called patient and she asked me to call her daughter, for whom I left a message to call back.   Gilles Chiquito, MD

## 2013-10-25 NOTE — Telephone Encounter (Signed)
Called and spoke to [name] regarding her blood work.   Would like to get a RUQ ultrasound, and I explained the study to her.    She asked me to call her daugher, Adrian Blackwater, which I did and left a message asking her to call the clinic.   Gilles Chiquito, MD

## 2013-10-28 ENCOUNTER — Ambulatory Visit (HOSPITAL_COMMUNITY)
Admission: RE | Admit: 2013-10-28 | Discharge: 2013-10-28 | Disposition: A | Discharge status: Home / Self Care | Payer: Medicare HMO | Source: Ambulatory Visit | Attending: Internal Medicine | Admitting: Internal Medicine

## 2013-10-28 DIAGNOSIS — R7989 Other specified abnormal findings of blood chemistry: Secondary | ICD-10-CM | POA: Insufficient documentation

## 2013-10-28 DIAGNOSIS — R17 Unspecified jaundice: Secondary | ICD-10-CM | POA: Insufficient documentation

## 2013-10-29 ENCOUNTER — Encounter: Payer: Commercial Managed Care - HMO | Admitting: Internal Medicine

## 2013-11-05 ENCOUNTER — Telehealth: Payer: Self-pay | Admitting: *Deleted

## 2013-11-05 NOTE — Telephone Encounter (Signed)
Call from pt's daughter - requesting result of Abd U/S, which show  "No acute abnormality is noted". Daughter informed of this - if ok. Thanks

## 2013-11-05 NOTE — Telephone Encounter (Signed)
Thanks Glenda!  Perfect, I will call them tomorrow as well to see if they have any questions.

## 2013-12-04 ENCOUNTER — Encounter: Payer: Commercial Managed Care - HMO | Admitting: Internal Medicine

## 2014-01-07 ENCOUNTER — Encounter: Payer: Commercial Managed Care - HMO | Admitting: Internal Medicine

## 2014-01-10 ENCOUNTER — Encounter: Payer: Self-pay | Admitting: Internal Medicine

## 2014-01-21 ENCOUNTER — Encounter (HOSPITAL_COMMUNITY): Payer: Self-pay | Admitting: Emergency Medicine

## 2014-01-21 ENCOUNTER — Emergency Department (HOSPITAL_COMMUNITY): Payer: Medicare HMO

## 2014-01-21 ENCOUNTER — Inpatient Hospital Stay (HOSPITAL_COMMUNITY)
Admission: EM | Admit: 2014-01-21 | Discharge: 2014-01-24 | DRG: 292 | Disposition: A | Discharge status: Home / Self Care | Payer: Medicare HMO | Attending: Oncology | Admitting: Oncology

## 2014-01-21 DIAGNOSIS — D638 Anemia in other chronic diseases classified elsewhere: Secondary | ICD-10-CM | POA: Diagnosis present

## 2014-01-21 DIAGNOSIS — I5042 Chronic combined systolic (congestive) and diastolic (congestive) heart failure: Secondary | ICD-10-CM | POA: Diagnosis present

## 2014-01-21 DIAGNOSIS — K219 Gastro-esophageal reflux disease without esophagitis: Secondary | ICD-10-CM | POA: Diagnosis present

## 2014-01-21 DIAGNOSIS — D61818 Other pancytopenia: Secondary | ICD-10-CM | POA: Diagnosis present

## 2014-01-21 DIAGNOSIS — I272 Other secondary pulmonary hypertension: Secondary | ICD-10-CM | POA: Diagnosis present

## 2014-01-21 DIAGNOSIS — I129 Hypertensive chronic kidney disease with stage 1 through stage 4 chronic kidney disease, or unspecified chronic kidney disease: Secondary | ICD-10-CM | POA: Diagnosis present

## 2014-01-21 DIAGNOSIS — D631 Anemia in chronic kidney disease: Secondary | ICD-10-CM | POA: Diagnosis present

## 2014-01-21 DIAGNOSIS — Z7982 Long term (current) use of aspirin: Secondary | ICD-10-CM

## 2014-01-21 DIAGNOSIS — Z8679 Personal history of other diseases of the circulatory system: Secondary | ICD-10-CM

## 2014-01-21 DIAGNOSIS — E876 Hypokalemia: Secondary | ICD-10-CM | POA: Diagnosis not present

## 2014-01-21 DIAGNOSIS — Z950 Presence of cardiac pacemaker: Secondary | ICD-10-CM

## 2014-01-21 DIAGNOSIS — I509 Heart failure, unspecified: Secondary | ICD-10-CM

## 2014-01-21 DIAGNOSIS — I5033 Acute on chronic diastolic (congestive) heart failure: Secondary | ICD-10-CM | POA: Diagnosis not present

## 2014-01-21 DIAGNOSIS — Z66 Do not resuscitate: Secondary | ICD-10-CM | POA: Diagnosis present

## 2014-01-21 DIAGNOSIS — E440 Moderate protein-calorie malnutrition: Secondary | ICD-10-CM | POA: Diagnosis present

## 2014-01-21 DIAGNOSIS — N183 Chronic kidney disease, stage 3 unspecified: Secondary | ICD-10-CM | POA: Diagnosis present

## 2014-01-21 DIAGNOSIS — R1310 Dysphagia, unspecified: Secondary | ICD-10-CM | POA: Diagnosis present

## 2014-01-21 DIAGNOSIS — E119 Type 2 diabetes mellitus without complications: Secondary | ICD-10-CM | POA: Diagnosis present

## 2014-01-21 DIAGNOSIS — Z8673 Personal history of transient ischemic attack (TIA), and cerebral infarction without residual deficits: Secondary | ICD-10-CM | POA: Diagnosis not present

## 2014-01-21 DIAGNOSIS — I482 Chronic atrial fibrillation: Secondary | ICD-10-CM | POA: Diagnosis present

## 2014-01-21 DIAGNOSIS — N393 Stress incontinence (female) (male): Secondary | ICD-10-CM | POA: Diagnosis present

## 2014-01-21 DIAGNOSIS — I10 Essential (primary) hypertension: Secondary | ICD-10-CM | POA: Diagnosis present

## 2014-01-21 DIAGNOSIS — N39490 Overflow incontinence: Secondary | ICD-10-CM

## 2014-01-21 DIAGNOSIS — Z87891 Personal history of nicotine dependence: Secondary | ICD-10-CM | POA: Diagnosis not present

## 2014-01-21 DIAGNOSIS — D649 Anemia, unspecified: Secondary | ICD-10-CM | POA: Diagnosis present

## 2014-01-21 DIAGNOSIS — I444 Left anterior fascicular block: Secondary | ICD-10-CM | POA: Diagnosis present

## 2014-01-21 DIAGNOSIS — I4891 Unspecified atrial fibrillation: Secondary | ICD-10-CM | POA: Diagnosis present

## 2014-01-21 HISTORY — DX: Type 2 diabetes mellitus without complications: E11.9

## 2014-01-21 LAB — COMPREHENSIVE METABOLIC PANEL
ALT: 9 U/L (ref 0–35)
AST: 25 U/L (ref 0–37)
Albumin: 3.7 g/dL (ref 3.5–5.2)
Alkaline Phosphatase: 125 U/L — ABNORMAL HIGH (ref 39–117)
Anion gap: 12 (ref 5–15)
BILIRUBIN TOTAL: 1.3 mg/dL — AB (ref 0.3–1.2)
BUN: 11 mg/dL (ref 6–23)
CHLORIDE: 102 meq/L (ref 96–112)
CO2: 25 meq/L (ref 19–32)
Calcium: 9.4 mg/dL (ref 8.4–10.5)
Creatinine, Ser: 1.21 mg/dL — ABNORMAL HIGH (ref 0.50–1.10)
GFR calc Af Amer: 46 mL/min — ABNORMAL LOW (ref >90)
GFR, EST NON AFRICAN AMERICAN: 39 mL/min — AB (ref >90)
Glucose, Bld: 89 mg/dL (ref 70–99)
Potassium: 4.7 mEq/L (ref 3.7–5.3)
SODIUM: 139 meq/L (ref 137–147)
Total Protein: 7.5 g/dL (ref 6.0–8.3)

## 2014-01-21 LAB — CBC WITH DIFFERENTIAL/PLATELET
BASOS ABS: 0 10*3/uL (ref 0.0–0.1)
Basophils Relative: 1 % (ref 0–1)
Eosinophils Absolute: 0.1 10*3/uL (ref 0.0–0.7)
Eosinophils Relative: 2 % (ref 0–5)
HEMATOCRIT: 32.7 % — AB (ref 36.0–46.0)
Hemoglobin: 11.2 g/dL — ABNORMAL LOW (ref 12.0–15.0)
LYMPHS PCT: 36 % (ref 12–46)
Lymphs Abs: 1.4 10*3/uL (ref 0.7–4.0)
MCH: 31.5 pg (ref 26.0–34.0)
MCHC: 34.3 g/dL (ref 30.0–36.0)
MCV: 91.9 fL (ref 78.0–100.0)
MONO ABS: 0.6 10*3/uL (ref 0.1–1.0)
Monocytes Relative: 17 % — ABNORMAL HIGH (ref 3–12)
NEUTROS ABS: 1.7 10*3/uL (ref 1.7–7.7)
Neutrophils Relative %: 46 % (ref 43–77)
PLATELETS: 143 10*3/uL — AB (ref 150–400)
RBC: 3.56 MIL/uL — AB (ref 3.87–5.11)
RDW: 13.9 % (ref 11.5–15.5)
WBC: 3.8 10*3/uL — AB (ref 4.0–10.5)

## 2014-01-21 LAB — TROPONIN I
Troponin I: <0.3 ng/mL (ref <0.30)
Troponin I: <0.3 ng/mL (ref <0.30)

## 2014-01-21 LAB — URINE MICROSCOPIC-ADD ON

## 2014-01-21 LAB — CBC
HCT: 33.7 % — ABNORMAL LOW (ref 36.0–46.0)
Hemoglobin: 11.3 g/dL — ABNORMAL LOW (ref 12.0–15.0)
MCH: 30.9 pg (ref 26.0–34.0)
MCHC: 33.5 g/dL (ref 30.0–36.0)
MCV: 92.1 fL (ref 78.0–100.0)
PLATELETS: 157 10*3/uL (ref 150–400)
RBC: 3.66 MIL/uL — ABNORMAL LOW (ref 3.87–5.11)
RDW: 13.9 % (ref 11.5–15.5)
WBC: 4.1 10*3/uL (ref 4.0–10.5)

## 2014-01-21 LAB — URINALYSIS, ROUTINE W REFLEX MICROSCOPIC
BILIRUBIN URINE: NEGATIVE
GLUCOSE, UA: NEGATIVE mg/dL
KETONES UR: NEGATIVE mg/dL
Nitrite: NEGATIVE
Protein, ur: NEGATIVE mg/dL
Specific Gravity, Urine: 1.006 (ref 1.005–1.030)
Urobilinogen, UA: 1 mg/dL (ref 0.0–1.0)
pH: 7 (ref 5.0–8.0)

## 2014-01-21 LAB — PRO B NATRIURETIC PEPTIDE: Pro B Natriuretic peptide (BNP): 10317 pg/mL — ABNORMAL HIGH (ref 0–450)

## 2014-01-21 LAB — CREATININE, SERUM
CREATININE: 1.3 mg/dL — AB (ref 0.50–1.10)
GFR calc Af Amer: 42 mL/min — ABNORMAL LOW (ref >90)
GFR calc non Af Amer: 36 mL/min — ABNORMAL LOW (ref >90)

## 2014-01-21 MED ORDER — ACETAMINOPHEN 650 MG RE SUPP
650.0000 mg | Freq: Four times a day (QID) | RECTAL | Status: DC | PRN
Start: 2014-01-21 — End: 2014-01-24

## 2014-01-21 MED ORDER — SODIUM CHLORIDE 0.9 % IJ SOLN
3.0000 mL | Freq: Two times a day (BID) | INTRAMUSCULAR | Status: DC
  Administered 2014-01-21 – 2014-01-24 (×7): 3 mL via INTRAVENOUS

## 2014-01-21 MED ORDER — FUROSEMIDE 10 MG/ML IJ SOLN
40.0000 mg | Freq: Two times a day (BID) | INTRAMUSCULAR | Status: DC
  Administered 2014-01-21: 40 mg via INTRAVENOUS
  Filled 2014-01-21 (×3): qty 4

## 2014-01-21 MED ORDER — ACETAMINOPHEN 325 MG PO TABS
650.0000 mg | ORAL_TABLET | Freq: Four times a day (QID) | ORAL | Status: DC | PRN
Start: 2014-01-21 — End: 2014-01-24
  Administered 2014-01-22: 650 mg via ORAL
  Filled 2014-01-21: qty 2

## 2014-01-21 MED ORDER — HEPARIN SODIUM (PORCINE) 5000 UNIT/ML IJ SOLN
5000.0000 [IU] | Freq: Three times a day (TID) | INTRAMUSCULAR | Status: DC
  Administered 2014-01-21 – 2014-01-24 (×10): 5000 [IU] via SUBCUTANEOUS
  Filled 2014-01-21 (×11): qty 1

## 2014-01-21 MED ORDER — DARIFENACIN HYDROBROMIDE ER 15 MG PO TB24
15.0000 mg | ORAL_TABLET | Freq: Every day | ORAL | Status: DC
  Administered 2014-01-21 – 2014-01-24 (×4): 15 mg via ORAL
  Filled 2014-01-21 (×4): qty 1

## 2014-01-21 MED ORDER — ASPIRIN EC 81 MG PO TBEC
81.0000 mg | DELAYED_RELEASE_TABLET | Freq: Every day | ORAL | Status: DC
  Administered 2014-01-21 – 2014-01-24 (×4): 81 mg via ORAL
  Filled 2014-01-21 (×4): qty 1

## 2014-01-21 MED ORDER — FUROSEMIDE 10 MG/ML IJ SOLN
40.0000 mg | Freq: Once | INTRAMUSCULAR | Status: AC
Start: 2014-01-21 — End: 2014-01-21
  Administered 2014-01-21: 40 mg via INTRAVENOUS
  Filled 2014-01-21: qty 4

## 2014-01-21 MED ORDER — LISINOPRIL 40 MG PO TABS
40.0000 mg | ORAL_TABLET | Freq: Every day | ORAL | Status: DC
  Administered 2014-01-21 – 2014-01-24 (×4): 40 mg via ORAL
  Filled 2014-01-21 (×4): qty 1

## 2014-01-21 MED ORDER — METOPROLOL TARTRATE 100 MG PO TABS
100.0000 mg | ORAL_TABLET | Freq: Two times a day (BID) | ORAL | Status: DC
  Administered 2014-01-21 – 2014-01-24 (×6): 100 mg via ORAL
  Filled 2014-01-21 (×10): qty 1

## 2014-01-21 NOTE — ED Notes (Signed)
Lunch ordered for pt

## 2014-01-21 NOTE — ED Provider Notes (Signed)
CSN: [id number]     Arrival date & time 01/21/14  6295 History   First MD Initiated Contact with Patient 01/21/14 [phone]     Chief Complaint  Patient presents with  . Leg Swelling     (Consider location/radiation/quality/duration/timing/severity/associated sxs/prior Treatment) HPI Comments: Patient presents to the ER for evaluation of progressively worsening lower extremity swelling with shortness of breath. Symptoms have been present for 4 weeks. She reports that she has gained a significant amount of weight and she is having trouble walking because of pain in her legs and shortness of breath. Shortness of breath significantly worsens with exertion. She is not experiencing chest pain. Patient reports taking Lasix 20 mg twice a day for congestive heart failure.   Past Medical History  Diagnosis Date  . Weight loss, unintentional 2010-2011    8/08-8/10 138-146 lbs, between 8/10-10/11 lost 18 lbs., work-up negative  . Incontinence overflow, stress female     on vesicare  . History of sick sinus syndrome s/p pacemaker Dr. Lovena Le 2000  . Atrial fibrillation     on coumadin followed by Dr. Elie Confer  . History of vaginal bleeding 05/2005    nagative endometrial biopsy  . Restrictive lung disease 1996    PFT's showed mild disease  . Anemia     Iron deficiency, does not tolerate po  . CHF (congestive heart failure)     diastolic dysfunction  . Diabetes mellitus     type 2  diet controlled  . Hypertension     well controlled on 3 agents  . Osteoporosis   . Stroke 2002    R MCA, cardioembolic  . Allergic rhinitis   . Onychomycosis   . History of trichomonal urethritis   . Arthritis   . DJD (degenerative joint disease)   . Constipation   . Diverticulosis of colon (without mention of hemorrhage)    Past Surgical History  Procedure Laterality Date  . Abdominal hysterectomy    . Cholecystectomy    . Pacemaker insertion  2000; 2014    MDT Sigma pacemaker implanted by Dr Lovena Le; gen  change AdaptaL 10/2012 by Dr Lovena Le  . Cataract extraction     Family History  Problem Relation Age of Onset  . Diabetes Sister   . Heart disease     History  Substance Use Topics  . Smoking status: Former Research scientist (life sciences)  . Smokeless tobacco: Never Used  . Alcohol Use: 0.0 oz/week    0 drink(s) per week     Comment: occasional use   OB History   Grav Para Term Preterm Abortions TAB SAB Ect Mult Living                 Review of Systems  Respiratory: Positive for shortness of breath.   Cardiovascular: Positive for leg swelling. Negative for chest pain.  All other systems reviewed and are negative.     Allergies  Review of patient's allergies indicates no known allergies.  Home Medications   Prior to Admission medications   Medication Sig Start Date End Date Taking? Authorizing Provider  aspirin EC 81 MG tablet Take 81 mg by mouth daily.   Yes Historical Provider, MD  diclofenac sodium (VOLTAREN) 1 % GEL Apply 2 g topically 4 (four) times daily as needed (for pain).    Yes Historical Provider, MD  furosemide (LASIX) 20 MG tablet Take 1 tablet (20 mg total) by mouth 2 (two) times daily. 10/29/12  Yes Marrion Coy, MD  lisinopril (PRINIVIL,ZESTRIL) 40 MG  tablet Take 1 tablet (40 mg total) by mouth daily. 10/16/13  Yes Sid Falcon, MD  metoprolol (LOPRESSOR) 100 MG tablet Take 1 tablet (100 mg total) by mouth 2 (two) times daily. 10/16/13  Yes Sid Falcon, MD  solifenacin (VESICARE) 10 MG tablet Take 10 mg by mouth daily.   Yes Historical Provider, MD   BP 154/105  Pulse 93  Temp(Src) 98 F (36.7 C) (Oral)  Resp 23  SpO2 98% Physical Exam  Constitutional: She is oriented to person, place, and time. She appears well-developed and well-nourished. No distress.  HENT:  Head: Normocephalic and atraumatic.  Right Ear: Hearing normal.  Left Ear: Hearing normal.  Nose: Nose normal.  Mouth/Throat: Oropharynx is clear and moist and mucous membranes are normal.  Eyes:  Conjunctivae and EOM are normal. Pupils are equal, round, and reactive to light.  Neck: Normal range of motion. Neck supple. JVD present.  Cardiovascular: Regular rhythm, S1 normal and S2 normal.  Exam reveals no gallop and no friction rub.   No murmur heard. Pulmonary/Chest: Effort normal. No respiratory distress. She has rales. She exhibits no tenderness.  Abdominal: Soft. Normal appearance and bowel sounds are normal. There is no hepatosplenomegaly. There is no tenderness. There is no rebound, no guarding, no tenderness at McBurney's point and negative Murphy's sign. No hernia.  Musculoskeletal: Normal range of motion. She exhibits edema.  Neurological: She is alert and oriented to person, place, and time. She has normal strength. No cranial nerve deficit or sensory deficit. Coordination normal. GCS eye subscore is 4. GCS verbal subscore is 5. GCS motor subscore is 6.  Skin: Skin is warm, dry and intact. No rash noted. No cyanosis.  Psychiatric: She has a normal mood and affect. Her speech is normal and behavior is normal. Thought content normal.    ED Course  Procedures (including critical care time) Labs Review Labs Reviewed  CBC WITH DIFFERENTIAL - Abnormal; Notable for the following:    WBC 3.8 (*)    RBC 3.56 (*)    Hemoglobin 11.2 (*)    HCT 32.7 (*)    Platelets 143 (*)    Monocytes Relative 17 (*)    All other components within normal limits  COMPREHENSIVE METABOLIC PANEL - Abnormal; Notable for the following:    Creatinine, Ser 1.21 (*)    Alkaline Phosphatase 125 (*)    Total Bilirubin 1.3 (*)    GFR calc non Af Amer 39 (*)    GFR calc Af Amer 46 (*)    All other components within normal limits  URINALYSIS, ROUTINE W REFLEX MICROSCOPIC - Abnormal; Notable for the following:    APPearance CLOUDY (*)    Hgb urine dipstick SMALL (*)    Leukocytes, UA SMALL (*)    All other components within normal limits  PRO B NATRIURETIC PEPTIDE - Abnormal; Notable for the following:     Pro B Natriuretic peptide (BNP) 10317.0 (*)    All other components within normal limits  TROPONIN I  URINE MICROSCOPIC-ADD ON    Imaging Review Dg Chest 2 View  01/21/2014   CLINICAL DATA:  Shortness of breath, bilateral leg swelling  EXAM: CHEST  2 VIEW  COMPARISON:  09/04/2013  FINDINGS: Cardiomegaly is noted. Single lead cardiac pacemaker is unchanged in position. Central mild vascular congestion and mild perihilar interstitial prominence without convincing pulmonary edema. No segmental infiltrate. Thoracic spine osteopenia.  IMPRESSION: Cardiomegaly. Central mild vascular congestion and mild perihilar interstitial prominence without convincing pulmonary  edema. No segmental infiltrate   Electronically Signed   By: Lahoma Crocker M.D.   On: 01/21/2014 09:38     EKG Interpretation   Date/Time:  Tuesday January 21 2014 09:43:56 EDT Ventricular Rate:  102 PR Interval:    QRS Duration: 88 QT Interval:  366 QTC Calculation: 477 R Axis:   -56 Text Interpretation:  Atrial fibrillation Left anterior fascicular block  Probable anterior infarct, age indeterminate Lateral leads are also  involved Baseline wander in lead(s) V5 No significant change since last  tracing Confirmed by POLLINA  MD, [place] [phone]) on 01/21/2014  10:17:30 AM      MDM   Final diagnoses:  None   congestive heart failure exacerbation  Patient with previous history of atrial fibrillation, congestive heart failure, diabetes presents to ER for evaluation of progressively worsening shortness of breath increased swelling of her legs. She has not significant hypoxic, but reports a significantly decreased exercise tolerance. She is not experiencing chest pain. EKG shows baseline atrial fibrillation, borderline rapid ventricular response. There is no ischemic change noted. Workup thus support diagnosis of decompensated congestive heart failure. Patient will require hospitalization for further  management.    Orpah Greek, MD 01/21/14 1128

## 2014-01-21 NOTE — ED Notes (Signed)
Patient transported to X-ray 

## 2014-01-21 NOTE — ED Notes (Signed)
Pt reports bilateral leg swelling and SOB x 4 weeks making it difficult for her to walk.

## 2014-01-21 NOTE — ED Notes (Signed)
Attempted Report x1.   

## 2014-01-21 NOTE — H&P (Signed)
Date: 01/21/2014               Patient Name:  [name] MRN: [id number]  DOB: Oct 06, 1925 Age / Sex: 80 y.o., female   PCP: Sid Falcon, MD         Medical Service: Internal Medicine Teaching Service         Attending Physician: Dr. Annia Belt, MD    First Contact: MS4 Jake Bathe Pager: [phone]  Second Contact: Dr. Joni Reining Pager: [phone]       After Hours (After 5p/  First Contact Pager: [phone]  weekends / holidays): Second Contact Pager: [phone]   Chief Complaint: Lower extremity swelling and weakness  History of Present Illness: [name] is a 80 yo female with PMH of dCHF, sick sinus syndrome s/p PPM, HTN, A fib (no A/C due to GI bleed/high fall risk), OA, DM2.  She presents with CC of lower extremity edema.  She reports that she has had worsening lower extremity edema for the last 3 weeks, this has been associated with some non descript pain in her lower extremities and some generalized weakness and fatigue.  She denies any SOB or chest pain.  She admits 2 pillow orthopnea but denies any worsening of this over the last few weeks.  She does admit some coughing.  She reports she has been taking lasix 20mg  once a day for CHF (she is prescribed this twice a day).  Meds: Current Facility-Administered Medications  Medication Dose Route Frequency Provider Last Rate Last Dose  . acetaminophen (TYLENOL) tablet 650 mg  650 mg Oral Q6H PRN Lucious Groves, DO       Or  . acetaminophen (TYLENOL) suppository 650 mg  650 mg Rectal Q6H PRN Lucious Groves, DO      . aspirin EC tablet 81 mg  81 mg Oral Daily Lucious Groves, DO      . darifenacin (ENABLEX) 24 hr tablet 15 mg  15 mg Oral Daily Lucious Groves, DO      . furosemide (LASIX) injection 40 mg  40 mg Intravenous BID Lucious Groves, DO      . heparin injection 5,000 Units  5,000 Units Subcutaneous 3 times per day Lucious Groves, DO      . lisinopril (PRINIVIL,ZESTRIL) tablet 40 mg  40 mg Oral Daily Lucious Groves, DO      . metoprolol (LOPRESSOR) tablet 100 mg  100 mg Oral BID Lucious Groves, DO      . sodium chloride 0.9 % injection 3 mL  3 mL Intravenous Q12H Lucious Groves, DO        Allergies: Allergies as of 01/21/2014  . (No Known Allergies)   Past Medical History  Diagnosis Date  . Weight loss, unintentional 2010-2011    8/08-8/10 138-146 lbs, between 8/10-10/11 lost 18 lbs., work-up negative  . Incontinence overflow, stress female     on vesicare  . History of sick sinus syndrome s/p pacemaker Dr. Lovena Le 2000  . Atrial fibrillation     on coumadin followed by Dr. Elie Confer  . History of vaginal bleeding 05/2005    nagative endometrial biopsy  . Restrictive lung disease 1996    PFT's showed mild disease  . Anemia     Iron deficiency, does not tolerate po  . CHF (congestive heart failure)     diastolic dysfunction  . Diabetes mellitus     type 2  diet controlled  .  Hypertension     well controlled on 3 agents  . Osteoporosis   . Stroke 2002    R MCA, cardioembolic  . Allergic rhinitis   . Onychomycosis   . History of trichomonal urethritis   . Arthritis   . DJD (degenerative joint disease)   . Constipation   . Diverticulosis of colon (without mention of hemorrhage)    Past Surgical History  Procedure Laterality Date  . Abdominal hysterectomy    . Cholecystectomy    . Pacemaker insertion  2000; 2014    MDT Sigma pacemaker implanted by Dr Lovena Le; gen change AdaptaL 10/2012 by Dr Lovena Le  . Cataract extraction     Family History  Problem Relation Age of Onset  . Diabetes Sister   . Heart disease     History   Social History  . Marital Status: Widowed    Spouse Name: N/A    Number of Children: 8  . Years of Education: N/A   Occupational History  . retired    Social History Main Topics  . Smoking status: Former Research scientist (life sciences)  . Smokeless tobacco: Never Used  . Alcohol Use: 0.0 oz/week    0 drink(s) per week     Comment: occasional use  . Drug Use: No  .  Sexual Activity: Not on file   Other Topics Concern  . Not on file   Social History Narrative   [employer]   [name] home# [phone]   Cell# [phone]      Patient lives alone, but has 7, of 8, living children who check in on her.   She is widowed.    Review of Systems: Review of Systems  Constitutional: Negative for fever, chills, weight loss and malaise/fatigue.  Eyes: Negative for blurred vision.  Respiratory: Negative for cough and shortness of breath.   Cardiovascular: Positive for orthopnea (stable 2 pillow) and leg swelling. Negative for chest pain.  Gastrointestinal: Negative for heartburn, abdominal pain, diarrhea and constipation.  Genitourinary: Negative for dysuria.  Musculoskeletal: Negative for myalgias.  Skin: Negative for rash.  Neurological: Negative for dizziness and headaches.  All other systems reviewed and are negative.    Physical Exam: Blood pressure 137/72, pulse 97, temperature 98 F (36.7 C), temperature source Oral, resp. rate 22, height 5\' 2"  (1.575 m), weight 129 lb 13.6 oz (58.9 kg), SpO2 96.00%. Physical Exam  Nursing note and vitals reviewed. Constitutional: She is well-developed, well-nourished, and in no distress.  HENT:  Head: Normocephalic and atraumatic.  Cardiovascular: Normal heart sounds and intact distal pulses.   JVD to angle of jaw, irregularly irregular  Pulmonary/Chest: Effort normal. No respiratory distress. She has no wheezes. She has rales (mild left basilar).  Abdominal: Soft. Bowel sounds are normal.  Musculoskeletal: She exhibits edema (2+ to knee bilaterally).  Skin: Skin is warm and dry.     Lab results: Basic Metabolic Panel:  Recent Labs  01/21/14 0942  NA 139  K 4.7  CL 102  CO2 25  GLUCOSE 89  BUN 11  CREATININE 1.21*  CALCIUM 9.4   Liver Function Tests:  Recent Labs  01/21/14 0942  AST 25  ALT 9  ALKPHOS 125*  BILITOT 1.3*  PROT 7.5  ALBUMIN 3.7   No results found for this basename:  LIPASE, AMYLASE,  in the last 72 hours No results found for this basename: AMMONIA,  in the last 72 hours CBC:  Recent Labs  01/21/14 0942  WBC 3.8*  NEUTROABS 1.7  HGB 11.2*  HCT 32.7*  MCV 91.9  PLT 143*   Cardiac Enzymes:  Recent Labs  01/21/14 0942  TROPONINI <0.30   BNP:  Recent Labs  01/21/14 0942  PROBNP 10317.0*   D-Dimer: No results found for this basename: DDIMER,  in the last 72 hours CBG: No results found for this basename: GLUCAP,  in the last 72 hours Hemoglobin A1C: No results found for this basename: HGBA1C,  in the last 72 hours Fasting Lipid Panel: No results found for this basename: CHOL, HDL, LDLCALC, TRIG, CHOLHDL, LDLDIRECT,  in the last 72 hours Thyroid Function Tests: No results found for this basename: TSH, T4TOTAL, FREET4, T3FREE, THYROIDAB,  in the last 72 hours Anemia Panel: No results found for this basename: VITAMINB12, FOLATE, FERRITIN, TIBC, IRON, RETICCTPCT,  in the last 72 hours Coagulation: No results found for this basename: LABPROT, INR,  in the last 72 hours Urine Drug Screen: Drugs of Abuse  No results found for this basename: labopia, cocainscrnur, labbenz, amphetmu, thcu, labbarb    Alcohol Level: No results found for this basename: ETH,  in the last 72 hours Urinalysis:  Recent Labs  01/21/14 1029  COLORURINE YELLOW  LABSPEC 1.006  PHURINE 7.0  GLUCOSEU NEGATIVE  HGBUR SMALL*  BILIRUBINUR NEGATIVE  KETONESUR NEGATIVE  PROTEINUR NEGATIVE  UROBILINOGEN 1.0  NITRITE NEGATIVE  LEUKOCYTESUR SMALL*    Imaging results:  Dg Chest 2 View  01/21/2014   CLINICAL DATA:  Shortness of breath, bilateral leg swelling  EXAM: CHEST  2 VIEW  COMPARISON:  09/04/2013  FINDINGS: Cardiomegaly is noted. Single lead cardiac pacemaker is unchanged in position. Central mild vascular congestion and mild perihilar interstitial prominence without convincing pulmonary edema. No segmental infiltrate. Thoracic spine osteopenia.  IMPRESSION:  Cardiomegaly. Central mild vascular congestion and mild perihilar interstitial prominence without convincing pulmonary edema. No segmental infiltrate   Electronically Signed   By: Lahoma Crocker M.D.   On: 01/21/2014 09:38    Other results: EKG: a fib, HR close to 100, TWI in lead I, II, aVL, V5, V6, no significant change from previous  Assessment & Plan by Problem:   Acute on chronic diastolic congestive heart failure - Increased BNP, vascular congestion on CXR, weight gain of 10 lbs.  Patient started on IV lasix 40mg  with good UOP. - Lasix 40mg  BID - Monitor weight, I &O - Monitor electrolytes - Trend trop - Continue BB    Hypertension - Continue home medications    Atrial fibrillation with RVR - HR moderately controlled, will continue Metoprolol 100mg  BID - According to PCP notes not candidate for A/C due to GI bleed/ high fall risk - Continue ASA    Incontinence overflow, stress female - Continue Enablex    Normocytic anemia -History of IDA, was previously on iron supplementation which appears to have fallen off med list in May, also has history of B12 def. - Check Anemia panel with AM labs    CKD (chronic kidney disease) stage 3, GFR 30-59 ml/min - Near baseline, will repeat with AM labs, and monitor during diuresis  Code Status: Patient reports desire for DNR she reports she has relayed this message to her family and has discussed this previously with her PCP Dispo: Disposition is deferred at this time, awaiting improvement of current medical problems. Anticipated discharge in approximately 2 day(s).   The patient does have a current PCP Sid Falcon, MD) and does need an [facility] hospital follow-up appointment after discharge.  The patient does not have transportation  limitations that hinder transportation to clinic appointments.  Signed: Lucious Groves, DO 01/21/2014, 3:21 PM

## 2014-01-21 NOTE — ED Notes (Signed)
Pt remains in xray; family waiting at bedside.

## 2014-01-21 NOTE — H&P (Signed)
Date: 01/21/2014               Patient Name:  [name] MRN: [id number]  DOB: [birth year] Age / Sex: 80 y.o., female   PCP: Sid Falcon, MD              Medical Service: Internal Medicine Teaching Service              Attending Physician: Dr. Annia Belt, MD    First Contact: Jake Bathe, MS4 Pager: [phone]  Second Contact: Dr. Joni Reining Pager: [phone]       After Hours (After 5p/  First Contact Pager: [phone]  weekends / holidays): Second Contact Pager: [phone]   Chief Complaint: leg swelling  History of Present Illness: [name] is an 80 yo F with history of HTN, A-fib (not on Coumadin, previously stopped secondary to sub-acute GIB), dCHF, sick sinus syndrome s/p PPM placement, h/o CVA 2002 (R MCA, cardioembolic), OA, and DM type II (not on any medications) who presents with lower extremity edema. She states the swelling has progressively worsened over the past 3 weeks and has caused her sharp pain in her feet and legs while walking. She also endorses SOB over the past 3 weeks that has limited her ability to ambulate. She sleeps with 2 pillows at night but has not required any additional pillows over the past several weeks. She also endorses nonproductive cough x1 month but denies fevers or chills. She reports she has noticed some weight gain over the past month. She denies chest pain or palpitations. Patient reports taking Lasix 20 mg twice a day for congestive heart failure and states she is compliant with her medications. She has never experienced similar symptoms in the past.   Meds: Current Facility-Administered Medications  Medication Dose Route Frequency Provider Last Rate Last Dose  . acetaminophen (TYLENOL) tablet 650 mg  650 mg Oral Q6H PRN Lucious Groves, DO       Or  . acetaminophen (TYLENOL) suppository 650 mg  650 mg Rectal Q6H PRN Lucious Groves, DO      . aspirin EC tablet 81 mg  81 mg Oral Daily Lucious Groves, DO      . darifenacin  (ENABLEX) 24 hr tablet 15 mg  15 mg Oral Daily Lucious Groves, DO      . furosemide (LASIX) injection 40 mg  40 mg Intravenous BID Lucious Groves, DO      . heparin injection 5,000 Units  5,000 Units Subcutaneous 3 times per day Lucious Groves, DO      . lisinopril (PRINIVIL,ZESTRIL) tablet 40 mg  40 mg Oral Daily Lucious Groves, DO      . metoprolol (LOPRESSOR) tablet 100 mg  100 mg Oral BID Lucious Groves, DO      . sodium chloride 0.9 % injection 3 mL  3 mL Intravenous Q12H Lucious Groves, DO        Allergies: Allergies as of 01/21/2014  . (No Known Allergies)   Past Medical History  Diagnosis Date  . Weight loss, unintentional 2010-2011    8/08-8/10 138-146 lbs, between 8/10-10/11 lost 18 lbs., work-up negative  . Incontinence overflow, stress female     on vesicare  . History of sick sinus syndrome s/p pacemaker Dr. Lovena Le 2000  . Atrial fibrillation     on coumadin followed by Dr. Elie Confer  . History of vaginal bleeding 05/2005    nagative endometrial biopsy  .  Restrictive lung disease 1996    PFT's showed mild disease  . Anemia     Iron deficiency, does not tolerate po  . CHF (congestive heart failure)     diastolic dysfunction  . Hypertension     well controlled on 3 agents  . Osteoporosis   . Allergic rhinitis   . Onychomycosis   . History of trichomonal urethritis   . Constipation   . Diverticulosis of colon (without mention of hemorrhage)   . Pacemaker   . Type II diabetes mellitus     diet controlled  . Stroke 2002    R MCA, cardioembolic  . Arthritis     "all over my body"  . DJD (degenerative joint disease)    Past Surgical History  Procedure Laterality Date  . Cholecystectomy    . Cataract extraction    . Abdominal hysterectomy    . Pacemaker generator change  10/2012    AdaptaL 10/2012 by Dr Lovena Le  . Insert / replace / remove pacemaker  2000    MDT Sigma pacemaker implanted by Dr Lovena Le;   Family History  Problem Relation Age of Onset  . Diabetes  Sister   . Heart disease     History   Social History  . Marital Status: Widowed    Spouse Name: N/A    Number of Children: 8  . Years of Education: N/A   Occupational History  . retired    Social History Main Topics  . Smoking status: Never Smoker   . Smokeless tobacco: Never Used  . Alcohol Use: Yes     Comment: "might have a glass of wine once/year"  . Drug Use: No  . Sexual Activity: No   Other Topics Concern  . Not on file   Social History Narrative   [employer]   [name] home# [phone]   Cell# [phone]      Patient lives alone, but has 7, of 8, living children who check in on her.   She is widowed.    Review of Systems: Constitutional: negative for chills and sweats Cardiovascular: negative for chest pain, paroxysmal nocturnal dyspnea and syncope Gastrointestinal: negative for abdominal pain  Physical Exam: Blood pressure 137/72, pulse 97, temperature 98 F (36.7 C), temperature source Oral, resp. rate 22, height 5\' 2"  (1.575 m), weight 58.9 kg (129 lb 13.6 oz), SpO2 96.00%. General appearance: alert and cooperative Head: Normocephalic, without obvious abnormality, atraumatic Eyes: conjunctivae w/ mild jaundice/ dark discoloration. PERRL, EOM's intact. Neck: JVD to angle of mandible at 45 degrees, no carotid bruit, tortuous neck veins Lungs: few crackles at lung bases bilaterally Heart: irregularly irregular rhythm, normal S1/S2, no S3 or S4 Abdomen: soft, non-tender; bowel sounds normal Extremities: extremities normal, atraumatic, no cyanosis, 2+ pitting edema to mid-calf bilaterally Pulses: 2+ and symmetric Skin: Skin color, texture, turgor normal. No rashes or lesions Neurologic: Grossly normal  Lab results: Results for orders placed during the hospital encounter of 01/21/14 (from the past 24 hour(s))  CBC WITH DIFFERENTIAL     Status: Abnormal   Collection Time    01/21/14  9:42 AM      Result Value Ref Range   WBC 3.8 (*) 4.0 - 10.5 K/uL    RBC 3.56 (*) 3.87 - 5.11 MIL/uL   Hemoglobin 11.2 (*) 12.0 - 15.0 g/dL   HCT 32.7 (*) 36.0 - 46.0 %   MCV 91.9  78.0 - 100.0 fL   MCH 31.5  26.0 - 34.0 pg   MCHC  34.3  30.0 - 36.0 g/dL   RDW 13.9  11.5 - 15.5 %   Platelets 143 (*) 150 - 400 K/uL   Neutrophils Relative % 46  43 - 77 %   Neutro Abs 1.7  1.7 - 7.7 K/uL   Lymphocytes Relative 36  12 - 46 %   Lymphs Abs 1.4  0.7 - 4.0 K/uL   Monocytes Relative 17 (*) 3 - 12 %   Monocytes Absolute 0.6  0.1 - 1.0 K/uL   Eosinophils Relative 2  0 - 5 %   Eosinophils Absolute 0.1  0.0 - 0.7 K/uL   Basophils Relative 1  0 - 1 %   Basophils Absolute 0.0  0.0 - 0.1 K/uL  COMPREHENSIVE METABOLIC PANEL     Status: Abnormal   Collection Time    01/21/14  9:42 AM      Result Value Ref Range   Sodium 139  137 - 147 mEq/L   Potassium 4.7  3.7 - 5.3 mEq/L   Chloride 102  96 - 112 mEq/L   CO2 25  19 - 32 mEq/L   Glucose, Bld 89  70 - 99 mg/dL   BUN 11  6 - 23 mg/dL   Creatinine, Ser 1.21 (*) 0.50 - 1.10 mg/dL   Calcium 9.4  8.4 - 10.5 mg/dL   Total Protein 7.5  6.0 - 8.3 g/dL   Albumin 3.7  3.5 - 5.2 g/dL   AST 25  0 - 37 U/L   ALT 9  0 - 35 U/L   Alkaline Phosphatase 125 (*) 39 - 117 U/L   Total Bilirubin 1.3 (*) 0.3 - 1.2 mg/dL   GFR calc non Af Amer 39 (*) >90 mL/min   GFR calc Af Amer 46 (*) >90 mL/min   Anion gap 12  5 - 15  TROPONIN I     Status: None   Collection Time    01/21/14  9:42 AM      Result Value Ref Range   Troponin I <0.30  <0.30 ng/mL  PRO B NATRIURETIC PEPTIDE     Status: Abnormal   Collection Time    01/21/14  9:42 AM      Result Value Ref Range   Pro B Natriuretic peptide (BNP) 10317.0 (*) 0 - 450 pg/mL  URINALYSIS, ROUTINE W REFLEX MICROSCOPIC     Status: Abnormal   Collection Time    01/21/14 10:29 AM      Result Value Ref Range   Color, Urine YELLOW  YELLOW   APPearance CLOUDY (*) CLEAR   Specific Gravity, Urine 1.006  1.005 - 1.030   pH 7.0  5.0 - 8.0   Glucose, UA NEGATIVE  NEGATIVE mg/dL   Hgb urine  dipstick SMALL (*) NEGATIVE   Bilirubin Urine NEGATIVE  NEGATIVE   Ketones, ur NEGATIVE  NEGATIVE mg/dL   Protein, ur NEGATIVE  NEGATIVE mg/dL   Urobilinogen, UA 1.0  0.0 - 1.0 mg/dL   Nitrite NEGATIVE  NEGATIVE   Leukocytes, UA SMALL (*) NEGATIVE  URINE MICROSCOPIC-ADD ON     Status: None   Collection Time    01/21/14 10:29 AM      Result Value Ref Range   Squamous Epithelial / LPF RARE  RARE   WBC, UA 0-2  <3 WBC/hpf   RBC / HPF 0-2  <3 RBC/hpf   Bacteria, UA RARE  RARE    Imaging results:  Dg Chest 2 View  01/21/2014   CLINICAL DATA:  Shortness of breath, bilateral leg swelling  EXAM: CHEST  2 VIEW  COMPARISON:  09/04/2013  FINDINGS: Cardiomegaly is noted. Single lead cardiac pacemaker is unchanged in position. Central mild vascular congestion and mild perihilar interstitial prominence without convincing pulmonary edema. No segmental infiltrate. Thoracic spine osteopenia.  IMPRESSION: Cardiomegaly. Central mild vascular congestion and mild perihilar interstitial prominence without convincing pulmonary edema. No segmental infiltrate   Electronically Signed   By: Lahoma Crocker M.D.   On: 01/21/2014 09:38   Other results: EKG: Atrial fibrillation, old anterior infarct, no acute ST/T wave abnormalities, unchanged from previous tracing  Assessment & Plan by Problem: Principal Problem:   Acute on chronic diastolic congestive heart failure Active Problems:   Hypertension   Atrial fibrillation with RVR   Incontinence overflow, stress female   GERD (gastroesophageal reflux disease)   Normocytic anemia   CKD (chronic kidney disease) stage 3, GFR 30-59 ml/min  1. Acute on chronic diastolic CHF: Last ECHO in Jan 2014 showed LV EF: 50% - 55%. BNP this admission 10,317 (last BNP 1/14 was 992). Weight is up 10lbs over a month ago. CXR with cardiomegaly and central mild vascular congestion and mild perihilar interstitial prominence without convincing pulmonary edema. She has diuresed -2L thus far  on 40mg  IV Lasix BID. -Continue Lasix, 40mg  IV BID -Strict I&O, daily weights -BMP tomorrow morning  2. Atrial fibrillation with RVR: Patient admitted w/ tachycardia into the 130's, now down to 97. She has past h/o cardioembolic stroke. She was previously been on Coumadin for anti-coagulation, however it was stopped after she was found to have symptomatic anemia in the setting of a sub-acute GI Bleed. Is currently on aspirin 81mg  daily and metoprolol 100mg  daily. Follows w/ Dr. Lovena Le, has PPM for sick sinus syndrome as well.  -Cardiac monitoring  -Continue metoprolol 100mg  daily -Continue aspirin 81mg  daily   3. Normocytic anemia: Hgb 11.2 on admission. Patient found to have iron deficiency anemia in the past and was previously on iron therapy, however it was never restarted after her last admission -can restart iron therapy  4. HTN: normotensive on admission. On Lisinopril 40mg  daily, metoprolol 100mg  daily -Continue home meds  Urinary Incontinence, stress- On Darifenacin 15mg  at home -Continue Darifenacin  DM type II- Controlled w/ diet alone -heart healthy diet  4. CKD stage 3: Cr 1.21 (baseline around 1.0) -monitor creatinine, BMP tomorrow morning  This is a Careers information officer Note.  The care of the patient was discussed with Dr. Heber [place] and the assessment and plan was formulated with their assistance.  Please see their note for official documentation of the patient encounter.   Signed: Ciro Backer, Med Student 01/21/2014, 4:01 PM

## 2014-01-22 DIAGNOSIS — E119 Type 2 diabetes mellitus without complications: Secondary | ICD-10-CM

## 2014-01-22 DIAGNOSIS — N393 Stress incontinence (female) (male): Secondary | ICD-10-CM

## 2014-01-22 DIAGNOSIS — N183 Chronic kidney disease, stage 3 (moderate): Secondary | ICD-10-CM

## 2014-01-22 DIAGNOSIS — E440 Moderate protein-calorie malnutrition: Secondary | ICD-10-CM | POA: Diagnosis present

## 2014-01-22 DIAGNOSIS — I4891 Unspecified atrial fibrillation: Secondary | ICD-10-CM

## 2014-01-22 DIAGNOSIS — I129 Hypertensive chronic kidney disease with stage 1 through stage 4 chronic kidney disease, or unspecified chronic kidney disease: Secondary | ICD-10-CM

## 2014-01-22 DIAGNOSIS — I5033 Acute on chronic diastolic (congestive) heart failure: Principal | ICD-10-CM

## 2014-01-22 DIAGNOSIS — D649 Anemia, unspecified: Secondary | ICD-10-CM

## 2014-01-22 LAB — FOLATE: Folate: 13.9 ng/mL

## 2014-01-22 LAB — CBC
HEMATOCRIT: 34.5 % — AB (ref 36.0–46.0)
Hemoglobin: 11.8 g/dL — ABNORMAL LOW (ref 12.0–15.0)
MCH: 32.2 pg (ref 26.0–34.0)
MCHC: 34.2 g/dL (ref 30.0–36.0)
MCV: 94 fL (ref 78.0–100.0)
PLATELETS: 154 10*3/uL (ref 150–400)
RBC: 3.67 MIL/uL — ABNORMAL LOW (ref 3.87–5.11)
RDW: 13.9 % (ref 11.5–15.5)
WBC: 4.8 10*3/uL (ref 4.0–10.5)

## 2014-01-22 LAB — IRON AND TIBC
Iron: 38 ug/dL — ABNORMAL LOW (ref 42–135)
SATURATION RATIOS: 12 % — AB (ref 20–55)
TIBC: 307 ug/dL (ref 250–470)
UIBC: 269 ug/dL (ref 125–400)

## 2014-01-22 LAB — VITAMIN B12: Vitamin B-12: 877 pg/mL (ref 211–911)

## 2014-01-22 LAB — BASIC METABOLIC PANEL
Anion gap: 14 (ref 5–15)
BUN: 16 mg/dL (ref 6–23)
CALCIUM: 9.6 mg/dL (ref 8.4–10.5)
CO2: 29 mEq/L (ref 19–32)
Chloride: 95 mEq/L — ABNORMAL LOW (ref 96–112)
Creatinine, Ser: 1.35 mg/dL — ABNORMAL HIGH (ref 0.50–1.10)
GFR calc Af Amer: 40 mL/min — ABNORMAL LOW (ref >90)
GFR, EST NON AFRICAN AMERICAN: 34 mL/min — AB (ref >90)
GLUCOSE: 137 mg/dL — AB (ref 70–99)
Potassium: 3.5 mEq/L — ABNORMAL LOW (ref 3.7–5.3)
Sodium: 138 mEq/L (ref 137–147)

## 2014-01-22 LAB — RETICULOCYTES
RBC.: 3.73 MIL/uL — ABNORMAL LOW (ref 3.87–5.11)
RETIC COUNT ABSOLUTE: 56 10*3/uL (ref 19.0–186.0)
Retic Ct Pct: 1.5 % (ref 0.4–3.1)

## 2014-01-22 LAB — FERRITIN: FERRITIN: 52 ng/mL (ref 10–291)

## 2014-01-22 MED ORDER — POTASSIUM CHLORIDE CRYS ER 20 MEQ PO TBCR
20.0000 meq | EXTENDED_RELEASE_TABLET | Freq: Once | ORAL | Status: AC
  Administered 2014-01-22: 20 meq via ORAL

## 2014-01-22 MED ORDER — FUROSEMIDE 10 MG/ML IJ SOLN
20.0000 mg | Freq: Two times a day (BID) | INTRAMUSCULAR | Status: DC
  Administered 2014-01-22 (×2): 20 mg via INTRAVENOUS
  Filled 2014-01-22 (×2): qty 2

## 2014-01-22 MED ORDER — POTASSIUM CHLORIDE CRYS ER 20 MEQ PO TBCR
40.0000 meq | EXTENDED_RELEASE_TABLET | Freq: Once | ORAL | Status: AC
  Administered 2014-01-22: 40 meq via ORAL

## 2014-01-22 NOTE — H&P (Signed)
Medicine attending admission note: I personally interviewed and examined this patient together with medical resident Dr. Johnnette Gourd and fourth year medical student Ms. Jake Bathe and I concur with their evaluation and management plan.  Clinical summary: Pleasant 80 year old woman with known hypertension, ischemic cardiac disease, status post pacemaker placement for sick sinus syndrome, chronic atrial fibrillation. She was said to have diastolic cardiac dysfunction in the past but she had a normal left ventricular systolic ejection fraction of 50-55% on most recent echocardiogram done 05/14/2012 with some evidence for valvular heart disease with mild mitral and aortic valve regurgitation and moderate tricuspid valve regurgitation. Pulmonary artery hypertension with estimated pressure 57 mm but normal right ventricular systolic function. She presents with increasing dyspnea, bilateral lower extremity edema, and an approximate 10 to weight gain. On initial exam blood pressure 137/72, pulse 97 and irregularly irregular, respirations 22, weight 129 pounds compared with weight 126 pounds recorded on a clinic visit 07/09/2013. Jugular venous distention to the angle of the jaw. Minimal left basilar rales. Extremities with 2+ edema. Database: Initial troponin undetectable ProBNP 10,317 compare with 996 recorded in January of 2014 Chest radiograph with cardiomegaly and early cephalization of the vessels Electrocardiogram: Atrial fibrillation ventricular rate 100. Left anterior fascicular block. T wave inversions in leads 1, 2, aVL, V5, and V6, consistent with an age indeterminate inferior infarct with no change from prior tracing and no acute abnormalities. Current exam: Blood pressure 127/54, pulse 75, temperature 97.5 F (36.4 C), temperature source Oral, resp. rate 18, height $RemoveBe'5\' 2"'bcfiwPTnv$  (1.575 m), weight 122 lb 12.8 oz (55.702 kg), SpO2 97.00%. Pronounced arcus senilis. Poor air movement over the lungs with  barely audible breath sounds. Hyperresonant to percussion. Minimal rales at the right base.  irregularly irregular cardiac rhythm. No obvious murmur. Asymmetrical lower extremity edema left greater than right. Left calf is firm but not tender.  Impression: #1. Early congestive heart failure She has already had a rapid response to diuretics. She is breathing comfortably. Lungs are overall clear although breath sounds are markedly decreased bilaterally. She had a  diuresis of over 3 L overnight. We will continue parenteral diuretics for another 24 hours and then transition to oral diuretics. Continue to monitor cardiac status and rule out MI. We may need to get a followup echocardiogram. Cardiology input from Dr. Osie Cheeks as needed.  #2. Asymmetrical lower extremity edema We will get a venous Doppler study to rule out left lower extremity DVT Thrombo- prophylaxis.  #3. Chronic atrial fibrillation. Per chart records, anticoagulation was discontinued in the past secondary to patient gait instability and multiple falls with increased risk versus benefit ratio  #4. Sick sinus syndrome status post permanent pacemaker implant  #5. Chronic normochromic anemia. Some component likely secondary to chronic renal insufficiency with estimated GFR 40 mL per minute. Of note she was admitted in January 2014 with severe anemia hemoglobin as low as 4.6. She had a normal upper and lower endoscopy in August 2011 except for a benign sessile polyp in the colon that was removed. It appears that the studies were not repeated last year. Iron studies were borderline at that time with a ferritin of 14 which would not be low enough to explain a severe anemia. We may need to reevaluate her anemia. Screen for multiple myeloma.  Murriel Hopper, MD, [place]  Hematology-Oncology/Internal Medicine

## 2014-01-22 NOTE — Progress Notes (Signed)
Chaplain responded to consult for prayer. Pt was asleep.   Will return later.   Delford Field, [place] 01/22/2014 3:37 PM

## 2014-01-22 NOTE — Progress Notes (Signed)
INITIAL NUTRITION ASSESSMENT  DOCUMENTATION CODES Per approved criteria  -Non-severe (moderate) malnutrition in the context of chronic illness  Pt meets criteria for MODERATE (NON-SEVERE) MALNUTRITION in the context of CHRONIC ILLNESS as evidenced by moderate loss of subcutaneous fat and moderate muscle wasting per physical exam.  INTERVENTION: Provide Boost plus once daily Encourage PO intake; healthful diet  NUTRITION DIAGNOSIS: Predicted sub optimal energy intake related to varied appetite as evidenced by reported weight loss and moderate muscle wasting.   Goal: Pt to meet >/= 90% of their estimated nutrition needs   Monitor:  PO intake, weight trend, labs, I/O's  Reason for Assessment: Malnutrition Screening Tool, score of 2  80 y.o. female  Admitting Dx: Acute on chronic diastolic congestive heart failure  ASSESSMENT: 80 yo female with PMH of dCHF, sick sinus syndrome s/p PPM, HTN, A fib (no A/C due to GI bleed/high fall risk), OA, DM2. She presents with CC of lower extremity edema.   Pt reports losing weight but, also reports usual body weight of 120 lbs. She states that her appetite varies; she eats every day but, some days she will skip a meal. She reports that she used to drink Ensure a long time ago. Per nursing notes, pt ate 100% of breakfast this morning and 100% of dinner last night.  Briefly reviewed heart healthy diet; encouraged low sodium and no salt; encouraged lean protein with each meal as well as fruits and vegetables. Encouraged pt to avoid skipping meals; suggested snacking or drinking Ensure if she skips a meal.  Labs reviewed. Low potassium, decreased GFR  Nutrition Focused Physical Exam:  Subcutaneous Fat:  Orbital Region: mild wasting Upper Arm Region: moderate wasting Thoracic and Lumbar Region: NA  Muscle:  Temple Region: moderate wasting Clavicle Bone Region: moderate wasting Clavicle and Acromion Bone Region: moderate wasting Scapular Bone  Region: NA Dorsal Hand: moderate wasting Patellar Region: moderate wasting Anterior Thigh Region: moderate wasting Posterior Calf Region: wnl; edema  Edema: +3 RLE and LLE edema  Height: Ht Readings from Last 1 Encounters:  01/21/14 5\' 2"  (1.575 m)    Weight: Wt Readings from Last 1 Encounters:  01/22/14 122 lb 12.8 oz (55.702 kg)    Ideal Body Weight: 110 lbs  % Ideal Body Weight: 111%  Wt Readings from Last 10 Encounters:  01/22/14 122 lb 12.8 oz (55.702 kg)  10/16/13 119 lb 1.6 oz (54.023 kg)  09/10/13 117 lb 8 oz (53.298 kg)  07/09/13 126 lb 3.2 oz (57.244 kg)  04/25/13 125 lb (56.7 kg)  04/08/13 130 lb (58.968 kg)  03/05/13 120 lb 4.8 oz (54.568 kg)  01/22/13 120 lb 14.4 oz (54.84 kg)  11/02/12 127 lb (57.607 kg)  11/02/12 127 lb (57.607 kg)    Usual Body Weight: 120 lbs  % Usual Body Weight: 102%  BMI:  Body mass index is 22.45 kg/(m^2).  Estimated Nutritional Needs: Kcal: 6387-5643 Protein: 60-70 grams Fluid: 1.4-1.6 L/day  Skin: intact; +3 RLE and LLE edema  Diet Order: Cardiac  EDUCATION NEEDS: -No education needs identified at this time   Intake/Output Summary (Last 24 hours) at 01/22/14 1214 Last data filed at 01/22/14 0900  Gross per 24 hour  Intake    603 ml  Output   2625 ml  Net  -2022 ml    Last BM: PTA  Labs:   Recent Labs Lab 01/21/14 0942 01/21/14 1622 01/22/14 0014  NA 139  --  138  K 4.7  --  3.5*  CL  102  --  95*  CO2 25  --  29  BUN 11  --  16  CREATININE 1.21* 1.30* 1.35*  CALCIUM 9.4  --  9.6  GLUCOSE 89  --  137*    CBG (last 3)  No results found for this basename: GLUCAP,  in the last 72 hours  Scheduled Meds: . aspirin EC  81 mg Oral Daily  . darifenacin  15 mg Oral Daily  . furosemide  20 mg Intravenous BID  . heparin  5,000 Units Subcutaneous 3 times per day  . lisinopril  40 mg Oral Daily  . metoprolol  100 mg Oral BID  . sodium chloride  3 mL Intravenous Q12H    Continuous Infusions:    Past Medical History  Diagnosis Date  . Weight loss, unintentional 2010-2011    8/08-8/10 138-146 lbs, between 8/10-10/11 lost 18 lbs., work-up negative  . Incontinence overflow, stress female     on vesicare  . History of sick sinus syndrome s/p pacemaker Dr. Lovena Le 2000  . Atrial fibrillation     on coumadin followed by Dr. Elie Confer  . History of vaginal bleeding 05/2005    nagative endometrial biopsy  . Restrictive lung disease 1996    PFT's showed mild disease  . Anemia     Iron deficiency, does not tolerate po  . CHF (congestive heart failure)     diastolic dysfunction  . Hypertension     well controlled on 3 agents  . Osteoporosis   . Allergic rhinitis   . Onychomycosis   . History of trichomonal urethritis   . Constipation   . Diverticulosis of colon (without mention of hemorrhage)   . Pacemaker   . Type II diabetes mellitus     diet controlled  . Stroke 2002    R MCA, cardioembolic  . Arthritis     "all over my body"  . DJD (degenerative joint disease)     Past Surgical History  Procedure Laterality Date  . Cholecystectomy    . Cataract extraction    . Abdominal hysterectomy    . Pacemaker generator change  10/2012    AdaptaL 10/2012 by Dr Lovena Le  . Insert / replace / remove pacemaker  2000    MDT Sigma pacemaker implanted by Dr Lovena Le;    Pryor Ochoa RD, LDN Inpatient Clinical Dietitian Pager: [phone] After Hours Pager: [phone]

## 2014-01-22 NOTE — Progress Notes (Signed)
Subjective: Patient reports SOB and leg swelling improved. Still complains of bilateral LE tenderness with palpation or ambulation.   Objective: Vital signs in last 24 hours: Filed Vitals:   01/21/14 2113 01/22/14 0129 01/22/14 0435 01/22/14 0959  BP: 122/77 114/55 137/66 127/54  Pulse: 99 80 70 75  Temp: 97.9 F (36.6 C) 98.2 F (36.8 C) 98.1 F (36.7 C) 97.5 F (36.4 C)  TempSrc: Oral Oral Oral Oral  Resp: 18 18 16 18   Height:      Weight:   55.702 kg (122 lb 12.8 oz)   SpO2: 99% 97% 99% 97%   Weight change: -7lbs since yesterday  Intake/Output Summary (Last 24 hours) at 01/22/14 1003 Last data filed at 01/22/14 0900  Gross per 24 hour  Intake    603 ml  Output   3575 ml  Net  -2972 ml   General appearance: alert and cooperative  Head: Normocephalic, without obvious abnormality, atraumatic  Eyes: conjunctivae w/ dark discoloration. PERRL, EOM's intact.  Neck: JVD to angle of mandible at 45 degrees, no carotid bruit, tortuous neck veins  Lungs: few crackles at lung bases bilaterally, decreased breath sounds in all lung fields Heart: RRR, no S3 or S4 Abdomen: soft, non-tender; bowel sounds normal  Extremities: extremities normal, atraumatic, no cyanosis, 1+ pitting edema to mid-calf bilaterally, L>R  Pulses: 2+ and symmetric  Skin: Skin color, texture, turgor normal. No rashes or lesions  Neurologic: Grossly normal  Lab Results: Results for orders placed during the hospital encounter of 01/21/14 (from the past 24 hour(s))  URINALYSIS, ROUTINE W REFLEX MICROSCOPIC     Status: Abnormal   Collection Time    01/21/14 10:29 AM      Result Value Ref Range   Color, Urine YELLOW  YELLOW   APPearance CLOUDY (*) CLEAR   Specific Gravity, Urine 1.006  1.005 - 1.030   pH 7.0  5.0 - 8.0   Glucose, UA NEGATIVE  NEGATIVE mg/dL   Hgb urine dipstick SMALL (*) NEGATIVE   Bilirubin Urine NEGATIVE  NEGATIVE   Ketones, ur NEGATIVE  NEGATIVE mg/dL   Protein, ur NEGATIVE  NEGATIVE  mg/dL   Urobilinogen, UA 1.0  0.0 - 1.0 mg/dL   Nitrite NEGATIVE  NEGATIVE   Leukocytes, UA SMALL (*) NEGATIVE  URINE MICROSCOPIC-ADD ON     Status: None   Collection Time    01/21/14 10:29 AM      Result Value Ref Range   Squamous Epithelial / LPF RARE  RARE   WBC, UA 0-2  <3 WBC/hpf   RBC / HPF 0-2  <3 RBC/hpf   Bacteria, UA RARE  RARE  CBC     Status: Abnormal   Collection Time    01/21/14  4:22 PM      Result Value Ref Range   WBC 4.1  4.0 - 10.5 K/uL   RBC 3.66 (*) 3.87 - 5.11 MIL/uL   Hemoglobin 11.3 (*) 12.0 - 15.0 g/dL   HCT 33.7 (*) 36.0 - 46.0 %   MCV 92.1  78.0 - 100.0 fL   MCH 30.9  26.0 - 34.0 pg   MCHC 33.5  30.0 - 36.0 g/dL   RDW 13.9  11.5 - 15.5 %   Platelets 157  150 - 400 K/uL  CREATININE, SERUM     Status: Abnormal   Collection Time    01/21/14  4:22 PM      Result Value Ref Range   Creatinine, Ser 1.30 (*) 0.50 -  1.10 mg/dL   GFR calc non Af Amer 36 (*) >90 mL/min   GFR calc Af Amer 42 (*) >90 mL/min  TROPONIN I     Status: None   Collection Time    01/21/14  9:20 PM      Result Value Ref Range   Troponin I <0.30  <0.30 ng/mL  BASIC METABOLIC PANEL     Status: Abnormal   Collection Time    01/22/14 12:14 AM      Result Value Ref Range   Sodium 138  137 - 147 mEq/L   Potassium 3.5 (*) 3.7 - 5.3 mEq/L   Chloride 95 (*) 96 - 112 mEq/L   CO2 29  19 - 32 mEq/L   Glucose, Bld 137 (*) 70 - 99 mg/dL   BUN 16  6 - 23 mg/dL   Creatinine, Ser 1.35 (*) 0.50 - 1.10 mg/dL   Calcium 9.6  8.4 - 10.5 mg/dL   GFR calc non Af Amer 34 (*) >90 mL/min   GFR calc Af Amer 40 (*) >90 mL/min   Anion gap 14  5 - 15  CBC     Status: Abnormal   Collection Time    01/22/14 12:14 AM      Result Value Ref Range   WBC 4.8  4.0 - 10.5 K/uL   RBC 3.67 (*) 3.87 - 5.11 MIL/uL   Hemoglobin 11.8 (*) 12.0 - 15.0 g/dL   HCT 34.5 (*) 36.0 - 46.0 %   MCV 94.0  78.0 - 100.0 fL   MCH 32.2  26.0 - 34.0 pg   MCHC 34.2  30.0 - 36.0 g/dL   RDW 13.9  11.5 - 15.5 %   Platelets 154   150 - 400 K/uL   Studies/Results: Dg Chest 2 View  01/21/2014   CLINICAL DATA:  Shortness of breath, bilateral leg swelling  EXAM: CHEST  2 VIEW  COMPARISON:  09/04/2013  FINDINGS: Cardiomegaly is noted. Single lead cardiac pacemaker is unchanged in position. Central mild vascular congestion and mild perihilar interstitial prominence without convincing pulmonary edema. No segmental infiltrate. Thoracic spine osteopenia.  IMPRESSION: Cardiomegaly. Central mild vascular congestion and mild perihilar interstitial prominence without convincing pulmonary edema. No segmental infiltrate   Electronically Signed   By: Lahoma Crocker M.D.   On: 01/21/2014 09:38   Medications: I have reviewed the patient's current medications. Scheduled Meds: . aspirin EC  81 mg Oral Daily  . darifenacin  15 mg Oral Daily  . furosemide  20 mg Intravenous BID  . heparin  5,000 Units Subcutaneous 3 times per day  . lisinopril  40 mg Oral Daily  . metoprolol  100 mg Oral BID  . potassium chloride  40 mEq Oral Once  . sodium chloride  3 mL Intravenous Q12H   Continuous Infusions:  PRN Meds:.acetaminophen, acetaminophen Assessment/Plan: Principal Problem:   Acute on chronic diastolic congestive heart failure Active Problems:   Hypertension   Atrial fibrillation with RVR   Incontinence overflow, stress female   GERD (gastroesophageal reflux disease)   Normocytic anemia   CKD (chronic kidney disease) stage 3, GFR 30-59 ml/min  1. Acute on chronic diastolic CHF: Last ECHO in Jan 2014 showed LV EF: 50% - 55%. Cr stable today at 1.35 -Continue Lasix, 20mg  IV BID  -Strict I&O, daily weights  -BMP tomorrow morning   2. Asymmetric leg swelling, L>R: left leg more swollen than right after diuresis with tenderness bilaterally  -check bilateral LE dopplers to rule  out PE  3. Atrial fibrillation with RVR: Patient admitted w/ afib and tachycardia in 130's; HR now down to 70 and NSR. She was previously been on Coumadin for  anti-coagulation, however it was stopped after she was found to have symptomatic anemia in the setting of a sub-acute GI Bleed. Is currently on aspirin 81mg  daily and metoprolol 100mg  daily. Follows w/ Dr. Lovena Le, has PPM for sick sinus syndrome as well.  -Cardiac monitoring  -Continue metoprolol 100mg  daily  -Continue aspirin 81mg  daily   4. Pancytopenia: Hgb 11.8 today. WBC 3.9 Plt 143. Anemia panel 1/14 showed normocytic anemia, low iron w/ normal ferritin, folate, and B12, haptoglobin.  -repeat anemia panel pending -check HIV antibody  5. HTN: normotensive on admission. On Lisinopril 40mg  daily, metoprolol 100mg  daily  -Continue home meds   6. Urinary Incontinence, stress- On Darifenacin 15mg  at home  -Continue Darifenacin   7. DM type II- Controlled w/ diet alone  -heart healthy diet   8. CKD stage 3: Cr 1.21 (baseline around 1.0)  -monitor creatinine, BMP tomorrow morning   This is a Careers information officer Note.  The care of the patient was discussed with Dr. Heber [place] and the assessment and plan formulated with their assistance.  Please see their attached note for official documentation of the daily encounter.   LOS: 1 day   Ciro Backer, Med Student 01/22/2014, 10:03 AM

## 2014-01-22 NOTE — Care Management Note (Addendum)
    Page 1 of 1   01/23/2014     2:19:32 PM CARE MANAGEMENT NOTE 01/23/2014  Patient:  AAMIRA, BISCHOFF   Account Number:  [id number]  Date Initiated:  01/22/2014  Documentation initiated by:  [facility]  Subjective/Objective Assessment:   80 yo female with PMH of dCHF, sick sinus syndrome s/p PPM, HTN, A fib (no A/C due to GI bleed/high fall risk), OA, DM2.  She presents with CC of lower extremity edema. //Home alone.     Action/Plan:   -Continue Lasix, 40mg  IV BID  -Strict I&O, daily weights  -BMP tomorrow morning//Access for disposition needs.   Anticipated DC Date:  01/27/2014   Anticipated DC Plan:  [place]  CM consult  Patient refused services      Choice offered to / List presented to:             Status of service:  In process, will continue to follow Medicare Important Message given?   (If response is "NO", the following Medicare IM given date fields will be blank) Date Medicare IM given:   Medicare IM given by:   Date Additional Medicare IM given:   Additional Medicare IM given by:    Discharge Disposition:    Per UR Regulation:  Reviewed for med. necessity/level of care/duration of stay  If discussed at [place] of Stay Meetings, dates discussed:    Comments:  01/23/14 Fuller Mandril, RN, BSN, NCM [phone] I have recommended that this patient have [place] but she declines at this time. I have discussed the benefits of this service with her. The patient verbalizes understanding.

## 2014-01-22 NOTE — Progress Notes (Signed)
   I have seen the patient and reviewed the daily progress note by Jake Bathe MS 4 and discussed the care of the patient with them.  See below for documentation of my findings, assessment, and plans.  Subjective: Reports she is feeling much better this morning. Notes improvement in her lower extremity swelling. Objective: Vital signs in last 24 hours: Filed Vitals:   01/21/14 2113 01/22/14 0129 01/22/14 0435 01/22/14 0959  BP: 122/77 114/55 137/66 127/54  Pulse: 99 80 70 75  Temp: 97.9 F (36.6 C) 98.2 F (36.8 C) 98.1 F (36.7 C) 97.5 F (36.4 C)  TempSrc: Oral Oral Oral Oral  Resp: 18 18 16 18   Height:      Weight:   122 lb 12.8 oz (55.702 kg)   SpO2: 99% 97% 99% 97%   Weight change:   Intake/Output Summary (Last 24 hours) at 01/22/14 1300 Last data filed at 01/22/14 0900  Gross per 24 hour  Intake    603 ml  Output   2275 ml  Net  -1672 ml   General: resting in bed HEENT: PERRL, EOMI, no scleral icterus Cardiac: RRR, no rubs, murmurs or gallops Pulm: mild LLL rales Abd: soft, nontender, nondistended, BS present Ext: warm and well perfused, 1+ edema to knee bilaterally however circumference of left leg larger than right Neuro: alert and oriented X3 Lab Results: Reviewed and documented in Electronic Record Micro Results: Reviewed and documented in Electronic Record Studies/Results: Reviewed and documented in Electronic Record Medications: I have reviewed the patient's current medications. Scheduled Meds: . aspirin EC  81 mg Oral Daily  . darifenacin  15 mg Oral Daily  . furosemide  20 mg Intravenous BID  . heparin  5,000 Units Subcutaneous 3 times per day  . lisinopril  40 mg Oral Daily  . metoprolol  100 mg Oral BID  . sodium chloride  3 mL Intravenous Q12H   Continuous Infusions:  PRN Meds:.acetaminophen, acetaminophen Assessment/Plan:   Acute on chronic diastolic congestive heart failure - Good UOP with 40 IV lasix BID net neg -2.8L yesterday, weight  documented to decrease 7 lbs -  SCr stable - Decrease Lasix to 20mg  IV BID - 40 Kdur today  Asymmetric swelling of legs - U/S duplex to rule out DVT    Hypertension - Well controllled    Atrial fibrillation - Rate controlled with Metoprolol - Not candidate for A/C due to Hx of GI bleed and fall risk    Incontinence overflow, stress female -Continue Enablex    GERD (gastroesophageal reflux disease)   Normocytic anemia - Ferritin improved to 52, B12 improved to 176, folic acid wnl    CKD (chronic kidney disease) stage 3, GFR 30-59 ml/min -Scr stable    Malnutrition of moderate degree   Dispo: Disposition is deferred at this time, awaiting improvement of current medical problems.  Anticipated discharge in approximately 1 day(s).   The patient does have a current PCP Sid Falcon, MD) and does need an [facility] hospital follow-up appointment after discharge.  The patient does not have transportation limitations that hinder transportation to clinic appointments.  .Services Needed at time of discharge: Y = Yes, Blank = No PT:   OT:   RN:   Equipment:   Other:     LOS: 1 day   Lucious Groves, DO 01/22/2014, 1:00 PM

## 2014-01-22 NOTE — H&P (Signed)
  I have seen and examined the patient myself, and I have reviewed the note by Jake Bathe, MS 4 and was present during the interview and physical exam.  Please see my separate H&P for additional findings, assessment, and plan.   Signed: Lucious Groves, DO 01/22/2014, 7:54 AM

## 2014-01-23 DIAGNOSIS — M7989 Other specified soft tissue disorders: Secondary | ICD-10-CM

## 2014-01-23 DIAGNOSIS — E876 Hypokalemia: Secondary | ICD-10-CM

## 2014-01-23 DIAGNOSIS — R600 Localized edema: Secondary | ICD-10-CM

## 2014-01-23 DIAGNOSIS — E440 Moderate protein-calorie malnutrition: Secondary | ICD-10-CM

## 2014-01-23 LAB — BASIC METABOLIC PANEL
ANION GAP: 12 (ref 5–15)
BUN: 15 mg/dL (ref 6–23)
CO2: 31 meq/L (ref 19–32)
CREATININE: 1.37 mg/dL — AB (ref 0.50–1.10)
Calcium: 9.6 mg/dL (ref 8.4–10.5)
Chloride: 96 mEq/L (ref 96–112)
GFR calc Af Amer: 39 mL/min — ABNORMAL LOW (ref >90)
GFR calc non Af Amer: 34 mL/min — ABNORMAL LOW (ref >90)
Glucose, Bld: 113 mg/dL — ABNORMAL HIGH (ref 70–99)
Potassium: 3.5 mEq/L — ABNORMAL LOW (ref 3.7–5.3)
Sodium: 139 mEq/L (ref 137–147)

## 2014-01-23 LAB — HIV ANTIBODY (ROUTINE TESTING W REFLEX): HIV 1&2 Ab, 4th Generation: NONREACTIVE

## 2014-01-23 MED ORDER — FUROSEMIDE 20 MG PO TABS
20.0000 mg | ORAL_TABLET | Freq: Two times a day (BID) | ORAL | Status: DC
  Administered 2014-01-23 – 2014-01-24 (×3): 20 mg via ORAL
  Filled 2014-01-23 (×5): qty 1

## 2014-01-23 MED ORDER — POTASSIUM CHLORIDE CRYS ER 10 MEQ PO TBCR
40.0000 meq | EXTENDED_RELEASE_TABLET | Freq: Once | ORAL | Status: AC
  Administered 2014-01-23: 40 meq via ORAL
  Filled 2014-01-23: qty 4

## 2014-01-23 MED ORDER — POTASSIUM CHLORIDE CRYS ER 20 MEQ PO TBCR
40.0000 meq | EXTENDED_RELEASE_TABLET | Freq: Two times a day (BID) | ORAL | Status: DC
  Administered 2014-01-23: 40 meq via ORAL
  Filled 2014-01-23: qty 2

## 2014-01-23 NOTE — Progress Notes (Signed)
Subjective: Patient reports SOB and leg swelling improved. Now complains of unilateral tenderness in L leg with palpation or ambulation (was previously complaining of bilateral tenderness). Doppler studies still pending.  Objective: Vital signs in last 24 hours: Filed Vitals:   01/22/14 1842 01/22/14 2051 01/23/14 0651 01/23/14 0804  BP: 120/63 118/59 114/73 125/63  Pulse:  73 70 94  Temp:  98.3 F (36.8 C) 98.2 F (36.8 C) 98.2 F (36.8 C)  TempSrc:  Oral Oral Oral  Resp:  18 16 18   Height:      Weight:   53.7 kg (118 lb 6.2 oz)   SpO2:  100% 96% 99%   Weight change: -5.2 kg (-11 lb 7.4 oz)-4lbs since yesterday   Intake/Output Summary (Last 24 hours) at 01/23/14 0905 Last data filed at 01/23/14 0800  Gross per 24 hour  Intake    540 ml  Output   1875 ml  Net  -1335 ml   General appearance: alert and cooperative  Head: Normocephalic, without obvious abnormality, atraumatic  Eyes: conjunctivae w/ dark discoloration. PERRL, EOM's intact.  Neck: no carotid bruit, tortuous neck veins  Lungs:decreased breath sounds in all lung fields, no crackles or rhonchi Heart: RRR, no S3 or S4 Abdomen: soft, non-tender; bowel sounds normal  Extremities: extremities normal, atraumatic, no cyanosis, L calf circumference > R, trace ankle edema    Pulses: 2+ and symmetric  Skin: Skin color, texture, turgor normal. No rashes or lesions  Neurologic: Grossly normal  Lab Results: Results for orders placed during the hospital encounter of 01/21/14 (from the past 24 hour(s))  BASIC METABOLIC PANEL     Status: Abnormal   Collection Time    01/23/14  5:28 AM      Result Value Ref Range   Sodium 139  137 - 147 mEq/L   Potassium 3.5 (*) 3.7 - 5.3 mEq/L   Chloride 96  96 - 112 mEq/L   CO2 31  19 - 32 mEq/L   Glucose, Bld 113 (*) 70 - 99 mg/dL   BUN 15  6 - 23 mg/dL   Creatinine, Ser 1.37 (*) 0.50 - 1.10 mg/dL   Calcium 9.6  8.4 - 10.5 mg/dL   GFR calc non Af Amer 34 (*) >90 mL/min   GFR calc  Af Amer 39 (*) >90 mL/min   Anion gap 12  5 - 15   Studies/Results: Dg Chest 2 View  01/21/2014   CLINICAL DATA:  Shortness of breath, bilateral leg swelling  EXAM: CHEST  2 VIEW  COMPARISON:  09/04/2013  FINDINGS: Cardiomegaly is noted. Single lead cardiac pacemaker is unchanged in position. Central mild vascular congestion and mild perihilar interstitial prominence without convincing pulmonary edema. No segmental infiltrate. Thoracic spine osteopenia.  IMPRESSION: Cardiomegaly. Central mild vascular congestion and mild perihilar interstitial prominence without convincing pulmonary edema. No segmental infiltrate   Electronically Signed   By: Lahoma Crocker M.D.   On: 01/21/2014 09:38   Medications: I have reviewed the patient's current medications. Scheduled Meds: . aspirin EC  81 mg Oral Daily  . darifenacin  15 mg Oral Daily  . furosemide  20 mg Oral BID  . heparin  5,000 Units Subcutaneous 3 times per day  . lisinopril  40 mg Oral Daily  . metoprolol  100 mg Oral BID  . potassium chloride  40 mEq Oral BID  . sodium chloride  3 mL Intravenous Q12H   Continuous Infusions:  PRN Meds:.acetaminophen, acetaminophen Assessment/Plan: Principal Problem:   Acute  on chronic diastolic congestive heart failure Active Problems:   Hypertension   Atrial fibrillation with RVR   Incontinence overflow, stress female   GERD (gastroesophageal reflux disease)   Normocytic anemia   CKD (chronic kidney disease) stage 3, GFR 30-59 ml/min   Malnutrition of moderate degree  1. Acute on chronic diastolic CHF: Last ECHO in Jan 2014 showed LV EF: 50% - 55%. Cr stable today at 1.37. -11lbs since admission -Switch to PO Lasix 20mg  BID -Strict I&O, daily weights  -BMP tomorrow morning   2. Asymmetric leg swelling, L>R: left leg more swollen than right after diuresis with tenderness in L leg -bilateral LE dopplers to rule out PE pending  3. Atrial fibrillation with RVR: Patient admitted w/ afib and tachycardia  in 130's; HR now down to 70 and NSR. She was previously been on Coumadin for anti-coagulation, however it was stopped after she was found to have symptomatic anemia in the setting of a sub-acute GI Bleed. Is currently on aspirin 81mg  daily and metoprolol 100mg  daily. Follows w/ Dr. Lovena Le, has PPM for sick sinus syndrome as well.  -Cardiac monitoring  -Continue metoprolol 100mg  daily  -Continue aspirin 81mg  daily   4. Pancytopenia: Hgb 11.8 today. WBC 3.9 Plt 143. Anemia panel shows normocytic anemia, low iron w/ normal ferritin, folate, and B12. -HIV antibody pending -check SPEP  5. HTN: normotensive on admission. On Lisinopril 40mg  daily, metoprolol 100mg  daily  -Continue home meds   6. Urinary Incontinence, stress- On Darifenacin 15mg  at home  -Continue Darifenacin   7. DM type II- Controlled w/ diet alone  -heart healthy diet   8. CKD stage 3: Cr 1.37 (baseline around 1.3)  -monitor creatinine, BMP tomorrow morning   This is a Careers information officer Note.  The care of the patient was discussed with Dr. Heber [place] and the assessment and plan formulated with their assistance.  Please see their attached note for official documentation of the daily encounter.   LOS: 2 days   Ciro Backer, Med Student 01/23/2014, 9:05 AM

## 2014-01-23 NOTE — Progress Notes (Signed)
VASCULAR LAB PRELIMINARY  PRELIMINARY  PRELIMINARY  PRELIMINARY  Bilateral lower extremity venous Dopplers completed.    Preliminary report:  There is no DVT or SVT noted in the bilateral lower extremities.   Malonie Tatum, RVT 01/23/2014, 9:31 AM

## 2014-01-23 NOTE — Clinical Documentation Improvement (Signed)
Presents with Acute on Chronic Diastolic CHF, Hypertension, CKD3.  There is an automatic link between Hypertension and CKD; no link with CHF.   Presents with SOB  Has CKD3 and Acute on Chronic Diastolic CHF  Pulmonary Hypertension is documented  CXR reveals Cardiomegaly  Being treated with Lisinopril and Metoprolol  Please clarify if you feel the patient has : Hypertensive Heart Disease with CHF       Hypertensive CKD3 with CHF       Hypertensive Heart and CKD3 with CHF       Other Condition  Thank You, Zoila Shutter ,RN Clinical Documentation Specialist:  [facility] [employer] [facility]

## 2014-01-23 NOTE — Progress Notes (Signed)
Attending physician progress note: I personally examined this patient today together with resident physician Dr. Johnnette Gourd and fourth year medical student Ms. Jake Bathe and I concur with her assessment and management plan. The patient continues to do well. She has no respiratory distress at rest. She has had a nice diuresis of over 5 L so far with parenteral diuretics and will now be transitioned back to oral diuretics. No signs for acute myocardial injury. We are obtaining additional studies to better delineate her chronic anemia. Venous Doppler studies of the lower extremities showed no evidence for acute thrombosis. She remains on prophylactic dose unfractionated heparin to prevent clots. Anticipate discharge soon.  [name], M.D., [place]

## 2014-01-23 NOTE — Progress Notes (Signed)
Subjective: Feeling much better, reports eating well, is please with improvement of lower extremity swelling.  No complaints Objective: Vital signs in last 24 hours: Filed Vitals:   01/22/14 1842 01/22/14 2051 01/23/14 0651 01/23/14 0804  BP: 120/63 118/59 114/73 125/63  Pulse:  73 70 94  Temp:  98.3 F (36.8 C) 98.2 F (36.8 C) 98.2 F (36.8 C)  TempSrc:  Oral Oral Oral  Resp:  18 16 18   Height:      Weight:   118 lb 6.2 oz (53.7 kg)   SpO2:  100% 96% 99%   Weight change: -11 lb 7.4 oz (-5.2 kg)  Intake/Output Summary (Last 24 hours) at 01/23/14 0903 Last data filed at 01/23/14 0800  Gross per 24 hour  Intake    540 ml  Output   1875 ml  Net  -1335 ml   General: resting in bed Cardiac: irregularly irregular, 2/6 systolic murmur Pulm: clear to auscultation bilaterally, moving normal volumes of air Abd: soft, nontender, nondistended, BS present Ext: warm and well perfused, 1-2+ pitting edema of b/l lower extremity with LLL larger calf circumference  Neuro: alert and oriented X3, cranial nerves II-XII grossly intact  Lab Results: Basic Metabolic Panel:  Recent Labs Lab 01/22/14 0014 01/23/14 0528  NA 138 139  K 3.5* 3.5*  CL 95* 96  CO2 29 31  GLUCOSE 137* 113*  BUN 16 15  CREATININE 1.35* 1.37*  CALCIUM 9.6 9.6   Liver Function Tests:  Recent Labs Lab 01/21/14 0942  AST 25  ALT 9  ALKPHOS 125*  BILITOT 1.3*  PROT 7.5  ALBUMIN 3.7   No results found for this basename: LIPASE, AMYLASE,  in the last 168 hours No results found for this basename: AMMONIA,  in the last 168 hours CBC:  Recent Labs Lab 01/21/14 0942 01/21/14 1622 01/22/14 0014  WBC 3.8* 4.1 4.8  NEUTROABS 1.7  --   --   HGB 11.2* 11.3* 11.8*  HCT 32.7* 33.7* 34.5*  MCV 91.9 92.1 94.0  PLT 143* 157 154   Cardiac Enzymes:  Recent Labs Lab 01/21/14 0942 01/21/14 2120  TROPONINI <0.30 <0.30   BNP:  Recent Labs Lab 01/21/14 0942  PROBNP 10317.0*   D-Dimer: No results  found for this basename: DDIMER,  in the last 168 hours CBG: No results found for this basename: GLUCAP,  in the last 168 hours Hemoglobin A1C: No results found for this basename: HGBA1C,  in the last 168 hours Fasting Lipid Panel: No results found for this basename: CHOL, HDL, LDLCALC, TRIG, CHOLHDL, LDLDIRECT,  in the last 168 hours Thyroid Function Tests: No results found for this basename: TSH, T4TOTAL, FREET4, T3FREE, THYROIDAB,  in the last 168 hours Coagulation: No results found for this basename: LABPROT, INR,  in the last 168 hours Anemia Panel:  Recent Labs Lab 01/21/14 0014  VITAMINB12 877  FOLATE 13.9  FERRITIN 52  TIBC 307  IRON 38*  RETICCTPCT 1.5   Urine Drug Screen: Drugs of Abuse  No results found for this basename: labopia, cocainscrnur, labbenz, amphetmu, thcu, labbarb    Alcohol Level: No results found for this basename: ETH,  in the last 168 hours Urinalysis:  Recent Labs Lab 01/21/14 1029  COLORURINE YELLOW  LABSPEC 1.006  PHURINE 7.0  GLUCOSEU NEGATIVE  HGBUR SMALL*  BILIRUBINUR NEGATIVE  KETONESUR NEGATIVE  PROTEINUR NEGATIVE  UROBILINOGEN 1.0  NITRITE NEGATIVE  LEUKOCYTESUR SMALL*    Micro Results: No results found for this or any  previous visit (from the past 240 hour(s)). Studies/Results: Dg Chest 2 View  01/21/2014   CLINICAL DATA:  Shortness of breath, bilateral leg swelling  EXAM: CHEST  2 VIEW  COMPARISON:  09/04/2013  FINDINGS: Cardiomegaly is noted. Single lead cardiac pacemaker is unchanged in position. Central mild vascular congestion and mild perihilar interstitial prominence without convincing pulmonary edema. No segmental infiltrate. Thoracic spine osteopenia.  IMPRESSION: Cardiomegaly. Central mild vascular congestion and mild perihilar interstitial prominence without convincing pulmonary edema. No segmental infiltrate   Electronically Signed   By: Lahoma Crocker M.D.   On: 01/21/2014 09:38   Medications: I have reviewed the  patient's current medications. Scheduled Meds: . aspirin EC  81 mg Oral Daily  . darifenacin  15 mg Oral Daily  . furosemide  20 mg Oral BID  . heparin  5,000 Units Subcutaneous 3 times per day  . lisinopril  40 mg Oral Daily  . metoprolol  100 mg Oral BID  . potassium chloride  40 mEq Oral BID  . sodium chloride  3 mL Intravenous Q12H   Continuous Infusions:  PRN Meds:.acetaminophen, acetaminophen Assessment/Plan: Acute on chronic diastolic congestive heart failure  - Continued with good UOP with Lasix 37mb IV BID, now down 11 lbs since admission - Will transition back to home dose of Lasix 20mg  PO BID and monitor overnight to see her response  Hypokalemia - KDur 65mEq x 2  Asymmetric swelling of legs  - U/S duplex to rule out DVT (not done yet)  Hypertension  - Well controllled  Atrial fibrillation  - Rate controlled with Metoprolol  - Not candidate for A/C due to Hx of GI bleed and fall risk   Incontinence overflow, stress female  -Continue Enablex   Normocytic anemia  - Ferritin improved to 52, B12 improved to 202, folic acid wnl  - Hgb appears to be at her baseline, likely Anemia of chronic disease - Will check SPEP to eval for MM.  CKD (chronic kidney disease) stage 3, GFR 30-59 ml/min  -Scr stable   Malnutrition of moderate degree - Boost once daily  Dispo: Need to rule out LE doppler with monitor overnight on home diuretics with plan for discharge tomorrow.  The patient does have a current PCP Sid Falcon, MD) and does need an [facility] hospital follow-up appointment after discharge.  The patient does not have transportation limitations that hinder transportation to clinic appointments.  .Services Needed at time of discharge: Y = Yes, Blank = No PT:   OT:   RN:   Equipment:   Other:     LOS: 2 days   Lucious Groves, DO 01/23/2014, 9:03 AM

## 2014-01-23 NOTE — Plan of Care (Signed)
Problem: Phase I Progression Outcomes Goal: Hemodynamically stable Outcome: Progressing No acute events occurred overnight.  Continue to monitor I&Os and daily weights.

## 2014-01-24 DIAGNOSIS — E46 Unspecified protein-calorie malnutrition: Secondary | ICD-10-CM

## 2014-01-24 DIAGNOSIS — N39490 Overflow incontinence: Secondary | ICD-10-CM

## 2014-01-24 DIAGNOSIS — I10 Essential (primary) hypertension: Secondary | ICD-10-CM

## 2014-01-24 LAB — BASIC METABOLIC PANEL
Anion gap: 15 (ref 5–15)
BUN: 14 mg/dL (ref 6–23)
CALCIUM: 9.4 mg/dL (ref 8.4–10.5)
CO2: 30 mEq/L (ref 19–32)
CREATININE: 1.38 mg/dL — AB (ref 0.50–1.10)
Chloride: 96 mEq/L (ref 96–112)
GFR calc Af Amer: 39 mL/min — ABNORMAL LOW (ref >90)
GFR calc non Af Amer: 34 mL/min — ABNORMAL LOW (ref >90)
GLUCOSE: 111 mg/dL — AB (ref 70–99)
Potassium: 4.2 mEq/L (ref 3.7–5.3)
SODIUM: 141 meq/L (ref 137–147)

## 2014-01-24 MED ORDER — POTASSIUM CHLORIDE ER 10 MEQ PO CPCR: 40.0000 meq | ORAL_CAPSULE | Freq: Two times a day (BID) | ORAL | Status: DC

## 2014-01-24 MED ORDER — POTASSIUM CHLORIDE CRYS ER 10 MEQ PO TBCR: 40.0000 meq | EXTENDED_RELEASE_TABLET | Freq: Every day | ORAL | Status: DC

## 2014-01-24 MED ORDER — LISINOPRIL 20 MG PO TABS: 20.0000 mg | ORAL_TABLET | Freq: Every day | ORAL | Status: DC

## 2014-01-24 MED ORDER — POTASSIUM CHLORIDE ER 10 MEQ PO CPCR: 40.0000 meq | ORAL_CAPSULE | Freq: Every day | ORAL | Status: DC

## 2014-01-24 NOTE — Progress Notes (Signed)
D/C'd IV.  D/C'd tele.  D/C'd pt.  Explained discharge instructions to pt.  Explained prescribed medications to pt and when next dose was to be given.  Pt had no further questions.  Pt in no acute distress.

## 2014-01-24 NOTE — Discharge Summary (Signed)
Medicine attending discharge note: I personally interviewed and examined this patient on the day of discharge. I attest to the accuracy of the evaluation and discharge plan as recorded by resident physician Dr. Hayes Ludwig and fourth year  medical student Ms. Jake Bathe. Very sweet 80 year old woman admitted with acute on chronic congestive heart failure. She responded very nicely to parenteral diuretics. No acute myocardial injury detected. Additional problems identified included asymmetric edema left calf greater than right. Venous Doppler study did not show any evidence for thrombosis. She has a chronic normochromic anemia likely related to her chronic renal insufficiency with GFR approximately 40 mL per minute. We did obtain a serum protein electrophoresis to screen for possibility of underlying monoclonal gammopathy. There were no complications. She was discharged in stable condition and will followup in our internal medicine clinic.  Murriel Hopper, M.D., [place]

## 2014-01-24 NOTE — Progress Notes (Signed)
Subjective: Patient reports SOB and leg swelling resovled. Continues to complain of unilateral tenderness in L leg with palpation or ambulation. Dopplers negative.   Objective: Vital signs in last 24 hours: Filed Vitals:   01/23/14 2143 01/23/14 2208 01/24/14 0448 01/24/14 0950  BP: 103/50 108/60 119/56 102/46  Pulse: 61 71 69 61  Temp: 97.6 F (36.4 C)  98.1 F (36.7 C)   TempSrc: Oral  Oral   Resp: 16  16   Height:      Weight:   53.797 kg (118 lb 9.6 oz)   SpO2: 100%  96%    Weight change: 0.097 kg (3.4 oz)-11lbs since admission   Intake/Output Summary (Last 24 hours) at 01/24/14 1058 Last data filed at 01/24/14 1019  Gross per 24 hour  Intake    720 ml  Output   1575 ml  Net   -855 ml   General appearance: alert and cooperative  Head: Normocephalic, without obvious abnormality, atraumatic  Eyes: conjunctivae w/ dark discoloration. PERRL, EOM's intact.  Neck: no carotid bruit, tortuous neck veins  Lungs:decreased breath sounds in all lung fields, no crackles or rhonchi Heart: RRR, no S3 or S4 Abdomen: soft, non-tender; bowel sounds normal  Extremities: extremities normal, atraumatic, no cyanosis, no edema Pulses: 2+ and symmetric  Skin: Skin color, texture, turgor normal. No rashes or lesions  Neurologic: Grossly normal  Lab Results: Results for orders placed during the hospital encounter of 01/21/14 (from the past 24 hour(s))  BASIC METABOLIC PANEL     Status: Abnormal   Collection Time    01/24/14  4:05 AM      Result Value Ref Range   Sodium 141  137 - 147 mEq/L   Potassium 4.2  3.7 - 5.3 mEq/L   Chloride 96  96 - 112 mEq/L   CO2 30  19 - 32 mEq/L   Glucose, Bld 111 (*) 70 - 99 mg/dL   BUN 14  6 - 23 mg/dL   Creatinine, Ser 1.38 (*) 0.50 - 1.10 mg/dL   Calcium 9.4  8.4 - 10.5 mg/dL   GFR calc non Af Amer 34 (*) >90 mL/min   GFR calc Af Amer 39 (*) >90 mL/min   Anion gap 15  5 - 15   Studies/Results: No results found. Medications: I have reviewed the  patient's current medications. Scheduled Meds: . aspirin EC  81 mg Oral Daily  . darifenacin  15 mg Oral Daily  . furosemide  20 mg Oral BID  . heparin  5,000 Units Subcutaneous 3 times per day  . lisinopril  40 mg Oral Daily  . metoprolol  100 mg Oral BID  . sodium chloride  3 mL Intravenous Q12H   Continuous Infusions:  PRN Meds:.acetaminophen, acetaminophen Assessment/Plan: Principal Problem:   Acute on chronic diastolic congestive heart failure Active Problems:   Hypertension   Atrial fibrillation, controlled   Incontinence overflow, stress female   Normocytic anemia   CKD (chronic kidney disease) stage 3, GFR 30-59 ml/min   Malnutrition of moderate degree   Hypokalemia  1. Acute on chronic diastolic CHF: Last ECHO in Jan 2014 showed LV EF: 50% - 55%. Cr stable today at 1.38. -11lbs since admission -PO Lasix 20mg  BID -Tylenol PRN leg pain  2. Asymmetric leg swelling, L>R: resolved, dopplers negative  3. Atrial fibrillation with RVR: Patient admitted w/ afib and tachycardia in 130's; HR now down to 70 and NSR. She was previously been on Coumadin for anti-coagulation, however it was  stopped after she was found to have symptomatic anemia in the setting of a sub-acute GI Bleed. Is currently on aspirin 81mg  daily and metoprolol 100mg  daily. Follows w/ Dr. Lovena Le, has PPM for sick sinus syndrome as well.  -Continue metoprolol 100mg  daily  -Continue aspirin 81mg  daily   4. Pancytopenia: Hgb 11.8 today. WBC 3.9 Plt 143. Anemia panel shows normocytic anemia, low iron w/ normal ferritin, folate, and B12. -HIV antibody nonreactive -SPEP pending  5. HTN: normotensive on admission. On Lisinopril 40mg  daily, metoprolol 100mg  daily  -Continue home meds   6. Urinary Incontinence, stress- On Darifenacin 15mg  at home  -Continue Darifenacin   7. DM type II- Controlled w/ diet alone  -heart healthy diet   8. CKD stage 3: Cr 1.38 (baseline around 1.3)  -monitor creatinine   This is  a Careers information officer Note.  The care of the patient was discussed with Dr. Heber Edgewood and the assessment and plan formulated with their assistance.  Please see their attached note for official documentation of the daily encounter.   LOS: 3 days   Ciro Backer, Med Student 01/24/2014, 10:58 AM

## 2014-01-24 NOTE — Progress Notes (Signed)
I have seen the patient and reviewed the daily progress note by Jake Bathe MS 4 and discussed the care of the patient with them.  See below for documentation of my findings, assessment, and plans.  Subjective: No overnight events. She reports feeling very well today. She still has left calf tenderness to palpation and with ambulation. Denies CP, SOB, DOE.   Objective: Vital signs in last 24 hours: Filed Vitals:   01/23/14 2208 01/24/14 0448 01/24/14 0950 01/24/14 1404  BP: 108/60 119/56 102/46 101/43  Pulse: 71 69 61 61  Temp:  98.1 F (36.7 C)  97.8 F (36.6 C)  TempSrc:  Oral  Oral  Resp:  16  18  Height:      Weight:  118 lb 9.6 oz (53.797 kg)    SpO2:  96%  100%   Weight change: 3.4 oz (0.097 kg)  Intake/Output Summary (Last 24 hours) at 01/24/14 1417 Last data filed at 01/24/14 1403  Gross per 24 hour  Intake    720 ml  Output   1475 ml  Net   -755 ml   Vitals reviewed. General: resting in bed, in NAD HEENT: no scleral icterus Cardiac: RRR, no rubs, murmurs or gallops Pulm: clear to auscultation bilaterally, no wheezes, rales, or rhonchi Abd: soft, nontender, nondistended, BS present Ext: warm and well perfused, no pedal edema. Mild TTP of left calf but with no asymmetry, no erythema or palpable cord Neuro: alert, follows commands appropriately, moves all extremities voluntarily  Lab Results: Reviewed and documented in Electronic Record Micro Results: Reviewed and documented in Electronic Record Studies/Results: Reviewed and documented in Electronic Record Medications: I have reviewed the patient's current medications. Scheduled Meds: . aspirin EC  81 mg Oral Daily  . darifenacin  15 mg Oral Daily  . furosemide  20 mg Oral BID  . heparin  5,000 Units Subcutaneous 3 times per day  . lisinopril  40 mg Oral Daily  . metoprolol  100 mg Oral BID  . [START ON 01/25/2014] potassium chloride  40 mEq Oral Daily  . sodium chloride  3 mL Intravenous Q12H    Continuous Infusions:  PRN Meds:.acetaminophen, acetaminophen Assessment/Plan: Acute on chronic diastolic congestive heart failure: Weight down by 11 lbs (118lbs today) with net diuresis of ~5L while on Lasix 20mg  IV BID. Weight stable overnight with transition to home lasix 20mg  PO BID yesterday.  -Continue home lasix 20mg  PO BID  -Counseled on low sodium diet   Hypokalemia - K stable at 4.2 today.  - KDur 31mEq daily (4 tablets of 39mEq to make it easier for her to swallow)   Asymmetric swelling of legs with left calf tenderness: Could be 2/2 of fluid shift, Doppler US negative for DVT or Baker's cyst.  -Continue monitoring -Tylenol PRN--this helped ease her pain  Hypertension: BP of 101/43. Was on lisinopril 40mg  daily, metoprolol 100mg  BID at home, and Lasix 20mg  BID.  -Decrease lisinopril to 20mg  daily, will need BP recheck in outpatient setting  Atrial fibrillation: Rate controlled with Metoprolol with HR in 60s. Not candidate for A/C due to Hx of GI bleed and fall risk.  -Continue ASA 81mg  daily -Continue metoprolol 100mg  BID  Incontinence overflow, stress female  -Continue Enablex   Pancytopenia: Hg stable at ~11, WBC of 3.9, plt 143. Likely due to chronic disease. Anemia panel shows normocytic anemia, low iron w/ normal ferritin, folate, and B12. HIV antibody nonreactive  -SPEP pending  CKD (chronic kidney disease) stage 3, GFR  30-59 ml/min: At baseline, with Cr ~1.38.   Moderate malnutrition: Due to chronic illness, with muscle wasting per physical exam.  -Continue Boost Plus once daily  Dispo: Disposition is deferred at this time, awaiting improvement of current medical problems.  Anticipated discharge in approximately 1 day.   The patient does have a current PCP Sid Falcon, MD) and does need an [facility] hospital follow-up appointment after discharge. Case manager to see pt and family this afternoon in regards to [facility] needs.   The patient does not have transportation  limitations that hinder transportation to clinic appointments.  .Services Needed at time of discharge: Y = Yes, Blank = No PT:   OT:   RN:   Equipment:   Other:     LOS: 3 days   Blain Pais, MD PGY-3, IMTS 01/24/2014, 2:17 PM

## 2014-01-24 NOTE — Discharge Summary (Signed)
Name: [name] MRN: [id number] DOB: 1926/01/11 80 y.o. PCP: Sid Falcon, MD  Date of Admission: 01/21/2014  8:58 AM Date of Discharge: 01/24/2014 Attending Physician: Annia Belt, MD  Discharge Diagnosis: Principal Problem:   Acute on chronic diastolic congestive heart failure Active Problems:   Hypertension   Atrial fibrillation, controlled   Incontinence overflow, stress female   Normocytic anemia   CKD (chronic kidney disease) stage 3, GFR 30-59 ml/min   Malnutrition of moderate degree   Hypokalemia  Discharge Medications:   Medication List         aspirin EC 81 MG tablet  Take 81 mg by mouth daily.     diclofenac sodium 1 % Gel  Commonly known as:  VOLTAREN  Apply 2 g topically 4 (four) times daily as needed (for pain).     furosemide 20 MG tablet  Commonly known as:  LASIX  Take 1 tablet (20 mg total) by mouth 2 (two) times daily.     lisinopril 20 MG tablet  Commonly known as:  PRINIVIL,ZESTRIL  Take 1 tablet (20 mg total) by mouth daily.     metoprolol 100 MG tablet  Commonly known as:  LOPRESSOR  Take 1 tablet (100 mg total) by mouth 2 (two) times daily.     potassium chloride 10 MEQ CR capsule  Commonly known as:  MICRO-K  Take 4 capsules (40 mEq total) by mouth daily.     solifenacin 10 MG tablet  Commonly known as:  VESICARE  Take 10 mg by mouth daily.        Disposition and follow-up:   [name] was discharged from [facility] in Good condition.  At the hospital follow up visit please address:  1.  Reassess BP. Her lisinopril was decreased to 20mg  daily from 40mg  daily as her BP was low with SBP in 101-103 range.   2.  Labs / imaging needed at time of follow-up: BMET for K trending (she was discharged with supplementation of 92mEq of potassium daily)  3.  Pending labs/ test needing follow-up: SPEP.   Follow-up Appointments:     Follow-up Information   Follow up with Jessee Avers, MD On  01/31/2014. (2:15pm for hospital follow up)    Specialty:  Internal Medicine   Contact information:   [address] [address] [phone]       Discharge Instructions: Discharge Instructions   Diet - low sodium heart healthy    Complete by:  As directed      Increase activity slowly    Complete by:  As directed            Consultations:  None  Procedures Performed:  Dg Chest 2 View  01/21/2014   CLINICAL DATA:  Shortness of breath, bilateral leg swelling  EXAM: CHEST  2 VIEW  COMPARISON:  09/04/2013  FINDINGS: Cardiomegaly is noted. Single lead cardiac pacemaker is unchanged in position. Central mild vascular congestion and mild perihilar interstitial prominence without convincing pulmonary edema. No segmental infiltrate. Thoracic spine osteopenia.  IMPRESSION: Cardiomegaly. Central mild vascular congestion and mild perihilar interstitial prominence without convincing pulmonary edema. No segmental infiltrate   Electronically Signed   By: Lahoma Crocker M.D.   On: 01/21/2014 09:38   Admission HPI:  [name] is a 79 yo female with PMH of dCHF, sick sinus syndrome s/p PPM, HTN, A fib (no A/C due to GI bleed/high fall risk), OA, DM2. She presents  with CC of lower extremity edema. She reports that she has had worsening lower extremity edema for the last 3 weeks, this has been associated with some non descript pain in her lower extremities and some generalized weakness and fatigue. She denies any SOB or chest pain. She admits 2 pillow orthopnea but denies any worsening of this over the last few weeks. She does admit some coughing. She reports she has been taking lasix 20mg  once a day for CHF (she is prescribed this twice a day).  Hospital Course by problem list: 1. Acute on chronic diastolic congestive heart failure: The patient has a history of diastolic CHF and was prescribed 20mg  Lasix BID at home; however, she reported taking this medication only once a day. Her last ECHO  in Jan 2014 showed LV EF: 50% - 55%. On admission, she complained of 3-4 weeks of lower extremity swelling and pain as well as SOB and non-productive cough over the same time. Her BNP on admission was 10,317 (last BNP 1/14 was 992) and her weight was up 10lbs over a month ago. Her CXR showed cardiomegaly and pulmonary edema. She was started on 40mg  IV Lasix BID in the ED, and put out 2L of urine in <6 hours. She was admitted the hospital and continued on 40mg  IV Lasix BID overnight. By the next morning, her weight was down 7lbs from admission and her leg swelling was decreased, however her L leg was more swollen than the R. Her Lasix was then decreased to 20mg  IV BID. Bilateral PVL's were ordered to rule out DVT, and came back negative. Her troponin was also trended to rule out ACS, and was negative x3. At the time of discharge, her weight was down 11lbs from admission and her SOB and LE edema had resolved. She still continued to complain of pain in her L lower extremity that coincided with her ankle swelling. She was discharged on Lasix 20mg  BID. She will f/u in clinic with Dr. Alice Rieger on 10/16 at 2:15pm.   2. Hypertension: The patient has a history of HTN and takes Lisinopril 40mg  daily and metoprolol 100mg  BID at home. She was normotensive on presentation and was continued on her home medications while hospitalized. Her BP remained low with SBP in 101-103 range. Her metoprolol was kept at the same dose but her lisinopril was reduced to 20mg  daily. She will [facility] at [facility] for BP recheck.   3. Hypokalemia: Mild, thought to be secondary to Lasix BID use. She was on potassium supplementation at home in the past but this was discontinued. She received supplementation with potassium capsules while inpatient (smaller capsules that are easier to swallow) and will continue taking 74mEq of potassium per day. Her potassium was normal at 4.2 on the day of her discharge. She will follow up at the [facility] with repeat  BMET for K trending.   4. Dysphagia: Patient reports chronic dysphagia after a prior CVA that makes it hard to swallow her K+ supplements but this was changed to smaller capsules that she could swallow. She tolerated a regular diet well while in the hospital.   5. Atrial fibrillation: She has history of atrial fibrillation and was taking aspirin 81mg  daily and metoprolol 100mg  daily as an outpatient. She was previously on Coumadin for anti-coagulation, however it was stopped after she was found to have symptomatic anemia in the setting of a sub-acute GI Bleed. She also has a past h/o cardioembolic stroke and sick sinus syndrome s/p pacemaker placement.  She is followed by Dr. Lovena Le. In the ED, she was found to have atrial fibrilliation w/ nonsustained tachycardia into the 130's. Her EKG was otherwise unchanged from prior tracings, with L anterior fasicular block and old anterior infarct. At the time of her presentation, her HR had decreased to 97 and ranged from 70's-100's for the remained of her hospital stay. Repeat EKG's also showed atrial fibrillation and were unchanged. She was continued on her home Metoprolol 100mg  BID and aspirin 81mg  daily at the time of her discharge.   6. CKD (chronic kidney disease) stage 3, GFR 30-59 ml/min: Patient's baseline creatinine is 1.3. On admission, her creatinine was 1.2. With diuresis, her creatinine remained stable at 1.2-1.55.   7. Normocytic Anemia: She has a history of iron deficiency anemia and was previously on iron supplementation, which appears to have fallen off her med list in May. She also has a history of B12 deficiency. On presentation, she was mildly anemic with Hgb of 11.3 and MCV 94. She was also found to have mild thrombocytopenia (platelet count 143) and mild lymphopenia (WBC count 3.8), however these values increased to wnl the next day. On chart review, it appears her Hgb, WBC, and platelets have been low to borderline for many years. An anemia  panel was obtained and showed mildly low iron (38); ferretin, B12, and folate wnl; reticulocyte count of 1.5%. HIV antibody was nonreactive. SPEP was pending at the time of discharge.   8. Incontinence overflow, stress female: Her home Enablex was continued during her hospitalization.   9. Moderate Malnutrition: Due to chronic illness, with muscle wasting per physical exam. She was evaluated by the hospital dietitian and started on Boost Plus once daily and encouraged to continue taking this nutritional supplement after her discharge.   Discharge Vitals:   BP 101/43  Pulse 61  Temp(Src) 97.8 F (36.6 C) (Oral)  Resp 18  Ht 5\' 2"  (1.575 m)  Wt 118 lb 9.6 oz (53.797 kg)  BMI 21.69 kg/m2  SpO2 100%  Discharge Labs:  Results for orders placed during the hospital encounter of 01/21/14 (from the past 24 hour(s))  BASIC METABOLIC PANEL     Status: Abnormal   Collection Time    01/24/14  4:05 AM      Result Value Ref Range   Sodium 141  137 - 147 mEq/L   Potassium 4.2  3.7 - 5.3 mEq/L   Chloride 96  96 - 112 mEq/L   CO2 30  19 - 32 mEq/L   Glucose, Bld 111 (*) 70 - 99 mg/dL   BUN 14  6 - 23 mg/dL   Creatinine, Ser 1.38 (*) 0.50 - 1.10 mg/dL   Calcium 9.4  8.4 - 10.5 mg/dL   GFR calc non Af Amer 34 (*) >90 mL/min   GFR calc Af Amer 39 (*) >90 mL/min   Anion gap 15  5 - 15    Signed: Blain Pais, MD PGY3, IMTS 01/24/2014, 3:48 PM    Services Ordered on Discharge: None, the patient and her family denied Home Health needs  Equipment Ordered on Discharge: None

## 2014-01-24 NOTE — Discharge Instructions (Signed)
It was a pleasure taking care of you! You were hospitalized and treated for volume overload.  It is very important that your take your fluid pill, the Lasix 20mg  twice per day to prevent fluid from accumulating again.  Do not take Lasix after 3pm to avoid night time urination.  Avoid salt and foods with sodium. Please refer to the list below for other helpful information in regards to heart failure.     Heart Failure Heart failure means your heart has trouble pumping blood. This makes it hard for your body to work well. Heart failure is usually a long-term (chronic) condition. You must take good care of yourself and follow your doctor's treatment plan. HOME CARE  Take your heart medicine as told by your doctor.  Do not stop taking medicine unless your doctor tells you to.  Do not skip any dose of medicine.  Refill your medicines before they run out.  Take other medicines only as told by your doctor or pharmacist.  Stay active if told by your doctor. The elderly and people with severe heart failure should talk with a doctor about physical activity.  Eat heart-healthy foods. Choose foods that are without trans fat and are low in saturated fat, cholesterol, and salt (sodium). This includes fresh or frozen fruits and vegetables, fish, lean meats, fat-free or low-fat dairy foods, whole grains, and high-fiber foods. Lentils and dried peas and beans (legumes) are also good choices.  Limit salt if told by your doctor.  Cook in a healthy way. Roast, grill, broil, bake, poach, steam, or stir-fry foods.  Limit fluids as told by your doctor.  Weigh yourself every morning. Do this after you pee (urinate) and before you eat breakfast. Write down your weight to give to your doctor.  Take your blood pressure and write it down if your doctor tells you to.  Ask your doctor how to check your pulse. Check your pulse as told.  Lose weight if told by your doctor.  Stop smoking or chewing tobacco.  Do not use gum or patches that help you quit without your doctor's approval.  Schedule and go to doctor visits as told.  Nonpregnant women should have no more than 1 drink a day. Men should have no more than 2 drinks a day. Talk to your doctor about drinking alcohol.  Stop illegal drug use.  Stay current with shots (immunizations).  Manage your health conditions as told by your doctor.  Learn to manage your stress.  Rest when you are tired.  If it is really hot outside:  Avoid intense activities.  Use air conditioning or fans, or get in a cooler place.  Avoid caffeine and alcohol.  Wear loose-fitting, lightweight, and light-colored clothing.  If it is really cold outside:  Avoid intense activities.  Layer your clothing.  Wear mittens or gloves, a hat, and a scarf when going outside.  Avoid alcohol.  Learn about heart failure and get support as needed.  Get help to maintain or improve your quality of life and your ability to care for yourself as needed. GET HELP IF:   You gain 03 lb/1.4 kg or more in 1 day or 05 lb/2.3 kg in a week.  You are more short of breath than usual.  You cannot do your normal activities.  You tire easily.  You cough more than normal, especially with activity.  You have any or more puffiness (swelling) in areas such as your hands, feet, ankles, or belly (abdomen).  You cannot sleep because it is hard to breathe.  You feel like your heart is beating fast (palpitations).  You get dizzy or light-headed when you stand up. GET HELP RIGHT AWAY IF:   You have trouble breathing.  There is a change in mental status, such as becoming less alert or not being able to focus.  You have chest pain or discomfort.  You faint. MAKE SURE YOU:   Understand these instructions.  Will watch your condition.  Will get help right away if you are not doing well or get worse. Document Released: 01/12/2008 Document Revised: 08/19/2013 Document  Reviewed: 05/21/2012 [facility] Patient Information 2015 [place], [place]. This information is not intended to replace advice given to you by your health care provider. Make sure you discuss any questions you have with your health care provider.

## 2014-01-24 NOTE — Care Management (Signed)
Pt continues to refuse [facility] services.

## 2014-01-27 NOTE — Clinical Documentation Improvement (Signed)
Found face down at home by wife; was unresponsive and intubated in ED. Is S/P ICH with ventriculostomy.  After study, please clarify the likely etiology of the patient's presentation:   Aspiration pneumonia - doubt given CXR documented  CNS infection s/p drain - no evidence on MRI of brain  Cocaine induced seizures  Acute Hypercarbic Respiratory Failure 2/2 __________  Meningitis  Other Cause/Condition   Thank You, Zoila Shutter ,RN Clinical Documentation Specialist:  [facility] [employer]

## 2014-01-28 LAB — PROTEIN ELECTROPHORESIS, SERUM
ALPHA-2-GLOBULIN: 10.2 % (ref 7.1–11.8)
Albumin ELP: 49 % — ABNORMAL LOW (ref 55.8–66.1)
Alpha-1-Globulin: 5.7 % — ABNORMAL HIGH (ref 2.9–4.9)
Beta 2: 5.6 % (ref 3.2–6.5)
Beta Globulin: 6.2 % (ref 4.7–7.2)
GAMMA GLOBULIN: 23.3 % — AB (ref 11.1–18.8)
M-SPIKE, %: NOT DETECTED g/dL
Total Protein ELP: 6.7 g/dL (ref 6.0–8.3)

## 2014-01-31 ENCOUNTER — Ambulatory Visit (INDEPENDENT_AMBULATORY_CARE_PROVIDER_SITE_OTHER): Payer: Commercial Managed Care - HMO | Admitting: Internal Medicine

## 2014-01-31 ENCOUNTER — Encounter: Payer: Self-pay | Admitting: Internal Medicine

## 2014-01-31 VITALS — BP 118/68 | HR 81 | Temp 97.9°F | Ht 63.5 in | Wt 127.9 lb

## 2014-01-31 DIAGNOSIS — I5033 Acute on chronic diastolic (congestive) heart failure: Secondary | ICD-10-CM

## 2014-01-31 DIAGNOSIS — I10 Essential (primary) hypertension: Secondary | ICD-10-CM

## 2014-01-31 LAB — BASIC METABOLIC PANEL WITH GFR
BUN: 15 mg/dL (ref 6–23)
CALCIUM: 9.6 mg/dL (ref 8.4–10.5)
CO2: 25 mEq/L (ref 19–32)
CREATININE: 1.27 mg/dL — AB (ref 0.50–1.10)
Chloride: 104 mEq/L (ref 96–112)
GFR, EST AFRICAN AMERICAN: 44 mL/min — AB
GFR, Est Non African American: 38 mL/min — ABNORMAL LOW
GLUCOSE: 127 mg/dL — AB (ref 70–99)
Potassium: 4.9 mEq/L (ref 3.5–5.3)
SODIUM: 135 meq/L (ref 135–145)

## 2014-01-31 MED ORDER — FUROSEMIDE 20 MG PO TABS: ORAL_TABLET | ORAL | Status: DC

## 2014-01-31 NOTE — Assessment & Plan Note (Signed)
She has some evidence of increased volume, with a 9 pound weight gain since discharge. Her JVD is also slightly elevated. However, her lung exam does not reveal rales. She seems to be breathing comfortably. She is compliant with her diuretic therapy. Plan -I will increase Lasix to 40 mg in the morning and 20 mg in the evening -Check BMP today. -I have requested the patient's family to bring her back on Tuesday or Wednesday next week to recheck her volume status and possibly repeat a BMP given her change in diuretic therapy. They verbalize understanding, and they'll make an appointment. -Patient continues to use Lisinopril 20 mg daily, and Metoprolol100 mg twice a day. With this increase in Lasix dose, I am concerned about risk of inducing hypotension. Discussed with the patient and her family about this risk and instructed them to hold lisinopril or metoprolol dose in case she feels dizzy, or lightheaded. They verbalize understanding.

## 2014-01-31 NOTE — Progress Notes (Signed)
Patient ID: [name], female   DOB: [birth year], 80 y.o.   MRN: [id number]   Subjective:   HPI: [name] is a 80 y.o. woman with PMH of sick sinus syndrome (PCM in place), dCHF (TTE 04/2012, but noted to have diastolic), HTN, Afib with RVR, h/o stroke, stress incontinence who presents for follow up after hospital stay.    Reason(s) for this visit: 1. Patient was hospitalized for one day and discharged on 01/22/2014 for acute exacerbation of diastolic heart failure. She was treated with intravenous diuretics, and discharged on by mouth Lasix 20 mg twice a day. Patient and her family report compliancy. However, she complains that her pain in the left lower extremity has persisted, and she does also note some increased swelling of her lower extremities. She has tried Tylenol 500 mg last evening without much relief. After her hospital stay, and after diuretic therapy, her left lower extremity pain had improved. Lower extremity Doppler was performed, which ruled out a DVT. Her discharge weight was 118 pounds and she weighs 127 pounds today. She denies any dietary indiscretions. She has no shortness of breath, chest pain, or dizziness.   ROS: Constitutional: Denies fever, chills, diaphoresis. Appetite is poor from time to time.  Respiratory: Denies SOB, DOE, cough, chest tightness, and wheezing. Denies chest pain. CVS: No chest pain, palpitations and leg swelling.  GI: No abdominal pain, nausea, vomiting, bloody stools GU: No dysuria, frequency, hematuria, or flank pain.  MSK: No myalgias, back pain, joint swelling, arthralgias  Psych: No depression symptoms. No SI or SA.    Objective:  Physical Exam: Filed Vitals:   01/31/14 1417  BP: 118/68  Pulse: 81  Temp: 97.9 F (36.6 C)  TempSrc: Oral  Height: 5' 3.5" (1.613 m)  Weight: 127 lb 14.4 oz (58.015 kg)  SpO2: 100%   General: Well nourished. No acute distress. Sitting in a wheelchair. Her daughter present. HEENT: Normal  oral mucosa. MMM.  Lungs: CTA bilaterally. Heart: RRR; no extra sounds or murmurs  Abdomen: Non-distended, normal bowel sounds, soft, nontender; no hepatosplenomegaly  Extremities: Bilateral lower extremity edema L>R. No calf tenderness. Peripheral pulses are present. Noted some hyperpigmentation of the feet, which appears to be chronic. No joint swelling or tenderness. Neurologic: Normal EOM,  Alert and oriented x3. No obvious neurologic/cranial nerve deficits.  Assessment & Plan:  Discussed case with my attending in the clinic, Dr. Lynnae January. See problem based charting.

## 2014-01-31 NOTE — Patient Instructions (Signed)
General Instructions: Please take furosemide 40 mg (2 pills) in the morning and Take 20 mg (one pill) in the evening. Please come back on Tuesday or Wednesday next week Continue to elevate your leg to help with the swelling If you feel dizzy or lightheaded, call the clinic.   Thank you for bringing your medicines today. This helps [place] keep you safe from mistakes.   Progress Toward Treatment Goals:  Treatment Goal 01/31/2014  Hemoglobin A1C -  Blood pressure at goal  Prevent falls -    Self Care Goals & Plans:  Self Care Goal 01/31/2014  Manage my medications take my medicines as prescribed; bring my medications to every visit; refill my medications on time  Monitor my health -  Eat healthy foods -  Be physically active -  Prevent falls -  Meeting treatment goals -    Home Blood Glucose Monitoring 10/29/2012  Check my blood sugar no home glucose monitoring  When to check my blood sugar -     Care Management & Community Referrals:  Referral 05/23/2012  Referrals made for care management support none needed      Edema Edema is an abnormal buildup of fluids in your bodytissues. Edema is somewhatdependent on gravity to pull the fluid to the lowest place in your body. That makes the condition more common in the legs and thighs (lower extremities). Painless swelling of the feet and ankles is common and becomes more likely as you get older. It is also common in looser tissues, like around your eyes.  When the affected area is squeezed, the fluid may move out of that spot and leave a dent for a few moments. This dent is called pitting.  CAUSES  There are many possible causes of edema. Eating too much salt and being on your feet or sitting for a long time can cause edema in your legs and ankles. Hot weather may make edema worse. Common medical causes of edema include:  Heart failure.  Liver disease.  Kidney disease.  Weak blood vessels in your legs.  Cancer.  An  injury.  Pregnancy.  Some medications.  Obesity. SYMPTOMS  Edema is usually painless.Your skin may look swollen or shiny.  DIAGNOSIS  Your health care provider may be able to diagnose edema by asking about your medical history and doing a physical exam. You may need to have tests such as X-rays, an electrocardiogram, or blood tests to check for medical conditions that may cause edema.  TREATMENT  Edema treatment depends on the cause. If you have heart, liver, or kidney disease, you need the treatment appropriate for these conditions. General treatment may include:  Elevation of the affected body part above the level of your heart.  Compression of the affected body part. Pressure from elastic bandages or support stockings squeezes the tissues and forces fluid back into the blood vessels. This keeps fluid from entering the tissues.  Restriction of fluid and salt intake.  Use of a water pill (diuretic). These medications are appropriate only for some types of edema. They pull fluid out of your body and make you urinate more often. This gets rid of fluid and reduces swelling, but diuretics can have side effects. Only use diuretics as directed by your health care provider. HOME CARE INSTRUCTIONS   Keep the affected body part above the level of your heart when you are lying down.   Do not sit still or stand for prolonged periods.   Do not put anything directly  under your knees when lying down.  Do not wear constricting clothing or garters on your upper legs.   Exercise your legs to work the fluid back into your blood vessels. This may help the swelling go down.   Wear elastic bandages or support stockings to reduce ankle swelling as directed by your health care provider.   Eat a low-salt diet to reduce fluid if your health care provider recommends it.   Only take medicines as directed by your health care provider. SEEK MEDICAL CARE IF:   Your edema is not responding to  treatment.  You have heart, liver, or kidney disease and notice symptoms of edema.  You have edema in your legs that does not improve after elevating them.   You have sudden and unexplained weight gain. SEEK IMMEDIATE MEDICAL CARE IF:   You develop shortness of breath or chest pain.   You cannot breathe when you lie down.  You develop pain, redness, or warmth in the swollen areas.   You have heart, liver, or kidney disease and suddenly get edema.  You have a fever and your symptoms suddenly get worse. MAKE SURE YOU:   Understand these instructions.  Will watch your condition.  Will get help right away if you are not doing well or get worse. Document Released: 04/04/2005 Document Revised: 08/19/2013 Document Reviewed: 01/25/2013 [facility] Patient Information 2015 [place], [place]. This information is not intended to replace advice given to you by your health care provider. Make sure you discuss any questions you have with your health care provider.

## 2014-01-31 NOTE — Assessment & Plan Note (Signed)
BP Readings from Last 3 Encounters:  01/31/14 118/68  01/24/14 101/43  10/16/13 167/88    Lab Results  Component Value Date   NA 141 01/24/2014   K 4.2 01/24/2014   CREATININE 1.38* 01/24/2014    Assessment: Blood pressure control: controlled Progress toward BP goal:  at goal Comments: Compliant with medications  Plan: Medications:  Lisinopril 20 mg daily. Metoprolol 100 mg twice a day. Lasix 40 mg in the a.m. and 20 mg in PM. Educational resources provided:   Self management tools provided:   Other plans: BMP today. Hold metoprolol or lisinopril dose for lightheadedness, or dizziness. See details diastolic heart failure.  Follow up on Tuesday or Wednesday.

## 2014-02-04 NOTE — Progress Notes (Signed)
Internal Medicine Clinic Attending  Case discussed with Dr. Kazibwe soon after the resident saw the patient.  We reviewed the resident's history and exam and pertinent patient test results.  I agree with the assessment, diagnosis, and plan of care documented in the resident's note. 

## 2014-02-05 ENCOUNTER — Ambulatory Visit: Payer: Commercial Managed Care - HMO | Admitting: Internal Medicine

## 2014-02-06 ENCOUNTER — Encounter: Payer: Self-pay | Admitting: Internal Medicine

## 2014-02-06 ENCOUNTER — Ambulatory Visit: Payer: Commercial Managed Care - HMO | Admitting: Internal Medicine

## 2014-02-06 ENCOUNTER — Ambulatory Visit (INDEPENDENT_AMBULATORY_CARE_PROVIDER_SITE_OTHER): Payer: Commercial Managed Care - HMO | Admitting: Internal Medicine

## 2014-02-06 VITALS — BP 137/64 | HR 71 | Temp 98.2°F | Ht 63.5 in | Wt 125.7 lb

## 2014-02-06 DIAGNOSIS — I5033 Acute on chronic diastolic (congestive) heart failure: Secondary | ICD-10-CM

## 2014-02-06 LAB — BASIC METABOLIC PANEL WITH GFR
BUN: 10 mg/dL (ref 6–23)
CALCIUM: 9.6 mg/dL (ref 8.4–10.5)
CO2: 24 mEq/L (ref 19–32)
Chloride: 103 mEq/L (ref 96–112)
Creat: 1.06 mg/dL (ref 0.50–1.10)
GFR, EST AFRICAN AMERICAN: 55 mL/min — AB
GFR, EST NON AFRICAN AMERICAN: 48 mL/min — AB
Glucose, Bld: 100 mg/dL — ABNORMAL HIGH (ref 70–99)
Potassium: 3.9 mEq/L (ref 3.5–5.3)
Sodium: 137 mEq/L (ref 135–145)

## 2014-02-06 NOTE — Patient Instructions (Signed)
General Instructions: Please take lasix 40 mg in the morning and 20 mg around 2pm and 4pm We check your labs.   Thank you for bringing your medicines today. This helps [place] keep you safe from mistakes.   Progress Toward Treatment Goals:  Treatment Goal 01/31/2014  Hemoglobin A1C -  Blood pressure at goal  Prevent falls -    Self Care Goals & Plans:  Self Care Goal 01/31/2014  Manage my medications take my medicines as prescribed; bring my medications to every visit; refill my medications on time  Monitor my health -  Eat healthy foods -  Be physically active -  Prevent falls -  Meeting treatment goals -    Home Blood Glucose Monitoring 10/29/2012  Check my blood sugar no home glucose monitoring  When to check my blood sugar -     Care Management & Community Referrals:  Referral 05/23/2012  Referrals made for care management support none needed

## 2014-02-07 NOTE — Progress Notes (Signed)
Patient ID: [name], female   DOB: Aug 30, 1925, 80 y.o.   MRN: [id number]   Subjective:   HPI: [name] is a 80 y.o. woman with PMH of sick sinus syndrome (PCM in place), dCHF (TTE 04/2012, but noted to have diastolic), HTN, Afib with RVR, h/o stroke, stress incontinence who presents for follow up for bilateral, lower extremity edema.   Prior to her last office visit of 01/31/2014, she had been taking furosemide 20 mg twice daily. I recommended that she increase this to 40 mg in the morning and 20 mg in the afternoon. However, patient reports, that she's been taking only 40 mg daily, and not taking the evening dose due to fear of frequent urination. Review of her weights reveals that she has lost  2 pounds. She continues to complain of bilateral lower extremity edema. She also reports pain in the right lower extremity. Please see details in my assessment and plan.  ROS: Constitutional: Denies fever, chills, diaphoresis. Appetite is poor from time to time.  Respiratory: Denies SOB, DOE, cough, chest tightness, and wheezing. Denies chest pain. CVS: No chest pain, palpitations and leg swelling.  GI: No abdominal pain, nausea, vomiting, bloody stools GU: No dysuria, frequency, hematuria, or flank pain.  MSK: No myalgias, back pain, joint swelling, arthralgias  Psych: No depression symptoms. No SI or SA.    Objective:  Physical Exam: Filed Vitals:   02/06/14 1630  BP: 137/64  Pulse: 71  Temp: 98.2 F (36.8 C)  Height: 5' 3.5" (1.613 m)  Weight: 125 lb 11.2 oz (57.017 kg)  SpO2: 100%   General: Well nourished. No acute distress. Sitting in a wheelchair. Her daughter present. HEENT: Normal oral mucosa. MMM.  Lungs: CTA bilaterally. Heart: RRR; no extra sounds or murmurs  Abdomen: Non-distended, normal bowel sounds, soft, nontender; no hepatosplenomegaly  Extremities: Bilateral lower extremity edema L>R. No calf tenderness. Peripheral pulses are present. Noted some  hyperpigmentation of the feet, which appears to be chronic. No joint swelling or tenderness. Neurologic: Normal EOM,  Alert and oriented x3. No obvious neurologic/cranial nerve deficits.  Assessment & Plan:  Discussed case with my attending in the clinic, Dr. Beryle Beams. See problem based charting.

## 2014-02-07 NOTE — Assessment & Plan Note (Signed)
She still has some evidence of increased volume.  Wt Readings from Last 5 Encounters:  02/06/14 125 lb 11.2 oz (57.017 kg)  01/31/14 127 lb 14.4 oz (58.015 kg)  01/24/14 118 lb 9.6 oz (53.797 kg)  10/16/13 119 lb 1.6 oz (54.023 kg)  09/10/13 117 lb 8 oz (53.298 kg)   She has lost 2 pounds in the last 7 days. Her JVD is also slightly elevated. However, her lung exam does not reveal rales. She seems to be breathing comfortably. She's only been taking Lasix 40 mg daily as opposed to 40 mg in the morning and 20 mg in the evening as I recommended during her last visit of 01/31/2014. She's been upright of increased urination frequency, with the evening dose. Plan -I emphasized that Lasix to 40 mg in the morning and 20 mg in the evening might help take off extra volume. I advised her to take her morning dose when she wakes up at around 5 AM for her, and take the evening dose early in the afternoon, between 2 and 4 PM. She usually goes to bed at around 6 PM to 7 PM. This might help with the feel of increased urinary frequency during nighttime. -Check BMP today. -I have requested the patient's family to bring her back next week to recheck her volume status and possibly repeat a BMP given her change in diuretic therapy.  -Again, patient continues to use Lisinopril 20 mg daily, and Metoprolol100 mg twice a day. With this increase in Lasix dose, I am concerned about risk of inducing hypotension. I reemphasized to the patient and her family about this risk and instructed them to hold lisinopril or metoprolol dose in case she feels dizzy, or lightheaded.  -Daughter expressed needs to followup with her cardiologist in the coming few weeks. I agreed with this. Patient has atrial fibrillation, and apparently, has a pacemaker in place.

## 2014-02-07 NOTE — Progress Notes (Signed)
Medicine attending: Medical history, presenting complaints, physical findings, and medications, reviewed with resident physician Dr. Jessee Avers and I concur with his evaluation and management plan. Murriel Hopper, M.D., [place]

## 2014-02-13 ENCOUNTER — Encounter: Payer: Self-pay | Admitting: Internal Medicine

## 2014-02-13 ENCOUNTER — Ambulatory Visit: Payer: Commercial Managed Care - HMO | Admitting: Internal Medicine

## 2014-02-26 ENCOUNTER — Ambulatory Visit: Payer: Commercial Managed Care - HMO | Admitting: Internal Medicine

## 2014-02-27 ENCOUNTER — Ambulatory Visit (INDEPENDENT_AMBULATORY_CARE_PROVIDER_SITE_OTHER): Payer: Commercial Managed Care - HMO | Admitting: Internal Medicine

## 2014-02-27 ENCOUNTER — Other Ambulatory Visit: Payer: Self-pay | Admitting: *Deleted

## 2014-02-27 ENCOUNTER — Encounter: Payer: Self-pay | Admitting: Internal Medicine

## 2014-02-27 VITALS — BP 123/67 | HR 84 | Temp 98.2°F | Ht 63.5 in | Wt 120.8 lb

## 2014-02-27 DIAGNOSIS — Z0000 Encounter for general adult medical examination without abnormal findings: Secondary | ICD-10-CM

## 2014-02-27 DIAGNOSIS — I5033 Acute on chronic diastolic (congestive) heart failure: Secondary | ICD-10-CM

## 2014-02-27 DIAGNOSIS — I10 Essential (primary) hypertension: Secondary | ICD-10-CM

## 2014-02-27 LAB — BASIC METABOLIC PANEL WITH GFR
BUN: 22 mg/dL (ref 6–23)
CALCIUM: 9.9 mg/dL (ref 8.4–10.5)
CO2: 23 mEq/L (ref 19–32)
CREATININE: 1.32 mg/dL — AB (ref 0.50–1.10)
Chloride: 104 mEq/L (ref 96–112)
GFR, Est African American: 42 mL/min — ABNORMAL LOW
GFR, Est Non African American: 37 mL/min — ABNORMAL LOW
Glucose, Bld: 117 mg/dL — ABNORMAL HIGH (ref 70–99)
Potassium: 4.8 mEq/L (ref 3.5–5.3)
Sodium: 136 mEq/L (ref 135–145)

## 2014-02-27 LAB — LIPID PANEL
CHOLESTEROL: 103 mg/dL (ref 0–200)
HDL: 55 mg/dL (ref >39)
LDL Cholesterol: 35 mg/dL (ref 0–99)
Total CHOL/HDL Ratio: 1.9 Ratio
Triglycerides: 66 mg/dL (ref <150)
VLDL: 13 mg/dL (ref 0–40)

## 2014-02-27 MED ORDER — SOLIFENACIN SUCCINATE 10 MG PO TABS: 10.0000 mg | ORAL_TABLET | Freq: Every day | ORAL | Status: DC

## 2014-02-27 NOTE — Assessment & Plan Note (Signed)
Declines flu shot today. 

## 2014-02-27 NOTE — Assessment & Plan Note (Signed)
Improved will continue Lasix same dose  BMET, lipid check today  Will advise pt on dose of K to take per day and will have pt change to solution of K

## 2014-02-27 NOTE — Assessment & Plan Note (Signed)
BP controlled on Lisinopril 20 mg qd, Lopressor 100 mg bid, Lasix 40,20.   F/u in 2-3 months  Check BMET, lipid panel today

## 2014-02-27 NOTE — Patient Instructions (Signed)
General Instructions: Please follow up in 2-3 months sooner if needed  Take care  We will check labs today and I will let you know about your dose of potassium   Treatment Goals:  Goals (1 Years of Data) as of 02/27/14          As of Today 02/06/14 01/31/14 01/24/14 01/24/14     Blood Pressure   . Blood Pressure < 140/90  123/67 137/64 118/68 101/43 102/46     Diet   . Have 3 meals a day           Result Component   . HEMOGLOBIN A1C < 7.0         . LDL CALC < 100            Progress Toward Treatment Goals:  Treatment Goal 02/27/2014  Hemoglobin A1C -  Blood pressure at goal  Prevent falls -    Self Care Goals & Plans:  Self Care Goal 02/27/2014  Manage my medications take my medicines as prescribed; bring my medications to every visit; refill my medications on time; follow the sick day instructions if I am sick  Monitor my health keep track of my blood pressure  Eat healthy foods drink diet soda or water instead of juice or soda; eat more vegetables; eat foods that are low in salt; eat baked foods instead of fried foods; eat fruit for snacks and desserts; eat smaller portions  Be physically active find an activity I enjoy  Prevent falls -  Meeting treatment goals maintain the current self-care plan    Home Blood Glucose Monitoring 10/29/2012  Check my blood sugar no home glucose monitoring  When to check my blood sugar -     Care Management & Community Referrals:  Referral 02/27/2014  Referrals made for care management support none needed  Referrals made to community resources none

## 2014-02-27 NOTE — Addendum Note (Signed)
Addended by: Cresenciano Genre on: 02/27/2014 01:02 PM   Modules accepted: Level of Service

## 2014-02-27 NOTE — Progress Notes (Signed)
   Subjective:    Patient ID: [name], female    DOB: 1925/06/22, 80 y.o.   MRN: [id number]  HPI Comments: 80 y.o presents for f/u from last visit  1. Recently discharge with acute diastolic HF. Lasix was changed to 40, 20 pt is compliant, denies sob, chest pain, lower ext edema, weight is down 5 lbs. Will repeat BMET today 2. Wants to know if should still take Kdur 10 meQ x 4 b/c it is hard to swallow those big pills.  Daughter is with her at the visit   HM: -due for cholesterol check, declines flu shot     Review of Systems  Respiratory: Negative for shortness of breath.   Cardiovascular: Negative for chest pain and leg swelling.  Gastrointestinal: Negative for constipation.       Objective:   Physical Exam  Constitutional: She is oriented to person, place, and time. Vital signs are normal. She appears well-developed and well-nourished. She is cooperative. No distress.  HENT:  Head: Normocephalic and atraumatic.  Mouth/Throat: Oropharynx is clear and moist. No oropharyngeal exudate.  Eyes: Conjunctivae are normal. Right eye exhibits no discharge. Left eye exhibits no discharge. No scleral icterus.  Cardiovascular: Normal rate, regular rhythm and normal heart sounds.   No murmur heard. No lower ext edema   Pulmonary/Chest: Effort normal and breath sounds normal. No respiratory distress. She has no wheezes.  Abdominal: Soft. Bowel sounds are normal. There is no tenderness.  Neurological: She is alert and oriented to person, place, and time. Gait normal.  BL in wheelchair   Skin: Skin is warm, dry and intact. No rash noted. She is not diaphoretic.  Psychiatric: She has a normal mood and affect. Her speech is normal and behavior is normal. Judgment and thought content normal. Cognition and memory are normal.  Nursing note and vitals reviewed.         Assessment & Plan:  F/u in 2-3 months sooner if needed

## 2014-02-28 ENCOUNTER — Other Ambulatory Visit: Payer: Self-pay | Admitting: Internal Medicine

## 2014-02-28 ENCOUNTER — Encounter: Payer: Self-pay | Admitting: Internal Medicine

## 2014-02-28 MED ORDER — POTASSIUM CHLORIDE 20 MEQ/15ML (10%) PO SOLN: 20.0000 meq | Freq: Every day | ORAL | Status: DC

## 2014-02-28 NOTE — Progress Notes (Signed)
Internal Medicine Clinic Attending  Case discussed with Dr. McLean soon after the resident saw the patient.  We reviewed the resident's history and exam and pertinent patient test results.  I agree with the assessment, diagnosis, and plan of care documented in the resident's note. 

## 2014-03-26 ENCOUNTER — Encounter: Payer: Self-pay | Admitting: Internal Medicine

## 2014-03-26 ENCOUNTER — Ambulatory Visit (INDEPENDENT_AMBULATORY_CARE_PROVIDER_SITE_OTHER): Payer: Commercial Managed Care - HMO | Admitting: Internal Medicine

## 2014-03-26 VITALS — BP 120/64 | HR 86 | Ht 60.0 in | Wt 121.6 lb

## 2014-03-26 DIAGNOSIS — I4891 Unspecified atrial fibrillation: Secondary | ICD-10-CM

## 2014-03-26 DIAGNOSIS — Z950 Presence of cardiac pacemaker: Secondary | ICD-10-CM

## 2014-03-26 DIAGNOSIS — I5033 Acute on chronic diastolic (congestive) heart failure: Secondary | ICD-10-CM

## 2014-03-26 LAB — MDC_IDC_ENUM_SESS_TYPE_INCLINIC
Battery Impedance: 183 Ohm
Battery Remaining Longevity: 114 mo
Battery Voltage: 2.79 V
Brady Statistic RV Percent Paced: 39 %
Lead Channel Impedance Value: 0 Ohm
Lead Channel Sensing Intrinsic Amplitude: 11.2 mV
Lead Channel Setting Pacing Amplitude: 2.5 V
Lead Channel Setting Pacing Pulse Width: 0.4 ms
Lead Channel Setting Sensing Sensitivity: 4 mV
MDC IDC MSMT LEADCHNL RV IMPEDANCE VALUE: 716 Ohm
MDC IDC MSMT LEADCHNL RV PACING THRESHOLD AMPLITUDE: 0.75 V
MDC IDC MSMT LEADCHNL RV PACING THRESHOLD PULSEWIDTH: 0.4 ms
MDC IDC SESS DTM: 20151209173348

## 2014-03-26 NOTE — Progress Notes (Signed)
PCP: Gilles Chiquito, MD  [name] is a 80 y.o. female who presents today for routine electrophysiology followup. She was hospitalized back in October with acute on chronic diastolic heart failure, and responded to intravenous diuretic therapy. Since then, she has done well, and denies syncope, and denies chest pain or shortness of breath..  Today, she denies symptoms of palpitations, chest pain, shortness of breath,  lower extremity edema, dizziness, presyncope, or syncope.  The patient is otherwise without complaint today.   Past Medical History  Diagnosis Date  . Weight loss, unintentional 2010-2011    8/08-8/10 138-146 lbs, between 8/10-10/11 lost 18 lbs., work-up negative  . Incontinence overflow, stress female     on vesicare  . History of sick sinus syndrome s/p pacemaker Dr. Lovena Le 2000  . Atrial fibrillation     on coumadin followed by Dr. Elie Confer  . History of vaginal bleeding 05/2005    nagative endometrial biopsy  . Restrictive lung disease 1996    PFT's showed mild disease  . Anemia     Iron deficiency, does not tolerate po  . CHF (congestive heart failure)     diastolic dysfunction  . Hypertension     well controlled on 3 agents  . Osteoporosis   . Allergic rhinitis   . Onychomycosis   . History of trichomonal urethritis   . Constipation   . Diverticulosis of colon (without mention of hemorrhage)   . Pacemaker   . Type II diabetes mellitus     diet controlled  . Stroke 2002    R MCA, cardioembolic  . Arthritis     "all over my body"  . DJD (degenerative joint disease)    Past Surgical History  Procedure Laterality Date  . Cholecystectomy    . Cataract extraction    . Abdominal hysterectomy    . Pacemaker generator change  10/2012    AdaptaL 10/2012 by Dr Lovena Le  . Insert / replace / remove pacemaker  2000    MDT Sigma pacemaker implanted by Dr Lovena Le;    Current Outpatient Prescriptions  Medication Sig Dispense Refill  . aspirin EC 81 MG tablet Take  81 mg by mouth daily.    . diclofenac sodium (VOLTAREN) 1 % GEL Apply 2 g topically 4 (four) times daily as needed (for pain).     . furosemide (LASIX) 20 MG tablet Take 40 mg (2tablets) in the morning and take 20 mg (1 tablet) in the evening (Patient taking differently: Take 40 mg (2 tablets) by mouth in the morning and take 20 mg (1 tablet) by mouth in the evening) 30 tablet 6  . lisinopril (PRINIVIL,ZESTRIL) 20 MG tablet Take 1 tablet (20 mg total) by mouth daily. 30 tablet 2  . metoprolol (LOPRESSOR) 100 MG tablet Take 1 tablet (100 mg total) by mouth 2 (two) times daily. 60 tablet 3  . potassium chloride 20 MEQ/15ML (10%) SOLN Take 15 mLs (20 mEq total) by mouth daily. 473 mL 1  . solifenacin (VESICARE) 10 MG tablet Take 1 tablet (10 mg total) by mouth daily. 90 tablet 4  . [DISCONTINUED] solifenacin (VESICARE) 10 MG tablet Take 1 tablet (10 mg total) by mouth daily. 30 tablet 5   No current facility-administered medications for this visit.    Physical Exam: Filed Vitals:   03/26/14 1230  BP: 120/64  Pulse: 86  Height: 5' (1.524 m)  Weight: 121 lb 9.6 oz (55.157 kg)    GEN- The patient is elderly appearing, alert  and oriented x 3 today.   Head- normocephalic, atraumatic Eyes-  Sclera clear, conjunctiva pink Ears- hearing intact Oropharynx- clear Lungs- Clear to ausculation bilaterally, normal work of breathing Chest- pacemaker pocket is well healed Heart- IRegular rate and rhythm, no murmurs, rubs or gallops, PMI not laterally displaced GI- soft, NT, ND, + BS Extremities- no clubbing, cyanosis, or edema  Pacemaker interrogation- reviewed in detail today,  See PACEART report   Assessment and Plan:

## 2014-03-26 NOTE — Assessment & Plan Note (Signed)
Her Medtronic single-chamber pacemaker is working normally. She is pacing approximately 40% of the time.

## 2014-03-26 NOTE — Assessment & Plan Note (Addendum)
Her ventricular rate is now well-controlled. The patient will continue her current medical therapy including beta blockers. The patient is not thought to be an anticoagulation candidate secondary to a history of vaginal bleeding.

## 2014-03-26 NOTE — Patient Instructions (Signed)
Your physician wants you to follow-up in: 12 months with Dr. Knox Saliva will receive a reminder letter in the mail two months in advance. If you don't receive a letter, please call our office to schedule the follow-up appointment.   Remote monitoring is used to monitor your Pacemaker or ICD from home. This monitoring reduces the number of office visits required to check your device to one time per year. It allows [place] to keep an eye on the functioning of your device to ensure it is working properly. You are scheduled for a device check from home on 06/25/14. You may send your transmission at any time that day. If you have a wireless device, the transmission will be sent automatically. After your physician reviews your transmission, you will receive a postcard with your next transmission date.

## 2014-03-26 NOTE — Assessment & Plan Note (Signed)
The patient was hospitalized 2 months ago with acute on chronic diastolic heart failure. She was treated with diuretic therapy and now is doing well. She is weighing herself daily. Her weights at been stable. She will continue her current medications including Lasix.

## 2014-03-27 ENCOUNTER — Encounter (HOSPITAL_COMMUNITY): Payer: Self-pay | Admitting: Internal Medicine

## 2014-04-01 ENCOUNTER — Encounter: Payer: Self-pay | Admitting: Internal Medicine

## 2014-06-03 ENCOUNTER — Telehealth: Payer: Self-pay | Admitting: Internal Medicine

## 2014-06-03 NOTE — Telephone Encounter (Signed)
Call to patient to confirm appointment for 06/04/14 at 10:15 lmtcb

## 2014-06-04 ENCOUNTER — Encounter: Payer: Commercial Managed Care - HMO | Admitting: Internal Medicine

## 2014-06-04 ENCOUNTER — Encounter: Payer: Self-pay | Admitting: Internal Medicine

## 2014-06-11 ENCOUNTER — Encounter: Payer: Self-pay | Admitting: Internal Medicine

## 2014-06-11 ENCOUNTER — Ambulatory Visit (INDEPENDENT_AMBULATORY_CARE_PROVIDER_SITE_OTHER): Payer: Commercial Managed Care - HMO | Admitting: Internal Medicine

## 2014-06-11 VITALS — BP 133/54 | HR 68 | Temp 98.0°F | Ht 64.0 in | Wt 118.3 lb

## 2014-06-11 DIAGNOSIS — I10 Essential (primary) hypertension: Secondary | ICD-10-CM | POA: Diagnosis not present

## 2014-06-11 DIAGNOSIS — K219 Gastro-esophageal reflux disease without esophagitis: Secondary | ICD-10-CM | POA: Diagnosis not present

## 2014-06-11 DIAGNOSIS — N183 Chronic kidney disease, stage 3 unspecified: Secondary | ICD-10-CM

## 2014-06-11 DIAGNOSIS — I130 Hypertensive heart and chronic kidney disease with heart failure and stage 1 through stage 4 chronic kidney disease, or unspecified chronic kidney disease: Secondary | ICD-10-CM | POA: Diagnosis not present

## 2014-06-11 DIAGNOSIS — Z7982 Long term (current) use of aspirin: Secondary | ICD-10-CM

## 2014-06-11 DIAGNOSIS — I5033 Acute on chronic diastolic (congestive) heart failure: Secondary | ICD-10-CM

## 2014-06-11 DIAGNOSIS — E876 Hypokalemia: Secondary | ICD-10-CM

## 2014-06-11 DIAGNOSIS — D649 Anemia, unspecified: Secondary | ICD-10-CM

## 2014-06-11 DIAGNOSIS — N39490 Overflow incontinence: Secondary | ICD-10-CM | POA: Diagnosis not present

## 2014-06-11 DIAGNOSIS — R8290 Unspecified abnormal findings in urine: Secondary | ICD-10-CM

## 2014-06-11 DIAGNOSIS — N393 Stress incontinence (female) (male): Secondary | ICD-10-CM

## 2014-06-11 DIAGNOSIS — R8299 Other abnormal findings in urine: Secondary | ICD-10-CM | POA: Diagnosis not present

## 2014-06-11 DIAGNOSIS — M545 Low back pain: Secondary | ICD-10-CM

## 2014-06-11 DIAGNOSIS — Z950 Presence of cardiac pacemaker: Secondary | ICD-10-CM

## 2014-06-11 DIAGNOSIS — Z9181 History of falling: Secondary | ICD-10-CM

## 2014-06-11 LAB — CBC WITH DIFFERENTIAL/PLATELET
BASOS PCT: 1 % (ref 0–1)
Basophils Absolute: 0 10*3/uL (ref 0.0–0.1)
Eosinophils Absolute: 0.1 10*3/uL (ref 0.0–0.7)
Eosinophils Relative: 2 % (ref 0–5)
HEMATOCRIT: 35.9 % — AB (ref 36.0–46.0)
Hemoglobin: 11.7 g/dL — ABNORMAL LOW (ref 12.0–15.0)
Lymphocytes Relative: 44 % (ref 12–46)
Lymphs Abs: 1.7 10*3/uL (ref 0.7–4.0)
MCH: 31.2 pg (ref 26.0–34.0)
MCHC: 32.6 g/dL (ref 30.0–36.0)
MCV: 95.7 fL (ref 78.0–100.0)
MPV: 9.7 fL (ref 8.6–12.4)
Monocytes Absolute: 0.3 10*3/uL (ref 0.1–1.0)
Monocytes Relative: 9 % (ref 3–12)
NEUTROS ABS: 1.7 10*3/uL (ref 1.7–7.7)
Neutrophils Relative %: 44 % (ref 43–77)
Platelets: 183 10*3/uL (ref 150–400)
RBC: 3.75 MIL/uL — ABNORMAL LOW (ref 3.87–5.11)
RDW: 14.9 % (ref 11.5–15.5)
WBC: 3.8 10*3/uL — AB (ref 4.0–10.5)

## 2014-06-11 LAB — BASIC METABOLIC PANEL WITH GFR
BUN: 18 mg/dL (ref 6–23)
CALCIUM: 9.2 mg/dL (ref 8.4–10.5)
CHLORIDE: 101 meq/L (ref 96–112)
CO2: 25 mEq/L (ref 19–32)
Creat: 1.05 mg/dL (ref 0.50–1.10)
GFR, Est African American: 56 mL/min — ABNORMAL LOW
GFR, Est Non African American: 48 mL/min — ABNORMAL LOW
Glucose, Bld: 181 mg/dL — ABNORMAL HIGH (ref 70–99)
POTASSIUM: 3.7 meq/L (ref 3.5–5.3)
Sodium: 136 mEq/L (ref 135–145)

## 2014-06-11 LAB — POCT URINALYSIS DIPSTICK
Bilirubin, UA: NEGATIVE
Glucose, UA: NEGATIVE
Ketones, UA: NEGATIVE
Nitrite, UA: POSITIVE
RBC UA: NEGATIVE
Spec Grav, UA: 1.015
UROBILINOGEN UA: 1
pH, UA: 7.5

## 2014-06-11 MED ORDER — ACETAMINOPHEN-CODEINE #3 300-30 MG PO TABS: 1.0000 | ORAL_TABLET | Freq: Four times a day (QID) | ORAL | Status: DC | PRN

## 2014-06-11 NOTE — Progress Notes (Signed)
Subjective:    Patient ID: [name], female    DOB: 03-05-1926, 81 y.o.   MRN: [id number]  CC: Routine follow up, recent fall  HPI  [name] is an 81yo woman with PMH of SSS (PCM in place), dCHF, HTN, Afib with RVR, h/o stroke, stress incontinence  Since I last saw her, she was hospitalized for dCHF.  She has been seen a few times since then and appears to be doing well.    She was seen in Dr. Tanna Furry office on 03/26/14 for her Afib.  Her PCM was evaluated as well.  No medications changes were made.  She is not on anticoagulation due to her history of vaginal bleeding.    Today, [name] reports that she had a recent fall.  She reports falling on her bottom b/c her knee gave out, which is a chronic issue for her.  She did not hit her head.  She hit her bottom and feels that she has a bruise there.  She was taken off tramadol a while ago b/c she was too sleepy on the medication.      She has occasional "Sick on stomach" feeling after eating, but this is not debilitating. She eats a hard candy and it stops.  No vomiting.    New, strong smell to urine. No abdominal pain, dysuria, hematuria or enuresis.    Also reports dry eyes with crusting in the morning.   Reviewed medications and updated in chart.      Medication List       This list is accurate as of: 06/11/14 11:59 PM.  Always use your most recent med list.               acetaminophen-codeine 300-30 MG per tablet  Commonly known as:  TYLENOL #3  Take 1 tablet by mouth every 6 (six) hours as needed for moderate pain.     aspirin EC 81 MG tablet  Take 81 mg by mouth daily.     diclofenac sodium 1 % Gel  Commonly known as:  VOLTAREN  Apply 2 g topically 4 (four) times daily as needed (for pain).     furosemide 20 MG tablet  Commonly known as:  LASIX  Take 40 mg (2tablets) in the morning and take 20 mg (1 tablet) in the evening     lisinopril 20 MG tablet  Commonly known as:  PRINIVIL,ZESTRIL  Take 1  tablet (20 mg total) by mouth daily.     metoprolol 100 MG tablet  Commonly known as:  LOPRESSOR  Take 1 tablet (100 mg total) by mouth 2 (two) times daily.     potassium chloride 10 MEQ CR capsule  Commonly known as:  MICRO-K     solifenacin 10 MG tablet  Commonly known as:  VESICARE  Take 1 tablet (10 mg total) by mouth daily.        Review of Systems  Constitutional: Negative for chills, fatigue and unexpected weight change.  HENT: Negative for ear discharge and ear pain.   Eyes: Negative for photophobia and visual disturbance.  Respiratory: Positive for cough (dry cough, off an on. ). Negative for choking and shortness of breath.   Cardiovascular: Negative for chest pain and leg swelling.  Gastrointestinal: Negative for abdominal pain, diarrhea, constipation and blood in stool.  Genitourinary: Negative for dysuria, enuresis and difficulty urinating.       Smell to urine  Musculoskeletal: Positive for arthralgias (knee pain) and gait problem (  using cane). Negative for back pain.  Skin: Negative for rash and wound.  Neurological: Negative for dizziness, weakness and light-headedness.      Objective:   Physical Exam  Constitutional: She is oriented to person, place, and time.  Thin elderly woman, NAD  HENT:  Head: Normocephalic and atraumatic.  Mouth/Throat: No oropharyngeal exudate.  Eyes: Conjunctivae are normal. No scleral icterus.  Cardiovascular:  Regular, with some early beats, no murmur noted, pacemaker in place, no tenderness over site.   Pulmonary/Chest: Effort normal and breath sounds normal. No respiratory distress. She has no wheezes.  Abdominal: Soft. Bowel sounds are normal. She exhibits no distension. There is no tenderness.  Musculoskeletal: She exhibits no edema or tenderness.  Neurological: She is alert and oriented to person, place, and time. No cranial nerve deficit. Coordination normal.  Skin: Skin is warm and dry. No erythema.  Psychiatric: She has  a normal mood and affect. Her behavior is normal.    BMET, CBC today, UA  UA nitrite +, UC multiple morphotypes.     Assessment & Plan:  RTC in 4 months, sooner if needed

## 2014-06-11 NOTE — Patient Instructions (Signed)
General Instructions: Please schedule a follow up visit within the next 4 months.   For your medications:   Please bring all of your pill  Bottles with you to each visit.  This will help make sure that we have an up to date list of all the medications you are taking.  Please also bring any over the counter herbal medications you are taking (not including advil, tylenol, etc.)  Please continue taking all of your medications as prescribed.   Please start tylenol #3 for your pain after your fall.  Please only take prior to bedtime before you know how it affects you.  If it makes you too drowsy, please stop the medication.    For your dry eye, please try Systane eye drops over the counter before bed.   Thank you!    Treatment Goals:  Goals (1 Years of Data) as of 06/11/14          As of Today 03/26/14 02/27/14 02/06/14 01/31/14     Blood Pressure   . Blood Pressure < 140/90  133/54 120/64 123/67 137/64 118/68     Diet   . Have 3 meals a day           Result Component   . HEMOGLOBIN A1C < 7.0         . LDL CALC < 100    35        Progress Toward Treatment Goals:  Treatment Goal 06/11/2014  Hemoglobin A1C -  Blood pressure at goal  Prevent falls unchanged    Self Care Goals & Plans:  Self Care Goal 06/11/2014  Manage my medications take my medicines as prescribed; bring my medications to every visit; refill my medications on time  Monitor my health keep track of my blood pressure  Eat healthy foods eat more vegetables; eat foods that are low in salt  Be physically active find an activity I enjoy  Prevent falls use home fall prevention checklist to improve safety; have my vision checked  Meeting treatment goals -    Home Blood Glucose Monitoring 10/29/2012  Check my blood sugar no home glucose monitoring  When to check my blood sugar -     Care Management & Community Referrals:  Referral 06/11/2014  Referrals made for care management support none needed  Referrals  made to community resources -

## 2014-06-12 LAB — URINE CULTURE: Colony Count: >=100000

## 2014-06-13 DIAGNOSIS — R8290 Unspecified abnormal findings in urine: Secondary | ICD-10-CM | POA: Insufficient documentation

## 2014-06-13 NOTE — Assessment & Plan Note (Signed)
Continue vesicare 

## 2014-06-13 NOTE — Assessment & Plan Note (Signed)
Fall Screening 03/05/2013 07/09/2013 10/16/2013 01/31/2014 02/06/2014 02/27/2014 06/11/2014  Falls in the past year? Yes No No No No No Yes  Number of falls in past year 2 or more - - - - - 1  Was there an injury with Fall? - - - - - - Yes  Risk Factor Category  High Fall Risk - - - - - High Fall Risk      Assessment: Progress toward fall prevention goal:  unchanged Comments: She continues to rise from a seated position too quickly  Plan: Fall prevention plans: We discussed using her cane, rising slowly from a seated position, etc.  She is aware of these interventions.  Educational resources provided:   Self management tools provided:     She had a bruise on her bottom making it difficult for her to sleep. Trial of tylenol #3 while she heals, no further refills.

## 2014-06-13 NOTE — Assessment & Plan Note (Signed)
Doing well, continue lasix, aspirin, potassium.  Recently seen by Dr. Lovena Le.

## 2014-06-13 NOTE — Assessment & Plan Note (Signed)
Urinalysis was nitrite +, but UC did not show one bacteria.  This is likely a dirty catch.  I have given her recommendations of when to call the clinic if she develops abdominal pain, dysuria, fever, chills, etc.

## 2014-06-13 NOTE — Assessment & Plan Note (Signed)
K stable today, she is on lasix and K supplementation.

## 2014-06-13 NOTE — Assessment & Plan Note (Signed)
On no medications, uses hard candy when she has dyspepsia with good results.

## 2014-06-13 NOTE — Assessment & Plan Note (Signed)
BP Readings from Last 3 Encounters:  06/11/14 133/54  03/26/14 120/64  02/27/14 123/67    Lab Results  Component Value Date   NA 136 06/11/2014   K 3.7 06/11/2014   CREATININE 1.05 06/11/2014    Assessment: Blood pressure control: controlled Progress toward BP goal:  at goal Comments: doing well taking her medications  Plan: Medications:  continue current medications, lisinopril, metoprolol Educational resources provided:   Self management tools provided:   Other plans: BMET today shows good renal function, K is stable.

## 2014-06-13 NOTE — Assessment & Plan Note (Signed)
Has been stable and continues to be on check today.   BMET    Component Value Date/Time   NA 136 06/11/2014 1124   K 3.7 06/11/2014 1124   CL 101 06/11/2014 1124   CO2 25 06/11/2014 1124   GLUCOSE 181* 06/11/2014 1124   BUN 18 06/11/2014 1124   CREATININE 1.05 06/11/2014 1124        CALCIUM 9.2 06/11/2014 1124   GFRNONAA 48* 06/11/2014 1124        GFRAA 56* 06/11/2014 1124

## 2014-06-25 ENCOUNTER — Encounter: Payer: Commercial Managed Care - HMO | Admitting: *Deleted

## 2014-06-25 ENCOUNTER — Telehealth: Payer: Self-pay | Admitting: Cardiology

## 2014-06-25 NOTE — Telephone Encounter (Signed)
Spoke w/ pt and she stated that she did not want to use home monitor. Pt agreed to appt on 6-13 at 12:00.

## 2014-06-26 ENCOUNTER — Encounter: Payer: Self-pay | Admitting: Cardiology

## 2014-06-30 ENCOUNTER — Other Ambulatory Visit: Payer: Self-pay | Admitting: Internal Medicine

## 2014-09-17 ENCOUNTER — Encounter: Payer: Self-pay | Admitting: Internal Medicine

## 2014-09-17 ENCOUNTER — Ambulatory Visit (INDEPENDENT_AMBULATORY_CARE_PROVIDER_SITE_OTHER): Payer: Commercial Managed Care - HMO | Admitting: Internal Medicine

## 2014-09-17 VITALS — BP 115/60 | HR 82 | Temp 98.0°F | Wt 128.0 lb

## 2014-09-17 DIAGNOSIS — I4891 Unspecified atrial fibrillation: Secondary | ICD-10-CM | POA: Diagnosis not present

## 2014-09-17 DIAGNOSIS — I10 Essential (primary) hypertension: Secondary | ICD-10-CM

## 2014-09-17 DIAGNOSIS — I69392 Facial weakness following cerebral infarction: Secondary | ICD-10-CM | POA: Diagnosis not present

## 2014-09-17 DIAGNOSIS — Z7982 Long term (current) use of aspirin: Secondary | ICD-10-CM

## 2014-09-17 DIAGNOSIS — I5033 Acute on chronic diastolic (congestive) heart failure: Secondary | ICD-10-CM | POA: Diagnosis not present

## 2014-09-17 DIAGNOSIS — Z8673 Personal history of transient ischemic attack (TIA), and cerebral infarction without residual deficits: Secondary | ICD-10-CM

## 2014-09-17 DIAGNOSIS — D649 Anemia, unspecified: Secondary | ICD-10-CM | POA: Diagnosis not present

## 2014-09-17 NOTE — Assessment & Plan Note (Signed)
Rate controlled today.  She has a PM in place.  Continue metroprolol.

## 2014-09-17 NOTE — Assessment & Plan Note (Signed)
BP Readings from Last 3 Encounters:  09/17/14 115/60  06/11/14 133/54  03/26/14 120/64    Lab Results  Component Value Date   NA 136 06/11/2014   K 3.7 06/11/2014   CREATININE 1.05 06/11/2014    Assessment: Blood pressure control: controlled Progress toward BP goal:  at goal Comments: Doing well  Plan: Medications:  continue current medications, lisinopril, metoprolol, lasix Educational resources provided: brochure Self management tools provided: other (see comments) Other plans: Check BMET at next visit if needed.

## 2014-09-17 NOTE — Assessment & Plan Note (Signed)
Stable over last few years.  Likely related to CKD.

## 2014-09-17 NOTE — Assessment & Plan Note (Signed)
Follows with Dr. Lovena Le.  She does have LE swelling today and worsening SOB on exertion.  She is not taking her lasix as prescribed.  This has been an issue before leading to an admission for CHF exacerbation.  Her lungs sound clear today.  I think she just needs to take her lasix more regularly.  She gets 20mg  pills and should be on 2 in the AM and 1 at night.  I advised her to take the 2 pills in the AM as regularly as possible and that she can take the evening dose around 4 pm so as to avoid urinary issues overnight.  She is due to see Dr. Lovena Le in July.  She is on lisinopril, metoprolol, aspirin.   She is only on a baby aspirin, she has a history of stroke.  Will do chart review to see if she should actually be on a full aspirin and inform her daughter.

## 2014-09-17 NOTE — Assessment & Plan Note (Signed)
She has no weakness or new symptoms.  She does have a chronic right sided facial droop.

## 2014-09-17 NOTE — Patient Instructions (Signed)
General Instructions: Please schedule a follow up visit within the next 4 months.   For your medications:   Please bring all of your pill  Bottles with you to each visit.  This will help make sure that we have an up to date list of all the medications you are taking.  Please also bring any over the counter herbal medications you are taking (not including advil, tylenol, etc.)  Please continue taking all of your medications as prescribed.   Please call the clinic and leave a message for me if you should have any concerns or further questions.   Thank you!   Treatment Goals:  Goals (1 Years of Data) as of 09/17/14          As of Today 06/11/14 03/26/14 02/27/14 02/06/14     Blood Pressure   . Blood Pressure < 140/90  115/60 133/54 120/64 123/67 137/64     Diet   . Have 3 meals a day           Result Component   . HEMOGLOBIN A1C < 7.0         . LDL CALC < 100     35       Progress Toward Treatment Goals:  Treatment Goal 09/17/2014  Hemoglobin A1C -  Blood pressure at goal  Prevent falls -    Self Care Goals & Plans:  Self Care Goal 09/17/2014  Manage my medications take my medicines as prescribed; bring my medications to every visit; refill my medications on time  Monitor my health keep track of my blood pressure  Eat healthy foods eat more vegetables; eat foods that are low in salt; eat baked foods instead of fried foods  Be physically active find an activity I enjoy  Prevent falls -  Meeting treatment goals -    Home Blood Glucose Monitoring 10/29/2012  Check my blood sugar no home glucose monitoring  When to check my blood sugar -     Care Management & Community Referrals:  Referral 06/11/2014  Referrals made for care management support none needed  Referrals made to community resources -

## 2014-09-17 NOTE — Progress Notes (Signed)
Subjective:    Patient ID: [name], female    DOB: Apr 08, 1926, 81 y.o.   MRN: [id number]  CC: Routine visit for Diabetes and HTN  HPI  [name] is an 81yo woman with PMH of chronic diastolic CHF, Afib/sick sinus with PCM in place, h/o stroke, HTN, normocytic anemia, osteoporosis who presents for follow up.   SOB sometimes with exertion, making up bed, sweeping, has to sit down and rest.  Cough and sometimes gets strangled with it.  No falls, no dizziness, no lightheadedness.  + swelling in the leg, for about a week.  She is not taking lasix as prescribed.  Only one pill in the morning, nothing at night, sometimes does not take lasix at all.  She is avoiding due to need to go to the bathroom.  All of these symptoms are chronic.  She has had issues with taking lasix as prescribed before.    Soiled underwear for about 2 weeks.  Daughter reports that this only happened when she was taking ex-lax for constipation, which she only takes occasionally.  If this continues, plan to keep a journal of circumstances around the occurrence and if she is taking medication at that time.  No back pain or urinary incontinence except when on lasix.  [name] does have some age related changes to her memory.  Her daughter assured me that this issue was in relation to taking laxatives.    Occasional blurry vision.  Reports she needs to see eye doctor.    Due to see Dr. Lovena Le with Cardiology in July.   Medications reviewed, not smoking    Medication List       This list is accurate as of: 09/17/14 11:19 AM.  Always use your most recent med list.               acetaminophen-codeine 300-30 MG per tablet  Commonly known as:  TYLENOL #3  Take 1 tablet by mouth every 6 (six) hours as needed for moderate pain.     aspirin EC 81 MG tablet  Take 81 mg by mouth daily.     diclofenac sodium 1 % Gel  Commonly known as:  VOLTAREN - not needed recently  Apply 2 g topically 4 (four) times daily as  needed (for pain).     furosemide 20 MG tablet  Commonly known as:  LASIX - taking only intermittently  Take 40 mg (2tablets) in the morning and take 20 mg (1 tablet) in the evening     lisinopril 20 MG tablet  Commonly known as:  PRINIVIL,ZESTRIL  TAKE 1 TABLET BY MOUTH DAILY     metoprolol 100 MG tablet  Commonly known as:  LOPRESSOR  Take 1 tablet (100 mg total) by mouth 2 (two) times daily.            solifenacin 10 MG tablet  Commonly known as:  VESICARE  Take 1 tablet (10 mg total) by mouth daily.        Review of Systems  Constitutional: Negative for chills and fatigue.  HENT: Negative for ear pain and facial swelling.   Eyes: Negative for photophobia and visual disturbance.  Respiratory: Positive for cough (chronic) and shortness of breath (with exertion). Negative for choking and chest tightness.   Cardiovascular: Positive for leg swelling (ankles). Negative for chest pain and palpitations.  Gastrointestinal: Positive for constipation. Negative for abdominal pain and blood in stool.  Genitourinary: Positive for frequency (with lasix). Negative for dysuria  and enuresis.  Musculoskeletal: Negative.   Skin: Negative for rash and wound.  Neurological: Positive for facial asymmetry (old) and weakness (chronic s/p stroke). Negative for light-headedness and numbness.       Objective:   Physical Exam  Constitutional: She is oriented to person, place, and time.  Thin, elderly woman in NAD.   HENT:  Head: Normocephalic and atraumatic.  Mouth/Throat: No oropharyngeal exudate.  Eyes: Conjunctivae are normal. Pupils are equal, round, and reactive to light. No scleral icterus.  Cardiovascular: Normal rate and regular rhythm.   No murmur heard. Pacemaker pocket without tenderness or redness  Pulmonary/Chest: Effort normal and breath sounds normal. No respiratory distress. She has no wheezes.  Abdominal: Soft. Bowel sounds are normal. She exhibits no distension.    Musculoskeletal: She exhibits edema (to mid calf bilaterally, 1+) and tenderness (at ankles).  Neurological: She is alert and oriented to person, place, and time.  Skin: Skin is warm and dry. No rash noted. No erythema.  Psychiatric: She has a normal mood and affect. Her behavior is normal.    No labs today.       Assessment & Plan:  RTC in 4 months for follow up.

## 2014-10-17 ENCOUNTER — Encounter: Payer: Self-pay | Admitting: Internal Medicine

## 2014-10-27 ENCOUNTER — Other Ambulatory Visit: Payer: Self-pay | Admitting: *Deleted

## 2014-10-27 MED ORDER — METOPROLOL TARTRATE 100 MG PO TABS: 100.0000 mg | ORAL_TABLET | Freq: Two times a day (BID) | ORAL | Status: DC

## 2014-11-05 ENCOUNTER — Encounter: Payer: Self-pay | Admitting: Internal Medicine

## 2014-11-05 ENCOUNTER — Ambulatory Visit (INDEPENDENT_AMBULATORY_CARE_PROVIDER_SITE_OTHER): Payer: Commercial Managed Care - HMO | Admitting: Internal Medicine

## 2014-11-05 VITALS — BP 130/72 | HR 70 | Temp 98.1°F | Wt 133.0 lb

## 2014-11-05 DIAGNOSIS — R159 Full incontinence of feces: Secondary | ICD-10-CM

## 2014-11-05 DIAGNOSIS — K6289 Other specified diseases of anus and rectum: Secondary | ICD-10-CM

## 2014-11-05 DIAGNOSIS — Z90710 Acquired absence of both cervix and uterus: Secondary | ICD-10-CM

## 2014-11-05 DIAGNOSIS — I5033 Acute on chronic diastolic (congestive) heart failure: Secondary | ICD-10-CM

## 2014-11-05 DIAGNOSIS — R600 Localized edema: Secondary | ICD-10-CM

## 2014-11-05 NOTE — Progress Notes (Signed)
   Subjective:    Patient ID: [name], female    DOB: 1925-05-01, 81 y.o.   MRN: [id number]  CC: Leg swelling  HPI  Ms. Renbarger is an 81yo woman with PMH of chronic diastolic CHF, Afib/sick sinus with PCM in place, h/o stroke, HTN, normocytic anemia, osteoporosis who presents for 3 weeks of leg swelling.    Last time I saw her, she reported a similar issue and was not taking her lasix as prescribed.  She was due to see the Cardiology team on 6/30 but missed the appointment.  She reports that she did increase her lasix to one tablet in the morning and one at night.  She has had persistent swelling in the legs that only partially improves with rest.  Her weight has also been steadily increasing, now at 133 today.  The swelling progresses to the knees.  She has no chest pain, SOB, dyspnea, cough, PND, orthopnea.    Another issues she reports today is persistent rectal incontinence.  At last visit, she had reported this, but it seemed to be temporally associated with taking a laxative.  Today, she reports that it has been persistent.  She never feels the urge to have a bowel movement, she has a soiling event once a day, sometimes at night, of solid stool which seems like a full bowel movement to her.  No blood present.  She had a hysterectomy 40 years ago, but no other surgeries.  She denies any other surgeries.  She has a history of 8 vaginal deliveries, however, current symptoms seem to be persistent for only a few months.   Review of Systems  Constitutional: Negative for fever, chills and activity change.  Respiratory: Negative for shortness of breath and wheezing.   Cardiovascular: Positive for leg swelling (to the knees bilaterally). Negative for chest pain and palpitations.  Gastrointestinal: Negative for nausea, vomiting, abdominal pain, diarrhea, constipation, blood in stool, abdominal distention and rectal pain.       + fecal incontinence, no blood in stool  Genitourinary: Negative  for dysuria, urgency, frequency, enuresis and difficulty urinating.  Musculoskeletal: Negative for back pain, arthralgias and gait problem.  Neurological: Negative for dizziness, syncope, weakness and light-headedness.       Objective:   Physical Exam  Constitutional: She is oriented to person, place, and time. She appears well-developed and well-nourished. No distress.  HENT:  Head: Normocephalic and atraumatic.  Eyes: Conjunctivae are normal. No scleral icterus.  Cardiovascular: Normal rate, regular rhythm, normal heart sounds and intact distal pulses.   Pulmonary/Chest: Effort normal and breath sounds normal.  Abdominal: Soft. Bowel sounds are normal. She exhibits no distension. There is no tenderness.  Genitourinary: Rectal exam shows anal tone abnormal. Rectal exam shows no external hemorrhoid, no fissure, no mass and no tenderness. Guaiac negative stool.  Neurological: She is alert and oriented to person, place, and time. Coordination normal.  Muscle strength 5/5 in legs, sensation normal.   + delayed anal wink.  Absent anal tone. No change in sensation to perineum.       Assessment & Plan:  RTC 3-4 months for chronic issues

## 2014-11-05 NOTE — Patient Instructions (Addendum)
Please schedule a follow up visit within the next 3-4 months for chronic issues.   For your medications:   Please bring all of your pill  Bottles with you to each visit.  This will help make sure that we have an up to date list of all the medications you are taking.  Please also bring any over the counter herbal medications you are taking (not including advil, tylenol, etc.)  Please increase your lasix to 2 tablets in the morning and 1 tablet in the afternoon for your swelling.  Please make a follow up appointment with your Cardiologist.     You will have a referral to see a GI doctor, please keep that appointment.   Based on my exam, I think you have something called passive fecal incontinence which could be due to an issue with the nerves of your anal sphincter (muscle that helps keep stool in).  Please discuss this with the GI doctor.   For your passage of stool today, please try to wear adult protection in your clothes to keep from soiling your clothes until a more definitive answer can be decided upon.    Thank you!

## 2014-11-06 DIAGNOSIS — R159 Full incontinence of feces: Secondary | ICD-10-CM | POA: Insufficient documentation

## 2014-11-06 DIAGNOSIS — K6289 Other specified diseases of anus and rectum: Principal | ICD-10-CM

## 2014-11-06 NOTE — Assessment & Plan Note (Signed)
I am concerned she has external anal sphincter weakness.  She denies back pain, change in leg strength, She does have chronic weakness in the legs, but strength was normal on exam, no acute changes, no neuropathy or leg pain except related to her swelling.  She had good feeling in the perineum and no saddle anesthesia.  From exam, she had good tension in the puborectalis muscles, but anal tone was low.  Unclear what could be causing this, she has had surgery (hysterectomy) and multiple vaginal deliveries in the past, but her symptoms are relatively new.   I have referred her to GI for possible manomatry vs. Defecography.  She has absolutely no diarrhea, these are well formed stools.  In the interim, I advised her to use adult diapers and to have timed toileting.

## 2014-11-06 NOTE — Assessment & Plan Note (Signed)
She continues to have LE edema to the knees.  Denies orthopnea, SOB, DOE, PND.  She is breathing comfortably in the room.   She has started taking 1 tablet of lasix BID, however, she has not noticed a difference in her urination or swelling.   I advised her to increase to the dose advised by her cardiologist which is 40mg  in the AM and 20 mg in the PM.  Her daughter in the room was present and will ensure this happens.

## 2014-11-13 ENCOUNTER — Encounter: Payer: Self-pay | Admitting: Gastroenterology

## 2014-11-15 ENCOUNTER — Other Ambulatory Visit: Payer: Self-pay | Admitting: Internal Medicine

## 2014-11-24 ENCOUNTER — Ambulatory Visit (INDEPENDENT_AMBULATORY_CARE_PROVIDER_SITE_OTHER): Payer: Commercial Managed Care - HMO | Admitting: *Deleted

## 2014-11-24 DIAGNOSIS — I4891 Unspecified atrial fibrillation: Secondary | ICD-10-CM | POA: Diagnosis not present

## 2014-11-24 DIAGNOSIS — Z950 Presence of cardiac pacemaker: Secondary | ICD-10-CM

## 2014-11-24 LAB — CUP PACEART INCLINIC DEVICE CHECK
Battery Impedance: 256 Ohm
Brady Statistic RV Percent Paced: 43 %
Date Time Interrogation Session: 20160808113309
Lead Channel Pacing Threshold Amplitude: 0.5 V
Lead Channel Pacing Threshold Pulse Width: 0.4 ms
Lead Channel Setting Pacing Amplitude: 2.5 V
Lead Channel Setting Pacing Pulse Width: 0.4 ms
Lead Channel Setting Sensing Sensitivity: 4 mV
MDC IDC MSMT BATTERY REMAINING LONGEVITY: 104 mo
MDC IDC MSMT BATTERY VOLTAGE: 2.79 V
MDC IDC MSMT LEADCHNL RA IMPEDANCE VALUE: 0 Ohm
MDC IDC MSMT LEADCHNL RV IMPEDANCE VALUE: 600 Ohm
MDC IDC MSMT LEADCHNL RV SENSING INTR AMPL: 11.2 mV

## 2014-11-24 NOTE — Progress Notes (Signed)
Pacemaker check in clinic. Normal device function. Threshold, sensing, impedance consistent with previous measurements. Device programmed to maximize longevity. 16 high ventricular rates noted- longest 10 seconds, pk V 187. H/o AF. Device programmed at appropriate safety margins. Histogram distribution appropriate for patient activity level. Device programmed to optimize intrinsic conduction. Estimated longevity 7-10 years. ROV with GT in January.

## 2014-11-25 ENCOUNTER — Ambulatory Visit: Payer: Commercial Managed Care - HMO | Admitting: Gastroenterology

## 2014-11-25 NOTE — Addendum Note (Signed)
Addended by: Hulan Fray on: 11/25/2014 07:36 PM   Modules accepted: Orders

## 2014-12-14 ENCOUNTER — Inpatient Hospital Stay (HOSPITAL_COMMUNITY)
Admission: EM | Admit: 2014-12-14 | Discharge: 2014-12-16 | DRG: 293 | Disposition: A | Discharge status: Home / Self Care | Payer: Commercial Managed Care - HMO | Attending: Student in an Organized Health Care Education/Training Program | Admitting: Student in an Organized Health Care Education/Training Program

## 2014-12-14 ENCOUNTER — Emergency Department (HOSPITAL_COMMUNITY): Payer: Commercial Managed Care - HMO

## 2014-12-14 ENCOUNTER — Encounter (HOSPITAL_COMMUNITY): Payer: Self-pay | Admitting: Emergency Medicine

## 2014-12-14 DIAGNOSIS — Z79899 Other long term (current) drug therapy: Secondary | ICD-10-CM

## 2014-12-14 DIAGNOSIS — Z9119 Patient's noncompliance with other medical treatment and regimen: Secondary | ICD-10-CM | POA: Diagnosis present

## 2014-12-14 DIAGNOSIS — I129 Hypertensive chronic kidney disease with stage 1 through stage 4 chronic kidney disease, or unspecified chronic kidney disease: Secondary | ICD-10-CM | POA: Diagnosis not present

## 2014-12-14 DIAGNOSIS — Z7982 Long term (current) use of aspirin: Secondary | ICD-10-CM | POA: Diagnosis not present

## 2014-12-14 DIAGNOSIS — I5033 Acute on chronic diastolic (congestive) heart failure: Secondary | ICD-10-CM | POA: Diagnosis not present

## 2014-12-14 DIAGNOSIS — I5021 Acute systolic (congestive) heart failure: Secondary | ICD-10-CM

## 2014-12-14 DIAGNOSIS — Z7901 Long term (current) use of anticoagulants: Secondary | ICD-10-CM | POA: Diagnosis not present

## 2014-12-14 DIAGNOSIS — I69392 Facial weakness following cerebral infarction: Secondary | ICD-10-CM | POA: Diagnosis not present

## 2014-12-14 DIAGNOSIS — Z950 Presence of cardiac pacemaker: Secondary | ICD-10-CM

## 2014-12-14 DIAGNOSIS — N3946 Mixed incontinence: Secondary | ICD-10-CM | POA: Diagnosis present

## 2014-12-14 DIAGNOSIS — N183 Chronic kidney disease, stage 3 unspecified: Secondary | ICD-10-CM | POA: Diagnosis present

## 2014-12-14 DIAGNOSIS — J449 Chronic obstructive pulmonary disease, unspecified: Secondary | ICD-10-CM | POA: Diagnosis not present

## 2014-12-14 DIAGNOSIS — T502X5A Adverse effect of carbonic-anhydrase inhibitors, benzothiadiazides and other diuretics, initial encounter: Secondary | ICD-10-CM | POA: Diagnosis present

## 2014-12-14 DIAGNOSIS — I509 Heart failure, unspecified: Secondary | ICD-10-CM

## 2014-12-14 DIAGNOSIS — I5043 Acute on chronic combined systolic (congestive) and diastolic (congestive) heart failure: Secondary | ICD-10-CM | POA: Diagnosis not present

## 2014-12-14 DIAGNOSIS — E119 Type 2 diabetes mellitus without complications: Secondary | ICD-10-CM | POA: Diagnosis present

## 2014-12-14 DIAGNOSIS — M810 Age-related osteoporosis without current pathological fracture: Secondary | ICD-10-CM | POA: Diagnosis present

## 2014-12-14 DIAGNOSIS — R0602 Shortness of breath: Secondary | ICD-10-CM | POA: Diagnosis not present

## 2014-12-14 DIAGNOSIS — I5042 Chronic combined systolic (congestive) and diastolic (congestive) heart failure: Secondary | ICD-10-CM | POA: Diagnosis present

## 2014-12-14 DIAGNOSIS — Z8679 Personal history of other diseases of the circulatory system: Secondary | ICD-10-CM

## 2014-12-14 DIAGNOSIS — Z9181 History of falling: Secondary | ICD-10-CM

## 2014-12-14 DIAGNOSIS — J984 Other disorders of lung: Secondary | ICD-10-CM | POA: Diagnosis not present

## 2014-12-14 DIAGNOSIS — I10 Essential (primary) hypertension: Secondary | ICD-10-CM | POA: Diagnosis present

## 2014-12-14 DIAGNOSIS — I4891 Unspecified atrial fibrillation: Secondary | ICD-10-CM | POA: Diagnosis present

## 2014-12-14 DIAGNOSIS — R296 Repeated falls: Secondary | ICD-10-CM | POA: Diagnosis not present

## 2014-12-14 DIAGNOSIS — Z8673 Personal history of transient ischemic attack (TIA), and cerebral infarction without residual deficits: Secondary | ICD-10-CM | POA: Diagnosis not present

## 2014-12-14 LAB — BASIC METABOLIC PANEL WITH GFR
Anion gap: 11 (ref 5–15)
BUN: 22 mg/dL — ABNORMAL HIGH (ref 6–20)
CO2: 20 mmol/L — ABNORMAL LOW (ref 22–32)
Calcium: 9.4 mg/dL (ref 8.9–10.3)
Chloride: 104 mmol/L (ref 101–111)
Creatinine, Ser: 1.44 mg/dL — ABNORMAL HIGH (ref 0.44–1.00)
GFR calc Af Amer: 37 mL/min — ABNORMAL LOW
GFR calc non Af Amer: 32 mL/min — ABNORMAL LOW
Glucose, Bld: 71 mg/dL (ref 65–99)
Potassium: 4.4 mmol/L (ref 3.5–5.1)
Sodium: 135 mmol/L (ref 135–145)

## 2014-12-14 LAB — CBC WITH DIFFERENTIAL/PLATELET
Basophils Absolute: 0 K/uL (ref 0.0–0.1)
Basophils Relative: 1 % (ref 0–1)
Eosinophils Absolute: 0.1 K/uL (ref 0.0–0.7)
Eosinophils Relative: 1 % (ref 0–5)
HCT: 34.8 % — ABNORMAL LOW (ref 36.0–46.0)
Hemoglobin: 12.2 g/dL (ref 12.0–15.0)
Lymphocytes Relative: 31 % (ref 12–46)
Lymphs Abs: 1.3 K/uL (ref 0.7–4.0)
MCH: 31.9 pg (ref 26.0–34.0)
MCHC: 35.1 g/dL (ref 30.0–36.0)
MCV: 91.1 fL (ref 78.0–100.0)
Monocytes Absolute: 0.5 K/uL (ref 0.1–1.0)
Monocytes Relative: 12 % (ref 3–12)
Neutro Abs: 2.4 K/uL (ref 1.7–7.7)
Neutrophils Relative %: 55 % (ref 43–77)
Platelets: 121 K/uL — ABNORMAL LOW (ref 150–400)
RBC: 3.82 MIL/uL — ABNORMAL LOW (ref 3.87–5.11)
RDW: 15.6 % — ABNORMAL HIGH (ref 11.5–15.5)
WBC: 4.3 K/uL (ref 4.0–10.5)

## 2014-12-14 LAB — I-STAT TROPONIN, ED: Troponin i, poc: 0.02 ng/mL (ref 0.00–0.08)

## 2014-12-14 LAB — BRAIN NATRIURETIC PEPTIDE: B Natriuretic Peptide: 2476.8 pg/mL — ABNORMAL HIGH (ref 0.0–100.0)

## 2014-12-14 MED ORDER — SODIUM CHLORIDE 0.9 % IJ SOLN
3.0000 mL | Freq: Two times a day (BID) | INTRAMUSCULAR | Status: DC
  Administered 2014-12-14 – 2014-12-16 (×3): 3 mL via INTRAVENOUS

## 2014-12-14 MED ORDER — DARIFENACIN HYDROBROMIDE ER 15 MG PO TB24
15.0000 mg | ORAL_TABLET | Freq: Every day | ORAL | Status: DC
  Administered 2014-12-15 – 2014-12-16 (×2): 15 mg via ORAL
  Filled 2014-12-14 (×2): qty 1

## 2014-12-14 MED ORDER — SODIUM CHLORIDE 0.9 % IV SOLN: 250.0000 mL | INTRAVENOUS | Status: DC | PRN

## 2014-12-14 MED ORDER — ASPIRIN EC 81 MG PO TBEC
81.0000 mg | DELAYED_RELEASE_TABLET | Freq: Every day | ORAL | Status: DC
  Administered 2014-12-14 – 2014-12-16 (×3): 81 mg via ORAL
  Filled 2014-12-14 (×2): qty 1

## 2014-12-14 MED ORDER — SODIUM CHLORIDE 0.9 % IJ SOLN: 3.0000 mL | INTRAMUSCULAR | Status: DC | PRN

## 2014-12-14 MED ORDER — METOPROLOL TARTRATE 100 MG PO TABS
100.0000 mg | ORAL_TABLET | Freq: Two times a day (BID) | ORAL | Status: DC
  Administered 2014-12-14 – 2014-12-16 (×4): 100 mg via ORAL
  Filled 2014-12-14 (×5): qty 1

## 2014-12-14 MED ORDER — SODIUM CHLORIDE 0.9 % IJ SOLN
3.0000 mL | Freq: Two times a day (BID) | INTRAMUSCULAR | Status: DC
  Administered 2014-12-14: 3 mL via INTRAVENOUS

## 2014-12-14 MED ORDER — FUROSEMIDE 10 MG/ML IJ SOLN
40.0000 mg | Freq: Two times a day (BID) | INTRAMUSCULAR | Status: DC
  Administered 2014-12-14 – 2014-12-16 (×4): 40 mg via INTRAVENOUS
  Filled 2014-12-14 (×4): qty 4

## 2014-12-14 MED ORDER — ENOXAPARIN SODIUM 30 MG/0.3ML ~~LOC~~ SOLN
30.0000 mg | SUBCUTANEOUS | Status: DC
  Administered 2014-12-14 – 2014-12-15 (×2): 30 mg via SUBCUTANEOUS
  Filled 2014-12-14 (×2): qty 0.3

## 2014-12-14 MED ORDER — LISINOPRIL 20 MG PO TABS
20.0000 mg | ORAL_TABLET | Freq: Every day | ORAL | Status: DC
  Administered 2014-12-14 – 2014-12-16 (×3): 20 mg via ORAL
  Filled 2014-12-14 (×2): qty 1

## 2014-12-14 MED ORDER — FUROSEMIDE 10 MG/ML IJ SOLN
40.0000 mg | Freq: Once | INTRAMUSCULAR | Status: AC
  Administered 2014-12-14: 40 mg via INTRAVENOUS
  Filled 2014-12-14: qty 4

## 2014-12-14 NOTE — ED Notes (Signed)
Attempted report 

## 2014-12-14 NOTE — ED Notes (Signed)
Hospitalist at bedside 

## 2014-12-14 NOTE — ED Notes (Signed)
MD at bedside. 

## 2014-12-14 NOTE — ED Notes (Signed)
Admitting at bedside 

## 2014-12-14 NOTE — ED Notes (Signed)
Patient returned from X-ray 

## 2014-12-14 NOTE — ED Provider Notes (Signed)
CSN: [id number]     Arrival date & time 12/14/14  1110 History   First MD Initiated Contact with Patient 12/14/14 1145     Chief Complaint  Patient presents with  . Shortness of Breath  . Weakness     (Consider location/radiation/quality/duration/timing/severity/associated sxs/prior Treatment) HPI Comments: Patient with a history of atrial fibrillation and congestive heart failure presents with shortness of breath. She also has a pacemaker in place. I don't see that she is on anticoagulants. She is a poor historian. She states that she's had some increased shortness breath for the last week but it was much worse today. She's only able to walk short distances without getting short of breath and then she has to rest. She's also had increased fatigue. She denies any chest pain but does report some tightness to the center of her chest, mostly on ambulation. She denies any cough or fevers. She does report some increase in her pedal edema. She denies abdominal pain. There is no nausea vomiting or change in bowel habits. She does take Lasix at home. She is noncompliant with her Lasix because she doesn't like the way it makes her urinate frequently. She does say that she's been taking it every day this week but she is only taking 20 mg once a day versus her recommended dose of 40 mg in the morning and 20 mg in the evening. She was seen in internal medicine outpatient clinic. Her cardiologist is Dr. Lovena Le.  Patient is a 81 y.o. female presenting with shortness of breath and weakness.  Shortness of Breath Associated symptoms: no abdominal pain, no chest pain, no cough, no diaphoresis, no fever, no headaches, no rash and no vomiting   Weakness Associated symptoms include shortness of breath. Pertinent negatives include no chest pain, no abdominal pain and no headaches.    Past Medical History  Diagnosis Date  . Weight loss, unintentional 2010-2011    8/08-8/10 138-146 lbs, between 8/10-10/11 lost 18  lbs., work-up negative  . Incontinence overflow, stress female     on vesicare  . History of sick sinus syndrome s/p pacemaker Dr. Lovena Le 2000  . Atrial fibrillation     on coumadin followed by Dr. Elie Confer  . History of vaginal bleeding 05/2005    nagative endometrial biopsy  . Restrictive lung disease 1996    PFT's showed mild disease  . Anemia     Iron deficiency, does not tolerate po  . CHF (congestive heart failure)     diastolic dysfunction  . Hypertension     well controlled on 3 agents  . Osteoporosis   . Allergic rhinitis   . Onychomycosis   . History of trichomonal urethritis   . Constipation   . Diverticulosis of colon (without mention of hemorrhage)   . Pacemaker   . Type II diabetes mellitus     diet controlled  . Stroke 2002    R MCA, cardioembolic  . Arthritis     "all over my body"  . DJD (degenerative joint disease)    Past Surgical History  Procedure Laterality Date  . Cholecystectomy    . Cataract extraction    . Abdominal hysterectomy    . Pacemaker generator change  10/2012    AdaptaL 10/2012 by Dr Lovena Le  . Insert / replace / remove pacemaker  2000    MDT Sigma pacemaker implanted by Dr Lovena Le;  . Pacemaker generator change N/A 11/02/2012    Procedure: PACEMAKER GENERATOR CHANGE;  Surgeon: Evans Lance,  MD;  Location: [facility];  Service: Cardiovascular;  Laterality: N/A;   Family History  Problem Relation Age of Onset  . Diabetes Sister   . Heart disease     Social History  Substance Use Topics  . Smoking status: Never Smoker   . Smokeless tobacco: Never Used  . Alcohol Use: 0.0 oz/week    0 Standard drinks or equivalent per week     Comment: occasionally.   OB History    No data available     Review of Systems  Constitutional: Positive for fatigue. Negative for fever, chills and diaphoresis.  HENT: Negative for congestion, rhinorrhea and sneezing.   Eyes: Negative.   Respiratory: Positive for chest tightness and shortness of breath.  Negative for cough.   Cardiovascular: Positive for leg swelling. Negative for chest pain.  Gastrointestinal: Negative for nausea, vomiting, abdominal pain, diarrhea and blood in stool.  Genitourinary: Negative for frequency, hematuria, flank pain and difficulty urinating.  Musculoskeletal: Negative for back pain and arthralgias.  Skin: Negative for rash.  Neurological: Positive for weakness. Negative for dizziness, speech difficulty, numbness and headaches.      Allergies  Review of patient's allergies indicates no known allergies.  Home Medications   Prior to Admission medications   Medication Sig Start Date End Date Taking? Authorizing Provider  aspirin EC 81 MG tablet Take 81 mg by mouth daily.   Yes Historical Provider, MD  diclofenac sodium (VOLTAREN) 1 % GEL Apply 2 g topically 4 (four) times daily as needed (for pain).    Yes Historical Provider, MD  furosemide (LASIX) 20 MG tablet Take 40 mg (2tablets) in the morning and take 20 mg (1 tablet) in the evening Patient taking differently: Take 20-40 mg by mouth 2 (two) times daily. Take 40 mg (2 tablets) by mouth in the morning and take 20 mg (1 tablet) by mouth in the evening 01/31/14  Yes Jessee Avers, MD  lisinopril (PRINIVIL,ZESTRIL) 20 MG tablet TAKE 1 TABLET BY MOUTH DAILY 07/01/14  Yes Sid Falcon, MD  metoprolol (LOPRESSOR) 100 MG tablet Take 1 tablet (100 mg total) by mouth 2 (two) times daily. 10/27/14  Yes Sid Falcon, MD  potassium chloride (MICRO-K) 10 MEQ CR capsule Take 10 mEq by mouth daily.  05/26/14  Yes Historical Provider, MD  solifenacin (VESICARE) 10 MG tablet Take 1 tablet (10 mg total) by mouth daily. 02/27/14  Yes Sid Falcon, MD   BP 143/72 mmHg  Pulse 61  Temp(Src) 98.3 F (36.8 C) (Oral)  Resp 18  Ht 5' (1.524 m)  Wt 123 lb 5 oz (55.934 kg)  BMI 24.08 kg/m2  SpO2 98% Physical Exam  Constitutional: She is oriented to person, place, and time. She appears well-developed and well-nourished.   HENT:  Head: Normocephalic and atraumatic.  Eyes: Pupils are equal, round, and reactive to light.  Neck: Normal range of motion. Neck supple.  Cardiovascular: Normal rate and regular rhythm.   Murmur heard. Pulmonary/Chest: Effort normal. No respiratory distress. She has no wheezes. She has rales. She exhibits no tenderness.  Abdominal: Soft. Bowel sounds are normal. There is no tenderness. There is no rebound and no guarding.  Musculoskeletal: Normal range of motion. She exhibits edema (2+ bilateral pitting edema).  Lymphadenopathy:    She has no cervical adenopathy.  Neurological: She is alert and oriented to person, place, and time.  Skin: Skin is warm and dry. No rash noted.  Psychiatric: She has a normal mood and affect.  ED Course  Procedures (including critical care time) Labs Review Labs Reviewed  BASIC METABOLIC PANEL - Abnormal; Notable for the following:    CO2 20 (*)    BUN 22 (*)    Creatinine, Ser 1.44 (*)    GFR calc non Af Amer 32 (*)    GFR calc Af Amer 37 (*)    All other components within normal limits  CBC WITH DIFFERENTIAL/PLATELET - Abnormal; Notable for the following:    RBC 3.82 (*)    HCT 34.8 (*)    RDW 15.6 (*)    Platelets 121 (*)    All other components within normal limits  BRAIN NATRIURETIC PEPTIDE - Abnormal; Notable for the following:    B Natriuretic Peptide 2476.8 (*)    All other components within normal limits  I-STAT TROPOININ, ED    Imaging Review Dg Chest 2 View  12/14/2014   CLINICAL DATA:  Shortness of breath, weakness for 1 week.  EXAM: CHEST  2 VIEW  COMPARISON:  01/21/2014  FINDINGS: There is cardiomegaly. Left pacer remains in place, unchanged. There is hyperinflation of the lungs compatible with COPD. No confluent opacities or effusions. No acute bony abnormality.  IMPRESSION: Cardiomegaly, COPD.  No active disease.   Electronically Signed   By: Rolm Baptise M.D.   On: 12/14/2014 12:35   I have personally reviewed and  evaluated these images and lab results as part of my medical decision-making.   EKG Interpretation   Date/Time:  Sunday December 14 2014 11:27:15 EDT Ventricular Rate:  62 PR Interval:    QRS Duration: 170 QT Interval:  490 QTC Calculation: 497 R Axis:   -68 Text Interpretation:  Ventricular-paced rhythm Abnormal ECG Confirmed by  Kearston Putman  MD, Georgena Weisheit ([id number]) on 12/14/2014 12:10:13 PM      MDM   Final diagnoses:  Acute systolic congestive heart failure    Patient presents with increased shortness of breath and chest pain on exertion. Her EKG shows a paced rhythm. Her troponin is negative. There is no evidence of pulmonary edema. However her BNP is markedly elevated. I feel like she likely needs some IV diuresis. She was given a dose of IV Lasix in the ED. I spoke with Dr. Radford Pax with cardiology who recommends medicine to admit the patient. I will consult the internal medicine teaching service for admission.    Malvin Johns, MD 12/14/14 [phone]

## 2014-12-14 NOTE — H&P (Signed)
Date: 12/14/2014               Patient Name:  [name] MRN: [id number]  DOB: 07-20-1925 Age / Sex: 81 y.o., female   PCP: Holly Falcon, MD         Medical Service: Internal Medicine Teaching Service         Attending Physician: Holly. Axel Filler, MD    First Contact: Holly. Burgess Duran Pager: [phone]  Second Contact: Holly. Joni Duran Pager: [phone]       After Hours (After 5p/  First Contact Pager: [phone]  weekends / holidays): Second Contact Pager: [phone]   Chief Complaint: shortness of breath   History of Present Illness:  [name] is a very pleasant 81 yo woman with PMH of chronic diastolic failure with EF 03-47% in 2014, Afib/sick sinus with PCM in place since 2000, HTN, CVA with resulting right sided facial droop, normocytic anemia, stress and urge incontinence, and osteoporosis, who is here for a 1 week history of SOB. She is able to walk short distances but has progressively been getting more SOB and feels more fatigued.  She also reports increased edema of the leg. She has stable 2 pillow orthopnea and some PND. She eats low salt diet, and does fluid restriction- drinks about 2 8-12 oz bottlers of water per day.  Per cardiology and [name] note, her home dose of lasix is 40 mg AM and 20 mg PM, but she has only been taking one 20 mg pill every day last week, as she says that it makes her urinate more frequently. Her cardiologist is [name]. Daughters present at bedsidde who reports Her dry weight to be about 124 lbs.    She does not report fevers, or other constitutional symptoms, and no n/v, abd pain, or change in bowel habits or cough.    She also frequently has falls- last fall was last year. I am a little concerned as she lives by herself and she only uses a cane. I believe she will be much benefitted by a 4 wheeled walker, and we will ask PT to evaluate their recommendations.   Regarding her anticoag, she is only on aspirin and not any other  anticoag, she was previously on coumadin , but according to the daughter it was stopped because her cardiologist thought she was at a high fall risk, and INR was difficult to be therapeutic. She was following [name] and he had recommended a newer agents and they had opted not to at that time.    In the ER, vitals stable, satting 98% on ra, and afebrile. She was given a dose of iv lasix and admit for further diuresis.     Meds: Current Facility-Administered Medications  Medication Dose Route Frequency Provider Last Rate Last Dose  . 0.9 %  sodium chloride infusion  250 mL Intravenous PRN Holly Groves, DO      . aspirin EC tablet 81 mg  81 mg Oral Daily Holly Groves, DO   81 mg at 12/14/14 1615  . [START ON 12/15/2014] darifenacin (ENABLEX) 24 hr tablet 15 mg  15 mg Oral Daily Holly Groves, DO      . enoxaparin (LOVENOX) injection 30 mg  30 mg Subcutaneous Q24H Holly Groves, DO   30 mg at 12/14/14 1800  . furosemide (LASIX) injection 40 mg  40 mg Intravenous BID Holly Groves, DO   40 mg at 12/14/14 1800  .  lisinopril (PRINIVIL,ZESTRIL) tablet 20 mg  20 mg Oral Daily Holly Groves, DO   20 mg at 12/14/14 1615  . metoprolol (LOPRESSOR) tablet 100 mg  100 mg Oral BID Holly Groves, DO      . sodium chloride 0.9 % injection 3 mL  3 mL Intravenous Q12H Holly Groves, DO      . sodium chloride 0.9 % injection 3 mL  3 mL Intravenous Q12H Holly Groves, DO      . sodium chloride 0.9 % injection 3 mL  3 mL Intravenous PRN Holly Groves, DO        Allergies: Allergies as of 12/14/2014  . (No Known Allergies)   Past Medical History  Diagnosis Date  . Weight loss, unintentional 2010-2011    8/08-8/10 138-146 lbs, between 8/10-10/11 lost 18 lbs., work-up negative  . Incontinence overflow, stress female     on vesicare  . History of sick sinus syndrome s/p pacemaker [name] 2000  . Atrial fibrillation     on coumadin followed by Holly. Elie Duran  . History of vaginal bleeding 05/2005     nagative endometrial biopsy  . Restrictive lung disease 1996    PFT's showed mild disease  . Anemia     Iron deficiency, does not tolerate po  . CHF (congestive heart failure)     diastolic dysfunction  . Hypertension     well controlled on 3 agents  . Osteoporosis   . Allergic rhinitis   . Onychomycosis   . History of trichomonal urethritis   . Constipation   . Diverticulosis of colon (without mention of hemorrhage)   . Pacemaker   . Type II diabetes mellitus     diet controlled  . Stroke 2002    R MCA, cardioembolic  . Arthritis     "all over my body"  . DJD (degenerative joint disease)    Past Surgical History  Procedure Laterality Date  . Cholecystectomy    . Cataract extraction    . Abdominal hysterectomy    . Pacemaker generator change  10/2012    AdaptaL 10/2012 by Holly Lovena Duran  . Insert / replace / remove pacemaker  2000    MDT Sigma pacemaker implanted by Holly Lovena Duran;  . Pacemaker generator change N/A 11/02/2012    Procedure: PACEMAKER GENERATOR CHANGE;  Surgeon: Holly Lance, MD;  Location: [facility] CATH LAB;  Service: Cardiovascular;  Laterality: N/A;   Family History  Problem Relation Age of Onset  . Diabetes Sister   . Heart disease     Social History   Social History  . Marital Status: Widowed    Spouse Name: N/A  . Number of Children: 8  . Years of Education: N/A   Occupational History  . retired    Social History Main Topics  . Smoking status: Never Smoker   . Smokeless tobacco: Never Used  . Alcohol Use: 0.0 oz/week    0 Standard drinks or equivalent per week     Comment: occasionally.  . Drug Use: No  . Sexual Activity: No   Other Topics Concern  . Not on file   Social History Narrative   PPM-[employer]   [place] June home# [phone]   Cell# [phone]      Patient lives alone, but has 7, of 8, living children who check in on her.   She is widowed.    Review of Systems: Kindly see hpi, others reviewed and unremarkable  Physical Exam: Blood  pressure 144/58, pulse 65, temperature 98 F (36.7 C), temperature source Oral, resp. rate 22, height 5' (1.524 m), weight 122 lb 8.5 oz (55.58 kg), SpO2 98 %.  General: A&O, lying in bed, thin, has right facial droop from residual effects of prior strokes HEENT: EOMI,   Neck: supple, midline trachea, prominent veins CV: RRR, normal s1, s2, no m/r/g, no carotid bruits appreciated, no JVD appreciated, has an ICD placed Resp: equal and symmetric breath sounds, no rales or wheezing Abdomen: soft, nontender, nondistended, +BS in all 4 quadrants,  GU: no CVA tenderness Skin: warm and well perfused , dry, intact, no open lesions or rashes noted Extremities: pulses intact b/l, no edema, clubbing or cyanosis, no calf tenderness Neurologic: no focal neuro deficits, but has residual right sided facial droop   Lab results: Results for orders placed or performed during the hospital encounter of 12/14/14 (from the past 24 hour(s))  Basic metabolic panel     Status: Abnormal   Collection Time: 12/14/14 12:30 PM  Result Value Ref Range   Sodium 135 135 - 145 mmol/L   Potassium 4.4 3.5 - 5.1 mmol/L   Chloride 104 101 - 111 mmol/L   CO2 20 (L) 22 - 32 mmol/L   Glucose, Bld 71 65 - 99 mg/dL   BUN 22 (H) 6 - 20 mg/dL   Creatinine, Ser 1.44 (H) 0.44 - 1.00 mg/dL   Calcium 9.4 8.9 - 10.3 mg/dL   GFR calc non Af Amer 32 (L) >60 mL/min   GFR calc Af Amer 37 (L) >60 mL/min   Anion gap 11 5 - 15  CBC with Differential     Status: Abnormal   Collection Time: 12/14/14 12:30 PM  Result Value Ref Range   WBC 4.3 4.0 - 10.5 K/uL   RBC 3.82 (L) 3.87 - 5.11 MIL/uL   Hemoglobin 12.2 12.0 - 15.0 g/dL   HCT 34.8 (L) 36.0 - 46.0 %   MCV 91.1 78.0 - 100.0 fL   MCH 31.9 26.0 - 34.0 pg   MCHC 35.1 30.0 - 36.0 g/dL   RDW 15.6 (H) 11.5 - 15.5 %   Platelets 121 (L) 150 - 400 K/uL   Neutrophils Relative % 55 43 - 77 %   Neutro Abs 2.4 1.7 - 7.7 K/uL   Lymphocytes Relative 31 12 - 46 %   Lymphs Abs 1.3 0.7 - 4.0  K/uL   Monocytes Relative 12 3 - 12 %   Monocytes Absolute 0.5 0.1 - 1.0 K/uL   Eosinophils Relative 1 0 - 5 %   Eosinophils Absolute 0.1 0.0 - 0.7 K/uL   Basophils Relative 1 0 - 1 %   Basophils Absolute 0.0 0.0 - 0.1 K/uL  Brain natriuretic peptide     Status: Abnormal   Collection Time: 12/14/14 12:31 PM  Result Value Ref Range   B Natriuretic Peptide 2476.8 (H) 0.0 - 100.0 pg/mL  I-stat troponin, ED     Status: None   Collection Time: 12/14/14 12:53 PM  Result Value Ref Range   Troponin i, poc 0.02 0.00 - 0.08 ng/mL   Comment 3             Imaging results:  Dg Chest 2 View  12/14/2014   CLINICAL DATA:  Shortness of breath, weakness for 1 week.  EXAM: CHEST  2 VIEW  COMPARISON:  01/21/2014  FINDINGS: There is cardiomegaly. Left pacer remains in place, unchanged. There is hyperinflation of the lungs compatible with COPD. No  confluent opacities or effusions. No acute bony abnormality.  IMPRESSION: Cardiomegaly, COPD.  No active disease.   Electronically Signed   By: Rolm Baptise M.D.   On: 12/14/2014 12:35     Assessment & Plan by Problem: Principal Problem:   Acute on chronic diastolic congestive heart failure Active Problems:   Hypertension   Atrial fibrillation, controlled   History of sick sinus syndrome   Pacemaker-Medtronic   CKD (chronic kidney disease) stage 3, GFR 30-59 ml/min  Acute on chronic diastolic CHF with EF of 91-79%: History and presentation with orthopnea and PND suggests acute CHF exacerbation. Lungs were clear to auscultation. CXR showed moderate cardiomegaly. Her BNP was quite elevated at 2400 making this diagnosis more likely, but again BNP is not a diagnostic criteria, it is clinical evaluation.Troponins negative. EKG shows ventricular paced rhythm, unchanged from the one compared to 10/2012.  Her last echo in 2014 shows EF of 50-55%. Patient was not compliant with her home lasix dose of 40 mg AM and 20 mg PM and that was the likely precipitating factor.  Also has a lot of risk factors that could precipitate ADHF like afib etc.   -In the ER, Given one time dose of 80 mg IV lasix -lasix 40 mg bid -repeat echo -on lisinopril 20 mg daily, and metoprolol 100 mg bid   Incontinence: mixed stress and urge incontinence, has been evaluated in the clinic for this, is due to see GI -on darifenacin   History of CVA in 2002: she is on aspirin Her CHADVASC score is 7, indicating the need for anticoagulation. But it was stopped recently.  -We will need to re-assess.  Falls: Pt has history of multiple falls. Rodney Langton uses a cane and lives by herself -will need walker, will consult PT     Dispo: Disposition is deferred at this time, awaiting improvement of current medical problems. Anticipated discharge in approximately 3 day(s).   The patient does have a current PCP Holly Falcon, MD) and does need an [facility] hospital follow-up appointment after discharge.  The patient does not have transportation limitations that hinder transportation to clinic appointments.  Signed: Burgess Estelle, MD 12/14/2014, 5:30 PM

## 2014-12-14 NOTE — ED Notes (Signed)
Daughter stated, she's been Sob AND SOME WEAKNESS FOR ABOUT A WEEK.  SHE USUALLY GOES DOWNSTAIRS.

## 2014-12-15 DIAGNOSIS — Z9181 History of falling: Secondary | ICD-10-CM

## 2014-12-15 DIAGNOSIS — R32 Unspecified urinary incontinence: Secondary | ICD-10-CM

## 2014-12-15 DIAGNOSIS — I5033 Acute on chronic diastolic (congestive) heart failure: Secondary | ICD-10-CM

## 2014-12-15 DIAGNOSIS — Z8673 Personal history of transient ischemic attack (TIA), and cerebral infarction without residual deficits: Secondary | ICD-10-CM

## 2014-12-15 DIAGNOSIS — R296 Repeated falls: Secondary | ICD-10-CM

## 2014-12-15 DIAGNOSIS — Z7982 Long term (current) use of aspirin: Secondary | ICD-10-CM

## 2014-12-15 LAB — BASIC METABOLIC PANEL
Anion gap: 7 (ref 5–15)
BUN: 23 mg/dL — AB (ref 6–20)
CHLORIDE: 104 mmol/L (ref 101–111)
CO2: 25 mmol/L (ref 22–32)
Calcium: 9.1 mg/dL (ref 8.9–10.3)
Creatinine, Ser: 1.39 mg/dL — ABNORMAL HIGH (ref 0.44–1.00)
GFR calc Af Amer: 38 mL/min — ABNORMAL LOW (ref >60)
GFR calc non Af Amer: 33 mL/min — ABNORMAL LOW (ref >60)
GLUCOSE: 139 mg/dL — AB (ref 65–99)
POTASSIUM: 3.8 mmol/L (ref 3.5–5.1)
Sodium: 136 mmol/L (ref 135–145)

## 2014-12-15 NOTE — Progress Notes (Signed)
Occupational Therapy Evaluation Patient Details Name: [name] MRN: [id number] DOB: 10/03/1925 Today's Date: 12/15/2014    History of Present Illness 81 y.o, female with h/o a fib, pacemaker, DM,CHF, CVA admitted with SOB.    Clinical Impression   PTA, pt lived alone and has daughters spend the night and check on her daily. Pt overall at baseline regarding ADL and mobility. Completed education regarding reducing risk of falls and maximizing functional level of independence. OT signing off. Pt will need a 3 in 1 and RW for D/C.     Follow Up Recommendations  No OT follow up;Supervision - Intermittent    Equipment Recommendations  3 in 1 bedside comode;Other (comment) (RW)    Recommendations for Other Services       Precautions / Restrictions Precautions Precautions: Fall Precaution Comments: pt denies falls in past 1 year Restrictions Weight Bearing Restrictions: No      Mobility Bed Mobility Overal bed mobility: Independent                Transfers Overall transfer level: Independent                    Balance Overall balance assessment: No apparent balance deficits (not formally assessed)        No LOB noted during functional tasks.                                  ADL Overall ADL's : At baseline                                       General ADL Comments: educated pt and daughter on fall prevention/reducing risk of falls with home modifications and use of DME. Recommended use of 3 in 1 by the bed at night and for the caugher to place the 3 in 1 in the shower for bathing. Pt/daughter verbalized understanding.      Vision     Perception     Praxis      Pertinent Vitals/Pain Pain Assessment: No/denies pain     Hand Dominance     Extremity/Trunk Assessment Upper Extremity Assessment Upper Extremity Assessment: Overall WFL for tasks assessed   Lower Extremity Assessment Lower Extremity Assessment:  Overall WFL for tasks assessed   Cervical / Trunk Assessment Cervical / Trunk Assessment: Normal   Communication Communication Communication: No difficulties   Cognition Arousal/Alertness: Awake/alert Behavior During Therapy: WFL for tasks assessed/performed Overall Cognitive Status: Within Functional Limits for tasks assessed                     General Comments       Exercises       Shoulder Instructions      Home Living Family/patient expects to be discharged to:: Private residence Living Arrangements: Alone (daughter stays with her "most nights") Available Help at Discharge: Family;Available PRN/intermittently Type of Home: House Home Access: Stairs to enter [employer] of Steps: 2 Entrance Stairs-Rails: Right Home Layout: One level     Bathroom Shower/Tub: Occupational psychologist: Standard Bathroom Accessibility: Yes How Accessible: Accessible via walker Home Equipment: [place] - single point;Grab bars - tub/shower   Additional Comments: daughter stays with her every night. daughters come by daily      Prior Functioning/Environment Level of Independence: Independent  with assistive device(s)        Comments: uses cane when going out, independent with bathing/dressing, doesn't drive, daughters assist with transportation    OT Diagnosis: Generalized weakness   OT Problem List:     OT Treatment/Interventions:      OT Goals(Current goals can be found in the care plan section) Acute Rehab OT Goals Patient Stated Goal: go home OT Goal Formulation: All assessment and education complete, DC therapy  OT Frequency:     Barriers to D/C:            Co-evaluation              End of Session Equipment Utilized During Treatment: Gait belt Nurse Communication: Mobility status  Activity Tolerance: Patient tolerated treatment well Patient left: in bed;with call bell/phone within reach;with family/visitor present   Time:  1173-5670 OT Time Calculation (min): 19 min Charges:  OT General Charges $OT Visit: 1 Procedure OT Evaluation $Initial OT Evaluation Tier I: 1 Procedure G-Codes:    Blakeley Scheier,HILLARY 01/04/2015, 5:11 PM   [facility], OTR/L  [phone] 01-04-2015

## 2014-12-15 NOTE — Evaluation (Signed)
Physical Therapy Evaluation Patient Details Name: JAMONICA SCHOFF MRN: [id number] DOB: 12-May-1925 Today's Date: 12/15/2014   History of Present Illness  81 y.o, female with h/o a fib, pacemaker, DM,CHF, CVA admitted with SOB.   Clinical Impression  Pt admitted with above diagnosis. Pt currently with functional limitations due to the deficits listed below (see PT Problem List). Instructed pt in use of RW. She walked 180' with RW without LOB.  For home safety she would benefit from a RW, BSC, and shower seat as well as home safety eval from HHPT.  Pt will benefit from skilled PT to increase their independence and safety with mobility to allow discharge to the venue listed below.       Follow Up Recommendations Home health PT (home safety eval)    Equipment Recommendations  Rolling walker with 5" wheels;3in1 (PT);Other (comment) (shower seat)    Recommendations for Other Services       Precautions / Restrictions Precautions Precautions: Fall Precaution Comments: pt denies falls in past 1 year Restrictions Weight Bearing Restrictions: No      Mobility  Bed Mobility Overal bed mobility: Independent                Transfers Overall transfer level: Independent                  Ambulation/Gait Ambulation/Gait assistance: Supervision Ambulation Distance (Feet): 180 Feet Assistive device: Rolling walker (2 wheeled) Gait Pattern/deviations: Step-through pattern;Trunk flexed   Gait velocity interpretation: at or above normal speed for age/gender General Gait Details: verbal cues for positioning in RW and posture, no LOB, pt reports feeling more steady with RW than with her SPC.   Stairs            Wheelchair Mobility    Modified Rankin (Stroke Patients Only)       Balance Overall balance assessment: Modified Independent                                           Pertinent Vitals/Pain Pain Assessment: No/denies pain    Home Living  Family/patient expects to be discharged to:: Private residence Living Arrangements: Alone (daughter stays with her "most nights") Available Help at Discharge: Family;Available PRN/intermittently Type of Home: House Home Access: Stairs to enter Entrance Stairs-Rails: Right Entrance Stairs-Number of Steps: 2 Home Layout: One level Home Equipment: Cane - single point;Grab bars - tub/shower      Prior Function Level of Independence: Independent with assistive device(s)         Comments: uses cane when going out, independent with bathing/dressing, doesn't drive, daughters assist with transportation, pt stated getting on/off her standard height commode is sometimes challenging     Hand Dominance        Extremity/Trunk Assessment   Upper Extremity Assessment: Overall WFL for tasks assessed           Lower Extremity Assessment: Overall WFL for tasks assessed      Cervical / Trunk Assessment: Normal  Communication   Communication: No difficulties  Cognition Arousal/Alertness: Awake/alert Behavior During Therapy: WFL for tasks assessed/performed Overall Cognitive Status: Within Functional Limits for tasks assessed                      General Comments      Exercises        Assessment/Plan    PT  Assessment Patient needs continued PT services  PT Diagnosis Generalized weakness   PT Problem List Decreased knowledge of use of DME  PT Treatment Interventions DME instruction;Gait training;Stair training   PT Goals (Current goals can be found in the Care Plan section) Acute Rehab PT Goals Patient Stated Goal: read the paper and watch tv PT Goal Formulation: With patient Time For Goal Achievement: 12/29/14 Potential to Achieve Goals: Good    Frequency Min 3X/week   Barriers to discharge        Co-evaluation               End of Session Equipment Utilized During Treatment: Gait belt Activity Tolerance: Patient tolerated treatment well;No  increased pain Patient left: in chair;with call bell/phone within reach Nurse Communication: Mobility status         Time: 1031-5945 PT Time Calculation (min) (ACUTE ONLY): 18 min   Charges:   PT Evaluation $Initial PT Evaluation Tier I: 1 Procedure     PT G Codes:        Philomena Doheny 12/15/2014, 12:45 PM [phone]

## 2014-12-15 NOTE — Progress Notes (Signed)
Utilization review completed. Elky Funches, RN, BSN. 

## 2014-12-15 NOTE — Progress Notes (Signed)
   Subjective:  No acute events overnight- Pt feeling better.  SOB much improved  Objective: Vitals reviewed  Gen: A&O, eating breakfast comfortably, CV: rrr, normal s1 s2, prominent external jugular veins, no jvd appreciated Resp: ctab Abd: soft nt nd  Labs, imaging, if obtained, then  are reviewed, and the pertinent ones are discussed either in Subjective or in the Assessment and Plan  A/P 81 yo with multiple medical problems including dCHF and incontinence with 1 week history of SOB and orthopnea likely due to lasix noncompliance due to ongoing incontinence issues.   Acute on chronic diastolic CHF with ef 32-02% in 2014: -good response with diuresis- put out about 1L over 24 hors -difficult to estimate dry weight  - IV lasix 40 mg BID -on lisinopril 20 mg daily -strict I&Os   history of CVA- on aspirin - briefly talked to Dr. Daryll Drown, will check back about the anticoagulation- per chart review, it was stopped by the cardiologist due to 'fall risk'  Falls- Consulted PT, about walker  Diet- HH DVT px- lovenox [place] Dispo- likely 1-2 days   ** LOS: 1 day   Burgess Estelle, MD 12/15/2014, 4:07 PM

## 2014-12-16 LAB — BASIC METABOLIC PANEL
ANION GAP: 10 (ref 5–15)
BUN: 20 mg/dL (ref 6–20)
CALCIUM: 8.8 mg/dL — AB (ref 8.9–10.3)
CHLORIDE: 96 mmol/L — AB (ref 101–111)
CO2: 28 mmol/L (ref 22–32)
CREATININE: 1.18 mg/dL — AB (ref 0.44–1.00)
GFR calc non Af Amer: 40 mL/min — ABNORMAL LOW (ref >60)
GFR, EST AFRICAN AMERICAN: 47 mL/min — AB (ref >60)
Glucose, Bld: 110 mg/dL — ABNORMAL HIGH (ref 65–99)
Potassium: 3.7 mmol/L (ref 3.5–5.1)
SODIUM: 134 mmol/L — AB (ref 135–145)

## 2014-12-16 NOTE — Consult Note (Signed)
[facility] CM Inpatient Consult   12/16/2014  [name] 05/15/1925 [id number] Referral received for HF education and community follow up post hospital.  Met with the patient at bedside. Explained her benefits through her [employer] Medicare for community care/disease management.   Patient states she lives alone but has 8 children.  She states, "they keep a check on me and stay with me when they want to.  My daughter [name] Russell is the daughter that signs my papers and help me make decisions.  Her number is [phone] and you can call her to see about your program.  She the one who signed me up for [employer]."  Phone called placed to the phone number provided and left a generic message requesting a return call.  Spoke with inpatient care manager, Brenda Chandler regarding patient and call to daughter with generic message.  Home health and medical equipment has been recommended, also.  Will await a return call from daughter.  For questions, please contact:  , RN BSN CCM [facility] Liaison  [phone] business mobile phone   

## 2014-12-16 NOTE — Progress Notes (Signed)
Pt being discharged home via wheelchair with family. Pt alert and oriented x4. VSS. Pt c/o no pain at this time. No signs of respiratory distress. Education complete and care plans resolved. IV removed with catheter intact and pt tolerated well. No further issues at this time. Pt to follow up with PCP. Rupert Azzara R, RN 

## 2014-12-16 NOTE — Progress Notes (Signed)
Internal Medicine Attending:   I saw and examined the patient. I reviewed the resident's note and I agree with the resident's findings and plan as documented in the resident's note.  Patient is much improved today, says her breathing is at its baseline. Vitals are stable, lungs are clear throughout. Worked with PT yesterday who recommended home equipment and ongoing physical therapy. She diuresed well with furosemide. Appears near her dry weight. Plan will be to discharge her with Lasix 40 mg in the morning and 20 mg in the evening. This is increased from the dose she was taking at home which was only 20 mg in the morning. We counseled her to continue taking this diuretic even though she has urinary incontinence. We talked about timed voiding every 2 hours throughout the day to help with her incontinence. Would advise that her afternoon dose of Lasix is taken around 2 PM. We talked to the patient briefly about anticoagulation for her atrial fibrillation, she is high risk for a CVA and would likely benefit from anticoagulation even though she is having occasional falls and has one prior GI bleed. We'll talk to her family about the risks and benefits of anticoagulation and coordinate with her primary care physician.

## 2014-12-16 NOTE — Discharge Summary (Signed)
Name: [name] MRN: [id number] DOB: July 29, 1925 81 y.o. PCP: Sid Falcon, MD  Date of Admission: 12/14/2014 11:31 AM Date of Discharge: 12/16/2014 Attending Physician: Axel Filler, MD  Discharge Diagnosis: 1. Exacerbation of HFpEF Principal Problem:   Acute on chronic diastolic congestive heart failure Active Problems:   Hypertension   Atrial fibrillation, controlled   History of sick sinus syndrome   Pacemaker-Medtronic   CKD (chronic kidney disease) stage 3, GFR 30-59 ml/min  Discharge Medications:   Medication List    TAKE these medications        aspirin EC 81 MG tablet  Take 81 mg by mouth daily.     diclofenac sodium 1 % Gel  Commonly known as:  VOLTAREN  Apply 2 g topically 4 (four) times daily as needed (for pain).     furosemide 20 MG tablet  Commonly known as:  LASIX  Take 40 mg (2tablets) in the morning and take 20 mg (1 tablet) in the evening     lisinopril 20 MG tablet  Commonly known as:  PRINIVIL,ZESTRIL  TAKE 1 TABLET BY MOUTH DAILY     metoprolol 100 MG tablet  Commonly known as:  LOPRESSOR  Take 1 tablet (100 mg total) by mouth 2 (two) times daily.     potassium chloride 10 MEQ CR capsule  Commonly known as:  MICRO-K  Take 10 mEq by mouth daily.     solifenacin 10 MG tablet  Commonly known as:  VESICARE  Take 1 tablet (10 mg total) by mouth daily.        Disposition and follow-up:   HollyHolly Duran was discharged from [facility] in Good condition.  At the hospital follow up visit please address:  1.   Heart failure with preserved ejection fraction: is the patient taking her lasix regimen as directed? 40 mg AM and 20 mg PM, what is her weight  Atrial fibrillation: CHADVASC2 score 7. Pt with high dose beta blocker and aspirin. But she may need full anticoagulation to reduce her risk of embolic stroke, even with her fall risk which has now been reduced. Pl discuss with the patient, and also with her  daughters who are actively involve in the patient's care  Urinary incontinence : timed voiding? Is that been helping her, also is she taking her lasix earlier in the PM?  Frequent falls: may need bedside commode (pl order), how is she doing with the walker and home PT.    2.  Labs / imaging needed at time of follow-up: BMP,  Need CLEAR WEIGHT PLEASE and document in epic- thank you. So far, it has been very difficult to get her dry weight. Please weigh her without jackets and purses and other accessories.   3.  Pending labs/ test needing follow-up:   Follow-up Appointments:     Follow-up Information    Follow up with Zada Finders, MD On 12/19/2014.   Specialty:  Internal Medicine   Why:  2:15 PM   Contact information:   [address] [place] [address] [phone]       Discharge Instructions: Discharge Instructions    Diet - low sodium heart healthy    Complete by:  As directed      Discharge instructions    Complete by:  As directed   Please take 2 pills of lasix in AM, and one pill at 6 PM  Please use the bathroom to void every 2 hours (timed  voiding)     Increase activity slowly    Complete by:  As directed            Consultations:    Procedures Performed:  Dg Chest 2 View  12/14/2014   CLINICAL DATA:  Shortness of breath, weakness for 1 week.  EXAM: CHEST  2 VIEW  COMPARISON:  01/21/2014  FINDINGS: There is cardiomegaly. Left pacer remains in place, unchanged. There is hyperinflation of the lungs compatible with COPD. No confluent opacities or effusions. No acute bony abnormality.  IMPRESSION: Cardiomegaly, COPD.  No active disease.   Electronically Signed   By: Rolm Baptise M.D.   On: 12/14/2014 12:35    2D Echo:   Cardiac Cath:   Admission HPI:  [name] is a very pleasant 81 yo woman with PMH of chronic diastolic failure with EF 33-29% in 2014, Afib/sick sinus with PCM in place since 2000, HTN, CVA with resulting right sided facial droop,  normocytic anemia, stress and urge incontinence, and osteoporosis, who is here for a 1 week history of SOB. She is able to walk short distances but has progressively been getting more SOB and feels more fatigued. She also reports increased edema of the leg. She has stable 2 pillow orthopnea and some PND. She eats low salt diet, and does fluid restriction- drinks about 2 8-12 oz bottlers of water per day.  Per cardiology and Dr Doristine Section note, her home dose of lasix is 40 mg AM and 20 mg PM, but she has only been taking one 20 mg pill every day last week, as she says that it makes her urinate more frequently. Her cardiologist is Dr. Lovena Le. Daughters present at bedsidde who reports Her dry weight to be about 124 lbs.   She does not report fevers, or other constitutional symptoms, and no n/v, abd pain, or change in bowel habits or cough.    She also frequently has falls- last fall was last year. I am a little concerned as she lives by herself and she only uses a cane. I believe she will be much benefitted by a 4 wheeled walker, and we will ask PT to evaluate their recommendations.   Regarding her anticoag, she is only on aspirin and not any other anticoag, she was previously on coumadin , but according to the daughter it was stopped because her cardiologist thought she was at a high fall risk, and INR was difficult to be therapeutic. She was following Dr Elie Confer and he had recommended a newer agents and they had opted not to at that time.    In the ER, vitals stable, satting 98% on ra, and afebrile. She was given a dose of iv lasix and admit for further diuresis.   Hospital Course by problem list: Principal Problem:   Acute on chronic diastolic congestive heart failure Active Problems:   Hypertension   Atrial fibrillation, controlled   History of sick sinus syndrome   Pacemaker-Medtronic   CKD (chronic kidney disease) stage 3, GFR 30-59 ml/min   1.  Acute on chronic Diastolic HFpEF due to  medication noncompliance due to incontinence issues: Patient's home dose of lasix was 40 mg in AM, and 20 mg in PM. She was only taking one 20 mg pill in the morning which likely led to the exacerbation of her HFpEF. It was difficult to get her weights in the hospital, but family mentioned her dry weight was about 124 lbs. She responded very well with 40 mg of IV  lasix BID, and had let put about 1 L out. She continued her lisinopril. She was transitioned to PO lasix, and asked to follow up in our clinic with an appointment set up for her.She was asked to adhere to he lasix regimen and was told to take her PM dose of lasix early to avoid having to get up to [place] the bathroom more frequently at night. She was explained techniques like timed voiding.   Falls: Patient ha frequentl falls and high fall risk, last fall 1 year back. She lives by herself. PT was consulted, and they provided her with a rolling walker, and recommended bedside commode.  Atrial fibrillation: Patient's CHADVASC2 score was 7 which puts her in a high risk for embolic stroke. Currently she is on aspirin, and high dose betablocker and has a single lead right ventricular pacer since 2000. She was urged to start anticoagulation, with daughters present at the bedside in the ER> The discussion will be done in an outpatient setting where the patient is more comfortable.   Discharge Vitals:   BP 108/75 mmHg  Pulse 72  Temp(Src) 97.1 F (36.2 C) (Oral)  Resp 18  Ht 5' (1.524 m)  Wt 114 lb 3.2 oz (51.801 kg)  BMI 22.30 kg/m2  SpO2 98%  Discharge Labs:  Results for orders placed or performed during the hospital encounter of 12/14/14 (from the past 24 hour(s))  Basic metabolic panel     Status: Abnormal   Collection Time: 12/16/14  5:05 AM  Result Value Ref Range   Sodium 134 (L) 135 - 145 mmol/L   Potassium 3.7 3.5 - 5.1 mmol/L   Chloride 96 (L) 101 - 111 mmol/L   CO2 28 22 - 32 mmol/L   Glucose, Bld 110 (H) 65 - 99 mg/dL   BUN 20 6 -  20 mg/dL   Creatinine, Ser 1.18 (H) 0.44 - 1.00 mg/dL   Calcium 8.8 (L) 8.9 - 10.3 mg/dL   GFR calc non Af Amer 40 (L) >60 mL/min   GFR calc Af Amer 47 (L) >60 mL/min   Anion gap 10 5 - 15    Signed: Burgess Estelle, MD 12/16/2014, 1:38 PM    Services Ordered on Discharge: home pT Equipment Ordered on Discharge: walker

## 2014-12-16 NOTE — Care Management Note (Signed)
Case Management Note  Patient Details  Name: [name] MRN: [id number] Date of Birth: 1925/12/10  Subjective/Objective:    Admitted with CHF                Action/Plan: Patient lives at home alone. She has 8 children that rotate staying with her. The grandchildren also help her. She use a cane at home. Patient could benefit from a Disease Management Program for CHF but patient politely refused stated " I don't want that." Patient has private insurance with [facility] Medicare with prescription drug coverage and does not have any problem getting her medication. Pharmacy of choice is [facility]. CM will continue to follow for DCP.  Expected Discharge Date:     possibly  12/17/2014           Expected Discharge Plan:  Home/Self Care Discharge planning Services  CM Consult  Choice offered to:  Patient  HH Arranged:  Patient Refused  Status of Service:  In process, will continue to follow  Sherrilyn Rist [phone] 12/16/2014, 10:18 AM

## 2014-12-16 NOTE — Discharge Instructions (Signed)
Please take 2 pills of lasix in AM, and one pill at 6 PM  Please use the bathroom to void every 2 hours (timed voiding)  Please use the walker   Please discuss about anticoagulation at the clinic visit- it will be beneficial for you to reduce the risk of stroke.

## 2014-12-16 NOTE — Progress Notes (Signed)
Heart Failure Navigator Consult Note  Presentation: [name]  is an 81 year old woman with heart failure with preserved ejection fraction and atrial fibrillation with sick sinus syndrome who came to the emergency department yesterday with 1 week of progressive shortness of breath. She is brought in by her daughter, the patient largely lives independently. Reported increased dyspnea with minimal exertion and increasing edema on both of her legs along with 2 pillow orthopnea which is new for her. She was prescribed diuretic with Lasix 40 mg in the morning and 20 mg in the evening, however reports intolerance to this medication because of urinary incontinence. So she only takes usually 20 mg daily. No chest pain, cough, or fevers. Otherwise has been doing well at home.   Past Medical History  Diagnosis Date  . Weight loss, unintentional 2010-2011    8/08-8/10 138-146 lbs, between 8/10-10/11 lost 18 lbs., work-up negative  . Incontinence overflow, stress female     on vesicare  . History of sick sinus syndrome s/p pacemaker Dr. Lovena Duran 2000  . Atrial fibrillation     on coumadin followed by Dr. Elie Duran  . History of vaginal bleeding 05/2005    nagative endometrial biopsy  . Restrictive lung disease 1996    PFT's showed mild disease  . Anemia     Iron deficiency, does not tolerate po  . CHF (congestive heart failure)     diastolic dysfunction  . Hypertension     well controlled on 3 agents  . Osteoporosis   . Allergic rhinitis   . Onychomycosis   . History of trichomonal urethritis   . Constipation   . Diverticulosis of colon (without mention of hemorrhage)   . Pacemaker   . Type II diabetes mellitus     diet controlled  . Stroke 2002    R MCA, cardioembolic  . Arthritis     "all over my body"  . DJD (degenerative joint disease)     Social History   Social History  . Marital Status: Widowed    Spouse Name: N/A  . Number of Children: 8  . Years of Education: N/A    Occupational History  . retired    Social History Main Topics  . Smoking status: Never Smoker   . Smokeless tobacco: Never Used  . Alcohol Use: 0.0 oz/week    0 Standard drinks or equivalent per week     Comment: occasionally.  . Drug Use: No  . Sexual Activity: No   Other Topics Concern  . None   Social History Narrative   PPM-[employer]   [name] home# [phone]   Cell# [phone]      Patient lives alone, but has 7, of 8, living children who check in on her.   She is widowed.    ECHO:Study Conclusions--05/14/12  - Left ventricle: Systolic function was normal. The estimated ejection fraction was in the range of 50% to 55%. - Aortic valve: Mild regurgitation. - Mitral valve: Mild regurgitation. - Left atrium: The atrium was moderately dilated. - Right atrium: The atrium was mildly dilated. - Tricuspid valve: Moderate regurgitation. - Pulmonary arteries: Systolic pressure was moderately increased. PA peak pressure: 33mm Hg (S).  ------------------------------------------------------------ Labs, prior tests, procedures, and surgery: Permanent pacemaker system implantation.  Transthoracic echocardiography. M-mode, complete 2D, spectral Doppler, and color Doppler. Height: Height: 162.6cm. Height: 64in. Weight: Weight: 58.1kg. Weight: 127.7lb. Body mass index: BMI: 22kg/m^2. Body surface area:  BSA: 1.34m^2. Blood pressure:   113/51. Patient status: Inpatient. Location: ICU/CCU  BNP    Component Value Date/Time   BNP 2476.8* 12/14/2014 1231    ProBNP    Component Value Date/Time   PROBNP 10317.0* 01/21/2014 0942     Education Assessment and Provision:  Detailed education and instructions provided on heart failure disease management including the following:  Signs and symptoms of Heart Failure When to call the physician Importance of daily weights Low sodium diet Fluid restriction Medication management Anticipated future  follow-up appointments  Patient education given on each of the above topics.  Patient acknowledges understanding and acceptance of all instructions.  I spoke briefly with [name] regarding her HF.  She lives alone and has a daughter who comes to help her occasionally.  She does not currently weigh daily and says that she does not have scale--tells me she will get one.  I reinforced a low sodium diet and we discussed high sodium foods to avoid.  She tells me that she really likes "hot sauce" and frequently adds it to food.  I have discouraged this practice.  She says that her daughter fills her pill box --however sometimes she forgets to take medications.  I believe that she would benefit from [facility] for ongoing symptom recognition and compliance/education reinforcement.  I will collaborate with Care Management to coordinate Home Health at discharge.  Education Materials:  "Living Better With Heart Failure" Booklet, Daily Weight Tracker Tool    High Risk Criteria for Readmission and/or Poor Patient Outcomes:   EF <30%- No 50-55%  2 or more admissions in 6 months- No  Difficult social situation- ? Lives alone--seems forgetful, however daughter nearby and visits freq.  Demonstrates medication noncompliance- No -denies   Barriers of Care:  Knowledge and compliance   Discharge Planning:   Plans to return to home alone with daughter nearby

## 2014-12-16 NOTE — Progress Notes (Signed)
Rolling walker ordered and to be delivered to the room today prior to discharging home. Stephanie with [facility] called and is aware of rolling walker. Mindi Slicker [facility] [phone]

## 2014-12-16 NOTE — Consult Note (Signed)
   [facility] CM Inpatient Consult   12/16/2014  [name] Aug 27, 1925 [id number] Call received from patient's daughter, Adrian Blackwater from [phone], HIPPA verified with 3 identifiers.  [name] states that they [family] are actively involved with the patient with her mobility and patient's well-being.  Explained [facility] Care Management and the post hospital follow up.  She states she would like the follow up.  Folder with [facility] Management information and consent left at the bedside for signing.  Will continue to follow. [name], RN BSN [facility] Liaison  [phone] business mobile phone

## 2014-12-16 NOTE — Progress Notes (Signed)
   Subjective:  No acute events overnight- Pt feeling better and eager to go home SOB much improved  Objective: Vitals reviewed  Gen: A&O, eating breakfast comfortably, CV: rrr, normal s1 s2, prominent external jugular veins, no jvd appreciated Resp: ctab, no wheezing heard  Labs, imaging, if obtained, then are reviewed, and the pertinent ones are discussed either in Subjective or in the Assessment and Plan  A/P 81 yo with multiple medical problems including dCHF and incontinence with 1 week history of SOB and orthopnea likely due to lasix noncompliance due to ongoing incontinence issues.   HFpEF with difficult diuretic compliance: -good response with diuresis- put out about 2L over 24 hors, unclear dry weight -transition to PO home lasix dose -informed patient about methods like timed voiding every 2 hours to decrease the incontinence and to improve compliance with lasix   history of CVA- on aspirin, but high risk for recurrent stroke due to existing afib and high chadvasc2 score -consulted with Dr. Daryll Drown, will address anticoagulation at the follow up appointment when daughters are present with the patient.   Falls- She received a walker to take home with her Home PT   Disposition- Home today.    LOS: 2 days   Burgess Estelle, MD 12/16/2014, 1:37 PM

## 2014-12-18 NOTE — Consult Note (Signed)
   [facility] CM Inpatient Consult   12/18/2014  ANNA-MARIE COLLER 1925-12-02 [id number] Consent was received from inpatient RN stating, "the patient's family dropped these papers by for you and asked to given the paper to you."  Consent form for [facility] Care Management services were signed.  Will have community nurse to follow up for post discharge transition of care needs and evaluate for home visits. [name], RN BSN [facility] Liaison  [phone] business mobile phone

## 2014-12-19 ENCOUNTER — Encounter: Payer: Self-pay | Admitting: Internal Medicine

## 2014-12-19 ENCOUNTER — Ambulatory Visit (INDEPENDENT_AMBULATORY_CARE_PROVIDER_SITE_OTHER): Payer: Commercial Managed Care - HMO | Admitting: Internal Medicine

## 2014-12-19 VITALS — BP 122/58 | HR 77 | Temp 97.8°F | Ht 60.0 in | Wt 117.4 lb

## 2014-12-19 DIAGNOSIS — I5033 Acute on chronic diastolic (congestive) heart failure: Secondary | ICD-10-CM

## 2014-12-19 DIAGNOSIS — I4891 Unspecified atrial fibrillation: Secondary | ICD-10-CM

## 2014-12-19 DIAGNOSIS — Z7982 Long term (current) use of aspirin: Secondary | ICD-10-CM

## 2014-12-19 DIAGNOSIS — E46 Unspecified protein-calorie malnutrition: Secondary | ICD-10-CM

## 2014-12-19 MED ORDER — ENSURE ACTIVE HEART HEALTH PO LIQD: 237.0000 mL | Freq: Three times a day (TID) | ORAL | Status: AC

## 2014-12-19 NOTE — Assessment & Plan Note (Addendum)
Patient following up after hospital visit for acute HFpEF exacerbation. She denies any SOB, DOE, orthopnea, PND, chest pain, or cough. She does report some mild lower extremity edema that is much improved since her hospital admission. Her CHF exacerbation is likely due to Lasix non-compliance due to frequent urination/incontinence. Patient was advised to continue her prescribed Lasix 40 mg in the AM and 20 mg in the PM, taking the PM dose earlier to avoid frequent urination during the night. Patient states she is compliant with this, taking her PM dose at 4 pm now. Current weight at 117 lbs. Reported dry weight of 124 per family. -Continue Lasix 40 mg AM and 20 mg at 4 PM -Follow up in 2 months

## 2014-12-19 NOTE — Assessment & Plan Note (Signed)
Patient and daughter had discussion during her hospital stay concerning reinitiating [facility] therapy. CHADSVASc2 score of 7 puts her at 11% yearly risk for stroke. Patient's daughter states that she has discussed with physicians during hospital stay about risks for stroke and risks/benefits of AC and would not like for her mother to add another blood thinner. She was previously on warfarin which was difficult to keep in therapeutic range, and due to falls and risk for further falls, has been told she is not a good candidate for further AC. She is currently on Aspirin 81 mg daily. Rate controlled at 77, regular rhythm. -Continue Aspirin 81 mg

## 2014-12-19 NOTE — Assessment & Plan Note (Addendum)
Patient's daughter reports that [name] has a decreased appetite/intake of food. Weight at 117 lbs today, with reported dry weight of 124 per family. Patient not considered for megace due to possible clot risk and denies Dronabinol due to marijuana component. -Will prescribe Ensure heart healthy shake to drink once TID w/ meals. Can also try OTC, whichever is more affordable. -Patient advised to try food that appeals to her with salt substitutes -CMET for Albumin and electrolytes

## 2014-12-19 NOTE — Progress Notes (Signed)
Patient ID: [name], female   DOB: Feb 04, 1926, 81 y.o.   MRN: [id number]   Subjective:   Patient ID: [name] female   DOB: 10/23/1925 81 y.o.   MRN: [id number]  HPI: [name] is a 81 y.o. pleasant lady with PMH as listed below who presents for HFU after acute on chronic diastolic HFpEF exacerbation. She is accompanied by her daughter who provides additional history.  Please see problem list for details.    Past Medical History  Diagnosis Date  . Weight loss, unintentional 2010-2011    8/08-8/10 138-146 lbs, between 8/10-10/11 lost 18 lbs., work-up negative  . Incontinence overflow, stress female     on vesicare  . History of sick sinus syndrome s/p pacemaker Dr. Lovena Le 2000  . Atrial fibrillation     on coumadin followed by Dr. Elie Confer  . History of vaginal bleeding 05/2005    nagative endometrial biopsy  . Restrictive lung disease 1996    PFT's showed mild disease  . Anemia     Iron deficiency, does not tolerate po  . CHF (congestive heart failure)     diastolic dysfunction  . Hypertension     well controlled on 3 agents  . Osteoporosis   . Allergic rhinitis   . Onychomycosis   . History of trichomonal urethritis   . Constipation   . Diverticulosis of colon (without mention of hemorrhage)   . Pacemaker   . Type II diabetes mellitus     diet controlled  . Stroke 2002    R MCA, cardioembolic  . Arthritis     "all over my body"  . DJD (degenerative joint disease)    Current Outpatient Prescriptions  Medication Sig Dispense Refill  . aspirin EC 81 MG tablet Take 81 mg by mouth daily.    . diclofenac sodium (VOLTAREN) 1 % GEL Apply 2 g topically 4 (four) times daily as needed (for pain).     . furosemide (LASIX) 20 MG tablet Take 40 mg (2tablets) in the morning and take 20 mg (1 tablet) in the evening (Patient taking differently: Take 20-40 mg by mouth 2 (two) times daily. Take 40 mg (2 tablets) by mouth in the morning and take 20 mg (1 tablet) by  mouth in the evening) 30 tablet 6  . lisinopril (PRINIVIL,ZESTRIL) 20 MG tablet TAKE 1 TABLET BY MOUTH DAILY 30 tablet 5  . metoprolol (LOPRESSOR) 100 MG tablet Take 1 tablet (100 mg total) by mouth 2 (two) times daily. 60 tablet 3  . Nutritional Supplements ([place]) LIQD Take 237 mLs by mouth 3 (three) times daily. 90 Bottle 0  . potassium chloride (MICRO-K) 10 MEQ CR capsule Take 10 mEq by mouth daily.   2  . solifenacin (VESICARE) 10 MG tablet Take 1 tablet (10 mg total) by mouth daily. 90 tablet 4   No current facility-administered medications for this visit.   Family History  Problem Relation Age of Onset  . Diabetes Sister   . Heart disease     Social History   Social History  . Marital Status: Widowed    Spouse Name: N/A  . Number of Children: 8  . Years of Education: N/A   Occupational History  . retired    Social History Main Topics  . Smoking status: Never Smoker   . Smokeless tobacco: Never Used  . Alcohol Use: 0.0 oz/week    0 Standard drinks or equivalent per week  Comment: occasionally.  . Drug Use: No  . Sexual Activity: No   Other Topics Concern  . None   Social History Narrative   PPM-[employer]   [name] home# [phone]   Cell# [phone]      Patient lives alone, but has 7, of 8, living children who check in on her.   She is widowed.   Review of Systems: Review of Systems  Constitutional: Negative for fever and chills.  Respiratory: Negative for shortness of breath and wheezing.   Cardiovascular: Negative for chest pain, palpitations and leg swelling.  Gastrointestinal: Negative for nausea, vomiting and abdominal pain.  Genitourinary: Positive for frequency.  Musculoskeletal: Negative for falls.  Neurological: Negative for weakness and headaches.    Objective:  Physical Exam: Filed Vitals:   12/19/14 1523  BP: 122/58  Pulse: 77  Temp: 97.8 F (36.6 C)  TempSrc: Oral  Height: 5' (1.524 m)  Weight: 117 lb 6.4 oz  (53.252 kg)  SpO2: 100%   Physical Exam  Constitutional:  Thin appearance, elderly woman.  HENT:  Head: Normocephalic and atraumatic.  Cardiovascular: Normal rate and regular rhythm.   No murmur heard. Pulmonary/Chest: Effort normal and breath sounds normal. No respiratory distress. She has no wheezes. She has no rales.  Abdominal: Soft. There is no tenderness.  Musculoskeletal: She exhibits no tenderness.  Trace pedal edema. Ambulates with cane.  Skin: Skin is warm.    Assessment & Plan:  Please see problem based charting for assessment and plan.

## 2014-12-19 NOTE — Patient Instructions (Addendum)
Thank you for your visit [name].  For your appetite I will send a prescription for nutritional shakes to your pharmacy.  Please continue to take your Lasix 40 mg in the morning and 20 mg in the evening at 4 PM.  Please continue to take your Aspirin 81 mg daily as you are.  Please also follow up with our clinic in 2 months.

## 2014-12-20 LAB — COMPREHENSIVE METABOLIC PANEL
ALT: 8 IU/L (ref 0–32)
AST: 22 IU/L (ref 0–40)
Albumin/Globulin Ratio: 1.1 (ref 1.1–2.5)
Albumin: 4.1 g/dL (ref 3.5–4.7)
Alkaline Phosphatase: 161 IU/L — ABNORMAL HIGH (ref 39–117)
BILIRUBIN TOTAL: 1.1 mg/dL (ref 0.0–1.2)
BUN/Creatinine Ratio: 20 (ref 11–26)
BUN: 24 mg/dL (ref 8–27)
CALCIUM: 9.1 mg/dL (ref 8.7–10.3)
CHLORIDE: 96 mmol/L — AB (ref 97–108)
CO2: 25 mmol/L (ref 18–29)
CREATININE: 1.19 mg/dL — AB (ref 0.57–1.00)
GFR, EST AFRICAN AMERICAN: 47 mL/min/{1.73_m2} — AB (ref >59)
GFR, EST NON AFRICAN AMERICAN: 41 mL/min/{1.73_m2} — AB (ref >59)
GLUCOSE: 183 mg/dL — AB (ref 65–99)
Globulin, Total: 3.8 g/dL (ref 1.5–4.5)
Potassium: 4 mmol/L (ref 3.5–5.2)
Sodium: 140 mmol/L (ref 134–144)
TOTAL PROTEIN: 7.9 g/dL (ref 6.0–8.5)

## 2014-12-24 ENCOUNTER — Telehealth (HOSPITAL_COMMUNITY): Payer: Self-pay | Admitting: Surgery

## 2014-12-24 NOTE — Telephone Encounter (Signed)
Heart Failure Nurse Navigator Post Discharge Follow-up Telephone Call  I called [name] regarding her recent hospitalization.  She tells me that she has been doing "well".  She has not been weighing and says that she does not have a scale.  She lives alone with several daughters who come in to help her.  I have asked her to ask her daughters if they can get her a scale and explained again the importance of daily weights.  She tells me that she went for a follow-up appt this week on Tuesday --yet could not say with who?  She denies any shortness of breath.  She says that she has all medications and is taking them as prescribed.  I have encouraged her to call me with any questions or concerns regarding her HF if needed.

## 2014-12-24 NOTE — Patient Outreach (Signed)
[place] [facility]) Care Management  12/24/2014  [name] [birth year] [id number]   Referral from [name], RN via in-basket to assign [employer], assigned [name], Therapist, sports.  Thanks, [name]. [place], [place] Assistant Phone: [phone] Fax: [phone]

## 2014-12-25 ENCOUNTER — Other Ambulatory Visit: Payer: Self-pay | Admitting: *Deleted

## 2014-12-25 NOTE — Patient Outreach (Signed)
Referral received from hospital liaison, V. Doug Sou, for transition of care and community care Freight forwarder assessment for home visits.  Member recently discharged (8/30) after being hospitalized for heart failure.  Call placed to number listed as the member's number [phone]) but the number was changed/disconnected.  Call placed to daughter, Adrian Blackwater at [phone], no answer.  HIPPA compliant voice message left.  Will await call back.  Will continue with transition of care calls next week.  [name], BSN, [place] Management  [facility] Care Manager [phone]

## 2014-12-25 NOTE — Progress Notes (Signed)
Internal Medicine Clinic Attending  I saw and evaluated the patient.  I personally confirmed the key portions of the history and exam documented by Dr. Patel,Vishal and I reviewed pertinent patient test results.  The assessment, diagnosis, and plan were formulated together and I agree with the documentation in the resident's note.  

## 2014-12-26 ENCOUNTER — Other Ambulatory Visit: Payer: Self-pay | Admitting: *Deleted

## 2014-12-26 NOTE — Patient Outreach (Signed)
Call placed to member's daughter, Adrian Blackwater.  No answer, HIPPA compliant voice message left.  Will await call back.  If no call back, will make 3rd attempt to contact daughter next week.    [name], BSN, [place] Management  [facility] Care Manager [phone]

## 2014-12-31 ENCOUNTER — Other Ambulatory Visit: Payer: Self-pay | Admitting: *Deleted

## 2014-12-31 ENCOUNTER — Encounter: Payer: Self-pay | Admitting: *Deleted

## 2014-12-31 NOTE — Patient Outreach (Signed)
3rd attempt to contact daughter, [name], regarding member's discharge from hospital and to engage in transition of care program.  No answer at number provided in chart or on consent [phone]).  HIPPA compliant voice message left.  Will await call back.  Will send outreach letter to home, if no response in 10 business days will close case.  [name], BSN, [place] Management  [facility] Care Manager [phone]

## 2015-01-14 ENCOUNTER — Encounter: Payer: Self-pay | Admitting: *Deleted

## 2015-01-21 NOTE — Patient Outreach (Signed)
[place] [facility]) Care Management  01/21/2015  [name] 03-18-1926 [id number]   Notification from Tomasa Rand, RN to close case due to unable to contact patient for [place] Management services.  Thanks, [name]. [place], [place] Assistant Phone: [phone] Fax: [phone]

## 2015-01-27 ENCOUNTER — Encounter: Payer: Self-pay | Admitting: Internal Medicine

## 2015-02-04 ENCOUNTER — Other Ambulatory Visit: Payer: Self-pay | Admitting: *Deleted

## 2015-02-05 MED ORDER — POTASSIUM CHLORIDE ER 10 MEQ PO CPCR: 10.0000 meq | ORAL_CAPSULE | Freq: Every day | ORAL | Status: DC

## 2015-03-04 ENCOUNTER — Encounter: Payer: Self-pay | Admitting: Internal Medicine

## 2015-03-04 ENCOUNTER — Other Ambulatory Visit: Payer: Self-pay | Admitting: Internal Medicine

## 2015-03-04 ENCOUNTER — Ambulatory Visit (INDEPENDENT_AMBULATORY_CARE_PROVIDER_SITE_OTHER): Payer: Commercial Managed Care - HMO | Admitting: Internal Medicine

## 2015-03-04 VITALS — BP 124/66 | HR 76 | Temp 97.9°F | Wt 124.0 lb

## 2015-03-04 DIAGNOSIS — R739 Hyperglycemia, unspecified: Secondary | ICD-10-CM

## 2015-03-04 DIAGNOSIS — E440 Moderate protein-calorie malnutrition: Secondary | ICD-10-CM | POA: Diagnosis not present

## 2015-03-04 DIAGNOSIS — K6289 Other specified diseases of anus and rectum: Secondary | ICD-10-CM

## 2015-03-04 DIAGNOSIS — R4181 Age-related cognitive decline: Secondary | ICD-10-CM | POA: Diagnosis not present

## 2015-03-04 DIAGNOSIS — I110 Hypertensive heart disease with heart failure: Secondary | ICD-10-CM

## 2015-03-04 DIAGNOSIS — I10 Essential (primary) hypertension: Secondary | ICD-10-CM

## 2015-03-04 DIAGNOSIS — I5032 Chronic diastolic (congestive) heart failure: Secondary | ICD-10-CM | POA: Diagnosis not present

## 2015-03-04 DIAGNOSIS — J302 Other seasonal allergic rhinitis: Secondary | ICD-10-CM

## 2015-03-04 DIAGNOSIS — Z0000 Encounter for general adult medical examination without abnormal findings: Secondary | ICD-10-CM

## 2015-03-04 DIAGNOSIS — R159 Full incontinence of feces: Secondary | ICD-10-CM

## 2015-03-04 DIAGNOSIS — D696 Thrombocytopenia, unspecified: Secondary | ICD-10-CM

## 2015-03-04 LAB — GLUCOSE, CAPILLARY: Glucose-Capillary: 84 mg/dL (ref 65–99)

## 2015-03-04 LAB — POCT GLYCOSYLATED HEMOGLOBIN (HGB A1C): Hemoglobin A1C: 5.9

## 2015-03-04 MED ORDER — CETIRIZINE HCL 1 MG/ML PO SYRP: 5.0000 mg | ORAL_SOLUTION | Freq: Every day | ORAL | Status: DC

## 2015-03-04 NOTE — Progress Notes (Signed)
Subjective:    Patient ID: [name], female    DOB: 1926/04/07, 81 y.o.   MRN: [id number]  CC: Routine follow up for heart failure, 3 months  HPI  Ms. Lippe is an 81yo woman with PMH of chronic diastolic HF, Afib with SSS and PC in place following with cardiology, CKD, HTN, malnutrition, low platelets and osteoporosis who presents for routine follow up.    Ms. Bigler is hard of hearing and presents with her daughter who supplies some of the history.  Ms. Escutia reports that she is doing well.  She is most concerned today with swelling in her lower legs.  Today she says they are "allright" but she has good days and bad.  She is taking her lasix 2 tablets in the morning and 1 in the evening.  She is elevating her feet in the evening.  She is still doing some housework and lives on her own.  She is wearing compression stockings today.  She reports no wounds on her feet.  She has chronic diastolic heart failure and has previously had issues with taking her medications causing her swelling, but I think this daily variation is likely due to food intake.  I advised her about ways to take her lasix to help with this issue.  She reports no chest pain, orthopnea, PND.   She continues to have occasional fecal incontinence for which I had previously sent her to GI.  She had a hospital admission and other issues come up and therefore did not make this appointment.  She is due to see them in December.  She has no back pain, urinary incontinence or saddle anesthesia (previously checked by me and normal.)  She does note some increased allergies with nasal congestion, runny nose and sneezing.  She also notes occasional itching.  She would like a liquid allergy medication for the allergies and wants to try benadryl OTC cream.  Her daughter is worried that the itching may be due to her diabetes (she is diet controlled and not currently on medications) and would like her A1C checked today.    She does have  some knee pain which is intermittent. She would like to try an OTC knee brace.  She is not interested in PT.  She has been taking her ensure for malnutrition and has gained some weight.  She does have forgetful episodes per her daughter, but does not have wandering or dangerous behavior by report.   We reviewed her medications.    Review of Systems  Constitutional: Negative for fever, activity change and fatigue.  HENT: Positive for congestion, rhinorrhea and sneezing. Negative for dental problem, ear discharge, ear pain, postnasal drip and sinus pressure.   Eyes: Negative for photophobia, discharge and visual disturbance.  Respiratory: Negative for cough, shortness of breath and wheezing.   Cardiovascular: Positive for leg swelling. Negative for chest pain.  Gastrointestinal: Positive for diarrhea (with some incontinence). Negative for constipation and rectal pain.  Genitourinary: Negative for dysuria and frequency.  Musculoskeletal: Positive for arthralgias (knee). Negative for back pain and gait problem.  Neurological: Negative for dizziness, weakness and light-headedness.       Objective:   Physical Exam  Constitutional: She is oriented to person, place, and time.  Thin elderly woman, NAD  HENT:  Head: Normocephalic and atraumatic.  Eyes: Conjunctivae are normal. No scleral icterus.  Cardiovascular: Normal rate, regular rhythm and normal heart sounds.  Exam reveals no friction rub.   Pulmonary/Chest: Effort normal  and breath sounds normal. No respiratory distress. She has no rales.  Abdominal: Soft. Bowel sounds are normal. There is no tenderness.  Musculoskeletal: She exhibits edema (1+ on the left, trace on the right to mid calf). She exhibits no tenderness.  Neurological: She is alert and oriented to person, place, and time. She exhibits normal muscle tone.  Skin: Skin is warm and dry.  Psychiatric: She has a normal mood and affect. Her behavior is normal.   A1C and blood  glucose today.      Assessment & Plan:  RTC in 3-4 months, prn sooner if needed  Hyperglycemia - Check A1C, blood sugar given patient and family's concern - A1C 5.9, will reassure them.

## 2015-03-04 NOTE — Patient Instructions (Signed)
General Instructions: Please schedule a follow up visit within the next 3-4 months.   For your medications:   Please bring all of your pill  Bottles with you to each visit.  This will help make sure that we have an up to date list of all the medications you are taking.  Please also bring any over the counter herbal medications you are taking (not including advil, tylenol, etc.)  Please continue taking all of your medications as previously prescribed.   Please start taking Cetirizine (Zyrtec) 5mg  daily (1mg /mL) syrup for your allergies.   For your knee pain, you can purchase a brace at the pharmacy as needed.  If you would like to be fitted for a brace, I can send you to the physical therapist.     Thank you!  Please call with any questions.    Treatment Goals:  Goals (1 Years of Data) as of 03/04/15          As of Today 12/19/14 12/16/14 12/16/14 12/15/14     Blood Pressure   . Blood Pressure < 140/90  124/66 122/58 106/47 108/75 103/44     Diet   . Have 3 meals a day           Result Component   . HEMOGLOBIN A1C < 7.0         . LDL CALC < 100            Progress Toward Treatment Goals:  Treatment Goal 03/04/2015  Hemoglobin A1C -  Blood pressure at goal  Prevent falls -    Self Care Goals & Plans:  Self Care Goal 11/05/2014  Manage my medications take my medicines as prescribed; bring my medications to every visit; refill my medications on time  Monitor my health keep track of my blood pressure  Eat healthy foods eat more vegetables; eat foods that are low in salt; eat baked foods instead of fried foods  Be physically active find an activity I enjoy  Prevent falls -  Meeting treatment goals -    Home Blood Glucose Monitoring 10/29/2012  Check my blood sugar no home glucose monitoring  When to check my blood sugar -     Care Management & Community Referrals:  Referral 06/11/2014  Referrals made for care management support none needed  Referrals made to community  resources -      Cetirizine oral syrup What is this medicine? CETIRIZINE (se TI ra zeen) is an antihistamine. This medicine is used to treat or prevent symptoms of allergies. It is also used to help reduce itchy skin rash and hives. This medicine may be used for other purposes; ask your health care provider or pharmacist if you have questions. What should I tell my health care provider before I take this medicine? They need to know if you have any of these conditions: -kidney disease -liver disease -an unusual or allergic reaction to cetirizine, hydroxyzine, other medicines, foods, dyes, or preservatives -pregnant or trying to get pregnant -breast-feeding How should I use this medicine? Take this medicine by mouth. Follow the directions on the prescription label. Use a specially marked spoon or container to measure your medicine. Household spoons are not accurate. Ask your pharmacist if you do not have one. You can take this medicine with food or on an empty stomach. Take your medicine at regular intervals. Do not take more often than directed. You may need to take this medicine for several days before your symptoms improve. Talk  to your pediatrician regarding the use of this medicine in children. Special care may be needed. This medicine has been used in children as young as 6 months. Overdosage: If you think you have taken too much of this medicine contact a poison control center or emergency room at once. NOTE: This medicine is only for you. Do not share this medicine with others. What if I miss a dose? If you miss a dose, take it as soon as you can. If it is almost time for your next dose, take only that dose. Do not take double or extra doses. What may interact with this medicine? -alcohol -certain medicines for anxiety or sleep -narcotic medicines for pain -other medicines for colds or allergies This list may not describe all possible interactions. Give your health care provider a  list of all the medicines, herbs, non-prescription drugs, or dietary supplements you use. Also tell them if you smoke, drink alcohol, or use illegal drugs. Some items may interact with your medicine. What should I watch for while using this medicine? Visit your doctor or health care professional for regular checks on your health. Tell your doctor if your symptoms do not improve. This medicine may make you feel confused, dizzy or lightheaded. Drinking alcohol or taking medicine that causes drowsiness can make this worse. Do not drive, use machinery, or do anything that needs mental alertness until you know how this medicine affects you. Your mouth may get dry. Chewing sugarless gum or sucking hard candy, and drinking plenty of water will help. What side effects may I notice from receiving this medicine? Side effects that you should report to your doctor or health care professional as soon as possible: -allergic reactions like skin rash, itching or hives, swelling of the face, lips, or tongue -changes in vision or hearing -fast or irregular heartbeat -trouble passing urine or change in the amount of urine Side effects that usually do not require medical attention (report to your doctor or health care professional if they continue or are bothersome): -dizziness -dry mouth -irritability -sore throat -stomach pain -tiredness This list may not describe all possible side effects. Call your doctor for medical advice about side effects. You may report side effects to FDA at 1-800-FDA-1088. Where should I keep my medicine? Keep out of the reach of children. Store at room temperature of 59 to 86 degrees F (15 to 30 degrees C). You may store in the refrigerator at 36 to 46 degrees F (2 to 8 degrees C). Throw away any unused medicine after the expiration date. NOTE: This sheet is a summary. It may not cover all possible information. If you have questions about this medicine, talk to your doctor, pharmacist,  or health care provider.    2016, Elsevier/Gold Standard. (2013-12-24 22:09:54)

## 2015-03-05 ENCOUNTER — Other Ambulatory Visit: Payer: Self-pay | Admitting: Internal Medicine

## 2015-03-05 DIAGNOSIS — I5032 Chronic diastolic (congestive) heart failure: Secondary | ICD-10-CM

## 2015-03-05 NOTE — Assessment & Plan Note (Signed)
She declined flu shot.    Otherwise, due to age, no cancer screening is being attempted.

## 2015-03-05 NOTE — Assessment & Plan Note (Signed)
Family reports some forgetfulness.  This does not seem to be progressing at this time.  She does not exhibit any dangerous behavior.  She lives on her own.  Continue to monitor.

## 2015-03-05 NOTE — Assessment & Plan Note (Signed)
She was previously admitted this year and was previously not taking her lasix as prescribed.  She is now taking as prescribed and following with Cardiology.  Given she has some bad days with her swelling, I advised her to take 2 tablets of lasix (40mg ) for her afternoon dose and see if this helped.  Otherwise, continue 40mg  in the AM and 20mg  in the PM.

## 2015-03-05 NOTE — Assessment & Plan Note (Signed)
Last CMET showed Tbili of 1.1.  Will resolve.

## 2015-03-05 NOTE — Assessment & Plan Note (Signed)
She does not express any concerning change in symptoms.  She does not have back pain or saddle anesthesia.  Symptoms sound to be improving by her report.    Plan:  Keep GI appointment on 12/9

## 2015-03-05 NOTE — Assessment & Plan Note (Signed)
Chronic and stable as of last check.  Check CBC at next visit.  No signs/symptoms of bleeding.

## 2015-03-05 NOTE — Assessment & Plan Note (Signed)
BP Readings from Last 3 Encounters:  03/04/15 124/66  12/19/14 122/58  12/16/14 106/47    Lab Results  Component Value Date   NA 140 12/19/2014   K 4.0 12/19/2014   CREATININE 1.19* 12/19/2014    Assessment: Blood pressure control: controlled Progress toward BP goal:  at goal   Plan: Medications:  continue current medications Educational resources provided:   Self management tools provided:   Other plans: Monitor closely for any weakness or lightheadedness, given age and other comorbidities, she is at high risk for falls.

## 2015-03-05 NOTE — Assessment & Plan Note (Signed)
Worsening today with change of season.  Requesting therapy.   Plan Cetirizine liquid 5mg /22mL Daily.  This is the highest dose recommended for her age.

## 2015-03-05 NOTE — Assessment & Plan Note (Signed)
She is taking ensure as prescribed and has gained some weight.  Continue current therapy.

## 2015-03-14 ENCOUNTER — Encounter (HOSPITAL_COMMUNITY): Payer: Self-pay | Admitting: Neurology

## 2015-03-14 ENCOUNTER — Observation Stay (HOSPITAL_COMMUNITY)
Admission: EM | Admit: 2015-03-14 | Discharge: 2015-03-15 | Disposition: A | Discharge status: Home / Self Care | Payer: Commercial Managed Care - HMO | Attending: Internal Medicine | Admitting: Internal Medicine

## 2015-03-14 ENCOUNTER — Emergency Department (HOSPITAL_COMMUNITY): Payer: Commercial Managed Care - HMO

## 2015-03-14 DIAGNOSIS — I130 Hypertensive heart and chronic kidney disease with heart failure and stage 1 through stage 4 chronic kidney disease, or unspecified chronic kidney disease: Secondary | ICD-10-CM | POA: Diagnosis not present

## 2015-03-14 DIAGNOSIS — I495 Sick sinus syndrome: Secondary | ICD-10-CM | POA: Diagnosis not present

## 2015-03-14 DIAGNOSIS — N183 Chronic kidney disease, stage 3 unspecified: Secondary | ICD-10-CM | POA: Diagnosis present

## 2015-03-14 DIAGNOSIS — N39490 Overflow incontinence: Secondary | ICD-10-CM | POA: Insufficient documentation

## 2015-03-14 DIAGNOSIS — J984 Other disorders of lung: Secondary | ICD-10-CM | POA: Diagnosis not present

## 2015-03-14 DIAGNOSIS — Z7982 Long term (current) use of aspirin: Secondary | ICD-10-CM | POA: Diagnosis not present

## 2015-03-14 DIAGNOSIS — E1122 Type 2 diabetes mellitus with diabetic chronic kidney disease: Principal | ICD-10-CM | POA: Insufficient documentation

## 2015-03-14 DIAGNOSIS — I5033 Acute on chronic diastolic (congestive) heart failure: Secondary | ICD-10-CM | POA: Diagnosis not present

## 2015-03-14 DIAGNOSIS — Z8679 Personal history of other diseases of the circulatory system: Secondary | ICD-10-CM

## 2015-03-14 DIAGNOSIS — I4891 Unspecified atrial fibrillation: Secondary | ICD-10-CM | POA: Diagnosis not present

## 2015-03-14 DIAGNOSIS — R0602 Shortness of breath: Secondary | ICD-10-CM | POA: Diagnosis not present

## 2015-03-14 DIAGNOSIS — Z950 Presence of cardiac pacemaker: Secondary | ICD-10-CM | POA: Diagnosis present

## 2015-03-14 DIAGNOSIS — E440 Moderate protein-calorie malnutrition: Secondary | ICD-10-CM | POA: Diagnosis present

## 2015-03-14 DIAGNOSIS — M1990 Unspecified osteoarthritis, unspecified site: Secondary | ICD-10-CM | POA: Diagnosis not present

## 2015-03-14 DIAGNOSIS — I5032 Chronic diastolic (congestive) heart failure: Secondary | ICD-10-CM

## 2015-03-14 DIAGNOSIS — I5031 Acute diastolic (congestive) heart failure: Secondary | ICD-10-CM | POA: Diagnosis not present

## 2015-03-14 DIAGNOSIS — Z7901 Long term (current) use of anticoagulants: Secondary | ICD-10-CM | POA: Diagnosis not present

## 2015-03-14 DIAGNOSIS — Z79899 Other long term (current) drug therapy: Secondary | ICD-10-CM | POA: Diagnosis not present

## 2015-03-14 DIAGNOSIS — N393 Stress incontinence (female) (male): Secondary | ICD-10-CM | POA: Insufficient documentation

## 2015-03-14 DIAGNOSIS — Z6822 Body mass index (BMI) 22.0-22.9, adult: Secondary | ICD-10-CM | POA: Insufficient documentation

## 2015-03-14 DIAGNOSIS — I10 Essential (primary) hypertension: Secondary | ICD-10-CM | POA: Diagnosis present

## 2015-03-14 DIAGNOSIS — R0600 Dyspnea, unspecified: Secondary | ICD-10-CM

## 2015-03-14 DIAGNOSIS — M810 Age-related osteoporosis without current pathological fracture: Secondary | ICD-10-CM | POA: Diagnosis present

## 2015-03-14 DIAGNOSIS — Z8673 Personal history of transient ischemic attack (TIA), and cerebral infarction without residual deficits: Secondary | ICD-10-CM | POA: Diagnosis not present

## 2015-03-14 LAB — BASIC METABOLIC PANEL
Anion gap: 8 (ref 5–15)
BUN: 17 mg/dL (ref 6–20)
CALCIUM: 9.5 mg/dL (ref 8.9–10.3)
CO2: 25 mmol/L (ref 22–32)
CREATININE: 1.23 mg/dL — AB (ref 0.44–1.00)
Chloride: 106 mmol/L (ref 101–111)
GFR calc Af Amer: 44 mL/min — ABNORMAL LOW (ref >60)
GFR, EST NON AFRICAN AMERICAN: 38 mL/min — AB (ref >60)
Glucose, Bld: 93 mg/dL (ref 65–99)
POTASSIUM: 3.9 mmol/L (ref 3.5–5.1)
SODIUM: 139 mmol/L (ref 135–145)

## 2015-03-14 LAB — CBC
HCT: 35.8 % — ABNORMAL LOW (ref 36.0–46.0)
Hemoglobin: 11.6 g/dL — ABNORMAL LOW (ref 12.0–15.0)
MCH: 30.5 pg (ref 26.0–34.0)
MCHC: 32.4 g/dL (ref 30.0–36.0)
MCV: 94.2 fL (ref 78.0–100.0)
Platelets: 142 K/uL — ABNORMAL LOW (ref 150–400)
RBC: 3.8 MIL/uL — ABNORMAL LOW (ref 3.87–5.11)
RDW: 15.7 % — ABNORMAL HIGH (ref 11.5–15.5)
WBC: 3.8 K/uL — ABNORMAL LOW (ref 4.0–10.5)

## 2015-03-14 LAB — I-STAT TROPONIN, ED: TROPONIN I, POC: 0.02 ng/mL (ref 0.00–0.08)

## 2015-03-14 LAB — TROPONIN I
Troponin I: <0.03 ng/mL
Troponin I: <0.03 ng/mL

## 2015-03-14 LAB — BRAIN NATRIURETIC PEPTIDE: B NATRIURETIC PEPTIDE 5: 1895 pg/mL — AB (ref 0.0–100.0)

## 2015-03-14 LAB — GLUCOSE, CAPILLARY
GLUCOSE-CAPILLARY: 168 mg/dL — AB (ref 65–99)
Glucose-Capillary: 89 mg/dL (ref 65–99)

## 2015-03-14 MED ORDER — FUROSEMIDE 80 MG PO TABS
80.0000 mg | ORAL_TABLET | Freq: Two times a day (BID) | ORAL | Status: AC
  Administered 2015-03-14 – 2015-03-15 (×2): 80 mg via ORAL
  Filled 2015-03-14 (×2): qty 1

## 2015-03-14 MED ORDER — FUROSEMIDE 10 MG/ML IJ SOLN
60.0000 mg | Freq: Once | INTRAMUSCULAR | Status: AC
  Administered 2015-03-14: 60 mg via INTRAVENOUS
  Filled 2015-03-14: qty 6

## 2015-03-14 MED ORDER — INSULIN ASPART 100 UNIT/ML ~~LOC~~ SOLN: 0.0000 [IU] | Freq: Every day | SUBCUTANEOUS | Status: DC

## 2015-03-14 MED ORDER — METOPROLOL TARTRATE 100 MG PO TABS
100.0000 mg | ORAL_TABLET | Freq: Two times a day (BID) | ORAL | Status: DC
  Administered 2015-03-14 – 2015-03-15 (×2): 100 mg via ORAL
  Filled 2015-03-14 (×2): qty 1

## 2015-03-14 MED ORDER — SODIUM CHLORIDE 0.9 % IJ SOLN
3.0000 mL | Freq: Two times a day (BID) | INTRAMUSCULAR | Status: DC
  Administered 2015-03-14 – 2015-03-15 (×3): 3 mL via INTRAVENOUS

## 2015-03-14 MED ORDER — LISINOPRIL 20 MG PO TABS
20.0000 mg | ORAL_TABLET | Freq: Every day | ORAL | Status: DC
  Administered 2015-03-14 – 2015-03-15 (×2): 20 mg via ORAL
  Filled 2015-03-14 (×2): qty 1

## 2015-03-14 MED ORDER — ENOXAPARIN SODIUM 40 MG/0.4ML ~~LOC~~ SOLN
40.0000 mg | SUBCUTANEOUS | Status: DC
  Administered 2015-03-14: 40 mg via SUBCUTANEOUS
  Filled 2015-03-14: qty 0.4

## 2015-03-14 MED ORDER — INSULIN ASPART 100 UNIT/ML ~~LOC~~ SOLN
0.0000 [IU] | Freq: Three times a day (TID) | SUBCUTANEOUS | Status: DC
  Administered 2015-03-14 – 2015-03-15 (×2): 2 [IU] via SUBCUTANEOUS

## 2015-03-14 MED ORDER — ASPIRIN EC 81 MG PO TBEC
81.0000 mg | DELAYED_RELEASE_TABLET | Freq: Every day | ORAL | Status: DC
  Administered 2015-03-14 – 2015-03-15 (×2): 81 mg via ORAL
  Filled 2015-03-14 (×2): qty 1

## 2015-03-14 MED ORDER — POTASSIUM CHLORIDE 20 MEQ/15ML (10%) PO SOLN
20.0000 meq | Freq: Two times a day (BID) | ORAL | Status: DC
  Administered 2015-03-14 – 2015-03-15 (×2): 20 meq via ORAL
  Filled 2015-03-14 (×2): qty 15

## 2015-03-14 MED ORDER — DARIFENACIN HYDROBROMIDE ER 15 MG PO TB24
15.0000 mg | ORAL_TABLET | Freq: Every day | ORAL | Status: DC
  Administered 2015-03-15: 15 mg via ORAL
  Filled 2015-03-14: qty 1

## 2015-03-14 MED ORDER — ENSURE ENLIVE PO LIQD
237.0000 mL | Freq: Three times a day (TID) | ORAL | Status: DC
  Administered 2015-03-14 – 2015-03-15 (×3): 237 mL via ORAL

## 2015-03-14 NOTE — H&P (Signed)
  Date: 03/14/2015  Patient name: [name]  Medical record number: [id number]  Date of birth: 1925/12/24   I have seen and evaluated [name] and discussed their care with the Residency Team. [name] is a pleasant 81 yo female admitted for volume overload. Her most recent ECHO was in 2014 and showed a nl EF, dilated atriums B, Mild AR and MR, and elevated PA pressure. For her diastolic HF, she is on lasix 40 in the AM and 20 in the PM but had increased that to 40 BID recently without improvement in her dyspnea and LE edema. She cannot afford TED dose and they are too difficult for her to get on anyway. She has a scale at home but only checks her weight occasionally. Her weight does seem to have increased a bit - on 9/2 she was 117, on 11/16, she was 124, and on admit 122. Her dyspnea has also gotten a bit worse. She has noticed a cough, mostly after she eats and her daughter has also noticed a dry cough at night. She also c/o a funny feeling in her chest triggered by nothing specific and uses alka seltzer. She has also been falling asleep quickly whenever she sits down for the past month. C/O increased gas and a RLQ pain for a "long time." Still with fecal incontinence about once weekly and has GI appt in Dec.   She got 60 lasix IV in ED and is already feeling better - legs and breathing. She is sch to start 60 PO BID tonight.   Soc Hx : she sometimes walks with a cane. She cooked the entire thanksgiving meal.  Filed Vitals:   03/14/15 1414 03/14/15 1430  BP:  138/75  Pulse:  93  Temp: 98.1 F (36.7 C) 98.1 F (36.7 C)  Resp:  20   Gen : lying in bed. NAD. Speaking in full sentences.  HRRR occ irregular beat Lungs crackles at bases B ABD + BS, soft, no sig tenderness in RLQ Ext + 1 edema to knee but not more proximal  Cr 1.23 stable Trop I - x2 BNP 1895 Alk phos 161 CXR no overt pul edema EKG no ischemia. Inferiolateral TWI old.  Assessment and Plan: I have seen  and evaluated the patient as outlined above. I agree with the formulated Assessment and Plan as detailed in the residents' admission note, with the following changes:   1. Acute on chronic diastolic heart failure - Her sxs inc weight increase, elevated DNP, increased LE edema, and pul sxs. Her CXR does not confirm pul edema though. She has already responded to one dose of IV lasix and she will be started on PO lasix. I&O's and weights along with her Cr will be followed. Repeat ECHO has been performed but results pending to see status of Pul HTN and R heart. Trigger for exacerbation unlikely to be ischemia, could be diet induced.   2. RLQ pain, gas, and fecal incontinence - these are long standing problems. There is nothing concerning on her ABD exam. She has an outpt GI appt  3. A Fib - Dr [name] is considering risks and benefits of anti-coag. Discussions in past were against warfarin bc of family's wishes, fall risks, and trouble with warfarin in past. Will cont to discuss and talk about DOACs.   Bartholomew Crews, MD 11/26/20163:45 PM

## 2015-03-14 NOTE — ED Provider Notes (Signed)
CSN: [id number]     Arrival date & time 03/14/15  0910 History   First MD Initiated Contact with Patient 03/14/15 [phone]     Chief Complaint  Patient presents with  . Shortness of Breath     (Consider location/radiation/quality/duration/timing/severity/associated sxs/prior Treatment) HPI Patient presents with a daughter who assists with the history of present illness. In the status of the past week patient has had increasing bilateral lower extremity edema, symmetrically. Concurrently, she has developed fatigue, dyspnea, particularly with exertion.  There has been no chest pain, or other focal pain throughout.  Patient has been compliant with medication, and has had recent doubling of her Lasix dose.  In addition, the patient has a history of atrial fibrillation, and again states that she is compliant with all medication.  No recent fever, chills, nausea, vomiting, diarrhea   Past Medical History  Diagnosis Date  . Weight loss, unintentional 2010-2011    8/08-8/10 138-146 lbs, between 8/10-10/11 lost 18 lbs., work-up negative  . Incontinence overflow, stress female     on vesicare  . History of sick sinus syndrome s/p pacemaker Dr. Lovena Le 2000  . Atrial fibrillation (HCC)     on coumadin followed by Dr. Elie Confer  . History of vaginal bleeding 05/2005    nagative endometrial biopsy  . Restrictive lung disease 1996    PFT's showed mild disease  . Anemia     Iron deficiency, does not tolerate po  . CHF (congestive heart failure) (HCC)     diastolic dysfunction  . Hypertension     well controlled on 3 agents  . Osteoporosis   . Allergic rhinitis   . Onychomycosis   . History of trichomonal urethritis   . Constipation   . Diverticulosis of colon (without mention of hemorrhage)   . Pacemaker   . Type II diabetes mellitus (HCC)     diet controlled  . Stroke [facility]) 2002    R MCA, cardioembolic  . Arthritis     "all over my body"  . DJD (degenerative joint disease)    Past  Surgical History  Procedure Laterality Date  . Cholecystectomy    . Cataract extraction    . Abdominal hysterectomy    . Pacemaker generator change  10/2012    AdaptaL 10/2012 by Dr Lovena Le  . Insert / replace / remove pacemaker  2000    MDT Sigma pacemaker implanted by Dr Lovena Le;  . Pacemaker generator change N/A 11/02/2012    Procedure: PACEMAKER GENERATOR CHANGE;  Surgeon: Evans Lance, MD;  Location: [facility] CATH LAB;  Service: Cardiovascular;  Laterality: N/A;   Family History  Problem Relation Age of Onset  . Diabetes Sister   . Heart disease     Social History  Substance Use Topics  . Smoking status: Never Smoker   . Smokeless tobacco: Never Used  . Alcohol Use: 0.0 oz/week    0 Standard drinks or equivalent per week     Comment: occasionally.   OB History    No data available     Review of Systems  Constitutional:       Per HPI, otherwise negative  HENT:       Per HPI, otherwise negative  Respiratory:       Per HPI, otherwise negative  Cardiovascular:       Per HPI, otherwise negative  Gastrointestinal: Negative for vomiting.  Endocrine:       Negative aside from HPI  Genitourinary:  Neg aside from HPI   Musculoskeletal:       Per HPI, otherwise negative  Skin: Negative.   Neurological: Negative for syncope.      Allergies  Review of patient's allergies indicates no known allergies.  Home Medications   Prior to Admission medications   Medication Sig Start Date End Date Taking? Authorizing Provider  aspirin EC 81 MG tablet Take 81 mg by mouth daily.    Historical Provider, MD  cetirizine (ZYRTEC) 1 MG/ML syrup Take 5 mLs (5 mg total) by mouth daily. 03/04/15   Sid Falcon, MD  diclofenac sodium (VOLTAREN) 1 % GEL Apply 2 g topically 4 (four) times daily as needed (for pain).     Historical Provider, MD  furosemide (LASIX) 20 MG tablet TAKE 2 TABLETS BY MOUTH EVERY MORNING AND 1 TABLET EVERY EVENING 03/05/15   Oval Linsey, MD  lisinopril  (PRINIVIL,ZESTRIL) 20 MG tablet TAKE 1 TABLET BY MOUTH DAILY 07/01/14   Sid Falcon, MD  metoprolol (LOPRESSOR) 100 MG tablet Take 1 tablet (100 mg total) by mouth 2 (two) times daily. 10/27/14   Sid Falcon, MD  Nutritional Supplements ([place]) LIQD Take 237 mLs by mouth 3 (three) times daily. 12/19/14   Zada Finders, MD  potassium chloride 20 MEQ/15ML (10%) SOLN TK 15 MLS PO D 12/31/14   Historical Provider, MD  VESICARE 10 MG tablet TAKE 1 TABLET BY MOUTH EVERY DAY 03/05/15   Sid Falcon, MD   BP 118/58 mmHg  Pulse 65  Temp(Src) 98.3 F (36.8 C) (Oral)  Resp 20  SpO2 97% Physical Exam  Constitutional: She is oriented to person, place, and time. She has a sickly appearance. No distress.  HENT:  Head: Normocephalic and atraumatic.  Eyes: Conjunctivae and EOM are normal.  Cardiovascular: Normal rate and regular rhythm.   Murmur heard.  Systolic murmur is present  Pulmonary/Chest: Effort normal and breath sounds normal. No stridor. No respiratory distress.  Abdominal: She exhibits no distension.  Musculoskeletal: She exhibits no edema.  Symmetric bilateral lower extremity edema, +1 pitting  Neurological: She is alert and oriented to person, place, and time. No cranial nerve deficit.  Skin: Skin is warm and dry.  Psychiatric: She has a normal mood and affect.  Nursing note and vitals reviewed.   ED Course  Procedures (including critical care time) Labs Review Labs Reviewed  CBC - Abnormal; Notable for the following:    WBC 3.8 (*)    RBC 3.80 (*)    Hemoglobin 11.6 (*)    HCT 35.8 (*)    RDW 15.7 (*)    Platelets 142 (*)    All other components within normal limits  BASIC METABOLIC PANEL  BRAIN NATRIURETIC PEPTIDE  I-STAT TROPOININ, ED    Imaging Review Dg Chest 2 View  03/14/2015  CLINICAL DATA:  81 year old female with bilateral lower extremity swelling and shortness of breath EXAM: CHEST  2 VIEW COMPARISON:  Prior chest x-ray 12/14/2014  FINDINGS: Left subclavian approach cardiac rhythm maintenance device. The lead projects over the right ventricle. Stable cardiomegaly. Atherosclerotic calcifications again noted in the transverse aorta. No evidence of acute pulmonary edema, pleural effusion, focal airspace consolidation or pneumothorax. Pulmonary hyperinflation with bronchitic change consistent with underlying COPD. No acute osseous abnormality. IMPRESSION: 1. Stable chest x-ray without evidence of acute cardiopulmonary process. 2. Stable cardiomegaly with no evidence of active pulmonary edema. 3. Hyperinflation and bronchitic changes suggest underlying COPD. Electronically Signed   By: Myrle Sheng  Laurence Ferrari M.D.   On: 03/14/2015 10:07   I have personally reviewed and evaluated these images and lab results as part of my medical decision-making.   EKG Interpretation   Date/Time:  Saturday March 14 2015 09:30:22 EST Ventricular Rate:  78 PR Interval:    QRS Duration: 88 QT Interval:  386 QTC Calculation: 440 R Axis:   -52 Text Interpretation:  Atrial fibrillation with occasional  ventricular-paced complexes Left anterior fascicular block Possible  Anterior infarct , age undetermined ST \\T \ T wave abnormality, consider  inferolateral ischemia Abnormal ECG Atrial fibrillation occaisonal paced  rhythm T wave abnormality Abnormal ekg Confirmed by Carmin Muskrat  MD  ([id number]) on 03/14/2015 10:52:09 AM     Cardiac 90/100, A. fib, abnormal  12:00 PM Patient in no distress. I discussed all findings with the patient and her daughter. Given evidence for worsening heart failure, the patient will receive IV Lasix, be admitted for further evaluation, management, monitoring, diuresis.   MDM  Elderly female with multiple medical issues, including congestive heart failure presents with dyspnea, swelling. Here, the patient is awake and alert, though with evidence for worsening congestive heart failure. Patient was admitted for further  evaluation and management.   Carmin Muskrat, MD 03/14/15 1201

## 2015-03-14 NOTE — H&P (Signed)
Date: 03/14/2015               Patient Name:  [name] MRN: [id number]  DOB: 06/04/1925 Age / Sex: 81 y.o., female   PCP: Sid Falcon, MD         Medical Service: Internal Medicine Teaching Service         Attending Physician: Dr. Bartholomew Crews, MD    First Contact: Dr. Jule Ser Pager: [phone]  Second Contact: Dr. Albin Felling Pager: [phone]       After Hours (After 5p/  First Contact Pager: [phone]  weekends / holidays): Second Contact Pager: [phone]   Chief Complaint: leg swelling and shortness of breath  History of Present Illness: [name] is a 81 y.o. female with past medical history of chronic HFpEF, atrial fibrillation w/ SSS (pacemaker in place follows with Dr. Lovena Le - Cardiology), CKD, HTN, malnutrition, thrombocytopenia, osteoporosis who presents to the emergency department with complaint of leg swelling and shortness of breath.  Patient states symptoms began Wednesday and patient reports her bilateral lower extremity swelling has progressively increased since then.  Patient reports swelling all the way to her thigh and pain due to her legs feeling tight and achy.  Patient states she is compliant with all her medications including her Lasix.  States she has to get up 3-4 times per night to urinate.  Denies any erythema or increased warmth to her legs.  Patient states that she has noticed increased dyspnea on exertion with short distances.  Patient has 2 pillow orthopnea that is stable.  Reports PND.  No dyspnea at rest, no chest pain, no lightheadeness, no dysuria.  Reports abdominal pain RLQ.  Has not had a bowel movement in 3 days.  Patient was last hospitalized in August 2016 with acute exacerbation of her CHF and was previously not taking her Lasix as prescribed.  She was recently seen by her PCP on 11/17.  Patient notes some bad days with her swelling and was advised to take 40mg  Lasix for her afternoon dose to see if this would help.   Patient states she has been taking 40mg  bid with minimal improvement since symptom onset this Wednesday.  Meds: No current facility-administered medications for this encounter.    Allergies: Allergies as of 03/14/2015  . (No Known Allergies)   Past Medical History  Diagnosis Date  . Weight loss, unintentional 2010-2011    8/08-8/10 138-146 lbs, between 8/10-10/11 lost 18 lbs., work-up negative  . Incontinence overflow, stress female     on vesicare  . History of sick sinus syndrome s/p pacemaker Dr. Lovena Le 2000  . Atrial fibrillation (HCC)     on coumadin followed by Dr. Elie Confer  . History of vaginal bleeding 05/2005    nagative endometrial biopsy  . Restrictive lung disease 1996    PFT's showed mild disease  . Anemia     Iron deficiency, does not tolerate po  . CHF (congestive heart failure) (HCC)     diastolic dysfunction  . Hypertension     well controlled on 3 agents  . Osteoporosis   . Allergic rhinitis   . Onychomycosis   . History of trichomonal urethritis   . Constipation   . Diverticulosis of colon (without mention of hemorrhage)   . Pacemaker   . Type II diabetes mellitus (HCC)     diet controlled  . Stroke [facility]) 2002    R MCA, cardioembolic  . Arthritis     "  all over my body"  . DJD (degenerative joint disease)    Past Surgical History  Procedure Laterality Date  . Cholecystectomy    . Cataract extraction    . Abdominal hysterectomy    . Pacemaker generator change  10/2012    AdaptaL 10/2012 by Dr Lovena Le  . Insert / replace / remove pacemaker  2000    MDT Sigma pacemaker implanted by Dr Lovena Le;  . Pacemaker generator change N/A 11/02/2012    Procedure: PACEMAKER GENERATOR CHANGE;  Surgeon: Evans Lance, MD;  Location: [facility] CATH LAB;  Service: Cardiovascular;  Laterality: N/A;   Family History  Problem Relation Age of Onset  . Diabetes Sister   . Heart disease     Social History   Social History  . Marital Status: Widowed    Spouse Name: N/A  .  Number of Children: 8  . Years of Education: N/A   Occupational History  . retired    Social History Main Topics  . Smoking status: Never Smoker   . Smokeless tobacco: Never Used  . Alcohol Use: 0.0 oz/week    0 Standard drinks or equivalent per week     Comment: occasionally.  . Drug Use: No  . Sexual Activity: No   Other Topics Concern  . Not on file   Social History Narrative   PPM-[employer]   [name] home# [id number]   Cell# [phone]      Patient lives alone, but has 7, of 8, living children who check in on her.   She is widowed.    Review of Systems: Pertinent items are noted in HPI.  Physical Exam: Blood pressure 138/75, pulse 93, temperature 98.1 F (36.7 C), temperature source Oral, resp. rate 20, height 5' (1.524 m), weight 122 lb 2.2 oz (55.4 kg), SpO2 100 %.  Physical Exam  Constitutional: She is oriented to person, place, and time. She appears well-developed and well-nourished. No distress.  HENT:  Head: Normocephalic and atraumatic.  Eyes: EOM are normal.  Neck: Normal range of motion. Neck supple. No JVD present.  Cardiovascular: Normal rate and intact distal pulses.   Occasional irregular rhythm  Pulmonary/Chest: Effort normal. No respiratory distress.  Fine bibasilar crackles  Abdominal: Soft. Bowel sounds are normal. There is tenderness (RLQ likely stool).  Musculoskeletal: She exhibits edema (2+ bilateral pitting edema to knee).  Neurological: She is alert and oriented to person, place, and time. No cranial nerve deficit.  Skin: Skin is warm and dry. No rash noted.  Psychiatric: She has a normal mood and affect.     Lab results: Basic Metabolic Panel:  Recent Labs  03/14/15 1024  NA 139  K 3.9  CL 106  CO2 25  GLUCOSE 93  BUN 17  CREATININE 1.23*  CALCIUM 9.5   Liver Function Tests: No results for input(s): AST, ALT, ALKPHOS, BILITOT, PROT, ALBUMIN in the last 72 hours. No results for input(s): LIPASE, AMYLASE in the last 72  hours. No results for input(s): AMMONIA in the last 72 hours. CBC:  Recent Labs  03/14/15 1024  WBC 3.8*  HGB 11.6*  HCT 35.8*  MCV 94.2  PLT 142*   Cardiac Enzymes:  Recent Labs  03/14/15 1337  TROPONINI <0.03   BNP: No results for input(s): PROBNP in the last 72 hours. D-Dimer: No results for input(s): DDIMER in the last 72 hours. CBG: No results for input(s): GLUCAP in the last 72 hours. Hemoglobin A1C: No results for input(s): HGBA1C in the last 72  hours. Fasting Lipid Panel: No results for input(s): CHOL, HDL, LDLCALC, TRIG, CHOLHDL, LDLDIRECT in the last 72 hours. Thyroid Function Tests: No results for input(s): TSH, T4TOTAL, FREET4, T3FREE, THYROIDAB in the last 72 hours. Anemia Panel: No results for input(s): VITAMINB12, FOLATE, FERRITIN, TIBC, IRON, RETICCTPCT in the last 72 hours. Coagulation: No results for input(s): LABPROT, INR in the last 72 hours. Urine Drug Screen: Drugs of Abuse  No results found for: LABOPIA, COCAINSCRNUR, LABBENZ, AMPHETMU, THCU, LABBARB  Alcohol Level: No results for input(s): ETH in the last 72 hours. Urinalysis: No results for input(s): COLORURINE, LABSPEC, PHURINE, GLUCOSEU, HGBUR, BILIRUBINUR, KETONESUR, PROTEINUR, UROBILINOGEN, NITRITE, LEUKOCYTESUR in the last 72 hours.  Invalid input(s): APPERANCEUR   Imaging results:  Dg Chest 2 View  03/14/2015  CLINICAL DATA:  81 year old female with bilateral lower extremity swelling and shortness of breath EXAM: CHEST  2 VIEW COMPARISON:  Prior chest x-ray 12/14/2014 FINDINGS: Left subclavian approach cardiac rhythm maintenance device. The lead projects over the right ventricle. Stable cardiomegaly. Atherosclerotic calcifications again noted in the transverse aorta. No evidence of acute pulmonary edema, pleural effusion, focal airspace consolidation or pneumothorax. Pulmonary hyperinflation with bronchitic change consistent with underlying COPD. No acute osseous abnormality.  IMPRESSION: 1. Stable chest x-ray without evidence of acute cardiopulmonary process. 2. Stable cardiomegaly with no evidence of active pulmonary edema. 3. Hyperinflation and bronchitic changes suggest underlying COPD. Electronically Signed   By: Jacqulynn Cadet M.D.   On: 03/14/2015 10:07    Other results: EKG: Atrial fibrillation with occasional ventricular-paced complexes.  Possibly new T-wave inversions inferior-lateral leads  Assessment & Plan by Problem: Principal Problem:   Acute diastolic (congestive) heart failure (HCC) Active Problems:   Hypertension   Osteoporosis   History of stroke   Atrial fibrillation, controlled (HCC)   History of sick sinus syndrome   Pacemaker-[employer]   CKD (chronic kidney disease) stage 3, GFR 30-59 ml/min   Malnutrition of moderate degree ([place])  81 y.o. female with past medical history of chronic HFpEF, atrial fibrillation w/ SSS (pacemaker in place follows with Dr. Lovena Le - Cardiology), CKD, HTN, malnutrition, thrombocytopenia, osteoporosis who presents to the emergency department with complaint of leg swelling and shortness of breath.   Acute on Chronic HFpEF: Last Echo was 2014 with EF 50-55%. History and presentation of PND, increased DOE, increased swelling suggests exacerbation of her heart failure.  Likely NYHA class III with SOB on minimal exertion.  Lungs with mild bibasilar fine crackles.  Chest x-ray was stable with no evidence of pulmonary edema.  BNP elevated to 1895 (2476 last exacerbation admission).  Initial troponins negative.  EKG shows atrial fibrillation with occasional ventricular paced complexes.  Concern for possible new T-wave inversions of inferior-lateral leads compared to old tracings.  Patient reports compliance with her home Lasix of 40mg  AM and 20mg  PM and, in fact, reports taking 40mg  BID since her office visit with Dr. Daryll Drown on 11/17.  Other risk factors that could precipitate an exacerbation are present such as a-fib,  possible dietary indiscretion, and too much fluid intake.  Her weight today is 122 pounds.  Weight last admission was 121 at admission, 114 at discharge.  Given Lasix 60mg  IV once in the ED - Repeat EKG - Echo to re-evaluate with 2 recent exacerbations - Troponins x 3 - BMP in the morning - Lasix 80mg  po bid - Continue Lisinopril 20mg  po daily - Continue Metoprolol 100mg  po bid - Daily weights - I/O's  Atrial Fibrillation: on high dose beta  blockade with single lead RV pacemaker in place from 2000.  She is on aspirin for stroke prophylaxis.  Her CHADSVASC is 7 indicating need for full anticoagulation.  Has previously been on warfarin but taken off due to difficult INR and risk of falls.  Even with her fall risk, she probably would benefit from [facility] because she is high risk for embolic stroke.  This discussion was had with daughter and patient at her last hospital follow up in September. And the risks/benefits of AC were discussed.  Patients daughter did not want to add another blood thinner to her regimen.  - Continue ASA 81mg  daily - Continue metoprolol  Incontinence  - Continue Darifenacin  Malnutrion - Ensure tid  Falls - ambulates with cane - Stable with no recent falls  FEN: Fluids: none Electrolytes: KCl 4mEq po daily Nutrition: heart healthy  Code: FULL  DVT PPx: Lovenox  Dispo: Disposition is deferred at this time, awaiting improvement of current medical problems. Anticipated discharge in approximately 1-2 day(s).   The patient does have a current PCP Sid Falcon, MD) and does need an [facility] hospital follow-up appointment after discharge.  The patient does have transportation limitations that hinder transportation to clinic appointments.  Signed: Jule Ser, DO 03/14/2015, 2:37 PM

## 2015-03-14 NOTE — ED Notes (Signed)
Pt has swelling to BLE since yesterday morning. Has hx of CHF, denies cp but feels sob for all this week. Has been taking lasix.

## 2015-03-14 NOTE — ED Notes (Signed)
Internal Medicine at bedside.  

## 2015-03-15 ENCOUNTER — Observation Stay (HOSPITAL_BASED_OUTPATIENT_CLINIC_OR_DEPARTMENT_OTHER): Payer: Commercial Managed Care - HMO

## 2015-03-15 DIAGNOSIS — I495 Sick sinus syndrome: Secondary | ICD-10-CM | POA: Diagnosis not present

## 2015-03-15 DIAGNOSIS — I509 Heart failure, unspecified: Secondary | ICD-10-CM

## 2015-03-15 DIAGNOSIS — E440 Moderate protein-calorie malnutrition: Secondary | ICD-10-CM | POA: Diagnosis not present

## 2015-03-15 DIAGNOSIS — I5031 Acute diastolic (congestive) heart failure: Secondary | ICD-10-CM | POA: Diagnosis not present

## 2015-03-15 DIAGNOSIS — I5033 Acute on chronic diastolic (congestive) heart failure: Secondary | ICD-10-CM | POA: Diagnosis not present

## 2015-03-15 DIAGNOSIS — I4891 Unspecified atrial fibrillation: Secondary | ICD-10-CM | POA: Diagnosis not present

## 2015-03-15 DIAGNOSIS — N183 Chronic kidney disease, stage 3 (moderate): Secondary | ICD-10-CM | POA: Diagnosis not present

## 2015-03-15 DIAGNOSIS — I130 Hypertensive heart and chronic kidney disease with heart failure and stage 1 through stage 4 chronic kidney disease, or unspecified chronic kidney disease: Secondary | ICD-10-CM | POA: Diagnosis not present

## 2015-03-15 DIAGNOSIS — E1122 Type 2 diabetes mellitus with diabetic chronic kidney disease: Secondary | ICD-10-CM | POA: Diagnosis not present

## 2015-03-15 DIAGNOSIS — M1990 Unspecified osteoarthritis, unspecified site: Secondary | ICD-10-CM | POA: Diagnosis not present

## 2015-03-15 DIAGNOSIS — J984 Other disorders of lung: Secondary | ICD-10-CM | POA: Diagnosis not present

## 2015-03-15 LAB — CBC
HCT: 33.3 % — ABNORMAL LOW (ref 36.0–46.0)
HEMOGLOBIN: 11.1 g/dL — AB (ref 12.0–15.0)
MCH: 31.5 pg (ref 26.0–34.0)
MCHC: 33.3 g/dL (ref 30.0–36.0)
MCV: 94.6 fL (ref 78.0–100.0)
PLATELETS: 137 10*3/uL — AB (ref 150–400)
RBC: 3.52 MIL/uL — AB (ref 3.87–5.11)
RDW: 15.8 % — ABNORMAL HIGH (ref 11.5–15.5)
WBC: 4.6 10*3/uL (ref 4.0–10.5)

## 2015-03-15 LAB — GLUCOSE, CAPILLARY
Glucose-Capillary: 158 mg/dL — ABNORMAL HIGH (ref 65–99)
Glucose-Capillary: 110 mg/dL — ABNORMAL HIGH (ref 65–99)

## 2015-03-15 LAB — BASIC METABOLIC PANEL
ANION GAP: 6 (ref 5–15)
BUN: 18 mg/dL (ref 6–20)
CALCIUM: 9.2 mg/dL (ref 8.9–10.3)
CO2: 30 mmol/L (ref 22–32)
CREATININE: 1.35 mg/dL — AB (ref 0.44–1.00)
Chloride: 100 mmol/L — ABNORMAL LOW (ref 101–111)
GFR calc non Af Amer: 34 mL/min — ABNORMAL LOW (ref >60)
GFR, EST AFRICAN AMERICAN: 40 mL/min — AB (ref >60)
Glucose, Bld: 202 mg/dL — ABNORMAL HIGH (ref 65–99)
Potassium: 3.7 mmol/L (ref 3.5–5.1)
SODIUM: 136 mmol/L (ref 135–145)

## 2015-03-15 LAB — TROPONIN I

## 2015-03-15 MED ORDER — FUROSEMIDE 20 MG PO TABS: 40.0000 mg | ORAL_TABLET | Freq: Two times a day (BID) | ORAL | Status: DC

## 2015-03-15 MED ORDER — ENOXAPARIN SODIUM 30 MG/0.3ML ~~LOC~~ SOLN: 30.0000 mg | SUBCUTANEOUS | Status: DC

## 2015-03-15 NOTE — Progress Notes (Signed)
Echocardiogram 2D Echocardiogram has been performed.  Joelene Millin 03/15/2015, 4:16 PM

## 2015-03-15 NOTE — Progress Notes (Signed)
  Date: 03/15/2015  Patient name: ESI ROSENBAUM  Medical record number: [id number]  Date of birth: 1926/02/01   This patient has been seen and the plan of care was discussed with the house staff. Please see their note for complete details. I concur with their findings with the following additions/corrections: Feels well today. States leg edema resolved and breathing well. C/O itching on skin. LCTAB. LE with hyperpigmentation indicative of chronic edema. Cr increased from 1.19 - 1.23 - 1.35 but baseline seems to be 1.2. Weight recorded as down 6 lbs and I/O net negative almost 5 L. Stable for D/C home on 40 lasix PO BID. Close [place] F/U.   Bartholomew Crews, MD 03/15/2015, 12:02 PM

## 2015-03-15 NOTE — Discharge Summary (Signed)
Name: [name] MRN: [id number] DOB: [birth year] 81 y.o. PCP: Sid Falcon, MD  Date of Admission: 03/14/2015  9:31 AM Date of Discharge: 03/15/2015 Attending Physician: Bartholomew Crews, MD  Discharge Diagnosis: Principal Problem:   Acute diastolic (congestive) heart failure ([place]) Active Problems:   Hypertension   Osteoporosis   History of stroke   Atrial fibrillation, controlled ([place])   History of sick sinus syndrome   Pacemaker-Medtronic   CKD (chronic kidney disease) stage 3, GFR 30-59 ml/min   Malnutrition of moderate degree (HCC)  Discharge Medications:   Medication List    TAKE these medications        aspirin EC 81 MG tablet  Take 81 mg by mouth daily.     cetirizine 1 MG/ML syrup  Commonly known as:  ZYRTEC  Take 5 mLs (5 mg total) by mouth daily.     ENSURE ACTIVE HEART HEALTH Liqd  Take 237 mLs by mouth 3 (three) times daily.     furosemide 20 MG tablet  Commonly known as:  LASIX  Take 2 tablets (40 mg total) by mouth 2 (two) times daily.     lisinopril 20 MG tablet  Commonly known as:  PRINIVIL,ZESTRIL  TAKE 1 TABLET BY MOUTH DAILY     metoprolol 100 MG tablet  Commonly known as:  LOPRESSOR  Take 1 tablet (100 mg total) by mouth 2 (two) times daily.     potassium chloride 20 MEQ/15ML (10%) Soln  TK 15 MLS PO D     VESICARE 10 MG tablet  Generic drug:  solifenacin  TAKE 1 TABLET BY MOUTH EVERY DAY        Disposition and follow-up:   [name] was discharged from [facility] in Stable condition.    1.  At the hospital follow up visit please address:  - Patients volume status.  She was discharged on increased dose of Lasix.  Previously had been taking 40mg  qAM and 20mg  qPM with an additional 20mg  as needed.  We discharged her on 40mg  bid - Patient received Echo prior to discharge but was dishcarged to home prior to results coming back.  Her EF is newly low.  Already on Ace inhibitor and beta blocker.   Results sent to PCP.  Please review results with patient  2.  Labs / imaging needed at time of follow-up: BMP  3.  Pending labs/ test needing follow-up: Echo  Follow-up Appointments:     Follow-up Information    Follow up with Gilles Chiquito, MD. Schedule an appointment as soon as possible for a visit in 1 week.   Specialty:  Internal Medicine   Why:  They will call you to schedule an appointment.     Contact information:   [place] [address] [phone]       Discharge Instructions: Discharge Instructions    Diet - low sodium heart healthy    Complete by:  As directed      Increase activity slowly    Complete by:  As directed            Consultations:    Procedures Performed:  Dg Chest 2 View  03/14/2015  CLINICAL DATA:  81 year old female with bilateral lower extremity swelling and shortness of breath EXAM: CHEST  2 VIEW COMPARISON:  Prior chest x-ray 12/14/2014 FINDINGS: Left subclavian approach cardiac rhythm maintenance device. The lead projects over the right ventricle. Stable cardiomegaly. Atherosclerotic calcifications again noted in  the transverse aorta. No evidence of acute pulmonary edema, pleural effusion, focal airspace consolidation or pneumothorax. Pulmonary hyperinflation with bronchitic change consistent with underlying COPD. No acute osseous abnormality. IMPRESSION: 1. Stable chest x-ray without evidence of acute cardiopulmonary process. 2. Stable cardiomegaly with no evidence of active pulmonary edema. 3. Hyperinflation and bronchitic changes suggest underlying COPD. Electronically Signed   By: Jacqulynn Cadet M.D.   On: 03/14/2015 10:07    2D Echo:  - Left ventricle: The cavity size was normal. Wall thickness was normal. Systolic function was moderately to severely reduced. The estimated ejection fraction was in the range of 30% to 35%. Regional wall motion abnormalities cannot be excluded. - Aortic valve: There was mild to  moderate regurgitation. - Mitral valve: Prolapse cannot be excluded. There was moderate regurgitation. - Left atrium: The atrium was moderately dilated. - Right atrium: The atrium was moderately dilated. - Tricuspid valve: There was severe regurgitation. - Pulmonary arteries: PA peak pressure: 82 mm Hg (S). - Pericardium, extracardiac: A trivial pericardial effusion was identified.  Cardiac Cath: none  Admission HPI:  [name] is a 81 y.o. female with past medical history of chronic HFpEF, atrial fibrillation w/ SSS (pacemaker in place follows with Dr. Lovena Le - Cardiology), CKD, HTN, malnutrition, thrombocytopenia, osteoporosis who presents to the emergency department with complaint of leg swelling and shortness of breath. Patient states symptoms began Wednesday and patient reports her bilateral lower extremity swelling has progressively increased since then. Patient reports swelling all the way to her thigh and pain due to her legs feeling tight and achy. Patient states she is compliant with all her medications including her Lasix. States she has to get up 3-4 times per night to urinate. Denies any erythema or increased warmth to her legs. Patient states that she has noticed increased dyspnea on exertion with short distances. Patient has 2 pillow orthopnea that is stable. Reports PND. No dyspnea at rest, no chest pain, no lightheadeness, no dysuria. Reports abdominal pain RLQ. Has not had a bowel movement in 3 days.  Patient was last hospitalized in August 2016 with acute exacerbation of her CHF and was previously not taking her Lasix as prescribed. She was recently seen by her PCP on 11/17. Patient notes some bad days with her swelling and was advised to take 40mg  Lasix for her afternoon dose to see if this would help. Patient states she has been taking 40mg  bid with minimal improvement since symptom onset this Wednesday.  Hospital Course by problem list: Principal  Problem:   Acute diastolic (congestive) heart failure (HCC) Active Problems:   Hypertension   Osteoporosis   History of stroke   Atrial fibrillation, controlled ([place])   History of sick sinus syndrome   Pacemaker-Medtronic   CKD (chronic kidney disease) stage 3, GFR 30-59 ml/min   Malnutrition of moderate degree ([place])   Acute on Chronic HFpEF: Last Echo was 2014 with EF 50-55%. History and presentation of PND, increased DOE, increased swelling suggests exacerbation of her heart failure. Likely NYHA class III with SOB on minimal exertion. Lungs with mild bibasilar fine crackles on admission. Chest x-ray was stable with no evidence of pulmonary edema. BNP elevated to 1895 (2476 last exacerbation admission). Initial troponins were negative. EKG showed atrial fibrillation with occasional ventricular paced complexes. Patient reports compliance with her home Lasix of 40mg  AM and 20mg  PM and, in fact, reported taking 40mg  BID since her office visit with Dr. Daryll Drown on 11/17. Other risk factors that could  precipitate an exacerbation were present such as a-fib, possible dietary indiscretion, and too much fluid intake. Her weight at admission was 122 pounds. Weight last admission was 121 at admission, 114 at discharge. Patient responded very well to diuresis with Lasix 80mg  po bid with decrease in weight to 116 pounds.  We obtained an Echo just prior to discharge since it had been 2 years since previous and she has had two recent exacerbations.  This showed a newly worsened EF of 30-35%.  Patient already is on lisinopril and beta blocker therapy.  These were continued at discharge.  At discharge, we also increased her scheduled Lasix to 40mg  po bid  Atrial Fibrillation: on high dose beta blockade with single lead RV pacemaker in place from 2000. She is on aspirin for stroke prophylaxis. Her CHADSVASC is 7 indicating need for full anticoagulation. Has previously been on warfarin but taken off due to  difficult INR and risk of falls. Even with her fall risk, she probably would benefit from [facility] because she is high risk for embolic stroke. This discussion was had with daughter and patient at her last hospital follow up in September. And the risks/benefits of AC were discussed. Patients daughter did not want to add another blood thinner to her regimen.  We continued her ASA 81mg  daily and her metoprolol for rate control.   Discharge Vitals:   BP 122/80 mmHg  Pulse 99  Temp(Src) 98 F (36.7 C) (Oral)  Resp 18  Ht 5' (1.524 m)  Wt 116 lb 3.2 oz (52.708 kg)  BMI 22.69 kg/m2  SpO2 100%  Discharge Labs:  Results for orders placed or performed during the hospital encounter of 03/14/15 (from the past 24 hour(s))  Troponin I (q 6hr x 3)     Status: None   Collection Time: 03/14/15  1:37 PM  Result Value Ref Range   Troponin I <0.03 <0.031 ng/mL  Glucose, capillary     Status: Abnormal   Collection Time: 03/14/15  4:26 PM  Result Value Ref Range   Glucose-Capillary 168 (H) 65 - 99 mg/dL  Troponin I (q 6hr x 3)     Status: None   Collection Time: 03/14/15  7:03 PM  Result Value Ref Range   Troponin I <0.03 <0.031 ng/mL  Glucose, capillary     Status: None   Collection Time: 03/14/15  8:53 PM  Result Value Ref Range   Glucose-Capillary 89 65 - 99 mg/dL  Troponin I (q 6hr x 3)     Status: None   Collection Time: 03/15/15  1:26 AM  Result Value Ref Range   Troponin I <0.03 <0.031 ng/mL  Basic metabolic panel     Status: Abnormal   Collection Time: 03/15/15  1:26 AM  Result Value Ref Range   Sodium 136 135 - 145 mmol/L   Potassium 3.7 3.5 - 5.1 mmol/L   Chloride 100 (L) 101 - 111 mmol/L   CO2 30 22 - 32 mmol/L   Glucose, Bld 202 (H) 65 - 99 mg/dL   BUN 18 6 - 20 mg/dL   Creatinine, Ser 1.35 (H) 0.44 - 1.00 mg/dL   Calcium 9.2 8.9 - 10.3 mg/dL   GFR calc non Af Amer 34 (L) >60 mL/min   GFR calc Af Amer 40 (L) >60 mL/min   Anion gap 6 5 - 15  CBC     Status: Abnormal   Collection  Time: 03/15/15  1:26 AM  Result Value Ref Range   WBC  4.6 4.0 - 10.5 K/uL   RBC 3.52 (L) 3.87 - 5.11 MIL/uL   Hemoglobin 11.1 (L) 12.0 - 15.0 g/dL   HCT 33.3 (L) 36.0 - 46.0 %   MCV 94.6 78.0 - 100.0 fL   MCH 31.5 26.0 - 34.0 pg   MCHC 33.3 30.0 - 36.0 g/dL   RDW 15.8 (H) 11.5 - 15.5 %   Platelets 137 (L) 150 - 400 K/uL  Glucose, capillary     Status: Abnormal   Collection Time: 03/15/15  6:07 AM  Result Value Ref Range   Glucose-Capillary 158 (H) 65 - 99 mg/dL   Comment 1 Notify RN    Comment 2 Document in Chart   Glucose, capillary     Status: Abnormal   Collection Time: 03/15/15 11:08 AM  Result Value Ref Range   Glucose-Capillary 110 (H) 65 - 99 mg/dL   Comment 1 Notify RN     Signed: Jule Ser, DO 03/15/2015, 12:08 PM    Services Ordered on Discharge: none Equipment Ordered on Discharge: none

## 2015-03-15 NOTE — Discharge Instructions (Signed)
Please increase your Lasix dose to 40mg  twice per day.  A new prescription will be sent to your pharmacy.  Take your morning dose around 7 or 8am and your afternoon dose around 2 or 3pm.  Hopefully this will keep you from having to wake up in the middle of the night to use the bathroom.  Please folllow up in the Internal Medicine Center in the next 5-10 days.  I have sent a message to the Clinic front desk staff to schedule you an appointment.

## 2015-03-15 NOTE — Progress Notes (Signed)
Subjective: Patient seen and examined this morning.  No acute events overnight.  Feels much better.  No shortness of breath, leg swelling improved.  Objective: Vital signs in last 24 hours: Filed Vitals:   03/14/15 2033 03/14/15 2056 03/15/15 0500 03/15/15 0950  BP: 108/78  131/90 122/80  Pulse: 58 66 102 99  Temp: 97.7 F (36.5 C)  98 F (36.7 C)   TempSrc: Oral  Oral   Resp: 18  18   Height:      Weight:   116 lb 3.2 oz (52.708 kg)   SpO2: 100%  100%    Weight change:   Intake/Output Summary (Last 24 hours) at 03/15/15 1016 Last data filed at 03/15/15 0907  Gross per 24 hour  Intake    598 ml  Output   5000 ml  Net  -4402 ml   General: sitting up on edge of bed, no distress HEENT: EOMI, no scleral icterus Cardiac: occasional irregular rhythm, normal rate Pulm: clear to auscultation bilaterally, moving normal volumes of air Ext: warm and well perfused, bilateral edema much improved from yesterday Neuro: alert and oriented X3, cranial nerves II-XII grossly intact  Lab Results: Basic Metabolic Panel:  Recent Labs Lab 03/14/15 1024 03/15/15 0126  NA 139 136  K 3.9 3.7  CL 106 100*  CO2 25 30  GLUCOSE 93 202*  BUN 17 18  CREATININE 1.23* 1.35*  CALCIUM 9.5 9.2   Liver Function Tests: No results for input(s): AST, ALT, ALKPHOS, BILITOT, PROT, ALBUMIN in the last 168 hours. No results for input(s): LIPASE, AMYLASE in the last 168 hours. No results for input(s): AMMONIA in the last 168 hours. CBC:  Recent Labs Lab 03/14/15 1024 03/15/15 0126  WBC 3.8* 4.6  HGB 11.6* 11.1*  HCT 35.8* 33.3*  MCV 94.2 94.6  PLT 142* 137*   Cardiac Enzymes:  Recent Labs Lab 03/14/15 1337 03/14/15 1903 03/15/15 0126  TROPONINI <0.03 <0.03 <0.03   BNP: No results for input(s): PROBNP in the last 168 hours. D-Dimer: No results for input(s): DDIMER in the last 168 hours. CBG:  Recent Labs Lab 03/14/15 1626 03/14/15 2053 03/15/15 0607  GLUCAP 168* 89 158*    Hemoglobin A1C: No results for input(s): HGBA1C in the last 168 hours. Fasting Lipid Panel: No results for input(s): CHOL, HDL, LDLCALC, TRIG, CHOLHDL, LDLDIRECT in the last 168 hours. Thyroid Function Tests: No results for input(s): TSH, T4TOTAL, FREET4, T3FREE, THYROIDAB in the last 168 hours. Coagulation: No results for input(s): LABPROT, INR in the last 168 hours. Anemia Panel: No results for input(s): VITAMINB12, FOLATE, FERRITIN, TIBC, IRON, RETICCTPCT in the last 168 hours. Urine Drug Screen: Drugs of Abuse  No results found for: LABOPIA, COCAINSCRNUR, LABBENZ, AMPHETMU, THCU, LABBARB  Alcohol Level: No results for input(s): ETH in the last 168 hours. Urinalysis: No results for input(s): COLORURINE, LABSPEC, PHURINE, GLUCOSEU, HGBUR, BILIRUBINUR, KETONESUR, PROTEINUR, UROBILINOGEN, NITRITE, LEUKOCYTESUR in the last 168 hours.  Invalid input(s): APPERANCEUR  Micro Results: No results found for this or any previous visit (from the past 240 hour(s)). Studies/Results: Dg Chest 2 View  03/14/2015  CLINICAL DATA:  81 year old female with bilateral lower extremity swelling and shortness of breath EXAM: CHEST  2 VIEW COMPARISON:  Prior chest x-ray 12/14/2014 FINDINGS: Left subclavian approach cardiac rhythm maintenance device. The lead projects over the right ventricle. Stable cardiomegaly. Atherosclerotic calcifications again noted in the transverse aorta. No evidence of acute pulmonary edema, pleural effusion, focal airspace consolidation or pneumothorax. Pulmonary hyperinflation with  bronchitic change consistent with underlying COPD. No acute osseous abnormality. IMPRESSION: 1. Stable chest x-ray without evidence of acute cardiopulmonary process. 2. Stable cardiomegaly with no evidence of active pulmonary edema. 3. Hyperinflation and bronchitic changes suggest underlying COPD. Electronically Signed   By: Jacqulynn Cadet M.D.   On: 03/14/2015 10:07   Medications:  Scheduled  Meds: . aspirin EC  81 mg Oral Daily  . darifenacin  15 mg Oral Daily  . enoxaparin (LOVENOX) injection  40 mg Subcutaneous Q24H  . feeding supplement (ENSURE ENLIVE)  237 mL Oral TID  . insulin aspart  0-5 Units Subcutaneous QHS  . insulin aspart  0-9 Units Subcutaneous TID WC  . lisinopril  20 mg Oral Daily  . metoprolol  100 mg Oral BID  . potassium chloride  20 mEq Oral BID  . sodium chloride  3 mL Intravenous Q12H   Continuous Infusions:  PRN Meds:. Assessment/Plan: Principal Problem:   Acute diastolic (congestive) heart failure (HCC) Active Problems:   Hypertension   Osteoporosis   History of stroke   Atrial fibrillation, controlled (HCC)   History of sick sinus syndrome   Pacemaker-Medtronic   CKD (chronic kidney disease) stage 3, GFR 30-59 ml/min   Malnutrition of moderate degree ([place])  82 y.o. female with past medical history of chronic HFpEF, atrial fibrillation w/ SSS (pacemaker in place follows with Dr. Lovena Le - Cardiology), CKD, HTN, malnutrition, thrombocytopenia, osteoporosis who presents to the emergency department with complaint of leg swelling and shortness of breath.   Acute on Chronic HFpEF: much better today with no complaints of SOB, swelling much better.  Weight on admission of 122 down to 116 this morning with 5L of urine output - Troponins negative x 3 - BMP with mild elevation in creatine, normal electrolytes - Continue Lisinopril 20mg  po daily and Metoprolol 100mg  po bid - Will discharge on Lasix 40mg  po bid - Echo pending  Atrial Fibrillation: on high dose beta blockade with single lead RV pacemaker in place from 2000. She is on aspirin for stroke prophylaxis. Her CHADSVASC is 7.  - Continue ASA 81mg  daily - Continue metoprolol  Incontinence  - Continue Darifenacin  Malnutrion - Ensure tid  Falls - ambulates with cane - Stable with no recent falls  FEN: Fluids: none Electrolytes: KCl 7mEq po daily Nutrition: heart healthy  Code:  FULL  DVT PPx: Lovenox  Dispo: Discharge home today following echo  The patient does have a current PCP Sid Falcon, MD) and does need an [facility] hospital follow-up appointment after discharge.  The patient does have transportation limitations that hinder transportation to clinic appointments.  .Services Needed at time of discharge: Y = Yes, Blank = No PT:   OT:   RN:   Equipment:   Other:       Jule Ser, DO 03/15/2015, 10:16 AM

## 2015-03-20 ENCOUNTER — Encounter: Payer: Self-pay | Admitting: Gastroenterology

## 2015-03-24 ENCOUNTER — Other Ambulatory Visit: Payer: Self-pay | Admitting: Internal Medicine

## 2015-03-24 ENCOUNTER — Encounter: Payer: Self-pay | Admitting: Gastroenterology

## 2015-03-26 ENCOUNTER — Ambulatory Visit (INDEPENDENT_AMBULATORY_CARE_PROVIDER_SITE_OTHER): Payer: Commercial Managed Care - HMO | Admitting: Internal Medicine

## 2015-03-26 ENCOUNTER — Encounter: Payer: Self-pay | Admitting: Internal Medicine

## 2015-03-26 VITALS — BP 142/70 | HR 83 | Ht 60.0 in | Wt 124.8 lb

## 2015-03-26 DIAGNOSIS — I5032 Chronic diastolic (congestive) heart failure: Secondary | ICD-10-CM

## 2015-03-26 DIAGNOSIS — Z950 Presence of cardiac pacemaker: Secondary | ICD-10-CM

## 2015-03-26 DIAGNOSIS — I10 Essential (primary) hypertension: Secondary | ICD-10-CM | POA: Diagnosis not present

## 2015-03-26 DIAGNOSIS — I4891 Unspecified atrial fibrillation: Secondary | ICD-10-CM

## 2015-03-26 NOTE — Assessment & Plan Note (Signed)
Her blood pressure is under better control. Will follow.

## 2015-03-26 NOTE — Patient Instructions (Signed)
Medication Instructions:  Your physician recommends that you continue on your current medications as directed. Please refer to the Current Medication list given to you today.   Labwork: None ordered   Testing/Procedures: None ordered   Follow-Up: Your physician wants you to follow-up in: 6 months in the device clinic and 12 months with Dr Taylor You will receive a reminder letter in the mail two months in advance. If you don't receive a letter, please call our office to schedule the follow-up appointment.   Any Other Special Instructions Will Be Listed Below (If Applicable).     If you need a refill on your cardiac medications before your next appointment, please call your pharmacy.   

## 2015-03-26 NOTE — Progress Notes (Signed)
PCP: Gilles Chiquito, MD  [name] is a 81 y.o. female who presents today for routine electrophysiology followup. She was hospitalized back in November with acute on chronic diastolic heart failure, mostly right sided and responded to intravenous diuretic therapy. Since then, she has done well, and denies syncope, and denies chest pain or shortness of breath..  Today, she denies symptoms of palpitations, chest pain, shortness of breath,  lower extremity edema, dizziness, presyncope, or syncope.  The patient is otherwise without complaint today.   Past Medical History  Diagnosis Date  . Weight loss, unintentional 2010-2011    8/08-8/10 138-146 lbs, between 8/10-10/11 lost 18 lbs., work-up negative  . Incontinence overflow, stress female     on vesicare  . History of sick sinus syndrome s/p pacemaker Dr. Lovena Le 2000  . Atrial fibrillation (HCC)     on coumadin followed by Dr. Elie Confer  . History of vaginal bleeding 05/2005    nagative endometrial biopsy  . Restrictive lung disease 1996    PFT's showed mild disease  . Anemia     Iron deficiency, does not tolerate po  . CHF (congestive heart failure) (HCC)     diastolic dysfunction  . Hypertension     well controlled on 3 agents  . Osteoporosis   . Allergic rhinitis   . Onychomycosis   . History of trichomonal urethritis   . Constipation   . Diverticulosis of colon (without mention of hemorrhage)   . Pacemaker   . Type II diabetes mellitus (HCC)     diet controlled  . Stroke [facility]) 2002    R MCA, cardioembolic  . Arthritis     "all over my body"  . DJD (degenerative joint disease)   . GERD (gastroesophageal reflux disease)   . Adenomatous colon polyp     tubular   Past Surgical History  Procedure Laterality Date  . Cholecystectomy    . Cataract extraction    . Abdominal hysterectomy    . Pacemaker generator change  10/2012    AdaptaL 10/2012 by Dr Lovena Le  . Insert / replace / remove pacemaker  2000    MDT Sigma pacemaker  implanted by Dr Lovena Le;  . Pacemaker generator change N/A 11/02/2012    Procedure: PACEMAKER GENERATOR CHANGE;  Surgeon: Evans Lance, MD;  Location: [facility] CATH LAB;  Service: Cardiovascular;  Laterality: N/A;  . Esophagogastroduodenoscopy    . Colonoscopy      Current Outpatient Prescriptions  Medication Sig Dispense Refill  . aspirin EC 81 MG tablet Take 81 mg by mouth daily.    . cetirizine (ZYRTEC) 1 MG/ML syrup Take 5 mLs (5 mg total) by mouth daily. 118 mL 12  . furosemide (LASIX) 20 MG tablet TAKE 2 TABLETS BY MOUTH TWICE DAILY 360 tablet 3  . lisinopril (PRINIVIL,ZESTRIL) 20 MG tablet TAKE 1 TABLET BY MOUTH DAILY 30 tablet 5  . metoprolol (LOPRESSOR) 100 MG tablet Take 1 tablet (100 mg total) by mouth 2 (two) times daily. 60 tablet 3  . Nutritional Supplements ([place]) LIQD Take 237 mLs by mouth 3 (three) times daily. 90 Bottle 0  . potassium chloride 20 MEQ/15ML (10%) SOLN TK 15 MLS PO D  1  . VESICARE 10 MG tablet TAKE 1 TABLET BY MOUTH EVERY DAY 90 tablet 0   No current facility-administered medications for this visit.    Physical Exam: Filed Vitals:   03/26/15 1052  BP: 142/70  Pulse: 83  Height: 5' (1.524 m)  Weight: 124 lb 12.8 oz (56.609 kg)  SpO2: 99%    GEN- The patient is elderly appearing, alert and oriented x 3 today.   Head- normocephalic, atraumatic Eyes-  Sclera clear, conjunctiva pink Ears- hearing intact Oropharynx- clear Lungs- Clear to ausculation bilaterally, normal work of breathing Chest- pacemaker pocket is well healed Heart- IRegular rate and rhythm, no murmurs, rubs or gallops, PMI not laterally displaced GI- soft, NT, ND, + BS Extremities- no clubbing, cyanosis, or edema  Pacemaker interrogation- reviewed in detail today,  See PACEART report   Assessment and Plan:

## 2015-03-26 NOTE — Assessment & Plan Note (Signed)
Her medtronic PPM is working normally. Will recheck in several months. 

## 2015-03-26 NOTE — Assessment & Plan Note (Signed)
Her symptoms are class 2. She is encouraged to maintain a low sodium diet. 

## 2015-03-26 NOTE — Assessment & Plan Note (Signed)
Her ventricular rate is controlled. She will continue ASA. She is not a candidate for systemic anti-coagulation secondary to falls.

## 2015-03-27 ENCOUNTER — Ambulatory Visit: Payer: Commercial Managed Care - HMO | Admitting: Internal Medicine

## 2015-04-06 LAB — CUP PACEART INCLINIC DEVICE CHECK
Battery Impedance: 305 Ohm
Battery Voltage: 2.79 V
Date Time Interrogation Session: 20161208170622
Implantable Lead Implant Date: 20001214
Implantable Lead Model: 5092
Lead Channel Pacing Threshold Pulse Width: 0.4 ms
Lead Channel Setting Pacing Pulse Width: 0.4 ms
Lead Channel Setting Sensing Sensitivity: 4 mV
MDC IDC LEAD LOCATION: 753860
MDC IDC MSMT BATTERY REMAINING LONGEVITY: 100 mo
MDC IDC MSMT LEADCHNL RA IMPEDANCE VALUE: 0 Ohm
MDC IDC MSMT LEADCHNL RV IMPEDANCE VALUE: 593 Ohm
MDC IDC MSMT LEADCHNL RV PACING THRESHOLD AMPLITUDE: 0.75 V
MDC IDC MSMT LEADCHNL RV SENSING INTR AMPL: 11.2 mV
MDC IDC SET LEADCHNL RV PACING AMPLITUDE: 2 V
MDC IDC STAT BRADY RV PERCENT PACED: 42 %

## 2015-04-22 ENCOUNTER — Encounter: Payer: Self-pay | Admitting: Gastroenterology

## 2015-04-24 ENCOUNTER — Other Ambulatory Visit: Payer: Self-pay | Admitting: Internal Medicine

## 2015-05-20 ENCOUNTER — Ambulatory Visit (INDEPENDENT_AMBULATORY_CARE_PROVIDER_SITE_OTHER): Payer: Commercial Managed Care - HMO | Admitting: Internal Medicine

## 2015-05-20 ENCOUNTER — Encounter: Payer: Self-pay | Admitting: Internal Medicine

## 2015-05-20 VITALS — BP 121/59 | HR 68 | Temp 97.9°F | Wt 123.6 lb

## 2015-05-20 DIAGNOSIS — N183 Chronic kidney disease, stage 3 unspecified: Secondary | ICD-10-CM

## 2015-05-20 DIAGNOSIS — K629 Disease of anus and rectum, unspecified: Secondary | ICD-10-CM

## 2015-05-20 DIAGNOSIS — I5032 Chronic diastolic (congestive) heart failure: Secondary | ICD-10-CM | POA: Diagnosis not present

## 2015-05-20 DIAGNOSIS — K6289 Other specified diseases of anus and rectum: Secondary | ICD-10-CM

## 2015-05-20 DIAGNOSIS — Z9114 Patient's other noncompliance with medication regimen: Secondary | ICD-10-CM

## 2015-05-20 DIAGNOSIS — H9193 Unspecified hearing loss, bilateral: Secondary | ICD-10-CM

## 2015-05-20 DIAGNOSIS — I10 Essential (primary) hypertension: Secondary | ICD-10-CM

## 2015-05-20 DIAGNOSIS — R159 Full incontinence of feces: Secondary | ICD-10-CM

## 2015-05-20 DIAGNOSIS — D649 Anemia, unspecified: Secondary | ICD-10-CM | POA: Diagnosis not present

## 2015-05-20 DIAGNOSIS — I4891 Unspecified atrial fibrillation: Secondary | ICD-10-CM

## 2015-05-20 NOTE — Patient Instructions (Signed)
[name] - -   For your cough, please get a cough medication over the counter from the [employer] brand which will not raise your blood pressure.    For your hearing, I will place a referral to Audiology.   Please make an appointment with me in about 4-5 months.    Thank you!

## 2015-05-20 NOTE — Progress Notes (Signed)
   Subjective:    Patient ID: [name], female    DOB: 26-Jun-1925, 82 y.o.   MRN: [id number]  CC: cough, difficulty swallowing  HPI  [name] is an 82yo woman with PMH of CHF, anemia, CKD, GERD, HTN, thrombocytopenia who presents for follow up after hospitalization.   [name] was hospitalized in December of 2016 for a HF exacerbation.  She was discharged on a higher dose of lasix to help with swelling and SOB.  She notes that she has been doing well since being discharged, but that she sometimes doesn't take her afternoon dose of lasix because she has to go the bathroom too much.  She has been taking her other medications including lisinopril and metoprolol and asa.  Today she notes that she does have some swelling in her ankles which is bothersome.   She further reports that she is getting over what she thought was the flu, but turned out to be a bad cold.  She has a cough which is bothersome and dry.  She is curious about getting some cough medicine.  During the cold, she had a sore throat and difficulty swallowing, but she notes that this has gotten much better.    [name] has been having trouble with diarrhea and fecal incontinence for quite a while.  It has waxed and waned and at last visit she noted it was better.  She was due to see a GI physician, but her issues with heart failure superseded these appointments.  She is due to see GI now near the end of this month.    I was reviewing her labs and noted an anemia that has not been followed up on in awhile. She reports (daughter confirms) that she has "always" been anemic.  She denies any excessive fatigue, lightheadedness, falls.    She did not bring in her medications.  She is not smoking.   Patient further requests referral to audiology for possible hearing aids.   Review of Systems  Constitutional: Negative for fever, chills and fatigue.  Respiratory: Positive for cough. Negative for chest tightness and shortness of  breath.   Cardiovascular: Positive for leg swelling. Negative for chest pain.  Gastrointestinal: Positive for diarrhea. Negative for abdominal pain, anal bleeding and rectal pain.  Genitourinary: Positive for frequency. Negative for dysuria.  Musculoskeletal: Positive for arthralgias.  Neurological: Negative for dizziness and light-headedness.       Objective:   Physical Exam  Constitutional:  She is a thin elderly woman, NAD  HENT:  Head: Normocephalic and atraumatic.  Eyes: Conjunctivae are normal. No scleral icterus.  No conjunctival pallor noted  Neck: Normal range of motion. Neck supple.  Cardiovascular: Normal rate and regular rhythm.   No murmur heard. Pulmonary/Chest: Effort normal and breath sounds normal. No respiratory distress. She has no wheezes.  Abdominal: Soft. Bowel sounds are normal. She exhibits no distension.  Musculoskeletal: She exhibits edema (ankle, bilaterally). She exhibits no tenderness.    CMET, CBC, iron studies.       Assessment & Plan:  RTC in 4 months.

## 2015-05-21 LAB — CBC WITH DIFFERENTIAL/PLATELET
BASOS: 1 %
Basophils Absolute: 0 10*3/uL (ref 0.0–0.2)
EOS (ABSOLUTE): 0.1 10*3/uL (ref 0.0–0.4)
EOS: 2 %
HEMATOCRIT: 35.5 % (ref 34.0–46.6)
HEMOGLOBIN: 11.4 g/dL (ref 11.1–15.9)
Immature Grans (Abs): 0 10*3/uL (ref 0.0–0.1)
Immature Granulocytes: 0 %
LYMPHS ABS: 1.2 10*3/uL (ref 0.7–3.1)
Lymphs: 33 %
MCH: 30.3 pg (ref 26.6–33.0)
MCHC: 32.1 g/dL (ref 31.5–35.7)
MCV: 94 fL (ref 79–97)
MONOCYTES: 16 %
MONOS ABS: 0.6 10*3/uL (ref 0.1–0.9)
NEUTROS ABS: 1.7 10*3/uL (ref 1.4–7.0)
Neutrophils: 48 %
Platelets: 176 10*3/uL (ref 150–379)
RBC: 3.76 x10E6/uL — ABNORMAL LOW (ref 3.77–5.28)
RDW: 15.8 % — AB (ref 12.3–15.4)
WBC: 3.6 10*3/uL (ref 3.4–10.8)

## 2015-05-21 LAB — CMP14 + ANION GAP
ALBUMIN: 3.9 g/dL (ref 3.5–4.7)
ALK PHOS: 158 IU/L — AB (ref 39–117)
ALT: 12 IU/L (ref 0–32)
ANION GAP: 15 mmol/L (ref 10.0–18.0)
AST: 27 IU/L (ref 0–40)
Albumin/Globulin Ratio: 1.1 (ref 1.1–2.5)
BILIRUBIN TOTAL: 1.5 mg/dL — AB (ref 0.0–1.2)
BUN / CREAT RATIO: 13 (ref 11–26)
BUN: 16 mg/dL (ref 8–27)
CO2: 25 mmol/L (ref 18–29)
CREATININE: 1.24 mg/dL — AB (ref 0.57–1.00)
Calcium: 9.1 mg/dL (ref 8.7–10.3)
Chloride: 96 mmol/L (ref 96–106)
GFR calc non Af Amer: 39 mL/min/{1.73_m2} — ABNORMAL LOW (ref >59)
GFR, EST AFRICAN AMERICAN: 45 mL/min/{1.73_m2} — AB (ref >59)
GLOBULIN, TOTAL: 3.5 g/dL (ref 1.5–4.5)
Glucose: 115 mg/dL — ABNORMAL HIGH (ref 65–99)
Potassium: 4.1 mmol/L (ref 3.5–5.2)
SODIUM: 136 mmol/L (ref 134–144)
TOTAL PROTEIN: 7.4 g/dL (ref 6.0–8.5)

## 2015-05-21 LAB — IRON AND TIBC
Iron Saturation: 33 % (ref 15–55)
Iron: 108 ug/dL (ref 27–139)
Total Iron Binding Capacity: 323 ug/dL (ref 250–450)
UIBC: 215 ug/dL (ref 118–369)

## 2015-05-21 LAB — FERRITIN: Ferritin: 59 ng/mL (ref 15–150)

## 2015-05-26 NOTE — Assessment & Plan Note (Signed)
Referral to Audiology for possible hearing aids today.  She has been evaluated before but did not want to spend the money to buy aides.

## 2015-05-26 NOTE — Assessment & Plan Note (Signed)
She has no urinary complaints today.  She does have some mild volume overload, but this is explained by noncompliance with dose of lasix.  Labs today showed a Cr which is within range of her baseline at 1.24.    Continue renal protection with lisinopril and good BP control.    Monitor renal function at least yearly.

## 2015-05-26 NOTE — Assessment & Plan Note (Signed)
BP well controlled today.  CMET with stable renal function.  No headaches or lightheadedness.   Continue lisinopril, metoprolol and lasix.

## 2015-05-26 NOTE — Assessment & Plan Note (Signed)
Noted on blood work.  H/H somewhat improved compared to 2 months ago.  Iron studies normal.  This is likely in some part related to her declining nutrition and appetite.  This issue has improved, which explains why her counts are also improved.   Monitor for change or acute blood loss.    No iron supplementation is indicated today.

## 2015-05-26 NOTE — Assessment & Plan Note (Signed)
Follows with Dr. Lovena Le.  Last seen in December.  She is maintained on aspirin.

## 2015-05-26 NOTE — Assessment & Plan Note (Signed)
This has been an issue for a while, has waxed and waned.  She reports recurrence of the issue.  Does not have leg weakness or pain in her legs.  No anesthesia perianally.  She declined rectal exam as she has an appointment with GI in a few weeks.   Plan  F/U with GI as previously planned.

## 2015-05-26 NOTE — Assessment & Plan Note (Signed)
She is doing well, taking her medications except she sometimes misses her afternoon dose of lasix.    We discussed options to decrease the chance of needing to urinate at night by taking the afternoon dose of lasix at 2pm.    She will try this.  We also discussed return precautions.  She currently lives alone.

## 2015-06-10 NOTE — Addendum Note (Signed)
Addended by: Hulan Fray on: 06/10/2015 07:04 PM   Modules accepted: Orders

## 2015-06-11 ENCOUNTER — Ambulatory Visit: Payer: Commercial Managed Care - HMO | Admitting: Gastroenterology

## 2015-06-11 ENCOUNTER — Telehealth: Payer: Self-pay | Admitting: Gastroenterology

## 2015-06-12 NOTE — Telephone Encounter (Signed)
no

## 2015-06-23 ENCOUNTER — Ambulatory Visit: Payer: Commercial Managed Care - HMO | Admitting: Internal Medicine

## 2015-06-23 ENCOUNTER — Other Ambulatory Visit: Payer: Self-pay | Admitting: Internal Medicine

## 2015-06-23 ENCOUNTER — Telehealth: Payer: Self-pay | Admitting: Internal Medicine

## 2015-06-23 NOTE — Telephone Encounter (Signed)
No answer at pt's ph, she is not in clinic yet, will continue to call

## 2015-06-23 NOTE — Telephone Encounter (Signed)
Pt states she has a cough, had a fever for 1 week but it is now gone, cough is very congested, denies N&V. She is somewhat hard to understand. Offered an appt for this pm at 1515, will call her back in appr 20 mins to confirm

## 2015-06-23 NOTE — Telephone Encounter (Signed)
Pt Daughter states that she has flu like symptoms. Offered and appt for the pt but denied. Wanted to talk to nurse about what to take over the counter.

## 2015-06-23 NOTE — Telephone Encounter (Signed)
Thank you Bonnita Nasuti... Would advise she be seen in [facility]

## 2015-06-26 ENCOUNTER — Ambulatory Visit (HOSPITAL_COMMUNITY)
Admission: RE | Admit: 2015-06-26 | Discharge: 2015-06-26 | Disposition: A | Discharge status: Home / Self Care | Payer: Commercial Managed Care - HMO | Source: Ambulatory Visit | Attending: Student in an Organized Health Care Education/Training Program | Admitting: Student in an Organized Health Care Education/Training Program

## 2015-06-26 ENCOUNTER — Encounter: Payer: Self-pay | Admitting: Internal Medicine

## 2015-06-26 ENCOUNTER — Ambulatory Visit (INDEPENDENT_AMBULATORY_CARE_PROVIDER_SITE_OTHER): Payer: Commercial Managed Care - HMO | Admitting: Internal Medicine

## 2015-06-26 VITALS — BP 127/57 | HR 72 | Temp 98.2°F | Wt 118.9 lb

## 2015-06-26 DIAGNOSIS — R05 Cough: Secondary | ICD-10-CM | POA: Insufficient documentation

## 2015-06-26 DIAGNOSIS — R058 Other specified cough: Secondary | ICD-10-CM

## 2015-06-26 DIAGNOSIS — R509 Fever, unspecified: Secondary | ICD-10-CM | POA: Diagnosis not present

## 2015-06-26 MED ORDER — DM-GUAIFENESIN ER 30-600 MG PO TB12: 1.0000 | ORAL_TABLET | Freq: Two times a day (BID) | ORAL | Status: DC

## 2015-06-26 NOTE — Assessment & Plan Note (Addendum)
1 week for productive cough along with subjective fever + rhinorrhea + diarrhea. Likely a viral infection. However, since her cough is productive and she has subjective fever, we will do a CXR to r/o PNA.  - will treat supportively with mucinex-DM for cough and chest congestion for now. If CXR shows PNA, we will call in PO abx. She does not appear to be septic so at this moment we can manage this as outpatient.  - asked her to take lasix 40mg  only once a day for next few days until her diarrhea gets better. Then she can go back to BID dosing. This is to help her avoid getting dehydrated.   Addendum: cxr negative for any infiltrate. Cont supportive care.

## 2015-06-26 NOTE — Progress Notes (Signed)
   Subjective:    Patient ID: [name], female    DOB: 18-Oct-1925, 82 y.o.   MRN: [id number]  HPI  82 yo female with hx of Afib with SSS s/p pace maker, CKD III, HTN, chronic combined CHF EF 30-35%, presents with cough for 1 week.  Cough is productive of white thick sputum, she feels some chest congestion and cannot clear it out completely with coughing. Had some subjective fever. Also having runny nose and nonbloody diarrhea (2-3x/daily) for 1 week. daughter was sick recently. Has some SOB with this coughing. No n/v/, dysuria, weight changes. Still taking her lasix when she is having the diarrhea BID, has some dizziness upon standing. No recent travel. Has not tried      Review of Systems  Constitutional: Positive for fever and chills.  HENT: Positive for congestion and rhinorrhea. Negative for ear discharge, ear pain, sinus pressure, sneezing and sore throat.   Eyes: Negative for photophobia and visual disturbance.  Respiratory: Positive for cough and shortness of breath. Negative for apnea, chest tightness and wheezing.   Cardiovascular: Negative for chest pain, palpitations and leg swelling.  Gastrointestinal: Positive for diarrhea. Negative for nausea, vomiting, abdominal pain, blood in stool and abdominal distention.  Endocrine: Negative.   Genitourinary: Negative for dysuria and flank pain.  Musculoskeletal: Negative.   Skin: Negative.   Allergic/Immunologic: Negative.   Neurological: Positive for dizziness. Negative for headaches.  Hematological: Negative.   Psychiatric/Behavioral: Negative.        Objective:   Physical Exam  Constitutional: She is oriented to person, place, and time. She appears well-developed and well-nourished. No distress.  HENT:  Head: Normocephalic and atraumatic.  Eyes: EOM are normal. Pupils are equal, round, and reactive to light.  Unable to visualize posterior oropharynx.  Neck: Normal range of motion.  Cardiovascular: Normal rate and  regular rhythm.  Exam reveals no gallop and no friction rub.   No murmur heard. Pulmonary/Chest: Effort normal and breath sounds normal. No respiratory distress. She has no wheezes. She exhibits no tenderness.  Abdominal: Soft. Bowel sounds are normal. She exhibits no distension. There is no tenderness.  Musculoskeletal: Normal range of motion. She exhibits no edema or tenderness.  Neurological: She is alert and oriented to person, place, and time. No cranial nerve deficit.  Skin: She is not diaphoretic.     Filed Vitals:   06/26/15 1439  BP: 127/57  Pulse: 72  Temp: 98.2 F (36.8 C)        Assessment & Plan:  See problem based a&p

## 2015-06-26 NOTE — Patient Instructions (Signed)
Please take mucinex-DM for our cough and chest congestion.   We will check a chest xray to make sure you don't have pneumonia. I suspect this is a viral infection that will be getter by itself in few days. Take tylenol as needed for fever. If you feel worse or not better in 1 week please call us.  Take the lasix only once a day until your diarrhea gets better. Then you can take it like you are now.

## 2015-06-30 NOTE — Progress Notes (Signed)
Internal Medicine Clinic Attending  Case discussed with Dr. Ahmed at the time of the visit.  We reviewed the resident's history and exam and pertinent patient test results.  I agree with the assessment, diagnosis, and plan of care documented in the resident's note. 

## 2015-08-06 ENCOUNTER — Encounter: Payer: Self-pay | Admitting: Gastroenterology

## 2015-08-06 ENCOUNTER — Ambulatory Visit (INDEPENDENT_AMBULATORY_CARE_PROVIDER_SITE_OTHER): Payer: Commercial Managed Care - HMO | Admitting: Gastroenterology

## 2015-08-06 VITALS — BP 124/70 | HR 70 | Ht 60.0 in | Wt 114.2 lb

## 2015-08-06 DIAGNOSIS — R159 Full incontinence of feces: Secondary | ICD-10-CM

## 2015-08-06 DIAGNOSIS — R197 Diarrhea, unspecified: Secondary | ICD-10-CM | POA: Diagnosis not present

## 2015-08-06 DIAGNOSIS — R7989 Other specified abnormal findings of blood chemistry: Secondary | ICD-10-CM

## 2015-08-06 DIAGNOSIS — R945 Abnormal results of liver function studies: Secondary | ICD-10-CM

## 2015-08-06 NOTE — Patient Instructions (Addendum)
Food Guidelines for a sensitive stomach  Many people have difficulty digesting certain foods, causing a variety of distressing and embarrassing symptoms such as abdominal pain, bloating and gas.  These foods may need to be avoided or consumed in small amounts.  Here are some tips that might be helpful for you.  1.   Lactose intolerance is the difficulty or complete inability to digest lactose, the natural sugar in milk and anything made from milk.  This condition is harmless, common, and can begin any time during life.  Some people can digest a modest amount of lactose while others cannot tolerate any.  Also, not all dairy products contain equal amounts of lactose.  For example, hard cheeses such as parmesan have less lactose than soft cheeses such as cheddar.  Yogurt has less lactose than milk or cheese.  Many packaged foods (even many brands of bread) have milk, so read ingredient lists carefully.  It is difficult to test for lactose intolerance, so just try avoiding lactose as much as possible for a week and see what happens with your symptoms.  If you seem to be lactose intolerant, the best plan is to avoid it (but make sure you get calcium from another source).  The next best thing is to use lactase enzyme supplements, available over the counter everywhere.  Just know that many lactose intolerant people need to take several tablets with each serving of dairy to avoid symptoms.  Lastly, a lot of restaurant food is made with milk or butter.  Many are things you might not suspect, such as mashed potatoes, rice and pasta (cooked with butter) and "grilled" items.  If you are lactose intolerant, it never hurts to ask your server what has milk or butter.  2.   Fiber is an important part of your diet, but not all fiber is well-tolerated.  Insoluble fiber such as bran is often consumed by normal gut bacteria and converted into gas.  Soluble fiber such as oats, squash, carrots and green beans are typically  tolerated better.  3.   Some types of carbohydrates can be poorly digested.  Examples include: fructose (apples, cherries, pears, raisins and other dried fruits), fructans (onions, zucchini, large amounts of wheat), sorbitol/mannitol/xylitol and sucralose/Splenda (common artificial sweeteners), and raffinose (lentils, broccoli, cabbage, asparagus, brussel sprouts, many types of beans).  Do a Development worker, community for [employer] and you will find helpful information. Beano, a dietary supplement, will often help with raffinose-containing foods.  As with lactase tablets, you may need several per serving.  4.   Whenever possible, avoid processed food&meats and chemical additives.  High fructose corn syrup, a common sweetener, may be difficult to digest.  Eggs and soy (comes from the soybean, and added to many foods now) are the other most common bloating/gassy foods.  - Dr. [facility]  If you are age 80 or older, your body mass index should be between 23-30. Your Body mass index is 22.3 kg/(m^2). If this is out of the aforementioned range listed, please consider follow up with your Primary Care Provider.  If you are age 80 or younger, your body mass index should be between 19-25. Your Body mass index is 22.3 kg/(m^2). If this is out of the aformentioned range listed, please consider follow up with your Primary Care Provider.    Please take 2 imodium tablets once every morning to reduce your diarrhea.  If you do not move your bowels for 2 days, stop the imodium until  your bowel movements start again.  Please call us in 2-3 weeks with an update on your condition. [phone] ask for Christian Mate RN  Thank you for choosing [facility]  Dr Wilfrid Lund III

## 2015-08-06 NOTE — Progress Notes (Signed)
[facility] Consult Note:  History: [name] 08/06/2015  Referring physician: Gilles Chiquito, MD  Reason for consult/chief complaint: Encopresis   Subjective HPI:  This is the initial office consult for an 82 year old woman referred for diarrhea with fecal incontinence. She is with her daughter today, but I'm afraid it'll and is a rather poor historian. She denies abdominal pain, but just reports that for several months she has had 1 or 2 episodes per day of loose stool with no blood, but she is incontinent at times. She recalls no medication changes or other possible triggers, and has noticed no particular dietary triggers. Overall, she appears to be in fairly poor health with  congestive heart failure. She is really not sure if she has lost any weight recently.   ROS:  Review of Systems  Constitutional: Positive for appetite change.  HENT: Negative for mouth sores and voice change.   Eyes: Negative for pain and redness.  Respiratory: Positive for shortness of breath. Negative for cough.   Cardiovascular: Positive for leg swelling. Negative for chest pain and palpitations.  Genitourinary: Negative for dysuria and hematuria.  Musculoskeletal: Negative for myalgias and arthralgias.  Skin: Negative for pallor and rash.  Neurological: Negative for weakness and headaches.  Hematological: Negative for adenopathy.     Past Medical History: Past Medical History  Diagnosis Date  . Weight loss, unintentional 2010-2011    8/08-8/10 138-146 lbs, between 8/10-10/11 lost 18 lbs., work-up negative  . Incontinence overflow, stress female     on vesicare  . History of sick sinus syndrome s/p pacemaker Dr. Lovena Le 2000  . Atrial fibrillation (HCC)     on coumadin followed by Dr. Elie Confer  . History of vaginal bleeding 05/2005    nagative endometrial biopsy  . Restrictive lung disease 1996    PFT's showed mild disease  . Anemia     Iron deficiency, does not tolerate po   . CHF (congestive heart failure) (HCC)     diastolic dysfunction  . Hypertension     well controlled on 3 agents  . Osteoporosis   . Allergic rhinitis   . Onychomycosis   . History of trichomonal urethritis   . Constipation   . Diverticulosis of colon (without mention of hemorrhage)   . Pacemaker   . Type II diabetes mellitus (HCC)     diet controlled  . Stroke [facility]) 2002    R MCA, cardioembolic  . Arthritis     "all over my body"  . DJD (degenerative joint disease)   . GERD (gastroesophageal reflux disease)   . Adenomatous colon polyp     tubular     Past Surgical History: Past Surgical History  Procedure Laterality Date  . Cholecystectomy    . Cataract extraction    . Abdominal hysterectomy    . Pacemaker generator change  10/2012    AdaptaL 10/2012 by Dr Lovena Le  . Insert / replace / remove pacemaker  2000    MDT Sigma pacemaker implanted by Dr Lovena Le;  . Pacemaker generator change N/A 11/02/2012    Procedure: PACEMAKER GENERATOR CHANGE;  Surgeon: Evans Lance, MD;  Location: [facility] CATH LAB;  Service: Cardiovascular;  Laterality: N/A;  . Esophagogastroduodenoscopy    . Colonoscopy       Family History: Family History  Problem Relation Age of Onset  . Diabetes Sister   . Heart disease      Social History: Social History   Social History  . Marital Status: Widowed  Spouse Name: N/A  . Number of Children: 8  . Years of Education: N/A   Occupational History  . retired    Social History Main Topics  . Smoking status: Never Smoker   . Smokeless tobacco: Never Used  . Alcohol Use: 0.0 oz/week    0 Standard drinks or equivalent per week     Comment: occasionally.  . Drug Use: No  . Sexual Activity: No   Other Topics Concern  . None   Social History Narrative   [employer]   [name] home# [phone]   Cell# [phone]      Patient lives alone, but has 7, of 8, living children who check in on her.   She is widowed.    Allergies: No Known  Allergies  Outpatient Meds: Current Outpatient Prescriptions  Medication Sig Dispense Refill  . aspirin EC 81 MG tablet Take 81 mg by mouth daily.    . cetirizine (ZYRTEC) 1 MG/ML syrup Take 5 mLs (5 mg total) by mouth daily. 118 mL 12  . dextromethorphan-guaiFENesin (MUCINEX DM) 30-600 MG 12hr tablet Take 1 tablet by mouth 2 (two) times daily. 30 tablet 0  . furosemide (LASIX) 20 MG tablet TAKE 2 TABLETS BY MOUTH TWICE DAILY 360 tablet 3  . lisinopril (PRINIVIL,ZESTRIL) 20 MG tablet TAKE 1 TABLET BY MOUTH DAILY 30 tablet 5  . metoprolol (LOPRESSOR) 100 MG tablet TAKE 1 TABLET(100 MG) BY MOUTH TWICE DAILY 60 tablet 6  . Nutritional Supplements ([place]) LIQD Take 237 mLs by mouth 3 (three) times daily. 90 Bottle 0  . potassium chloride 20 MEQ/15ML (10%) SOLN TK 15 MLS PO D  1  . VESICARE 10 MG tablet TAKE 1 TABLET BY MOUTH EVERY DAY 90 tablet 0   No current facility-administered medications for this visit.      ___________________________________________________________________ Objective  Exam:  BP 124/70 mmHg  Pulse 70  Ht 5' (1.524 m)  Wt 114 lb 3.2 oz (51.801 kg)  BMI 22.30 kg/m2  SpO2 99%   General: this is a(n) Feeble elderly woman with poor muscle mass. She cannot get out of the wheelchair.   Eyes: sclera anicteric, no redness  ENT: oral mucosa moist without lesions, no cervical or supraclavicular lymphadenopathy, good dentition. Temporal wasting  CV: RRR without murmur, S1/S2, difficult to ascertain if JVD, since she is sitting at 90, mild bilateral pretibial edema  Resp: clear to auscultation bilaterally, normal RR and effort noted  GI: soft, no tenderness, with active bowel sounds. No guarding or palpable organomegaly noted.  Skin; warm and dry, no rash or jaundice noted  Neuro: awake, alert and oriented x 3. Normal gross motor function and fluent speech  Labs:   CMP Latest Ref Rng 05/20/2015 03/15/2015 03/14/2015  Glucose 65 - 99 mg/dL  115(H) 202(H) 93  BUN 8 - 27 mg/dL 16 18 17   Creatinine 0.57 - 1.00 mg/dL 1.24(H) 1.35(H) 1.23(H)  Sodium 134 - 144 mmol/L 136 136 139  Potassium 3.5 - 5.2 mmol/L 4.1 3.7 3.9  Chloride 96 - 106 mmol/L 96 100(L) 106  CO2 18 - 29 mmol/L 25 30 25   Calcium 8.7 - 10.3 mg/dL 9.1 9.2 9.5  Total Protein 6.0 - 8.5 g/dL 7.4 - -  Total Bilirubin 0.0 - 1.2 mg/dL 1.5(H) - -  Alkaline Phos 39 - 117 IU/L 158(H) - -  AST 0 - 40 IU/L 27 - -  ALT 0 - 32 IU/L 12 - -   Lab Results  Component Value  Date   WBC 3.6 05/20/2015   HGB 11.1* 03/15/2015   HCT 35.5 05/20/2015   MCV 94 05/20/2015   PLT 176 05/20/2015     Assessment: Encounter Diagnoses  Name Primary?  . Diarrhea, unspecified type Yes  . Fecal incontinence   . LFT elevation    Difficult to tell what is causing this diarrhea and she is a somewhat vague historian, and her poor overall condition includes extensive workup. It does not appear to be any of her current medicines, doubtful to be infection, and unlikely new onset IBD at this age. He could be microscopic colitis, but I don't think she is a good candidate for colonoscopy. Overall, it does not sound severe and I don't think it is a small bowel malabsorption problem. Her elevated LFTs are likely mild passive hepatic congestion from CHF.  Plan:  Some dietary advice was given, and she will use when necessary Imodium. Her daughter will call us in 2 or 3 weeks with an update.  Thank you for the courtesy of this consult.  Please call me with any questions or concerns.  Nelida Meuse III

## 2015-08-20 ENCOUNTER — Other Ambulatory Visit: Payer: Self-pay | Admitting: Internal Medicine

## 2015-09-21 ENCOUNTER — Other Ambulatory Visit: Payer: Self-pay | Admitting: Internal Medicine

## 2015-09-21 DIAGNOSIS — Z1231 Encounter for screening mammogram for malignant neoplasm of breast: Secondary | ICD-10-CM

## 2015-09-29 ENCOUNTER — Other Ambulatory Visit: Payer: Self-pay | Admitting: Internal Medicine

## 2015-09-30 ENCOUNTER — Ambulatory Visit: Payer: Commercial Managed Care - HMO

## 2015-10-06 ENCOUNTER — Telehealth: Payer: Self-pay

## 2015-10-06 NOTE — Telephone Encounter (Signed)
Pt has appt in am, daughter wanted to know if pt should increase her lasix, informed no, see the md and he will decide what will be best. Ask of pt has chest pain, short of breath, weakness, N&V, h/a or leg becomes hot to touch and very tender to go to ED, she is agreeable

## 2015-10-06 NOTE — Telephone Encounter (Signed)
Pt daughter needs to speak with a nurse regarding right leg swollen. Please call back.

## 2015-10-07 ENCOUNTER — Ambulatory Visit (INDEPENDENT_AMBULATORY_CARE_PROVIDER_SITE_OTHER): Payer: Commercial Managed Care - HMO | Admitting: Internal Medicine

## 2015-10-07 ENCOUNTER — Encounter: Payer: Self-pay | Admitting: Internal Medicine

## 2015-10-07 VITALS — BP 140/50 | HR 76 | Temp 98.2°F | Ht 60.0 in | Wt 121.7 lb

## 2015-10-07 DIAGNOSIS — R739 Hyperglycemia, unspecified: Secondary | ICD-10-CM | POA: Diagnosis not present

## 2015-10-07 DIAGNOSIS — I5032 Chronic diastolic (congestive) heart failure: Secondary | ICD-10-CM | POA: Diagnosis not present

## 2015-10-07 DIAGNOSIS — R7303 Prediabetes: Secondary | ICD-10-CM | POA: Diagnosis not present

## 2015-10-07 LAB — GLUCOSE, CAPILLARY: Glucose-Capillary: 69 mg/dL (ref 65–99)

## 2015-10-07 LAB — POCT GLYCOSYLATED HEMOGLOBIN (HGB A1C): HEMOGLOBIN A1C: 6.2

## 2015-10-07 NOTE — Assessment & Plan Note (Signed)
Patient has a h/o hyperglycemia.  A1c checked showing prediabetes range. - CTM

## 2015-10-07 NOTE — Assessment & Plan Note (Signed)
Patient reports 1 week h/o bilateral LE edema.  She is currently taking Lasix 20 mg qAM and 40 mg qPM.  She has not missed any of her Lasix doses or other medications.  She sleeps on 2 pillows, which is unchanged. She denies weight gain, CP, palpitations, fevers, or chills.  She endorses compliance with diet, avoiding salt. She does complain of feeling jittery at times, and being hot while staying cold.  Exam shows rate controlled afib with JVD to mandible at 90 degrees, but clear lungs.  Legs with 1+ edema to midshin.  Chart review demonstrates 7 lb weight gain since April.  A/P: HFrEF with worsening lower extremity edema.  No s/sx of heart failure exacerbation, as no evidence of infection, uncontrolled afib, diet or medication noncompliance, MI, or PE.  As she complains of feeling jittery, will check TSH to r/o hyperthyroid.  Most likely etiology of edema is simply underdosing for her increasing CKD.  Per chart review, her dry weight appears to somewhere between 114-120.  Will increase Lasix dose for a short period until weight decreased to 118 lb, then increase maintenance dose. [ ]  BMP [ ]  TSH - Lasix 60 mg PO BID until weight 118 or patient returns to clinic on Monday 6/26. If patient reaches 118 lbs, decrease to Lasix 40 mg PO BID. - RTC Monday 6/26

## 2015-10-07 NOTE — Progress Notes (Signed)
Patient ID: [name], female   DOB: 01-Dec-1925, 82 y.o.   MRN: [id number]   Subjective:   Patient ID: [name] female   DOB: Nov 09, 1925 82 y.o.   MRN: [id number]  HPI: [name] is a 82 y.o. female with PMH as below, here for eval LE swelling.  Please see Problem-Based charting for the status of the patient's chronic medical issues.     Past Medical History  Diagnosis Date  . Weight loss, unintentional 2010-2011    8/08-8/10 138-146 lbs, between 8/10-10/11 lost 18 lbs., work-up negative  . Incontinence overflow, stress female     on vesicare  . History of sick sinus syndrome s/p pacemaker Dr. Lovena Le 2000  . Atrial fibrillation (HCC)     on coumadin followed by Dr. Elie Confer  . History of vaginal bleeding 05/2005    nagative endometrial biopsy  . Restrictive lung disease 1996    PFT's showed mild disease  . Anemia     Iron deficiency, does not tolerate po  . CHF (congestive heart failure) (HCC)     diastolic dysfunction  . Hypertension     well controlled on 3 agents  . Osteoporosis   . Allergic rhinitis   . Onychomycosis   . History of trichomonal urethritis   . Constipation   . Diverticulosis of colon (without mention of hemorrhage)   . Pacemaker   . Type II diabetes mellitus (HCC)     diet controlled  . Stroke [facility]) 2002    R MCA, cardioembolic  . Arthritis     "all over my body"  . DJD (degenerative joint disease)   . GERD (gastroesophageal reflux disease)   . Adenomatous colon polyp     tubular   Current Outpatient Prescriptions  Medication Sig Dispense Refill  . aspirin EC 81 MG tablet Take 81 mg by mouth daily.    . cetirizine (ZYRTEC) 1 MG/ML syrup Take 5 mLs (5 mg total) by mouth daily. 118 mL 12  . dextromethorphan-guaiFENesin (MUCINEX DM) 30-600 MG 12hr tablet Take 1 tablet by mouth 2 (two) times daily. 30 tablet 0  . furosemide (LASIX) 20 MG tablet TAKE 2 TABLETS BY MOUTH TWICE DAILY 360 tablet 3  . lisinopril (PRINIVIL,ZESTRIL) 20  MG tablet TAKE 1 TABLET BY MOUTH DAILY 30 tablet 5  . metoprolol (LOPRESSOR) 100 MG tablet TAKE 1 TABLET(100 MG) BY MOUTH TWICE DAILY 60 tablet 6  . Nutritional Supplements ([place]) LIQD Take 237 mLs by mouth 3 (three) times daily. 90 Bottle 0  . potassium chloride 20 MEQ/15ML (10%) SOLN TAKE 15 MLS BY MOUTH DAILY 473 mL 0  . VESICARE 10 MG tablet TAKE 1 TABLET BY MOUTH EVERY DAY 90 tablet 0   No current facility-administered medications for this visit.   Family History  Problem Relation Age of Onset  . Diabetes Sister   . Heart disease     Social History   Social History  . Marital Status: Widowed    Spouse Name: N/A  . Number of Children: 8  . Years of Education: N/A   Occupational History  . retired    Social History Main Topics  . Smoking status: Never Smoker   . Smokeless tobacco: Never Used  . Alcohol Use: 0.0 oz/week    0 Standard drinks or equivalent per week     Comment: occasionally.  . Drug Use: No  . Sexual Activity: No   Other Topics Concern  . None  Social History Narrative   [employer]   [name] home# [phone]   Cell# [id number]      Patient lives alone, but has 7, of 8, living children who check in on her.   She is widowed.   Review of Systems: Pertinent items noted in Problem-Based charting. Objective:  Physical Exam: Filed Vitals:   10/07/15 0849  BP: 140/50  Pulse: 76  Temp: 98.2 F (36.8 C)  TempSrc: Oral  Height: 5' (1.524 m)  Weight: 121 lb 11.2 oz (55.203 kg)  SpO2: 100%   Physical Exam  Constitutional: She is oriented to person, place, and time and well-developed, well-nourished, and in no distress. No distress.  HENT:  Head: Normocephalic and atraumatic.  Eyes: EOM are normal. No scleral icterus.  Neck: No tracheal deviation present.  JVD present to angle of mandible at 90 degrees.  Cardiovascular: Normal rate.   Irregularly irregular rhythm. No murmurs appreciated. Rate controlled.  1+ DP pulse on  right. Unable to appreciate DP pulse on left. Bilateral feet warm to touch.  Pulmonary/Chest: Effort normal and breath sounds normal. No stridor. No respiratory distress. She has no wheezes. She has no rales.  No crackles appreciated.  Abdominal: Soft. She exhibits no distension. There is no tenderness.  Musculoskeletal:  1+ LE bilaterally to midshin.  No calf pain or tenderness.  Neurological: She is alert and oriented to person, place, and time.  Skin: Skin is warm and dry. She is not diaphoretic.     Assessment & Plan:   Patient and case were discussed with Dr. Lynnae January.  Please refer to Problem Based charting for further documentation.

## 2015-10-07 NOTE — Progress Notes (Signed)
Internal Medicine Clinic Attending  Case discussed with Dr. Taylor at the time of the visit.  We reviewed the resident's history and exam and pertinent patient test results.  I agree with the assessment, diagnosis, and plan of care documented in the resident's note. 

## 2015-10-07 NOTE — Patient Instructions (Signed)
1. Weigh yourself every morning after urinating. 2. Increase Lasix to 60 mg (3 tabs) TWICE daily until weight decreases to 118 lbs. 3. If you reach 118 lbs, decrease Lasix to 40 mg (2 tabs) TWICE daily. 4. Return to clinic on Monday.  Heart Failure Heart failure is a condition in which the heart has trouble pumping blood. This means your heart does not pump blood efficiently for your body to work well. In some cases of heart failure, fluid may back up into your lungs or you may have swelling (edema) in your lower legs. Heart failure is usually a long-term (chronic) condition. It is important for you to take good care of yourself and follow your health care provider's treatment plan. CAUSES  Some health conditions can cause heart failure. Those health conditions include:  High blood pressure (hypertension). Hypertension causes the heart muscle to work harder than normal. When pressure in the blood vessels is high, the heart needs to pump (contract) with more force in order to circulate blood throughout the body. High blood pressure eventually causes the heart to become stiff and weak.  Coronary artery disease (CAD). CAD is the buildup of cholesterol and fat (plaque) in the arteries of the heart. The blockage in the arteries deprives the heart muscle of oxygen and blood. This can cause chest pain and may lead to a heart attack. High blood pressure can also contribute to CAD.  Heart attack (myocardial infarction). A heart attack occurs when one or more arteries in the heart become blocked. The loss of oxygen damages the muscle tissue of the heart. When this happens, part of the heart muscle dies. The injured tissue does not contract as well and weakens the heart's ability to pump blood.  Abnormal heart valves. When the heart valves do not open and close properly, it can cause heart failure. This makes the heart muscle pump harder to keep the blood flowing.  Heart muscle disease (cardiomyopathy or  myocarditis). Heart muscle disease is damage to the heart muscle from a variety of causes. These can include drug or alcohol abuse, infections, or unknown reasons. These can increase the risk of heart failure.  Lung disease. Lung disease makes the heart work harder because the lungs do not work properly. This can cause a strain on the heart, leading it to fail.  Diabetes. Diabetes increases the risk of heart failure. High blood sugar contributes to high fat (lipid) levels in the blood. Diabetes can also cause slow damage to tiny blood vessels that carry important nutrients to the heart muscle. When the heart does not get enough oxygen and food, it can cause the heart to become weak and stiff. This leads to a heart that does not contract efficiently.  Other conditions can contribute to heart failure. These include abnormal heart rhythms, thyroid problems, and low blood counts (anemia). Certain unhealthy behaviors can increase the risk of heart failure, including:  Being overweight.  Smoking or chewing tobacco.  Eating foods high in fat and cholesterol.  Abusing illicit drugs or alcohol.  Lacking physical activity. SYMPTOMS  Heart failure symptoms may vary and can be hard to detect. Symptoms may include:  Shortness of breath with activity, such as climbing stairs.  Persistent cough.  Swelling of the feet, ankles, legs, or abdomen.  Unexplained weight gain.  Difficulty breathing when lying flat (orthopnea).  Waking from sleep because of the need to sit up and get more air.  Rapid heartbeat.  Fatigue and loss of energy.  Feeling  light-headed, dizzy, or close to fainting.  Loss of appetite.  Nausea.  Increased urination during the night (nocturia). DIAGNOSIS  A diagnosis of heart failure is based on your history, symptoms, physical examination, and diagnostic tests. Diagnostic tests for heart failure may include:  Echocardiography.  Electrocardiography.  Chest  X-ray.  Blood tests.  Exercise stress test.  Cardiac angiography.  Radionuclide scans. TREATMENT  Treatment is aimed at managing the symptoms of heart failure. Medicines, behavioral changes, or surgical intervention may be necessary to treat heart failure.  Medicines to help treat heart failure may include:  Angiotensin-converting enzyme (ACE) inhibitors. This type of medicine blocks the effects of a blood protein called angiotensin-converting enzyme. ACE inhibitors relax (dilate) the blood vessels and help lower blood pressure.  Angiotensin receptor blockers (ARBs). This type of medicine blocks the actions of a blood protein called angiotensin. Angiotensin receptor blockers dilate the blood vessels and help lower blood pressure.  Water pills (diuretics). Diuretics cause the kidneys to remove salt and water from the blood. The extra fluid is removed through urination. This loss of extra fluid lowers the volume of blood the heart pumps.  Beta blockers. These prevent the heart from beating too fast and improve heart muscle strength.  Digitalis. This increases the force of the heartbeat.  Healthy behavior changes include:  Obtaining and maintaining a healthy weight.  Stopping smoking or chewing tobacco.  Eating heart-healthy foods.  Limiting or avoiding alcohol.  Stopping illicit drug use.  Physical activity as directed by your health care provider.  Surgical treatment for heart failure may include:  A procedure to open blocked arteries, repair damaged heart valves, or remove damaged heart muscle tissue.  A pacemaker to improve heart muscle function and control certain abnormal heart rhythms.  An internal cardioverter defibrillator to treat certain serious abnormal heart rhythms.  A left ventricular assist device (LVAD) to assist the pumping ability of the heart. HOME CARE INSTRUCTIONS   Take medicines only as directed by your health care provider. Medicines are  important in reducing the workload of your heart, slowing the progression of heart failure, and improving your symptoms.  Do not stop taking your medicine unless directed by your health care provider.  Do not skip any dose of medicine.  Refill your prescriptions before you run out of medicine. Your medicines are needed every day.  Engage in moderate physical activity if directed by your health care provider. Moderate physical activity can benefit some people. The elderly and people with severe heart failure should consult with a health care provider for physical activity recommendations.  Eat heart-healthy foods. Food choices should be free of trans fat and low in saturated fat, cholesterol, and salt (sodium). Healthy choices include fresh or frozen fruits and vegetables, fish, lean meats, legumes, fat-free or low-fat dairy products, and whole grain or high fiber foods. Talk to a dietitian to learn more about heart-healthy foods.  Limit sodium if directed by your health care provider. Sodium restriction may reduce symptoms of heart failure in some people. Talk to a dietitian to learn more about heart-healthy seasonings.  Use healthy cooking methods. Healthy cooking methods include roasting, grilling, broiling, baking, poaching, steaming, or stir-frying. Talk to a dietitian to learn more about healthy cooking methods.  Limit fluids if directed by your health care provider. Fluid restriction may reduce symptoms of heart failure in some people.  Weigh yourself every day. Daily weights are important in the early recognition of excess fluid. You should weigh yourself every morning  after you urinate and before you eat breakfast. Wear the same amount of clothing each time you weigh yourself. Record your daily weight. Provide your health care provider with your weight record.  Monitor and record your blood pressure if directed by your health care provider.  Check your pulse if directed by your health  care provider.  Lose weight if directed by your health care provider. Weight loss may reduce symptoms of heart failure in some people.  Stop smoking or chewing tobacco. Nicotine makes your heart work harder by causing your blood vessels to constrict. Do not use nicotine gum or patches before talking to your health care provider.  Keep all follow-up visits as directed by your health care provider. This is important.  Limit alcohol intake to no more than 1 drink per day for nonpregnant women and 2 drinks per day for men. One drink equals 12 ounces of beer, 5 ounces of wine, or 1 ounces of hard liquor. Drinking more than that is harmful to your heart. Tell your health care provider if you drink alcohol several times a week. Talk with your health care provider about whether alcohol is safe for you. If your heart has already been damaged by alcohol or you have severe heart failure, drinking alcohol should be stopped completely.  Stop illicit drug use.  Stay up-to-date with immunizations. It is especially important to prevent respiratory infections through current pneumococcal and influenza immunizations.  Manage other health conditions such as hypertension, diabetes, thyroid disease, or abnormal heart rhythms as directed by your health care provider.  Learn to manage stress.  Plan rest periods when fatigued.  Learn strategies to manage high temperatures. If the weather is extremely hot:  Avoid vigorous physical activity.  Use air conditioning or fans or seek a cooler location.  Avoid caffeine and alcohol.  Wear loose-fitting, lightweight, and light-colored clothing.  Learn strategies to manage cold temperatures. If the weather is extremely cold:  Avoid vigorous physical activity.  Layer clothes.  Wear mittens or gloves, a hat, and a scarf when going outside.  Avoid alcohol.  Obtain ongoing education and support as needed.  Participate in or seek rehabilitation as needed to  maintain or improve independence and quality of life. SEEK MEDICAL CARE IF:   You have a rapid weight gain.  You have increasing shortness of breath that is unusual for you.  You are unable to participate in your usual physical activities.  You tire easily.  You cough more than normal, especially with physical activity.  You have any or more swelling in areas such as your hands, feet, ankles, or abdomen.  You are unable to sleep because it is hard to breathe.  You feel like your heart is beating fast (palpitations).  You become dizzy or light-headed upon standing up. SEEK IMMEDIATE MEDICAL CARE IF:   You have difficulty breathing.  There is a change in mental status such as decreased alertness or difficulty with concentration.  You have a pain or discomfort in your chest.  You have an episode of fainting (syncope). MAKE SURE YOU:   Understand these instructions.  Will watch your condition.  Will get help right away if you are not doing well or get worse.   This information is not intended to replace advice given to you by your health care provider. Make sure you discuss any questions you have with your health care provider.   Document Released: 04/04/2005 Document Revised: 08/19/2014 Document Reviewed: 05/04/2012 Elsevier Interactive Patient Education 2016  [employer].

## 2015-10-08 LAB — BMP8+ANION GAP
Anion Gap: 19 mmol/L — ABNORMAL HIGH (ref 10.0–18.0)
BUN / CREAT RATIO: 16 (ref 12–28)
BUN: 18 mg/dL (ref 8–27)
CO2: 23 mmol/L (ref 18–29)
Calcium: 9.6 mg/dL (ref 8.7–10.3)
Chloride: 102 mmol/L (ref 96–106)
Creatinine, Ser: 1.13 mg/dL — ABNORMAL HIGH (ref 0.57–1.00)
GFR calc Af Amer: 50 mL/min/{1.73_m2} — ABNORMAL LOW (ref >59)
GFR, EST NON AFRICAN AMERICAN: 44 mL/min/{1.73_m2} — AB (ref >59)
GLUCOSE: 123 mg/dL — AB (ref 65–99)
POTASSIUM: 3.9 mmol/L (ref 3.5–5.2)
SODIUM: 144 mmol/L (ref 134–144)

## 2015-10-08 LAB — TSH: TSH: 1.59 u[IU]/mL (ref 0.450–4.500)

## 2015-10-09 ENCOUNTER — Ambulatory Visit: Payer: Commercial Managed Care - HMO

## 2015-10-12 ENCOUNTER — Ambulatory Visit: Payer: Commercial Managed Care - HMO | Admitting: Internal Medicine

## 2015-10-12 ENCOUNTER — Encounter: Payer: Self-pay | Admitting: Internal Medicine

## 2015-10-12 ENCOUNTER — Ambulatory Visit (INDEPENDENT_AMBULATORY_CARE_PROVIDER_SITE_OTHER): Payer: Commercial Managed Care - HMO | Admitting: *Deleted

## 2015-10-12 DIAGNOSIS — I4891 Unspecified atrial fibrillation: Secondary | ICD-10-CM | POA: Diagnosis not present

## 2015-10-12 DIAGNOSIS — Z950 Presence of cardiac pacemaker: Secondary | ICD-10-CM

## 2015-10-13 ENCOUNTER — Encounter: Payer: Self-pay | Admitting: Internal Medicine

## 2015-10-13 ENCOUNTER — Ambulatory Visit: Payer: Commercial Managed Care - HMO | Admitting: Internal Medicine

## 2015-10-13 LAB — CUP PACEART INCLINIC DEVICE CHECK
Battery Impedance: 379 Ohm
Battery Remaining Longevity: 97 mo
Battery Voltage: 2.79 V
Implantable Lead Implant Date: 20001214
Implantable Lead Location: 753860
Implantable Lead Model: 5092
Lead Channel Pacing Threshold Pulse Width: 0.4 ms
Lead Channel Setting Pacing Amplitude: 2 V
Lead Channel Setting Pacing Pulse Width: 0.4 ms
Lead Channel Setting Sensing Sensitivity: 4 mV
MDC IDC MSMT LEADCHNL RA IMPEDANCE VALUE: 0 Ohm
MDC IDC MSMT LEADCHNL RV IMPEDANCE VALUE: 616 Ohm
MDC IDC MSMT LEADCHNL RV PACING THRESHOLD AMPLITUDE: 0.5 V
MDC IDC MSMT LEADCHNL RV PACING THRESHOLD AMPLITUDE: 0.5 V
MDC IDC MSMT LEADCHNL RV PACING THRESHOLD PULSEWIDTH: 0.4 ms
MDC IDC SESS DTM: 20170626190145
MDC IDC STAT BRADY RV PERCENT PACED: 54 %

## 2015-10-13 NOTE — Progress Notes (Signed)
Pacemaker check in clinic. Normal device function. Threshold, sensing, impedance consistent with previous measurements. Device programmed to maximize longevity. 2 high ventricular rates noted- markers only, longest 5 seconds, hx of AF. Device programmed at appropriate safety margins. Histogram distribution appropriate for patient activity level. Device programmed to optimize intrinsic conduction. Estimated longevity 6.5-9.5 years. ROV with GT in December.

## 2015-10-16 ENCOUNTER — Ambulatory Visit: Payer: Commercial Managed Care - HMO

## 2015-10-21 DIAGNOSIS — M6281 Muscle weakness (generalized): Secondary | ICD-10-CM | POA: Diagnosis not present

## 2015-10-21 DIAGNOSIS — I5032 Chronic diastolic (congestive) heart failure: Secondary | ICD-10-CM | POA: Diagnosis not present

## 2015-11-01 ENCOUNTER — Other Ambulatory Visit: Payer: Self-pay | Admitting: Internal Medicine

## 2015-12-02 ENCOUNTER — Ambulatory Visit: Payer: Commercial Managed Care - HMO | Admitting: Internal Medicine

## 2015-12-16 ENCOUNTER — Telehealth: Payer: Self-pay

## 2015-12-16 NOTE — Telephone Encounter (Signed)
Agree with appt. 

## 2015-12-16 NOTE — Telephone Encounter (Signed)
Pt daughter requesting to speak with a nurse regarding loose stool.

## 2015-12-16 NOTE — Telephone Encounter (Signed)
Talk to pt's daughter, [name] - states pt having diarrhea stools x 2 weeks; "on and off". Taking pepto bismol and anti-diarrheal pills but not helping. Drinking some liquids. States went for colonoscopy but was not done;suggest returning to see GI since this has been going on per [facility] (4/20 note) but states prefer coming here. No available appts tomorrow in [facility] ; but will see Dr Gay Filler.

## 2015-12-17 ENCOUNTER — Ambulatory Visit (INDEPENDENT_AMBULATORY_CARE_PROVIDER_SITE_OTHER): Payer: Commercial Managed Care - HMO | Admitting: Internal Medicine

## 2015-12-17 ENCOUNTER — Encounter: Payer: Self-pay | Admitting: Internal Medicine

## 2015-12-17 VITALS — BP 134/58 | HR 62 | Temp 98.2°F | Wt 125.3 lb

## 2015-12-17 DIAGNOSIS — R159 Full incontinence of feces: Secondary | ICD-10-CM | POA: Diagnosis not present

## 2015-12-17 DIAGNOSIS — I5032 Chronic diastolic (congestive) heart failure: Secondary | ICD-10-CM

## 2015-12-17 DIAGNOSIS — K6289 Other specified diseases of anus and rectum: Secondary | ICD-10-CM | POA: Diagnosis not present

## 2015-12-17 MED ORDER — FUROSEMIDE 20 MG PO TABS: ORAL_TABLET | ORAL | 3 refills | Status: DC

## 2015-12-17 NOTE — Assessment & Plan Note (Signed)
Patient reports a several month history of incontinence to stool. She was evaluated by Dr. Daryll Drown several months ago at which time she was determined to have anal sphincter weakness as well as perianal anesthesia. She had no other focal neurological deficits at that time. She was referred to GI, and further workup was evaluated for "diarrhea" and it was determined that she was high risk for colonoscopy given her comorbid conditions. He was recommended at that time that she consider altering her diet as well as taking her mind as needed for loose stool.  Since that time the patient has continued to report incontinence of stool. However the patient notes that the stools are well formed and not loose or watery. She reports that when she goes to the bathroom to urinate she will notice that she "has a mass in her pants." She denies feeling any urge for BM. She's been taking loperamide and Pepto-Bismol daily without any relief. She reports that her symptoms have not worsened over the intervening months but have remained consistent with 1-2 episodes daily.  On exam she has good lower extremity strength, and no saddle anesthesia. Rectal exam was deferred given a good exam reported by Dr. Daryll Drown several months ago without progression of symptoms.  We considered second referral to GI with specific request for defacography and manometry, however we feel that at this time patient would be a poor surgical candidate for anorectal procedure. We will defer GI referral at present. We will recommended chemotherapy exercises to strengthen peroneal muscles. We have also reinforced Dr. Doristine Section original recommendation for scheduled toileting, and this has been reinforced well with the patient. We have also recommended discontinuation of loperamide and Pepto-Bismol as these have not relieved her symptoms and are likely unhelpful in the setting of anorectal dysfunction without loose stool.  We considered spinal imaging, however  given the patient's age and comorbid conditions we would defer unless symptoms were to worsen. All these options were discussed with patient and plan was agreed upon. Patient should follow up with Dr. Daryll Drown in 4-6 weeks.

## 2015-12-17 NOTE — Assessment & Plan Note (Addendum)
On presentation today patient was noted have significant lower extremity swelling bilaterally with 2-3+ edema. Her lungs were clear without crackles. On further examination she was also noted to have gained 4 pounds since her last exam at the end of June. She reported that she has had some mild increase in shortness of breath with exertion as well as noting that her leg swelling is worse than usual. She is currently well compliant with her 40 mg twice a day Lasix. In the past she has been treated for volume overload by titrating her home dose of Lasix and has tolerated this well. I recommended that she increase her morning Lasix dose by 20 mg for 2 weeks and monitor her weights daily. She is agreeable to this plan. We will follow-up in several weeks to assess her weight and current fluid status by phone. Patient was also counseled on good fluid restriction and restriction of salt intake.

## 2015-12-17 NOTE — Progress Notes (Signed)
CC: Incontinence of stool HPI: [name] is a 82 y.o. female with a h/o of CHF, hypertension, CKD stage III who presents for evaluation of several month history of incontinence of stool.  Please see Problem-based charting for HPI and the status of patient's chronic medical conditions.  Past Medical History:  Diagnosis Date  . Adenomatous colon polyp    tubular  . Allergic rhinitis   . Anemia    Iron deficiency, does not tolerate po  . Arthritis    "all over my body"  . Atrial fibrillation (HCC)    on coumadin followed by Dr. Elie Duran  . CHF (congestive heart failure) (HCC)    diastolic dysfunction  . Constipation   . Diverticulosis of colon (without mention of hemorrhage)   . DJD (degenerative joint disease)   . GERD (gastroesophageal reflux disease)   . History of sick sinus syndrome s/p pacemaker Dr. Lovena Duran 2000  . History of trichomonal urethritis   . History of vaginal bleeding 05/2005   nagative endometrial biopsy  . Hypertension    well controlled on 3 agents  . Incontinence overflow, stress female    on vesicare  . Onychomycosis   . Osteoporosis   . Pacemaker   . Restrictive lung disease 1996   PFT's showed mild disease  . Stroke [facility]) 2002   R MCA, cardioembolic  . Type II diabetes mellitus (HCC)    diet controlled  . Weight loss, unintentional 2010-2011   8/08-8/10 138-146 lbs, between 8/10-10/11 lost 18 lbs., work-up negative   Review of Systems: ROS in HPI. Otherwise: Review of Systems  Constitutional: Negative for chills, fever and weight loss.  Respiratory: Positive for shortness of breath. Negative for cough.   Cardiovascular: Positive for leg swelling. Negative for chest pain.  Gastrointestinal: Negative for abdominal pain, blood in stool, constipation, diarrhea, heartburn, melena, nausea and vomiting.       Incontinence of stool  Genitourinary: Negative for dysuria, frequency and urgency.    Physical Exam: Vitals:   12/17/15 1533    BP: (!) 134/58  Pulse: 62  Temp: 98.2 F (36.8 C)  TempSrc: Oral  SpO2: 100%  Weight: 125 lb 4.8 oz (56.8 kg)   Physical Exam  Constitutional: She appears well-developed. She is cooperative. No distress.  Cardiovascular: Normal rate, regular rhythm, normal heart sounds and normal pulses.  Exam reveals no gallop.   No murmur heard. Pulmonary/Chest: Effort normal and breath sounds normal. No respiratory distress. She has no wheezes. She has no rhonchi. She has no rales. Breasts are symmetrical.  Abdominal: Soft. Bowel sounds are normal. There is no tenderness.  Musculoskeletal: She exhibits edema (2-3+ BLE).  Neurological: She has normal strength. She displays no atrophy. No sensory deficit (No Duran deficit or saddle anesthesia).    Assessment & Plan:  See encounters tab for problem based medical decision making. Patient seen with Dr. Evette Duran  Fecal incontinence due to anorectal disorder Patient reports a several month history of incontinence to stool. She was evaluated by Dr. Daryll Duran several months ago at which time she was determined to have anal sphincter weakness as well as perianal anesthesia. She had no other focal neurological deficits at that time. She was referred to GI, and further workup was evaluated for "diarrhea" and it was determined that she was high risk for colonoscopy given her comorbid conditions. He was recommended at that time that she consider altering her diet as well as taking her mind as needed for loose stool.  Since that time the patient has continued to report incontinence of stool. However the patient notes that the stools are well formed and not loose or watery. She reports that when she goes to the bathroom to urinate she will notice that she "has a mass in her pants." She denies feeling any urge for BM. She's been taking loperamide and Pepto-Bismol daily without any relief. She reports that her symptoms have not worsened over the intervening months but have  remained consistent with 1-2 episodes daily.  On exam she has good lower extremity strength, and no saddle anesthesia. Rectal exam was deferred given a good exam reported by Dr. Daryll Duran several months ago without progression of symptoms.  We considered second referral to GI with specific request for defacography and manometry, however we feel that at this time patient would be a poor surgical candidate for anorectal procedure. We will defer GI referral at present. We will recommended chemotherapy exercises to strengthen peroneal muscles. We have also reinforced Dr. Doristine Duran original recommendation for scheduled toileting, and this has been reinforced well with the patient. We have also recommended discontinuation of loperamide and Pepto-Bismol as these have not relieved her symptoms and are likely unhelpful in the setting of anorectal dysfunction without loose stool.  We considered spinal imaging, however given the patient's age and comorbid conditions we would defer unless symptoms were to worsen. All these options were discussed with patient and plan was agreed upon. Patient should follow up with Dr. Daryll Duran in 4-6 weeks.  Chronic diastolic heart failure ([place]) On presentation today patient was noted have significant lower extremity swelling bilaterally with 2-3+ edema. Her lungs were clear without crackles. On further examination she was also noted to have gained 4 pounds since her last exam at the end of June. She reported that she has had some mild increase in shortness of breath with exertion as well as noting that her leg swelling is worse than usual. She is currently well compliant with her 40 mg twice a day Lasix. In the past she has been treated for volume overload by titrating her home dose of Lasix and has tolerated this well. I recommended that she increase her morning Lasix dose by 20 mg for 2 weeks and monitor her weights daily. She is agreeable to this plan. We will follow-up in several weeks  to assess her weight and current fluid status by phone. Patient was also counseled on good fluid restriction and restriction of salt intake.  Signed: Holley Raring, MD 12/17/2015, 5:16 PM  Pager: [phone]

## 2015-12-17 NOTE — Patient Instructions (Addendum)
For your incontinence of stool, I would recommend returning to the GI doctor for evaluation of the muscle strength of your anus. In the meantime, it will be helpful for you to schedule your trips to the bathroom every few hours. Do not wait to on the urge to have a BM, but go on a regular schedule. You can also preform some exercises to strengthen your muscles.  I would also like for you to take an extra fluid pill in the morning. Do this for 2 weeks and weigh yourself everyday.   Kegel Exercises The goal of Kegel exercises is to isolate and exercise your pelvic floor muscles. These muscles act as a hammock that supports the rectum, vagina, small intestine, and uterus. As the muscles weaken, the hammock sags and these organs are displaced from their normal positions. Kegel exercises can strengthen your pelvic floor muscles and help you to improve bladder and bowel control, improve sexual response, and help reduce many problems and some discomfort during pregnancy. Kegel exercises can be done anywhere and at any time. HOW TO PERFORM KEGEL EXERCISES 1. Locate your pelvic floor muscles. To do this, squeeze (contract) the muscles that you use when you try to stop the flow of urine. You will feel a tightness in the vaginal area (women) and a tight lift in the rectal area (men and women). 2. When you begin, contract your pelvic muscles tight for 2-5 seconds, then relax them for 2-5 seconds. This is one set. Do 4-5 sets with a short pause in between. 3. Contract your pelvic muscles for 8-10 seconds, then relax them for 8-10 seconds. Do 4-5 sets. If you cannot contract your pelvic muscles for 8-10 seconds, try 5-7 seconds and work your way up to 8-10 seconds. Your goal is 4-5 sets of 10 contractions each day. Keep your stomach, buttocks, and legs relaxed during the exercises. Perform sets of both short and long contractions. Vary your positions. Perform these contractions 3-4 times per day. Perform sets while you  are:   Lying in bed in the morning.  Standing at lunch.  Sitting in the late afternoon.  Lying in bed at night. You should do 40-50 contractions per day. Do not perform more Kegel exercises per day than recommended. Overexercising can cause muscle fatigue. Continue these exercises for for at least 15-20 weeks or as directed by your caregiver.   This information is not intended to replace advice given to you by your health care provider. Make sure you discuss any questions you have with your health care provider.   Document Released: 03/21/2012 Document Revised: 04/25/2014 Document Reviewed: 03/21/2012 Elsevier Interactive Patient Education [employer].

## 2015-12-18 NOTE — Progress Notes (Signed)
Internal Medicine Clinic Attending  I saw and evaluated the patient.  I personally confirmed the key portions of the history and exam documented by Dr. Strelow and I reviewed pertinent patient test results.  The assessment, diagnosis, and plan were formulated together and I agree with the documentation in the resident's note. 

## 2015-12-27 ENCOUNTER — Emergency Department (HOSPITAL_COMMUNITY): Payer: Commercial Managed Care - HMO

## 2015-12-27 ENCOUNTER — Other Ambulatory Visit: Payer: Self-pay

## 2015-12-27 ENCOUNTER — Observation Stay (HOSPITAL_COMMUNITY)
Admission: EM | Admit: 2015-12-27 | Discharge: 2015-12-29 | Disposition: A | Discharge status: Home / Self Care | Payer: Commercial Managed Care - HMO | Attending: Student in an Organized Health Care Education/Training Program | Admitting: Student in an Organized Health Care Education/Training Program

## 2015-12-27 ENCOUNTER — Encounter (HOSPITAL_COMMUNITY): Payer: Self-pay | Admitting: Emergency Medicine

## 2015-12-27 DIAGNOSIS — E1122 Type 2 diabetes mellitus with diabetic chronic kidney disease: Secondary | ICD-10-CM | POA: Insufficient documentation

## 2015-12-27 DIAGNOSIS — R55 Syncope and collapse: Secondary | ICD-10-CM | POA: Diagnosis not present

## 2015-12-27 DIAGNOSIS — R9431 Abnormal electrocardiogram [ECG] [EKG]: Secondary | ICD-10-CM | POA: Diagnosis not present

## 2015-12-27 DIAGNOSIS — Z7982 Long term (current) use of aspirin: Secondary | ICD-10-CM | POA: Diagnosis not present

## 2015-12-27 DIAGNOSIS — N390 Urinary tract infection, site not specified: Secondary | ICD-10-CM | POA: Insufficient documentation

## 2015-12-27 DIAGNOSIS — Z79899 Other long term (current) drug therapy: Secondary | ICD-10-CM | POA: Insufficient documentation

## 2015-12-27 DIAGNOSIS — I495 Sick sinus syndrome: Secondary | ICD-10-CM | POA: Insufficient documentation

## 2015-12-27 DIAGNOSIS — W1830XA Fall on same level, unspecified, initial encounter: Secondary | ICD-10-CM | POA: Diagnosis not present

## 2015-12-27 DIAGNOSIS — Z6824 Body mass index (BMI) 24.0-24.9, adult: Secondary | ICD-10-CM | POA: Insufficient documentation

## 2015-12-27 DIAGNOSIS — I130 Hypertensive heart and chronic kidney disease with heart failure and stage 1 through stage 4 chronic kidney disease, or unspecified chronic kidney disease: Secondary | ICD-10-CM | POA: Insufficient documentation

## 2015-12-27 DIAGNOSIS — N183 Chronic kidney disease, stage 3 unspecified: Secondary | ICD-10-CM | POA: Diagnosis present

## 2015-12-27 DIAGNOSIS — E440 Moderate protein-calorie malnutrition: Secondary | ICD-10-CM | POA: Diagnosis not present

## 2015-12-27 DIAGNOSIS — I482 Chronic atrial fibrillation: Secondary | ICD-10-CM | POA: Insufficient documentation

## 2015-12-27 DIAGNOSIS — I5042 Chronic combined systolic (congestive) and diastolic (congestive) heart failure: Secondary | ICD-10-CM | POA: Diagnosis present

## 2015-12-27 DIAGNOSIS — S299XXA Unspecified injury of thorax, initial encounter: Secondary | ICD-10-CM | POA: Diagnosis not present

## 2015-12-27 DIAGNOSIS — I5032 Chronic diastolic (congestive) heart failure: Secondary | ICD-10-CM | POA: Insufficient documentation

## 2015-12-27 DIAGNOSIS — D696 Thrombocytopenia, unspecified: Secondary | ICD-10-CM | POA: Diagnosis not present

## 2015-12-27 DIAGNOSIS — I4891 Unspecified atrial fibrillation: Secondary | ICD-10-CM | POA: Diagnosis present

## 2015-12-27 DIAGNOSIS — S199XXA Unspecified injury of neck, initial encounter: Secondary | ICD-10-CM | POA: Diagnosis not present

## 2015-12-27 DIAGNOSIS — Z950 Presence of cardiac pacemaker: Secondary | ICD-10-CM | POA: Insufficient documentation

## 2015-12-27 DIAGNOSIS — Y92000 Kitchen of unspecified non-institutional (private) residence as  the place of occurrence of the external cause: Secondary | ICD-10-CM | POA: Insufficient documentation

## 2015-12-27 DIAGNOSIS — R079 Chest pain, unspecified: Secondary | ICD-10-CM | POA: Diagnosis not present

## 2015-12-27 DIAGNOSIS — N393 Stress incontinence (female) (male): Secondary | ICD-10-CM | POA: Diagnosis not present

## 2015-12-27 DIAGNOSIS — Z8673 Personal history of transient ischemic attack (TIA), and cerebral infarction without residual deficits: Secondary | ICD-10-CM | POA: Diagnosis not present

## 2015-12-27 DIAGNOSIS — Y93G1 Activity, food preparation and clean up: Secondary | ICD-10-CM | POA: Insufficient documentation

## 2015-12-27 DIAGNOSIS — S3993XA Unspecified injury of pelvis, initial encounter: Secondary | ICD-10-CM | POA: Diagnosis not present

## 2015-12-27 DIAGNOSIS — N39490 Overflow incontinence: Secondary | ICD-10-CM | POA: Diagnosis not present

## 2015-12-27 DIAGNOSIS — W19XXXA Unspecified fall, initial encounter: Secondary | ICD-10-CM

## 2015-12-27 DIAGNOSIS — I10 Essential (primary) hypertension: Secondary | ICD-10-CM | POA: Diagnosis present

## 2015-12-27 DIAGNOSIS — M542 Cervicalgia: Secondary | ICD-10-CM | POA: Diagnosis not present

## 2015-12-27 DIAGNOSIS — K219 Gastro-esophageal reflux disease without esophagitis: Secondary | ICD-10-CM | POA: Diagnosis present

## 2015-12-27 LAB — URINE MICROSCOPIC-ADD ON

## 2015-12-27 LAB — CBC WITH DIFFERENTIAL/PLATELET
Basophils Absolute: 0 10*3/uL (ref 0.0–0.1)
Basophils Relative: 0 %
Eosinophils Absolute: 0 10*3/uL (ref 0.0–0.7)
Eosinophils Relative: 1 %
HEMATOCRIT: 35.9 % — AB (ref 36.0–46.0)
HEMOGLOBIN: 12 g/dL (ref 12.0–15.0)
LYMPHS ABS: 0.9 10*3/uL (ref 0.7–4.0)
LYMPHS PCT: 12 %
MCH: 31.7 pg (ref 26.0–34.0)
MCHC: 33.4 g/dL (ref 30.0–36.0)
MCV: 94.7 fL (ref 78.0–100.0)
MONOS PCT: 7 %
Monocytes Absolute: 0.5 10*3/uL (ref 0.1–1.0)
NEUTROS ABS: 5.9 10*3/uL (ref 1.7–7.7)
NEUTROS PCT: 81 %
Platelets: 114 10*3/uL — ABNORMAL LOW (ref 150–400)
RBC: 3.79 MIL/uL — AB (ref 3.87–5.11)
RDW: 16.1 % — ABNORMAL HIGH (ref 11.5–15.5)
WBC: 7.3 10*3/uL (ref 4.0–10.5)

## 2015-12-27 LAB — URINALYSIS, ROUTINE W REFLEX MICROSCOPIC
Bilirubin Urine: NEGATIVE
GLUCOSE, UA: NEGATIVE mg/dL
HGB URINE DIPSTICK: NEGATIVE
Ketones, ur: NEGATIVE mg/dL
Nitrite: POSITIVE — AB
PH: 8 (ref 5.0–8.0)
Protein, ur: NEGATIVE mg/dL
SPECIFIC GRAVITY, URINE: 1.014 (ref 1.005–1.030)

## 2015-12-27 LAB — COMPREHENSIVE METABOLIC PANEL
ALK PHOS: 144 U/L — AB (ref 38–126)
ALT: 16 U/L (ref 14–54)
AST: 39 U/L (ref 15–41)
Albumin: 3.5 g/dL (ref 3.5–5.0)
Anion gap: 12 (ref 5–15)
BILIRUBIN TOTAL: 2.3 mg/dL — AB (ref 0.3–1.2)
BUN: 19 mg/dL (ref 6–20)
CALCIUM: 9.6 mg/dL (ref 8.9–10.3)
CO2: 22 mmol/L (ref 22–32)
CREATININE: 1.32 mg/dL — AB (ref 0.44–1.00)
Chloride: 104 mmol/L (ref 101–111)
GFR calc non Af Amer: 35 mL/min — ABNORMAL LOW (ref >60)
GFR, EST AFRICAN AMERICAN: 40 mL/min — AB (ref >60)
Glucose, Bld: 164 mg/dL — ABNORMAL HIGH (ref 65–99)
Potassium: 5 mmol/L (ref 3.5–5.1)
SODIUM: 138 mmol/L (ref 135–145)
Total Protein: 7.7 g/dL (ref 6.5–8.1)

## 2015-12-27 LAB — GLUCOSE, CAPILLARY: Glucose-Capillary: 129 mg/dL — ABNORMAL HIGH (ref 65–99)

## 2015-12-27 LAB — TROPONIN I: Troponin I: <0.03 ng/mL (ref <0.03)

## 2015-12-27 LAB — CBC
HCT: 32 % — ABNORMAL LOW (ref 36.0–46.0)
Hemoglobin: 10.5 g/dL — ABNORMAL LOW (ref 12.0–15.0)
MCH: 30.9 pg (ref 26.0–34.0)
MCHC: 32.8 g/dL (ref 30.0–36.0)
MCV: 94.1 fL (ref 78.0–100.0)
PLATELETS: 89 10*3/uL — AB (ref 150–400)
RBC: 3.4 MIL/uL — ABNORMAL LOW (ref 3.87–5.11)
RDW: 16.4 % — AB (ref 11.5–15.5)
WBC: 6.4 10*3/uL (ref 4.0–10.5)

## 2015-12-27 LAB — CREATININE, SERUM
CREATININE: 1.29 mg/dL — AB (ref 0.44–1.00)
GFR calc non Af Amer: 36 mL/min — ABNORMAL LOW (ref >60)
GFR, EST AFRICAN AMERICAN: 42 mL/min — AB (ref >60)

## 2015-12-27 LAB — I-STAT TROPONIN, ED: Troponin i, poc: 0.08 ng/mL (ref 0.00–0.08)

## 2015-12-27 MED ORDER — ACETAMINOPHEN 325 MG PO TABS
650.0000 mg | ORAL_TABLET | Freq: Once | ORAL | Status: AC
  Administered 2015-12-27: 650 mg via ORAL
  Filled 2015-12-27: qty 2

## 2015-12-27 MED ORDER — ACETAMINOPHEN 650 MG RE SUPP
650.0000 mg | Freq: Four times a day (QID) | RECTAL | Status: DC | PRN
Start: 2015-12-27 — End: 2015-12-29

## 2015-12-27 MED ORDER — ACETAMINOPHEN 325 MG PO TABS
650.0000 mg | ORAL_TABLET | Freq: Four times a day (QID) | ORAL | Status: DC | PRN
Start: 2015-12-27 — End: 2015-12-29
  Administered 2015-12-28: 650 mg via ORAL
  Filled 2015-12-27: qty 2

## 2015-12-27 MED ORDER — ASPIRIN EC 81 MG PO TBEC
81.0000 mg | DELAYED_RELEASE_TABLET | Freq: Every day | ORAL | Status: DC
  Administered 2015-12-28 – 2015-12-29 (×2): 81 mg via ORAL
  Filled 2015-12-27 (×2): qty 1

## 2015-12-27 MED ORDER — SODIUM CHLORIDE 0.9% FLUSH
3.0000 mL | Freq: Two times a day (BID) | INTRAVENOUS | Status: DC
  Administered 2015-12-27 – 2015-12-28 (×3): 3 mL via INTRAVENOUS

## 2015-12-27 MED ORDER — METOPROLOL TARTRATE 100 MG PO TABS
100.0000 mg | ORAL_TABLET | Freq: Two times a day (BID) | ORAL | Status: DC
  Administered 2015-12-27 – 2015-12-29 (×4): 100 mg via ORAL
  Filled 2015-12-27 (×4): qty 1

## 2015-12-27 MED ORDER — SODIUM CHLORIDE 0.9 % IV SOLN
INTRAVENOUS | Status: DC
  Administered 2015-12-27: 23:00:00 via INTRAVENOUS

## 2015-12-27 MED ORDER — SODIUM CHLORIDE 0.9 % IV BOLUS (SEPSIS)
500.0000 mL | Freq: Once | INTRAVENOUS | Status: AC
  Administered 2015-12-27: 500 mL via INTRAVENOUS

## 2015-12-27 MED ORDER — ENSURE ENLIVE PO LIQD
237.0000 mL | Freq: Three times a day (TID) | ORAL | Status: DC
  Administered 2015-12-27 – 2015-12-29 (×5): 237 mL via ORAL

## 2015-12-27 MED ORDER — ENOXAPARIN SODIUM 30 MG/0.3ML ~~LOC~~ SOLN
30.0000 mg | SUBCUTANEOUS | Status: DC
  Administered 2015-12-27: 30 mg via SUBCUTANEOUS
  Filled 2015-12-27: qty 0.3

## 2015-12-27 NOTE — ED Triage Notes (Signed)
Pt sts fall today and unsure about syncope or what caused fall; pt sts hit head on cabinet and has small hematoma noted

## 2015-12-27 NOTE — ED Notes (Signed)
Returned from x ray and ct  

## 2015-12-27 NOTE — H&P (Signed)
Date: 12/27/2015               Patient Name:  [name] MRN: [id number]  DOB: 06-02-1925 Age / Sex: 82 y.o., female   PCP: Sid Falcon, MD         Medical Service: Internal Medicine Teaching Service         Attending Physician: Dr. Axel Filler, MD    First Contact: Dr. Jethro Bolus Pager: [phone]  Second Contact: Dr. Martyn Malay Pager: [phone]       After Hours (After 5p/  First Contact Pager: [phone]  weekends / holidays): Second Contact Pager: [phone]   Chief Complaint: Syncope   History of Present Illness: Patient is an 82 yo F with a pmhx significant for HTN, GERD, chronic a-fib (ASA 81 daily), CVA (2000), DM, CKD stage 3, and HFrEF (30-35%) with pacemaker in place who presents after a syncopal episode from home. Patient says she was in her usual state of health until this afternoon when she was in the kitchen preparing food and fell backwards, hitting her head on the sink. Patient says she lost consciousness, but isn't sure for how long. She was found down by her daughter but was alert and oriented at that time. She denies any chest pain. She endorses occasional SOB with activity but denies any SOB during this episode. She denies feeling lightheaded or dizzy. No other prodromal symptoms. Patient says she felt fine and doesn't recall what happened, only remembers is waking up on the floor. She lives alone but has two daughters who live close by and check on her often. They help her manage her medication. Per daughter, she has not had any missed doses of her medicines. Her lasix was increased last Thursday from 40mg  BID to 60 mg in the morning and 40 mg in the evening for worsening lower extremity edema; no other recent medication changes. On ROS, her daughters endorse frequent episodes of urinary incontinence likely from her lasix, but patient denies any dysuria. No pain with urination or increased frequency. Last BM yesterday was normal. ROS is otherwise negative.    In the ED, patient is asymptomatic. VSS (T 98.2 F, BP 134/58, HR 62, RR 18, 100% on RA). CT head with no acute intracranial abnormalities and CT cervical spine with no evidence of fracture. CXR showed stable cardiomegaly and no active pulmonary disease. Pevlic xray was negative for acute fracture.  I-stat troponin was elevated at 0.08. CMP with elevated creatinine 1.32, otherwise unremarkable. CBC with a normal white count and hgb of 12, but low platelets with a count of 114. UA showed positive nitrites, moderate leukocytes, many bacteria, and triple phosphate crystals. EKG significant for a-fib and inverted T-waves in her inferior and lateral leads, more pronounced since prior tracing.  Meds:  Current Meds  Medication Sig  . aspirin EC 81 MG tablet Take 81 mg by mouth daily.  . furosemide (LASIX) 20 MG tablet Take 3 tabs (60mg ) every morning and 2 tabs (40mg ) every afternoon.  Marland Kitchen lisinopril (PRINIVIL,ZESTRIL) 20 MG tablet TAKE 1 TABLET BY MOUTH DAILY  . metoprolol (LOPRESSOR) 100 MG tablet TAKE 1 TABLET(100 MG) BY MOUTH TWICE DAILY  . Nutritional Supplements ([place]) LIQD Take 237 mLs by mouth 3 (three) times daily.  . potassium chloride 20 MEQ/15ML (10%) SOLN TAKE 15 MLS BY MOUTH DAILY  . VESICARE 10 MG tablet TAKE 1 TABLET BY MOUTH EVERY DAY    Allergies: Allergies as  of 12/27/2015  . (No Known Allergies)   Past Medical History:  Diagnosis Date  . Adenomatous colon polyp    tubular  . Allergic rhinitis   . Anemia    Iron deficiency, does not tolerate po  . Arthritis    "all over my body"  . Atrial fibrillation (HCC)    on coumadin followed by Dr. Elie Confer  . CHF (congestive heart failure) (HCC)    diastolic dysfunction  . Constipation   . Diverticulosis of colon (without mention of hemorrhage)   . DJD (degenerative joint disease)   . GERD (gastroesophageal reflux disease)   . History of sick sinus syndrome s/p pacemaker Dr. Lovena Le 2000  . History of  trichomonal urethritis   . History of vaginal bleeding 05/2005   nagative endometrial biopsy  . Hypertension    well controlled on 3 agents  . Incontinence overflow, stress female    on vesicare  . Onychomycosis   . Osteoporosis   . Pacemaker   . Restrictive lung disease 1996   PFT's showed mild disease  . Stroke [facility]) 2002   R MCA, cardioembolic  . Type II diabetes mellitus (HCC)    diet controlled  . Weight loss, unintentional 2010-2011   8/08-8/10 138-146 lbs, between 8/10-10/11 lost 18 lbs., work-up negative    Family History: DM in Mom and Dad. MI in Dad, and CVA in Mom.   Social History: Lives at home alone, but with family close by who check on her almost daily. Her daughters help manage her medication. She drinks one beer twice weekly. She has never smoked tobacco and denies illicit drug use.   Review of Systems: A complete ROS was negative except as per HPI.   Physical Exam: Blood pressure 152/77, pulse 88, temperature 99.8 F (37.7 C), temperature source Rectal, resp. rate 22, SpO2 95 %. Physical Exam Constitutional: NAD, appears comfortable HEENT: Atraumatic, normocephalic. PERRL, anicteric sclera.  Neck: Supple, trachea midline.  Cardiovascular: Irregular rhythm, regular rate. no murmurs, rubs, or gallops.  Pulmonary/Chest: CTAB, no wheezes, rales, or rhonchi. Pacemaker L chest.   Abdominal: Soft, non tender, non distended. +BS.  Extremities: Warm and well perfused. Distal pulses intact. 2+ pitting edema to the knees bilaterally Neurological: A&Ox3, CN II - XII grossly intact.  Skin: No rashes or erythema  Psychiatric: Normal mood and affect  EKG: Atrial fibrillation with ventricular rate of 84. T wave inversions in inferior and lateral leads, more pronounced from prior tracing.   CXR:  IMPRESSION: Stable prominent cardiomegaly without overt pulmonary edema. No active pulmonary disease.  Assessment & Plan by Problem: Patient is an 82 yo F with a pmhx of  HFrEF (30-35%) with pacemaker in place, chronic a-fib, and CKD stage 3 who presents after a syncopal episode from home.   Syncope: Patient denies chest pain, however elevated I-stat troponin (0.08) and EKG with inverted T-waves more pronounced from prior tracing. Coronary risk factors include DM (not currently on medication) and HTN. Patient also with recent increase in home lasix dose, orthostatic vitals positive in ED. Etiology likely hypovolemia vs. ACS.  -- Trend troponins q6hrs -- Tele monitoring  -- Hold evening lasix dose -- S/p 500 cc bolus in ED -- Gentle fluids NS 75 cc/hr for 8 hours (in the setting of HFrEF)  HFrEF: TTE in 02/2015 with EF 30-35%. Pacemaker in place, last interrogated on 10/12/15 with normal function. Lasix was recently increased last Thursday from 40 mg BID to 60 mg in AM and 40 mg  PM for worsening lower extremity edema.  -- ECHO pending  -- Continue metoprolol 100 mg BID -- Hold lasix for now   Thrombocytopenia: Platelet count of 114 on admission. Likely chronic as thrombocytopenia is listed under her problem list. No signs of active bleeding at this time. -- Continue to monitor   A-fib: Chronic, taken off warfarin a few years ago due to falls and bleeding risk. -- Continue ASA 81 daily   HTN: -- Hold home lisinopril 20 mg daily   CKD Stage 3: Creatinine at baseline (1.3) on admission -- Avoid nephrotoxic meds   FEN: NS 75 cc x 8 hours, replete lytes prn, heart healthy diet VTE ppx: Lovenox renal dosing  Code Status: FULL   Dispo: Admit patient to Observation with expected length of stay less than 2 midnights.  Signed: Velna Ochs, MD 12/27/2015, 8:41 PM  Pager: [phone]

## 2015-12-27 NOTE — ED Provider Notes (Signed)
[facility] DEPT Provider Note   CSN: [id number] Arrival date & time: 12/27/15  1304     History   Chief Complaint Chief Complaint  Patient presents with  . Fall    HPI [name] is a 82 y.o. female.  HPI This is an 82 year old female who was standing in her kitchen getting ready to prepare some food when she found herself on the ground. She had fallen backward and struck her head. She does not remember losing her balance or tripping. She is not sure how long she was on the ground unconscious. She was unable to get herself up from the ground and her daughter found her there. Her daughter have been gone approximately one hour. There is no evidence of loss of bladder control. Past Medical History:  Diagnosis Date  . Adenomatous colon polyp    tubular  . Allergic rhinitis   . Anemia    Iron deficiency, does not tolerate po  . Arthritis    "all over my body"  . Atrial fibrillation (HCC)    on coumadin followed by Dr. Elie Confer  . CHF (congestive heart failure) (HCC)    diastolic dysfunction  . Constipation   . Diverticulosis of colon (without mention of hemorrhage)   . DJD (degenerative joint disease)   . GERD (gastroesophageal reflux disease)   . History of sick sinus syndrome s/p pacemaker Dr. Lovena Le 2000  . History of trichomonal urethritis   . History of vaginal bleeding 05/2005   nagative endometrial biopsy  . Hypertension    well controlled on 3 agents  . Incontinence overflow, stress female    on vesicare  . Onychomycosis   . Osteoporosis   . Pacemaker   . Restrictive lung disease 1996   PFT's showed mild disease  . Stroke [facility]) 2002   R MCA, cardioembolic  . Type II diabetes mellitus (HCC)    diet controlled  . Weight loss, unintentional 2010-2011   8/08-8/10 138-146 lbs, between 8/10-10/11 lost 18 lbs., work-up negative    Patient Active Problem List   Diagnosis Date Noted  . Hyperglycemia 10/07/2015  . Productive cough 06/26/2015  . Fecal  incontinence due to anorectal disorder 11/06/2014  . Health care maintenance 02/27/2014  . Hypokalemia 01/23/2014  . Malnutrition of moderate degree ([place]) 01/22/2014  . Normocytic anemia 01/21/2014  . CKD (chronic kidney disease) stage 3, GFR 30-59 ml/min 01/21/2014  . Allergic rhinitis 10/16/2013  . Thrombocytopenia ([place]) 09/08/2013  . Unspecified protein-calorie malnutrition ([place]) 01/23/2013  . Decreased hearing 08/23/2012  . At high risk for falls 08/10/2012  . Age-related cognitive decline 08/10/2012  . GERD (gastroesophageal reflux disease) 06/15/2011  . Pacemaker-Medtronic 05/23/2011  . Chronic diastolic heart failure ([place])   . Hypertension   . Osteoporosis   . History of stroke   . Atrial fibrillation, controlled ([place])   . Incontinence overflow, stress female   . History of sick sinus syndrome   . B12 DEFICIENCY 11/05/2009    Past Surgical History:  Procedure Laterality Date  . ABDOMINAL HYSTERECTOMY    . CATARACT EXTRACTION    . CHOLECYSTECTOMY    . COLONOSCOPY    . ESOPHAGOGASTRODUODENOSCOPY    . INSERT / REPLACE / REMOVE PACEMAKER  2000   MDT Sigma pacemaker implanted by Dr Lovena Le;  Marland Kitchen PACEMAKER GENERATOR CHANGE  10/2012   AdaptaL 10/2012 by Dr Lovena Le  . PACEMAKER GENERATOR CHANGE N/A 11/02/2012   Procedure: PACEMAKER GENERATOR CHANGE;  Surgeon: Evans Lance, MD;  Location: [facility]  CATH LAB;  Service: Cardiovascular;  Laterality: N/A;    OB History    No data available       Home Medications    Prior to Admission medications   Medication Sig Start Date End Date Taking? Authorizing Provider  aspirin EC 81 MG tablet Take 81 mg by mouth daily.    Historical Provider, MD  cetirizine (ZYRTEC) 1 MG/ML syrup Take 5 mLs (5 mg total) by mouth daily. 03/04/15   Sid Falcon, MD  dextromethorphan-guaiFENesin [facility] DM) 30-600 MG 12hr tablet Take 1 tablet by mouth 2 (two) times daily. 06/26/15   Tasrif Ahmed, MD  furosemide (LASIX) 20 MG tablet Take 3 tabs (60mg ) every  morning and 2 tabs (40mg ) every afternoon. 12/17/15   Holley Raring, MD  lisinopril (PRINIVIL,ZESTRIL) 20 MG tablet TAKE 1 TABLET BY MOUTH DAILY 08/21/15   Sid Falcon, MD  metoprolol (LOPRESSOR) 100 MG tablet TAKE 1 TABLET(100 MG) BY MOUTH TWICE DAILY 04/24/15   Sid Falcon, MD  Nutritional Supplements ([place]) LIQD Take 237 mLs by mouth 3 (three) times daily. 12/19/14   Zada Finders, MD  potassium chloride 20 MEQ/15ML (10%) SOLN TAKE 15 MLS BY MOUTH DAILY 11/03/15   Sid Falcon, MD  VESICARE 10 MG tablet TAKE 1 TABLET BY MOUTH EVERY DAY 09/29/15   Sid Falcon, MD    Family History Family History  Problem Relation Age of Onset  . Diabetes Sister   . Heart disease      Social History Social History  Substance Use Topics  . Smoking status: Never Smoker  . Smokeless tobacco: Never Used  . Alcohol use 0.0 oz/week     Comment: occasionally.     Allergies   Review of patient's allergies indicates no known allergies.   Review of Systems Review of Systems  All other systems reviewed and are negative.    Physical Exam Updated Vital Signs BP 127/68   Pulse 77   Temp 99.8 F (37.7 C) (Rectal)   Resp 25   SpO2 100%   Physical Exam  Constitutional: She is oriented to person, place, and time. She appears well-developed and well-nourished.  HENT:  Head: Normocephalic.  Eyes: EOM are normal. Pupils are equal, round, and reactive to light.  Neck: Normal range of motion. Neck supple.  Cardiovascular: Normal rate and normal heart sounds.  An irregularly irregular rhythm present.  Pulmonary/Chest: Effort normal and breath sounds normal.  Pacemaker in place left chest  Abdominal: Soft. Bowel sounds are normal.  Musculoskeletal:  Full active range of motion bilateral shoulders without any evidence of deformity or point tenderness No tenderness over hips.  Neurological: She is alert and oriented to person, place, and time.  Skin: Skin is warm. Capillary  refill takes less than 2 seconds.  Nursing note and vitals reviewed.    ED Treatments / Results  Labs (all labs ordered are listed, but only abnormal results are displayed) Labs Reviewed  CBC WITH DIFFERENTIAL/PLATELET - Abnormal; Notable for the following:       Result Value   RBC 3.79 (*)    HCT 35.9 (*)    RDW 16.1 (*)    Platelets 114 (*)    All other components within normal limits  COMPREHENSIVE METABOLIC PANEL - Abnormal; Notable for the following:    Glucose, Bld 164 (*)    Creatinine, Ser 1.32 (*)    Alkaline Phosphatase 144 (*)    Total Bilirubin 2.3 (*)  GFR calc non Af Amer 35 (*)    GFR calc Af Amer 40 (*)    All other components within normal limits  URINALYSIS, ROUTINE W REFLEX MICROSCOPIC (NOT AT [facility])  OCCULT BLOOD X 1 CARD TO LAB, STOOL  I-STAT TROPOININ, ED    EKG  EKG Interpretation  Date/Time:  Sunday December 27 2015 15:34:07 EDT Ventricular Rate:  92 PR Interval:    QRS Duration: 106 QT Interval:  371 QTC Calculation: 406 R Axis:   17 Text Interpretation:  Atrial fibrillation Low voltage, precordial leads Repol abnrm, global ischemia, diffuse leads Confirmed by Devone Tousley MD, Andee Poles [phone]) on 12/27/2015 3:36:54 PM       Radiology Dg Chest 1 View  Result Date: 12/27/2015 CLINICAL DATA:  Fall.  Syncope.  Pain. EXAM: CHEST 1 VIEW COMPARISON:  06/26/2015 chest radiograph. FINDINGS: Stable configuration of single lead left subclavian pacemaker with lead tip overlying the right ventricular apex. Stable cardiomediastinal silhouette with prominent cardiomegaly and aortic atherosclerosis. No pneumothorax. No pleural effusion. No overt pulmonary edema. No acute consolidative airspace disease. No displaced fracture in the visualized chest. IMPRESSION: Stable prominent cardiomegaly without overt pulmonary edema. No active pulmonary disease. Electronically Signed   By: Ilona Sorrel M.D.   On: 12/27/2015 17:12   Dg Pelvis 1-2 Views  Result Date:  12/27/2015 CLINICAL DATA:  Fall with left hip pain.  Initial encounter. EXAM: PELVIS - 1-2 VIEW COMPARISON:  None. FINDINGS: Limited visualization of the sacrum due to overlapping stool and atherosclerotic calcification. Sensitivity is further degraded by rotation. No visible fracture or diastasis. Both hips are located. Osteopenia. IMPRESSION: No acute finding. Electronically Signed   By: Monte Fantasia M.D.   On: 12/27/2015 17:13   Ct Head Wo Contrast  Result Date: 12/27/2015 CLINICAL DATA:  fall syncope pain EXAM: CT HEAD WITHOUT CONTRAST CT CERVICAL SPINE WITHOUT CONTRAST TECHNIQUE: Multidetector CT imaging of the head and cervical spine was performed following the standard protocol without intravenous contrast. Multiplanar CT image reconstructions of the cervical spine were also generated. COMPARISON:  None. FINDINGS: CT HEAD FINDINGS Brain: There is patchy low attenuation throughout the subcortical and periventricular white matter compatible with chronic microvascular disease. There is encephalomalacia involving the right posterior frontal lobe compatible with previous cortical infarct. Prominence of the sulci and ventricles are identified compatible with brain atrophy. There is no abnormal extra-axial fluid collection, intracranial hemorrhage or mass identified. No evidence for acute brain infarct. Vascular: No hyperdense vessel or unexpected calcification. Skull: Normal. Negative for fracture or focal lesion. Sinuses/Orbits: No acute finding. Other: None CT CERVICAL SPINE FINDINGS Alignment: Normal. Skull base and vertebrae: No acute fracture. No primary bone lesion or focal pathologic process. Soft tissues and spinal canal: No prevertebral fluid or swelling. No visible canal hematoma. Disc levels: There is multi level degenerative disc disease identified throughout the cervical spine. This is most advanced at C2-3, C3-4 and C5-6. Upper chest: Negative. Other: None IMPRESSION: 1. No acute intracranial  abnormalities. Chronic microvascular disease and brain atrophy. There is an old right cerebral hemisphere infarct. 2. No evidence for cervical spine fracture. Multi level degenerative disc disease is identified. Electronically Signed   By: Kerby Moors M.D.   On: 12/27/2015 17:11   Ct Cervical Spine Wo Contrast  Result Date: 12/27/2015 CLINICAL DATA:  fall syncope pain EXAM: CT HEAD WITHOUT CONTRAST CT CERVICAL SPINE WITHOUT CONTRAST TECHNIQUE: Multidetector CT imaging of the head and cervical spine was performed following the standard protocol without intravenous contrast. Multiplanar CT  image reconstructions of the cervical spine were also generated. COMPARISON:  None. FINDINGS: CT HEAD FINDINGS Brain: There is patchy low attenuation throughout the subcortical and periventricular white matter compatible with chronic microvascular disease. There is encephalomalacia involving the right posterior frontal lobe compatible with previous cortical infarct. Prominence of the sulci and ventricles are identified compatible with brain atrophy. There is no abnormal extra-axial fluid collection, intracranial hemorrhage or mass identified. No evidence for acute brain infarct. Vascular: No hyperdense vessel or unexpected calcification. Skull: Normal. Negative for fracture or focal lesion. Sinuses/Orbits: No acute finding. Other: None CT CERVICAL SPINE FINDINGS Alignment: Normal. Skull base and vertebrae: No acute fracture. No primary bone lesion or focal pathologic process. Soft tissues and spinal canal: No prevertebral fluid or swelling. No visible canal hematoma. Disc levels: There is multi level degenerative disc disease identified throughout the cervical spine. This is most advanced at C2-3, C3-4 and C5-6. Upper chest: Negative. Other: None IMPRESSION: 1. No acute intracranial abnormalities. Chronic microvascular disease and brain atrophy. There is an old right cerebral hemisphere infarct. 2. No evidence for cervical  spine fracture. Multi level degenerative disc disease is identified. Electronically Signed   By: Kerby Moors M.D.   On: 12/27/2015 17:11    Procedures Procedures (including critical care time)  Medications Ordered in ED Medications  sodium chloride 0.9 % bolus 500 mL (not administered)     Initial Impression / Assessment and Plan / ED Course  I have reviewed the triage vital signs and the nursing notes.  Pertinent labs & imaging results that were available during my care of the patient were reviewed by me and considered in my medical decision making (see chart for details).  Clinical Course   82 year old female with syncopal episode today. She struck her head and had some neck and diffuse muscle aches afterwards. CT head and neck revealed no obvious signs of trauma. She has EKG with T-wave inversion in her inferior lateral leads more pronounced from prior. First troponin is borderline at 0.08. She does have a pacemaker in place. Given patient's symptoms, she will be admitted to trend troponins and evaluate for myocardial ischemia.  Final Clinical Impressions(s) / ED Diagnoses   Final diagnoses:  Fall, initial encounter  Syncope and collapse  Abnormal EKG    New Prescriptions New Prescriptions   No medications on file     Pattricia Boss, MD 12/27/15 1839

## 2015-12-27 NOTE — ED Notes (Signed)
Report on pacemaker - no new episodes noted

## 2015-12-28 ENCOUNTER — Telehealth: Payer: Self-pay | Admitting: Internal Medicine

## 2015-12-28 ENCOUNTER — Observation Stay (HOSPITAL_BASED_OUTPATIENT_CLINIC_OR_DEPARTMENT_OTHER): Payer: Commercial Managed Care - HMO

## 2015-12-28 DIAGNOSIS — D696 Thrombocytopenia, unspecified: Secondary | ICD-10-CM

## 2015-12-28 DIAGNOSIS — I5032 Chronic diastolic (congestive) heart failure: Secondary | ICD-10-CM | POA: Diagnosis not present

## 2015-12-28 DIAGNOSIS — I4891 Unspecified atrial fibrillation: Secondary | ICD-10-CM

## 2015-12-28 DIAGNOSIS — N183 Chronic kidney disease, stage 3 (moderate): Secondary | ICD-10-CM | POA: Diagnosis not present

## 2015-12-28 DIAGNOSIS — I5022 Chronic systolic (congestive) heart failure: Secondary | ICD-10-CM | POA: Diagnosis not present

## 2015-12-28 DIAGNOSIS — I130 Hypertensive heart and chronic kidney disease with heart failure and stage 1 through stage 4 chronic kidney disease, or unspecified chronic kidney disease: Secondary | ICD-10-CM | POA: Diagnosis not present

## 2015-12-28 DIAGNOSIS — E1122 Type 2 diabetes mellitus with diabetic chronic kidney disease: Secondary | ICD-10-CM | POA: Diagnosis not present

## 2015-12-28 DIAGNOSIS — R55 Syncope and collapse: Secondary | ICD-10-CM | POA: Diagnosis not present

## 2015-12-28 DIAGNOSIS — N390 Urinary tract infection, site not specified: Secondary | ICD-10-CM | POA: Diagnosis not present

## 2015-12-28 DIAGNOSIS — Z950 Presence of cardiac pacemaker: Secondary | ICD-10-CM

## 2015-12-28 DIAGNOSIS — I495 Sick sinus syndrome: Secondary | ICD-10-CM | POA: Diagnosis not present

## 2015-12-28 DIAGNOSIS — I482 Chronic atrial fibrillation: Secondary | ICD-10-CM | POA: Diagnosis not present

## 2015-12-28 DIAGNOSIS — Z7982 Long term (current) use of aspirin: Secondary | ICD-10-CM

## 2015-12-28 LAB — ECHOCARDIOGRAM COMPLETE: WEIGHTICAEL: 1974.4 [oz_av]

## 2015-12-28 LAB — BASIC METABOLIC PANEL
Anion gap: 6 (ref 5–15)
BUN: 17 mg/dL (ref 6–20)
CALCIUM: 9.2 mg/dL (ref 8.9–10.3)
CO2: 29 mmol/L (ref 22–32)
CREATININE: 1.22 mg/dL — AB (ref 0.44–1.00)
Chloride: 106 mmol/L (ref 101–111)
GFR, EST AFRICAN AMERICAN: 44 mL/min — AB (ref >60)
GFR, EST NON AFRICAN AMERICAN: 38 mL/min — AB (ref >60)
Glucose, Bld: 124 mg/dL — ABNORMAL HIGH (ref 65–99)
Potassium: 4.3 mmol/L (ref 3.5–5.1)
SODIUM: 141 mmol/L (ref 135–145)

## 2015-12-28 LAB — CBC
HCT: 31.4 % — ABNORMAL LOW (ref 36.0–46.0)
Hemoglobin: 10.1 g/dL — ABNORMAL LOW (ref 12.0–15.0)
MCH: 30.2 pg (ref 26.0–34.0)
MCHC: 32.2 g/dL (ref 30.0–36.0)
MCV: 94 fL (ref 78.0–100.0)
PLATELETS: 92 10*3/uL — AB (ref 150–400)
RBC: 3.34 MIL/uL — AB (ref 3.87–5.11)
RDW: 16.1 % — AB (ref 11.5–15.5)
WBC: 5.2 10*3/uL (ref 4.0–10.5)

## 2015-12-28 LAB — PROTIME-INR
INR: 1.48
PROTHROMBIN TIME: 18.1 s — AB (ref 11.4–15.2)

## 2015-12-28 LAB — APTT: aPTT: 36 seconds (ref 24–36)

## 2015-12-28 LAB — TROPONIN I

## 2015-12-28 LAB — GLUCOSE, CAPILLARY: Glucose-Capillary: 106 mg/dL — ABNORMAL HIGH (ref 65–99)

## 2015-12-28 MED ORDER — FUROSEMIDE 20 MG PO TABS
60.0000 mg | ORAL_TABLET | Freq: Every day | ORAL | Status: DC
  Administered 2015-12-28 – 2015-12-29 (×2): 60 mg via ORAL
  Filled 2015-12-28 (×2): qty 3

## 2015-12-28 MED ORDER — LISINOPRIL 20 MG PO TABS
20.0000 mg | ORAL_TABLET | Freq: Every day | ORAL | Status: DC
  Administered 2015-12-28 – 2015-12-29 (×2): 20 mg via ORAL
  Filled 2015-12-28 (×2): qty 1

## 2015-12-28 MED ORDER — FUROSEMIDE 40 MG PO TABS
40.0000 mg | ORAL_TABLET | Freq: Every day | ORAL | Status: DC
  Administered 2015-12-28: 40 mg via ORAL
  Filled 2015-12-28: qty 1

## 2015-12-28 MED ORDER — DEXTROSE 5 % IV SOLN
1.0000 g | INTRAVENOUS | Status: DC
  Administered 2015-12-28 – 2015-12-29 (×2): 1 g via INTRAVENOUS
  Filled 2015-12-28 (×3): qty 10

## 2015-12-28 NOTE — Progress Notes (Signed)
Subjective: Patient was seen and examined this morning. She states she feels well this morning. No recurrent syncopal episodes. She denies any lightheadedness, shortness of breath or chest pain. Her syncopal episode yesterday occurred after she was standing for about 10 minutes. There was no prodromal symptoms.   Objective: Vital signs in last 24 hours: Vitals:   12/27/15 2041 12/27/15 2335 12/27/15 2355 12/28/15 0352  BP: 131/82 130/75  (!) 104/58  Pulse: 88 91  64  Resp: 20   16  Temp: 99.9 F (37.7 C)  98.5 F (36.9 C) 98.7 F (37.1 C)  TempSrc: Oral  Oral Oral  SpO2: 95%  97% 96%  Weight: 122 lb 12.8 oz (55.7 kg)   123 lb 6.4 oz (56 kg)   Physical Exam General: Vital signs reviewed.  Patient is elderly, in no acute distress and cooperative with exam.  Neck: Supple, trachea midline, +JVD.  Cardiovascular: Irregularly irregular, 2/6 systolic murmur. No RV heave Pulmonary/Chest: Clear to auscultation bilaterally, no wheezes, rales, or rhonchi. Abdominal: Soft, non-tender, non-distended, BS + Musculoskeletal: +tenderness over left shoulder, able to hold up left arm against gravity Extremities: 1+ lower extremity edema bilaterally  Assessment/Plan: Principal Problem:   Syncope Active Problems:   Chronic diastolic heart failure (HCC)   Hypertension   Atrial fibrillation, controlled (HCC)   Pacemaker-Medtronic   GERD (gastroesophageal reflux disease)   CKD (chronic kidney disease) stage 3, GFR 30-59 ml/min  Patient is an 82 yo F with a pmhx of HFrEF (30-35%) with pacemaker in place, chronic a-fib, and CKD stage 3 who presents after a syncopal episode from home.   Syncope: Occurred without prodromal symptoms. Orthostatic vital signs negative. There is concern for arrhythmia despite pacemaker given the sudden episode of syncope and the change in morphology of QRS on EKG. Report on pacemaker interrogation from the ED shows no new episode; however, given her EKG finding and  presentation we have asked EP to evaluate our patient. Patient follows with Dr. Lovena Le. Other possible etiologies include infection from UTI. -EP consulted, appreciate recommendations -Repeat Echo pending  -Telemetry -Repeat EKG pending  UTI: UA on admission whows many bacteria, moderate leukocytes, positive nitrites, and 6-30 WBC with few squamous cell concerning for a UTI. Patient denies dysuria but admits to increased frequency which she attributes to her lasix. Afebrile, no leukocytosis. Will treat as UTI may have contributed to her syncopal episode.  -Ceftriaxone -UCx pending -Transition to oral medications tomorrow or today on discharge  Chronic HFrEF: TTE in 02/2015 with EF 30-35%. Pacemaker in place, last interrogated on 10/12/15 with normal function. Patient is on Lasix, Metoprolol and Lisinopril at home. Lasix was initially held due to concern for orthostatic hypotension, but orthostatic vital signs have been negative. Evidence of mild volume overload on exam with JVD and lower extremity edema.  -Repeat echo pending  -Continue metoprolol 100 mg BID -Restart Lasix 60 mg QAM and 40 mg QPM -Restart Lisinopril 20 mg daily  Thrombocytopenia: Chronic, but lower than baseline in the 80-90 range. Will hold lovenox for now and start SCDs. No signs of active bleeding at this time. -Repeat as oupatient  Chronic Atrial Fibrillation: Patient is on ASA 81 mg daily at home, rate controlled. -Continue ASA 81 daily   HTN: Normotensive. -Holding home lisinopril 20 mg daily   CKD Stage 3: Creatinine at baseline. -Avoid nephrotoxic meds   FEN: HH DVT/PE ppx: SCDs Code Status: FULL   Dispo: Anticipated discharge in approximately 0 day(s).   LOS: 0  days   Martyn Malay, DO PGY-3 Internal Medicine Resident Pager # [phone] 12/28/2015 10:16 AM

## 2015-12-28 NOTE — Consult Note (Signed)
ELECTROPHYSIOLOGY CONSULT NOTE    Patient ID: [name] MRN: [id number], DOB/AGE: 1925-06-28 82 y.o.  Admit date: 12/27/2015 Date of Consult: 12/28/2015  Primary Physician: Gilles Chiquito, MD Primary Cardiologist: Dr. Lovena Le Requesting MD: Dr. Evette Doffing  Reason for Consultation: syncope  HPI: [name] is a 82 y.o. female with PMHx of chronic diastolic CHF, Permanent AFib, HTN, DM, arthritis, and PPM cane to [facility] yesterday after a syncopal event.  She t ells me she was at the sink getting ready to make some breakfast, turned to go to the stove and fell, she is not certain of fainting, her daughter came from the store to find her on the floor, unsure of what happened or how she fell.  She did strike the back of her head, she is not sure on what.  She has been feeling pretty well, last week had some increased LE edema and SOB and her lasix increased from 2 pills BID to 3 in the AM and 2 at night.  She denies any kind of CP or palpitations.  She reports falls before but no hx of fainting.  She feels well now and since here without any complaints.  LABS: BUN/Creat 19/1.32 >> 17/1.22 K+ 5.0 H/H 12/35 WBC 7.3 plts 114 Trop <0.03 x2   Device information: MDT single lead PPM, implanted 11/02/12, Dr. Lovena Le for SSSx Interrogation today shows normal function, battery and lead status is OK, V pacing 70%, no events or observations noted  Past Medical History:  Diagnosis Date  . Adenomatous colon polyp    tubular  . Allergic rhinitis   . Anemia    Iron deficiency, does not tolerate po  . Arthritis    "all over my body"  . Atrial fibrillation (HCC)    on coumadin followed by Dr. Elie Confer  . CHF (congestive heart failure) (HCC)    diastolic dysfunction  . Constipation   . Diverticulosis of colon (without mention of hemorrhage)   . DJD (degenerative joint disease)   . GERD (gastroesophageal reflux disease)   . History of sick sinus syndrome s/p pacemaker Dr. Lovena Le 2000  .  History of trichomonal urethritis   . History of vaginal bleeding 05/2005   nagative endometrial biopsy  . Hypertension    well controlled on 3 agents  . Incontinence overflow, stress female    on vesicare  . Onychomycosis   . Osteoporosis   . Pacemaker   . Restrictive lung disease 1996   PFT's showed mild disease  . Stroke [facility]) 2002   R MCA, cardioembolic  . Type II diabetes mellitus (HCC)    diet controlled  . Weight loss, unintentional 2010-2011   8/08-8/10 138-146 lbs, between 8/10-10/11 lost 18 lbs., work-up negative     Surgical History:  Past Surgical History:  Procedure Laterality Date  . ABDOMINAL HYSTERECTOMY    . CATARACT EXTRACTION    . CHOLECYSTECTOMY    . COLONOSCOPY    . ESOPHAGOGASTRODUODENOSCOPY    . INSERT / REPLACE / REMOVE PACEMAKER  2000   MDT Sigma pacemaker implanted by Dr Lovena Le;  Marland Kitchen PACEMAKER GENERATOR CHANGE  10/2012   AdaptaL 10/2012 by Dr Lovena Le  . PACEMAKER GENERATOR CHANGE N/A 11/02/2012   Procedure: PACEMAKER GENERATOR CHANGE;  Surgeon: Evans Lance, MD;  Location: [facility] CATH LAB;  Service: Cardiovascular;  Laterality: N/A;     Prescriptions Prior to Admission  Medication Sig Dispense Refill Last Dose  . aspirin EC 81 MG tablet Take 81 mg by mouth  daily.   12/27/2015 at Unknown time  . furosemide (LASIX) 20 MG tablet Take 3 tabs (60mg ) every morning and 2 tabs (40mg ) every afternoon. 360 tablet 3 12/27/2015 at Unknown time  . lisinopril (PRINIVIL,ZESTRIL) 20 MG tablet TAKE 1 TABLET BY MOUTH DAILY 30 tablet 5 12/27/2015 at Unknown time  . metoprolol (LOPRESSOR) 100 MG tablet TAKE 1 TABLET(100 MG) BY MOUTH TWICE DAILY 60 tablet 6 12/27/2015 at 0800  . Nutritional Supplements ([place]) LIQD Take 237 mLs by mouth 3 (three) times daily. 90 Bottle 0 Past Week at Unknown time  . potassium chloride 20 MEQ/15ML (10%) SOLN TAKE 15 MLS BY MOUTH DAILY 473 mL 3 12/27/2015 at Unknown time  . VESICARE 10 MG tablet TAKE 1 TABLET BY MOUTH EVERY DAY  90 tablet 0 12/27/2015 at Unknown time  . cetirizine (ZYRTEC) 1 MG/ML syrup Take 5 mLs (5 mg total) by mouth daily. (Patient not taking: Reported on 12/27/2015) 118 mL 12 Not Taking at Unknown time  . dextromethorphan-guaiFENesin (MUCINEX DM) 30-600 MG 12hr tablet Take 1 tablet by mouth 2 (two) times daily. (Patient not taking: Reported on 12/27/2015) 30 tablet 0 Not Taking at Unknown time    Inpatient Medications:  . aspirin EC  81 mg Oral Daily  . cefTRIAXone (ROCEPHIN)  IV  1 g Intravenous Q24H  . feeding supplement (ENSURE ENLIVE)  237 mL Oral TID  . furosemide  40 mg Oral q1800  . furosemide  60 mg Oral Daily  . lisinopril  20 mg Oral Daily  . metoprolol  100 mg Oral BID  . sodium chloride flush  3 mL Intravenous Q12H    Allergies: No Known Allergies  Social History   Social History  . Marital status: Widowed    Spouse name: N/A  . Number of children: 8  . Years of education: N/A   Occupational History  . retired Retired   Social History Main Topics  . Smoking status: Never Smoker  . Smokeless tobacco: Never Used  . Alcohol use 0.0 oz/week     Comment: occasionally.  . Drug use: No  . Sexual activity: No   Other Topics Concern  . Not on file   Social History Narrative   PPM-Medtronic   [name] home# [id number]   Cell# [phone]      Patient lives alone, but has 7, of 8, living children who check in on her.   She is widowed.     Family History  Problem Relation Age of Onset  . Diabetes Sister   . Stroke Sister   . Heart disease    . Heart disease Father      Review of Systems: All other systems reviewed and are otherwise negative except as noted above.  Physical Exam: Vitals:   12/27/15 2355 12/28/15 0352 12/28/15 1255 12/28/15 1258  BP:  (!) 104/58 (!) 94/41 (!) 110/44  Pulse:  64 73   Resp:  16 16   Temp: 98.5 F (36.9 C) 98.7 F (37.1 C) 97.9 F (36.6 C)   TempSrc: Oral Oral Oral   SpO2: 97% 96% 100%   Weight:  123 lb 6.4 oz (56 kg)       GEN- The patient is well appearing, alert and oriented x 3 today.   HEENT: normocephalic, atraumatic; sclera clear, conjunctiva pink; hearing intact; oropharynx clear; neck supple, no JVP Lymph- no cervical lymphadenopathy Lungs- Clear to ausculation bilaterally, normal work of breathing.  No wheezes, rales, rhonchi Heart- IRRR, soft SM,  no rubs or gallops, PMI not laterally displaced GI- soft, non-tender, non-distended, bowel sounds present Extremities- no clubbing, cyanosis, 1+ edema MS- no significant deformity, age appropriate atrophy Skin- warm and dry, no rash or lesion Psych- euthymic mood, full affect Neuro- no gross deficits observed  Labs:   Lab Results  Component Value Date   WBC 5.2 12/28/2015   HGB 10.1 (L) 12/28/2015   HCT 31.4 (L) 12/28/2015   MCV 94.0 12/28/2015   PLT 92 (L) 12/28/2015    Recent Labs Lab 12/27/15 1600  12/28/15 0338  NA 138  --  141  K 5.0  --  4.3  CL 104  --  106  CO2 22  --  29  BUN 19  --  17  CREATININE 1.32*  < > 1.22*  CALCIUM 9.6  --  9.2  PROT 7.7  --   --   BILITOT 2.3*  --   --   ALKPHOS 144*  --   --   ALT 16  --   --   AST 39  --   --   GLUCOSE 164*  --  124*  < > = values in this interval not displayed.    Radiology/Studies:  Dg Chest 1 View Result Date: 12/27/2015 CLINICAL DATA:  Fall.  Syncope.  Pain. EXAM: CHEST 1 VIEW COMPARISON:  06/26/2015 chest radiograph. FINDINGS: Stable configuration of single lead left subclavian pacemaker with lead tip overlying the right ventricular apex. Stable cardiomediastinal silhouette with prominent cardiomegaly and aortic atherosclerosis. No pneumothorax. No pleural effusion. No overt pulmonary edema. No acute consolidative airspace disease. No displaced fracture in the visualized chest. IMPRESSION: Stable prominent cardiomegaly without overt pulmonary edema. No active pulmonary disease. Electronically Signed   By: Ilona Sorrel M.D.   On: 12/27/2015 17:12   Dg Pelvis 1-2  Views Result Date: 12/27/2015 CLINICAL DATA:  Fall with left hip pain.  Initial encounter. EXAM: PELVIS - 1-2 VIEW COMPARISON:  None. FINDINGS: Limited visualization of the sacrum due to overlapping stool and atherosclerotic calcification. Sensitivity is further degraded by rotation. No visible fracture or diastasis. Both hips are located. Osteopenia. IMPRESSION: No acute finding. Electronically Signed   By: Monte Fantasia M.D.   On: 12/27/2015 17:13   Ct Head Wo Contrast Result Date: 12/27/2015 CLINICAL DATA:  fall syncope pain EXAM: CT HEAD WITHOUT CONTRAST CT CERVICAL SPINE WITHOUT CONTRAST TECHNIQUE: Multidetector CT imaging of the head and cervical spine was performed following the standard protocol without intravenous contrast. Multiplanar CT image reconstructions of the cervical spine were also generated. COMPARISON:  None. FINDINGS: CT HEAD FINDINGS Brain: There is patchy low attenuation throughout the subcortical and periventricular white matter compatible with chronic microvascular disease. There is encephalomalacia involving the right posterior frontal lobe compatible with previous cortical infarct. Prominence of the sulci and ventricles are identified compatible with brain atrophy. There is no abnormal extra-axial fluid collection, intracranial hemorrhage or mass identified. No evidence for acute brain infarct. Vascular: No hyperdense vessel or unexpected calcification. Skull: Normal. Negative for fracture or focal lesion. Sinuses/Orbits: No acute finding. Other: None CT CERVICAL SPINE FINDINGS Alignment: Normal. Skull base and vertebrae: No acute fracture. No primary bone lesion or focal pathologic process. Soft tissues and spinal canal: No prevertebral fluid or swelling. No visible canal hematoma. Disc levels: There is multi level degenerative disc disease identified throughout the cervical spine. This is most advanced at C2-3, C3-4 and C5-6. Upper chest: Negative. Other: None IMPRESSION: 1. No  acute intracranial abnormalities. Chronic  microvascular disease and brain atrophy. There is an old right cerebral hemisphere infarct. 2. No evidence for cervical spine fracture. Multi level degenerative disc disease is identified. Electronically Signed   By: Kerby Moors M.D.   On: 12/27/2015 17:11      EKG: Afib 92bpm, PVC, T changes appear old AFib 71bpm, some paced beats  TELEMETRY: AFib, rate appears controlled, 70-110, intermittent pacing TEE this admission is pending   03/15/15 TTE Study Conclusions - Left ventricle: The cavity size was normal. Wall thickness was   normal. Systolic function was moderately to severely reduced. The   estimated ejection fraction was in the range of 30% to 35%.   Regional wall motion abnormalities cannot be excluded. - Aortic valve: There was mild to moderate regurgitation. - Mitral valve: Prolapse cannot be excluded. There was moderate   regurgitation. - Left atrium: The atrium was moderately dilated. - Right atrium: The atrium was moderately dilated. - Tricuspid valve: There was severe regurgitation. - Pulmonary arteries: PA peak pressure: 82 mm Hg (S). - Pericardium, extracardiac: A trivial pericardial effusion was   identified.  Assessment and Plan:   1. Syncope      Pacemaker is functioning well      No clear warning or prodrome      No reports of orthostatic symptoms of late or historically      No clear cardiac component pending her echo findngs, (last EF 30-35%, no HVR were noted by the device)      Would continue syncope work-up with medicine service        2. Permanent AFib     CHA2DS2Vasc is at least 5     Apparently taken off a/c some years ago secondary to falls/trauma  3. HTN     stable    Signed, Tommye Standard, PA-C 12/28/2015 1:25 PM       I have seen and examined this patient with Tommye Standard.  Agree with above, note added to reflect my findings.  On exam, regular rhythm, no murmurs, lungs clear. Had a fall  with possible syncope although the patient states that she did not lose conscoiusness.  Has pacemaker in place for her atrial fibrillation.  Pacemaker interrogated without evidence of tachycardia or bradycardia.  Would plan to continue workup for syncope at this time.  Please do not hesitate to call EP if further issues.    Fredrica Capano M. Allea Kassner MD 12/28/2015 7:57 PM

## 2015-12-28 NOTE — Progress Notes (Signed)
  Echocardiogram 2D Echocardiogram has been performed.  Donata Clay 12/28/2015, 11:47 AM

## 2015-12-28 NOTE — Progress Notes (Addendum)
Pt. Arrived to the unit from ED via stretcher in alert and stable condition. VSS. No s/s of distress noted. Pt. Denies pain at this time. Pt. Placed on telemetry monitor. CCMD notified. Fall safety discussed with pt. Pt. Verbalized understanding. Call light placed within reach. RN will continue to monitor pt. For changes in condition. Alpha Chouinard, Katherine Roan Blood pressure (!) 104/58, pulse 64, temperature 98.7 F (37.1 C), temperature source Oral, resp. rate 16, weight 56 kg (123 lb 6.4 oz), SpO2 96 %.

## 2015-12-28 NOTE — Consult Note (Signed)
   [facility] CM Inpatient Consult   12/28/2015  [name] 07-14-1925 [id number]  Patient was assessed for [place] Management for community services. Patient was previously active with [facility].  Chart review reveals that the patient  is an 82 year old female who was standing in her kitchen getting ready to prepare some food when she found herself on the ground. She had fallen backward and struck her head. She does not remember losing her balance or tripping. She is not sure how long she was on the ground unconscious. She was unable to get herself up from the ground and her daughter found her there. Her daughter had been gone approximately one hour. There is no evidence of loss of bladder control.  Met with patient who was asleep but her daughter [name] was at the bedside regarding being restarted with [facility] services. Consent form on file is signed and folder with [place] Management information given. Daughter states, "It would be nice to have someone to follow up with mom.  [name] states, "I'm sorry I have to step out for a minute but that will be fine to have the nurse follow up with [place],  Of note, [facility] Care Management services does not replace or interfere with any services that are arranged by inpatient case management or social work. For additional questions or referrals please contact:  [name], RN BSN [facility] Liaison  [phone] business mobile phone Toll free office [phone]

## 2015-12-28 NOTE — Telephone Encounter (Signed)
Needs TOC discharge date 12/28/15 HFU 01/04/16

## 2015-12-28 NOTE — Discharge Summary (Signed)
Name: [name] MRN: [id number] DOB: 1926-03-21 82 y.o. PCP: Sid Falcon, MD  Date of Admission: 12/27/2015  3:19 PM Date of Discharge: 12/31/2015 Attending Physician: No att. providers found  Discharge Diagnosis:  Principal Problem:   Syncope Active Problems:   Chronic diastolic heart failure (HCC)   Hypertension   Atrial fibrillation, controlled (HCC)   Pacemaker-Medtronic   GERD (gastroesophageal reflux disease)   CKD (chronic kidney disease) stage 3, GFR 30-59 ml/min   Discharge Medications:   Medication List    STOP taking these medications   cetirizine 1 MG/ML syrup Commonly known as:  ZYRTEC   dextromethorphan-guaiFENesin 30-600 MG 12hr tablet Commonly known as:  [place] DM     TAKE these medications   aspirin EC 81 MG tablet Take 81 mg by mouth daily.   diclofenac sodium 1 % Gel Commonly known as:  VOLTAREN Apply 2 g topically 4 (four) times daily. To left shoulder.   ENSURE ACTIVE HEART HEALTH Liqd Take 237 mLs by mouth 3 (three) times daily.   furosemide 20 MG tablet Commonly known as:  LASIX Take 2 tabs (40mg ) every morning and 2 tabs (40mg ) every afternoon. What changed:  additional instructions   lisinopril 20 MG tablet Commonly known as:  PRINIVIL,ZESTRIL TAKE 1 TABLET BY MOUTH DAILY   metoprolol 100 MG tablet Commonly known as:  LOPRESSOR TAKE 1 TABLET(100 MG) BY MOUTH TWICE DAILY   potassium chloride 20 MEQ/15ML (10%) Soln TAKE 15 MLS BY MOUTH DAILY   sulfamethoxazole-trimethoprim 800-160 MG tablet Commonly known as:  BACTRIM DS,SEPTRA DS Take 1 tablet by mouth 2 (two) times daily.   VESICARE 10 MG tablet Generic drug:  solifenacin TAKE 1 TABLET BY MOUTH EVERY DAY       Disposition and follow-up:   [name] was discharged from [facility] in Good condition.  At the hospital follow up visit please address:  1.  Syncope: Please assess for recurrent symptoms.   2.  Labs / imaging needed at  time of follow-up: CBC for thrombocytopenia  3.  Pending labs/ test needing follow-up: Urine Culture sensitivities  Follow-up Appointments: Follow-up Information    [facility] Go on 01/04/2016.   Why:  Appointment is at 1:45 PM Contact information: [address] .   Why:  HHRN, PT, OT , and AIDE Contact information: [place] [address] [phone]           Hospital Course by problem list: Principal Problem:   Syncope Active Problems:   Chronic diastolic heart failure (HCC)   Hypertension   Atrial fibrillation, controlled (HCC)   Pacemaker-Medtronic   GERD (gastroesophageal reflux disease)   CKD (chronic kidney disease) stage 3, GFR 30-59 ml/min   1. Syncope: The patient had a negative workup for ACS, and was found to not have orthostatic vital sign changes. Given concern for underlying arrhythmia, EP was consulted yesterday who stated her pacemaker showed no evidence of tachycardia or bradycardia and did not feel her syncopal was due to an underlying arrhythmia. Possibly due to UTI since her orthostatic were negative. Echo showed EF 35-40%, stable from prior, moderate to severe regurgitation and severe tricuspid valve regurgitation. Telemetry has no concerning events. Syncope likely secondary to underlying UTI versus cardiomyopathy from severe MV and TV regurgitation.  2. Urinary Tract Infection: The patient was noted to have a  U/A on admission showing many bacteria, moderate leukocytes, positive nitrites, and 6-30 WBC. She denies recent dysuria, and says she has recently had increased frequency which she attributed to recently increased furosemide dose. She was given IV Ceftriaxone and then transitioned to Bactrim. At the time of discharge, urine culture showed Coag negative Staph with sensitivities pending.  3. Heart Failure with reduced  Ejection Fraction: The patient had a repeat echocardiogram which showed an EF of 35-40% (compared with TTE performed 02/2015 which showed EF 30-35%). She was discharged on a lower dose of furosemide (40 mg qAM, 40 mg qhs), lisinopril, and metoprolol.  4. Thrombocytopenia: Chronic, but lower than baseline in the 80-90 range. Please repeat as outpatient.  5. Chronic Atrial Fibrillation: CHADS-VASc of 5, not on anticoagulation due to falls. This was continued years ago. Patient is on ASA 81 mg daily at home, rate controlled with metoprolol.   Discharge Vitals:   BP (!) 111/57 (BP Location: Right Arm)   Pulse 65   Temp 97.9 F (36.6 C) (Oral)   Resp 18   Wt 123 lb 9.6 oz (56.1 kg) Comment: scale c  SpO2 100%   BMI 24.14 kg/m   Pertinent Labs, Studies, and Procedures:  Transthoracic Echocardiogram (12/28/2015):  Study Conclusions  - Left ventricle: The cavity size was mildly dilated. Wall   thickness was normal. Systolic function was moderately reduced.   The estimated ejection fraction was in the range of 35% to 40%.   Moderate diffuse hypokinesis with no identifiable regional   variations. Doppler parameters are consistent with elevated mean   left atrial filling pressure. - Aortic valve: There was mild regurgitation. - Mitral valve: Moderate, holosystolicprolapse, involving the   anterior leaflet. There was moderate to severe regurgitation   directed eccentrically and posteriorly. - Left atrium: The atrium was severely dilated. - Right ventricle: The cavity size was moderately dilated. Systolic   function was moderately to severely reduced. - Right atrium: The atrium was severely dilated. - Tricuspid valve: There was severe regurgitation directed   centrally. - Pulmonary arteries: Systolic pressure was moderately to severely   increased. PA peak pressure: 71 mm Hg (S). - Pericardium, extracardiac: A trivial pericardial effusion was   identified posterior to the heart and  circumferential to the   heart.  Discharge Instructions:  Discharge Instructions    AMB Referral to [facility] Management    Complete by:  As directed    Reason for consult:  Post hospital follow up - restart services - TOC   Expected date of contact:  1-3 days (reserved for hospital discharges)   Please assign to community nurse for transition of care calls and assess for home visits. Contact daughter for follow up.  Consent on file.   Previously active, [employer].  Questions please call:   [name], RN BSN [facility] Liaison  [phone] business mobile phone Toll free office [phone]   Diet - low sodium heart healthy    Complete by:  As directed    Increase activity slowly    Complete by:  As directed       Signed: Martyn Malay, DO PGY-3 Internal Medicine Resident Pager # [phone] 12/31/2015 5:41 PM

## 2015-12-28 NOTE — Care Management Obs Status (Signed)
[facility] NOTIFICATION   Patient Details  Name: DAYAN CLEGG MRN: [id number] Date of Birth: Jun 17, 1925   Medicare Observation Status Notification Given:  Yes    Pollie Friar, RN 12/28/2015, 2:27 PM

## 2015-12-28 NOTE — Progress Notes (Signed)
Subjective: Patient resting comfortably in bed. Notes mild soreness in L shoulder since fall. No concern for fracture or tendinous injury. Patient denies chest pain, shortness of breath, palpitations, or headache.   Objective: Vital signs in last 24 hours: Vitals:   12/27/15 2041 12/27/15 2335 12/27/15 2355 12/28/15 0352  BP: 131/82 130/75  (!) 104/58  Pulse: 88 91  64  Resp: 20   16  Temp: 99.9 F (37.7 C)  98.5 F (36.9 C) 98.7 F (37.1 C)  TempSrc: Oral  Oral Oral  SpO2: 95%  97% 96%  Weight: 122 lb 12.8 oz (55.7 kg)   123 lb 6.4 oz (56 kg)   Weight change:   Intake/Output Summary (Last 24 hours) at 12/28/15 1016 Last data filed at 12/28/15 0859  Gross per 24 hour  Intake          1453.75 ml  Output              800 ml  Net           653.75 ml   BP (!) 104/58 (BP Location: Left Arm)   Pulse 64   Temp 98.7 F (37.1 C) (Oral)   Resp 16   Wt 123 lb 6.4 oz (56 kg) Comment: scale c  SpO2 96%   BMI 24.10 kg/m    General appearance: alert, cooperative, appears stated age and no distress Head: Normocephalic, without obvious abnormality, atraumatic Neck: JVD - 4 cm above sternal notch and supple, symmetrical, trachea midline Lungs: Diffuse crackles present Heart: irregularly irregular rhythm Abdomen: soft, non-tender; bowel sounds normal; no masses,  no organomegaly Extremities: edema 2+ pitting edema to knees bilaterally Pulses: 2+ and symmetric Skin: Skin color, texture, turgor normal. No rashes or lesions    Lab Results: Basic Metabolic Panel:  Recent Labs Lab 12/27/15 1600 12/27/15 2158 12/28/15 0338  NA 138  --  141  K 5.0  --  4.3  CL 104  --  106  CO2 22  --  29  GLUCOSE 164*  --  124*  BUN 19  --  17  CREATININE 1.32* 1.29* 1.22*  CALCIUM 9.6  --  9.2   Liver Function Tests:  Recent Labs Lab 12/27/15 1600  AST 39  ALT 16  ALKPHOS 144*  BILITOT 2.3*  PROT 7.7  ALBUMIN 3.5   No results for input(s): LIPASE, AMYLASE in the last 168  hours. No results for input(s): AMMONIA in the last 168 hours. CBC:  Recent Labs Lab 12/27/15 1600 12/27/15 2158 12/28/15 0338  WBC 7.3 6.4 5.2  NEUTROABS 5.9  --   --   HGB 12.0 10.5* 10.1*  HCT 35.9* 32.0* 31.4*  MCV 94.7 94.1 94.0  PLT 114* 89* 92*   Cardiac Enzymes:  Recent Labs Lab 12/27/15 2158 12/28/15 0338  TROPONINI <0.03 <0.03   BNP: No results for input(s): PROBNP in the last 168 hours. D-Dimer: No results for input(s): DDIMER in the last 168 hours. CBG:  Recent Labs Lab 12/27/15 2134 12/28/15 0608  GLUCAP 129* 106*   Hemoglobin A1C: No results for input(s): HGBA1C in the last 168 hours. Fasting Lipid Panel: No results for input(s): CHOL, HDL, LDLCALC, TRIG, CHOLHDL, LDLDIRECT in the last 168 hours. Thyroid Function Tests: No results for input(s): TSH, T4TOTAL, FREET4, T3FREE, THYROIDAB in the last 168 hours. Coagulation:  Recent Labs Lab 12/28/15 0338  LABPROT 18.1*  INR 1.48   Anemia Panel: No results for input(s): VITAMINB12, FOLATE, FERRITIN, TIBC, IRON, RETICCTPCT in  the last 168 hours. Urine Drug Screen: Drugs of Abuse  No results found for: LABOPIA, COCAINSCRNUR, LABBENZ, AMPHETMU, THCU, LABBARB  Alcohol Level: No results for input(s): ETH in the last 168 hours. Urinalysis:  Recent Labs Lab 12/27/15 1930  COLORURINE YELLOW  LABSPEC 1.014  PHURINE 8.0  GLUCOSEU NEGATIVE  HGBUR NEGATIVE  BILIRUBINUR NEGATIVE  KETONESUR NEGATIVE  PROTEINUR NEGATIVE  NITRITE POSITIVE*  LEUKOCYTESUR MODERATE*    Micro Results: No results found for this or any previous visit (from the past 240 hour(s)). Studies/Results: Dg Chest 1 View  Result Date: 12/27/2015 CLINICAL DATA:  Fall.  Syncope.  Pain. EXAM: CHEST 1 VIEW COMPARISON:  06/26/2015 chest radiograph. FINDINGS: Stable configuration of single lead left subclavian pacemaker with lead tip overlying the right ventricular apex. Stable cardiomediastinal silhouette with prominent cardiomegaly  and aortic atherosclerosis. No pneumothorax. No pleural effusion. No overt pulmonary edema. No acute consolidative airspace disease. No displaced fracture in the visualized chest. IMPRESSION: Stable prominent cardiomegaly without overt pulmonary edema. No active pulmonary disease. Electronically Signed   By: Ilona Sorrel M.D.   On: 12/27/2015 17:12   Dg Pelvis 1-2 Views  Result Date: 12/27/2015 CLINICAL DATA:  Fall with left hip pain.  Initial encounter. EXAM: PELVIS - 1-2 VIEW COMPARISON:  None. FINDINGS: Limited visualization of the sacrum due to overlapping stool and atherosclerotic calcification. Sensitivity is further degraded by rotation. No visible fracture or diastasis. Both hips are located. Osteopenia. IMPRESSION: No acute finding. Electronically Signed   By: Monte Fantasia M.D.   On: 12/27/2015 17:13   Ct Head Wo Contrast  Result Date: 12/27/2015 CLINICAL DATA:  fall syncope pain EXAM: CT HEAD WITHOUT CONTRAST CT CERVICAL SPINE WITHOUT CONTRAST TECHNIQUE: Multidetector CT imaging of the head and cervical spine was performed following the standard protocol without intravenous contrast. Multiplanar CT image reconstructions of the cervical spine were also generated. COMPARISON:  None. FINDINGS: CT HEAD FINDINGS Brain: There is patchy low attenuation throughout the subcortical and periventricular white matter compatible with chronic microvascular disease. There is encephalomalacia involving the right posterior frontal lobe compatible with previous cortical infarct. Prominence of the sulci and ventricles are identified compatible with brain atrophy. There is no abnormal extra-axial fluid collection, intracranial hemorrhage or mass identified. No evidence for acute brain infarct. Vascular: No hyperdense vessel or unexpected calcification. Skull: Normal. Negative for fracture or focal lesion. Sinuses/Orbits: No acute finding. Other: None CT CERVICAL SPINE FINDINGS Alignment: Normal. Skull base and  vertebrae: No acute fracture. No primary bone lesion or focal pathologic process. Soft tissues and spinal canal: No prevertebral fluid or swelling. No visible canal hematoma. Disc levels: There is multi level degenerative disc disease identified throughout the cervical spine. This is most advanced at C2-3, C3-4 and C5-6. Upper chest: Negative. Other: None IMPRESSION: 1. No acute intracranial abnormalities. Chronic microvascular disease and brain atrophy. There is an old right cerebral hemisphere infarct. 2. No evidence for cervical spine fracture. Multi level degenerative disc disease is identified. Electronically Signed   By: Kerby Moors M.D.   On: 12/27/2015 17:11   Ct Cervical Spine Wo Contrast  Result Date: 12/27/2015 CLINICAL DATA:  fall syncope pain EXAM: CT HEAD WITHOUT CONTRAST CT CERVICAL SPINE WITHOUT CONTRAST TECHNIQUE: Multidetector CT imaging of the head and cervical spine was performed following the standard protocol without intravenous contrast. Multiplanar CT image reconstructions of the cervical spine were also generated. COMPARISON:  None. FINDINGS: CT HEAD FINDINGS Brain: There is patchy low attenuation throughout the subcortical and periventricular white  matter compatible with chronic microvascular disease. There is encephalomalacia involving the right posterior frontal lobe compatible with previous cortical infarct. Prominence of the sulci and ventricles are identified compatible with brain atrophy. There is no abnormal extra-axial fluid collection, intracranial hemorrhage or mass identified. No evidence for acute brain infarct. Vascular: No hyperdense vessel or unexpected calcification. Skull: Normal. Negative for fracture or focal lesion. Sinuses/Orbits: No acute finding. Other: None CT CERVICAL SPINE FINDINGS Alignment: Normal. Skull base and vertebrae: No acute fracture. No primary bone lesion or focal pathologic process. Soft tissues and spinal canal: No prevertebral fluid or  swelling. No visible canal hematoma. Disc levels: There is multi level degenerative disc disease identified throughout the cervical spine. This is most advanced at C2-3, C3-4 and C5-6. Upper chest: Negative. Other: None IMPRESSION: 1. No acute intracranial abnormalities. Chronic microvascular disease and brain atrophy. There is an old right cerebral hemisphere infarct. 2. No evidence for cervical spine fracture. Multi level degenerative disc disease is identified. Electronically Signed   By: Kerby Moors M.D.   On: 12/27/2015 17:11   Medications: I have reviewed the patient's current medications. Scheduled Meds: . aspirin EC  81 mg Oral Daily  . cefTRIAXone (ROCEPHIN)  IV  1 g Intravenous Q24H  . feeding supplement (ENSURE ENLIVE)  237 mL Oral TID  . furosemide  40 mg Oral q1800  . furosemide  60 mg Oral Daily  . lisinopril  20 mg Oral Daily  . metoprolol  100 mg Oral BID  . sodium chloride flush  3 mL Intravenous Q12H   Continuous Infusions:  PRN Meds:.acetaminophen **OR** acetaminophen   Assessment/Plan: Principal Problem:   Syncope Active Problems:   Chronic diastolic heart failure (HCC)   Hypertension   Atrial fibrillation, controlled (HCC)   Pacemaker-Medtronic   GERD (gastroesophageal reflux disease)   CKD (chronic kidney disease) stage 3, GFR 30-59 ml/min  Syncope: Patient denies chest pain, however, EKG with inverted T-waves more pronounced from prior tracing. Coronary risk factors include DM (not currently on medication) and HTN. Patient also with recent increase in home lasix dose. Patient also has single lead RV pacemaker, no ICD. Etiology likely arrythymia vs. Hypovolemia vs. UTI   - Troponins negative   - Tele monitoring    - Hold evening lasix dose   - S/p 500 cc bolus in ED   - Gentle fluids NS 75 cc/hr for 8 hours (in the setting of HFrEF)   - Electrophysiology consult for pacemaker interrogation   - Obtain additional formal EKG for review.  UTI: Patient denies  dysuria, but U/A showed many bacteria, moderate leukocytes, positive nitrites, and 6-30 WBC, concerning for UTI. Patient endorses increased frequency, which she attributes to recent increase in furosemide dose.    - IV CTX   - UCx pending   - Transition to oral medications tomorrow or today if discharged  HFrEF: TTE in 02/2015 with EF 30-35%. Pacemaker in place, last interrogated on 10/12/15 with normal function. Lasix was recently increased last Thursday from 40 mg BID to 60 mg in AM and 40 mg PM for worsening lower extremity edema.    - ECHO pending    - Continue metoprolol 100 mg BID   - Restart furosemide 60 mg qAM and 40 mg QHS   Thrombocytopenia: Platelet count of 114 on admission. Likely chronic as thrombocytopenia is listed under her problem list. No signs of active bleeding at this time.  Atrial fibrillation: Chronic, taken off warfarin a few years ago due to  falls and bleeding risk.   - Continue ASA 81 daily   HTN: Normotensive   - Hold home lisinopril 20 mg daily   CKD Stage 3: Creatinine at baseline (1.3) on admission   - Avoid nephrotoxic meds   FEN: NS 75 cc x 8 hours, replete lytes prn, heart healthy diet VTE ppx: SCDs Code Status: FULL   Dispo: Observation with expected length of stay less than 2 midnights.  This is a Careers information officer Note.  The care of the patient was discussed with Dr. Evette Doffing and the assessment and plan formulated with their assistance.  Please see their attached note for official documentation of the daily encounter.   LOS: 0 days   Jethro Bolus, Medical Student 12/28/2015, 10:16 AM

## 2015-12-29 DIAGNOSIS — I482 Chronic atrial fibrillation: Secondary | ICD-10-CM | POA: Diagnosis not present

## 2015-12-29 DIAGNOSIS — I130 Hypertensive heart and chronic kidney disease with heart failure and stage 1 through stage 4 chronic kidney disease, or unspecified chronic kidney disease: Secondary | ICD-10-CM | POA: Diagnosis not present

## 2015-12-29 DIAGNOSIS — I5022 Chronic systolic (congestive) heart failure: Secondary | ICD-10-CM | POA: Diagnosis not present

## 2015-12-29 DIAGNOSIS — R55 Syncope and collapse: Secondary | ICD-10-CM | POA: Diagnosis not present

## 2015-12-29 LAB — GLUCOSE, CAPILLARY: Glucose-Capillary: 103 mg/dL — ABNORMAL HIGH (ref 65–99)

## 2015-12-29 MED ORDER — FUROSEMIDE 20 MG PO TABS: ORAL_TABLET | ORAL | 3 refills | Status: DC

## 2015-12-29 MED ORDER — DICLOFENAC SODIUM 1 % TD GEL
2.0000 g | Freq: Four times a day (QID) | TRANSDERMAL | Status: DC
  Administered 2015-12-29: 2 g via TOPICAL
  Filled 2015-12-29: qty 100

## 2015-12-29 MED ORDER — DICLOFENAC SODIUM 1 % TD GEL: 2.0000 g | Freq: Four times a day (QID) | TRANSDERMAL | 1 refills | Status: DC

## 2015-12-29 MED ORDER — SULFAMETHOXAZOLE-TRIMETHOPRIM 800-160 MG PO TABS: 1.0000 | ORAL_TABLET | Freq: Two times a day (BID) | ORAL | 0 refills | Status: DC

## 2015-12-29 NOTE — Discharge Instructions (Signed)
·   Thank you for allowing [place] to be involved in your healthcare while you were hospitalized at [facility].   Please note that there have been changes to your home medications.  --> PLEASE LOOK AT YOUR DISCHARGE MEDICATION LIST FOR DETAILS.  Please call your PCP if you have any questions or concerns, or any difficulty getting any of your medications.  Please return to the ER if you have worsening of your symptoms or new severe symptoms arise.  For your urinary tract infection, please take Bactrim 1 pill twice a day for 2 more doses on 12/30/15.  For your Furosemide: Take 2 tabs (40 mg) every morning and 2 tabs (40 mg) every afternoon.  Continue all other medications the same.   Please follow up in the Internal Medicine Clinic on 01/04/16 at 1:45 pm.

## 2015-12-29 NOTE — Progress Notes (Signed)
   Subjective: Patient was seen and examined this morning. She feels well- no complaints of chest pain, shortness of breath, syncope or lightheadedness.   Objective: Vital signs in last 24 hours: Vitals:   12/28/15 1255 12/28/15 1258 12/28/15 2024 12/29/15 0636  BP: (!) 94/41 (!) 110/44 119/60 (!) 111/57  Pulse: 73  67 65  Resp: 16  18 18   Temp: 97.9 F (36.6 C)  98.1 F (36.7 C) 97.9 F (36.6 C)  TempSrc: Oral  Oral Oral  SpO2: 100%  100% 100%  Weight:    123 lb 9.6 oz (56.1 kg)   Physical Exam General: Vital signs reviewed.  Patient is elderly, in no acute distress and cooperative with exam.  Neck: Supple, trachea midline. Cardiovascular: Irregularly irregular, 2/6 systolic murmur. Pulmonary/Chest: Clear to auscultation bilaterally, no wheezes, rales, or rhonchi. Neuro: Awake and alert, eating breakfast at the side of the bed  Assessment/Plan: Principal Problem:   Syncope Active Problems:   Chronic diastolic heart failure (HCC)   Hypertension   Atrial fibrillation, controlled (HCC)   Pacemaker-Medtronic   GERD (gastroesophageal reflux disease)   CKD (chronic kidney disease) stage 3, GFR 30-59 ml/min  Patient is an 82 yo F with a pmhx of HFrEF (30-35%) with pacemaker in place, chronic a-fib, and CKD stage 3 who presents after a syncopal episode from home.   Syncope: Given concern for underlying arrhythmia, EP was consulted yesterday who stated her pacemaker showed no evidence of tachycardia or bradycardia and did not feel her syncopal was due to an underlying arrhythmia. Possibly due to UTI since her orthostatic were negative. Echo showed EF 35-40%, stable from prior, moderate to severe regurgitation and severe tricuspid valve regurgitation. Telemetry has no concerning events. -Likely discharge to home today -PT/OT evaluation -Treating UTI  UTI: UA on admission whows many bacteria, moderate leukocytes, positive nitrites, and 6-30 WBC with few squamous cell concerning  for a UTI.  -Ceftriaxone -UCx pending -Transition to oral medications   Chronic HFrEF: TTE in 02/2015 with EF 30-35%. Pacemaker in place, last interrogated on 10/12/15 with normal function. Patient is on Lasix, Metoprolol and Lisinopril at home. Echo showed EF 35-40%, stable from prior, moderate to severe regurgitation and severe tricuspid valve regurgitation.  -Continue metoprolol 100 mg BID -Lasix 60 mg QAM and 40 mg QPM -Lisinopril 20 mg daily  Thrombocytopenia: Chronic, but lower than baseline in the 80-90 range.  -Repeat as oupatient  Chronic Atrial Fibrillation: CHADS-VASc of 5, not on anticoagulation due to falls. This was continued years ago. Patient is on ASA 81 mg daily at home, rate controlled. -Continue ASA 81 daily   FEN: HH DVT/PE ppx: SCDs Code Status: FULL   Dispo: Anticipated discharge in approximately 0 day(s).   LOS: 0 days   Martyn Malay, DO PGY-3 Internal Medicine Resident Pager # [phone] 12/29/2015 8:51 AM

## 2015-12-29 NOTE — Evaluation (Signed)
Occupational Therapy Evaluation Patient Details Name: [name] MRN: [id number] DOB: December 05, 1925 Today's Date: 12/29/2015    History of Present Illness 82 yo female s/p fall at home striking head. UTI (+)    Past Medical History:  Diagnosis Date  . Adenomatous colon polyp    tubular  . Allergic rhinitis   . Anemia    Iron deficiency, does not tolerate po  . Arthritis    "all over my body"  . Atrial fibrillation (HCC)    on coumadin followed by Dr. Elie Confer  . CHF (congestive heart failure) (HCC)    diastolic dysfunction  . Constipation   . Diverticulosis of colon (without mention of hemorrhage)   . DJD (degenerative joint disease)   . GERD (gastroesophageal reflux disease)   . History of sick sinus syndrome s/p pacemaker Dr. Lovena Le 2000  . History of trichomonal urethritis   . History of vaginal bleeding 05/2005   nagative endometrial biopsy  . Hypertension    well controlled on 3 agents  . Incontinence overflow, stress female    on vesicare  . Onychomycosis   . Osteoporosis   . Pacemaker   . Restrictive lung disease 1996   PFT's showed mild disease  . Stroke [facility]) 2002   R MCA, cardioembolic  . Type II diabetes mellitus (HCC)    diet controlled  . Weight loss, unintentional 2010-2011   8/08-8/10 138-146 lbs, between 8/10-10/11 lost 18 lbs., work-up negative      Clinical Impression   PT admitted with syncope with workup underway. Pt currently with functional limitiations due to the deficits listed below (see OT problem list). PTA was home alone mod I with cane.  Pt will benefit from skilled OT to increase their independence and safety with adls and balance to allow discharge SNF. Pt with orthostatic BP at this time and high fall risk lives alone. If family could provide 24/7 (A) could possibly d/c home with (A).      Follow Up Recommendations  SNF    Equipment Recommendations       Recommendations for Other Services       Precautions / Restrictions  Precautions Precautions: Fall Precaution Comments: watch BP during session Restrictions Weight Bearing Restrictions: No      Mobility Bed Mobility Overal bed mobility: Needs Assistance Bed Mobility: Supine to Sit     Supine to sit: Supervision     General bed mobility comments: Pt in chair on arrival.  Transfers Overall transfer level: Needs assistance Equipment used: Straight cane Transfers: Sit to/from Stand Sit to Stand: Supervision              Balance Overall balance assessment: Needs assistance Sitting-balance support: Bilateral upper extremity supported;Feet supported Sitting balance-Leahy Scale: Fair     Standing balance support: Single extremity supported;During functional activity Standing balance-Leahy Scale: Fair Standing balance comment: Pt was able to stand at sink and wash hands with supervision.              High level balance activites: Direction changes;Turns;Sudden stops High Level Balance Comments: All of above with supervision with cane.            ADL Overall ADL's : Needs assistance/impaired Eating/Feeding: Set up   Grooming: Wash/dry face;Wash/dry hands;Min guard;Sitting   Upper Body Bathing: Minimal assitance;Sitting   Lower Body Bathing: Minimal assistance;Sit to/from stand           Toilet Transfer: Min guard     Toileting - Water quality scientist Details (  indicate cue type and reason): pt reports at times having incontinence     Functional mobility during ADLs: Min guard General ADL Comments: pt lacks awareness to deficits and states "i am going to sit i am tired" but lacks awareness to sudden fatigue. pt reports head is sore where she struck it but otherwise no pain. pt when sitting states oh easy going to take awhile for that soreness to leave.      Vision Vision Assessment?: Vision impaired- to be further tested in functional context Additional Comments: decr peripherial field    Perception     Praxis       Pertinent Vitals/Pain Pain Assessment: Faces Faces Pain Scale: Hurts a little bit Pain Location: head Pain Descriptors / Indicators: Headache Pain Intervention(s): Limited activity within patient's tolerance;Monitored during session;Repositioned     Hand Dominance Right   Extremity/Trunk Assessment Upper Extremity Assessment Upper Extremity Assessment: Defer to OT evaluation   Lower Extremity Assessment Lower Extremity Assessment: Generalized weakness RLE Deficits / Details: reports "i aint having no surgery. i aint seen him yet but they aint cutting on me" Pt with pending appointment to address R knee pain   Cervical / Trunk Assessment Cervical / Trunk Assessment: Kyphotic   Communication Communication Communication: HOH   Cognition Arousal/Alertness: Awake/alert Behavior During Therapy: Impulsive Overall Cognitive Status: Impaired/Different from baseline Area of Impairment: Attention;Orientation;Memory;Following commands;Safety/judgement;Awareness;Problem solving Orientation Level: Disoriented to;Time;Place Current Attention Level: Sustained Memory: Decreased short-term memory Following Commands: Follows multi-step commands inconsistently Safety/Judgement: Decreased awareness of safety;Decreased awareness of deficits Awareness: Intellectual Problem Solving: Slow processing;Difficulty sequencing General Comments: Pt reports "i woke up this morning thanked the lord for my home and said lord it is good to be home and then looked around and said no I aint home. i told my children and they said mama you crazy" pt little tangential at times with speech.  OT was able to cross completely into midline of vision stating patients name and hello prior to patients awareness of therapist presence. Pt states "OH hey I was looking up here at this " Pt showing visual changes and lack of awareness to visual input along with cognitive changes   General Comments       Exercises        Shoulder Instructions      Home Living Family/patient expects to be discharged to:: Private residence Living Arrangements: Alone Available Help at Discharge: Family;Available PRN/intermittently Type of Home: House Home Access: Level entry     Home Layout: One level     Bathroom Shower/Tub: Occupational psychologist: Standard     Home Equipment: Cane - single point;Bedside commode   Additional Comments: pt reports she has 8 children and a daughter lives close by. pt reports she does not drive and that children get all the groceries and drive her to appointments. pt does all her household chores and cooks all her meals      Prior Functioning/Environment Level of Independence: Independent        Comments: children come by daily and wash pts clothes, clean house.    OT Diagnosis: Generalized weakness;Acute pain   OT Problem List: Decreased strength;Decreased activity tolerance;Impaired balance (sitting and/or standing);Decreased safety awareness;Decreased knowledge of use of DME or AE;Decreased knowledge of precautions;Pain;Decreased cognition;Impaired vision/perception   OT Treatment/Interventions: Self-care/ADL training;DME and/or AE instruction;Energy conservation;Therapeutic activities;Balance training;Patient/family education;Visual/perceptual remediation/compensation;Cognitive remediation/compensation    OT Goals(Current goals can be found in the care plan section) Acute Rehab OT Goals Patient Stated  Goal: to gohome OT Goal Formulation: With patient Time For Goal Achievement: 01/12/16 Potential to Achieve Goals: Good  OT Frequency: Min 2X/week   Barriers to D/C:            Co-evaluation              End of Session Nurse Communication: Mobility status;Precautions  Activity Tolerance: Patient tolerated treatment well Patient left: in chair;with call bell/phone within reach;with chair alarm set   Time: [id number] OT Time Calculation (min): 25  min Charges:  OT General Charges $OT Visit: 1 Procedure OT Evaluation $OT Eval Moderate Complexity: 1 Procedure G-Codes: OT G-codes **NOT FOR INPATIENT CLASS** Functional Assessment Tool Used: clinical judgement Functional Limitation: Self care Self Care Current Status [id number]): At least 20 percent but less than 40 percent impaired, limited or restricted Self Care Goal Status [id number]): At least 1 percent but less than 20 percent impaired, limited or restricted  Peri Maris 12/29/2015, 9:49 AM  Jeri Modena   OTR/L Pager: [phone] Office: [phone] .

## 2015-12-29 NOTE — Care Management Note (Signed)
Case Management Note  Patient Details  Name: [name] MRN: [id number] Date of Birth: October 26, 1925  Subjective/Objective:     NCM spoke with patient and her daughter , [name] , they chose [facility] from [employer] , for [facility], PT, OT, and Aide,  Referral made to [place] with [facility] .  Patient informed tub bench is $ 62.00  ,she decided she did not want the tub bench.  [name] states she will be transporting patient home today.  She has a pcp, and medication coverage thru insurance.  Patient is for dc today.               Action/Plan:   Expected Discharge Date:                  Expected Discharge Plan:  [place]  In-House Referral:     Discharge planning Services  CM Consult  Post Acute Care Choice:    Choice offered to:  Adult Children  DME Arranged:    DME Agency:     HH Arranged:  RN, PT, OT, Nurse's Aide [place] Agency:     Status of Service:  Completed, signed off  If discussed at [place] of Stay Meetings, dates discussed:    Additional Comments:  Zenon Mayo, RN 12/29/2015, 11:02 AM

## 2015-12-29 NOTE — Progress Notes (Signed)
Subjective: Patient resting comfortably in bed. Notes mild soreness in L shoulder since fall. No concern for fracture or tendinous injury. Patient continues to deny chest pain, shortness of breath, palpitations, or headache.   Objective: Vital signs in last 24 hours: Vitals:   12/28/15 1255 12/28/15 1258 12/28/15 2024 12/29/15 0636  BP: (!) 94/41 (!) 110/44 119/60 (!) 111/57  Pulse: 73  67 65  Resp: 16  18 18   Temp: 97.9 F (36.6 C)  98.1 F (36.7 C) 97.9 F (36.6 C)  TempSrc: Oral  Oral Oral  SpO2: 100%  100% 100%  Weight:    123 lb 9.6 oz (56.1 kg)   Weight change: 12.8 oz (0.363 kg)  Intake/Output Summary (Last 24 hours) at 12/29/15 1031 Last data filed at 12/29/15 [id number]  Gross per 24 hour  Intake              890 ml  Output              850 ml  Net               40 ml   BP (!) 111/57 (BP Location: Right Arm)   Pulse 65   Temp 97.9 F (36.6 C) (Oral)   Resp 18   Wt 123 lb 9.6 oz (56.1 kg) Comment: scale c  SpO2 100%   BMI 24.14 kg/m    General appearance: alert, cooperative, appears stated age and no distress Head: Normocephalic, without obvious abnormality, atraumatic Neck: JVD - 4 cm above sternal notch and supple, symmetrical, trachea midline Lungs: Diffuse crackles present Heart: irregularly irregular rhythm Abdomen: soft, non-tender; bowel sounds normal; no masses,  no organomegaly Extremities: edema 2+ pitting edema to knees bilaterally Pulses: 2+ and symmetric Skin: Skin color, texture, turgor normal. No rashes or lesions    Lab Results: Basic Metabolic Panel:  Recent Labs Lab 12/27/15 1600 12/27/15 2158 12/28/15 0338  NA 138  --  141  K 5.0  --  4.3  CL 104  --  106  CO2 22  --  29  GLUCOSE 164*  --  124*  BUN 19  --  17  CREATININE 1.32* 1.29* 1.22*  CALCIUM 9.6  --  9.2   Liver Function Tests:  Recent Labs Lab 12/27/15 1600  AST 39  ALT 16  ALKPHOS 144*  BILITOT 2.3*  PROT 7.7  ALBUMIN 3.5   No results for input(s): LIPASE,  AMYLASE in the last 168 hours. No results for input(s): AMMONIA in the last 168 hours. CBC:  Recent Labs Lab 12/27/15 1600 12/27/15 2158 12/28/15 0338  WBC 7.3 6.4 5.2  NEUTROABS 5.9  --   --   HGB 12.0 10.5* 10.1*  HCT 35.9* 32.0* 31.4*  MCV 94.7 94.1 94.0  PLT 114* 89* 92*   Cardiac Enzymes:  Recent Labs Lab 12/27/15 2158 12/28/15 0338  TROPONINI <0.03 <0.03   BNP: No results for input(s): PROBNP in the last 168 hours. D-Dimer: No results for input(s): DDIMER in the last 168 hours. CBG:  Recent Labs Lab 12/27/15 2134 12/28/15 0608 12/29/15 0708  GLUCAP 129* 106* 103*   Hemoglobin A1C: No results for input(s): HGBA1C in the last 168 hours. Fasting Lipid Panel: No results for input(s): CHOL, HDL, LDLCALC, TRIG, CHOLHDL, LDLDIRECT in the last 168 hours. Thyroid Function Tests: No results for input(s): TSH, T4TOTAL, FREET4, T3FREE, THYROIDAB in the last 168 hours. Coagulation:  Recent Labs Lab 12/28/15 0338  LABPROT 18.1*  INR 1.48  Anemia Panel: No results for input(s): VITAMINB12, FOLATE, FERRITIN, TIBC, IRON, RETICCTPCT in the last 168 hours. Urine Drug Screen: Drugs of Abuse  No results found for: LABOPIA, COCAINSCRNUR, LABBENZ, AMPHETMU, THCU, LABBARB  Alcohol Level: No results for input(s): ETH in the last 168 hours. Urinalysis:  Recent Labs Lab 12/27/15 1930  COLORURINE YELLOW  LABSPEC 1.014  PHURINE 8.0  GLUCOSEU NEGATIVE  HGBUR NEGATIVE  BILIRUBINUR NEGATIVE  KETONESUR NEGATIVE  PROTEINUR NEGATIVE  NITRITE POSITIVE*  LEUKOCYTESUR MODERATE*    Micro Results: No results found for this or any previous visit (from the past 240 hour(s)). Studies/Results: Dg Chest 1 View  Result Date: 12/27/2015 CLINICAL DATA:  Fall.  Syncope.  Pain. EXAM: CHEST 1 VIEW COMPARISON:  06/26/2015 chest radiograph. FINDINGS: Stable configuration of single lead left subclavian pacemaker with lead tip overlying the right ventricular apex. Stable  cardiomediastinal silhouette with prominent cardiomegaly and aortic atherosclerosis. No pneumothorax. No pleural effusion. No overt pulmonary edema. No acute consolidative airspace disease. No displaced fracture in the visualized chest. IMPRESSION: Stable prominent cardiomegaly without overt pulmonary edema. No active pulmonary disease. Electronically Signed   By: Ilona Sorrel M.D.   On: 12/27/2015 17:12   Dg Pelvis 1-2 Views  Result Date: 12/27/2015 CLINICAL DATA:  Fall with left hip pain.  Initial encounter. EXAM: PELVIS - 1-2 VIEW COMPARISON:  None. FINDINGS: Limited visualization of the sacrum due to overlapping stool and atherosclerotic calcification. Sensitivity is further degraded by rotation. No visible fracture or diastasis. Both hips are located. Osteopenia. IMPRESSION: No acute finding. Electronically Signed   By: Monte Fantasia M.D.   On: 12/27/2015 17:13   Ct Head Wo Contrast  Result Date: 12/27/2015 CLINICAL DATA:  fall syncope pain EXAM: CT HEAD WITHOUT CONTRAST CT CERVICAL SPINE WITHOUT CONTRAST TECHNIQUE: Multidetector CT imaging of the head and cervical spine was performed following the standard protocol without intravenous contrast. Multiplanar CT image reconstructions of the cervical spine were also generated. COMPARISON:  None. FINDINGS: CT HEAD FINDINGS Brain: There is patchy low attenuation throughout the subcortical and periventricular white matter compatible with chronic microvascular disease. There is encephalomalacia involving the right posterior frontal lobe compatible with previous cortical infarct. Prominence of the sulci and ventricles are identified compatible with brain atrophy. There is no abnormal extra-axial fluid collection, intracranial hemorrhage or mass identified. No evidence for acute brain infarct. Vascular: No hyperdense vessel or unexpected calcification. Skull: Normal. Negative for fracture or focal lesion. Sinuses/Orbits: No acute finding. Other: None CT  CERVICAL SPINE FINDINGS Alignment: Normal. Skull base and vertebrae: No acute fracture. No primary bone lesion or focal pathologic process. Soft tissues and spinal canal: No prevertebral fluid or swelling. No visible canal hematoma. Disc levels: There is multi level degenerative disc disease identified throughout the cervical spine. This is most advanced at C2-3, C3-4 and C5-6. Upper chest: Negative. Other: None IMPRESSION: 1. No acute intracranial abnormalities. Chronic microvascular disease and brain atrophy. There is an old right cerebral hemisphere infarct. 2. No evidence for cervical spine fracture. Multi level degenerative disc disease is identified. Electronically Signed   By: Kerby Moors M.D.   On: 12/27/2015 17:11   Ct Cervical Spine Wo Contrast  Result Date: 12/27/2015 CLINICAL DATA:  fall syncope pain EXAM: CT HEAD WITHOUT CONTRAST CT CERVICAL SPINE WITHOUT CONTRAST TECHNIQUE: Multidetector CT imaging of the head and cervical spine was performed following the standard protocol without intravenous contrast. Multiplanar CT image reconstructions of the cervical spine were also generated. COMPARISON:  None. FINDINGS: CT HEAD  FINDINGS Brain: There is patchy low attenuation throughout the subcortical and periventricular white matter compatible with chronic microvascular disease. There is encephalomalacia involving the right posterior frontal lobe compatible with previous cortical infarct. Prominence of the sulci and ventricles are identified compatible with brain atrophy. There is no abnormal extra-axial fluid collection, intracranial hemorrhage or mass identified. No evidence for acute brain infarct. Vascular: No hyperdense vessel or unexpected calcification. Skull: Normal. Negative for fracture or focal lesion. Sinuses/Orbits: No acute finding. Other: None CT CERVICAL SPINE FINDINGS Alignment: Normal. Skull base and vertebrae: No acute fracture. No primary bone lesion or focal pathologic process. Soft  tissues and spinal canal: No prevertebral fluid or swelling. No visible canal hematoma. Disc levels: There is multi level degenerative disc disease identified throughout the cervical spine. This is most advanced at C2-3, C3-4 and C5-6. Upper chest: Negative. Other: None IMPRESSION: 1. No acute intracranial abnormalities. Chronic microvascular disease and brain atrophy. There is an old right cerebral hemisphere infarct. 2. No evidence for cervical spine fracture. Multi level degenerative disc disease is identified. Electronically Signed   By: Kerby Moors M.D.   On: 12/27/2015 17:11   Medications: I have reviewed the patient's current medications. Scheduled Meds: . aspirin EC  81 mg Oral Daily  . cefTRIAXone (ROCEPHIN)  IV  1 g Intravenous Q24H  . diclofenac sodium  2 g Topical QID  . feeding supplement (ENSURE ENLIVE)  237 mL Oral TID  . furosemide  40 mg Oral q1800  . furosemide  60 mg Oral Daily  . lisinopril  20 mg Oral Daily  . metoprolol  100 mg Oral BID  . sodium chloride flush  3 mL Intravenous Q12H   Continuous Infusions:  PRN Meds:.acetaminophen **OR** acetaminophen   Assessment/Plan: Principal Problem:   Syncope Active Problems:   Chronic diastolic heart failure (HCC)   Hypertension   Atrial fibrillation, controlled (HCC)   Pacemaker-[employer]   GERD (gastroesophageal reflux disease)   CKD (chronic kidney disease) stage 3, GFR 30-59 ml/min  Syncope: Negative for ACS. Patient with recent increase in home furosemide dose. Patient also has single lead RV pacemaker, no ICD. EP interrogated her pacemaker which showed no bradycardic or tachycardic events. TTE showed moderate-severe MR and severe TR, increased PA pressures. Etiology likely acutely worsening RHF vs. Hypovolemia vs. UTI   - Troponins negative   - Tele monitoring    - Hold evening lasix dose   - S/p 500 cc bolus in ED   - Gentle fluids NS 75 cc/hr for 8 hours (in the setting of HFrEF)   - Consider decreasing home  furosemide dose back because patient is so preload dependent  UTI: Patient denies dysuria, but U/A showed many bacteria, moderate leukocytes, positive nitrites, and 6-30 WBC, concerning for UTI. Patient endorses increased frequency, which she attributes to recent increase in furosemide dose.    - s/p IV CTX x 2 days   - UCx pending   - Transition to Bactrim tomorrow or today if discharged  HFrEF: TTE in 02/2015 with EF 30-35%, yesterday showed EF 35-40%, moderate MR, severe TR, severely increased PA pressures. Pacemaker in place, last interrogated on 12/28/15 with normal function. Furosemide was recently increased last Thursday from 40 mg BID to 60 mg in AM and 40 mg PM for worsening lower extremity edema.    - Continue metoprolol 100 mg BID   - Restart furosemide 40 mg BID   Thrombocytopenia: Platelet count of 114 on admission. Likely chronic as thrombocytopenia is listed  under her problem list. No signs of active bleeding at this time.  Atrial fibrillation: Chronic, taken off warfarin a few years ago due to falls and bleeding risk.   - Continue ASA 81 daily   HTN: Normotensive   - Continue home lisinopril 20 mg daily   CKD Stage 3: Creatinine at baseline (1.3) on admission   - Avoid nephrotoxic meds   FEN: NS 75 cc x 8 hours, replete lytes prn, heart healthy diet VTE ppx: SCDs Code Status: FULL   Dispo: Patient is stable enough to be discharged from the hospital today.  This is a Careers information officer Note.  The care of the patient was discussed with Dr. Evette Doffing and the assessment and plan formulated with their assistance.  Please see their attached note for official documentation of the daily encounter.   LOS: 0 days   Jethro Bolus, Medical Student 12/29/2015, 10:31 AM

## 2015-12-29 NOTE — Evaluation (Signed)
Physical Therapy Evaluation Patient Details Name: [name] MRN: [id number] DOB: 1925/05/20 Today's Date: 12/29/2015   History of Present Illness  82 yo female s/p fall at home striking head. UTI (+)   Clinical Impression  Pt admitted with above diagnosis. Pt currently with functional limitations due to the deficits listed below (see PT Problem List).Pt was able to ambulate without LOB with min challenges to balance with cane.  Pt appears to be at baseline.  Will benefit from [place] safety eval,HHOT and HHaide.  Will follow acutely.  Pt will benefit from skilled PT to increase their independence and safety with mobility to allow discharge to the venue listed below.      Follow Up Recommendations Home health PT;Supervision/Assistance - 24 hour (safety eval at home)    Equipment Recommendations  None recommended by PT (pt declines cane) pt does state she would like a shower chair- HHOT to assess?   Recommendations for Other Services       Precautions / Restrictions Precautions Precautions: Fall Precaution Comments: watch BP during session Restrictions Weight Bearing Restrictions: No      Mobility  Bed Mobility Overal bed mobility: Needs Assistance Bed Mobility: Supine to Sit     Supine to sit: Supervision     General bed mobility comments: Pt in chair on arrival.  Transfers Overall transfer level: Needs assistance Equipment used: Straight cane Transfers: Sit to/from Stand Sit to Stand: Supervision            Ambulation/Gait Ambulation/Gait assistance: Supervision Ambulation Distance (Feet): 150 Feet Assistive device: Straight cane Gait Pattern/deviations: Step-through pattern;Decreased stride length;Trunk flexed   Gait velocity interpretation: Below normal speed for age/gender General Gait Details: Pt appears to be close to baseline.  Pt uses cane and occasionally reaches for furniture.  Pt given min challenges without LOB. Cannot withstand mod  challenges to balance.    Stairs            Wheelchair Mobility    Modified Rankin (Stroke Patients Only)       Balance Overall balance assessment: Needs assistance Sitting-balance support: Bilateral upper extremity supported;Feet supported Sitting balance-Leahy Scale: Fair     Standing balance support: Single extremity supported;During functional activity Standing balance-Leahy Scale: Fair Standing balance comment: Pt was able to stand at sink and wash hands with supervision.              High level balance activites: Direction changes;Turns;Sudden stops High Level Balance Comments: All of above with supervision with cane.             Pertinent Vitals/Pain Pain Assessment: Faces Faces Pain Scale: Hurts a little bit Pain Location: head Pain Descriptors / Indicators: Headache Pain Intervention(s): Limited activity within patient's tolerance;Monitored during session;Repositioned  BP at end of walk 96/56 with no c/o dizziness    Home Living Family/patient expects to be discharged to:: Private residence Living Arrangements: Alone Available Help at Discharge: Family;Available PRN/intermittently Type of Home: House Home Access: Level entry     Home Layout: One level Home Equipment: Cane - single point;Bedside commode Additional Comments: pt reports she has 8 children and a daughter lives close by. pt reports she does not drive and that children get all the groceries and drive her to appointments. pt does all her household chores and cooks all her meals    Prior Function Level of Independence: Independent         Comments: children come by daily and wash pts clothes, clean  house.     Hand Dominance   Dominant Hand: Right    Extremity/Trunk Assessment   Upper Extremity Assessment: Defer to OT evaluation           Lower Extremity Assessment: Generalized weakness RLE Deficits / Details: reports "i aint having no surgery. i aint seen him yet but  they aint cutting on me" Pt with pending appointment to address R knee pain    Cervical / Trunk Assessment: Kyphotic  Communication   Communication: HOH  Cognition Arousal/Alertness: Awake/alert Behavior During Therapy: Impulsive Overall Cognitive Status: Impaired/Different from baseline Area of Impairment: Attention;Orientation;Memory;Following commands;Safety/judgement;Awareness;Problem solving Orientation Level: Disoriented to;Time;Place Current Attention Level: Sustained Memory: Decreased short-term memory Following Commands: Follows multi-step commands inconsistently Safety/Judgement: Decreased awareness of safety;Decreased awareness of deficits Awareness: Intellectual Problem Solving: Slow processing;Difficulty sequencing General Comments: Pt reports "i woke up this morning thanked the lord for my home and said lord it is good to be home and then looked around and said no I aint home. i told my children and they said mama you crazy" pt little tangential at times with speech.  OT was able to cross completely into midline of vision stating patients name and hello prior to patients awareness of therapist presence. Pt states "OH hey I was looking up here at this " Pt showing visual changes and lack of awareness to visual input along with cognitive changes    General Comments      Exercises        Assessment/Plan    PT Assessment Patient needs continued PT services  PT Diagnosis Generalized weakness   PT Problem List Decreased activity tolerance;Decreased balance;Decreased mobility;Decreased knowledge of use of DME;Decreased safety awareness;Decreased knowledge of precautions  PT Treatment Interventions DME instruction;Gait training;Functional mobility training;Therapeutic activities;Therapeutic exercise;Balance training;Patient/family education   PT Goals (Current goals can be found in the Care Plan section) Acute Rehab PT Goals Patient Stated Goal: to gohome PT Goal  Formulation: With patient Time For Goal Achievement: 01/12/16 Potential to Achieve Goals: Good    Frequency Min 3X/week   Barriers to discharge Decreased caregiver support      Co-evaluation               End of Session Equipment Utilized During Treatment: Gait belt Activity Tolerance: Patient limited by fatigue Patient left: in chair;with call bell/phone within reach;with chair alarm set;with SCD's reapplied Nurse Communication: Mobility status    Functional Assessment Tool Used: clinical judgment Functional Limitation: Mobility: Walking and moving around Mobility: Walking and Moving Around Current Status [phone]): At least 1 percent but less than 20 percent impaired, limited or restricted Mobility: Walking and Moving Around Goal Status [phone]): At least 1 percent but less than 20 percent impaired, limited or restricted    Time: [id number] PT Time Calculation (min) (ACUTE ONLY): 26 min   Charges:   PT Evaluation $PT Eval Moderate Complexity: 1 Procedure PT Treatments $Gait Training: 8-22 mins   PT G Codes:   PT G-Codes **NOT FOR INPATIENT CLASS** Functional Assessment Tool Used: clinical judgment Functional Limitation: Mobility: Walking and moving around Mobility: Walking and Moving Around Current Status [id number]): At least 1 percent but less than 20 percent impaired, limited or restricted Mobility: Walking and Moving Around Goal Status [phone]): At least 1 percent but less than 20 percent impaired, limited or restricted    Irwin Brakeman F 12/29/2015, 9:48 AM  [facility] Acute Rehabilitation [phone] (pager)

## 2015-12-29 NOTE — Progress Notes (Signed)
OT NOTE  Pt demonstrates orthostatic BP without awareness  Orthostatic BPs  Supine 139/79  Sitting 134/73     Standing 130/70  Standing after 5 min and ambulation ~25 ft 103/55    Pt at risk for falling and lives alone with cognitive deficits noted on evaluation. OT evaluation to follow with detail.  Jeri Modena   OTR/L Pager: [phone] Office: [phone] .

## 2015-12-30 ENCOUNTER — Other Ambulatory Visit: Payer: Self-pay | Admitting: *Deleted

## 2015-12-30 NOTE — Progress Notes (Signed)
This encounter was created in error - please disregard.

## 2015-12-30 NOTE — Patient Outreach (Signed)
[facility] [facility]) Care Management  12/30/2015  [name] 10/05/1925 [id number]   Referral received from hospital liaison for transition of care follow up and assessment of needs once member discharged from hospital.  She was admitted for observation on 9/10 after syncope resulting in a fall, discharged on 9/12.  According to chart, she has a history of heart failure, Hypertension, controlled A-fib, GERD, and stroke.    Call placed to listed preferred number, [phone], voice mail picks up but unable to leave message before phone was disconnected.  This member was referred to [facility] previously, closed due to inability to contact.  Will make second attempt to contact tomorrow.   [name], [place], MSN [place] [phone]

## 2015-12-31 ENCOUNTER — Other Ambulatory Visit: Payer: Self-pay | Admitting: *Deleted

## 2015-12-31 DIAGNOSIS — I130 Hypertensive heart and chronic kidney disease with heart failure and stage 1 through stage 4 chronic kidney disease, or unspecified chronic kidney disease: Secondary | ICD-10-CM | POA: Diagnosis not present

## 2015-12-31 DIAGNOSIS — K5790 Diverticulosis of intestine, part unspecified, without perforation or abscess without bleeding: Secondary | ICD-10-CM | POA: Diagnosis not present

## 2015-12-31 DIAGNOSIS — I5032 Chronic diastolic (congestive) heart failure: Secondary | ICD-10-CM | POA: Diagnosis not present

## 2015-12-31 DIAGNOSIS — D649 Anemia, unspecified: Secondary | ICD-10-CM | POA: Diagnosis not present

## 2015-12-31 DIAGNOSIS — M810 Age-related osteoporosis without current pathological fracture: Secondary | ICD-10-CM | POA: Diagnosis not present

## 2015-12-31 DIAGNOSIS — N183 Chronic kidney disease, stage 3 (moderate): Secondary | ICD-10-CM | POA: Diagnosis not present

## 2015-12-31 DIAGNOSIS — I4891 Unspecified atrial fibrillation: Secondary | ICD-10-CM | POA: Diagnosis not present

## 2015-12-31 DIAGNOSIS — R55 Syncope and collapse: Secondary | ICD-10-CM | POA: Diagnosis not present

## 2015-12-31 DIAGNOSIS — E1122 Type 2 diabetes mellitus with diabetic chronic kidney disease: Secondary | ICD-10-CM | POA: Diagnosis not present

## 2015-12-31 NOTE — Telephone Encounter (Signed)
Transition Care Management Follow-up Telephone Call   Date discharged? 12/29/15.   How have you been since you were released from the hospital? "she's fine"   Do you understand why you were in the hospital? Yes, talked to pt's daughter.   Do you understand the discharge instructions? yes   Where were you discharged to? home   Items Reviewed:  Medications reviewed: yes  Allergies reviewed  n/a  Dietary changes reviewed: n/a Referrals reviewed:   Functional Questionnaire:   Activities of Daily Living (ADLs):   She states they are independent in the following: states her other sister has everything set up States they require assistance with the following: nurse visits.   Any transportation issues/concerns?: no   Any patient concerns? no  Confirmed importance and date/time of follow-up visits scheduled 9/18 [facility]  Confirmed with patient if condition begins to worsen call PCP or go to the ER.  Patient was given the office number and encouraged to call back with question or concerns.  :yes

## 2015-12-31 NOTE — Patient Outreach (Addendum)
[place]) Care Management  12/31/2015  LIBNY BLADEN 01/15/1926 [id number]   2nd attempt made to contact member for transition of care.  Daughter, Shauna Hugh, answers phone and verifies identity, but request this care manager contact daughter who is the POA and listed as emergency contact, [name].  Call placed to [facility].  She verifies member's identity.  [facility] care management services explained.  She state that the member has been doing well since discharge.  She state that she does continue to live alone, but that she and her sisters take turns spending the night with member.  [name] state that she manages member's medications, filling her pill box weekly.  She denies questions regarding discharge instructions, verbalizes understanding of low sodium diet.  Patient was recently discharged from hospital and all medications have been reviewed.  She also report that she monitors the member's blood pressure and weight daily, today's reading being 123 pounds, blood pressure 134/76, oxygen level 98%  [name] agrees to weekly transition of care calls and initial home visit.  She prefers the home visit when she is available, so visit scheduled for next month as she will be leaving to go out of town next week.  She does agree to calls to her sisters while she is out of town.  She denies needing personal care assistance for member as she has family that are involved with care.  Member does have home health nursing and PT, first visit today.  Daughter denies any questions at this time, will follow up next week with weekly transition of care call.  [name], [place], MSN [place] [phone]

## 2016-01-01 LAB — URINE CULTURE

## 2016-01-04 ENCOUNTER — Ambulatory Visit (INDEPENDENT_AMBULATORY_CARE_PROVIDER_SITE_OTHER): Payer: Commercial Managed Care - HMO | Admitting: Internal Medicine

## 2016-01-04 VITALS — BP 114/56 | HR 65 | Temp 98.4°F | Ht 60.0 in | Wt 128.0 lb

## 2016-01-04 DIAGNOSIS — R159 Full incontinence of feces: Secondary | ICD-10-CM

## 2016-01-04 DIAGNOSIS — K6289 Other specified diseases of anus and rectum: Secondary | ICD-10-CM

## 2016-01-04 DIAGNOSIS — D696 Thrombocytopenia, unspecified: Secondary | ICD-10-CM | POA: Diagnosis not present

## 2016-01-04 DIAGNOSIS — I5032 Chronic diastolic (congestive) heart failure: Secondary | ICD-10-CM

## 2016-01-04 DIAGNOSIS — N39490 Overflow incontinence: Secondary | ICD-10-CM

## 2016-01-04 DIAGNOSIS — R55 Syncope and collapse: Secondary | ICD-10-CM | POA: Diagnosis not present

## 2016-01-04 DIAGNOSIS — N393 Stress incontinence (female) (male): Secondary | ICD-10-CM

## 2016-01-04 DIAGNOSIS — Z09 Encounter for follow-up examination after completed treatment for conditions other than malignant neoplasm: Secondary | ICD-10-CM

## 2016-01-04 DIAGNOSIS — Z87440 Personal history of urinary (tract) infections: Secondary | ICD-10-CM | POA: Diagnosis not present

## 2016-01-04 MED ORDER — FUROSEMIDE 20 MG PO TABS: ORAL_TABLET | ORAL | 3 refills | Status: DC

## 2016-01-04 NOTE — Assessment & Plan Note (Signed)
A: At recent hospital discharge, it was recommended that patient consider discontinuing Vesicare due to some anti-cholinergic effects to which she and her daughter are agreeable.  P: - trial off Vesicare - follow up with PCP in 9 days.

## 2016-01-04 NOTE — Progress Notes (Signed)
error 

## 2016-01-04 NOTE — Assessment & Plan Note (Signed)
A: At hospital discharge, her Lasix dose was decreased from 60mg  QAM and 40mg  QPM to 40mg  BID.  Since discharge she has experienced increased lower extremity swelling.  She has also experienced a 6 pound weight gain during this time.  She has not had worsening SOB with exertion during this time.  She has been adherent to her Lasix during this time.  It appears in the past she has been treated for increased volume by titrating her Lasix dose and this has been tolerated well.  P: - we will increase her Lasix back to the prior dose - 60mg  in the morning and 40mg  in the evening. - we briefly discussed good fluid restriction and salt intake. - patient will see her PCP for follow up on Sept 27 to reassess how she is doing with her daily weights and fluid status.

## 2016-01-04 NOTE — Assessment & Plan Note (Signed)
A: Chronic and stable problem, but platelets were noted to be a little lower than normal in her hospitalization.  No signs of bleeding.  P: - check CBC

## 2016-01-04 NOTE — Assessment & Plan Note (Signed)
  Patient recently discharged from hospital with syncopal event.  She has history of chronic heart failure with reduced EF and atrial fibrillation. A repeat Echo was stable without new valvular or structural heart disease. Electrophysiology service evaluated the patient's pacemaker which did not show any unstable arrhythmias around the time of the syncopal event.  Consideration given to etiology being related to her urinary tract infection.

## 2016-01-04 NOTE — Assessment & Plan Note (Signed)
A:  Patient treated for UTI during hospitalization with culture growing Coag negative Staph aureus.  She was discharged home on Bactrim.  She has completed this and is not having any symptoms of dysuria or abdominal pain today.  I discussed with microbiology lab who stated that her organism was sensitive to Bactrim.  P: - remove Bactrim from medication list

## 2016-01-04 NOTE — Progress Notes (Signed)
CC: here for hospital follow up for syncope and UTI  HPI:  [name] is a 82 y.o. woman with a past medical history listed below here today for follow up of her recent hospitalization for syncope and UTI.  For details of today's visit and the status of her chronic medical issues please refer to the assessment and plan.   Past Medical History:  Diagnosis Date  . Adenomatous colon polyp    tubular  . Allergic rhinitis   . Anemia    Iron deficiency, does not tolerate po  . Arthritis    "all over my body"  . Atrial fibrillation (HCC)    on coumadin followed by Dr. Elie Confer  . CHF (congestive heart failure) (HCC)    diastolic dysfunction  . Constipation   . Diverticulosis of colon (without mention of hemorrhage)   . DJD (degenerative joint disease)   . GERD (gastroesophageal reflux disease)   . History of sick sinus syndrome s/p pacemaker Dr. Lovena Le 2000  . History of trichomonal urethritis   . History of vaginal bleeding 05/2005   nagative endometrial biopsy  . Hypertension    well controlled on 3 agents  . Incontinence overflow, stress female    on vesicare  . Onychomycosis   . Osteoporosis   . Pacemaker   . Restrictive lung disease 1996   PFT's showed mild disease  . Stroke [facility]) 2002   R MCA, cardioembolic  . Type II diabetes mellitus (HCC)    diet controlled  . Weight loss, unintentional 2010-2011   8/08-8/10 138-146 lbs, between 8/10-10/11 lost 18 lbs., work-up negative    Review of Systems:   Please see pertinent ROS reviewed in HPI and problem based charting.   Physical Exam:  Vitals:   01/04/16 1410  BP: (!) 114/56  Pulse: 65  Temp: 98.4 F (36.9 C)  TempSrc: Oral  SpO2: 100%  Weight: 128 lb (58.1 kg)  Height: 5' (1.524 m)   Physical Exam  Constitutional:  Elderly, frail, AA woman, sitting in wheelchair.  HENT:  Head: Normocephalic and atraumatic.  Eyes: EOM are normal.  Cardiovascular: Normal rate and regular rhythm.     Pulmonary/Chest: Effort normal and breath sounds normal.  Abdominal: Soft. She exhibits no distension. There is no tenderness.  Neurological: She is alert.  Skin: Skin is warm and dry.  Psychiatric: She has a normal mood and affect.     Assessment & Plan:   See Encounters Tab for problem based charting.  Patient discussed with Dr. Daryll Drown.  Syncope  Patient recently discharged from hospital with syncopal event.  She has history of chronic heart failure with reduced EF and atrial fibrillation. A repeat Echo was stable without new valvular or structural heart disease. Electrophysiology service evaluated the patient's pacemaker which did not show any unstable arrhythmias around the time of the syncopal event.  Consideration given to etiology being related to her urinary tract infection.   Chronic diastolic heart failure (HCC) A: At hospital discharge, her Lasix dose was decreased from 60mg  QAM and 40mg  QPM to 40mg  BID.  Since discharge she has experienced increased lower extremity swelling.  She has also experienced a 6 pound weight gain during this time.  She has not had worsening SOB with exertion during this time.  She has been adherent to her Lasix during this time.  It appears in the past she has been treated for increased volume by titrating her Lasix dose and this has been tolerated well.  P: - we will increase her Lasix back to the prior dose - 60mg  in the morning and 40mg  in the evening. - we briefly discussed good fluid restriction and salt intake. - patient will see her PCP for follow up on Sept 27 to reassess how she is doing with her daily weights and fluid status.  Incontinence overflow, stress female A: At recent hospital discharge, it was recommended that patient consider discontinuing Vesicare due to some anti-cholinergic effects to which she and her daughter are agreeable.  P: - trial off Vesicare - follow up with PCP in 9 days.  Thrombocytopenia A: Chronic and  stable problem, but platelets were noted to be a little lower than normal in her hospitalization.  No signs of bleeding.  P: - check CBC  Infection of urinary tract A:  Patient treated for UTI during hospitalization with culture growing Coag negative Staph aureus.  She was discharged home on Bactrim.  She has completed this and is not having any symptoms of dysuria or abdominal pain today.  I discussed with microbiology lab who stated that her organism was sensitive to Bactrim.  P: - remove Bactrim from medication list  Fecal incontinence due to anorectal disorder A: Longstanding issue that has been worked up in the past.  She has been evaluated by GI who recommended diet alteration, loperamide as needed, and strengthening exercises.  Second referral for GI has been considered with request for manometry but this has been held off for now. Spinal imaging has been considered as well but deferred unless symptoms were to worsen which they do not appear to have at this time.  P: - continued recommendation for strengthening exercises, loperamide as needed for loose stools.

## 2016-01-04 NOTE — Assessment & Plan Note (Signed)
A: Longstanding issue that has been worked up in the past.  She has been evaluated by GI who recommended diet alteration, loperamide as needed, and strengthening exercises.  Second referral for GI has been considered with request for manometry but this has been held off for now. Spinal imaging has been considered as well but deferred unless symptoms were to worsen which they do not appear to have at this time.  P: - continued recommendation for strengthening exercises, loperamide as needed for loose stools.

## 2016-01-04 NOTE — Patient Instructions (Addendum)
Please go back to your old dose of Lasix - 3 tablets in the morning and 2 tablets in the evening.  Keep an eye on the diarrhea for the next few days with the recent antibiotics.  If you get much worse and have frequent diarrhea or have abdominal pain or fevers, please call us right away to be seen.  Otherwise, for loose stools occasionally you can use Imodium occasionally as needed.  Try to see how you do without taking Vesicare.  Please see Dr. Daryll Drown on Sept 27 at 10:45

## 2016-01-05 DIAGNOSIS — D649 Anemia, unspecified: Secondary | ICD-10-CM | POA: Diagnosis not present

## 2016-01-05 DIAGNOSIS — M810 Age-related osteoporosis without current pathological fracture: Secondary | ICD-10-CM | POA: Diagnosis not present

## 2016-01-05 DIAGNOSIS — K5790 Diverticulosis of intestine, part unspecified, without perforation or abscess without bleeding: Secondary | ICD-10-CM | POA: Diagnosis not present

## 2016-01-05 DIAGNOSIS — I130 Hypertensive heart and chronic kidney disease with heart failure and stage 1 through stage 4 chronic kidney disease, or unspecified chronic kidney disease: Secondary | ICD-10-CM | POA: Diagnosis not present

## 2016-01-05 DIAGNOSIS — I5032 Chronic diastolic (congestive) heart failure: Secondary | ICD-10-CM | POA: Diagnosis not present

## 2016-01-05 DIAGNOSIS — N183 Chronic kidney disease, stage 3 (moderate): Secondary | ICD-10-CM | POA: Diagnosis not present

## 2016-01-05 DIAGNOSIS — I4891 Unspecified atrial fibrillation: Secondary | ICD-10-CM | POA: Diagnosis not present

## 2016-01-05 DIAGNOSIS — R55 Syncope and collapse: Secondary | ICD-10-CM | POA: Diagnosis not present

## 2016-01-05 DIAGNOSIS — E1122 Type 2 diabetes mellitus with diabetic chronic kidney disease: Secondary | ICD-10-CM | POA: Diagnosis not present

## 2016-01-05 LAB — CBC
HEMATOCRIT: 32.1 % — AB (ref 34.0–46.6)
HEMOGLOBIN: 10.5 g/dL — AB (ref 11.1–15.9)
MCH: 30.9 pg (ref 26.6–33.0)
MCHC: 32.7 g/dL (ref 31.5–35.7)
MCV: 94 fL (ref 79–97)
NRBC: 1 % — ABNORMAL HIGH (ref 0–0)
Platelets: 152 10*3/uL (ref 150–379)
RBC: 3.4 x10E6/uL — AB (ref 3.77–5.28)
RDW: 16.8 % — ABNORMAL HIGH (ref 12.3–15.4)
WBC: 3.8 10*3/uL (ref 3.4–10.8)

## 2016-01-06 DIAGNOSIS — K5790 Diverticulosis of intestine, part unspecified, without perforation or abscess without bleeding: Secondary | ICD-10-CM | POA: Diagnosis not present

## 2016-01-06 DIAGNOSIS — I130 Hypertensive heart and chronic kidney disease with heart failure and stage 1 through stage 4 chronic kidney disease, or unspecified chronic kidney disease: Secondary | ICD-10-CM | POA: Diagnosis not present

## 2016-01-06 DIAGNOSIS — D649 Anemia, unspecified: Secondary | ICD-10-CM | POA: Diagnosis not present

## 2016-01-06 DIAGNOSIS — N183 Chronic kidney disease, stage 3 (moderate): Secondary | ICD-10-CM | POA: Diagnosis not present

## 2016-01-06 DIAGNOSIS — M810 Age-related osteoporosis without current pathological fracture: Secondary | ICD-10-CM | POA: Diagnosis not present

## 2016-01-06 DIAGNOSIS — R55 Syncope and collapse: Secondary | ICD-10-CM | POA: Diagnosis not present

## 2016-01-06 DIAGNOSIS — I5032 Chronic diastolic (congestive) heart failure: Secondary | ICD-10-CM | POA: Diagnosis not present

## 2016-01-06 DIAGNOSIS — I4891 Unspecified atrial fibrillation: Secondary | ICD-10-CM | POA: Diagnosis not present

## 2016-01-06 DIAGNOSIS — E1122 Type 2 diabetes mellitus with diabetic chronic kidney disease: Secondary | ICD-10-CM | POA: Diagnosis not present

## 2016-01-07 ENCOUNTER — Other Ambulatory Visit: Payer: Self-pay | Admitting: *Deleted

## 2016-01-07 DIAGNOSIS — R55 Syncope and collapse: Secondary | ICD-10-CM | POA: Diagnosis not present

## 2016-01-07 DIAGNOSIS — M810 Age-related osteoporosis without current pathological fracture: Secondary | ICD-10-CM | POA: Diagnosis not present

## 2016-01-07 DIAGNOSIS — K5790 Diverticulosis of intestine, part unspecified, without perforation or abscess without bleeding: Secondary | ICD-10-CM | POA: Diagnosis not present

## 2016-01-07 DIAGNOSIS — I4891 Unspecified atrial fibrillation: Secondary | ICD-10-CM | POA: Diagnosis not present

## 2016-01-07 DIAGNOSIS — D649 Anemia, unspecified: Secondary | ICD-10-CM | POA: Diagnosis not present

## 2016-01-07 DIAGNOSIS — I5032 Chronic diastolic (congestive) heart failure: Secondary | ICD-10-CM | POA: Diagnosis not present

## 2016-01-07 DIAGNOSIS — N183 Chronic kidney disease, stage 3 (moderate): Secondary | ICD-10-CM | POA: Diagnosis not present

## 2016-01-07 DIAGNOSIS — E1122 Type 2 diabetes mellitus with diabetic chronic kidney disease: Secondary | ICD-10-CM | POA: Diagnosis not present

## 2016-01-07 DIAGNOSIS — I130 Hypertensive heart and chronic kidney disease with heart failure and stage 1 through stage 4 chronic kidney disease, or unspecified chronic kidney disease: Secondary | ICD-10-CM | POA: Diagnosis not present

## 2016-01-07 NOTE — Patient Outreach (Signed)
[name]  01/07/2016  [name] 08/16/1925 [id number]   Weekly transition of care call placed to member's daughter, [name], per her request.  [name] reports that the member continues to progress and is doing "very well."  She state that the member completed her follow up appointment, Lasix was increased to 3 tablets every am and 2 tablets every pm.  Her weight has decreased from 128 pounds to 126 pounds.  She has another follow up next week.  Daughter denies any questions regarding heart failure zones or education on diet/daily weights.  [name] will be out of town next week, but will follow up with member next week.  [name], [place], MSN [place] [phone]

## 2016-01-12 ENCOUNTER — Telehealth: Payer: Self-pay | Admitting: Internal Medicine

## 2016-01-12 DIAGNOSIS — I130 Hypertensive heart and chronic kidney disease with heart failure and stage 1 through stage 4 chronic kidney disease, or unspecified chronic kidney disease: Secondary | ICD-10-CM | POA: Diagnosis not present

## 2016-01-12 DIAGNOSIS — N183 Chronic kidney disease, stage 3 (moderate): Secondary | ICD-10-CM | POA: Diagnosis not present

## 2016-01-12 DIAGNOSIS — I4891 Unspecified atrial fibrillation: Secondary | ICD-10-CM | POA: Diagnosis not present

## 2016-01-12 DIAGNOSIS — E1122 Type 2 diabetes mellitus with diabetic chronic kidney disease: Secondary | ICD-10-CM | POA: Diagnosis not present

## 2016-01-12 DIAGNOSIS — D649 Anemia, unspecified: Secondary | ICD-10-CM | POA: Diagnosis not present

## 2016-01-12 DIAGNOSIS — M810 Age-related osteoporosis without current pathological fracture: Secondary | ICD-10-CM | POA: Diagnosis not present

## 2016-01-12 DIAGNOSIS — I5032 Chronic diastolic (congestive) heart failure: Secondary | ICD-10-CM | POA: Diagnosis not present

## 2016-01-12 DIAGNOSIS — R55 Syncope and collapse: Secondary | ICD-10-CM | POA: Diagnosis not present

## 2016-01-12 DIAGNOSIS — K5790 Diverticulosis of intestine, part unspecified, without perforation or abscess without bleeding: Secondary | ICD-10-CM | POA: Diagnosis not present

## 2016-01-12 NOTE — Progress Notes (Signed)
Internal Medicine Clinic Attending  Case discussed with Dr. Wallace soon after the resident saw the patient.  We reviewed the resident's history and exam and pertinent patient test results.  I agree with the assessment, diagnosis, and plan of care documented in the resident's note. 

## 2016-01-12 NOTE — Telephone Encounter (Signed)
APT. REMINDER CALL, LMTCB °

## 2016-01-13 ENCOUNTER — Ambulatory Visit: Payer: Commercial Managed Care - HMO | Admitting: Internal Medicine

## 2016-01-13 DIAGNOSIS — N183 Chronic kidney disease, stage 3 (moderate): Secondary | ICD-10-CM | POA: Diagnosis not present

## 2016-01-13 DIAGNOSIS — I5032 Chronic diastolic (congestive) heart failure: Secondary | ICD-10-CM | POA: Diagnosis not present

## 2016-01-13 DIAGNOSIS — D649 Anemia, unspecified: Secondary | ICD-10-CM | POA: Diagnosis not present

## 2016-01-13 DIAGNOSIS — I4891 Unspecified atrial fibrillation: Secondary | ICD-10-CM | POA: Diagnosis not present

## 2016-01-13 DIAGNOSIS — M810 Age-related osteoporosis without current pathological fracture: Secondary | ICD-10-CM | POA: Diagnosis not present

## 2016-01-13 DIAGNOSIS — E1122 Type 2 diabetes mellitus with diabetic chronic kidney disease: Secondary | ICD-10-CM | POA: Diagnosis not present

## 2016-01-13 DIAGNOSIS — K5790 Diverticulosis of intestine, part unspecified, without perforation or abscess without bleeding: Secondary | ICD-10-CM | POA: Diagnosis not present

## 2016-01-13 DIAGNOSIS — I130 Hypertensive heart and chronic kidney disease with heart failure and stage 1 through stage 4 chronic kidney disease, or unspecified chronic kidney disease: Secondary | ICD-10-CM | POA: Diagnosis not present

## 2016-01-13 DIAGNOSIS — R55 Syncope and collapse: Secondary | ICD-10-CM | POA: Diagnosis not present

## 2016-01-14 ENCOUNTER — Other Ambulatory Visit: Payer: Self-pay | Admitting: *Deleted

## 2016-01-14 NOTE — Patient Outreach (Signed)
[facility]) Care Management  01/14/2016  ELENER MUNUZ 05-12-1925 [id number]   Weekly transition of care call placed to member, as instructed by POA, [name] as she is out of town this week.  Member report that she is doing "a lot better."  She reports that she is unable to talk long because she has somewhere to be.  She denies shortness of breath or chest discomfort, reports compliance with medications.  She state that another daughter is caring for her this week while [name] is out of town. Initial home visit confirmed for next week, member denies questions at this time.  [name], [place], MSN [place] [phone]

## 2016-01-15 DIAGNOSIS — R55 Syncope and collapse: Secondary | ICD-10-CM | POA: Diagnosis not present

## 2016-01-15 DIAGNOSIS — I4891 Unspecified atrial fibrillation: Secondary | ICD-10-CM | POA: Diagnosis not present

## 2016-01-15 DIAGNOSIS — N183 Chronic kidney disease, stage 3 (moderate): Secondary | ICD-10-CM | POA: Diagnosis not present

## 2016-01-15 DIAGNOSIS — K5790 Diverticulosis of intestine, part unspecified, without perforation or abscess without bleeding: Secondary | ICD-10-CM | POA: Diagnosis not present

## 2016-01-15 DIAGNOSIS — I130 Hypertensive heart and chronic kidney disease with heart failure and stage 1 through stage 4 chronic kidney disease, or unspecified chronic kidney disease: Secondary | ICD-10-CM | POA: Diagnosis not present

## 2016-01-15 DIAGNOSIS — I5032 Chronic diastolic (congestive) heart failure: Secondary | ICD-10-CM | POA: Diagnosis not present

## 2016-01-15 DIAGNOSIS — D649 Anemia, unspecified: Secondary | ICD-10-CM | POA: Diagnosis not present

## 2016-01-15 DIAGNOSIS — E1122 Type 2 diabetes mellitus with diabetic chronic kidney disease: Secondary | ICD-10-CM | POA: Diagnosis not present

## 2016-01-15 DIAGNOSIS — M810 Age-related osteoporosis without current pathological fracture: Secondary | ICD-10-CM | POA: Diagnosis not present

## 2016-01-21 ENCOUNTER — Encounter: Payer: Self-pay | Admitting: *Deleted

## 2016-01-21 ENCOUNTER — Other Ambulatory Visit: Payer: Self-pay | Admitting: *Deleted

## 2016-01-21 DIAGNOSIS — I5032 Chronic diastolic (congestive) heart failure: Secondary | ICD-10-CM | POA: Diagnosis not present

## 2016-01-21 DIAGNOSIS — I4891 Unspecified atrial fibrillation: Secondary | ICD-10-CM | POA: Diagnosis not present

## 2016-01-21 DIAGNOSIS — E1122 Type 2 diabetes mellitus with diabetic chronic kidney disease: Secondary | ICD-10-CM | POA: Diagnosis not present

## 2016-01-21 DIAGNOSIS — R55 Syncope and collapse: Secondary | ICD-10-CM | POA: Diagnosis not present

## 2016-01-21 DIAGNOSIS — N183 Chronic kidney disease, stage 3 (moderate): Secondary | ICD-10-CM | POA: Diagnosis not present

## 2016-01-21 DIAGNOSIS — I130 Hypertensive heart and chronic kidney disease with heart failure and stage 1 through stage 4 chronic kidney disease, or unspecified chronic kidney disease: Secondary | ICD-10-CM | POA: Diagnosis not present

## 2016-01-21 DIAGNOSIS — K5790 Diverticulosis of intestine, part unspecified, without perforation or abscess without bleeding: Secondary | ICD-10-CM | POA: Diagnosis not present

## 2016-01-21 DIAGNOSIS — M810 Age-related osteoporosis without current pathological fracture: Secondary | ICD-10-CM | POA: Diagnosis not present

## 2016-01-21 DIAGNOSIS — D649 Anemia, unspecified: Secondary | ICD-10-CM | POA: Diagnosis not present

## 2016-01-21 NOTE — Patient Outreach (Signed)
[facility]) Care Management  01/21/2016  [name] 1925/10/09 [id number]   Arrived at [facility] home for scheduled home visit in which daughter [name] was to accompany member.  Member answers door, verifies identity, but report that [name] is not there and won't be available today.  She requests for this care manager to contact [name] to reschedule.  She does state that she has been feeling better, stating that she "try to keep it moving."  She denies any concerns at this time.  Call then placed to [facility], she state that she forgot the appointment today as she has 2 personal appointments.  She state that the member has "been doing very well."  They continue to monitor weight, denies weight gain, and continue work with home health nursing and PT.  She denies any urgent concerns, home visit rescheduled for next week.  [name], [place], MSN [place] [phone]

## 2016-01-21 NOTE — Progress Notes (Signed)
This encounter was created in error - please disregard.

## 2016-01-26 DIAGNOSIS — I4891 Unspecified atrial fibrillation: Secondary | ICD-10-CM | POA: Diagnosis not present

## 2016-01-26 DIAGNOSIS — I5032 Chronic diastolic (congestive) heart failure: Secondary | ICD-10-CM | POA: Diagnosis not present

## 2016-01-26 DIAGNOSIS — K5790 Diverticulosis of intestine, part unspecified, without perforation or abscess without bleeding: Secondary | ICD-10-CM | POA: Diagnosis not present

## 2016-01-26 DIAGNOSIS — I130 Hypertensive heart and chronic kidney disease with heart failure and stage 1 through stage 4 chronic kidney disease, or unspecified chronic kidney disease: Secondary | ICD-10-CM | POA: Diagnosis not present

## 2016-01-26 DIAGNOSIS — M810 Age-related osteoporosis without current pathological fracture: Secondary | ICD-10-CM | POA: Diagnosis not present

## 2016-01-26 DIAGNOSIS — E1122 Type 2 diabetes mellitus with diabetic chronic kidney disease: Secondary | ICD-10-CM | POA: Diagnosis not present

## 2016-01-26 DIAGNOSIS — R55 Syncope and collapse: Secondary | ICD-10-CM | POA: Diagnosis not present

## 2016-01-26 DIAGNOSIS — N183 Chronic kidney disease, stage 3 (moderate): Secondary | ICD-10-CM | POA: Diagnosis not present

## 2016-01-26 DIAGNOSIS — D649 Anemia, unspecified: Secondary | ICD-10-CM | POA: Diagnosis not present

## 2016-01-28 ENCOUNTER — Encounter: Payer: Self-pay | Admitting: *Deleted

## 2016-01-28 ENCOUNTER — Other Ambulatory Visit: Payer: Self-pay | Admitting: *Deleted

## 2016-01-28 DIAGNOSIS — N183 Chronic kidney disease, stage 3 (moderate): Secondary | ICD-10-CM | POA: Diagnosis not present

## 2016-01-28 DIAGNOSIS — M810 Age-related osteoporosis without current pathological fracture: Secondary | ICD-10-CM | POA: Diagnosis not present

## 2016-01-28 DIAGNOSIS — K5790 Diverticulosis of intestine, part unspecified, without perforation or abscess without bleeding: Secondary | ICD-10-CM | POA: Diagnosis not present

## 2016-01-28 DIAGNOSIS — E1122 Type 2 diabetes mellitus with diabetic chronic kidney disease: Secondary | ICD-10-CM | POA: Diagnosis not present

## 2016-01-28 DIAGNOSIS — I5032 Chronic diastolic (congestive) heart failure: Secondary | ICD-10-CM | POA: Diagnosis not present

## 2016-01-28 DIAGNOSIS — I130 Hypertensive heart and chronic kidney disease with heart failure and stage 1 through stage 4 chronic kidney disease, or unspecified chronic kidney disease: Secondary | ICD-10-CM | POA: Diagnosis not present

## 2016-01-28 DIAGNOSIS — I4891 Unspecified atrial fibrillation: Secondary | ICD-10-CM | POA: Diagnosis not present

## 2016-01-28 DIAGNOSIS — R55 Syncope and collapse: Secondary | ICD-10-CM | POA: Diagnosis not present

## 2016-01-28 DIAGNOSIS — D649 Anemia, unspecified: Secondary | ICD-10-CM | POA: Diagnosis not present

## 2016-01-28 NOTE — Patient Outreach (Signed)
[facility]) Care Management   01/28/2016  ZUZU BEFORT 04-02-1926 [id number]  ANDRENA MARGERUM is an 82 y.o. female  Subjective:   Member reports that she is "feeling good."  She denies any pain or discomfort, reports that she is compliant with her medications with the assistance of her daughter, [name].   Objective:   Review of Systems  Constitutional: Negative.   HENT: Negative.   Eyes: Negative.   Respiratory: Negative.   Cardiovascular: Positive for leg swelling.  Gastrointestinal: Negative.   Genitourinary: Negative.   Musculoskeletal: Negative.   Skin: Negative.   Neurological: Negative.   Endo/Heme/Allergies: Negative.   Psychiatric/Behavioral: Negative.     Physical Exam  Constitutional: She is oriented to person, place, and time. She appears well-developed and well-nourished.  Neck: Normal range of motion.  Cardiovascular: Normal rate, regular rhythm and normal heart sounds.   Respiratory: Effort normal and breath sounds normal.  GI: Soft. Bowel sounds are normal.  Musculoskeletal: Normal range of motion.  Neurological: She is alert and oriented to person, place, and time.  Skin: Skin is warm and dry.    BP 124/76 (BP Location: Left Arm, Patient Position: Sitting, Cuff Size: Normal)   Pulse 94   Resp 18   Ht 1.524 m (5')   Wt 120 lb (54.4 kg)   SpO2 98%   BMI 23.44 kg/m    Encounter Medications:   Outpatient Encounter Prescriptions as of 01/28/2016  Medication Sig  . aspirin EC 81 MG tablet Take 81 mg by mouth daily.  . diclofenac sodium (VOLTAREN) 1 % GEL Apply 2 g topically 4 (four) times daily. To left shoulder.  . furosemide (LASIX) 20 MG tablet Take 3 tabs ('60mg'$ ) every morning and 2 tabs ('40mg'$ ) every afternoon.  Marland Kitchen lisinopril (PRINIVIL,ZESTRIL) 20 MG tablet TAKE 1 TABLET BY MOUTH DAILY  . metoprolol (LOPRESSOR) 100 MG tablet TAKE 1 TABLET(100 MG) BY MOUTH TWICE DAILY  . Nutritional Supplements ([place])  LIQD Take 237 mLs by mouth 3 (three) times daily.  . potassium chloride 20 MEQ/15ML (10%) SOLN TAKE 15 MLS BY MOUTH DAILY   No facility-administered encounter medications on file as of 01/28/2016.     Functional Status:   In your present state of health, do you have any difficulty performing the following activities: 01/28/2016 12/28/2015  Hearing? Tempie Donning  Vision? Y Y  Difficulty concentrating or making decisions? Tempie Donning  Walking or climbing stairs? Y Y  Dressing or bathing? N Y  Doing errands, shopping? Tempie Donning  Preparing Food and eating ? N -  Using the Toilet? N -  In the past six months, have you accidently leaked urine? Y -  Do you have problems with loss of bowel control? Y -  Managing your Medications? Y -  Managing your Finances? N -  Housekeeping or managing your Housekeeping? N -  Some recent data might be hidden    Fall/Depression Screening:    PHQ 2/9 Scores 01/28/2016 12/17/2015 10/07/2015 06/26/2015 05/20/2015 03/04/2015 12/19/2014  PHQ - 2 Score 0 0 0 0 0 0 0   Fall Risk  01/28/2016 12/17/2015 10/07/2015 06/26/2015 05/20/2015  Falls in the past year? Yes No No No No  Number falls in past yr: 2 or more - - - -  Injury with Fall? Yes - - - -  Risk Factor Category  High Fall Risk - - - -  Risk for fall due to : History of fall(s) - - Medication  side effect;Impaired balance/gait -  Risk for fall due to (comments): - - - - -  Follow up Education provided;Falls prevention discussed - - - -    Assessment:    Met with member and daughter at scheduled time.  Consent already on file.  Member has multiple children that assist with her care, but [name] provides the most care and is the POA.  They are both aware of management of heart failure, denies needing any further education.  She state that she weigh herself daily, maintains weight around 120 pounds.  Provided member with [facility] calendar with heart failure zones and weight log included.  Member does have some swelling in her legs, state she  does elevate them when sitting.  Daughter state she purchased compressing socks, but member state she is unable to put them on independently.  Compression socks applied today, daughter state she will make sure she has them on daily to help comtrol swelling.  Member is able to provide meals and perform ADLs independently, continue to have home health PT involved for strengthening.  She denies any recent falls, but daughter report that she and her sister will be obtaining a medical alert for the member.  Both member and daughter are aware that her transition of care program is complete, they deny the need for further involvement with [facility], but does agree to contact [facility] in the further if needs change.  Plan:   Will notify care management assistant and PCP of case closure.  [facility] CM Care Plan Problem One   Flowsheet Row Most Recent Value  Care Plan Problem One  Risk for readmission related to heart failure as evidenced by recent hospitalization  Role Documenting the Problem One  Care Management [place] for Problem One  Active  [facility] Long Term Goal (31-90 days)  Member will not be readmitted to hospital within 31 days of discharge  [facility] Long Term Goal Start Date  12/31/15  [facility] Long Term Goal Met Date  01/28/16  Interventions for Problem One Long Term Goal  Discussed with daughter/caregiver importance of following discharge instructions (including involvement with home health and diet) as it relates directly to risk for readmission.  Educated member on discharge instructions, and importance of follow up.  Verifed with member home health is involved, educated member on the role home health plays in decreasing risk of readmission  [facility] CM Short Term Goal #1 (0-30 days)  Member will keep and attend follow up appointment with PCP next week  [facility] CM Short Term Goal #1 Start Date  12/31/15  [facility] CM Short Term Goal #1 Met Date  01/07/16  Interventions for Short Term Goal #1  Confirmed with daughter  that follow up appointment has been made, confirmed transportation.  Discussed the importance of keeping/attending appointment as it decreases risk of readmission  [facility] CM Short Term Goal #2 (0-30 days)  Caregiver will report checking and recording daily weights over the next 4 weeks  [facility] CM Short Term Goal #2 Start Date  12/31/15  [facility] CM Short Term Goal #2 Met Date  01/21/16  Interventions for Short Term Goal #2  Caregiver/daughter educated on heart failure zones, and importance of daily weights.  Educated using teachback method on when to notify MD for weight changes according to heart failure zones.     [name], [place], MSN [place] [phone]

## 2016-02-03 DIAGNOSIS — I130 Hypertensive heart and chronic kidney disease with heart failure and stage 1 through stage 4 chronic kidney disease, or unspecified chronic kidney disease: Secondary | ICD-10-CM | POA: Diagnosis not present

## 2016-02-03 DIAGNOSIS — M810 Age-related osteoporosis without current pathological fracture: Secondary | ICD-10-CM | POA: Diagnosis not present

## 2016-02-03 DIAGNOSIS — R55 Syncope and collapse: Secondary | ICD-10-CM | POA: Diagnosis not present

## 2016-02-03 DIAGNOSIS — E1122 Type 2 diabetes mellitus with diabetic chronic kidney disease: Secondary | ICD-10-CM | POA: Diagnosis not present

## 2016-02-03 DIAGNOSIS — N183 Chronic kidney disease, stage 3 (moderate): Secondary | ICD-10-CM | POA: Diagnosis not present

## 2016-02-03 DIAGNOSIS — I5032 Chronic diastolic (congestive) heart failure: Secondary | ICD-10-CM | POA: Diagnosis not present

## 2016-02-03 DIAGNOSIS — K5790 Diverticulosis of intestine, part unspecified, without perforation or abscess without bleeding: Secondary | ICD-10-CM | POA: Diagnosis not present

## 2016-02-03 DIAGNOSIS — I4891 Unspecified atrial fibrillation: Secondary | ICD-10-CM | POA: Diagnosis not present

## 2016-02-03 DIAGNOSIS — D649 Anemia, unspecified: Secondary | ICD-10-CM | POA: Diagnosis not present

## 2016-02-04 DIAGNOSIS — N183 Chronic kidney disease, stage 3 (moderate): Secondary | ICD-10-CM | POA: Diagnosis not present

## 2016-02-04 DIAGNOSIS — R55 Syncope and collapse: Secondary | ICD-10-CM | POA: Diagnosis not present

## 2016-02-04 DIAGNOSIS — D649 Anemia, unspecified: Secondary | ICD-10-CM | POA: Diagnosis not present

## 2016-02-04 DIAGNOSIS — K5790 Diverticulosis of intestine, part unspecified, without perforation or abscess without bleeding: Secondary | ICD-10-CM | POA: Diagnosis not present

## 2016-02-04 DIAGNOSIS — I5032 Chronic diastolic (congestive) heart failure: Secondary | ICD-10-CM | POA: Diagnosis not present

## 2016-02-04 DIAGNOSIS — M810 Age-related osteoporosis without current pathological fracture: Secondary | ICD-10-CM | POA: Diagnosis not present

## 2016-02-04 DIAGNOSIS — I130 Hypertensive heart and chronic kidney disease with heart failure and stage 1 through stage 4 chronic kidney disease, or unspecified chronic kidney disease: Secondary | ICD-10-CM | POA: Diagnosis not present

## 2016-02-04 DIAGNOSIS — I4891 Unspecified atrial fibrillation: Secondary | ICD-10-CM | POA: Diagnosis not present

## 2016-02-04 DIAGNOSIS — E1122 Type 2 diabetes mellitus with diabetic chronic kidney disease: Secondary | ICD-10-CM | POA: Diagnosis not present

## 2016-02-17 ENCOUNTER — Ambulatory Visit (INDEPENDENT_AMBULATORY_CARE_PROVIDER_SITE_OTHER): Payer: Commercial Managed Care - HMO | Admitting: Internal Medicine

## 2016-02-17 VITALS — BP 133/67 | HR 63 | Temp 97.6°F | Ht 60.0 in | Wt 122.2 lb

## 2016-02-17 DIAGNOSIS — N39490 Overflow incontinence: Secondary | ICD-10-CM

## 2016-02-17 DIAGNOSIS — Z5189 Encounter for other specified aftercare: Secondary | ICD-10-CM

## 2016-02-17 DIAGNOSIS — I5032 Chronic diastolic (congestive) heart failure: Secondary | ICD-10-CM

## 2016-02-17 DIAGNOSIS — N393 Stress incontinence (female) (male): Secondary | ICD-10-CM

## 2016-02-17 DIAGNOSIS — J309 Allergic rhinitis, unspecified: Secondary | ICD-10-CM

## 2016-02-17 DIAGNOSIS — J301 Allergic rhinitis due to pollen: Secondary | ICD-10-CM

## 2016-02-17 DIAGNOSIS — H9193 Unspecified hearing loss, bilateral: Secondary | ICD-10-CM | POA: Diagnosis not present

## 2016-02-17 NOTE — Patient Instructions (Addendum)
It was a pleasure to meet you today [name]  Please continue taking your lasix 60 mg in the morning and 40 mg in the evening.  Keep taking your weight regularly and call us if there is a sudden increase in your weight or difficulty breathing.   Please schedule for a follow up appointment with your primary care doctor in 3 months

## 2016-02-17 NOTE — Progress Notes (Signed)
CC: follow up for CHF, having a runny nose  HPI: [name] is a 82 y.o. with past medical history as outlined below who presents to clinic for follow up of CHF and nasal congestion. She was recently hospitalized for syncope and had her home lasix 60 mg is the morning and 40 mg in the evening decreased to lasix 40 mg bid at discharge. After discharge she had increased swelling and weight gain so her original dose of lasix was restarted. She is in acute care clinic today for follow up of her volume status. She denies new weight gain. Her daughter keeps a log of her weight at home and her weights range between 118-122 lbs. She has a cough related to her chronic allergies but it is not productive, she denies wheeze. She forgets to take her Lasix about once per week. She drinks 3 bottles of water per day and only sprinkles little bit of salt on her meals. She eats fruits and vegetables and avoids fast food and frozen foods. On a good day she has shortness of breath over the distance of walking from the front door of her house to the car. She has also had cough, runny nose, sneezing, and itchy eyes for the last 2 weeks. She says these are the symptoms that she usually experiences with her allergies. She denies fever, sore throat, headache, wheezing, chest pain, palpitations, or buring with urination.  Please see problem list for status of the pt's chronic medical problems.  Past Medical History:  Diagnosis Date  . Adenomatous colon polyp    tubular  . Allergic rhinitis   . Anemia    Iron deficiency, does not tolerate po  . Arthritis    "all over my body"  . Atrial fibrillation (HCC)    on coumadin followed by Dr. Elie Confer  . CHF (congestive heart failure) (HCC)    diastolic dysfunction  . Constipation   . Diverticulosis of colon (without mention of hemorrhage)   . DJD (degenerative joint disease)   . GERD (gastroesophageal reflux disease)   . History of sick sinus syndrome s/p pacemaker  Dr. Lovena Le 2000  . History of trichomonal urethritis   . History of vaginal bleeding 05/2005   nagative endometrial biopsy  . Hypertension    well controlled on 3 agents  . Incontinence overflow, stress female    on vesicare  . Onychomycosis   . Osteoporosis   . Pacemaker   . Restrictive lung disease 1996   PFT's showed mild disease  . Stroke [facility]) 2002   R MCA, cardioembolic  . Type II diabetes mellitus (HCC)    diet controlled  . Weight loss, unintentional 2010-2011   8/08-8/10 138-146 lbs, between 8/10-10/11 lost 18 lbs., work-up negative    Review of Systems:   Review of Systems  Constitutional: Negative for fever.  HENT: Positive for congestion. Negative for sore throat.   Respiratory:       Nonproductive cough   Cardiovascular: Positive for leg swelling. Negative for chest pain and orthopnea.  Genitourinary: Negative for dysuria.  Neurological: Negative for headaches.   Physical Exam:  Vitals:   02/17/16 0958  BP: 133/67  Pulse: 63  Temp: 97.6 F (36.4 C)  TempSrc: Oral  SpO2: 100%  Weight: 122 lb 3.2 oz (55.4 kg)  Height: 5' (1.524 m)   Physical Exam  Constitutional: No distress.  HENT:  Nose: Nose normal. No rhinorrhea.  Eyes: Pupils are equal, round, and reactive to light.  Neck: JVD present.  Cardiovascular: An irregularly irregular rhythm present.  No murmur heard. Pulmonary/Chest: Effort normal. No respiratory distress. She has no wheezes. She has no rales.  Abdominal: Soft. She exhibits no distension. There is no tenderness.    Assessment & Plan:   See Encounters Tab for problem based charting.   Patient seen with Dr. Beryle Beams

## 2016-02-18 NOTE — Assessment & Plan Note (Addendum)
She was recently hospitalized for syncope and had her home lasix 60 mg is the morning and 40 mg in the evening decreased to lasix 40 mg bid at discharge. After discharge she had increased swelling and weight gain so her original dose of lasix was restarted. She is in acute care clinic today for follow up of her volume status. She denies new weight gain. Her daughter keeps a log of her weight at home and her weights range between 118-122 lbs. She has a cough related to her chronic allergies but it is not productive, she denies wheeze. She forgets to take her Lasix about once per week. She drinks 3 bottles of water per day and only sprinkles little bit of salt on her meals. She eats fruits and vegetables and avoids fast food and frozen foods. On a good day she has shortness of breath over the distance of walking from the front door of her house to the car. In the clinic today her weight was 122, improved from 128 at her prior office visit 9/18. She has no shortness of breath at rest, has tortuous neck veins but no pulmonary crackles and trace bilateral pitting edema .   Continue lasix 60 mg AM, 40 mg PM  Encouraged to continue good fluid restriction and limiting salt intake   She was encouraged to keep recording her weight and call us if she has weight gain of greater than 2 pounds in 24 hours Follow-up with PCP in 3 months

## 2016-02-18 NOTE — Progress Notes (Signed)
Medicine attending: I personally interviewed and briefly examined this patient on the day of the patient visit and reviewed pertinent clinical ,laboratory, and radiographic data  with resident physician Dr. Ledell Noss and we discussed a management plan.

## 2016-02-18 NOTE — Assessment & Plan Note (Signed)
She has had cough, runny nose, sneezing, and itchy eyes for the last 2 weeks. She says these are the symptoms that she usually experiences with her allergies. She denies fever, sore throat, headache or wheezing. She has taken claritin in the past and it has not made her feel drowsy.   Advised to take claritin but watch for drowsiness

## 2016-02-18 NOTE — Assessment & Plan Note (Addendum)
She continues to experience incontinence.   DME order for adult briefs

## 2016-02-18 NOTE — Assessment & Plan Note (Addendum)
She continues to experience difficulty hearing. She asked for a referral to audiology for hearing aids. She has been evaluated in the past.   Placed referral to audiology

## 2016-02-24 DIAGNOSIS — I5032 Chronic diastolic (congestive) heart failure: Secondary | ICD-10-CM | POA: Diagnosis not present

## 2016-02-24 DIAGNOSIS — N183 Chronic kidney disease, stage 3 (moderate): Secondary | ICD-10-CM | POA: Diagnosis not present

## 2016-02-24 DIAGNOSIS — R55 Syncope and collapse: Secondary | ICD-10-CM | POA: Diagnosis not present

## 2016-02-24 DIAGNOSIS — K5790 Diverticulosis of intestine, part unspecified, without perforation or abscess without bleeding: Secondary | ICD-10-CM | POA: Diagnosis not present

## 2016-02-24 DIAGNOSIS — E1122 Type 2 diabetes mellitus with diabetic chronic kidney disease: Secondary | ICD-10-CM | POA: Diagnosis not present

## 2016-02-24 DIAGNOSIS — I130 Hypertensive heart and chronic kidney disease with heart failure and stage 1 through stage 4 chronic kidney disease, or unspecified chronic kidney disease: Secondary | ICD-10-CM | POA: Diagnosis not present

## 2016-02-24 DIAGNOSIS — D649 Anemia, unspecified: Secondary | ICD-10-CM | POA: Diagnosis not present

## 2016-02-24 DIAGNOSIS — I4891 Unspecified atrial fibrillation: Secondary | ICD-10-CM | POA: Diagnosis not present

## 2016-02-24 DIAGNOSIS — M810 Age-related osteoporosis without current pathological fracture: Secondary | ICD-10-CM | POA: Diagnosis not present

## 2016-03-03 ENCOUNTER — Ambulatory Visit
Admission: RE | Admit: 2016-03-03 | Discharge: 2016-03-03 | Disposition: A | Discharge status: Home / Self Care | Payer: Commercial Managed Care - HMO | Source: Ambulatory Visit | Attending: Internal Medicine | Admitting: Internal Medicine

## 2016-03-03 DIAGNOSIS — Z1231 Encounter for screening mammogram for malignant neoplasm of breast: Secondary | ICD-10-CM | POA: Diagnosis not present

## 2016-03-16 ENCOUNTER — Emergency Department (HOSPITAL_COMMUNITY): Payer: Commercial Managed Care - HMO

## 2016-03-16 ENCOUNTER — Inpatient Hospital Stay (HOSPITAL_COMMUNITY)
Admission: EM | Admit: 2016-03-16 | Discharge: 2016-03-21 | DRG: 152 | Disposition: A | Discharge status: Home Health | Payer: Commercial Managed Care - HMO | Attending: Internal Medicine | Admitting: Internal Medicine

## 2016-03-16 ENCOUNTER — Encounter (HOSPITAL_COMMUNITY): Payer: Self-pay | Admitting: *Deleted

## 2016-03-16 DIAGNOSIS — N183 Chronic kidney disease, stage 3 unspecified: Secondary | ICD-10-CM | POA: Diagnosis present

## 2016-03-16 DIAGNOSIS — R5381 Other malaise: Secondary | ICD-10-CM

## 2016-03-16 DIAGNOSIS — J181 Lobar pneumonia, unspecified organism: Secondary | ICD-10-CM

## 2016-03-16 DIAGNOSIS — R059 Cough, unspecified: Secondary | ICD-10-CM

## 2016-03-16 DIAGNOSIS — I369 Nonrheumatic tricuspid valve disorder, unspecified: Secondary | ICD-10-CM | POA: Diagnosis not present

## 2016-03-16 DIAGNOSIS — Z7982 Long term (current) use of aspirin: Secondary | ICD-10-CM

## 2016-03-16 DIAGNOSIS — D696 Thrombocytopenia, unspecified: Secondary | ICD-10-CM | POA: Diagnosis not present

## 2016-03-16 DIAGNOSIS — M1990 Unspecified osteoarthritis, unspecified site: Secondary | ICD-10-CM | POA: Diagnosis present

## 2016-03-16 DIAGNOSIS — R05 Cough: Secondary | ICD-10-CM | POA: Diagnosis not present

## 2016-03-16 DIAGNOSIS — N179 Acute kidney failure, unspecified: Secondary | ICD-10-CM | POA: Diagnosis not present

## 2016-03-16 DIAGNOSIS — E1122 Type 2 diabetes mellitus with diabetic chronic kidney disease: Secondary | ICD-10-CM | POA: Diagnosis present

## 2016-03-16 DIAGNOSIS — Z833 Family history of diabetes mellitus: Secondary | ICD-10-CM

## 2016-03-16 DIAGNOSIS — J189 Pneumonia, unspecified organism: Secondary | ICD-10-CM | POA: Diagnosis present

## 2016-03-16 DIAGNOSIS — R5383 Other fatigue: Secondary | ICD-10-CM

## 2016-03-16 DIAGNOSIS — I110 Hypertensive heart disease with heart failure: Secondary | ICD-10-CM | POA: Diagnosis not present

## 2016-03-16 DIAGNOSIS — Z79899 Other long term (current) drug therapy: Secondary | ICD-10-CM

## 2016-03-16 DIAGNOSIS — R634 Abnormal weight loss: Secondary | ICD-10-CM | POA: Diagnosis present

## 2016-03-16 DIAGNOSIS — Z823 Family history of stroke: Secondary | ICD-10-CM

## 2016-03-16 DIAGNOSIS — I959 Hypotension, unspecified: Secondary | ICD-10-CM | POA: Diagnosis present

## 2016-03-16 DIAGNOSIS — I4891 Unspecified atrial fibrillation: Secondary | ICD-10-CM | POA: Diagnosis present

## 2016-03-16 DIAGNOSIS — K219 Gastro-esophageal reflux disease without esophagitis: Secondary | ICD-10-CM | POA: Diagnosis present

## 2016-03-16 DIAGNOSIS — J9601 Acute respiratory failure with hypoxia: Secondary | ICD-10-CM | POA: Diagnosis not present

## 2016-03-16 DIAGNOSIS — J810 Acute pulmonary edema: Secondary | ICD-10-CM | POA: Diagnosis not present

## 2016-03-16 DIAGNOSIS — A419 Sepsis, unspecified organism: Secondary | ICD-10-CM

## 2016-03-16 DIAGNOSIS — R630 Anorexia: Secondary | ICD-10-CM | POA: Diagnosis present

## 2016-03-16 DIAGNOSIS — I5022 Chronic systolic (congestive) heart failure: Secondary | ICD-10-CM

## 2016-03-16 DIAGNOSIS — F0390 Unspecified dementia without behavioral disturbance: Secondary | ICD-10-CM | POA: Diagnosis present

## 2016-03-16 DIAGNOSIS — Z8249 Family history of ischemic heart disease and other diseases of the circulatory system: Secondary | ICD-10-CM

## 2016-03-16 DIAGNOSIS — I2729 Other secondary pulmonary hypertension: Secondary | ICD-10-CM

## 2016-03-16 DIAGNOSIS — J123 Human metapneumovirus pneumonia: Secondary | ICD-10-CM | POA: Diagnosis not present

## 2016-03-16 DIAGNOSIS — B9789 Other viral agents as the cause of diseases classified elsewhere: Secondary | ICD-10-CM

## 2016-03-16 DIAGNOSIS — N182 Chronic kidney disease, stage 2 (mild): Secondary | ICD-10-CM

## 2016-03-16 DIAGNOSIS — E874 Mixed disorder of acid-base balance: Secondary | ICD-10-CM | POA: Diagnosis present

## 2016-03-16 DIAGNOSIS — R6520 Severe sepsis without septic shock: Secondary | ICD-10-CM | POA: Diagnosis not present

## 2016-03-16 DIAGNOSIS — R0602 Shortness of breath: Secondary | ICD-10-CM | POA: Diagnosis not present

## 2016-03-16 DIAGNOSIS — Z66 Do not resuscitate: Secondary | ICD-10-CM | POA: Diagnosis present

## 2016-03-16 DIAGNOSIS — J449 Chronic obstructive pulmonary disease, unspecified: Secondary | ICD-10-CM

## 2016-03-16 DIAGNOSIS — I10 Essential (primary) hypertension: Secondary | ICD-10-CM

## 2016-03-16 DIAGNOSIS — E875 Hyperkalemia: Secondary | ICD-10-CM | POA: Diagnosis present

## 2016-03-16 DIAGNOSIS — M810 Age-related osteoporosis without current pathological fracture: Secondary | ICD-10-CM | POA: Diagnosis present

## 2016-03-16 DIAGNOSIS — Z9981 Dependence on supplemental oxygen: Secondary | ICD-10-CM

## 2016-03-16 DIAGNOSIS — J069 Acute upper respiratory infection, unspecified: Secondary | ICD-10-CM | POA: Diagnosis present

## 2016-03-16 DIAGNOSIS — I509 Heart failure, unspecified: Secondary | ICD-10-CM | POA: Diagnosis not present

## 2016-03-16 DIAGNOSIS — I272 Pulmonary hypertension, unspecified: Secondary | ICD-10-CM | POA: Diagnosis present

## 2016-03-16 DIAGNOSIS — I482 Chronic atrial fibrillation, unspecified: Secondary | ICD-10-CM | POA: Insufficient documentation

## 2016-03-16 DIAGNOSIS — I5020 Unspecified systolic (congestive) heart failure: Secondary | ICD-10-CM

## 2016-03-16 DIAGNOSIS — E11649 Type 2 diabetes mellitus with hypoglycemia without coma: Secondary | ICD-10-CM | POA: Diagnosis not present

## 2016-03-16 DIAGNOSIS — I5023 Acute on chronic systolic (congestive) heart failure: Secondary | ICD-10-CM | POA: Diagnosis present

## 2016-03-16 DIAGNOSIS — I130 Hypertensive heart and chronic kidney disease with heart failure and stage 1 through stage 4 chronic kidney disease, or unspecified chronic kidney disease: Secondary | ICD-10-CM | POA: Diagnosis present

## 2016-03-16 DIAGNOSIS — E162 Hypoglycemia, unspecified: Secondary | ICD-10-CM | POA: Insufficient documentation

## 2016-03-16 DIAGNOSIS — Z86010 Personal history of colonic polyps: Secondary | ICD-10-CM

## 2016-03-16 DIAGNOSIS — Z950 Presence of cardiac pacemaker: Secondary | ICD-10-CM

## 2016-03-16 DIAGNOSIS — E860 Dehydration: Secondary | ICD-10-CM | POA: Diagnosis present

## 2016-03-16 DIAGNOSIS — Z8673 Personal history of transient ischemic attack (TIA), and cerebral infarction without residual deficits: Secondary | ICD-10-CM

## 2016-03-16 DIAGNOSIS — R0902 Hypoxemia: Secondary | ICD-10-CM

## 2016-03-16 DIAGNOSIS — Z7901 Long term (current) use of anticoagulants: Secondary | ICD-10-CM

## 2016-03-16 DIAGNOSIS — I5021 Acute systolic (congestive) heart failure: Secondary | ICD-10-CM | POA: Diagnosis not present

## 2016-03-16 HISTORY — DX: Unspecified dementia, unspecified severity, without behavioral disturbance, psychotic disturbance, mood disturbance, and anxiety: F03.90

## 2016-03-16 LAB — URINALYSIS, ROUTINE W REFLEX MICROSCOPIC
Glucose, UA: NEGATIVE mg/dL
HGB URINE DIPSTICK: NEGATIVE
KETONES UR: NEGATIVE mg/dL
Leukocytes, UA: NEGATIVE
Nitrite: NEGATIVE
PROTEIN: 100 mg/dL — AB
Specific Gravity, Urine: 1.015 (ref 1.005–1.030)
pH: 7 (ref 5.0–8.0)

## 2016-03-16 LAB — URINE MICROSCOPIC-ADD ON: WBC, UA: NONE SEEN WBC/hpf (ref 0–5)

## 2016-03-16 LAB — I-STAT TROPONIN, ED
TROPONIN I, POC: 0.02 ng/mL (ref 0.00–0.08)
Troponin i, poc: 0.04 ng/mL (ref 0.00–0.08)

## 2016-03-16 LAB — BASIC METABOLIC PANEL
ANION GAP: 14 (ref 5–15)
BUN: 36 mg/dL — ABNORMAL HIGH (ref 6–20)
CO2: 18 mmol/L — ABNORMAL LOW (ref 22–32)
Calcium: 9.4 mg/dL (ref 8.9–10.3)
Chloride: 102 mmol/L (ref 101–111)
Creatinine, Ser: 2.39 mg/dL — ABNORMAL HIGH (ref 0.44–1.00)
GFR calc Af Amer: 20 mL/min — ABNORMAL LOW (ref >60)
GFR, EST NON AFRICAN AMERICAN: 17 mL/min — AB (ref >60)
GLUCOSE: 79 mg/dL (ref 65–99)
POTASSIUM: 5.5 mmol/L — AB (ref 3.5–5.1)
Sodium: 134 mmol/L — ABNORMAL LOW (ref 135–145)

## 2016-03-16 LAB — I-STAT ARTERIAL BLOOD GAS, ED
ACID-BASE DEFICIT: 12 mmol/L — AB (ref 0.0–2.0)
Bicarbonate: 15.3 mmol/L — ABNORMAL LOW (ref 20.0–28.0)
O2 SAT: 100 %
PH ART: 7.19 — AB (ref 7.350–7.450)
TCO2: 17 mmol/L (ref 0–100)
pCO2 arterial: 40.1 mmHg (ref 32.0–48.0)
pO2, Arterial: 395 mmHg — ABNORMAL HIGH (ref 83.0–108.0)

## 2016-03-16 LAB — RESPIRATORY PANEL BY PCR
ADENOVIRUS-RVPPCR: NOT DETECTED
Bordetella pertussis: NOT DETECTED
CORONAVIRUS 229E-RVPPCR: NOT DETECTED
CORONAVIRUS HKU1-RVPPCR: NOT DETECTED
CORONAVIRUS NL63-RVPPCR: NOT DETECTED
CORONAVIRUS OC43-RVPPCR: NOT DETECTED
Chlamydophila pneumoniae: NOT DETECTED
INFLUENZA A-RVPPCR: NOT DETECTED
Influenza B: NOT DETECTED
MYCOPLASMA PNEUMONIAE-RVPPCR: NOT DETECTED
Metapneumovirus: DETECTED — AB
PARAINFLUENZA VIRUS 1-RVPPCR: NOT DETECTED
PARAINFLUENZA VIRUS 4-RVPPCR: NOT DETECTED
Parainfluenza Virus 2: NOT DETECTED
Parainfluenza Virus 3: NOT DETECTED
Respiratory Syncytial Virus: NOT DETECTED
Rhinovirus / Enterovirus: NOT DETECTED

## 2016-03-16 LAB — COMPREHENSIVE METABOLIC PANEL
ALT: 47 U/L (ref 14–54)
ANION GAP: 12 (ref 5–15)
AST: 169 U/L — ABNORMAL HIGH (ref 15–41)
Albumin: 3.3 g/dL — ABNORMAL LOW (ref 3.5–5.0)
Alkaline Phosphatase: 124 U/L (ref 38–126)
BUN: 44 mg/dL — ABNORMAL HIGH (ref 6–20)
CHLORIDE: 106 mmol/L (ref 101–111)
CO2: 17 mmol/L — ABNORMAL LOW (ref 22–32)
Calcium: 8.7 mg/dL — ABNORMAL LOW (ref 8.9–10.3)
Creatinine, Ser: 2.75 mg/dL — ABNORMAL HIGH (ref 0.44–1.00)
GFR, EST AFRICAN AMERICAN: 17 mL/min — AB (ref >60)
GFR, EST NON AFRICAN AMERICAN: 14 mL/min — AB (ref >60)
Glucose, Bld: 159 mg/dL — ABNORMAL HIGH (ref 65–99)
POTASSIUM: 5.5 mmol/L — AB (ref 3.5–5.1)
SODIUM: 135 mmol/L (ref 135–145)
Total Bilirubin: 2.6 mg/dL — ABNORMAL HIGH (ref 0.3–1.2)
Total Protein: 7 g/dL (ref 6.5–8.1)

## 2016-03-16 LAB — LACTIC ACID, PLASMA
LACTIC ACID, VENOUS: 5.5 mmol/L — AB (ref 0.5–1.9)
LACTIC ACID, VENOUS: 4.4 mmol/L — AB (ref 0.5–1.9)
LACTIC ACID, VENOUS: 3.8 mmol/L — AB (ref 0.5–1.9)
Lactic Acid, Venous: 5.9 mmol/L (ref 0.5–1.9)
Lactic Acid, Venous: 5.4 mmol/L (ref 0.5–1.9)
Lactic Acid, Venous: 3.4 mmol/L (ref 0.5–1.9)

## 2016-03-16 LAB — CBG MONITORING, ED
GLUCOSE-CAPILLARY: 44 mg/dL — AB (ref 65–99)
GLUCOSE-CAPILLARY: 115 mg/dL — AB (ref 65–99)
GLUCOSE-CAPILLARY: 146 mg/dL — AB (ref 65–99)
GLUCOSE-CAPILLARY: 159 mg/dL — AB (ref 65–99)

## 2016-03-16 LAB — CBC
HEMATOCRIT: 35 % — AB (ref 36.0–46.0)
HEMOGLOBIN: 11.8 g/dL — AB (ref 12.0–15.0)
MCH: 30.3 pg (ref 26.0–34.0)
MCHC: 33.7 g/dL (ref 30.0–36.0)
MCV: 90 fL (ref 78.0–100.0)
Platelets: 85 10*3/uL — ABNORMAL LOW (ref 150–400)
RBC: 3.89 MIL/uL (ref 3.87–5.11)
RDW: 16.7 % — ABNORMAL HIGH (ref 11.5–15.5)
WBC: 5.3 10*3/uL (ref 4.0–10.5)

## 2016-03-16 LAB — LACTATE DEHYDROGENASE: LDH: 290 U/L — AB (ref 98–192)

## 2016-03-16 LAB — GLUCOSE, CAPILLARY: Glucose-Capillary: 144 mg/dL — ABNORMAL HIGH (ref 65–99)

## 2016-03-16 LAB — CORTISOL: CORTISOL PLASMA: 46.7 ug/dL

## 2016-03-16 LAB — INFLUENZA PANEL BY PCR (TYPE A & B)
Influenza A By PCR: NEGATIVE
Influenza B By PCR: NEGATIVE

## 2016-03-16 LAB — PROTIME-INR
INR: 1.87
Prothrombin Time: 21.8 seconds — ABNORMAL HIGH (ref 11.4–15.2)

## 2016-03-16 LAB — TSH: TSH: 1.09 u[IU]/mL (ref 0.350–4.500)

## 2016-03-16 LAB — BRAIN NATRIURETIC PEPTIDE

## 2016-03-16 LAB — PROCALCITONIN: PROCALCITONIN: 0.5 ng/mL

## 2016-03-16 MED ORDER — VANCOMYCIN HCL IN DEXTROSE 1-5 GM/200ML-% IV SOLN
1000.0000 mg | Freq: Once | INTRAVENOUS | Status: AC
  Administered 2016-03-16: 1000 mg via INTRAVENOUS
  Filled 2016-03-16: qty 200

## 2016-03-16 MED ORDER — SODIUM CHLORIDE 0.9 % IV BOLUS (SEPSIS): 1000.0000 mL | Freq: Once | INTRAVENOUS | Status: DC

## 2016-03-16 MED ORDER — ACETAMINOPHEN 650 MG RE SUPP: 650.0000 mg | Freq: Four times a day (QID) | RECTAL | Status: DC | PRN

## 2016-03-16 MED ORDER — PIPERACILLIN-TAZOBACTAM 3.375 G IVPB 30 MIN
3.3750 g | Freq: Once | INTRAVENOUS | Status: DC
  Filled 2016-03-16: qty 50

## 2016-03-16 MED ORDER — PIPERACILLIN-TAZOBACTAM 3.375 G IVPB 30 MIN
3.3750 g | Freq: Once | INTRAVENOUS | Status: AC
  Administered 2016-03-16: 3.375 g via INTRAVENOUS

## 2016-03-16 MED ORDER — IPRATROPIUM-ALBUTEROL 0.5-2.5 (3) MG/3ML IN SOLN
3.0000 mL | Freq: Four times a day (QID) | RESPIRATORY_TRACT | Status: DC | PRN
  Administered 2016-03-18: 3 mL via RESPIRATORY_TRACT
  Filled 2016-03-16: qty 3

## 2016-03-16 MED ORDER — SODIUM CHLORIDE 0.9% FLUSH
3.0000 mL | Freq: Two times a day (BID) | INTRAVENOUS | Status: DC
  Administered 2016-03-16 – 2016-03-21 (×8): 3 mL via INTRAVENOUS

## 2016-03-16 MED ORDER — DICLOFENAC SODIUM 1 % TD GEL: 2.0000 g | Freq: Four times a day (QID) | TRANSDERMAL | Status: DC | PRN

## 2016-03-16 MED ORDER — PIPERACILLIN-TAZOBACTAM IN DEX 2-0.25 GM/50ML IV SOLN
2.2500 g | Freq: Three times a day (TID) | INTRAVENOUS | Status: DC
  Administered 2016-03-16 – 2016-03-18 (×5): 2.25 g via INTRAVENOUS
  Filled 2016-03-16 (×8): qty 50

## 2016-03-16 MED ORDER — VANCOMYCIN HCL IN DEXTROSE 750-5 MG/150ML-% IV SOLN
750.0000 mg | INTRAVENOUS | Status: DC
  Filled 2016-03-16: qty 150

## 2016-03-16 MED ORDER — SODIUM CHLORIDE 0.9 % IV SOLN
INTRAVENOUS | Status: DC
  Administered 2016-03-16: 07:00:00 via INTRAVENOUS

## 2016-03-16 MED ORDER — SODIUM CHLORIDE 0.9 % IV BOLUS (SEPSIS)
250.0000 mL | Freq: Once | INTRAVENOUS | Status: AC
  Administered 2016-03-16: 250 mL via INTRAVENOUS

## 2016-03-16 MED ORDER — FUROSEMIDE 10 MG/ML IJ SOLN
40.0000 mg | Freq: Once | INTRAMUSCULAR | Status: AC
  Administered 2016-03-16: 40 mg via INTRAVENOUS
  Filled 2016-03-16: qty 4

## 2016-03-16 MED ORDER — METOPROLOL TARTRATE 25 MG PO TABS: 100.0000 mg | ORAL_TABLET | Freq: Two times a day (BID) | ORAL | Status: DC

## 2016-03-16 MED ORDER — DEXTROSE 50 % IV SOLN
INTRAVENOUS | Status: AC
  Filled 2016-03-16: qty 50

## 2016-03-16 MED ORDER — IPRATROPIUM-ALBUTEROL 0.5-2.5 (3) MG/3ML IN SOLN
3.0000 mL | Freq: Four times a day (QID) | RESPIRATORY_TRACT | Status: DC
  Administered 2016-03-16 (×3): 3 mL via RESPIRATORY_TRACT
  Filled 2016-03-16 (×3): qty 3

## 2016-03-16 MED ORDER — ASPIRIN EC 81 MG PO TBEC
81.0000 mg | DELAYED_RELEASE_TABLET | Freq: Every day | ORAL | Status: DC
  Administered 2016-03-17 – 2016-03-21 (×5): 81 mg via ORAL
  Filled 2016-03-16 (×5): qty 1

## 2016-03-16 MED ORDER — IPRATROPIUM-ALBUTEROL 0.5-2.5 (3) MG/3ML IN SOLN: 3.0000 mL | Freq: Four times a day (QID) | RESPIRATORY_TRACT | Status: DC

## 2016-03-16 MED ORDER — ENOXAPARIN SODIUM 30 MG/0.3ML ~~LOC~~ SOLN
30.0000 mg | Freq: Every day | SUBCUTANEOUS | Status: DC
  Administered 2016-03-17 – 2016-03-18 (×2): 30 mg via SUBCUTANEOUS
  Filled 2016-03-16 (×3): qty 0.3

## 2016-03-16 MED ORDER — POLYETHYLENE GLYCOL 3350 17 G PO PACK: 17.0000 g | PACK | Freq: Every day | ORAL | Status: DC | PRN

## 2016-03-16 MED ORDER — DEXTROSE 50 % IV SOLN
1.0000 | Freq: Once | INTRAVENOUS | Status: AC
  Administered 2016-03-16: 50 mL via INTRAVENOUS

## 2016-03-16 MED ORDER — DEXTROSE-NACL 5-0.9 % IV SOLN
INTRAVENOUS | Status: DC
  Administered 2016-03-16 – 2016-03-17 (×2): via INTRAVENOUS

## 2016-03-16 MED ORDER — ENSURE ENLIVE PO LIQD
237.0000 mL | Freq: Three times a day (TID) | ORAL | Status: DC
  Administered 2016-03-16 – 2016-03-21 (×11): 237 mL via ORAL
  Filled 2016-03-16: qty 237

## 2016-03-16 MED ORDER — ACETAMINOPHEN 325 MG PO TABS: 650.0000 mg | ORAL_TABLET | Freq: Four times a day (QID) | ORAL | Status: DC | PRN

## 2016-03-16 NOTE — H&P (Signed)
Date: 03/16/2016               Patient Name:  [name] MRN: [id number]  DOB: 08/14/1925 Age / Sex: 82 y.o., female   PCP: Sid Falcon, MD         Medical Service: Internal Medicine Teaching Service         Attending Physician: Dr. Annia Belt, MD    First Contact: Dr. Wynetta Emery Pager: [phone]  Second Contact: Dr. Tiburcio Pea Pager: [phone]       After Hours (After 5p/  First Contact Pager: [phone]  weekends / holidays): Second Contact Pager: [phone]   Chief Complaint: Cough, SOB, wheezing, weakness, loss of appetite, mild confusion.  History of Present Illness:  [name] is a 82 y.o. woman with PMHx of sick sinus syndrome with single lead pacemaker, HFrEF (LV EF 35-40% in Sept 2017), pulmonary HTN by Sept 2017 Echo, atrial fibrillation, CKD, HTN, GERD, CVA in 2000 who presents with her daughter for various complaints of both a productive and non-productive cough, shortness of breath, wheezing, weakness, and loss of appetite.  Daughter also reports some mild confusion which is new.  States her symptoms began on Sunday but then worsened on Monday and Tuesday, thus prompting evaluation in the emergency department.    Daughter reports that patient has been short of breath to the point of being unable to walk across her house without needing to rest.  She reports her cough is sometimes dry, but at other times will be productive of a white colored mucus.  During this illness, she has also experienced decreased fluid intake and decreased PO intake while continuing with her normal dose of Lasix.  Her daughter reports decreased urine output as well.  Her weight has remained stable between 118-122 pounds at home.  She has not had a weight recorded in the ED.  She reports subjective fevers, chills, nausea without vomiting, diarrhea, and stable lower extremity swelling.  Her daughter reports the edema is chronic and not any worse than previous.  She also has stable 2  pillow orthopnea. She denies dysuria, abdominal pain, chest pain, myalgias.  She reports sick contacts at home with similar viral-like complaints.  She was seen in clinic on Nov 1 and reported that on a good day she has shortness of breath over the distance of walking from the front door of her house to the car. She has also had cough, runny nose, sneezing, and itchy eyes for the last 2 weeks at that time.    In the ED, vitals were notable for a rectal temperature of 96.7* with recheck being 95.8*.  Other vitals stable with HR 96, RR 20, BP 117/70, saturating at 95% on ambient air.  Labs were remarkable for BMET with sodium 134, potassium 5.5, CO2 18, anion gap mildly elevated at 14, BUN 36, creatinine 2.39 (baseline ~1.2-1.3).  CBC without leukocytosis, hemoglobin 11.8, HCT 35, MCV 90, platelets 85 which was consistent with previous results.  BNP was grossly elevated greater than 4,500.  PT 21.8, INR 1.87.  Point of care troponin negative.  Urinalysis was amber in color, small bilirubin, 100 protein, negative for nitrites or leukocytes.  CXR showed no acute consolidation, stable cardiomegaly without evidence of edema.  She was given IV Lasix 40mg  in the ED.  Meds:  Current Meds  Medication Sig  . aspirin EC 81 MG tablet Take 81 mg by mouth daily.  . diclofenac sodium (VOLTAREN) 1 %  GEL Apply 2 g topically 4 (four) times daily. To left shoulder. (Patient taking differently: Apply 2 g topically 4 (four) times daily as needed (pain). To left shoulder.)  . furosemide (LASIX) 20 MG tablet Take 3 tabs (60mg ) every morning and 2 tabs (40mg ) every afternoon.  Marland Kitchen lisinopril (PRINIVIL,ZESTRIL) 20 MG tablet TAKE 1 TABLET BY MOUTH DAILY  . metoprolol (LOPRESSOR) 100 MG tablet TAKE 1 TABLET(100 MG) BY MOUTH TWICE DAILY  . Nutritional Supplements ([place]) LIQD Take 237 mLs by mouth 3 (three) times daily.  . potassium chloride 20 MEQ/15ML (10%) SOLN TAKE 15 MLS BY MOUTH DAILY      Allergies: Allergies as of 03/16/2016  . (No Known Allergies)   Past Medical History:  Diagnosis Date  . Adenomatous colon polyp    tubular  . Allergic rhinitis   . Anemia    Iron deficiency, does not tolerate po  . Arthritis    "all over my body"  . Atrial fibrillation (HCC)    on coumadin followed by Dr. Elie Confer  . CHF (congestive heart failure) (HCC)    diastolic dysfunction  . Constipation   . Diverticulosis of colon (without mention of hemorrhage)   . DJD (degenerative joint disease)   . GERD (gastroesophageal reflux disease)   . History of sick sinus syndrome s/p pacemaker Dr. Lovena Le 2000  . History of trichomonal urethritis   . History of vaginal bleeding 05/2005   nagative endometrial biopsy  . Hypertension    well controlled on 3 agents  . Incontinence overflow, stress female    on vesicare  . Onychomycosis   . Osteoporosis   . Pacemaker   . Restrictive lung disease 1996   PFT's showed mild disease  . Stroke [facility]) 2002   R MCA, cardioembolic  . Type II diabetes mellitus (HCC)    diet controlled  . Weight loss, unintentional 2010-2011   8/08-8/10 138-146 lbs, between 8/10-10/11 lost 18 lbs., work-up negative    Family History: DM in Mom and Dad. MI in Dad, and CVA in Mom.  Social History: Lives at home alone, but with family close by who check on her almost daily. Her daughters help manage her medication. She drinks one beer twice weekly. She has never smoked tobacco and denies illicit drug use.   Review of Systems: A complete ROS was negative except as per HPI.   Physical Exam: Blood pressure (!) 123/34, pulse 84, temperature (S) (!) 95.8 F (35.4 C), temperature source Rectal, resp. rate 22, SpO2 95 %. General: resting in bed, no distress, appears fatigued, coughing intermittently HEENT: EOMI, no scleral icterus, dry mucus membranes Neck: Tortuous neck veins Cardiac: Irregular rate and rhythm, no rubs, murmurs or gallops Pulm: normal effort, mild  bilateral wheezes, no significant crackles Abd: soft, nontender, nondistended, BS present Ext: cool to touch, 1+ pedal edema Neuro: alert and oriented X3, cranial nerves II-XII grossly intact Skin: no rashes or erythema Psych: normal mood and affect.   EKG: pending  CXR:  IMPRESSION: 1. No acute consolidations. 2. Stable cardiomegaly without overt failure  Assessment & Plan by Problem: Active Problems:   Atrial fibrillation, controlled (HCC)   Thrombocytopenia (HCC)   CHF exacerbation (HCC)   AKI (acute kidney injury) ([place])   Malaise and fatigue  Patient is an 82 yo F with a PMHx of HFrEF (30-35%) with pacemaker in place, chronic a-fib, and CKD stage 3 who presents with multiple complaints and general malaise from home.   General Malaise:  She reports 2-3 days of feeling unwell with cough, SOB, weakness, loss of appetite, and decreased PO intake.  Her symptoms appear consistent with a viral-like illness.  She appears on my exam to be more dehydrated as opposed to volume overloaded if this were an acute CHF exacerbation.  Her urine is not indicative of infection and she has a normal WBC.  She is hypothermic which could be due to underlying infection versus malnutrition, extremes of age, hypothyroidism. - Check respiratory virus panel and influenza panel - Blood cultures x 2 - Check lactic acid - Warming blanket as needed - Hold off on antibiotics at this point - Duonebs Q6H while awake - Supplemental oxygen as needed to keep above 92% - Admit to telemetry - Consider TSH - Repeat 2-View CXR tomorrow  AKI on CKD 3: Her serum creatinine today is 2.39 which is increased from a baseline of about 1.2-1.3 with most recent being 1.22 on 12/28/2015.  She has had decreased PO intake the past few days while continuing her diuretics.  Suspect pre-renal physiology in this setting. - Gentle hydration with normal saline at 75cc/hr for 12 hours  Anion Gap Metabolic Acidosis: serum bicarbonate 18  with anion gap of 14.  Possibly related to acute renal insufficiency.  Given her hypothermia and concern for underlying infection, may be due to elevated lactic acid - Check lactic acid - Fluids as above  Mild Hyperkalemia: mild without symptoms - EKG pending - Hold home potassium supplements - Follow BMET  HFrEF: BNP this admission >4500.  Chest X-ray is clear, weight stable, no change in her orthopnea, peripheral edema is unchanged.  She appears to be euvolemic on my exam.  TTE in Sept 2017 with LV EF 35-40%.  Moderate diffuse hypokinesis with no identifiable regional variations.  She has known right atrial enlargement with severely dilated right atrium on this recent Echo which could lead to dissociation of her BNP level from intravascular volume status.  She also has increased PA pressures which can lead to elevated BNP levels.  Likewise, infection can also be a common trigger of elevated BNP. - Continue metoprolol 100 mg BID - Hold lasix for now  - TED hose - Daily weights, I/O's  A-Fib: Chronic, taken off warfarin a few years ago due to falls and bleeding risk. - Continue ASA 81 daily  - Telemetry  HTN: normotensive at admission -  Hold home lisinopril 20 mg daily   Thrombocytopenia: Platelet count of 85 on admission consistent with previous results. Likely chronic as thrombocytopenia is listed under her problem list. No signs of active bleeding at this time. - Continue to monitor   FEN Fluid: NS at 75cc/hr x 12 hours Electrolytes: replace as needed Nutrition: Heart healthy  DVT PPx: Lovenox  CODE: FULL  Dispo: Admit patient to Observation with expected length of stay less than 2 midnights.  Signed: Jule Ser, DO 03/16/2016, 6:09 AM  Pager: [phone]

## 2016-03-16 NOTE — Progress Notes (Signed)
Pharmacy Antibiotic Note  [name] is a 83 y.o. female admitted on 03/16/2016 with sepsis.  Pharmacy has been consulted for vancomycin and zosyn dosing. Wt 55.4 kg on 02/17/16, WBC WNL, Creat 2.39, creat cl ~ 14 ml/min.  lactate 5.9 and 5.4, hypothermic. CXR: neg.   Plan:vancomycin 1 gm IV x1 dose then Vancomycin 750 mg IV every 48 hours.  Goal trough 15-20 mcg/mL.  Zosyn 3.375 gm IV x1 then 2.25 gm IV q8h F/u renal fxn, wbc, temp, culture data Vancomycin level as needed    Temp (24hrs), Avg:97 F (36.1 C), Min:95.8 F (35.4 C), Max:98.1 F (36.7 C)   Recent Labs Lab 03/16/16 0146 03/16/16 0605 03/16/16 0845  WBC 5.3  --   --   CREATININE 2.39*  --   --   LATICACIDVEN  --  5.9* 5.4*    CrCl cannot be calculated (Unknown ideal weight.).    No Known Allergies  Eudelia Bunch, Pharm.D. BP:[phone] 03/16/2016 10:57 AM

## 2016-03-16 NOTE — ED Notes (Signed)
Updated Dr Randell Patient on pt status.

## 2016-03-16 NOTE — Progress Notes (Addendum)
CRITICAL VALUE ALERT  Critical value received:  Lactic Acid 3.8  Date of notification:  03/16/2016   Time of notification:  2130  Critical value read back:Yes.    Nurse who received alert:  Monico Hoar, RN   MD notified (1st page):  IMTS  Time of first page:  2200   MD notified (2nd page):  Time of second page:  Responding MD:  Molt   Time MD responded:

## 2016-03-16 NOTE — Progress Notes (Signed)
MD made aware that pts pacemaker is pacing then went into 6 beat run of vtach, out of vtach into afib and now back pacing in NSR.Marland Kitchen

## 2016-03-16 NOTE — ED Notes (Signed)
Pt remains under bair hugger.

## 2016-03-16 NOTE — ED Notes (Signed)
Activated Code Sepsis 

## 2016-03-16 NOTE — Progress Notes (Addendum)
Subjective: Lactic acid 5.9 this morning. Temperature 95.8 F and pressures dropped to 90s/50s. Gave 250 cc bolus and pressures improved to 110s-120s/60-70s. Received reports from RN that SpO2 80-90% on 6L [place], intermittent poor waveform, also with CBG 44 at 10AM. Given amp D50, and placed on NRB O2 15 L with some oxygenation improvement, CBG recovered to 115.   On rounds, numerous family at bedside. Patient remains confused, pulling off mask and intermittently desaturating - lungs with rhonchi and wheezing bilaterally. STAT ABG drawn pH 7.19 // 41 // 395 // 15.3 - concerning for combined metabolic and respiratory acidosis.  Discussed with CCM/Dr. Titus Mould - advised to start BiPAP 10/5, IVF boluses per sepsis protocol, agreed to evaluate - upon evaluation felt to be more cardiogenic. Now plan to discontinue IVF, obtain stat TTE, prn BiPAP  Objective: Vital signs in last 24 hours: Vitals:   03/16/16 0730 03/16/16 0745 03/16/16 0800 03/16/16 0815  BP: (!) 98/49 (!) 99/49 118/62 125/78  Pulse: (!) 59 (!) 59 61 63  Resp: 19 18    Temp:      TempSrc:      SpO2: 97% 92% 98% 98%    Intake/Output Summary (Last 24 hours) at 03/16/16 0848 Last data filed at 03/16/16 0819  Gross per 24 hour  Intake              250 ml  Output                0 ml  Net              250 ml   Physical Exam General appearance: Elderly AA woman, appears fatigued, pulling off NRB facemask HENT: Normocephalic, atraumatic, tortuous neck veins, JVD, dry mucous membranes Cardiovascular: Irregular rhythm and normal rate, no murmurs, rubs, gallops Respiratory/Chest: Diffuse rhonchi and expiratory wheeze bilaterally, tachypneic Abdomen: Soft, non-tender, non-distended Ext: Thin, normal ROM, 1+ peripheral pulses, no obvious edema Skin: Hands cool to touch, dry, intact Neuro: Cranial nerves grossly intact, alert but confused Psych: Normal affect  Labs / Imaging / Procedures: CBC Latest Ref Rng & Units 03/16/2016  01/04/2016 12/28/2015  WBC 4.0 - 10.5 K/uL 5.3 3.8 5.2  Hemoglobin 12.0 - 15.0 g/dL 11.8(L) - 10.1(L)  Hematocrit 36.0 - 46.0 % 35.0(L) 32.1(L) 31.4(L)  Platelets 150 - 400 K/uL 85(L) 152 92(L)   BMP Latest Ref Rng & Units 03/16/2016 12/28/2015 12/27/2015  Glucose 65 - 99 mg/dL 79 124(H) -  BUN 6 - 20 mg/dL 36(H) 17 -  Creatinine 0.44 - 1.00 mg/dL 2.39(H) 1.22(H) 1.29(H)  BUN/Creat Ratio 12 - 28 - - -  Sodium 135 - 145 mmol/L 134(L) 141 -  Potassium 3.5 - 5.1 mmol/L 5.5(H) 4.3 -  Chloride 101 - 111 mmol/L 102 106 -  CO2 22 - 32 mmol/L 18(L) 29 -  Calcium 8.9 - 10.3 mg/dL 9.4 9.2 -   Dg Chest 1 View  Result Date: 03/16/2016 CLINICAL DATA:  Shortness of breath for 2 days EXAM: CHEST 1 VIEW COMPARISON:  12/27/2015 FINDINGS: A left-sided single lead pacing device appears similar compared to the prior study. There is moderate cardiomegaly but no overt edema. There is atherosclerosis of the aorta. No acute consolidation. No pneumothorax. IMPRESSION: 1. No acute consolidations. 2. Stable cardiomegaly without overt failure Electronically Signed   By: Donavan Foil M.D.   On: 03/16/2016 02:45   Assessment/Plan: Patient is an 82 yo F with a PMHx of HFrEF (30-35%), SSS, with single-lead pacemaker in place, chronic  a-fib, and CKD stage 3 who presents with multiple complaints and general malaise from home.    Active Problems:   Atrial fibrillation, controlled (HCC)   Thrombocytopenia (HCC)   CHF exacerbation (HCC)   AKI (acute kidney injury) ([place])   Malaise and fatigue  Acute hypoxic respiratory failure, cardiogenic vs pneumonia,  Hypotension with increasing O2 req, lactic acidosis to 5.9 >> 5.4 >> 5.5, increasing dyspnea and productive cough at home, CXR with no clear infiltrate but significant cardiomegaly, procalcitonin 0.5,  ABG with combined metabolic and respiratory acidosis. EKG - afib, ventricular paced, concern for anterior ischemic changes, prolonged QT - STAT ABG - completed 7.19 / 40 /  395 /15.3 - Continue supplemental O2 via NRB / nasal cannula as needed to maintain SpO2 > 92% - Follow TTE results - Blood cultures pending - Trend lactic acid - Trend troponins - Repeat EKG tomorrow AM - NPO - D5NS at 50cc/hr - Empiric vanc/zosyn per pharmacy - Appreciate CCM recommendations - Repeat CXR 24 hrs from previous  AKI on CKD3, Cr 2.39 from baseline 1.2-1.3, likely pre-renal in setting of recent poor po intake and hypotension - IVF per above - Trend BMPs  Hypogylcemia, not DM or on insulin, possible sepsis vs capillary check artifact, BMP with glucose 79 at 1AM, CBG 44 at 10AM, improved to 115 with amp of D15 - CBG Q2H until stable - D5NS at 50cc/hr  Mild Hyperkalemia: mild without symptoms, K 5.5 on admission - Hold home potassium supplements - Follow BMET  HFrEF: BNP this admission >4500.  CXR with cardiomegaly, no clear infiltrate or pulmonary edema, weight 122 lbs up from baseline 114 lbs - no change in her orthopnea, peripheral edema is unchanged. Tortuous neck veins concern for JVD. TTE in Sept 2017 with LV EF 35-40%.  Moderate diffuse hypokinesis with no identifiable regional variations, severe MR and pulmonary HTN.  She has known right atrial enlargement with severely dilated right atrium on this recent Echo which could lead to dissociation of her BNP level from intravascular volume status.  - Hold metoprolol 100 mg BID - Hold lasix for now  - TED hose - Daily weights, I/O's  A-Fib: Chronic, taken off warfarin a few years ago due to falls and bleeding risk. - Continue ASA 81 daily  - Telemetry  HTN: normotensive to hypotensive  -  Hold home lisinopril 20 mg daily   Thrombocytopenia: Platelet count of 85 on admission consistent with previous results. Likely chronic as thrombocytopenia is listed under her problem list. No signs of active bleeding at this time. - Trend CBC   FEN Fluid: D5NS 50cc/hr  Electrolytes: replace as necessary Nutrition:  NPO  DVT PPx: Lovenox  Code status: DNR  Dispo: Anticipated discharge in approximately 3-4 day(s). Admit to SDU.  LOS: 0 days   Asencion Partridge, MD 03/16/2016, 8:48 AM Pager: [phone]

## 2016-03-16 NOTE — ED Notes (Addendum)
Per Dr Beryle Beams, pt is septic.

## 2016-03-16 NOTE — ED Notes (Signed)
Dr. Schlossman at bedside. 

## 2016-03-16 NOTE — ED Notes (Addendum)
Pt has become more alert and back to baseline.  Pt placed on non-rebreather to assist with SpO2 on RA that was a questionable reading.  SpO2 on 6 L nasal cannula 85-90%.  Dr Billy Fischer remains at bedside on the phone with pts daughter and POA.

## 2016-03-16 NOTE — ED Notes (Signed)
Dr. Vanita Panda informed of 3.4 lactic

## 2016-03-16 NOTE — ED Notes (Signed)
Admitting at bedside 

## 2016-03-16 NOTE — ED Notes (Signed)
Pt's daughter called out, this RN entered room to find pt nonresponsive, drooling.  This RN repositioned, called MD to bedside and suctioned pt.

## 2016-03-16 NOTE — ED Notes (Signed)
Informed Dr Vanita Panda of Lactic of 4.4

## 2016-03-16 NOTE — ED Notes (Signed)
POCT CBG resulted 146; Levada Dy, RN aware

## 2016-03-16 NOTE — ED Provider Notes (Signed)
Patient has a history of congestive heart failure and her daughter reports she's had loss of appetite and some weight loss recently. She's been having shortness of breath and coughing and feels weak. Her legs been swelling.  Patient is a very thin elderly female who appears to be short of breath. She has some audible rales at times. She has some rales at the bases of her lungs. She has marked JVD at 23. She has some edema of her lower extremities.  Medical screening examination/treatment/procedure(s) were conducted as a shared visit with non-physician practitioner(s) and myself.  I personally evaluated the patient during the encounter.   ED ECG REPORT   Date: 03/16/2016  Rate: 60  Rhythm: ventricular paced rhythm  QRS Axis:   Intervals:   ST/T Wave abnormalities:   Conduction Disutrbances:  Narrative Interpretation:   Old EKG Reviewed: none available, but patient has a pacemaker  I have personally reviewed the EKG tracing and agree with the computerized printout as noted.   Rolland Porter, MD, Barbette Or, MD 03/16/16 [id number]

## 2016-03-16 NOTE — ED Notes (Signed)
Bear hugger applied for rectal temp of 95.8. Admitting MD has been paged, no response yet. Pt resting in bed comfortably. Will continue to monitor.

## 2016-03-16 NOTE — Progress Notes (Signed)
ABG results called to Dr. Wynetta Emery.

## 2016-03-16 NOTE — Consult Note (Signed)
Name: ADISYNNE STAGGS MRN: [id number] DOB: 12-29-1925    ADMISSION DATE:  03/16/2016 CONSULTATION DATE:  Graciella Freer, Dr Rene Kocher MD :  11/29  CHIEF COMPLAINT:  Lactic acidosis, mild distress BRIEF PATIENT DESCRIPTION: 82 yr old chf, pulm HTN, dementia presented with weakness   HISTORY OF PRESENT ILLNESS: 82 yr old AAF with  hypertension, chronic systolic heart failure with ejection fraction 35% percent on echocardiogram in September 2017, pulmonary hypertension likely secondary to that, sick sinus syndrome status post permanent pacemaker, chronic atrial fibrillation on aspirin thromboprophylaxis, cerebrovascular disease with prior stroke, and chronic renal insufficiency stage II with baseline creatinine 1.3, GFR 40. She presents with an approximate 3 day history of increasing weakness, mild confusion, intermittent cough, no sig sputum, wheezing, and dyspnea. Subjective fever and chills. To ER, treated by IMTS for concerns sepsis, was YPOTHERMIC in ER and increasing SOB. Called for assessment for this and lactic flat.   PAST MEDICAL HISTORY :   has a past medical history of Adenomatous colon polyp; Allergic rhinitis; Anemia; Arthritis; Atrial fibrillation ([place]); CHF (congestive heart failure) ([place]); Constipation; Dementia; Diverticulosis of colon (without mention of hemorrhage); DJD (degenerative joint disease); GERD (gastroesophageal reflux disease); History of sick sinus syndrome (s/p pacemaker Dr. Lovena Le 2000); History of trichomonal urethritis; History of vaginal bleeding (05/2005); Hypertension; Incontinence overflow, stress female; Onychomycosis; Osteoporosis; Pacemaker; Restrictive lung disease (1996); Stroke [facility]) (2002); Type II diabetes mellitus ([place]); and Weight loss, unintentional (2010-2011).  has a past surgical history that includes Cholecystectomy; Cataract extraction; Abdominal hysterectomy; Pacemaker generator change (10/2012); Insert / replace / remove pacemaker (2000);  pacemaker generator change (N/A, 11/02/2012); Esophagogastroduodenoscopy; and Colonoscopy. Prior to Admission medications   Medication Sig Start Date End Date Taking? Authorizing Provider  aspirin EC 81 MG tablet Take 81 mg by mouth daily.   Yes Historical Provider, MD  diclofenac sodium (VOLTAREN) 1 % GEL Apply 2 g topically 4 (four) times daily. To left shoulder. Patient taking differently: Apply 2 g topically 4 (four) times daily as needed (pain). To left shoulder. 12/29/15  Yes Alexa Angela Burke, MD  furosemide (LASIX) 20 MG tablet Take 3 tabs (60mg ) every morning and 2 tabs (40mg ) every afternoon. 01/04/16  Yes Jule Ser, DO  lisinopril (PRINIVIL,ZESTRIL) 20 MG tablet TAKE 1 TABLET BY MOUTH DAILY 08/21/15  Yes Sid Falcon, MD  metoprolol (LOPRESSOR) 100 MG tablet TAKE 1 TABLET(100 MG) BY MOUTH TWICE DAILY 04/24/15  Yes Sid Falcon, MD  Nutritional Supplements ([place]) LIQD Take 237 mLs by mouth 3 (three) times daily. 12/19/14  Yes Zada Finders, MD  potassium chloride 20 MEQ/15ML (10%) SOLN TAKE 15 MLS BY MOUTH DAILY 11/03/15  Yes Sid Falcon, MD   No Known Allergies  FAMILY HISTORY:  family history includes Diabetes in her sister; Heart disease in her father; Stroke in her sister. SOCIAL HISTORY:  reports that she has never smoked. She has never used smokeless tobacco. She reports that she drinks alcohol. She reports that she does not use drugs.  REVIEW OF SYSTEMS:   Unable to obtain clearly for increasing sob  SUBJECTIVE: no cp, improved neurostatus in ER, increase RR  VITAL SIGNS: Temp:  [95.8 F (35.4 C)-98.1 F (36.7 C)] 98.1 F (36.7 C) (11/29 0909) Pulse Rate:  [49-159] 69 (11/29 1530) Resp:  [11-27] 18 (11/29 1615) BP: (91-125)/(34-94) 123/72 (11/29 1615) SpO2:  [92 %-100 %] 92 % (11/29 1530)  PHYSICAL EXAMINATION: General:  frail Neuro:  fc well, O x 4,  nonfocal, weak HEENT:  JVD significant Cardiovascular:  s1 s2 RRR rt heeve, m Lungs:  Cta,  mild ronchi anterior with cough Abdomen:  Soft, BS wnl, no r/g Musculoskeletal:  Mild edema  Skin:  No rash   Recent Labs Lab 03/16/16 0146 03/16/16 1503  NA 134* 135  K 5.5* 5.5*  CL 102 106  CO2 18* 17*  BUN 36* 44*  CREATININE 2.39* 2.75*  GLUCOSE 79 159*    Recent Labs Lab 03/16/16 0146  HGB 11.8*  HCT 35.0*  WBC 5.3  PLT 85*   Dg Chest 1 View  Result Date: 03/16/2016 CLINICAL DATA:  Shortness of breath for 2 days EXAM: CHEST 1 VIEW COMPARISON:  12/27/2015 FINDINGS: A left-sided single lead pacing device appears similar compared to the prior study. There is moderate cardiomegaly but no overt edema. There is atherosclerosis of the aorta. No acute consolidation. No pneumothorax. IMPRESSION: 1. No acute consolidations. 2. Stable cardiomegaly without overt failure Electronically Signed   By: Donavan Foil M.D.   On: 03/16/2016 02:45    ASSESSMENT / PLAN:  Mild to moderate resp failure Lactic acidosis ( unclear cause, r/o sepsis vs cardiac) R/o PNA, not currently evident pulm HTN, severe CHF  - this is not clear to me, she seems well perfused on exam , with warm ext , with hypothermia we obligated to treat for bacteremia/ sepsis Treat empiric abx With worsening resp status, severe pulm htn and increasing JVD, I am reluctant to bolus with fluid further on a clinical basis Dc bolus further, she should NOT be a code sepsis Lactic repeat done Would add d5 ns at 50 and repeat glu Get echo and assess endocarditis or other worsening pa pressures I have had extensive discussions with family daughters and Patient ( al o x 4). We discussed patients current circumstances and organ failures. We also discussed patient's prior wishes under circumstances such as this. Family has decided to NOT perform resuscitation if arrest but to continue current medical support for now. Can admit to sdu Will follow With cardiac condition, lactic may be more from this component Dc lasix for now,  pcxr in am   Ccm time 35 min   Lavon Paganini. Titus Mould, MD, [place] Pgr: [facility]  Pulmonary and [place] Pager: [phone]  03/16/2016, 4:38 PM

## 2016-03-16 NOTE — ED Notes (Signed)
Patient sitting up on stretcher talking to visitors at bedside, no needs at this time; phleb at bedside also

## 2016-03-16 NOTE — ED Notes (Signed)
Notified Dr Randell Patient of pt status and need for different bed assignment.

## 2016-03-16 NOTE — ED Notes (Signed)
Dr. Vanita Panda notified on elevated lactic acid result.

## 2016-03-16 NOTE — ED Notes (Signed)
EKG was done in triage, not crossing over. Given to [place] PA

## 2016-03-16 NOTE — ED Notes (Signed)
Dr. Feinstein at bedside   

## 2016-03-16 NOTE — ED Notes (Signed)
Dr Beryle Beams at bedside.

## 2016-03-16 NOTE — ED Notes (Signed)
Warm blanket was given to patient; visitor at bedisde

## 2016-03-16 NOTE — ED Triage Notes (Signed)
Pt c/o shortness of breath x 2 days with chills and productive cough

## 2016-03-16 NOTE — ED Provider Notes (Signed)
[facility] DEPT Provider Note   CSN: [id number] Arrival date & time: 03/16/16  0109     History   Chief Complaint Chief Complaint  Patient presents with  . Shortness of Breath    HPI [name] is a 82 y.o. female with a hx of CHF, anemia, A. Fib (on Coumadin), pacemaker (inserted in 2006), osteoporosis, CVA presents to the Emergency Department complaining of gradual, persistent, progressively worsening fatigue and generalized weakness onset 2-3 days ago. Patient's daughter is at bedside and reports that she believes her mother is also experiencing some shortness of breath as she has a visible increased work of breathing. Patient is prescribed Lasix but daughter reports the patient does not regularly take this medication because it causes her to urinate often.  Patient and daughter deny fevers or chills, nausea or vomiting. They both deny sick contacts. Nothing makes her symptoms better. Exertion makes her symptoms worse.  The history is provided by the patient, a relative and medical records. No language interpreter was used.    Past Medical History:  Diagnosis Date  . Adenomatous colon polyp    tubular  . Allergic rhinitis   . Anemia    Iron deficiency, does not tolerate po  . Arthritis    "all over my body"  . Atrial fibrillation (HCC)    on coumadin followed by Dr. Elie Confer  . CHF (congestive heart failure) (HCC)    diastolic dysfunction  . Constipation   . Diverticulosis of colon (without mention of hemorrhage)   . DJD (degenerative joint disease)   . GERD (gastroesophageal reflux disease)   . History of sick sinus syndrome s/p pacemaker Dr. Lovena Le 2000  . History of trichomonal urethritis   . History of vaginal bleeding 05/2005   nagative endometrial biopsy  . Hypertension    well controlled on 3 agents  . Incontinence overflow, stress female    on vesicare  . Onychomycosis   . Osteoporosis   . Pacemaker   . Restrictive lung disease 1996   PFT's showed  mild disease  . Stroke [facility]) 2002   R MCA, cardioembolic  . Type II diabetes mellitus (HCC)    diet controlled  . Weight loss, unintentional 2010-2011   8/08-8/10 138-146 lbs, between 8/10-10/11 lost 18 lbs., work-up negative    Patient Active Problem List   Diagnosis Date Noted  . CHF exacerbation ([place]) 03/16/2016  . AKI (acute kidney injury) ([place]) 03/16/2016  . Malaise and fatigue 03/16/2016  . Syncope 12/27/2015  . Hyperglycemia 10/07/2015  . Productive cough 06/26/2015  . Fecal incontinence due to anorectal disorder 11/06/2014  . Health care maintenance 02/27/2014  . Hypokalemia 01/23/2014  . Malnutrition of moderate degree ([place]) 01/22/2014  . Normocytic anemia 01/21/2014  . CKD (chronic kidney disease) stage 3, GFR 30-59 ml/min 01/21/2014  . Allergic rhinitis 10/16/2013  . Thrombocytopenia ([place]) 09/08/2013  . Unspecified protein-calorie malnutrition 01/23/2013  . Infection of urinary tract 01/22/2013  . Decreased hearing 08/23/2012  . At high risk for falls 08/10/2012  . Age-related cognitive decline 08/10/2012  . GERD (gastroesophageal reflux disease) 06/15/2011  . Pacemaker-Medtronic 05/23/2011  . Chronic diastolic heart failure ([place])   . Hypertension   . Osteoporosis   . History of stroke   . Atrial fibrillation, controlled ([place])   . Incontinence overflow, stress female   . History of sick sinus syndrome   . B12 DEFICIENCY 11/05/2009    Past Surgical History:  Procedure Laterality Date  . ABDOMINAL HYSTERECTOMY    .  CATARACT EXTRACTION    . CHOLECYSTECTOMY    . COLONOSCOPY    . ESOPHAGOGASTRODUODENOSCOPY    . INSERT / REPLACE / REMOVE PACEMAKER  2000   MDT Sigma pacemaker implanted by Dr Lovena Le;  Marland Kitchen PACEMAKER GENERATOR CHANGE  10/2012   AdaptaL 10/2012 by Dr Lovena Le  . PACEMAKER GENERATOR CHANGE N/A 11/02/2012   Procedure: PACEMAKER GENERATOR CHANGE;  Surgeon: Evans Lance, MD;  Location: [facility] CATH LAB;  Service: Cardiovascular;  Laterality: N/A;    OB  History    No data available       Home Medications    Prior to Admission medications   Medication Sig Start Date End Date Taking? Authorizing Provider  aspirin EC 81 MG tablet Take 81 mg by mouth daily.   Yes Historical Provider, MD  diclofenac sodium (VOLTAREN) 1 % GEL Apply 2 g topically 4 (four) times daily. To left shoulder. Patient taking differently: Apply 2 g topically 4 (four) times daily as needed (pain). To left shoulder. 12/29/15  Yes Alexa Angela Burke, MD  furosemide (LASIX) 20 MG tablet Take 3 tabs (60mg ) every morning and 2 tabs (40mg ) every afternoon. 01/04/16  Yes Jule Ser, DO  lisinopril (PRINIVIL,ZESTRIL) 20 MG tablet TAKE 1 TABLET BY MOUTH DAILY 08/21/15  Yes Sid Falcon, MD  metoprolol (LOPRESSOR) 100 MG tablet TAKE 1 TABLET(100 MG) BY MOUTH TWICE DAILY 04/24/15  Yes Sid Falcon, MD  Nutritional Supplements ([place]) LIQD Take 237 mLs by mouth 3 (three) times daily. 12/19/14  Yes Zada Finders, MD  potassium chloride 20 MEQ/15ML (10%) SOLN TAKE 15 MLS BY MOUTH DAILY 11/03/15  Yes Sid Falcon, MD    Family History Family History  Problem Relation Age of [place]  . Diabetes Sister   . Stroke Sister   . Heart disease    . Heart disease Father     Social History Social History  Substance Use Topics  . Smoking status: Never Smoker  . Smokeless tobacco: Never Used  . Alcohol use 0.0 oz/week     Comment: occasionally.     Allergies   Patient has no known allergies.   Review of Systems Review of Systems  Constitutional: Positive for appetite change ( decreased) and fatigue.  Respiratory: Positive for shortness of breath.   Neurological: Positive for weakness ( generalized).  All other systems reviewed and are negative.    Physical Exam Updated Vital Signs BP 111/65 (BP Location: Left Arm)   Pulse 70   Temp 97.6 F (36.4 C) (Oral)   Resp 18   SpO2 97%   Physical Exam  Constitutional: She appears well-developed and  well-nourished. She appears distressed.  Awake, alert, rolling in bed  HENT:  Head: Normocephalic and atraumatic.  Mouth/Throat: Oropharynx is clear and moist. No oropharyngeal exudate.  Eyes: Conjunctivae are normal. No scleral icterus.  Neck: Normal range of motion. Neck supple. JVD present.  Cardiovascular: Normal rate, regular rhythm and intact distal pulses.   Pulmonary/Chest: Accessory muscle usage present. Tachypnea noted. No respiratory distress. She has no wheezes. She has rales ( throughout).  Equal chest expansion  Abdominal: Soft. Bowel sounds are normal. She exhibits no mass. There is no tenderness. There is no rebound and no guarding.  Musculoskeletal: Normal range of motion. She exhibits no edema.  Neurological: She is alert.  Speech is clear and goal oriented Moves extremities without ataxia  Skin: Skin is warm and dry. She is not diaphoretic.  Psychiatric: She has a normal  mood and affect.  Nursing note and vitals reviewed.    ED Treatments / Results  Labs (all labs ordered are listed, but only abnormal results are displayed) Labs Reviewed  BASIC METABOLIC PANEL - Abnormal; Notable for the following:       Result Value   Sodium 134 (*)    Potassium 5.5 (*)    CO2 18 (*)    BUN 36 (*)    Creatinine, Ser 2.39 (*)    GFR calc non Af Amer 17 (*)    GFR calc Af Amer 20 (*)    All other components within normal limits  CBC - Abnormal; Notable for the following:    Hemoglobin 11.8 (*)    HCT 35.0 (*)    RDW 16.7 (*)    Platelets 85 (*)    All other components within normal limits  BRAIN NATRIURETIC PEPTIDE - Abnormal; Notable for the following:    B Natriuretic Peptide >4,500.0 (*)    All other components within normal limits  PROTIME-INR - Abnormal; Notable for the following:    Prothrombin Time 21.8 (*)    All other components within normal limits  URINALYSIS, ROUTINE W REFLEX MICROSCOPIC (NOT AT [facility]) - Abnormal; Notable for the following:    Color, Urine  AMBER (*)    Bilirubin Urine SMALL (*)    Protein, ur 100 (*)    All other components within normal limits  URINE MICROSCOPIC-ADD ON - Abnormal; Notable for the following:    Squamous Epithelial / LPF 0-5 (*)    Bacteria, UA RARE (*)    Casts HYALINE CASTS (*)    All other components within normal limits  LACTIC ACID, PLASMA - Abnormal; Notable for the following:    Lactic Acid, Venous 5.9 (*)    All other components within normal limits  CULTURE, BLOOD (ROUTINE X 2)  CULTURE, BLOOD (ROUTINE X 2)  RESPIRATORY PANEL BY PCR  INFLUENZA PANEL BY PCR (TYPE A & B, H1N1)  LACTIC ACID, PLASMA  PROCALCITONIN  I-STAT TROPOININ, ED    EKG  EKG Interpretation  Date/Time:  Wednesday March 16 2016 05:43:40 EST Ventricular Rate:  82 PR Interval:    QRS Duration: 107 QT Interval:  428 QTC Calculation: 509 R Axis:   60 Text Interpretation:  Atrial fibrillation Ventricular premature complex Probable anterior infarct, age indeterminate Abnormal T, probable ischemia, widespread Lateral leads are also involved Prolonged QT interval No significant change since last tracing 28 Dec 2015 Confirmed by [facility]  MD-I, IVA ([id number]) on 03/16/2016 5:46:13 AM       Radiology Dg Chest 1 View  Result Date: 03/16/2016 CLINICAL DATA:  Shortness of breath for 2 days EXAM: CHEST 1 VIEW COMPARISON:  12/27/2015 FINDINGS: A left-sided single lead pacing device appears similar compared to the prior study. There is moderate cardiomegaly but no overt edema. There is atherosclerosis of the aorta. No acute consolidation. No pneumothorax. IMPRESSION: 1. No acute consolidations. 2. Stable cardiomegaly without overt failure Electronically Signed   By: Donavan Foil M.D.   On: 03/16/2016 02:45    Procedures Procedures (including critical care time)  Medications Ordered in ED Medications  ipratropium-albuterol (DUONEB) 0.5-2.5 (3) MG/3ML nebulizer solution 3 mL (3 mLs Nebulization Given 03/16/16 0606)  0.9 %  sodium  chloride infusion ( Intravenous New Bag/Given 03/16/16 0644)  diclofenac sodium (VOLTAREN) 1 % transdermal gel 2 g (not administered)  feeding supplement (ENSURE ENLIVE) (ENSURE ENLIVE) liquid 237 mL (not administered)  aspirin EC tablet 81  mg (not administered)  enoxaparin (LOVENOX) injection 30 mg (not administered)  sodium chloride flush (NS) 0.9 % injection 3 mL (not administered)  acetaminophen (TYLENOL) tablet 650 mg (not administered)    Or  acetaminophen (TYLENOL) suppository 650 mg (not administered)  polyethylene glycol (MIRALAX / GLYCOLAX) packet 17 g (not administered)  sodium chloride 0.9 % bolus 250 mL (not administered)  furosemide (LASIX) injection 40 mg (40 mg Intravenous Given 03/16/16 0420)     Initial Impression / Assessment and Plan / ED Course  I have reviewed the triage vital signs and the nursing notes.  Pertinent labs & imaging results that were available during my care of the patient were reviewed by me and considered in my medical decision making (see chart for details).  Clinical Course as of Mar 16 718  Wed Mar 16, 2016  0335 DG Chest 1 View [HM]  [phone] elevated Potassium: (!) 5.5 [HM]  0357 Elevated from last (sept 2017 - 1.2) Creatinine: (!) 2.39 [HM]  0357 B Natriuretic Peptide: (!) >4,500.0 [HM]  0358 Mild anemia Hemoglobin: (!) 11.8 [HM]  0401 Troponin i, poc: 0.04 [HM]  0413 Discussed with Dr. Juleen China who will admit. Tele- obs.    [HM]  0413 Pt with cold extremities. Blankets given.  RN to recheck in 15 min Temp: (!) 96.7 F (35.9 C) [HM]  0703 The patient was discussed with and seen by Dr. Rolland Porter who agrees with the treatment plan.   [HM]  [id number] Pt now with hypotension 93/51 and elevated lactic to 5.9.  Discussed with Dr. Inda Castle who will notify the primary team  [HM]  0715 Discussed with Dr. Randell Patient who is aware of pt's lactate, temperature and blood pressure.  Bair Hugger is on patient.  Pt is arousable without difficulty.    [HM]      Clinical Course User Index [HM] Jarrett Soho Thomas Mabry, PA-C    Pt with Fluid overload and increased respiratory effort. Labs show new AKI and hyperkalemia.  EKG with paced rhythm. Patient admitted for further evaluation and management. Lasix given as patient clinically has rales, JVD and accessory muscle usage in spite of a chest x-ray that does not appear to have pulmonary edema.  Pt admitted to Internal Medicine teaching service.     Final Clinical Impressions(s) / ED Diagnoses   Final diagnoses:  Cough  Acute on chronic congestive heart failure, unspecified congestive heart failure type ([place])  AKI (acute kidney injury) [facility])    New Prescriptions New Prescriptions   No medications on file     Abigail Butts, PA-C 03/16/16 0719    Rolland Porter, MD 03/16/16 [phone]

## 2016-03-16 NOTE — ED Provider Notes (Signed)
    EKG Interpretation  Date/Time:  Wednesday March 16 2016 05:43:40 EST Ventricular Rate:  82 PR Interval:    QRS Duration: 107 QT Interval:  428 QTC Calculation: 509 R Axis:   60 Text Interpretation:  Atrial fibrillation Ventricular premature complex Probable anterior infarct, age indeterminate Abnormal T, probable ischemia, widespread Lateral leads are also involved Prolonged QT interval No significant change since last tracing 28 Dec 2015 Confirmed by [facility]  MD-I, Connell Bognar ([id number]) on 03/16/2016 5:46:13 AM       Rolland Porter, MD, Barbette Or, MD 03/16/16 [phone]

## 2016-03-17 ENCOUNTER — Inpatient Hospital Stay (HOSPITAL_COMMUNITY): Payer: Commercial Managed Care - HMO

## 2016-03-17 DIAGNOSIS — J810 Acute pulmonary edema: Secondary | ICD-10-CM

## 2016-03-17 DIAGNOSIS — J449 Chronic obstructive pulmonary disease, unspecified: Secondary | ICD-10-CM

## 2016-03-17 DIAGNOSIS — J069 Acute upper respiratory infection, unspecified: Principal | ICD-10-CM

## 2016-03-17 DIAGNOSIS — E875 Hyperkalemia: Secondary | ICD-10-CM

## 2016-03-17 DIAGNOSIS — I272 Pulmonary hypertension, unspecified: Secondary | ICD-10-CM

## 2016-03-17 DIAGNOSIS — I5021 Acute systolic (congestive) heart failure: Secondary | ICD-10-CM

## 2016-03-17 DIAGNOSIS — I5023 Acute on chronic systolic (congestive) heart failure: Secondary | ICD-10-CM

## 2016-03-17 LAB — COMPREHENSIVE METABOLIC PANEL
ALBUMIN: 3.3 g/dL — AB (ref 3.5–5.0)
ALK PHOS: 116 U/L (ref 38–126)
ALT: 57 U/L — AB (ref 14–54)
AST: 195 U/L — ABNORMAL HIGH (ref 15–41)
Anion gap: 10 (ref 5–15)
BUN: 47 mg/dL — ABNORMAL HIGH (ref 6–20)
CALCIUM: 8.6 mg/dL — AB (ref 8.9–10.3)
CHLORIDE: 106 mmol/L (ref 101–111)
CO2: 18 mmol/L — AB (ref 22–32)
CREATININE: 2.89 mg/dL — AB (ref 0.44–1.00)
GFR calc non Af Amer: 14 mL/min — ABNORMAL LOW (ref >60)
GFR, EST AFRICAN AMERICAN: 16 mL/min — AB (ref >60)
GLUCOSE: 163 mg/dL — AB (ref 65–99)
Potassium: 5.2 mmol/L — ABNORMAL HIGH (ref 3.5–5.1)
SODIUM: 134 mmol/L — AB (ref 135–145)
Total Bilirubin: 2.2 mg/dL — ABNORMAL HIGH (ref 0.3–1.2)
Total Protein: 7.6 g/dL (ref 6.5–8.1)

## 2016-03-17 LAB — CBC
HCT: 32.7 % — ABNORMAL LOW (ref 36.0–46.0)
HEMOGLOBIN: 11.2 g/dL — AB (ref 12.0–15.0)
MCH: 30.4 pg (ref 26.0–34.0)
MCHC: 34.3 g/dL (ref 30.0–36.0)
MCV: 88.6 fL (ref 78.0–100.0)
PLATELETS: 77 10*3/uL — AB (ref 150–400)
RBC: 3.69 MIL/uL — AB (ref 3.87–5.11)
RDW: 16.5 % — ABNORMAL HIGH (ref 11.5–15.5)
WBC: 6.4 10*3/uL (ref 4.0–10.5)

## 2016-03-17 LAB — GLUCOSE, CAPILLARY
GLUCOSE-CAPILLARY: 146 mg/dL — AB (ref 65–99)
GLUCOSE-CAPILLARY: 152 mg/dL — AB (ref 65–99)
GLUCOSE-CAPILLARY: 167 mg/dL — AB (ref 65–99)
Glucose-Capillary: 156 mg/dL — ABNORMAL HIGH (ref 65–99)
Glucose-Capillary: 160 mg/dL — ABNORMAL HIGH (ref 65–99)

## 2016-03-17 LAB — LACTIC ACID, PLASMA
LACTIC ACID, VENOUS: 1.5 mmol/L (ref 0.5–1.9)
Lactic Acid, Venous: 3.3 mmol/L (ref 0.5–1.9)
Lactic Acid, Venous: 3.1 mmol/L (ref 0.5–1.9)

## 2016-03-17 LAB — MRSA PCR SCREENING: MRSA BY PCR: NEGATIVE

## 2016-03-17 MED ORDER — METOPROLOL TARTRATE 50 MG PO TABS
50.0000 mg | ORAL_TABLET | Freq: Two times a day (BID) | ORAL | Status: DC
  Administered 2016-03-17 (×2): 50 mg via ORAL
  Filled 2016-03-17 (×2): qty 1

## 2016-03-17 MED ORDER — IPRATROPIUM-ALBUTEROL 0.5-2.5 (3) MG/3ML IN SOLN
3.0000 mL | Freq: Three times a day (TID) | RESPIRATORY_TRACT | Status: DC
  Administered 2016-03-17 – 2016-03-18 (×4): 3 mL via RESPIRATORY_TRACT
  Filled 2016-03-17 (×3): qty 3

## 2016-03-17 NOTE — Progress Notes (Addendum)
CRITICAL VALUE ALERT  Critical value received:  LA 3.3 Date of notification:  03/17/2016   Time of notification:  0100  Critical value read back:Yes.    Nurse who received alert:  Monico Hoar   MD notified (1st page):  IMTS  Time of first page:  0130   MD notified (2nd page):  Time of second page:  Responding MD:  [place]   Time MD responded:

## 2016-03-17 NOTE — Progress Notes (Signed)
Subjective: Ms. Glickstein is sitting on the side of the bed and without any difficulty breathing today. She is conversational, alert and oriented, and interested in eating some breakfast and wants to "walk around." She says she will call her daughters this morning.   Objective: Vital signs in last 24 hours: Vitals:   03/16/16 2000 03/16/16 2020 03/17/16 0000 03/17/16 0400  BP:    (!) 142/82  Pulse:  62  95  Resp:  17  20  Temp:  97.7 F (36.5 C) 97.6 F (36.4 C) 97.6 F (36.4 C)  TempSrc:  Oral Oral Oral  SpO2:  96%  95%  Weight: 56.2 kg (124 lb)   56.2 kg (124 lb)  Height: 5\' 5"  (1.651 m)       Intake/Output Summary (Last 24 hours) at 03/17/16 0658 Last data filed at 03/17/16 0546  Gross per 24 hour  Intake          2048.34 ml  Output                0 ml  Net          2048.34 ml   Filed Weights   03/16/16 2000 03/17/16 0400  Weight: 56.2 kg (124 lb) 56.2 kg (124 lb)     Physical Exam General appearance: Elderly AA woman, sitting up in bed, conversational HENT: Normocephalic, atraumatic, tortuous neck veins, JVD Cardiovascular: Irregular rhythm and tachycardic rate (100-110), no murmurs, rubs, gallops Respiratory/Chest: Mild expiratory wheeze bilaterally, normal work of breathing Abdomen: Soft, non-tender, non-distended Ext: Thin, normal ROM, 1+ peripheral pulses, trace pitting edema to mid calves Skin: Warm, dry, intact Neuro: Cranial nerves grossly intact, alert and oriented Psych: Normal affect  Labs / Imaging / Procedures: CBC Latest Ref Rng & Units 03/17/2016 03/16/2016 01/04/2016  WBC 4.0 - 10.5 K/uL 6.4 5.3 3.8  Hemoglobin 12.0 - 15.0 g/dL 11.2(L) 11.8(L) -  Hematocrit 36.0 - 46.0 % 32.7(L) 35.0(L) 32.1(L)  Platelets 150 - 400 K/uL 77(L) 85(L) 152   BMP Latest Ref Rng & Units 03/17/2016 03/16/2016 03/16/2016  Glucose 65 - 99 mg/dL 163(H) 159(H) 79  BUN 6 - 20 mg/dL 47(H) 44(H) 36(H)  Creatinine 0.44 - 1.00 mg/dL 2.89(H) 2.75(H) 2.39(H)  BUN/Creat Ratio  12 - 28 - - -  Sodium 135 - 145 mmol/L 134(L) 135 134(L)  Potassium 3.5 - 5.1 mmol/L 5.2(H) 5.5(H) 5.5(H)  Chloride 101 - 111 mmol/L 106 106 102  CO2 22 - 32 mmol/L 18(L) 17(L) 18(L)  Calcium 8.9 - 10.3 mg/dL 8.6(L) 8.7(L) 9.4   Dg Chest 2 View  Result Date: 03/17/2016 CLINICAL DATA:  Shortness of breath and weakness EXAM: CHEST  2 VIEW COMPARISON:  03/16/2016 FINDINGS: Cardiac shadow remains enlarged. Pacing device is again seen. Aortic atherosclerotic change is again noted and stable. The lungs are well aerated bilaterally. Mild vascular congestion is noted without definitive interstitial edema. No effusion is noted. IMPRESSION: Mild congestive failure without interstitial edema. Electronically Signed   By: Inez Catalina M.D.   On: 03/17/2016 07:42   Assessment/Plan: Patient is an 82 yo F with a PMHx of HFrEF (30-35%), SSS, with single-lead pacemaker in place, chronic a-fib, and CKD stage 3 who presents with multiple complaints and general malaise from home.    Active Problems:   Atrial fibrillation, controlled (HCC)   Thrombocytopenia (HCC)   Systolic congestive heart failure (HCC)   AKI (acute kidney injury) (HCC)   Malaise and fatigue   SOB (shortness of breath)  Cough,  cardiogenic vs viral URI vs pneumonia, some hypotension yesterday with questionable increasing O2 req (poor waveform on pulse ox), had lactic acidosis to 5.9 >> 5.4 >> 5.5>> normalized today, had increasing dyspnea and productive cough at home, CXR with no clear infiltrate but significant cardiomegaly, procalcitonin 0.5, metapneumovirus positive on RVP. EKG - afib, ventricular paced, concern for anterior ischemic changes, prolonged QT. CXR with mild CHF this morning. - Follow TTE results - Blood cultures pending - Empiric vanc/zosyn per pharmacy, follow repeat procalcitonin to de-escalate, continue broad spectrum for 48-72 hrs - Appreciated CCM recommendations  AKI on CKD3, Cr 2.89 from baseline 1.2-1.3, likely  pre-renal in setting of recent poor po intake and hypotension - Trend BMPs - Encourage adequate hydration  Hypogylcemia, not DM or on insulin, possible sepsis vs capillary check artifact, BMP with glucose 79 at 1AM, CBG 44 at 10AM, improved to 115 with amp of D50, D5NS overnight - Q4H CBGs, may discontinue if stable  Mild Hyperkalemia: mild without symptoms, K 5.5 on admission, now 5.2 - Hold home potassium supplements - Follow BMET  HFrEF: BNP this admission >4500.  CXR with cardiomegaly, no clear infiltrate or pulmonary edema, weight 122 lbs up from baseline 114 lbs - no change in her orthopnea, peripheral edema is unchanged. Tortuous neck veins concern for JVD. TTE in Sept 2017 with LV EF 35-40%.  Moderate diffuse hypokinesis with no identifiable regional variations, severe MR and pulmonary HTN.  She has known right atrial enlargement with severely dilated right atrium on this recent Echo which could lead to dissociation of her BNP level from intravascular volume status.  - Follow TTE results - Resume home metoprolol at half dose 50 mg BID - Hold lasix for now  - TED hose - Daily weights, I/O's  A-Fib: Chronic, taken off warfarin a few years ago due to falls and bleeding risk. Some RVR this AM (11/30) - Resume home metoprolol at half dose 50mg  BID, if tolerated return to full dose tomorrow - Continue ASA 81 daily  - Telemetry  HTN: normotensive today  -  Hold home lisinopril 20 mg daily   Thrombocytopenia: Platelet count of 85 on admission consistent with previous results. Likely chronic as thrombocytopenia is listed under her problem list. No signs of active bleeding at this time. - Trend CBC  Deconditioning - PT eval and treat   FEN Fluid: None Electrolytes: replace as necessary Nutrition: HH diet  DVT PPx: Lovenox  Code status: DNR  Dispo: Anticipated discharge in approximately 1-2 day(s). If stable this afternoon with transfer to tele / floor.  LOS: 1 day    Asencion Partridge, MD 03/17/2016, 6:58 AM Pager: [phone]

## 2016-03-17 NOTE — Discharge Summary (Signed)
Name: [name] MRN: [id number] DOB: 12/09/1925 82 y.o. PCP: Sid Falcon, MD  Date of Admission: 03/16/2016  2:58 AM Date of Discharge: 03/21/2016 Attending Physician: Aldine Contes MD  Discharge Diagnosis: 1. Viral Upper Respiratory Infection 2. Acute Kidney Injury  Principal Problem:   Viral URI with cough Active Problems:   Atrial fibrillation, controlled (HCC)   Thrombocytopenia (HCC)   CKD (chronic kidney disease) stage 3, GFR 30-59 ml/min   AKI (acute kidney injury) ([place])   Malaise and fatigue   Discharge Medications:   Medication List    TAKE these medications   albuterol 108 (90 Base) MCG/ACT inhaler Commonly known as:  PROVENTIL HFA;VENTOLIN HFA Inhale 2 puffs into the lungs every 6 (six) hours as needed for wheezing or shortness of breath.   aspirin EC 81 MG tablet Take 81 mg by mouth daily.   azithromycin 250 MG tablet Commonly known as:  ZITHROMAX Take 2 pills for the first day, then 1 pill a day until you finish the bottle   cefPROZIL 500 MG tablet Commonly known as:  CEFZIL Take 1 tablet (500 mg total) by mouth 2 (two) times daily.   dextromethorphan-guaiFENesin 30-600 MG 12hr tablet Commonly known as:  MUCINEX DM Take 1 tablet by mouth 2 (two) times daily.   diclofenac sodium 1 % Gel Commonly known as:  VOLTAREN Apply 2 g topically 4 (four) times daily. To left shoulder. What changed:  when to take this  reasons to take this  additional instructions   [place] Take 237 mLs by mouth 3 (three) times daily.   furosemide 20 MG tablet Commonly known as:  LASIX Take 3 tabs (60mg ) every morning and 2 tabs (40mg ) every afternoon.   lisinopril 20 MG tablet Commonly known as:  PRINIVIL,ZESTRIL TAKE 1 TABLET BY MOUTH DAILY   metoprolol 100 MG tablet Commonly known as:  LOPRESSOR TAKE 1 TABLET(100 MG) BY MOUTH TWICE DAILY   polyethylene glycol packet Commonly known as:  MIRALAX / GLYCOLAX Take 17 g by  mouth daily as needed for mild constipation.   potassium chloride 20 MEQ/15ML (10%) Soln TAKE 15 MLS BY MOUTH DAILY       Disposition and follow-up:   [name] was discharged from [facility] in Stable condition.  At the hospital follow up visit please address:  1.  Viral URI - assess for cough and wheezing and antibiotic compliance  - AKI - assess renal function   2.  Labs / imaging needed at time of follow-up: BMP  3.  Pending labs/ test needing follow-up: none  Follow-up Appointments: Follow-up Information    [place] Go on 04/05/2016.   Why:  Hospital follow up appointment at 10:15, please arrive 15 minutes early to check in. Contact information: [address] [place] Follow up.   Why:  Home health PT arranged Contact information: [place] [address] [phone]        [facility] - [facility] Follow up.   Why:  rolling walker to be pickedup at [facility] information: [place] [address] [phone]           Hospital Course by problem list: Principal Problem:   Viral URI with cough Active Problems:   Atrial fibrillation, controlled (HCC)   Thrombocytopenia ([place])  CKD (chronic kidney disease) stage 3, GFR 30-59 ml/min   AKI (acute kidney injury) ([place])   Malaise and fatigue   1. Viral URI [name] is a 82 y/o woman with PMHx of sick sinus syndrome, single lead pacemaker, atrial fibrillation, HFrEF (LV EF 35-40% in Sept 2017), pulmonary HTN, CKD3, HTN, GERD, CVA in 2000 who presented on 11/29 following 3 days of cough, dyspnea, wheezing, weakness, appetite loss, and intermittent confusion. Initial labs remarkable for BNP > 4500, clear CXR, and lactic acid 5.9. Her presentation was initially concerning for septic shock as she developed hypotension  to 90s/50s , hypoglycemia to CBG 44, procalcitonin at 0.50, and apparent hypoxia on continuous pulse ox (but with poor waveform in retrospect). She was started on broad spectrum antibiotics and admitted to stepdown unit. She remained stable, breathing room air (with accurate SpO2 waveform) and received light IV fluids overnight and appeared at her baseline/asymptomatic by 11/30. Her respiratory viral panel resulted positive for metapneumovirus. She was stable for transfer to telemetry room on 11/30 with continued empiric antibiotics pending culture and repeat procalcitonin results.  On 12/1 CXR showed left lower lobe/peribronchial opacity suspicious for pneumonia and with no growth from blood cultures patient was started on ceftriaxone for CAP.  She was also treated with Duonebs for wheezing and Dextromrophan-guaifenesin for cough.  Her procalcitonin dropped to 0.14 and patient was discharged with azithromycin and cefprozil.      2. AKI, in setting of CKD 3 - presented with Cr 2.39 up from baseline 1.2-1.3 in setting of several days of poor po intake and likely dehydration. WIth IV fluid hydration her creatinine on discharge was 1.47.  3.  Hypoglycemia: patient's CBG was 44 on admission but did not not have episodes of hypoglycemia during the hospital course.   4. Hypotension:  Patient had blood pressures in the 80s/40s and was given IV NS fluid bolus with improvement.  5.  Chronic HFrEF:  BNP this admission >4500.  TTE in Sept 2017 with LV EF 35-40%. Echo was ordered during this admission and showed no significant interval change from prior study.  She was initially treated with lasix but was held after her creatinine had increased and her hypoxia was not thought to be due to CHF exacerbation.  6.  Chronic A-fib:  Taken off warfarin a few years ago due to falls and bleeding risk.  Currently rate is not controlled.  She was continued on her home metoprolol and ASA   7.  Thrombocytopenia:  Chronic.  Platelet count of 85 on admission consistent with previous results. Her current infection was likely contributing as well.  Discharge Vitals:   BP (!) 104/55 (BP Location: Left Arm)   Pulse 68   Temp 98.6 F (37 C)   Resp 18   Ht 5\' 5"  (1.651 m)   Wt 134 lb 1.6 oz (60.8 kg)   SpO2 100%   BMI 22.32 kg/m   Pertinent Labs, Studies, and Procedures:  CBC Latest Ref Rng & Units 03/19/2016 03/18/2016 03/17/2016  WBC 4.0 - 10.5 K/uL 6.1 7.3 6.4  Hemoglobin 12.0 - 15.0 g/dL 11.1(L) 12.3 11.2(L)  Hematocrit 36.0 - 46.0 % 31.8(L) 34.5(L) 32.7(L)  Platelets 150 - 400 K/uL 80(L) 87(L) 77(L)   CMP Latest Ref Rng & Units 03/19/2016 03/18/2016 03/17/2016  Glucose 65 - 99 mg/dL 145(H) 194(H) 163(H)  BUN 6 - 20 mg/dL 29(H) 36(H) 47(H)  Creatinine 0.44 - 1.00 mg/dL 1.47(H) 2.02(H) 2.89(H)  Sodium 135 - 145 mmol/L 138 136  134(L)  Potassium 3.5 - 5.1 mmol/L 3.9 3.9 5.2(H)  Chloride 101 - 111 mmol/L 105 100(L) 106  CO2 22 - 32 mmol/L 27 22 18(L)  Calcium 8.9 - 10.3 mg/dL 8.7(L) 8.2(L) 8.6(L)  Total Protein 6.5 - 8.1 g/dL - 7.2 7.6  Total Bilirubin 0.3 - 1.2 mg/dL - 2.1(H) 2.2(H)  Alkaline Phos 38 - 126 U/L - 92 116  AST 15 - 41 U/L - 181(H) 195(H)  ALT 14 - 54 U/L - 61(H) 57(H)   Respiratory Panel by PCR  Order: [id number]  Status:  Final result Visible to patient:  No (Not Released) Next appt:  03/23/2016 at 04:30 PM in Audiology [facility], [facility], AUD)   Ref Range & Units 1d ago  Adenovirus NOT DETECTED NOT DETECTED   Coronavirus 229E NOT DETECTED NOT DETECTED   Coronavirus HKU1 NOT DETECTED NOT DETECTED   Coronavirus NL63 NOT DETECTED NOT DETECTED   Coronavirus OC43 NOT DETECTED NOT DETECTED   Metapneumovirus NOT DETECTED DETECTED    Rhinovirus / Enterovirus NOT DETECTED NOT DETECTED   Influenza A NOT DETECTED NOT DETECTED   Influenza B NOT DETECTED NOT DETECTED   Parainfluenza Virus 1 NOT DETECTED NOT DETECTED   Parainfluenza Virus 2 NOT DETECTED NOT DETECTED   Parainfluenza  Virus 3 NOT DETECTED NOT DETECTED   Parainfluenza Virus 4 NOT DETECTED NOT DETECTED   Respiratory Syncytial Virus NOT DETECTED NOT DETECTED   Bordetella pertussis NOT DETECTED NOT DETECTED   Chlamydophila pneumoniae NOT DETECTED NOT DETECTED   Mycoplasma pneumoniae NOT DETECTED NOT DETECTED        Dg Chest 1 View  Result Date: 03/16/2016 CLINICAL DATA:  Shortness of breath for 2 days EXAM: CHEST 1 VIEW COMPARISON:  12/27/2015 FINDINGS: A left-sided single lead pacing device appears similar compared to the prior study. There is moderate cardiomegaly but no overt edema. There is atherosclerosis of the aorta. No acute consolidation. No pneumothorax. IMPRESSION: 1. No acute consolidations. 2. Stable cardiomegaly without overt failure Electronically Signed   By: Donavan Foil M.D.   On: 03/16/2016 02:45   Dg Chest 2 View  Result Date: 03/17/2016 CLINICAL DATA:  Shortness of breath and weakness EXAM: CHEST  2 VIEW COMPARISON:  03/16/2016 FINDINGS: Cardiac shadow remains enlarged. Pacing device is again seen. Aortic atherosclerotic change is again noted and stable. The lungs are well aerated bilaterally. Mild vascular congestion is noted without definitive interstitial edema. No effusion is noted. IMPRESSION: Mild congestive failure without interstitial edema. Electronically Signed   By: Inez Catalina M.D.   On: 03/17/2016 07:42   Result Date: 03/18/2016 CLINICAL DATA:  82 year old female with cough. Recent shortness of Breath and weakness. Initial encounter. EXAM: CHEST  2 VIEW COMPARISON:  03/17/2016 and earlier.  FINDINGS: Stable cardiomegaly and mediastinal contours. Stable left chest cardiac pacemaker. Stable small bilateral pleural effusions. No pneumothorax. Decreased pulmonary vascularity. No overt edema. Patchy asymmetric medial left lung base and peribronchial opacity. No other confluent pulmonary opacity. Calcified aortic atherosclerosis. Visualized tracheal air column is within normal  limits. Osteopenia. No acute osseous abnormality identified.  IMPRESSION: 1. Confluent left lower lobe/peribronchial opacity now suspicious for pneumonia. 2. Stable small bilateral pleural effusions. 3. Regressed pulmonary vascularity with no acute edema. 4. Severe cardiomegaly.  Calcified aortic atherosclerosis.  Discharge Instructions: Discharge Instructions    AMB Referral to [place] Management    Complete by:  As directed    Please assign to [place] for high risk. Written consent obtained. Recently active with [facility]  Care Management. Please see notes for details. [name] Russell-daughter- primary contact person post discharge at [phone]. To discharge home with home health possibly today 03/21/16.Thanks. Marthenia Rolling, [place], RN,BSN [facility] [id number]   Reason for consult:  Please assign to [place] - high risk   Expected date of contact:  1-3 days (reserved for hospital discharges)   Diet - low sodium heart healthy    Complete by:  As directed    Increase activity slowly    Complete by:  As directed       Signed: Valinda Party, DO 03/26/2016, 5:32 PM   Pager: [phone]

## 2016-03-17 NOTE — Progress Notes (Signed)
Name: [name] MRN: [id number] DOB: [birth year]    ADMISSION DATE:  03/16/2016 CONSULTATION DATE:  Graciella Freer, Dr Rene Kocher MD :  11/29  CHIEF COMPLAINT:  Lactic acidosis, mild distress BRIEF PATIENT DESCRIPTION: 82 yr old chf, pulm HTN, dementia presented with weakness   HISTORY OF PRESENT ILLNESS: 82 yr old AAF with  hypertension, chronic systolic heart failure with ejection fraction 35% percent on echocardiogram in September 2017, pulmonary hypertension likely secondary to that, sick sinus syndrome status post permanent pacemaker, chronic atrial fibrillation on aspirin thromboprophylaxis, cerebrovascular disease with prior stroke, and chronic renal insufficiency stage II with baseline creatinine 1.3, GFR 40. She presents with an approximate 3 day history of increasing weakness, mild confusion, intermittent cough, no sig sputum, wheezing, and dyspnea. Subjective fever and chills.  To ER, treated by IMTS for concerns sepsis, was YPOTHERMIC in ER and increasing SOB. Called for assessment for this and lactic flat.   SUBJECTIVE: No events overnight, continues to be SOB.  VITAL SIGNS: Temp:  [97.6 F (36.4 C)-98.1 F (36.7 C)] 97.6 F (36.4 C) (11/30 0400) Pulse Rate:  [49-159] 95 (11/30 0400) Resp:  [11-27] 20 (11/30 0400) BP: (102-142)/(41-94) 142/82 (11/30 0400) SpO2:  [92 %-100 %] 95 % (11/30 0400) Weight:  [124 lb (56.2 kg)] 124 lb (56.2 kg) (11/30 0400)  PHYSICAL EXAMINATION: General:  frail Neuro:  fc well, O x 4, nonfocal, weak HEENT:  JVD significant Cardiovascular:  s1 s2 RRR rt heeve, m Lungs:  Cta, mild ronchi anterior with cough Abdomen:  Soft, BS wnl, no r/g Musculoskeletal:  Mild edema  Skin:  No rash   Recent Labs Lab 03/16/16 0146 03/16/16 1503 03/17/16 0000  NA 134* 135 134*  K 5.5* 5.5* 5.2*  CL 102 106 106  CO2 18* 17* 18*  BUN 36* 44* 47*  CREATININE 2.39* 2.75* 2.89*  GLUCOSE 79 159* 163*    Recent Labs Lab 03/16/16 0146  03/17/16 0000  HGB 11.8* 11.2*  HCT 35.0* 32.7*  WBC 5.3 6.4  PLT 85* 77*   Dg Chest 1 View  Result Date: 03/16/2016 CLINICAL DATA:  Shortness of breath for 2 days EXAM: CHEST 1 VIEW COMPARISON:  12/27/2015 FINDINGS: A left-sided single lead pacing device appears similar compared to the prior study. There is moderate cardiomegaly but no overt edema. There is atherosclerosis of the aorta. No acute consolidation. No pneumothorax. IMPRESSION: 1. No acute consolidations. 2. Stable cardiomegaly without overt failure Electronically Signed   By: Donavan Foil M.D.   On: 03/16/2016 02:45   Dg Chest 2 View  Result Date: 03/17/2016 CLINICAL DATA:  Shortness of breath and weakness EXAM: CHEST  2 VIEW COMPARISON:  03/16/2016 FINDINGS: Cardiac shadow remains enlarged. Pacing device is again seen. Aortic atherosclerotic change is again noted and stable. The lungs are well aerated bilaterally. Mild vascular congestion is noted without definitive interstitial edema. No effusion is noted. IMPRESSION: Mild congestive failure without interstitial edema. Electronically Signed   By: Inez Catalina M.D.   On: 03/17/2016 07:42    ASSESSMENT / PLAN:  Mild to moderate resp failure Lactic acidosis ( unclear cause, r/o sepsis vs cardiac)resolved 11/30  R/o PNA, not currently evident, not supported via cxr, wbc 6.4, temp 97.6 On V/Z per primary  emp ric   + meta pneumovirus 11/29  pulm HTN, severe Check 2d  CHF bnp >45k, cxr with edema Careful with fluids  DNR per family pt requests  Richardson Landry Minor ACNP Maryanna Shape PCCM Pager [phone]  till 3 pm If no answer page [phone]  Attending Note:  82 year old female DNR status with elevated BNP and SOB and borderline BP.  Patient does not meet code sepsis criteria and is DNR.  Most likely pulmonary edema.  On exam, well perfused, bibasilar crackles.  I reviewed CXR myself, pulmonary edema evident.  Discussed with PCCM-NP.  Hypoxemia: due to pulmonary edema  -  Titrate O2 for sat of 88-92%.  - Treat as below.  Acute pulmonary edema:  - KVO IVF  - Lasix   Pulmonary HTN:  - 2D echo pending.  - Hold treatment for now  Pneumonia:  - Vanc/zosyn  - F/u on culture  PCCM will sign off, please call back if needed.  Patient seen and examined, agree with above note.  I dictated the care and orders written for this patient under my direction.  Rush Farmer, MD [phone]  03/17/2016, 8:13 AM

## 2016-03-17 NOTE — Progress Notes (Signed)
CRITICAL VALUE ALERT  Critical value received:  LA 3.1  Date of notification:  03/17/2016   Time of notification:  4:16 AM   Critical value read back:Yes.    Nurse who received alert:  Monico Hoar   MD notified (1st page):  molt  Time of first page:  4:16 AM   MD notified (2nd page):  Time of second page:  Responding MD:  Molt   Time MD responded:  4:16 AM

## 2016-03-17 NOTE — Progress Notes (Signed)
Medicine attending: I personally examined this patient today together with resident physician Dr. Asencion Partridge and I concur with his evaluation and measuring plan which we discussed together.  Her condition has stabilized overnight. Blood pressure normalized. She is alert and oriented to person and place. Minimal tachycardia. Bilateral expiratory wheezes less compared with exam yesterday. Lactic acid now in normal range. No further episodes of hypoglycemia. Renal function slightly worse No anion gap. Potassium borderline increased at 5.2. Persistent moderate transaminase and bilirubin elevations. Platelets slightly lower at 77,000. Metapneumovirus virus isolated from initial respiratory swab Blood cultures negative so far.  Impression: #1. Septicemia source of infection not identified-continue broad-spectrum anti- coverage until cultures mature. In view of her age, clinical presentation and labs, and the fact that at least 30% of septic episodes are culture negative, at that she should be treated for 7-10 days total.  #2. Concomitant viral respiratory infection  #3. Chronic atrial fibrillation. Resuming beta blocker now that blood pressure has stabilized and heart rate is increased.  #4. Acute on chronic systolic heart failure Given her history I am not surprised that she is in mild failure, however, I feel she was clinically dry on presentation despite the significant elevation of her BNP. She had an increased BUN/creatinine ratio. Dry oral mucous membranes. Borderline blood pressure. Fall in blood pressure after a dose of furosemide. We are being careful with her fluids.

## 2016-03-18 ENCOUNTER — Inpatient Hospital Stay (HOSPITAL_COMMUNITY): Payer: Commercial Managed Care - HMO

## 2016-03-18 DIAGNOSIS — J123 Human metapneumovirus pneumonia: Secondary | ICD-10-CM

## 2016-03-18 DIAGNOSIS — Z79899 Other long term (current) drug therapy: Secondary | ICD-10-CM

## 2016-03-18 DIAGNOSIS — I369 Nonrheumatic tricuspid valve disorder, unspecified: Secondary | ICD-10-CM

## 2016-03-18 DIAGNOSIS — R5383 Other fatigue: Secondary | ICD-10-CM

## 2016-03-18 DIAGNOSIS — Z66 Do not resuscitate: Secondary | ICD-10-CM

## 2016-03-18 DIAGNOSIS — N183 Chronic kidney disease, stage 3 (moderate): Secondary | ICD-10-CM

## 2016-03-18 LAB — CBC
HCT: 34.5 % — ABNORMAL LOW (ref 36.0–46.0)
Hemoglobin: 12.3 g/dL (ref 12.0–15.0)
MCH: 30.8 pg (ref 26.0–34.0)
MCHC: 35.7 g/dL (ref 30.0–36.0)
MCV: 86.5 fL (ref 78.0–100.0)
PLATELETS: 87 10*3/uL — AB (ref 150–400)
RBC: 3.99 MIL/uL (ref 3.87–5.11)
RDW: 16.3 % — AB (ref 11.5–15.5)
WBC: 7.3 10*3/uL (ref 4.0–10.5)

## 2016-03-18 LAB — COMPREHENSIVE METABOLIC PANEL
ALBUMIN: 2.6 g/dL — AB (ref 3.5–5.0)
ALT: 61 U/L — ABNORMAL HIGH (ref 14–54)
ANION GAP: 14 (ref 5–15)
AST: 181 U/L — ABNORMAL HIGH (ref 15–41)
Alkaline Phosphatase: 92 U/L (ref 38–126)
BUN: 36 mg/dL — ABNORMAL HIGH (ref 6–20)
CALCIUM: 8.2 mg/dL — AB (ref 8.9–10.3)
CHLORIDE: 100 mmol/L — AB (ref 101–111)
CO2: 22 mmol/L (ref 22–32)
CREATININE: 2.02 mg/dL — AB (ref 0.44–1.00)
GFR, EST AFRICAN AMERICAN: 24 mL/min — AB (ref >60)
GFR, EST NON AFRICAN AMERICAN: 21 mL/min — AB (ref >60)
Glucose, Bld: 194 mg/dL — ABNORMAL HIGH (ref 65–99)
Potassium: 3.9 mmol/L (ref 3.5–5.1)
Sodium: 136 mmol/L (ref 135–145)
Total Bilirubin: 2.1 mg/dL — ABNORMAL HIGH (ref 0.3–1.2)
Total Protein: 7.2 g/dL (ref 6.5–8.1)

## 2016-03-18 LAB — ECHOCARDIOGRAM COMPLETE
Height: 65 in
Weight: 2000 oz

## 2016-03-18 LAB — GLUCOSE, CAPILLARY
GLUCOSE-CAPILLARY: 100 mg/dL — AB (ref 65–99)
GLUCOSE-CAPILLARY: 104 mg/dL — AB (ref 65–99)
GLUCOSE-CAPILLARY: 140 mg/dL — AB (ref 65–99)
Glucose-Capillary: 130 mg/dL — ABNORMAL HIGH (ref 65–99)
Glucose-Capillary: 131 mg/dL — ABNORMAL HIGH (ref 65–99)

## 2016-03-18 LAB — CK: Total CK: 531 U/L — ABNORMAL HIGH (ref 38–234)

## 2016-03-18 LAB — PROCALCITONIN: PROCALCITONIN: 0.58 ng/mL

## 2016-03-18 MED ORDER — VANCOMYCIN HCL 500 MG IV SOLR
500.0000 mg | INTRAVENOUS | Status: DC
  Administered 2016-03-18: 500 mg via INTRAVENOUS
  Filled 2016-03-18: qty 500

## 2016-03-18 MED ORDER — PIPERACILLIN-TAZOBACTAM 3.375 G IVPB
3.3750 g | Freq: Three times a day (TID) | INTRAVENOUS | Status: DC
  Filled 2016-03-18 (×2): qty 50

## 2016-03-18 MED ORDER — ALBUTEROL SULFATE (2.5 MG/3ML) 0.083% IN NEBU: 2.5000 mg | INHALATION_SOLUTION | RESPIRATORY_TRACT | Status: DC | PRN

## 2016-03-18 MED ORDER — DEXTROMETHORPHAN POLISTIREX ER 30 MG/5ML PO SUER
30.0000 mg | Freq: Four times a day (QID) | ORAL | Status: DC | PRN
Start: 2016-03-18 — End: 2016-03-18
  Filled 2016-03-18: qty 5

## 2016-03-18 MED ORDER — IPRATROPIUM-ALBUTEROL 0.5-2.5 (3) MG/3ML IN SOLN
3.0000 mL | Freq: Four times a day (QID) | RESPIRATORY_TRACT | Status: DC
  Administered 2016-03-18: 3 mL via RESPIRATORY_TRACT
  Filled 2016-03-18 (×2): qty 3

## 2016-03-18 MED ORDER — ENOXAPARIN SODIUM 30 MG/0.3ML ~~LOC~~ SOLN
30.0000 mg | SUBCUTANEOUS | Status: DC
  Administered 2016-03-19 – 2016-03-21 (×3): 30 mg via SUBCUTANEOUS
  Filled 2016-03-18 (×3): qty 0.3

## 2016-03-18 MED ORDER — ENOXAPARIN SODIUM 30 MG/0.3ML ~~LOC~~ SOLN: 30.0000 mg | SUBCUTANEOUS | Status: DC

## 2016-03-18 MED ORDER — METOPROLOL TARTRATE 50 MG PO TABS
50.0000 mg | ORAL_TABLET | Freq: Once | ORAL | Status: AC
  Administered 2016-03-18: 50 mg via ORAL

## 2016-03-18 MED ORDER — SODIUM CHLORIDE 0.9 % IV BOLUS (SEPSIS)
500.0000 mL | Freq: Once | INTRAVENOUS | Status: AC
  Administered 2016-03-18: 500 mL via INTRAVENOUS

## 2016-03-18 MED ORDER — METOPROLOL TARTRATE 100 MG PO TABS
100.0000 mg | ORAL_TABLET | Freq: Two times a day (BID) | ORAL | Status: DC
Start: 2016-03-18 — End: 2016-03-21
  Administered 2016-03-18 – 2016-03-21 (×4): 100 mg via ORAL
  Filled 2016-03-18 (×8): qty 1

## 2016-03-18 MED ORDER — IPRATROPIUM-ALBUTEROL 0.5-2.5 (3) MG/3ML IN SOLN
3.0000 mL | Freq: Three times a day (TID) | RESPIRATORY_TRACT | Status: DC
  Administered 2016-03-19 – 2016-03-21 (×8): 3 mL via RESPIRATORY_TRACT
  Filled 2016-03-18 (×7): qty 3

## 2016-03-18 MED ORDER — DEXTROMETHORPHAN POLISTIREX ER 30 MG/5ML PO SUER
30.0000 mg | Freq: Two times a day (BID) | ORAL | Status: DC | PRN
  Administered 2016-03-18 – 2016-03-19 (×2): 30 mg via ORAL
  Filled 2016-03-18 (×3): qty 5

## 2016-03-18 MED ORDER — DEXTROSE 5 % IV SOLN
1.0000 g | INTRAVENOUS | Status: DC
  Administered 2016-03-18 – 2016-03-21 (×4): 1 g via INTRAVENOUS
  Filled 2016-03-18 (×4): qty 10

## 2016-03-18 NOTE — Progress Notes (Signed)
  Echocardiogram 2D Echocardiogram has been performed.  [name] 03/18/2016, 3:57 PM

## 2016-03-18 NOTE — Progress Notes (Addendum)
Notified on call Dr. Hetty Ely regarding patient BP of 97/41 and heart rate of 79. Dr. Hetty Ely ordered one time dose of 50MG  of Metoprolol instead of the scheduled dose of 100MG  of Metoprolol.  Will continue to monitor patient and notify as needed

## 2016-03-18 NOTE — Evaluation (Signed)
Physical Therapy Evaluation Patient Details Name: [name] MRN: [id number] DOB: 12-18-1925 Today's Date: 03/18/2016   History of Present Illness  Pt adm with sepsis of unknown origin. PMH - dementia, afib, chf  Clinical Impression  Pt admitted with above diagnosis and presents to PT with functional limitations due to deficits listed below (See PT problem list). Pt needs skilled PT to maximize independence and safety to allow discharge to home with HHPT and intermittent support of family. Expect pt's mobility will return to her baseline fairly quickly.     Follow Up Recommendations Home health PT;Supervision - Intermittent    Equipment Recommendations  Rolling walker with 5" wheels    Recommendations for Other Services       Precautions / Restrictions Precautions Precautions: Fall Restrictions Weight Bearing Restrictions: No      Mobility  Bed Mobility Overal bed mobility: Needs Assistance Bed Mobility: Supine to Sit     Supine to sit: Min assist     General bed mobility comments: Assist to elevate trunk into sitting  Transfers Overall transfer level: Needs assistance Equipment used: 1 person hand held assist;Rolling walker (2 wheeled) Transfers: Sit to/from Stand Sit to Stand: Min assist         General transfer comment: Assist to bring hips up  Ambulation/Gait Ambulation/Gait assistance: Min guard;Min assist Ambulation Distance (Feet): 40 Feet Assistive device: Rolling walker (2 wheeled);1 person hand held assist Gait Pattern/deviations: Step-through pattern;Decreased step length - right;Decreased step length - left;Shuffle;Narrow base of support Gait velocity: decr Gait velocity interpretation: <1.8 ft/sec, indicative of risk for recurrent falls General Gait Details: Slightly unsteady gait which was improved with the use of walker  Stairs            Wheelchair Mobility    Modified Rankin (Stroke Patients Only)       Balance Overall  balance assessment: Needs assistance Sitting-balance support: No upper extremity supported;Feet supported Sitting balance-Leahy Scale: Good     Standing balance support: Single extremity supported Standing balance-Leahy Scale: Poor Standing balance comment: UE support needed                             Pertinent Vitals/Pain Pain Assessment: No/denies pain    Home Living Family/patient expects to be discharged to:: Private residence Living Arrangements: Alone Available Help at Discharge: Family;Available PRN/intermittently Type of Home: House Home Access: Level entry     Home Layout: One level Home Equipment: Cane - single point;Bedside commode Additional Comments: pt reports she has 8 children and a daughter lives close by. pt reports she does not drive and that children get all the groceries and drive her to appointments. pt does all her household chores and cooks all her meals    Prior Function Level of Independence: Independent with assistive device(s)         Comments: children come by daily and wash pts clothes, clean house.     Hand Dominance   Dominant Hand: Right    Extremity/Trunk Assessment   Upper Extremity Assessment: Generalized weakness           Lower Extremity Assessment: Generalized weakness         Communication   Communication: HOH  Cognition Arousal/Alertness: Awake/alert Behavior During Therapy: WFL for tasks assessed/performed Overall Cognitive Status: History of cognitive impairments - at baseline  General Comments      Exercises     Assessment/Plan    PT Assessment Patient needs continued PT services  PT Problem List Decreased strength;Decreased activity tolerance;Decreased balance;Decreased mobility;Decreased knowledge of use of DME          PT Treatment Interventions DME instruction;Gait training;Functional mobility training;Therapeutic activities;Therapeutic exercise;Balance  training;Patient/family education    PT Goals (Current goals can be found in the Care Plan section)  Acute Rehab PT Goals Patient Stated Goal: go home PT Goal Formulation: With patient Time For Goal Achievement: 03/25/16 Potential to Achieve Goals: Good    Frequency Min 3X/week   Barriers to discharge Decreased caregiver support Lives alone but has family support    Co-evaluation               End of Session Equipment Utilized During Treatment: Gait belt Activity Tolerance: Patient tolerated treatment well Patient left: in chair;with call bell/phone within reach;with chair alarm set;with family/visitor present Nurse Communication: Mobility status         Time: [id number] PT Time Calculation (min) (ACUTE ONLY): 20 min   Charges:   PT Evaluation $PT Eval Moderate Complexity: 1 Procedure     PT G CodesShary Decamp Maycok 2016-03-28, 4:05 PM [employer] [place]

## 2016-03-18 NOTE — Progress Notes (Signed)
Pharmacy Antibiotic Note [name] is a 82 y.o. female admitted on 03/16/2016 that is currently on day 3 of Zosyn and vancomycin for treatment of culture negative sepsis in setting of concomitant viral respiratory infection and AKI.  SCr improving 2.02 < 2.89 with CrCl ~ 20 ml/min  Plan: 1. Adjust Zosyn to 3.375 grams IV every 8 hours (infused over 4 hours) 2. Vancomycin 500 mg IV every 24 hours  3. Would scale back antibiotics as feasible   Height: 5\' 5"  (165.1 cm) Weight: 125 lb (56.7 kg) IBW/kg (Calculated) : 57  Temp (24hrs), Avg:97.6 F (36.4 C), Min:97.4 F (36.3 C), Max:98 F (36.7 C)   Recent Labs Lab 03/16/16 0146  03/16/16 1503 03/16/16 1808 03/16/16 2114 03/17/16 0000 03/17/16 0239 03/17/16 0556 03/18/16 0537 03/18/16 0732  WBC 5.3  --   --   --   --  6.4  --   --  7.3  --   CREATININE 2.39*  --  2.75*  --   --  2.89*  --   --   --  2.02*  LATICACIDVEN  --   < > 4.4* 3.4* 3.8* 3.3* 3.1* 1.5  --   --   < > = values in this interval not displayed.  Estimated Creatinine Clearance: 17.2 mL/min (by C-G formula based on SCr of 2.02 mg/dL (H)).    No Known Allergies  Antimicrobials this admission:  11/29 Vanc>> 11/29 Zosyn>>  Dose adjustments this admission:  N/a  Microbiology results:  11/29 >>MRSA neg 11/29 BC x2>> ngtd 11/29 Nasopharyngeal swab - metapneumovirus  Thank you for allowing pharmacy to be a part of this patient's care.  Duayne Cal 03/18/2016 9:35 AM

## 2016-03-18 NOTE — Progress Notes (Addendum)
Initial Nutrition Assessment  DOCUMENTATION CODES:   Not applicable  INTERVENTION:    Ensure Enlive po BID, each supplement provides 350 kcal and 20 grams of protein  NUTRITION DIAGNOSIS:   Increased nutrient needs related to acute illness as evidenced by estimated needs  GOAL:   Patient will meet greater than or equal to 90% of their needs  MONITOR:   PO intake, Supplement acceptance, Labs, Weight trends, Skin, I & O's  REASON FOR ASSESSMENT:   Malnutrition Screening Tool  ASSESSMENT:   82 yr old AAF with  hypertension, chronic systolic heart failure with ejection fraction 35% percent on echocardiogram in September 2017, pulmonary hypertension likely secondary to that, sick sinus syndrome status post permanent pacemaker, chronic atrial fibrillation on aspirin thromboprophylaxis, cerebrovascular disease with prior stroke, and chronic renal insufficiency stage II with baseline creatinine 1.3, GFR 40. She presents with an approximate 3 day history of increasing weakness, mild confusion, intermittent cough, no sig sputum, wheezing, and dyspnea  Pt's PO intake variable at 40-50% per flowsheet records. Has Ensure Enlive nutrition supplements ordered BID. Per readings below, weight has been stable. Labs and medications reviewed. CBG's 131-100-104.  RD unable to complete Nutrition Focused Physical Exam at this time.  Diet Order:  Diet Heart Room service appropriate? Yes; Fluid consistency: Thin  Skin:  Reviewed, no issues  Last BM:  12/1  Height:   Ht Readings from Last 1 Encounters:  03/16/16 5\' 5"  (1.651 m)    Weight:   Wt Readings from Last 1 Encounters:  03/18/16 125 lb (56.7 kg)   Wt Readings from Last 15 Encounters:  03/18/16 125 lb (56.7 kg)  02/17/16 122 lb 3.2 oz (55.4 kg)  01/28/16 120 lb (54.4 kg)  01/04/16 128 lb (58.1 kg)  12/29/15 123 lb 9.6 oz (56.1 kg)  12/17/15 125 lb 4.8 oz (56.8 kg)  10/07/15 121 lb 11.2 oz (55.2 kg)  08/06/15 114 lb 3.2 oz  (51.8 kg)  06/26/15 118 lb 14.4 oz (53.9 kg)  05/20/15 123 lb 9.6 oz (56.1 kg)  03/26/15 124 lb 12.8 oz (56.6 kg)  03/15/15 116 lb 3.2 oz (52.7 kg)  03/04/15 124 lb (56.2 kg)  12/19/14 117 lb 6.4 oz (53.3 kg)  12/16/14 114 lb 3.2 oz (51.8 kg)    Ideal Body Weight:  56.8 kg  BMI:  Body mass index is 20.8 kg/m.  Estimated Nutritional Needs:   Kcal:  1400-1600  Protein:  70-80 gm  Fluid:  >/= 1.5 L  EDUCATION NEEDS:   No education needs identified at this time  Arthur Holms, RD, LDN Pager #: [phone] After-Hours Pager #: [phone]

## 2016-03-18 NOTE — Progress Notes (Signed)
   Subjective: Patient was evaluated this morning on rounds. She was laying comfortably in bed and states that her breathing has improved. She continues to have a productive cough. She denies shortness of breath, chest pain or palpitations.  Objective:  Vital signs in last 24 hours: Vitals:   03/18/16 0937 03/18/16 1000 03/18/16 1115 03/18/16 1402  BP: 114/68 124/74 (!) 83/45 97/65  Pulse: (!) 126 (!) 127 65 (!) 102  Resp:  (!) 24 18 20   Temp:   98 F (36.7 C) 98.4 F (36.9 C)  TempSrc:    Oral  SpO2:  96%    Weight:      Height:       Physical Exam  Constitutional: No distress.  Cardiovascular:  Irregularly Irregular  Pulmonary/Chest: Effort normal. She has wheezes.  Rhonci noted in the right upper back Wheezes noted throughout all lung fields    Assessment/Plan:  Principal Problem:   Viral URI with cough Active Problems:   Atrial fibrillation, controlled (HCC)   Thrombocytopenia (HCC)   CKD (chronic kidney disease) stage 3, GFR 30-59 ml/min   AKI (acute kidney injury) ([place])   Malaise and fatigue  Cough Metapneumovirus positive on RVP.  Patient continues to have a productive cough.  CXR shows left lower lobe/peribronchial opacity suspicious for pneumonia. On physical exam wheezing was noted throughout all lung fields and rhonchi noted in the right upper back. Procalcitonin today is 0.58, previously 0.50. Will start ceftriaxone for possible CAP. Blood cultures show no growth for 2 days and Vanc and zosyn will be discontinued today.  - Blood cultures NGTD - vanc/zosyn discontinued  - Start ceftriaxone for possible CAP - Duonebs Q6 - Albuterol PRN - Delsym   AKI on CKD3 Cr is improving.  It is currently 2.02 and has decreased from 2.89 previously.  Baseline 1.2-1.3, likely pre-renal in setting of recent poor po intake and hypotension.  CK was elevated, likely due from hypoperfusion, at 531 and may have contributed to kidney injury as well. - Trend BMPs - Encourage  adequate hydration  Hypoglycemia Resolved. Glucose 194 -BMET  Hyperkalemia:  Resolved. K is currently 3.9 - Follow BMET  HFrEF BNP this admission >4500.  TTE in Sept 2017 with LV EF 35-40%. Moderate diffuse hypokinesis with no identifiable regional variations, severe MR and pulmonary HTN. Shehas known right atrial enlargement with severely dilated right atrium.  Echo was ordered during this admission. - Follow TTE results - Increase metoprolol to home dose of 100 mg BID  - Hold lasix for now  - TED hose - Daily weights, I/O's  A-Fib Chronic, taken off warfarin a few years ago due to falls and bleeding risk.  Currently rate is not controlled.    - Increase metoprolol to home dose of 100 mg BID  - Continue ASA 81 daily  - Telemetry  HTN  Hypotensive at 83/45. Patient was given a 500 mL bolus and blood pressure improved to 97/65. - Bolus as needed taking precaution of her heart failure of LVEF 35-40 % - Continue to hold home lisinopril 20 mg daily   Thrombocytopenia Chronic. Platelet count of 85 on admission consistent with previous results. Currently 87 - Trend CBC  Deconditioning - PT eval and treat   FEN Fluid: None Electrolytes: replace as necessary Nutrition: HH diet  DVT [id number]  Code status: DNR  Dispo: Anticipated discharge in approximately 1-2 day(s).    Valinda Party, DO 03/18/2016, 2:28 PM Pager: [phone]

## 2016-03-18 NOTE — Progress Notes (Signed)
Pharmacy consulted to dose Lovenox for VTE prophylaxis. Will start patient on Lovenox 30 mg q 24 hours. Pharmacy sign off.  Thank you for allowing [place] to participate in this patients care. Jens Som, PharmD Pager: [phone]

## 2016-03-18 NOTE — Progress Notes (Addendum)
Internal Medicine Attending:   I saw and examined the patient. I reviewed the resident's note and I agree with the resident's findings and plan as documented in the resident's note. Patient feels well this morning. She does have some productive cough with whitish phlegm. She denies shortness of breath, no chest pain. She is oriented 3. Still noted to have heart rates in the 100s with an irregularly irregular rhythm. She is also noted to have bilateral diffuse wheezing throughout her lung fields. Repeat chest x-ray done today shows a possible left lower lobe infiltrate suspicious for pneumonia. We'll continue with ceftriaxone for possible community-acquired pneumonia. She was noted to have metapneumovirus positive on her respiratory viral panel. This new infiltrate is likely a post viral pneumonia. Continue with DuoNeb nebs every 6 hours for now. Patient is stable for transfer out of stepdown unit.  Patient was noted to have AKI on CKD on admission. Her creatinine is now slowly improving and is at 2.02 today. Her baseline is 1.2-1.3. This is likely prerenal in the setting of poor oral intake. Continue to monitor BMPs for now. Would hold Lasix in the setting of AKI and borderline blood pressures. Increase metoprolol to home dose of 100 mg twice a day to better control heart rate given her A. fib.  Patient is also noted to be thrombocytopenic with a platelet count of 87 in the setting of an acute viral infection. This is likely secondary to her underlying illness and we expect this to continue to improve as she gets better. We'll monitor CBC.

## 2016-03-18 NOTE — Progress Notes (Signed)
Pharmacy Antibiotic Note  [name] is a 82 y.o. female admitted on 03/16/2016 with pneumonia.  Pharmacy has been consulted for ceftriaxone dosing.   Plan: Discontinue Zosyn and vancomycin Start ceftriaxone 1 gram IV q 24 hour Monitor clinical progress, c/s, abx plan No renal adjustment required for this drug Pharmacy sign off   Height: 5\' 5"  (165.1 cm) Weight: 125 lb (56.7 kg) IBW/kg (Calculated) : 57  Temp (24hrs), Avg:97.8 F (36.6 C), Min:97.4 F (36.3 C), Max:98.4 F (36.9 C)   Recent Labs Lab 03/16/16 0146  03/16/16 1503 03/16/16 1808 03/16/16 2114 03/17/16 0000 03/17/16 0239 03/17/16 0556 03/18/16 0537 03/18/16 0732  WBC 5.3  --   --   --   --  6.4  --   --  7.3  --   CREATININE 2.39*  --  2.75*  --   --  2.89*  --   --   --  2.02*  LATICACIDVEN  --   < > 4.4* 3.4* 3.8* 3.3* 3.1* 1.5  --   --   < > = values in this interval not displayed.  Estimated Creatinine Clearance: 17.2 mL/min (by C-G formula based on SCr of 2.02 mg/dL (H)).    No Known Allergies  Antimicrobials this admission: 11/29 Vanc>>12/1 11/29 Zosyn>>12/1 12/1 Rocephin >>   Dose adjustments this admission:  Microbiology results: 11/29 >>MRSA neg 11/29 BC x2>> ngtd 11/29 Nasopharyngeal swab - metapneumovirus   Thank you for allowing [place] to participate in this patients care. Jens Som, PharmD Pager: [phone] 03/18/2016 2:14 PM

## 2016-03-19 LAB — BASIC METABOLIC PANEL
ANION GAP: 6 (ref 5–15)
BUN: 29 mg/dL — ABNORMAL HIGH (ref 6–20)
CALCIUM: 8.7 mg/dL — AB (ref 8.9–10.3)
CO2: 27 mmol/L (ref 22–32)
CREATININE: 1.47 mg/dL — AB (ref 0.44–1.00)
Chloride: 105 mmol/L (ref 101–111)
GFR, EST AFRICAN AMERICAN: 36 mL/min — AB (ref >60)
GFR, EST NON AFRICAN AMERICAN: 31 mL/min — AB (ref >60)
GLUCOSE: 145 mg/dL — AB (ref 65–99)
Potassium: 3.9 mmol/L (ref 3.5–5.1)
Sodium: 138 mmol/L (ref 135–145)

## 2016-03-19 LAB — CBC
HCT: 31.8 % — ABNORMAL LOW (ref 36.0–46.0)
HEMOGLOBIN: 11.1 g/dL — AB (ref 12.0–15.0)
MCH: 31.1 pg (ref 26.0–34.0)
MCHC: 34.9 g/dL (ref 30.0–36.0)
MCV: 89.1 fL (ref 78.0–100.0)
PLATELETS: 80 10*3/uL — AB (ref 150–400)
RBC: 3.57 MIL/uL — ABNORMAL LOW (ref 3.87–5.11)
RDW: 16.7 % — ABNORMAL HIGH (ref 11.5–15.5)
WBC: 6.1 10*3/uL (ref 4.0–10.5)

## 2016-03-19 MED ORDER — METOPROLOL TARTRATE 50 MG PO TABS
50.0000 mg | ORAL_TABLET | Freq: Once | ORAL | Status: AC
  Administered 2016-03-19: 50 mg via ORAL

## 2016-03-19 MED ORDER — DM-GUAIFENESIN ER 30-600 MG PO TB12
1.0000 | ORAL_TABLET | Freq: Two times a day (BID) | ORAL | Status: DC
  Administered 2016-03-19 – 2016-03-21 (×5): 1 via ORAL
  Filled 2016-03-19 (×5): qty 1

## 2016-03-19 NOTE — Progress Notes (Signed)
Subjective: Patient was evaluated this morning. She was laying in bed resting. She states she continues to have a cough with yellow sputum. She reports continued improvement in her breathing. She denies any fever or chills nausea or vomiting.  Objective:  Vital signs in last 24 hours: Vitals:   03/18/16 2026 03/18/16 2157 03/19/16 0132 03/19/16 0600  BP:  (!) 97/41 (!) 117/58 (!) 114/56  Pulse: 94 79 89 82  Resp: 20 18 17 18   Temp:  98.7 F (37.1 C)  98.5 F (36.9 C)  TempSrc:  Oral  Oral  SpO2:  99%  92%  Weight:  123 lb 9.6 oz (56.1 kg)  123 lb 8 oz (56 kg)  Height:       Physical Exam  Constitutional: No distress.  Cardiovascular: Exam reveals no gallop and no friction rub.   No murmur heard. Irregularly irregular  Pulmonary/Chest: Effort normal. No respiratory distress.  Wheezes noted throughout all lung fields  Musculoskeletal: She exhibits no edema.  Skin: Skin is warm and dry.    Assessment/Plan:  Principal Problem:   Viral URI with cough Active Problems:   Atrial fibrillation, controlled (HCC)   Thrombocytopenia (HCC)   CKD (chronic kidney disease) stage 3, GFR 30-59 ml/min   AKI (acute kidney injury) ([place])   Malaise and fatigue  Cough Metapneumovirus positive on RVP.  Patient Continues to have a productive cough and today states the phlegm is yellow in color.  She is satting well on room air. Ceftriaxone was started yesterday for possible CAP. Will add mucinex today. Blood cultures show no growth for 2 days. - Blood cultures NGTD - Day 2 of ceftriaxone for CAP - Duonebs Q6 - Albuterol PRN - Dextromorphan-guaifenesin - Procalcitonin every 48 hours  AKI on CKD3 Cr is improving.  It is currently 1.47 from 2.02 yesterday.  Baseline 1.2-1.3, likely pre-renal in setting of recent poor po intake and hypotension.  CK was elevated when checked yesterday, likely due from hypoperfusion, at 531 and may have contributed to kidney injury as well. - Trend  BMPs  HFrEF BNP this admission >4500.  TTE in Sept 2017 with LV EF 35-40%. During this admission Echo showed minimal improvement of LV EF of 40-45%.  Overall compared to prior study there has not been any significant interval change.  Patient does not appear to be fluid overloaded on exam.   - Continue home dose metoprolol of 100 mg BID  - Hold lasix for now  - TED hose - Daily weights, I/O's  A-Fib Chronic, taken off warfarin a few years ago due to falls and bleeding risk.  Currently she is in A-fib with heart rates in the 70s.    - Continue home dose of 100 mg BID  - Continue ASA 81 daily  - Telemetry  HTN  Patient was hypotensive yesterday but pressures were stable over night and currently stable at 114/56 - Bolus as needed taking precaution of her heart failure of LVEF 35-40 % - Continue to hold home lisinopril 20 mg daily   Thrombocytopenia Chronic. Platelet count of 85 on admission consistent with previous results. Currently 80.  Current infection is likely contributing to her thrombocytopenia as well. - Trend CBC  Deconditioning - PT eval and treat   FEN Fluid: None Electrolytes: replace as necessary Nutrition: HH diet  DVT [id number] per pharm consult  Code status: DNR  Dispo: Anticipated discharge in approximately 1 day(s).   Holly Party, DO 03/19/2016, 7:48 AM Pager: [phone]

## 2016-03-19 NOTE — Progress Notes (Signed)
Notified Internal Medicine Dr. Danford Bad of Bp: 108/49 and heart rate of 64.  Ordered one time dose of 50MG  Metoprolol instead of the scheduled 100MG .  Will continue to monitor and notify as needed

## 2016-03-20 LAB — PROCALCITONIN: PROCALCITONIN: 0.14 ng/mL

## 2016-03-20 MED ORDER — METOPROLOL TARTRATE 50 MG PO TABS
50.0000 mg | ORAL_TABLET | Freq: Once | ORAL | Status: AC
  Administered 2016-03-20: 50 mg via ORAL

## 2016-03-20 MED ORDER — AZITHROMYCIN 250 MG PO TABS: ORAL_TABLET | ORAL | 0 refills | Status: DC

## 2016-03-20 MED ORDER — ALBUTEROL SULFATE HFA 108 (90 BASE) MCG/ACT IN AERS: 2.0000 | INHALATION_SPRAY | Freq: Four times a day (QID) | RESPIRATORY_TRACT | 2 refills | Status: DC | PRN

## 2016-03-20 MED ORDER — CEFPROZIL 500 MG PO TABS: 500.0000 mg | ORAL_TABLET | Freq: Two times a day (BID) | ORAL | 0 refills | Status: DC

## 2016-03-20 MED ORDER — POLYETHYLENE GLYCOL 3350 17 G PO PACK: 17.0000 g | PACK | Freq: Every day | ORAL | 0 refills | Status: AC | PRN

## 2016-03-20 MED ORDER — DM-GUAIFENESIN ER 30-600 MG PO TB12: 1.0000 | ORAL_TABLET | Freq: Two times a day (BID) | ORAL | 0 refills | Status: DC

## 2016-03-20 NOTE — Progress Notes (Signed)
   Subjective: Patient was evaluated this morning. She was resting comfortably in bed and states that her breathing has continued to improve.  She continues to have a productive cough.  She denies fever/chills, chest pain, nausea or vomiting.  Objective:  Vital signs in last 24 hours: Vitals:   03/20/16 0638 03/20/16 0847 03/20/16 0930 03/20/16 0936  BP: 133/60  113/69 131/69  Pulse: 68  65 65  Resp: 18     Temp: 97.5 F (36.4 C)     TempSrc: Oral     SpO2: 100% 94%    Weight: 127 lb 1.6 oz (57.7 kg)     Height:        Physical Exam Constitutional: No distress.  Cardiovascular: Exam reveals no gallop and no friction rub.   No murmur heard. Irregularly irregular  Pulmonary/Chest: Effort normal. No respiratory distress.  Wheezes noted throughout all lung fields  Musculoskeletal: She exhibits no edema.  Skin: Skin is warm and dry.   Assessment/Plan:  Principal Problem:   Viral URI with cough Active Problems:   Atrial fibrillation, controlled (HCC)   Thrombocytopenia (HCC)   CKD (chronic kidney disease) stage 3, GFR 30-59 ml/min   AKI (acute kidney injury) ([place])   Malaise and fatigue  Cough Metapneumovirus positive on RVP. Patient Continues to have a productive cough. She is satting well on room air and is afebrile. She has been treated with ceftriaxone and been given dextromethorphan-guaifenesin. Blood cultures show no growth for 3 days. Procalcitonin is 0.14.  Patient will continue on oral antibiotics on discharge to finish her course for CAP.  Patient has had PT evaluation.  She states she still feels weak and is not at her baseline walking.  She agreed to being discharged to SNF. - Blood cultures NGTD - Day 3 of ceftriaxone for CAP - Duonebs Q6 - Albuterol PRN - Dextromorphan-guaifenesin  AKI on CKD3 Last creatinine 1.47, she has improved daily during admission.  HFrEF Patient does not appear to be fluid overloaded on exam.   - Continue home dose metoprolol of  100 mg BID - Hold lasix for now  - TED hose - Daily weights, I/O's  A-Fib Chronic, taken off warfarin a few years ago due to falls and bleeding risk. Currently she is in A-fib with heart rates in the 60s.  - Continue home dose metoprolol 100 mg BID - Continue ASA 81 daily  - Telemetry  HTN  Patient was hypotensive over night and her metoprolol was cut in half to 50mg .  Current blood pressure is 131/69 and stable. - Bolusas needed taking precaution of her heart failure of LVEF 35-40 % - Continue to hold home lisinopril 20 mg daily   Thrombocytopenia Chronic. Platelet count of 85 on admission consistent with previous results. Current infection is likely contributing to her thrombocytopenia as well.  Deconditioning - PT eval and treat   FEN Fluid: None Electrolytes: replace as necessary Nutrition: HH diet  DVT [id number] per pharm consult  Code status: DNR  Dispo: Anticipated discharge to SNF, awaiting placement.   Valinda Party, DO 03/20/2016, 2:44 PM Pager: [phone]

## 2016-03-21 ENCOUNTER — Encounter: Payer: Self-pay | Admitting: *Deleted

## 2016-03-21 LAB — CULTURE, BLOOD (ROUTINE X 2)
CULTURE: NO GROWTH
Culture: NO GROWTH

## 2016-03-21 NOTE — Consult Note (Signed)
   [facility] CM Inpatient Consult   03/21/2016  [name] 02-Jul-1925 [id number]    Received telephone call from inpatient [facility] for [facility] follow up. [name] was recently active with [facility] and was discharged due to no further needs. However, inpatient RNCM felt HollyDuran could benefit from restarting [place] [facility] program due recommendations for SNF and for multiple co-morbidities. Also advised that [name] will have home health arranged as well as she declines SNF placement. [name] daughter, [name] will be the primary contact person.  Spoke with [name] at bedside who gave Probation officer permission to call her daughter [name] to discuss re-engaging for [place] [facility] services. Telephone call made to Adrian Blackwater at [phone]. [name] states she wants to re-engage with [facility] services. States "we want the same services we had last time with you guys". [name] states she should be the primary contact person at [phone].   [name] lives alone but has supportive family. [name] denies any issues with obtaining medications or with transportation. States patient may have some medication changes upon discharge. Written consent obtained and [facility] packet provided. Made inpatient RNCM aware of above.  Will refer to [facility] for high risk due to [name] living alone and recently active with [facility].   Marthenia Rolling, MSN-Ed, RN,BSN [facility] Liaison [phone]

## 2016-03-21 NOTE — Progress Notes (Signed)
CSW received consult regarding snf placement. Per PT, patient states she can go home with family and friends to supervise.   CSW signing off. Please re-consult if needed.  Percell Locus Akashdeep Chuba LCSWA [phone]

## 2016-03-21 NOTE — Progress Notes (Signed)
Contacted the on call Dr. Hetty Ely regarding the blood pressure of 107/46 and heart rate of 61.  Received an verbal order from Dr. Hetty Ely to give 50 MG of Metoprolol instead of the scheduled 100MG  of Metoprolol.  Pharmacy called to verify order.   Will continue to monitor and notify as needed

## 2016-03-21 NOTE — Discharge Instructions (Signed)
[name],  You have been prescribed 2 antibiotics, please start taking them when you leave the hospital.  Please follow-up with the internal medicine clinic.  An appointment was made for 12/19 at 10:15am

## 2016-03-21 NOTE — Care Management Important Message (Signed)
Important Message  Patient Details  Name: [name] MRN: [id number] Date of Birth: 03-13-1926   Medicare Important Message Given:  Yes    [name] [name] 03/21/2016, 11:59 AM

## 2016-03-21 NOTE — Progress Notes (Signed)
   Subjective: Patient was evaluated this morning on rounds. She states that her breathing continues to improve. She still has a cough although she states she does not have phlegm production.  She denies chest pain or leg swelling.  Objective:  Vital signs in last 24 hours: Vitals:   03/21/16 0639 03/21/16 0704 03/21/16 1204 03/21/16 1400  BP: (!) 109/57   (!) 104/55  Pulse: (!) 56  71 68  Resp: 17   18  Temp: 98.1 F (36.7 C)   98.6 F (37 C)  TempSrc: Oral     SpO2: 97% 99% 100% 99%  Weight:      Height:       Physical Exam  Cardiovascular:  Irregularly irregular  Pulmonary/Chest: Effort normal. No respiratory distress. She has no wheezes.  Bibasilar crackles  Musculoskeletal: She exhibits no edema.  Skin: Skin is warm and dry.    Assessment/Plan:  Principal Problem:   Viral URI with cough Active Problems:   Atrial fibrillation, controlled (HCC)   Thrombocytopenia (HCC)   CKD (chronic kidney disease) stage 3, GFR 30-59 ml/min   AKI (acute kidney injury) ([place])   Malaise and fatigue  Cough Metapneumovirus positive on RVP. Patient continues to have a cough, today without phlegm production. She is satting well on room air and is afebrile. She no longer has wheezing but does have slight bibasilar crackles.  She has been treated with ceftriaxone and been given dextromethorphan-guaifenesin. Blood cultures show no growth to date. Procalcitonin is 0.14. Patient will continue on oral antibiotics on discharge to finish her course for CAP.  Yesterday patient states she wanted SNF placement however today declines. Patient has had PT evaluation with recommendations of home health PT on discharge. - Blood cultures NGTD - Duonebs Q6 - Albuterol PRN - Dextromorphan-guaifenesin  AKI on CKD3 Last creatinine 1.47, she has improved daily during admission.  HFrEF Patient does not appear to be fluid overloaded on exam.  - Continuehome dose metoprolol of 100 mg BID - Hold lasix  for now  - TED hose  A-Fib Chronic, taken off warfarin a few years ago due to falls and bleeding risk. Currently she is in A-fib with heart ratesin the 60s. - Continue home dose metoprolol 100 mg BID - Continue ASA 81 daily   HTN  Current blood pressure is 104/55 and stable. - Bolusas needed taking precaution of her heart failure of LVEF 35-40 % - Continue to hold home lisinopril 20 mg daily   Thrombocytopenia Chronic. Platelet count of 85 on admission consistent with previous results. Current infection is likely contributing to her thrombocytopenia as well.  Deconditioning PT eval and treat -Home health PT on discharge   FEN Fluid: None Electrolytes: replace as necessary Nutrition: HH diet  DVT [id number] per pharm consult  Code status: DNR  Dispo: Anticipated discharge today.   Valinda Party, DO 03/21/2016, 2:32 PM Pager: [phone]

## 2016-03-21 NOTE — Progress Notes (Signed)
Physical Therapy Treatment Patient Details Name: [name] MRN: [id number] DOB: Dec 23, 1925 Today's Date: 03/21/2016    History of Present Illness Pt adm with sepsis of unknown origin. PMH - dementia, afib, chf    PT Comments    The pt is making progress toward her goals, although she fatigues easily.  Pt transitioned out of bed with minimal assistance and ambulated a short distance before needed a rest break.  Pt remains to report she has adequate assistance from her children at home.  Continue to recommend HHPT unless patient does not 24 hour assistance.   Continue with POC.  Follow Up Recommendations  Home health PT;Supervision - Intermittent     Equipment Recommendations  Rolling walker with 5" wheels    Recommendations for Other Services       Precautions / Restrictions Precautions Precautions: Fall Restrictions Weight Bearing Restrictions: No    Mobility  Bed Mobility Overal bed mobility: Needs Assistance Bed Mobility: Supine to Sit     Supine to sit: Min assist;HOB elevated     General bed mobility comments: Min A to scoot to EOB.  Verbal cues for technique and hand placement.  Transfers Overall transfer level: Needs assistance Equipment used: Rolling walker (2 wheeled) Transfers: Sit to/from Stand Sit to Stand: Min assist         General transfer comment: Min A to power up from EOB.  Continual Cues for proper hand placement.  Ambulation/Gait Ambulation/Gait assistance: Min guard;Min assist (With a chair follow.) Ambulation Distance (Feet): 40 Feet Assistive device: Rolling walker (2 wheeled) Gait Pattern/deviations: Step-through pattern;Decreased stride length;Shuffle Gait velocity: decr Gait velocity interpretation: Below normal speed for age/gender General Gait Details: Min A initially to navigate RW around turns but control improved.  Verbal cues needed for hand placement.   Stairs            Wheelchair Mobility    Modified Rankin  (Stroke Patients Only)       Balance     Sitting balance-Leahy Scale: Good       Standing balance-Leahy Scale: Poor                      Cognition Arousal/Alertness: Awake/alert Behavior During Therapy: WFL for tasks assessed/performed                        Exercises      General Comments        Pertinent Vitals/Pain Pain Assessment: No/denies pain    Home Living                      Prior Function            PT Goals (current goals can now be found in the care plan section) Acute Rehab PT Goals Patient Stated Goal: go home PT Goal Formulation: With patient Time For Goal Achievement: 03/25/16 Potential to Achieve Goals: Good Progress towards PT goals: Progressing toward goals    Frequency    Min 3X/week      PT Plan Current plan remains appropriate    Co-evaluation             End of Session Equipment Utilized During Treatment: Gait belt Activity Tolerance: Patient tolerated treatment well Patient left: in chair;with call bell/phone within reach;with chair alarm set     Time: 1126-1200 PT Time Calculation (min) (ACUTE ONLY): 34 min  Charges:  $Gait Training: 8-22 mins $Therapeutic Activity:  8-22 mins                    G Codes:      Bary Castilla Apr 12, 2016, 1:10 PM Rito Ehrlich. [place], [place]

## 2016-03-21 NOTE — Progress Notes (Signed)
Internal Medicine Attending:   I saw and examined the patient. I reviewed the resident's note and I agree with the resident's findings and plan as documented in the resident's note.  Patient denies any new complaints on rounds this morning. States that her breathing continues to improve but still has a mild persistent cough with some mucus production. Patient was found to have a positive metapneumovirus on respiratory viral panel was also found to have an infiltrate on chest x-ray consistent with a pneumonia. We will transition ceftriaxone to oral antibiotics to complete a seven-day course. Patient is to be discharged home today with home PT. She will need close outpatient follow-up with her PCP.

## 2016-03-21 NOTE — Care Management Note (Addendum)
Case Management Note  Patient Details  Name: [name] MRN: [id number] Date of Birth: April 11, 1926  Subjective/Objective:     Admitted with URI,  PMHxof CHF, SSS, pacemaker, chronic a-fib, and CKD stage 3 who presents with multiple complaints and general malaise. States lives alone , however @ discharge states she will have 24/7 supervision from family/friends.  Adrian Blackwater (Daughter) Melanie Crazier (Daughter)    [phone]       Action/Plan: Plan is to d/c today. Pt refusing SNF/ home health services. CM asked pt if its ok to speak with daughter [name] regarding d/c plan. Pt gave CM the ok to speak with daughter. CM spoke with daughter about d/c plan and daughter stated please setup home health services for my mom. CM shared conversation with pt and pt is now in agreement to utilize home health services.  Expected Discharge Date:  03/20/2016            Expected Discharge Plan:  [place],   In-House Referral:     Discharge planning Services  CM Consult  Post Acute Care Choice:    Choice offered to:  Patient/ daughter([name])  DME Arranged:    DME Agency:     HH Arranged:   PT/ Referral made with [facility] @ [phone] [place]  Status of Service:  Completed, signed off  If discussed at [place] of Stay Meetings, dates discussed:    Additional Comments:  [name], RN 03/21/2016, 12:07 PM

## 2016-03-21 NOTE — NC FL2 (Signed)
[place] LEVEL OF CARE SCREENING TOOL     IDENTIFICATION  Patient Name: [name] Birthdate: Dec 19, 1925 Sex: female Admission Date (Current Location): 03/16/2016  [facility] and [place] Number:  Herbalist and Address:  [facility], [place] [address], [place], [place] [address]      Provider Number: [id number]  Attending Physician Name and Address:  Aldine Contes, MD  Relative Name and Phone Number:       Current Level of Care: Hospital Recommended Level of Care: [place] Prior Approval Number:    Date Approved/Denied:   PASRR Number: [id number]  Discharge Plan: SNF    Current Diagnoses: Patient Active Problem List   Diagnosis Date Noted  . Chronic systolic heart failure ([place]) 03/16/2016  . AKI (acute kidney injury) ([place]) 03/16/2016  . Malaise and fatigue 03/16/2016  . Septicemia ([place])   . Hypoglycemia   . Chronic atrial fibrillation ([place])   . Viral URI with cough   . Syncope 12/27/2015  . Hyperglycemia 10/07/2015  . Productive cough 06/26/2015  . Acute on chronic congestive heart failure ([place]) 12/14/2014  . Fecal incontinence due to anorectal disorder 11/06/2014  . Health care maintenance 02/27/2014  . Hypokalemia 01/23/2014  . Malnutrition of moderate degree ([place]) 01/22/2014  . Normocytic anemia 01/21/2014  . CKD (chronic kidney disease) stage 3, GFR 30-59 ml/min 01/21/2014  . Allergic rhinitis 10/16/2013  . Thrombocytopenia ([place]) 09/08/2013  . Unspecified protein-calorie malnutrition 01/23/2013  . Infection of urinary tract 01/22/2013  . Decreased hearing 08/23/2012  . At high risk for falls 08/10/2012  . Age-related cognitive decline 08/10/2012  . GERD (gastroesophageal reflux disease) 06/15/2011  . Pacemaker-[employer] 05/23/2011  . Chronic diastolic heart failure ([place])   . Hypertension   . Osteoporosis   . History of stroke   . Atrial fibrillation, controlled ([place])   .  Incontinence overflow, stress female   . History of sick sinus syndrome   . B12 DEFICIENCY 11/05/2009    Orientation RESPIRATION BLADDER Height & Weight     Self, Time, Situation, Place  Normal Incontinent Weight: 60.8 kg (134 lb 1.6 oz) Height:  5\' 5"  (165.1 cm)  BEHAVIORAL SYMPTOMS/MOOD NEUROLOGICAL BOWEL NUTRITION STATUS      Incontinent Diet (Please see DC Summary)  AMBULATORY STATUS COMMUNICATION OF NEEDS Skin   Limited Assist Verbally Normal                       Personal Care Assistance Level of Assistance  Bathing, Feeding, Dressing Bathing Assistance: Limited assistance Feeding assistance: Independent Dressing Assistance: Limited assistance     Functional Limitations Info  Sight Sight Info: Impaired        SPECIAL CARE FACTORS FREQUENCY  PT (By licensed PT)     PT Frequency: 3x/week              Contractures      Additional Factors Info  Code Status, Allergies Code Status Info: DNR Allergies Info: NKA           Current Medications (03/21/2016):  This is the current hospital active medication list Current Facility-Administered Medications  Medication Dose Route Frequency Provider Last Rate Last Dose  . acetaminophen (TYLENOL) tablet 650 mg  650 mg Oral Q6H PRN Jule Ser, DO       Or  . acetaminophen (TYLENOL) suppository 650 mg  650 mg Rectal Q6H PRN Jule Ser, DO      . albuterol (PROVENTIL) (  2.5 MG/3ML) 0.083% nebulizer solution 2.5 mg  2.5 mg Nebulization Q4H PRN Maryellen Pile, MD      . aspirin EC tablet 81 mg  81 mg Oral Daily Jule Ser, DO   81 mg at 03/20/16 0932  . cefTRIAXone (ROCEPHIN) 1 g in dextrose 5 % 50 mL IVPB  1 g Intravenous Q24H Annia Belt, MD   1 g at 03/20/16 1418  . dextromethorphan-guaiFENesin (MUCINEX DM) 30-600 MG per 12 hr tablet 1 tablet  1 tablet Oral BID Valinda Party, DO   1 tablet at 03/20/16 2142  . diclofenac sodium (VOLTAREN) 1 % transdermal gel 2 g  2 g Topical QID PRN Jule Ser, DO      . enoxaparin (LOVENOX) injection 30 mg  30 mg Subcutaneous Q24H Annia Belt, MD   30 mg at 03/20/16 0935  . feeding supplement (ENSURE ENLIVE) (ENSURE ENLIVE) liquid 237 mL  237 mL Oral TID BM Jule Ser, DO   237 mL at 03/20/16 1852  . ipratropium-albuterol (DUONEB) 0.5-2.5 (3) MG/3ML nebulizer solution 3 mL  3 mL Nebulization TID Annia Belt, MD   3 mL at 03/21/16 0703  . metoprolol (LOPRESSOR) tablet 100 mg  100 mg Oral BID Maryellen Pile, MD   100 mg at 03/20/16 0936  . polyethylene glycol (MIRALAX / GLYCOLAX) packet 17 g  17 g Oral Daily PRN Jule Ser, DO      . sodium chloride flush (NS) 0.9 % injection 3 mL  3 mL Intravenous Q12H Jule Ser, DO   3 mL at 03/20/16 2142     Discharge Medications: Please see discharge summary for a list of discharge medications.  Relevant Imaging Results:  Relevant Lab Results:   Additional Information [place], [place]

## 2016-03-22 DIAGNOSIS — M6281 Muscle weakness (generalized): Secondary | ICD-10-CM | POA: Diagnosis not present

## 2016-03-22 DIAGNOSIS — I5032 Chronic diastolic (congestive) heart failure: Secondary | ICD-10-CM | POA: Diagnosis not present

## 2016-03-23 ENCOUNTER — Telehealth: Payer: Self-pay

## 2016-03-23 ENCOUNTER — Ambulatory Visit: Payer: Commercial Managed Care - HMO | Attending: Audiology | Admitting: Audiology

## 2016-03-23 NOTE — Telephone Encounter (Signed)
Shelby Dubin from [facility] states she is unable to reach pt.

## 2016-03-24 ENCOUNTER — Other Ambulatory Visit: Payer: Self-pay | Admitting: *Deleted

## 2016-03-24 NOTE — Patient Outreach (Signed)
[facility]) Care Management  03/24/2016  [name] [birth year] [id number]   New referral received from hospital liaison as member was recently admitted to hospital for pneumonia.  She was discharged on 12/4.  This member is familiar to this care manager due to previous involvement with [facility].  Daughter, [name], is POA and primary contact.  Call placed to [facility] to restart [facility] services.  She report that the member is "coming along."  She state that she is not 100% back to her baseline, but is getting better since discharge.  She report that the member has been taking her medications as prescribed, including antibiotics and inhaler.  She state she has not restarted her daily weights, but will do so tomorrow.  Daughter report that member has home health ordered but has not started yet.  She state that there was a message left on the voice mail from them, advised to call back as soon as possible in order to start services and decrease risk for readmission.  She verbalizes understanding.    Patient was recently discharged from hospital and all medications have been reviewed.  Daughter confirms that member has follow up with primary MD within the next 2 weeks, denies need for transportation.  She does agree to home visit next week.  She state that she herself will likely be out of town, but the member and/or other family will be available.  She denies any urgent needs for the member.  Will follow up next week.  [name], [place], MSN [place] [phone]

## 2016-03-25 NOTE — Telephone Encounter (Signed)
[name] states she has called all contact #s and they have not been able to start [facility] services due to not being able to see pt

## 2016-03-28 DIAGNOSIS — Z8701 Personal history of pneumonia (recurrent): Secondary | ICD-10-CM | POA: Diagnosis not present

## 2016-03-28 DIAGNOSIS — I4891 Unspecified atrial fibrillation: Secondary | ICD-10-CM | POA: Diagnosis not present

## 2016-03-28 DIAGNOSIS — K5790 Diverticulosis of intestine, part unspecified, without perforation or abscess without bleeding: Secondary | ICD-10-CM | POA: Diagnosis not present

## 2016-03-28 DIAGNOSIS — I5022 Chronic systolic (congestive) heart failure: Secondary | ICD-10-CM | POA: Diagnosis not present

## 2016-03-28 DIAGNOSIS — D696 Thrombocytopenia, unspecified: Secondary | ICD-10-CM | POA: Diagnosis not present

## 2016-03-28 DIAGNOSIS — N183 Chronic kidney disease, stage 3 (moderate): Secondary | ICD-10-CM | POA: Diagnosis not present

## 2016-03-28 DIAGNOSIS — I130 Hypertensive heart and chronic kidney disease with heart failure and stage 1 through stage 4 chronic kidney disease, or unspecified chronic kidney disease: Secondary | ICD-10-CM | POA: Diagnosis not present

## 2016-03-28 DIAGNOSIS — D649 Anemia, unspecified: Secondary | ICD-10-CM | POA: Diagnosis not present

## 2016-03-28 DIAGNOSIS — E1122 Type 2 diabetes mellitus with diabetic chronic kidney disease: Secondary | ICD-10-CM | POA: Diagnosis not present

## 2016-03-29 DIAGNOSIS — I130 Hypertensive heart and chronic kidney disease with heart failure and stage 1 through stage 4 chronic kidney disease, or unspecified chronic kidney disease: Secondary | ICD-10-CM | POA: Diagnosis not present

## 2016-03-29 DIAGNOSIS — E1122 Type 2 diabetes mellitus with diabetic chronic kidney disease: Secondary | ICD-10-CM | POA: Diagnosis not present

## 2016-03-29 DIAGNOSIS — N183 Chronic kidney disease, stage 3 (moderate): Secondary | ICD-10-CM | POA: Diagnosis not present

## 2016-03-29 DIAGNOSIS — K5790 Diverticulosis of intestine, part unspecified, without perforation or abscess without bleeding: Secondary | ICD-10-CM | POA: Diagnosis not present

## 2016-03-29 DIAGNOSIS — D649 Anemia, unspecified: Secondary | ICD-10-CM | POA: Diagnosis not present

## 2016-03-29 DIAGNOSIS — Z8701 Personal history of pneumonia (recurrent): Secondary | ICD-10-CM | POA: Diagnosis not present

## 2016-03-29 DIAGNOSIS — I5022 Chronic systolic (congestive) heart failure: Secondary | ICD-10-CM | POA: Diagnosis not present

## 2016-03-29 DIAGNOSIS — D696 Thrombocytopenia, unspecified: Secondary | ICD-10-CM | POA: Diagnosis not present

## 2016-03-29 DIAGNOSIS — I4891 Unspecified atrial fibrillation: Secondary | ICD-10-CM | POA: Diagnosis not present

## 2016-03-31 ENCOUNTER — Ambulatory Visit: Payer: Self-pay | Admitting: *Deleted

## 2016-03-31 ENCOUNTER — Other Ambulatory Visit: Payer: Self-pay | Admitting: *Deleted

## 2016-03-31 NOTE — Patient Outreach (Signed)
[place] [facility]) Care Management  03/31/2016  [name] 1925/07/11 [id number]   Call placed to member to confirm home visit for this morning.  Member state that she is not feeling well and would like to reschedule visit.  This care manager inquired about member's current health status and the need for evaluation.  She state that she is "fine, I just get like that sometimes.Marland KitchenMarland KitchenDon't want to be bothered."  She report that she is doing better since her discharge but is not up for a home visit today.  She does agree to have conversation over the telephone.    Member report that she continues to have a productive cough, yellow sputum, but denies fever or shortness of breath.  She state that she has had one of her daughters staying with her since discharge.  She report medication compliance with the assistance of her daughter, no questions.  She denies any issues with mobility, stating she has been using her walker for moving around in the home.  She state that she has been seen by home health for nursing and PT.  She is aware of her follow up appointment next week.   She agrees to reschedule home visit within the next 2 weeks.  Will follow up with member/daughter next week after follow up appointment.  [name], [place], MSN [place] [phone]

## 2016-03-31 NOTE — Addendum Note (Signed)
Addended by: Hulan Fray on: 03/31/2016 07:11 PM   Modules accepted: Orders

## 2016-04-04 ENCOUNTER — Telehealth: Payer: Self-pay | Admitting: Internal Medicine

## 2016-04-04 DIAGNOSIS — N183 Chronic kidney disease, stage 3 (moderate): Secondary | ICD-10-CM | POA: Diagnosis not present

## 2016-04-04 DIAGNOSIS — I5022 Chronic systolic (congestive) heart failure: Secondary | ICD-10-CM | POA: Diagnosis not present

## 2016-04-04 DIAGNOSIS — I130 Hypertensive heart and chronic kidney disease with heart failure and stage 1 through stage 4 chronic kidney disease, or unspecified chronic kidney disease: Secondary | ICD-10-CM | POA: Diagnosis not present

## 2016-04-04 DIAGNOSIS — K5790 Diverticulosis of intestine, part unspecified, without perforation or abscess without bleeding: Secondary | ICD-10-CM | POA: Diagnosis not present

## 2016-04-04 DIAGNOSIS — I4891 Unspecified atrial fibrillation: Secondary | ICD-10-CM | POA: Diagnosis not present

## 2016-04-04 DIAGNOSIS — D696 Thrombocytopenia, unspecified: Secondary | ICD-10-CM | POA: Diagnosis not present

## 2016-04-04 DIAGNOSIS — D649 Anemia, unspecified: Secondary | ICD-10-CM | POA: Diagnosis not present

## 2016-04-04 DIAGNOSIS — E1122 Type 2 diabetes mellitus with diabetic chronic kidney disease: Secondary | ICD-10-CM | POA: Diagnosis not present

## 2016-04-04 DIAGNOSIS — Z8701 Personal history of pneumonia (recurrent): Secondary | ICD-10-CM | POA: Diagnosis not present

## 2016-04-04 NOTE — Telephone Encounter (Signed)
APT. REMINDER CALL, LMTCB °

## 2016-04-05 ENCOUNTER — Ambulatory Visit (INDEPENDENT_AMBULATORY_CARE_PROVIDER_SITE_OTHER): Payer: Commercial Managed Care - HMO | Admitting: Internal Medicine

## 2016-04-05 ENCOUNTER — Encounter: Payer: Self-pay | Admitting: Internal Medicine

## 2016-04-05 VITALS — BP 127/76 | HR 76 | Temp 97.4°F | Wt 124.1 lb

## 2016-04-05 DIAGNOSIS — Z8701 Personal history of pneumonia (recurrent): Secondary | ICD-10-CM

## 2016-04-05 DIAGNOSIS — Z09 Encounter for follow-up examination after completed treatment for conditions other than malignant neoplasm: Secondary | ICD-10-CM

## 2016-04-05 DIAGNOSIS — N183 Chronic kidney disease, stage 3 unspecified: Secondary | ICD-10-CM

## 2016-04-05 DIAGNOSIS — J189 Pneumonia, unspecified organism: Secondary | ICD-10-CM | POA: Insufficient documentation

## 2016-04-05 DIAGNOSIS — I5022 Chronic systolic (congestive) heart failure: Secondary | ICD-10-CM | POA: Diagnosis not present

## 2016-04-05 DIAGNOSIS — Z79899 Other long term (current) drug therapy: Secondary | ICD-10-CM

## 2016-04-05 DIAGNOSIS — J181 Lobar pneumonia, unspecified organism: Principal | ICD-10-CM

## 2016-04-05 NOTE — Assessment & Plan Note (Signed)
Recent 5 day hospital hospital stay for respiratory illness, dehydration, and AKI. Initially thought to have simple viral URI (metapneumo + on viral panel) but CXR revealed LLL opacity concerning for pneumonia. She was prescribed a course of Azithromycin and Cefprozil and completed these at home. She reports no recurrence of fevers, productive cough, or dyspnea. She has a mild persistent dry cough which is likely post-infectious/benign. She has been active at home and is eating and drinking well. Her daughters visit often to help her at home and she has [facility] Therapist, sports and PT. She is hemodynamically stable today, no cough during our visit, lungs clear to ausculation with 2+ pitting edema to knees bilaterally, weight stable.   Plan: - Completed antibiotic course with no symptoms of recurrence - Offered flu and pneumonia vaccine but refused by patient

## 2016-04-05 NOTE — Assessment & Plan Note (Signed)
Persistent 2+ pitting edema of lower legs at home and on exam today. Good compliance with Lasix 20mg  daily with good urine output, weight stable and lungs clear to auscultation.  Plan: - Ordered compression stockings - Advised to elevate legs as often as possible each day - Advised to adhere to low-sodium diet

## 2016-04-05 NOTE — Patient Instructions (Addendum)
Please avoid or minimize salty foods such as canned soups and vegetables, deli meats.   Please continue to elevate your legs for a few hours each day.  We have prescribed compression stockings. You may also be able to find some over the counter through [facility] or other pharmacies.  We are checking some blood work today and will call if the results are abnormal.  We are glad that you are healing up well from this recent pneumonia.  Take care and happy holidays!

## 2016-04-05 NOTE — Assessment & Plan Note (Signed)
Recent AKI to Cr 2.9 (from baseline 1.2-3) during recent hospitals stay. Thought to be due to dehydration/poor po intake in setting of respiratory illness. Resolved to Cr 1.47 by day of discharged, improved with IV fluids and good po intake during hospital stay.  Plan: - Check BMP today

## 2016-04-05 NOTE — Progress Notes (Signed)
   CC: Hospital follow up for community acquired pneumonia and acute kidney injury  HPI:  [name] is a 82 y.o. female with PMHx detailed below presenting for follow up of recent hospitalization for viral URI, CAP, and AKI. She was discharged from the hospital over two weeks ago and has been feeling well since with no recurrence of productive cough, dyspnea, or fever. She completed her course of both antibiotics and is eating, drinking well, and ambulatory at home. She has home health RN and PT visiting. She does report a persistent dry cough and pitting edema in her legs that remains a concern.  See problem based assessment and plan below for additional details.  Past Medical History:  Diagnosis Date  . Adenomatous colon polyp    tubular  . Allergic rhinitis   . Anemia    Iron deficiency, does not tolerate po  . Arthritis    "all over my body"  . Atrial fibrillation (HCC)    on coumadin followed by Dr. Elie Confer  . CHF (congestive heart failure) (HCC)    diastolic dysfunction  . Constipation   . Dementia   . Diverticulosis of colon (without mention of hemorrhage)   . DJD (degenerative joint disease)   . GERD (gastroesophageal reflux disease)   . History of sick sinus syndrome s/p pacemaker Dr. Lovena Le 2000  . History of trichomonal urethritis   . History of vaginal bleeding 05/2005   nagative endometrial biopsy  . Hypertension    well controlled on 3 agents  . Incontinence overflow, stress female    on vesicare  . Onychomycosis   . Osteoporosis   . Pacemaker   . Restrictive lung disease 1996   PFT's showed mild disease  . Stroke [facility]) 2002   R MCA, cardioembolic  . Type II diabetes mellitus (HCC)    diet controlled  . Weight loss, unintentional 2010-2011   8/08-8/10 138-146 lbs, between 8/10-10/11 lost 18 lbs., work-up negative   Review of Systems: Review of Systems  Constitutional: Negative for chills, fever, malaise/fatigue and weight loss.  Respiratory:  Positive for cough (dry). Negative for sputum production, shortness of breath and wheezing.   Cardiovascular: Positive for leg swelling. Negative for chest pain, palpitations and orthopnea.  Gastrointestinal: Negative for abdominal pain, nausea and vomiting.  Musculoskeletal: Negative for falls.  All other systems reviewed and are negative.    Physical Exam: Vitals:   04/05/16 1021  BP: 127/76  Pulse: 76  Temp: 97.4 F (36.3 C)  TempSrc: Oral  SpO2: 100%  Weight: 124 lb 1.6 oz (56.3 kg)   Body mass index is 20.65 kg/m. GENERAL- Thin, well-dressed elderly woman sitting comfortably in wheel chair, alert, in no distress HEENT- Atraumatic, PERRL, EOMI, bilat arcus senilis, moist mucous membranes CARDIAC- Regular rate and rhythm, no murmurs, rubs or gallops. RESP- Clear to ascultation bilaterally, no wheezing or crackles, normal work of breathing ABDOMEN- Soft, nontender, nondistended BACK- Normal curvature, no paraspinal tenderness EXTREMITIES- Normal bulk and range of motion, 2+ pitting edema present to knees bilaterally, 1+ peripheral pulses SKIN- Warm, dry, intact, without visible rash PSYCH- Appropriate affect, clear speech, thoughts linear and goal-directed  Assessment & Plan:   See encounters tab for problem based medical decision making.  Patient seen with Dr. Daryll Drown

## 2016-04-06 DIAGNOSIS — I130 Hypertensive heart and chronic kidney disease with heart failure and stage 1 through stage 4 chronic kidney disease, or unspecified chronic kidney disease: Secondary | ICD-10-CM | POA: Diagnosis not present

## 2016-04-06 DIAGNOSIS — K5790 Diverticulosis of intestine, part unspecified, without perforation or abscess without bleeding: Secondary | ICD-10-CM | POA: Diagnosis not present

## 2016-04-06 DIAGNOSIS — I5022 Chronic systolic (congestive) heart failure: Secondary | ICD-10-CM | POA: Diagnosis not present

## 2016-04-06 DIAGNOSIS — N183 Chronic kidney disease, stage 3 (moderate): Secondary | ICD-10-CM | POA: Diagnosis not present

## 2016-04-06 DIAGNOSIS — E1122 Type 2 diabetes mellitus with diabetic chronic kidney disease: Secondary | ICD-10-CM | POA: Diagnosis not present

## 2016-04-06 DIAGNOSIS — I4891 Unspecified atrial fibrillation: Secondary | ICD-10-CM | POA: Diagnosis not present

## 2016-04-06 DIAGNOSIS — D696 Thrombocytopenia, unspecified: Secondary | ICD-10-CM | POA: Diagnosis not present

## 2016-04-06 DIAGNOSIS — D649 Anemia, unspecified: Secondary | ICD-10-CM | POA: Diagnosis not present

## 2016-04-06 DIAGNOSIS — Z8701 Personal history of pneumonia (recurrent): Secondary | ICD-10-CM | POA: Diagnosis not present

## 2016-04-06 LAB — BMP8+ANION GAP
Anion Gap: 15 mmol/L (ref 10.0–18.0)
BUN / CREAT RATIO: 13 (ref 12–28)
BUN: 16 mg/dL (ref 8–27)
CALCIUM: 9.2 mg/dL (ref 8.7–10.3)
CO2: 28 mmol/L (ref 18–29)
CREATININE: 1.23 mg/dL — AB (ref 0.57–1.00)
Chloride: 97 mmol/L (ref 96–106)
GFR calc non Af Amer: 39 mL/min/{1.73_m2} — ABNORMAL LOW (ref >59)
GFR, EST AFRICAN AMERICAN: 45 mL/min/{1.73_m2} — AB (ref >59)
Glucose: 105 mg/dL — ABNORMAL HIGH (ref 65–99)
Potassium: 3.7 mmol/L (ref 3.5–5.2)
Sodium: 140 mmol/L (ref 134–144)

## 2016-04-07 ENCOUNTER — Other Ambulatory Visit: Payer: Self-pay | Admitting: *Deleted

## 2016-04-07 NOTE — Patient Outreach (Signed)
[facility]) Care Management  04/07/2016  [name] Apr 04, 1926 [id number]   Weekly transition of care call placed to member, no answer.  HIPAA compliant voice message left.  Will await call back, if no call back, will follow up next prior to scheduled home visit.   Call then placed to member's daughter, listed POA, [name].  She does not know what the member's weight is but will review today when she go to check on her (she has been out of town).  She report that the member is getting better, however during her follow up appointment it was recommended to increase Lasix.  She state that she is struggling to have member be compliant with this due to the frequent urination.  She report that member refuses at times.  She is made aware that member declined home visit for last week, but new home visit scheduled or next week.  Daughter denies concerns at this time.  Will follow up next week for home visit.  [name], [place], MSN [place] [phone]

## 2016-04-08 DIAGNOSIS — Z8701 Personal history of pneumonia (recurrent): Secondary | ICD-10-CM | POA: Diagnosis not present

## 2016-04-08 DIAGNOSIS — K5790 Diverticulosis of intestine, part unspecified, without perforation or abscess without bleeding: Secondary | ICD-10-CM | POA: Diagnosis not present

## 2016-04-08 DIAGNOSIS — I5022 Chronic systolic (congestive) heart failure: Secondary | ICD-10-CM | POA: Diagnosis not present

## 2016-04-08 DIAGNOSIS — N183 Chronic kidney disease, stage 3 (moderate): Secondary | ICD-10-CM | POA: Diagnosis not present

## 2016-04-08 DIAGNOSIS — I130 Hypertensive heart and chronic kidney disease with heart failure and stage 1 through stage 4 chronic kidney disease, or unspecified chronic kidney disease: Secondary | ICD-10-CM | POA: Diagnosis not present

## 2016-04-08 DIAGNOSIS — D649 Anemia, unspecified: Secondary | ICD-10-CM | POA: Diagnosis not present

## 2016-04-08 DIAGNOSIS — D696 Thrombocytopenia, unspecified: Secondary | ICD-10-CM | POA: Diagnosis not present

## 2016-04-08 DIAGNOSIS — I4891 Unspecified atrial fibrillation: Secondary | ICD-10-CM | POA: Diagnosis not present

## 2016-04-08 DIAGNOSIS — E1122 Type 2 diabetes mellitus with diabetic chronic kidney disease: Secondary | ICD-10-CM | POA: Diagnosis not present

## 2016-04-12 ENCOUNTER — Other Ambulatory Visit: Payer: Self-pay | Admitting: *Deleted

## 2016-04-12 NOTE — Patient Outreach (Signed)
[place] [facility]) Care Management  04/12/2016  [name] 20-Aug-1925 [id number]   Arrived at [facility] home as discussed with daughter last week (Thursday) for home visit.  No answer after multiple knocks.  Call then placed to member, no answer, HIPAA compliant voice message left.  Will await call back.  If no call back, will follow up next week.    [name], [place], MSN [place] [phone]

## 2016-04-13 DIAGNOSIS — I4891 Unspecified atrial fibrillation: Secondary | ICD-10-CM | POA: Diagnosis not present

## 2016-04-13 DIAGNOSIS — I5022 Chronic systolic (congestive) heart failure: Secondary | ICD-10-CM | POA: Diagnosis not present

## 2016-04-13 DIAGNOSIS — I130 Hypertensive heart and chronic kidney disease with heart failure and stage 1 through stage 4 chronic kidney disease, or unspecified chronic kidney disease: Secondary | ICD-10-CM | POA: Diagnosis not present

## 2016-04-13 DIAGNOSIS — D696 Thrombocytopenia, unspecified: Secondary | ICD-10-CM | POA: Diagnosis not present

## 2016-04-13 DIAGNOSIS — Z8701 Personal history of pneumonia (recurrent): Secondary | ICD-10-CM | POA: Diagnosis not present

## 2016-04-13 DIAGNOSIS — D649 Anemia, unspecified: Secondary | ICD-10-CM | POA: Diagnosis not present

## 2016-04-13 DIAGNOSIS — N183 Chronic kidney disease, stage 3 (moderate): Secondary | ICD-10-CM | POA: Diagnosis not present

## 2016-04-13 DIAGNOSIS — E1122 Type 2 diabetes mellitus with diabetic chronic kidney disease: Secondary | ICD-10-CM | POA: Diagnosis not present

## 2016-04-13 DIAGNOSIS — K5790 Diverticulosis of intestine, part unspecified, without perforation or abscess without bleeding: Secondary | ICD-10-CM | POA: Diagnosis not present

## 2016-04-13 NOTE — Progress Notes (Signed)
Internal Medicine Clinic Attending  I saw and evaluated the patient.  I personally confirmed the key portions of the history and exam documented by Dr. Johnson and I reviewed pertinent patient test results.  The assessment, diagnosis, and plan were formulated together and I agree with the documentation in the resident's note.  

## 2016-04-14 ENCOUNTER — Encounter: Payer: Self-pay | Admitting: Internal Medicine

## 2016-04-14 ENCOUNTER — Ambulatory Visit (INDEPENDENT_AMBULATORY_CARE_PROVIDER_SITE_OTHER): Payer: Commercial Managed Care - HMO | Admitting: Internal Medicine

## 2016-04-14 VITALS — BP 122/89 | HR 98 | Temp 98.4°F | Ht 60.0 in | Wt 126.6 lb

## 2016-04-14 DIAGNOSIS — R159 Full incontinence of feces: Secondary | ICD-10-CM

## 2016-04-14 DIAGNOSIS — I5042 Chronic combined systolic (congestive) and diastolic (congestive) heart failure: Secondary | ICD-10-CM

## 2016-04-14 DIAGNOSIS — I5022 Chronic systolic (congestive) heart failure: Secondary | ICD-10-CM

## 2016-04-14 DIAGNOSIS — N183 Chronic kidney disease, stage 3 (moderate): Secondary | ICD-10-CM

## 2016-04-14 DIAGNOSIS — K6289 Other specified diseases of anus and rectum: Secondary | ICD-10-CM

## 2016-04-14 DIAGNOSIS — R5383 Other fatigue: Secondary | ICD-10-CM

## 2016-04-14 DIAGNOSIS — R5381 Other malaise: Secondary | ICD-10-CM

## 2016-04-14 DIAGNOSIS — R5382 Chronic fatigue, unspecified: Secondary | ICD-10-CM

## 2016-04-14 MED ORDER — LOPERAMIDE HCL 2 MG PO CAPS: 2.0000 mg | ORAL_CAPSULE | ORAL | 0 refills | Status: DC | PRN

## 2016-04-14 NOTE — Assessment & Plan Note (Signed)
She has chronic leg swelling related to her CHF. She does not appear to be volume overloaded as lungs clear and weight stable. Recommended to continue Lasix 60 mg in AM and 40 mg in PM, use compression stockings, raise legs whenever possible, and avoid excess salt in the diet.

## 2016-04-14 NOTE — Assessment & Plan Note (Signed)
Refilled Imodium and advised to take as needed for loose stools. Recommended to stop Imodium if she develops constipation. DME order placed for adult diapers.

## 2016-04-14 NOTE — Patient Instructions (Signed)
General Instructions: - Please use compression stockings to help your leg swelling - Also keep your legs propped up as much as possible and avoid extra salt in your diet - Try Imodium as needed for loose stools. If you start to get constipated then stop Imodium - Order placed for adult diapers. Our office will let you know when this is ready.  Please bring your medicines with you each time you come to clinic.  Medicines may include prescription medications, over-the-counter medications, herbal remedies, eye drops, vitamins, or other pills.   Progress Toward Treatment Goals:  Treatment Goal 03/04/2015  Hemoglobin A1C -  Blood pressure at goal  Prevent falls -    Self Care Goals & Plans:  Self Care Goal 10/07/2015  Manage my medications take my medicines as prescribed; bring my medications to every visit; refill my medications on time  Monitor my health -  Eat healthy foods drink diet soda or water instead of juice or soda; eat more vegetables; eat foods that are low in salt; eat baked foods instead of fried foods; eat fruit for snacks and desserts  Be physically active find an activity I enjoy  Prevent falls -  Meeting treatment goals maintain the current self-care plan    Home Blood Glucose Monitoring 10/29/2012  Check my blood sugar no home glucose monitoring  When to check my blood sugar -     Care Management & Community Referrals:  Referral 06/11/2014  Referrals made for care management support none needed  Referrals made to community resources -

## 2016-04-14 NOTE — Assessment & Plan Note (Signed)
This is a chronic problem for her likely related to her multiple comorbidities. She did not seem interested in working with PT again. She is eating well. Recommended to continue being active as much as possible.

## 2016-04-14 NOTE — Progress Notes (Signed)
   CC: Leg swelling, fecal incontinence  HPI:  [name] is a 82 y.o. woman with PMHx as noted below who presents today for follow up of her leg swelling and fecal incontinence.   Combined CHF: This is a chronic issue for her. She has a hx of combined CHF and CKD stage 3 that contribute to her leg swelling. She is frustrated that her legs continue to swell despite elevating her legs when sitting down and taking her Lasix. She has not been using compression stockings. She denies any pain in her calves. Swelling is no worse than normal. She reports some SOB when walking around.   Fatigue: Reports feeling very fatigued. This is a chronic issue for her. She was recently hospitalized for CAP and had physical therapy working with her at home. She reports she did well with PT and is able to get up and walk around with her cane.   Fecal Incontinence: This is a chronic issue for her. She reports not knowing when she will have a bowel movement and will soil her underwear. She states this happens a few times a week. She had been taking Imodium in the past per her daughter who was present at the visit. She was also evaluated by GI but was told she could not undergo colonoscopy due to risks of anesthesia in combination with her comorbidities. Patient is not using adult diapers.  Past Medical History:  Diagnosis Date  . Adenomatous colon polyp    tubular  . Allergic rhinitis   . Anemia    Iron deficiency, does not tolerate po  . Arthritis    "all over my body"  . Atrial fibrillation (HCC)    on coumadin followed by Dr. Elie Confer  . CHF (congestive heart failure) (HCC)    diastolic dysfunction  . Constipation   . Dementia   . Diverticulosis of colon (without mention of hemorrhage)   . DJD (degenerative joint disease)   . GERD (gastroesophageal reflux disease)   . History of sick sinus syndrome s/p pacemaker Dr. Lovena Le 2000  . History of trichomonal urethritis   . History of vaginal bleeding  05/2005   nagative endometrial biopsy  . Hypertension    well controlled on 3 agents  . Incontinence overflow, stress female    on vesicare  . Onychomycosis   . Osteoporosis   . Pacemaker   . Restrictive lung disease 1996   PFT's showed mild disease  . Stroke [facility]) 2002   R MCA, cardioembolic  . Type II diabetes mellitus (HCC)    diet controlled  . Weight loss, unintentional 2010-2011   8/08-8/10 138-146 lbs, between 8/10-10/11 lost 18 lbs., work-up negative    Review of Systems:  All negative except per HPI  Physical Exam:  Vitals:   04/14/16 1052  BP: 122/89  Pulse: 98  Temp: 98.4 F (36.9 C)  TempSrc: Oral  SpO2: 100%  Weight: 126 lb 9.6 oz (57.4 kg)  Height: 5' (1.524 m)   General: elderly woman sitting up, NAD HEENT: [place]/AT, EOMI, sclera anicteric, mucus membranes moist CV: irregularly irregular rhythm, no m/g/r Pulm: CTA bilaterally, breaths non-labored Abd: BS+, soft, non-tender Ext: warm, 2+ pitting edema in lower extremities bilaterally Neuro: alert and oriented x 3. Strength 5/5 in lower extremities.   Assessment & Plan:   See Encounters Tab for problem based charting.  Patient discussed with Dr. Lynnae January

## 2016-04-15 DIAGNOSIS — D696 Thrombocytopenia, unspecified: Secondary | ICD-10-CM | POA: Diagnosis not present

## 2016-04-15 DIAGNOSIS — D649 Anemia, unspecified: Secondary | ICD-10-CM | POA: Diagnosis not present

## 2016-04-15 DIAGNOSIS — N183 Chronic kidney disease, stage 3 (moderate): Secondary | ICD-10-CM | POA: Diagnosis not present

## 2016-04-15 DIAGNOSIS — E1122 Type 2 diabetes mellitus with diabetic chronic kidney disease: Secondary | ICD-10-CM | POA: Diagnosis not present

## 2016-04-15 DIAGNOSIS — K5790 Diverticulosis of intestine, part unspecified, without perforation or abscess without bleeding: Secondary | ICD-10-CM | POA: Diagnosis not present

## 2016-04-15 DIAGNOSIS — I130 Hypertensive heart and chronic kidney disease with heart failure and stage 1 through stage 4 chronic kidney disease, or unspecified chronic kidney disease: Secondary | ICD-10-CM | POA: Diagnosis not present

## 2016-04-15 DIAGNOSIS — Z8701 Personal history of pneumonia (recurrent): Secondary | ICD-10-CM | POA: Diagnosis not present

## 2016-04-15 DIAGNOSIS — I4891 Unspecified atrial fibrillation: Secondary | ICD-10-CM | POA: Diagnosis not present

## 2016-04-15 DIAGNOSIS — I5022 Chronic systolic (congestive) heart failure: Secondary | ICD-10-CM | POA: Diagnosis not present

## 2016-04-19 DIAGNOSIS — Z8701 Personal history of pneumonia (recurrent): Secondary | ICD-10-CM | POA: Diagnosis not present

## 2016-04-19 DIAGNOSIS — K5790 Diverticulosis of intestine, part unspecified, without perforation or abscess without bleeding: Secondary | ICD-10-CM | POA: Diagnosis not present

## 2016-04-19 DIAGNOSIS — E1122 Type 2 diabetes mellitus with diabetic chronic kidney disease: Secondary | ICD-10-CM | POA: Diagnosis not present

## 2016-04-19 DIAGNOSIS — I5022 Chronic systolic (congestive) heart failure: Secondary | ICD-10-CM | POA: Diagnosis not present

## 2016-04-19 DIAGNOSIS — D696 Thrombocytopenia, unspecified: Secondary | ICD-10-CM | POA: Diagnosis not present

## 2016-04-19 DIAGNOSIS — I4891 Unspecified atrial fibrillation: Secondary | ICD-10-CM | POA: Diagnosis not present

## 2016-04-19 DIAGNOSIS — D649 Anemia, unspecified: Secondary | ICD-10-CM | POA: Diagnosis not present

## 2016-04-19 DIAGNOSIS — N183 Chronic kidney disease, stage 3 (moderate): Secondary | ICD-10-CM | POA: Diagnosis not present

## 2016-04-19 DIAGNOSIS — I130 Hypertensive heart and chronic kidney disease with heart failure and stage 1 through stage 4 chronic kidney disease, or unspecified chronic kidney disease: Secondary | ICD-10-CM | POA: Diagnosis not present

## 2016-04-20 ENCOUNTER — Other Ambulatory Visit: Payer: Self-pay | Admitting: *Deleted

## 2016-04-20 NOTE — Patient Outreach (Signed)
[facility]) Care Management  04/20/2016  FANTAZIA CLURE April 18, 1926 [id number]   Call placed to member to follow up on current health status and missed home visit last week, no answer.  HIPAA compliant voice message left.  Call then placed to member's daughter, [name], no answer.  HIPAA compliant voice message left.  Will await call back.  If no call back, will follow up next week.  [name], [place], MSN [place] [phone]

## 2016-04-22 NOTE — Progress Notes (Signed)
Internal Medicine Clinic Attending  Case discussed with Dr. Rivet soon after the resident saw the patient.  We reviewed the resident's history and exam and pertinent patient test results.  I agree with the assessment, diagnosis, and plan of care documented in the resident's note.  

## 2016-04-25 ENCOUNTER — Other Ambulatory Visit: Payer: Self-pay | Admitting: *Deleted

## 2016-04-25 NOTE — Patient Outreach (Signed)
[facility]) Care Management  04/25/2016  [name] 10-May-1925 [id number]   Call placed to member to follow up on current health status reschedule home visit as this care manager has been unable to contact member for the past 2 weeks.  Member answers phone, daughter [name] with the member.  Discussed missed appointment on 12/26, member apologizes and state that she was home but maybe asleep.  She state that she is doing well, denies urgent concerns.  Agrees to home visit this week.  [name], [place], MSN [place] [phone]

## 2016-04-26 ENCOUNTER — Encounter: Payer: Medicare HMO | Admitting: Internal Medicine

## 2016-04-27 ENCOUNTER — Encounter: Payer: Self-pay | Admitting: Internal Medicine

## 2016-04-28 ENCOUNTER — Other Ambulatory Visit: Payer: Self-pay | Admitting: *Deleted

## 2016-04-28 NOTE — Patient Outreach (Signed)
[facility]) Care Management   04/28/2016  [name] 06-Jul-1925 [id number]  [name] is an 83 y.o. female  Subjective:   Member reports that she is "feeling pretty good."  She denies pain, discomfort, or shortness of breath at this time.  She state that she has been compliant with all medications.    Objective:   Review of Systems  Constitutional: Negative.   HENT: Negative.   Eyes: Negative.   Respiratory: Negative.   Cardiovascular: Positive for leg swelling.  Gastrointestinal: Negative.   Genitourinary: Negative.   Musculoskeletal: Negative.   Skin: Negative.   Neurological: Negative.   Endo/Heme/Allergies: Negative.   Psychiatric/Behavioral: Negative.     Physical Exam  Constitutional: She is oriented to person, place, and time. She appears well-developed and well-nourished.  Neck: Normal range of motion.  Cardiovascular: Normal rate, regular rhythm and normal heart sounds.   Respiratory: Effort normal and breath sounds normal.  GI: Soft. Bowel sounds are normal.  Musculoskeletal: Normal range of motion.  Neurological: She is alert and oriented to person, place, and time.  Skin: Skin is warm and dry.   BP 102/64 (BP Location: Right Arm, Patient Position: Sitting, Cuff Size: Normal)   Pulse 68   Resp 18   Ht 1.524 m (5')   Wt 124 lb (56.2 kg)   SpO2 98%   BMI 24.22 kg/m   Encounter Medications:   Outpatient Encounter Prescriptions as of 04/28/2016  Medication Sig  . albuterol (PROVENTIL HFA;VENTOLIN HFA) 108 (90 Base) MCG/ACT inhaler Inhale 2 puffs into the lungs every 6 (six) hours as needed for wheezing or shortness of breath.  Marland Kitchen aspirin EC 81 MG tablet Take 81 mg by mouth daily.  . diclofenac sodium (VOLTAREN) 1 % GEL Apply 2 g topically 4 (four) times daily. To left shoulder. (Patient taking differently: Apply 2 g topically 4 (four) times daily as needed (pain). To left shoulder.)  . furosemide (LASIX) 20 MG tablet Take 3 tabs  (36m) every morning and 2 tabs (458m every afternoon.  . Marland Kitchenisinopril (PRINIVIL,ZESTRIL) 20 MG tablet TAKE 1 TABLET BY MOUTH DAILY  . loperamide (IMODIUM) 2 MG capsule Take 1 capsule (2 mg total) by mouth as needed for diarrhea or loose stools.  . metoprolol (LOPRESSOR) 100 MG tablet TAKE 1 TABLET(100 MG) BY MOUTH TWICE DAILY  . Nutritional Supplements (ENStantonLIQD Take 237 mLs by mouth 3 (three) times daily.  . polyethylene glycol (MIRALAX / GLYCOLAX) packet Take 17 g by mouth daily as needed for mild constipation.  . potassium chloride 20 MEQ/15ML (10%) SOLN TAKE 15 MLS BY MOUTH DAILY  . dextromethorphan-guaiFENesin (MUCINEX DM) 30-600 MG 12hr tablet Take 1 tablet by mouth 2 (two) times daily. (Patient not taking: Reported on 04/28/2016)   No facility-administered encounter medications on file as of 04/28/2016.     Functional Status:   In your present state of health, do you have any difficulty performing the following activities: 04/28/2016 04/14/2016  Hearing? Y Tempie DonningVision? Y Y  Difficulty concentrating or making decisions? Y Tempie DonningWalking or climbing stairs? Y Y  Dressing or bathing? N N  Doing errands, shopping? Y N  Preparing Food and eating ? N -  Using the Toilet? Y -  In the past six months, have you accidently leaked urine? Y -  Do you have problems with loss of bowel control? Y -  Managing your Medications? Y -  Managing your Finances? N -  Housekeeping or managing your Housekeeping? N -  Some recent data might be hidden    Fall/Depression Screening:    PHQ 2/9 Scores 04/28/2016 04/14/2016 02/17/2016 01/28/2016 12/17/2015 10/07/2015 06/26/2015  PHQ - 2 Score 0 0 0 0 0 0 0    Assessment:    Met with member at scheduled time, daughter [name] present.  Daughter confirms that member has been improving.  She is finished with all antibiotics, denies member showing any signs of pneumonia.  He concern now is the member's heart failure management.  She state that the  member has done well with her diet and medication compliance, but has not been consistent with elevating her legs or wearing her compression stockings.  Daughter state that the member was wearing them but asked her to take them off.  She state that she did see a difference in the member's legs when she had them on.  She report that she is in the process of purchasing new ones that will be easier for the member to put on/off independently.  Member also encouraged to elevate feet while sitting.  Both daughter and member report that she does follow low sodium diet.  Member report that she does weigh herself daily, unable to find weight log (blood pressure log found).  Daughter report that weight has ranged between 120-124 pounds, today 122 pounds.  Verified that member does have [facility] calendar book with logs.  Advised to document weights as well as blood pressure daily.  They both verbalize understanding.  Member has follow up appointments with primary MD and cardiology early next month.  Daughter denies question, member denies concern.  Advised to contact with concerns.  Plan:   Will follow up next month with routine home visit.  THN CM Care Plan Problem One   Flowsheet Row Most Recent Value  Care Plan Problem One  Risk for readmission related to pneumonia as evidenced by recent hospitalization  Role Documenting the Problem One  Care Management Coordinator  Care Plan for Problem One  Not Active  THN Long Term Goal (31-90 days)  Member will not be readmitted to hospital within 31 days of discharge  [facility] Long Term Goal Start Date  03/24/16  [facility] Long Term Goal Met Date  04/28/16  Interventions for Problem One Long Term Goal  Discussed with daughter/caregiver importance of following discharge instructions (including involvement with home health and diet) as it relates directly to risk for readmission.  THN CM Short Term Goal #1 (0-30 days)  Member will keep and attend follow up appointment with PCP within  the next 2 weeks  THN CM Short Term Goal #1 Start Date  03/24/16  [facility] CM Short Term Goal #1 Met Date  04/07/16  Interventions for Short Term Goal #1  Discussed with daughter that follow up appointment has been made, confirmed transportation.  Discussed the importance of keeping/attending appointment as it decreases risk of readmission.  Member reminded of appointment scheduled for next week.  THN CM Short Term Goal #2 (0-30 days)  Member will report a decrease in signs/smptoms of pneumonia within the next 4 weeks  THN CM Short Term Goal #2 Start Date  03/24/16  [facility] CM Short Term Goal #2 Met Date  04/28/16  Interventions for Short Term Goal #2  Caregiver/daughter educated on signs/symptoms of pneumonia,, and importance of taking medications as using inhaler as instructed.  Educated using teachback method on when to notify MD.    [facility] CM Care Plan Problem Two  Flowsheet Row Most Recent Value  Care Plan Problem Two  Risk for hospitalization related to heart failure management as evidenced by leg edema  Role Documenting the Problem Two  Care Management Coordinator  Care Plan for Problem Two  Active  Interventions for Problem Two Long Term Goal   Re-educated member and daughter on interventions to decrease risk of admission (daily weights, diet, exercise, elevating legs, medicatioin compliance, and MD follow up)  THN Long Term Goal (31-90) days  Member will not be admitted to hospital for heart failure related complications within the next 31 days  THN Long Term Goal Start Date  04/28/16  THN CM Short Term Goal #1 (0-30 days)  Member will report wearing compression stockings daily over the next 4 weeks  THN CM Short Term Goal #1 Start Date  04/28/16  Interventions for Short Term Goal #2   Discussed with daughter the importance of member wearing compression stockings to relieve lower extremity swelling.  Also advised of the importance of having stockings of appropriate size that member will be able to  apply independently.  THN CM Short Term Goal #2 (0-30 days)  Member will weigh self daily and record readings over the next 4 weeks  THN CM Short Term Goal #2 Start Date  04/28/16  Interventions for Short Term Goal #2  Re-educated member and daughter on the importance of daily weights and its relation to monitoring fluid status.       [name], [place], MSN [place] [phone]

## 2016-05-02 ENCOUNTER — Encounter: Payer: Self-pay | Admitting: *Deleted

## 2016-05-04 ENCOUNTER — Other Ambulatory Visit: Payer: Self-pay | Admitting: Internal Medicine

## 2016-05-24 ENCOUNTER — Telehealth: Payer: Self-pay | Admitting: Internal Medicine

## 2016-05-24 NOTE — Telephone Encounter (Signed)
APT. REMINDER CALL, NO ANSWER, NO VOICEMAIL °

## 2016-05-25 ENCOUNTER — Encounter (INDEPENDENT_AMBULATORY_CARE_PROVIDER_SITE_OTHER): Payer: Self-pay

## 2016-05-25 ENCOUNTER — Encounter: Payer: Self-pay | Admitting: Internal Medicine

## 2016-05-25 ENCOUNTER — Ambulatory Visit (INDEPENDENT_AMBULATORY_CARE_PROVIDER_SITE_OTHER): Payer: Medicare HMO | Admitting: Internal Medicine

## 2016-05-25 VITALS — BP 159/82 | HR 84 | Temp 97.8°F | Ht 60.0 in | Wt 135.9 lb

## 2016-05-25 DIAGNOSIS — I110 Hypertensive heart disease with heart failure: Secondary | ICD-10-CM

## 2016-05-25 DIAGNOSIS — I5032 Chronic diastolic (congestive) heart failure: Secondary | ICD-10-CM

## 2016-05-25 DIAGNOSIS — J181 Lobar pneumonia, unspecified organism: Secondary | ICD-10-CM

## 2016-05-25 DIAGNOSIS — J189 Pneumonia, unspecified organism: Secondary | ICD-10-CM

## 2016-05-25 DIAGNOSIS — Z5189 Encounter for other specified aftercare: Secondary | ICD-10-CM

## 2016-05-25 DIAGNOSIS — I10 Essential (primary) hypertension: Secondary | ICD-10-CM

## 2016-05-25 DIAGNOSIS — I5023 Acute on chronic systolic (congestive) heart failure: Secondary | ICD-10-CM

## 2016-05-25 DIAGNOSIS — I5042 Chronic combined systolic (congestive) and diastolic (congestive) heart failure: Secondary | ICD-10-CM

## 2016-05-25 DIAGNOSIS — D696 Thrombocytopenia, unspecified: Secondary | ICD-10-CM | POA: Diagnosis not present

## 2016-05-25 DIAGNOSIS — Z79899 Other long term (current) drug therapy: Secondary | ICD-10-CM

## 2016-05-25 LAB — BASIC METABOLIC PANEL
Anion gap: 8 (ref 5–15)
BUN: 10 mg/dL (ref 6–20)
CALCIUM: 9.7 mg/dL (ref 8.9–10.3)
CHLORIDE: 102 mmol/L (ref 101–111)
CO2: 28 mmol/L (ref 22–32)
CREATININE: 1.11 mg/dL — AB (ref 0.44–1.00)
GFR calc Af Amer: 50 mL/min — ABNORMAL LOW (ref >60)
GFR, EST NON AFRICAN AMERICAN: 43 mL/min — AB (ref >60)
Glucose, Bld: 191 mg/dL — ABNORMAL HIGH (ref 65–99)
Potassium: 4.3 mmol/L (ref 3.5–5.1)
SODIUM: 138 mmol/L (ref 135–145)

## 2016-05-25 LAB — CBC
HCT: 31.7 % — ABNORMAL LOW (ref 36.0–46.0)
Hemoglobin: 10.4 g/dL — ABNORMAL LOW (ref 12.0–15.0)
MCH: 30.7 pg (ref 26.0–34.0)
MCHC: 32.8 g/dL (ref 30.0–36.0)
MCV: 93.5 fL (ref 78.0–100.0)
PLATELETS: 125 10*3/uL — AB (ref 150–400)
RBC: 3.39 MIL/uL — ABNORMAL LOW (ref 3.87–5.11)
RDW: 16.1 % — AB (ref 11.5–15.5)
WBC: 3.6 10*3/uL — ABNORMAL LOW (ref 4.0–10.5)

## 2016-05-25 LAB — BRAIN NATRIURETIC PEPTIDE: B Natriuretic Peptide: 1321.2 pg/mL — ABNORMAL HIGH (ref 0.0–100.0)

## 2016-05-25 NOTE — Patient Instructions (Addendum)
Ms. Craycraft - -   I am concerned that your heart failure is a little worse.  You have gained about 10 pounds and appear to be retaining fluid.    You are supposed to be taking lasxi 60mg  (3 tabs) in the morning and 40mg  (2 tabs) in the evening.  If this is the dose you have been taking, please INCREASE to 80mg  twice a day for 1 week (this would be 4 tablets twice a day).  Please come back to the clinic in 1 week to see how you are doing.    If you have not been taking that dose of medication, please go to the prescribed dose of 60mg  in the morning and 40mg  in the evening.    If the weakness in your left leg should get worse, please come back to the clinic or hospital immediately.    Briefly  INCREASE your lasix to 80mg  twice a day (4 tablets twice a day)  Return to the clinic in 1 week  Please have your daughter call me with any questions.

## 2016-05-25 NOTE — Progress Notes (Signed)
   Subjective:    Patient ID: [name], female    DOB: Dec 16, 1925, 83 y.o.   MRN: [id number]  6 month follow up for HTN  HPI  [name] is an 83yo woman with PMH of HTN, CKD, chronic Afib, chronic combined CHF, GERD, malnutrition who presents for follow up.   Since I last saw her 1 year ago, she has been seen in the resident clinic on multiple occasions and she was admitted to the hospital twice.  She has some cognitive decline and is unaccompanied today.    [name] reports that she has been having increased swelling for 1 week.  Her legs are tight and bigger and this is causing her pain.  She reports no increased SOB, orthopnea, PND or chest pain.  She does not know if they are better in the morning.  Unfortunately, her daughter who is normally with her, had to go to the ED for not feeling well, so [name] is not clear on her answers.  She reports at first taking only 1 lasix tablet twice per day, but then on pressing reports that her daughter puts her pill box together and puts 3 pills of lasix in the morning and 2 in the evening (her correct dose).  She does report that she went to [place] on the 3rd of February, which is different for her compared to her usual diet.  Further symptoms include easily fatigued, pain in both legs.  No fever, chills, nausea, vomiting.  She does note easy bruising.    Her BP today is elevated and her weight is about 11 pounds up.    She was last admitted in November and had AKI at the time.  On last check in December her Cr was close to her baseline.  She reports eating well and not having any issues with her medications.  She denies palpitations.    Review of Systems  Constitutional: Negative for activity change, chills and fever.  Respiratory: Negative for chest tightness and shortness of breath.   Cardiovascular: Positive for leg swelling. Negative for chest pain and palpitations.  Gastrointestinal: Positive for diarrhea. Negative for  abdominal distention.  Genitourinary: Negative for difficulty urinating.  Neurological: Positive for weakness (left leg due to pain. ). Negative for dizziness, speech difficulty and light-headedness.       Objective:   Physical Exam  Constitutional: No distress.  Thin elderly woman  HENT:  Head: Normocephalic and atraumatic.  Cardiovascular: Normal rate, regular rhythm and normal heart sounds.   No murmur heard. + JVD  Pulmonary/Chest: Effort normal and breath sounds normal. No respiratory distress. She has no rales.  Abdominal: Soft. Bowel sounds are normal.  Musculoskeletal: She exhibits edema (non pitting circumferential, warmth equal on bilateral legs) and tenderness.  Neurological: She is alert.  Skin: She is not diaphoretic.  Cool extremities  Psychiatric: She has a normal mood and affect. Her behavior is normal.    BMET, BNP, CBC today      Assessment & Plan:  RTC in 1 week.

## 2016-05-26 ENCOUNTER — Other Ambulatory Visit: Payer: Self-pay | Admitting: *Deleted

## 2016-05-26 NOTE — Assessment & Plan Note (Addendum)
I think this is her most likely diagnosis at this time.  It is very unclear what dose of lasix she is taking, she does not know.  Her daughter is very involved in her care and we have discussed her medications at length in previous visits.  I am relatively certain that her daughter is filling her pill box correctly, but [name] is then responsible to take the medication.  She further had a dietary indescretion of going to [place] and likely eating a heavy salt meal.  She has increased weight, JVD, increased swelling in her legs.  Last TTE showed an EF around 40%.  Her extremities are cold today.  I am concerned she is having acute heart failure.  She has no orthopnea, PND or SOB. Her BP is actually high.  I think she can try and treat this at home with family support.  I gave [name] clear instructions and also wrote them clearly on her AVS.  I asked that she give this paperwork to her daughter (in actuality, [name]' daughter was taken tot he ED, my nurse Holley Raring took [name] to her daughter and I asked Holley Raring to make sure the paperwork was handed off).    BNP elevated to 1200 (though lower than last admission), renal function improved.   Plan Increase lasix to 80mg  BID for 1 week Follow a low salt diet Return in 1 week for weight check and evaluation of edema Present to the ED if she should worsen or have SOB  [name] may need a plan to weight herself daily and manage her diuretics depending on weight.  She is not doing that currently.  Further plans in 1 week.

## 2016-05-26 NOTE — Patient Outreach (Signed)
[facility]) Care Management   05/26/2016  [name] 27-Aug-1925 [id number]  [name] is an 83 y.o. female  Subjective:   Member report that she has been doing well, had visit with primary MD yesterday.  She complains of pain in her left leg, unable to rate.  She denies any shortness of breath or chest discomfort.  Legs remain swollen, does not have compression stockings on today, legs not elevated.  She state that her Lasix was increased, follow up appointment next week.  She report compliance with medications, however is not aware if her daughter adjusted the Lasix dose in her pill box as her daughter was in the ED yesterday during member's office visit.  Objective:   Review of Systems  Constitutional: Negative.   HENT: Negative.   Eyes: Negative.   Respiratory: Negative.   Cardiovascular: Positive for leg swelling.  Gastrointestinal: Negative.   Genitourinary: Negative.   Musculoskeletal: Negative.   Skin: Negative.   Neurological: Negative.   Endo/Heme/Allergies: Negative.   Psychiatric/Behavioral: Negative.     Physical Exam  Constitutional: She is oriented to person, place, and time. She appears well-developed and well-nourished.  Neck: Normal range of motion.  Cardiovascular: Normal rate, regular rhythm and normal heart sounds.   Respiratory: Effort normal and breath sounds normal.  GI: Soft. Bowel sounds are normal.  Musculoskeletal: Normal range of motion.  Neurological: She is alert and oriented to person, place, and time.  Skin: Skin is warm and dry.   BP 112/62   Pulse 66   Resp 18   Wt 125 lb (56.7 kg)   SpO2 96%   BMI 24.41 kg/m   Encounter Medications:   Outpatient Encounter Prescriptions as of 05/26/2016  Medication Sig  . albuterol (PROVENTIL HFA;VENTOLIN HFA) 108 (90 Base) MCG/ACT inhaler Inhale 2 puffs into the lungs every 6 (six) hours as needed for wheezing or shortness of breath.  Marland Kitchen aspirin EC 81 MG tablet Take 81 mg  by mouth daily.  Marland Kitchen dextromethorphan-guaiFENesin (MUCINEX DM) 30-600 MG 12hr tablet Take 1 tablet by mouth 2 (two) times daily. (Patient not taking: Reported on 04/28/2016)  . diclofenac sodium (VOLTAREN) 1 % GEL Apply 2 g topically 4 (four) times daily. To left shoulder. (Patient taking differently: Apply 2 g topically 4 (four) times daily as needed (pain). To left shoulder.)  . furosemide (LASIX) 20 MG tablet Take 3 tabs ('60mg'$ ) every morning and 2 tabs ('40mg'$ ) every afternoon.  Marland Kitchen lisinopril (PRINIVIL,ZESTRIL) 20 MG tablet TAKE 1 TABLET BY MOUTH DAILY  . loperamide (IMODIUM) 2 MG capsule Take 1 capsule (2 mg total) by mouth as needed for diarrhea or loose stools.  . metoprolol (LOPRESSOR) 100 MG tablet TAKE 1 TABLET(100 MG) BY MOUTH TWICE DAILY  . Nutritional Supplements ([place]) LIQD Take 237 mLs by mouth 3 (three) times daily.  . polyethylene glycol (MIRALAX / GLYCOLAX) packet Take 17 g by mouth daily as needed for mild constipation.  . potassium chloride 20 MEQ/15ML (10%) SOLN TAKE 15 MLS BY MOUTH DAILY   No facility-administered encounter medications on file as of 05/26/2016.     Functional Status:   In your present state of health, do you have any difficulty performing the following activities: 05/25/2016 04/28/2016  Hearing? Tempie Donning  Vision? Y Y  Difficulty concentrating or making decisions? Tempie Donning  Walking or climbing stairs? Y Y  Dressing or bathing? N N  Doing errands, shopping? Tempie Donning  Preparing Food and eating ? -  N  Using the Toilet? - Y  In the past six months, have you accidently leaked urine? - Y  Do you have problems with loss of bowel control? - Y  Managing your Medications? - Y  Managing your Finances? - N  Housekeeping or managing your Housekeeping? - N  Some recent data might be hidden    Fall/Depression Screening:    PHQ 2/9 Scores 05/25/2016 04/28/2016 04/14/2016 02/17/2016 01/28/2016 12/17/2015 10/07/2015  PHQ - 2 Score 0 0 0 0 0 0 0    Assessment:    Met  with member at scheduled time.  Upon arrival, member eating a snack, which is a bowl of pickles.  Appropriate low sodium diet discussed.  She state that she is aware of appropriate foods to eat, but will still have some foods that are high in sodium.  She has several canned foods (vegetables) in her kitchen, advised to rinse prior to cooking.  She verbalizes understanding.    Member has not been weighing self daily, state that she does it periodically.  She has an outdated scale, expresses difficulty reading.  She agrees that new digital scale would be better for her to use and would increase compliance.  Will discuss with daughter.  Assisted with weight today, weight 125 pounds, documented on weight log.  Pill box reviewed for accuracy of Lasix change, still has old dose.  This care manager attempted to correct pill box for the next week, but she is unable to find her medications as her daughter manages medications.  She denies any concerns.  Reminded to weight daily and record readings, strongly advised to adhere to low sodium diet.  After visit, call placed to daughter to discuss visit.  She state she will help member with management of diet, reminded to adjust Lasix dose in pill box.  She is made aware that the member is in need of new scale, report that she will assist member with obtaining one.  She denies questions, advised to contact this care manager with concerns.  Plan:   Will follow up with member next month with routine home visit.  THN CM Care Plan Problem One   Flowsheet Row Most Recent Value  Care Plan Problem One  Risk for hospital admission related to heart failure management as evidenced by member's stated inappropriate diet  Role Documenting the Problem One  Care Management Ester for Problem One  Active  THN Long Term Goal (31-90 days)  Member will be able to verbalize 3 foods that are appropriate for heart failure diet.  THN Long Term Goal Start Date  05/26/16   Interventions for Problem One Long Term Goal  Educated member on appropriate foods for low sodium diet.  Assigned & viewed EMMI video on sodium management.    [facility] CM Care Plan Problem Two   Flowsheet Row Most Recent Value  Care Plan Problem Two  Risk for hospitalization related to heart failure management as evidenced by leg edema  Role Documenting the Problem Two  Care Management Coordinator  Care Plan for Problem Two  Active  Interventions for Problem Two Long Term Goal   Re-educated member and daughter on interventions to decrease risk of admission (daily weights, diet, exercise, elevating legs, medicatioin compliance, and MD follow up)  THN Long Term Goal (31-90) days  Member will not be admitted to hospital for heart failure related complications within the next 31 days  THN Long Term Goal Start Date  04/28/16  [facility]  Long Term Goal Met Date  05/26/16  THN CM Short Term Goal #1 (0-30 days)  Member will report wearing compression stockings daily over the next 4 weeks  THN CM Short Term Goal #1 Start Date  05/26/16 [Goal not met, date reset]  Interventions for Short Term Goal #2   Re-educted daughter on the importance of member wearing compression stockings to relieve lower extremity swelling.  Also advised of the importance of having stockings of appropriate size that member will be able to apply independently.  THN CM Short Term Goal #2 (0-30 days)  Member will weigh self daily and record readings over the next 4 weeks  THN CM Short Term Goal #2 Start Date  05/26/16 [Goal not met, date reset]  Interventions for Short Term Goal #2  Re-educated member and daughter on the importance of daily weights and its relation to monitoring fluid status.  Daughter advised to purchase newer scale that is easier for member to use.       [name], [place], MSN [place] [phone]

## 2016-05-26 NOTE — Assessment & Plan Note (Signed)
She needs a repeat CXR to ensure that this area of consolidation has improved.  She had no crackles/rales or wheezing on exam.  Order at next visit.

## 2016-05-26 NOTE — Assessment & Plan Note (Signed)
CBC checked to evaluation, platelets improved from 80 to 125.  She has no signs/symptoms of bleeding today.  She does bruise easily however, Continue to monitor yearly.

## 2016-05-26 NOTE — Assessment & Plan Note (Signed)
BP elevated today, likely related to volume overload.   Increase lasix as noted above.   Continue lisinopril, metoprolol

## 2016-05-27 ENCOUNTER — Encounter: Payer: Self-pay | Admitting: Internal Medicine

## 2016-05-27 ENCOUNTER — Ambulatory Visit (INDEPENDENT_AMBULATORY_CARE_PROVIDER_SITE_OTHER): Payer: Medicare HMO | Admitting: Internal Medicine

## 2016-05-27 VITALS — BP 110/60 | HR 70 | Ht 60.0 in | Wt 126.0 lb

## 2016-05-27 DIAGNOSIS — I4891 Unspecified atrial fibrillation: Secondary | ICD-10-CM

## 2016-05-27 DIAGNOSIS — I10 Essential (primary) hypertension: Secondary | ICD-10-CM

## 2016-05-27 DIAGNOSIS — I5032 Chronic diastolic (congestive) heart failure: Secondary | ICD-10-CM

## 2016-05-27 NOTE — Patient Instructions (Addendum)
Medication Instructions:  Your physician recommends that you continue on your current medications as directed. Please refer to the Current Medication list given to you today.   Labwork: None Ordered   Testing/Procedures: None Ordered   Follow-Up: Your physician wants you to follow-up in: 6 month in [facility] and 1 year with Dr. Lovena Le. You will receive a reminder letter in the mail two months in advance. If you don't receive a letter, please call our office to schedule the follow-up appointment.     Any Other Special Instructions Will Be Listed Below (If Applicable).     If you need a refill on your cardiac medications before your next appointment, please call your pharmacy.

## 2016-05-27 NOTE — Progress Notes (Signed)
PCP: Gilles Chiquito, MD  [name] is a 83 y.o. female who presents today for routine electrophysiology followup. She was hospitalized in November with CHF. Since then, she has done well, and denies syncope, and denies chest pain or shortness of breath. She is bothered by peripheral edema. She has venous insufficiency.  Past Medical History:  Diagnosis Date  . Adenomatous colon polyp    tubular  . Allergic rhinitis   . Anemia    Iron deficiency, does not tolerate po  . Arthritis    "all over my body"  . Atrial fibrillation (HCC)    on coumadin followed by Dr. Elie Confer  . CHF (congestive heart failure) (HCC)    diastolic dysfunction  . Constipation   . Dementia   . Diverticulosis of colon (without mention of hemorrhage)   . DJD (degenerative joint disease)   . GERD (gastroesophageal reflux disease)   . History of sick sinus syndrome s/p pacemaker Dr. Lovena Le 2000  . History of trichomonal urethritis   . History of vaginal bleeding 05/2005   nagative endometrial biopsy  . Hypertension    well controlled on 3 agents  . Incontinence overflow, stress female    on vesicare  . Onychomycosis   . Osteoporosis   . Pacemaker   . Restrictive lung disease 1996   PFT's showed mild disease  . Stroke [facility]) 2002   R MCA, cardioembolic  . Type II diabetes mellitus (HCC)    diet controlled  . Weight loss, unintentional 2010-2011   8/08-8/10 138-146 lbs, between 8/10-10/11 lost 18 lbs., work-up negative   Past Surgical History:  Procedure Laterality Date  . ABDOMINAL HYSTERECTOMY    . CATARACT EXTRACTION    . CHOLECYSTECTOMY    . COLONOSCOPY    . ESOPHAGOGASTRODUODENOSCOPY    . INSERT / REPLACE / REMOVE PACEMAKER  2000   MDT Sigma pacemaker implanted by Dr Lovena Le;  Marland Kitchen PACEMAKER GENERATOR CHANGE  10/2012   AdaptaL 10/2012 by Dr Lovena Le  . PACEMAKER GENERATOR CHANGE N/A 11/02/2012   Procedure: PACEMAKER GENERATOR CHANGE;  Surgeon: Evans Lance, MD;  Location: [facility] C[facility] LAB;  Service:  Cardiovascular;  Laterality: N/A;    Current Outpatient Prescriptions  Medication Sig Dispense Refill  . albuterol (PROVENTIL HFA;VENTOLIN HFA) 108 (90 Base) MCG/ACT inhaler Inhale 2 puffs into the lungs every 6 (six) hours as needed for wheezing or shortness of breath. 1 Inhaler 2  . aspirin EC 81 MG tablet Take 81 mg by mouth daily.    Marland Kitchen dextromethorphan-guaiFENesin (MUCINEX DM) 30-600 MG 12hr tablet Take 1 tablet by mouth 2 (two) times daily. 20 tablet 0  . diclofenac sodium (VOLTAREN) 1 % GEL Apply 2 g topically 4 (four) times daily. To left shoulder. (Patient taking differently: Apply 2 g topically 4 (four) times daily as needed (pain). To left shoulder.) 100 g 1  . furosemide (LASIX) 20 MG tablet Take 4 tablets (80 mg) in the mornings and 2 tablets (40 mg) in the evenings by mouth.    Marland Kitchen lisinopril (PRINIVIL,ZESTRIL) 20 MG tablet TAKE 1 TABLET BY MOUTH DAILY 30 tablet 5  . loperamide (IMODIUM) 2 MG capsule Take 1 capsule (2 mg total) by mouth as needed for diarrhea or loose stools. 30 capsule 0  . metoprolol (LOPRESSOR) 100 MG tablet TAKE 1 TABLET(100 MG) BY MOUTH TWICE DAILY 60 tablet 0  . Nutritional Supplements ([place]) LIQD Take 237 mLs by mouth 3 (three) times daily. 90 Bottle 0  . polyethylene  glycol (MIRALAX / GLYCOLAX) packet Take 17 g by mouth daily as needed for mild constipation. 14 each 0  . potassium chloride 20 MEQ/15ML (10%) SOLN TAKE 15 MLS BY MOUTH DAILY 473 mL 3   No current facility-administered medications for this visit.     Physical Exam: Vitals:   05/27/16 0933  BP: 110/60  Pulse: 70  Weight: 126 lb (57.2 kg)  Height: 5' (1.524 m)    GEN- The patient is elderly appearing, alert and oriented x 3 today.   Head- normocephalic, atraumatic Eyes-  Sclera clear, conjunctiva pink Ears- hearing intact Oropharynx- clear Lungs- Clear to ausculation bilaterally, normal work of breathing Chest- pacemaker pocket is well healed Heart- IRegular rate  and rhythm, no murmurs, rubs or gallops, PMI not laterally displaced GI- soft, NT, ND, + BS Extremities- no clubbing, cyanosis, or edema  Pacemaker interrogation- reviewed in detail today,  See PACEART report   Assessment and Plan: 1. Chronic atrial fib - as time has gone by, her rate has been easier to control. She is unable to take systemic anti-coagulation due to GI bleeding and falls. 2. Stroke - she has residual right HP. 3. HTN heart disease - she finally has well controlled BP. 4. Chronic diastolic heart failure -her symptoms remain class 2. I have encouraged her to continue her diuretic dose and maintain a low sodium diet.  Mikle Bosworth.D.

## 2016-05-30 LAB — CUP PACEART INCLINIC DEVICE CHECK
Battery Impedance: 578 Ohm
Implantable Lead Location: 753860
Implantable Pulse Generator Implant Date: 20140718
Lead Channel Impedance Value: 0 Ohm
Lead Channel Pacing Threshold Amplitude: 0.5 V
Lead Channel Pacing Threshold Amplitude: 0.5 V
Lead Channel Pacing Threshold Pulse Width: 0.4 ms
Lead Channel Pacing Threshold Pulse Width: 0.4 ms
Lead Channel Setting Sensing Sensitivity: 2.8 mV
MDC IDC LEAD IMPLANT DT: 20001214
MDC IDC MSMT BATTERY REMAINING LONGEVITY: 83 mo
MDC IDC MSMT BATTERY VOLTAGE: 2.78 V
MDC IDC MSMT LEADCHNL RV IMPEDANCE VALUE: 598 Ohm
MDC IDC MSMT LEADCHNL RV SENSING INTR AMPL: 11.2 mV
MDC IDC SESS DTM: 20180209155211
MDC IDC SET LEADCHNL RV PACING AMPLITUDE: 2 V
MDC IDC SET LEADCHNL RV PACING PULSEWIDTH: 0.4 ms
MDC IDC STAT BRADY RV PERCENT PACED: 51 %

## 2016-06-01 ENCOUNTER — Encounter: Payer: Self-pay | Admitting: Internal Medicine

## 2016-06-01 ENCOUNTER — Telehealth: Payer: Self-pay | Admitting: *Deleted

## 2016-06-01 ENCOUNTER — Ambulatory Visit: Payer: Medicare HMO

## 2016-06-01 NOTE — Telephone Encounter (Signed)
Thank you :)

## 2016-06-01 NOTE — Telephone Encounter (Signed)
-----   Message from Sid Falcon, MD sent at 06/01/2016  2:51 PM EST ----- Yes, thank you! ----- Message ----- From: Ebbie Latus, RN Sent: 06/01/2016   2:47 PM To: Sid Falcon, MD  Dr Daryll Drown Do u want her to f/u CHF/leg edema/weight gain? So I can let her/daughter know when I call. Thanks ----- Message ----- From: Sid Falcon, MD Sent: 06/01/2016   2:18 PM To: Ebbie Latus, RN, [employer]  Can you please have Ms. Melfi come in to [facility] in the next week.   Thanks!

## 2016-06-01 NOTE — Telephone Encounter (Signed)
I called [name], pt daughter then called the pt.; no answer - left messages on [name]'s and pt's voice mail to give [place] a call back to schedule a f/u appt next week (in [facility]) per Dr Daryll Drown.

## 2016-06-02 NOTE — Telephone Encounter (Signed)
Per EPIC, pt has has an appt on the 22nd.

## 2016-06-09 ENCOUNTER — Ambulatory Visit: Payer: Medicare HMO

## 2016-06-13 ENCOUNTER — Telehealth: Payer: Self-pay | Admitting: Internal Medicine

## 2016-06-13 NOTE — Telephone Encounter (Signed)
APT. REMINDER CALL, LMTCB °

## 2016-06-14 ENCOUNTER — Encounter: Payer: Self-pay | Admitting: Internal Medicine

## 2016-06-14 ENCOUNTER — Ambulatory Visit (INDEPENDENT_AMBULATORY_CARE_PROVIDER_SITE_OTHER): Payer: Medicare HMO | Admitting: Internal Medicine

## 2016-06-14 ENCOUNTER — Other Ambulatory Visit: Payer: Self-pay | Admitting: Internal Medicine

## 2016-06-14 VITALS — BP 144/74 | HR 98 | Temp 98.0°F | Wt 128.0 lb

## 2016-06-14 DIAGNOSIS — Z79899 Other long term (current) drug therapy: Secondary | ICD-10-CM

## 2016-06-14 DIAGNOSIS — I5043 Acute on chronic combined systolic (congestive) and diastolic (congestive) heart failure: Secondary | ICD-10-CM

## 2016-06-14 DIAGNOSIS — Z9119 Patient's noncompliance with other medical treatment and regimen: Secondary | ICD-10-CM | POA: Diagnosis not present

## 2016-06-14 MED ORDER — FUROSEMIDE 20 MG PO TABS: 160.0000 mg | ORAL_TABLET | Freq: Every morning | ORAL | 0 refills | Status: DC

## 2016-06-14 NOTE — Progress Notes (Signed)
CC: follow-up of acute on chronic combined congestive heart failure  HPI:  [name] is a 83 y.o. F who presents to the acute care clinic for CHF re-evaluation. She was seen by PCP 05/26/16 at which time she had been having increased BL LE swelling, 11 lb weight gain and hypertension. She was managed for acute on chronic CHF (thought to be due to intermittent lasix use and dietary indiscretion) with Lasix 80 mg twice a day for one week. She was seen by her cardiologist 1 day later who continued her initial dose of lasix and therefore the patient did not take 80 mg BID. She was instructed to follow-up in clinic in 1 week however did not do so. Today she reports her lower extremity swelling is improved however is still present and seems to wax and wane. In addition, she notes abdominal fullness as well. She reports she weighs herself daily and is compliant with low sodium diet. She has [facility] RN that comes to her house monthly who notes several inconsistencies in the patients story (does not weigh, does not record, was found eating bowl of pickles).   Past Medical History:  Diagnosis Date  . Adenomatous colon polyp    tubular  . Allergic rhinitis   . Anemia    Iron deficiency, does not tolerate po  . Arthritis    "all over my body"  . Atrial fibrillation (HCC)    on coumadin followed by Dr. Elie Confer  . CHF (congestive heart failure) (HCC)    diastolic dysfunction  . Constipation   . Dementia   . Diverticulosis of colon (without mention of hemorrhage)   . DJD (degenerative joint disease)   . GERD (gastroesophageal reflux disease)   . History of sick sinus syndrome s/p pacemaker Dr. Lovena Le 2000  . History of trichomonal urethritis   . History of vaginal bleeding 05/2005   nagative endometrial biopsy  . Hypertension    well controlled on 3 agents  . Incontinence overflow, stress female    on vesicare  . Onychomycosis   . Osteoporosis   . Pacemaker   . Restrictive lung disease 1996     PFT's showed mild disease  . Stroke [facility]) 2002   R MCA, cardioembolic  . Type II diabetes mellitus (HCC)    diet controlled  . Weight loss, unintentional 2010-2011   8/08-8/10 138-146 lbs, between 8/10-10/11 lost 18 lbs., work-up negative   Review of Systems:  Review of Systems  Constitutional: Negative for chills and fever.  Respiratory: Negative for cough, shortness of breath and wheezing.   Cardiovascular: Positive for leg swelling. Negative for chest pain and palpitations.  Gastrointestinal: Positive for abdominal pain. Negative for blood in stool, constipation, diarrhea, melena, nausea and vomiting.  Genitourinary: Positive for frequency.  Neurological: Negative for dizziness, weakness and headaches.   Physical Exam: Physical Exam  Constitutional: She appears well-developed and well-nourished. She does not appear ill. No distress.  HENT:  Head: Normocephalic and atraumatic.  Eyes:  BL senile arcus  Cardiovascular: An irregularly irregular rhythm present.  Murmur heard.  Systolic murmur is present  Pulmonary/Chest: Effort normal and breath sounds normal. No respiratory distress. She has no wheezes. She has no rales.  Abdominal: Soft. Bowel sounds are normal. She exhibits distension. There is no tenderness. There is no guarding.  Musculoskeletal: She exhibits edema (1-2 + pitting edema).  Skin: She is not diaphoretic.  Psychiatric: She has a normal mood and affect. Her behavior is normal.  Vitals:   06/14/16 1015  BP: (!) 144/74  Pulse: 98  Temp: 98 F (36.7 C)  TempSrc: Oral  SpO2: 100%  Weight: 128 lb (58.1 kg)   Assessment & Plan:   See Encounters Tab for problem based charting.  Patient discussed with Dr. Lynnae January

## 2016-06-14 NOTE — Patient Instructions (Addendum)
It was a pleasure seeing you again today! I'm sorry you are still having issues with your fluid balance. It is important that we get the remaining fluid off of you.  I am increasing your lasix dose to 160 mg (8 tablets) every morning for 1 week. You will only take this medication once daily over the next week. I also need you to weigh yourself daily and record these values. Please bring them in to your next visit.   Please limit your sodium/salt intake. It is important that you follow a low sodium diet consistently to help maintain your fluid levels.  I will contact your [facility] nurse to come by more frequently over the next week or so to help you with this.

## 2016-06-14 NOTE — Progress Notes (Signed)
Internal Medicine Clinic Attending  Case discussed with Dr. Molt at the time of the visit.  We reviewed the resident's history and exam and pertinent patient test results.  I agree with the assessment, diagnosis, and plan of care documented in the resident's note. 

## 2016-06-14 NOTE — Assessment & Plan Note (Addendum)
Weight 128 lbs today. Dry weight appears to be 125 lbs per chart review. Has been some improvement in volume status since last visit however patient does have persistent BL LE pitting edema. In addition, she notices abdominal fullness and distention. She was non-compliant with prescribed dose of lasix due misunderstanding however has been taking Lasix 80 mg QAM and 40 mg QPM. She reports shes been weighing herself daily and has followed a low sodium diet. Daughter who was present in room indicated that this is not so. Lungs are clear and she denies any SOB.  -Increase lasix to 160 mg QAM (only 1 dose per day) for 1 week  -Patient to return to clinic at that time for re-evaluation -Encouraged compliance with medication, low sodium diet and fluid management -Will contact [facility] RN for more frequent visits this week to help ensure compliance

## 2016-06-15 ENCOUNTER — Other Ambulatory Visit: Payer: Self-pay | Admitting: *Deleted

## 2016-06-15 NOTE — Patient Outreach (Signed)
[facility]) Care Management  06/15/2016  JAIYA ACEVES [birth year] [id number]   Message received from [facility] primary MD office (Dr. Danford Bad) requesting assistance with management of fluid overload.  Member was seen in office on yesterday for congestive heart failure.  Per note, Lasix dose has been changed and member (along with daughter) has been educated on proper diet, medication management, and daily weights.   Call placed to member to follow up on MD's concerns, home visit scheduled for tomorrow.    [name], [place], MSN [place] [phone]

## 2016-06-16 ENCOUNTER — Telehealth: Payer: Self-pay

## 2016-06-16 ENCOUNTER — Other Ambulatory Visit: Payer: Self-pay | Admitting: *Deleted

## 2016-06-16 NOTE — Telephone Encounter (Signed)
Monica from [facility] needs to speak with a nurse about pt.

## 2016-06-16 NOTE — Patient Outreach (Addendum)
[facility]) Care Management   06/16/2016  [name] 02/19/1926 [id number]  [name] is an 83 y.o. female  Subjective:   Member report that she is doing "alright."  She complains of some soreness and pain in her right leg as well as in her abdomen.    Objective:   Review of Systems  Constitutional: Negative.   HENT: Negative.   Eyes: Negative.   Respiratory: Negative.   Cardiovascular: Positive for leg swelling.  Gastrointestinal: Positive for abdominal pain.  Genitourinary: Negative.   Musculoskeletal: Negative.   Skin: Negative.   Neurological: Negative.   Endo/Heme/Allergies: Negative.   Psychiatric/Behavioral: Negative.     Physical Exam  Constitutional: She is oriented to person, place, and time. She appears well-developed and well-nourished.  Neck: Normal range of motion.  Cardiovascular: Normal rate and normal heart sounds.   Respiratory: Effort normal and breath sounds normal.  GI: Soft. Bowel sounds are normal.  Musculoskeletal: Normal range of motion.  Neurological: She is alert and oriented to person, place, and time.  Skin: Skin is warm and dry.   BP (!) 90/58 (BP Location: Right Arm, Patient Position: Sitting, Cuff Size: Normal)   Pulse 63   SpO2 96%   Encounter Medications:   Outpatient Encounter Prescriptions as of 06/16/2016  Medication Sig  . aspirin EC 81 MG tablet Take 81 mg by mouth daily.  . diclofenac sodium (VOLTAREN) 1 % GEL Apply 2 g topically 4 (four) times daily. To left shoulder. (Patient taking differently: Apply 2 g topically 4 (four) times daily as needed (pain). To left shoulder.)  . furosemide (LASIX) 20 MG tablet Take 8 tablets (160 mg total) by mouth every morning. Take 8 tablets by mouth every morning. Do NOT take an evening dose.  Marland Kitchen lisinopril (PRINIVIL,ZESTRIL) 20 MG tablet TAKE 1 TABLET BY MOUTH DAILY  . loperamide (IMODIUM) 2 MG capsule Take 1 capsule (2 mg total) by mouth as needed for diarrhea or  loose stools.  . metoprolol (LOPRESSOR) 100 MG tablet TAKE 1 TABLET(100 MG) BY MOUTH TWICE DAILY  . Nutritional Supplements ([place]) LIQD Take 237 mLs by mouth 3 (three) times daily.  . polyethylene glycol (MIRALAX / GLYCOLAX) packet Take 17 g by mouth daily as needed for mild constipation.  . potassium chloride 20 MEQ/15ML (10%) SOLN TAKE 15 MLS BY MOUTH DAILY  . albuterol (PROVENTIL HFA;VENTOLIN HFA) 108 (90 Base) MCG/ACT inhaler Inhale 2 puffs into the lungs every 6 (six) hours as needed for wheezing or shortness of breath. (Patient not taking: Reported on 06/16/2016)  . dextromethorphan-guaiFENesin (MUCINEX DM) 30-600 MG 12hr tablet Take 1 tablet by mouth 2 (two) times daily. (Patient not taking: Reported on 06/16/2016)   No facility-administered encounter medications on file as of 06/16/2016.     Functional Status:   In your present state of health, do you have any difficulty performing the following activities: 06/14/2016 05/25/2016  Hearing? Tempie Donning  Vision? Y Y  Difficulty concentrating or making decisions? Tempie Donning  Walking or climbing stairs? Y Y  Dressing or bathing? N N  Doing errands, shopping? Y Y  Preparing Food and eating ? - -  Using the Toilet? - -  In the past six months, have you accidently leaked urine? - -  Do you have problems with loss of bowel control? - -  Managing your Medications? - -  Managing your Finances? - -  Housekeeping or managing your Housekeeping? - -  Some recent  data might be hidden    Fall/Depression Screening:    PHQ 2/9 Scores 06/14/2016 05/25/2016 04/28/2016 04/14/2016 02/17/2016 01/28/2016 12/17/2015  PHQ - 2 Score 0 0 0 0 0 0 0    Assessment:    Home visit with member as requested by primary MD, Dr. Danford Bad, after medication changes on Tuesday.  Daughter, [name], present during visit.  She remains with leg and abdominal swelling, weight today 129 pounds.  She report compliance with increased dose of medication, daughter confirms.  Pill box  checked, correct dosage of medications in box.    Daughter state that she will restart daily weights with member, discussed again the importance of obtaining a new scale as member is not weighing self due to difficulty reading current scale.  Daughter verbalizes understanding, state she will get a new one today.    Member remains noncompliant with diet.  Noted to be eating some [employer] Red Hot Sausages today.  She report that she has decreased the amount of pickles, but continue to eat an excess amount of sausages and hot dogs.  Advised against eating foods high in sodium, including the pickles, hot dogs and sausages.  She state that she will not be able to cut them out completely from her diet, but will consider decreasing the amount.    Blood pressure low upon assessment (84/58 in left arm, 90/58 in right arm).  She report some dizziness over the past couple days, no falls.  Advised to continue to use cane for mobilization.  She is advised to elevate legs when resting.    Call placed to primary MD office, message left, requesting nurse to call back to discuss low BP.  Notes left for daughter reminding her of the need for a new scale, request for blood pressure monitor to monitor hypotension, and new compression stockings.  Member denies any questions at this time.  Plan:   Will await call back from MD office. Will follow up next week with routine home visit.     Update @ 1820:  No call back from MD office at this time.  Message was also sent to MD, Dr. Danford Bad, regarding low BP, still awaiting response.  Call placed to [facility] daughter, [name], to report assessment findings today as she was not present during vital signs, and to discuss plan.  Advised not to take Metoprolol tonight and this care manager will contact MD office tomorrow morning to inquire about potential medication changes (possibly changing 160 mg Lasix daily to 80 mg BID).  She state that she will be spending the night with the  member tonight and will await further instructions.  Will provide update tomorrow once MD is contacted.   [name], [place], MSN [place] [phone]

## 2016-06-17 ENCOUNTER — Other Ambulatory Visit: Payer: Self-pay | Admitting: *Deleted

## 2016-06-17 NOTE — Patient Outreach (Addendum)
[facility]) Care Management  06/17/2016  [name] 1925/08/02 [id number]   Call placed to primary MD office, spoke to H. Buena Irish to report member's low blood pressure during yesterday's home visit.  Will await call back, then will contact member to discuss plan and/or recommendations.    Update at 1430:  Call received back from Ms. Buena Irish.  Member's Lasix will be decreased to 80 mg daily, and request has been made for member to be seen in office either today or Monday.  She had not been able to contact member or daughter, therefore appointment scheduled for Monday morning.  Ms. Buena Irish will continue to try to reach member as with this care manager.    Update @ 1540:  Prepared to contact member/daughter to discuss plan.  Noted that daughter has already spoken to [place].  Will follow up next week.    [name], [place], MSN [place] [phone]

## 2016-06-17 NOTE — Telephone Encounter (Signed)
Called pt, called sylvia- caretaker and daughter- left urgent message for both to call clinic asap Called [facility] and relayed to her to cut lasix to 80mg  daily and appt made for mon 3/5 at 1015, she will try to reach pt and caretaker also

## 2016-06-17 NOTE — Telephone Encounter (Signed)
Spoke w/ sylvia, gave her instructions twice and she repeated back to me, she will bring her Monday at 1015 [facility], she cannot bring her today as she is out of town

## 2016-06-17 NOTE — Telephone Encounter (Signed)
She needs to come in and be seen.  We need to discuss goals of care with her and family.  Can you please get her an [facility] appointment today, if they cannot come in, may need ED visit.  Thanks

## 2016-06-17 NOTE — Telephone Encounter (Signed)
Monica from [facility] calls and states pt's bp after starting new med regimen after visit was  R 90/58 L 84/58 Pt reports dizziness to the point of using 2 canes instead of her usual 1 cane Noncompliant with diet Noncompliant with daily weights due to not having a digital scale Pt is compliant with meds Please advise

## 2016-06-17 NOTE — Telephone Encounter (Signed)
Discussed further with nurse Freddy Finner - -   Patient should try to come in this afternoon, if not able, would decrease lasix to 80mg  daily over the weekend  And come in to the clinic on Monday for evaluation.    If she develops chest pain, SOB, worsening swelling, worsening dizziness, uncontrollable pain, she should go to the ED.

## 2016-06-20 ENCOUNTER — Ambulatory Visit (INDEPENDENT_AMBULATORY_CARE_PROVIDER_SITE_OTHER): Payer: Medicare HMO | Admitting: Internal Medicine

## 2016-06-20 DIAGNOSIS — Z79899 Other long term (current) drug therapy: Secondary | ICD-10-CM | POA: Diagnosis not present

## 2016-06-20 DIAGNOSIS — I5032 Chronic diastolic (congestive) heart failure: Secondary | ICD-10-CM

## 2016-06-20 DIAGNOSIS — K5900 Constipation, unspecified: Secondary | ICD-10-CM | POA: Diagnosis not present

## 2016-06-20 MED ORDER — FUROSEMIDE 20 MG PO TABS: ORAL_TABLET | ORAL | 0 refills | Status: DC

## 2016-06-20 MED ORDER — DOCUSATE SODIUM 100 MG PO CAPS: 100.0000 mg | ORAL_CAPSULE | Freq: Every day | ORAL | 1 refills | Status: AC | PRN

## 2016-06-20 NOTE — Patient Instructions (Signed)
Continue taking lasix 3 tablets in the morning and 2 tablets in the evening. If you noticed 3-4 lbs of weight gain or shortness of breath call the clinic immediately.

## 2016-06-20 NOTE — Progress Notes (Signed)
   CC: CHF  HPI:  [name] is a 83 y.o. with PMHx as outlined below who presents to clinic for CHF follow up. Please see problem list for further details of patient's chronic medical issues.    Past Medical History:  Diagnosis Date  . Adenomatous colon polyp    tubular  . Allergic rhinitis   . Anemia    Iron deficiency, does not tolerate po  . Arthritis    "all over my body"  . Atrial fibrillation (HCC)    on coumadin followed by Dr. Elie Confer  . CHF (congestive heart failure) (HCC)    diastolic dysfunction  . Constipation   . Dementia   . Diverticulosis of colon (without mention of hemorrhage)   . DJD (degenerative joint disease)   . GERD (gastroesophageal reflux disease)   . History of sick sinus syndrome s/p pacemaker Dr. Lovena Le 2000  . History of trichomonal urethritis   . History of vaginal bleeding 05/2005   nagative endometrial biopsy  . Hypertension    well controlled on 3 agents  . Incontinence overflow, stress female    on vesicare  . Onychomycosis   . Osteoporosis   . Pacemaker   . Restrictive lung disease 1996   PFT's showed mild disease  . Stroke [facility]) 2002   R MCA, cardioembolic  . Type II diabetes mellitus (HCC)    diet controlled  . Weight loss, unintentional 2010-2011   8/08-8/10 138-146 lbs, between 8/10-10/11 lost 18 lbs., work-up negative    Review of Systems:  Positive for dizziness, abdominal pain 2 weeks. Denies shortness of breath and worsening pedal edema.  Physical Exam:  Vitals:   06/20/16 1120  BP: (!) 128/57  Pulse: 71  Temp: 98.3 F (36.8 C)  TempSrc: Oral  SpO2: 100%  Weight: 129 lb 6.4 oz (58.7 kg)  Height: 5' (1.524 m)   Physical Exam  Constitutional: appears well-developed and well-nourished. No distress.  HENT:  Head: Normocephalic and atraumatic.  Nose: Nose normal.  Cardiovascular: Normal rate, irregular rhythm and normal heart sounds.  Exam reveals no gallop and no friction rub.   No murmur heard. JVD  present Pulmonary/Chest: Effort normal and breath sounds normal. No respiratory distress.  has no wheezes.no rales.  Abdominal: Soft. Bowel sounds are normal.  exhibits no distension. TTP of RLQ. There is no rebound and no guarding.  GU: neg for flank pain or suprapubic tenderness to palpation.  Neurological: alert and oriented to person, place, and time.  Skin: Skin is warm and dry. No rash noted.  not diaphoretic. No erythema. No pallor.  Ext: 1+ pedal edema b/l.   Assessment & Plan:   See Encounters Tab for problem based charting.  Patient discussed with Dr. Eppie Gibson

## 2016-06-21 ENCOUNTER — Ambulatory Visit: Payer: Commercial Managed Care - HMO | Attending: Audiology | Admitting: Audiology

## 2016-06-21 NOTE — Assessment & Plan Note (Signed)
Assessment: Patient reporting right lower quadrant pain for the past 2 weeks that has not improved or worsen. She has constipation and has tried MiraLAX which does not work for her. She states stools are watery.  Plan: Rx for Colace daily which can be increased to twice a day if constipation is not improved.

## 2016-06-21 NOTE — Assessment & Plan Note (Addendum)
Assessment: Last week the patient was noted to have symptomatic low blood pressures by her home health nurse. Due to this her Lasix dose was changed from 160 mg to 80 mg daily. Patient is currently taking 60 mg in the morning and 40 mg in the evening. She still reports intermittent dizziness. Her blood pressure is stable today. She does have JVD lungs are clear. Pedal edema is stable. She is 1 pound from her clinic visit in February. Her dry weight is 125.  Plan: Continue Lasix 60 mg morning and 40 mg the evening she is still reporting dizziness. Volume status stable. Follow-up in 2 weeks to reassess.

## 2016-06-21 NOTE — Progress Notes (Signed)
Patient ID: [name], female   DOB: Oct 30, 1925, 83 y.o.   MRN: [id number]  Case discussed with Dr. [name] at the time of the visit. We reviewed the resident's history and exam and pertinent patient test results. I agree with the assessment, diagnosis, and plan of care documented in the resident's note.

## 2016-06-22 ENCOUNTER — Ambulatory Visit: Payer: Medicare HMO

## 2016-06-23 ENCOUNTER — Other Ambulatory Visit: Payer: Self-pay | Admitting: *Deleted

## 2016-06-23 NOTE — Patient Outreach (Signed)
[facility]) Care Management   06/23/2016  CARLTON SWEANEY 03-10-1926 [id number]  DELANEE XIN is an 83 y.o. female  Subjective:   Member reports that she is "making it."  She state that she continues to have abdominal discomfort, leg swelling, and leg soreness.  She denies any dizziness at this time.  She report compliance with her medications.  She state that she is starting to attempt to follow a low sodium diet, but report that she is "tired of eating the same food."  She does report understanding of appropriate foods to eat, but still voice the desire for unhealthy foods.  Objective:   Review of Systems  Constitutional: Negative.   HENT: Negative.   Eyes: Negative.   Respiratory: Negative.   Cardiovascular: Positive for leg swelling.  Gastrointestinal: Positive for abdominal pain.  Genitourinary: Negative.   Musculoskeletal: Negative.   Skin: Negative.   Neurological: Negative.   Endo/Heme/Allergies: Negative.   Psychiatric/Behavioral: Negative.     Physical Exam  Constitutional: She is oriented to person, place, and time. She appears well-developed and well-nourished.  Neck: Normal range of motion.  Cardiovascular: Normal rate.   Irregular  Respiratory: Effort normal and breath sounds normal.  GI: Soft. Bowel sounds are normal.  Musculoskeletal: Normal range of motion.  Neurological: She is alert and oriented to person, place, and time.  Skin: Skin is warm and dry.   BP 110/62 (BP Location: Right Arm, Patient Position: Sitting, Cuff Size: Normal)   Pulse 72   Resp 18   Wt 127 lb (57.6 kg)   SpO2 95%   BMI 24.80 kg/m   Encounter Medications:   Outpatient Encounter Prescriptions as of 06/23/2016  Medication Sig  . aspirin EC 81 MG tablet Take 81 mg by mouth daily.  . diclofenac sodium (VOLTAREN) 1 % GEL Apply 2 g topically 4 (four) times daily. To left shoulder. (Patient taking differently: Apply 2 g topically 4 (four) times daily as needed  (pain). To left shoulder.)  . docusate sodium (COLACE) 100 MG capsule Take 1 capsule (100 mg total) by mouth daily as needed.  . furosemide (LASIX) 20 MG tablet Take 3 tabs in the morning and 2 tabs in the evening.  Marland Kitchen lisinopril (PRINIVIL,ZESTRIL) 20 MG tablet TAKE 1 TABLET BY MOUTH DAILY  . loperamide (IMODIUM) 2 MG capsule Take 1 capsule (2 mg total) by mouth as needed for diarrhea or loose stools.  . metoprolol (LOPRESSOR) 100 MG tablet TAKE 1 TABLET(100 MG) BY MOUTH TWICE DAILY  . Nutritional Supplements ([place]) LIQD Take 237 mLs by mouth 3 (three) times daily.  . polyethylene glycol (MIRALAX / GLYCOLAX) packet Take 17 g by mouth daily as needed for mild constipation.  . potassium chloride 20 MEQ/15ML (10%) SOLN TAKE 15 MLS BY MOUTH DAILY  . albuterol (PROVENTIL HFA;VENTOLIN HFA) 108 (90 Base) MCG/ACT inhaler Inhale 2 puffs into the lungs every 6 (six) hours as needed for wheezing or shortness of breath. (Patient not taking: Reported on 06/16/2016)  . dextromethorphan-guaiFENesin (MUCINEX DM) 30-600 MG 12hr tablet Take 1 tablet by mouth 2 (two) times daily. (Patient not taking: Reported on 06/16/2016)   No facility-administered encounter medications on file as of 06/23/2016.     Functional Status:   In your present state of health, do you have any difficulty performing the following activities: 06/20/2016 06/14/2016  Hearing? Tempie Donning  Vision? Y Y  Difficulty concentrating or making decisions? Tempie Donning  Walking or climbing stairs? Darreld Mclean  Y  Dressing or bathing? N N  Doing errands, shopping? Y Y  Preparing Food and eating ? - -  Using the Toilet? - -  In the past six months, have you accidently leaked urine? - -  Do you have problems with loss of bowel control? - -  Managing your Medications? - -  Managing your Finances? - -  Housekeeping or managing your Housekeeping? - -  Some recent data might be hidden    Fall/Depression Screening:    PHQ 2/9 Scores 06/20/2016 06/14/2016 05/25/2016  04/28/2016 04/14/2016 02/17/2016 01/28/2016  PHQ - 2 Score 0 0 0 0 0 0 0    Assessment:    Met with member at scheduled time.  Unclear if member is compliant with medications as stated, loose pills found lying on table and dresser.  Pill box full, member report daughter made visit this morning, helped with weight and medications.  Legs remain swollen, member reports soreness in both legs.  She admits that she is not consistent with elevating at rest.    Call placed to daughter during visit, informed of member's vital signs.  She report that the member had visit with primary MD on Monday, remains in fluid overload, but doing better.  Lasix dose changed back to her normal dose, 60 mg every am and 40 mg every pm.  Daughter report that she received permission to place ACE wraps on member's legs since she is unable to put on compression stockings independently.  This care manager placed ACE wraps, daughter will take off and replace daily.  Follow up appointment on 3/19.    Member and daughter denies questions, advised to contact with concerns.    Plan:   Will notify Environmental health practitioner and make request for new scale. Will follow up next month with routine home visit.  THN CM Care Plan Problem One   Flowsheet Row Most Recent Value  Care Plan Problem One  Risk for hospital admission related to heart failure management as evidenced by member's stated inappropriate diet  Role Documenting the Problem One  Care Management Coordinator  Care Plan for Problem One  Active  THN Long Term Goal (31-90 days)  Member will be able to verbalize 3 foods that are appropriate for heart failure diet.  THN Long Term Goal Start Date  05/26/16  [facility] Long Term Goal Met Date  06/23/16  Interventions for Problem One Long Term Goal  Educated member on appropriate foods for low sodium diet.  Assigned & viewed EMMI video on sodium management.  THN CM Short Term Goal #1 (0-30 days)  Member will report decrease in high  sodium foods within the next 4 weeks  THN CM Short Term Goal #1 Start Date  06/23/16  Interventions for Short Term Goal #1  Member re-educated on foods high in sodium (especially favorites such as pickles and sausages) and the importance of adhering to diet    [facility] CM Care Plan Problem Two   Flowsheet Row Most Recent Value  Care Plan Problem Two  Risk for hospitalization related to heart failure management as evidenced by leg edema  Role Documenting the Problem Two  Care Management Coordinator  Care Plan for Problem Two  Active  Interventions for Problem Two Long Term Goal   Re-educated member and daughter on interventions to decrease risk of admission (daily weights, diet, exercise, elevating legs, medicatioin compliance, and MD follow up)  THN Long Term Goal (31-90) days  Member will not be admitted to hospital  for heart failure related complications within the next 31 days  THN Long Term Goal Start Date  06/23/16 [Original goal met, still at risk for admission, new goal set]  THN CM Short Term Goal #1 (0-30 days)  Member will report wearing compression stockings daily over the next 4 weeks  THN CM Short Term Goal #1 Start Date  05/26/16 [Goal not met, date reset]  THN CM Short Term Goal #1 Met Date   -- [Goal not met]  Interventions for Short Term Goal #2   Re-educted daughter on the importance of member wearing compression stockings to relieve lower extremity swelling.  Also advised of the importance of having stockings of appropriate size that member will be able to apply independently.  THN CM Short Term Goal #2 (0-30 days)  Member will weigh self daily and record readings over the next 4 weeks  THN CM Short Term Goal #2 Start Date  06/23/16 [Goal not met, date reset]  Interventions for Short Term Goal #2  Re-educated member and daughter on the importance of daily weights and its relation to monitoring fluid status.  Request made for [facility] to purchase new scale.       [name], [place],  MSN [place] [phone]

## 2016-06-26 ENCOUNTER — Emergency Department (HOSPITAL_COMMUNITY): Payer: Medicare HMO

## 2016-06-26 ENCOUNTER — Encounter (HOSPITAL_COMMUNITY): Payer: Self-pay

## 2016-06-26 ENCOUNTER — Emergency Department (HOSPITAL_COMMUNITY)
Admission: EM | Admit: 2016-06-26 | Discharge: 2016-06-26 | Disposition: A | Discharge status: Home / Self Care | Payer: Medicare HMO | Attending: Emergency Medicine | Admitting: Emergency Medicine

## 2016-06-26 DIAGNOSIS — E1122 Type 2 diabetes mellitus with diabetic chronic kidney disease: Secondary | ICD-10-CM | POA: Diagnosis not present

## 2016-06-26 DIAGNOSIS — N183 Chronic kidney disease, stage 3 (moderate): Secondary | ICD-10-CM | POA: Diagnosis not present

## 2016-06-26 DIAGNOSIS — Z7982 Long term (current) use of aspirin: Secondary | ICD-10-CM | POA: Insufficient documentation

## 2016-06-26 DIAGNOSIS — I5022 Chronic systolic (congestive) heart failure: Secondary | ICD-10-CM | POA: Insufficient documentation

## 2016-06-26 DIAGNOSIS — I130 Hypertensive heart and chronic kidney disease with heart failure and stage 1 through stage 4 chronic kidney disease, or unspecified chronic kidney disease: Secondary | ICD-10-CM | POA: Insufficient documentation

## 2016-06-26 DIAGNOSIS — R079 Chest pain, unspecified: Secondary | ICD-10-CM | POA: Diagnosis not present

## 2016-06-26 DIAGNOSIS — N281 Cyst of kidney, acquired: Secondary | ICD-10-CM | POA: Diagnosis not present

## 2016-06-26 DIAGNOSIS — R109 Unspecified abdominal pain: Secondary | ICD-10-CM | POA: Diagnosis not present

## 2016-06-26 DIAGNOSIS — R0602 Shortness of breath: Secondary | ICD-10-CM | POA: Diagnosis not present

## 2016-06-26 DIAGNOSIS — Z8673 Personal history of transient ischemic attack (TIA), and cerebral infarction without residual deficits: Secondary | ICD-10-CM | POA: Insufficient documentation

## 2016-06-26 DIAGNOSIS — R600 Localized edema: Secondary | ICD-10-CM | POA: Diagnosis not present

## 2016-06-26 DIAGNOSIS — Z950 Presence of cardiac pacemaker: Secondary | ICD-10-CM | POA: Insufficient documentation

## 2016-06-26 LAB — CBC WITH DIFFERENTIAL/PLATELET
BASOS ABS: 0 10*3/uL (ref 0.0–0.1)
Basophils Relative: 1 %
EOS PCT: 1 %
Eosinophils Absolute: 0.1 10*3/uL (ref 0.0–0.7)
HEMATOCRIT: 29.7 % — AB (ref 36.0–46.0)
Hemoglobin: 9.8 g/dL — ABNORMAL LOW (ref 12.0–15.0)
LYMPHS ABS: 1.1 10*3/uL (ref 0.7–4.0)
LYMPHS PCT: 31 %
MCH: 30.3 pg (ref 26.0–34.0)
MCHC: 33 g/dL (ref 30.0–36.0)
MCV: 92 fL (ref 78.0–100.0)
MONO ABS: 0.4 10*3/uL (ref 0.1–1.0)
MONOS PCT: 11 %
NEUTROS ABS: 2.1 10*3/uL (ref 1.7–7.7)
Neutrophils Relative %: 56 %
PLATELETS: 129 10*3/uL — AB (ref 150–400)
RBC: 3.23 MIL/uL — ABNORMAL LOW (ref 3.87–5.11)
RDW: 14.8 % (ref 11.5–15.5)
WBC: 3.7 10*3/uL — ABNORMAL LOW (ref 4.0–10.5)

## 2016-06-26 LAB — URINALYSIS, ROUTINE W REFLEX MICROSCOPIC
BILIRUBIN URINE: NEGATIVE
GLUCOSE, UA: NEGATIVE mg/dL
KETONES UR: NEGATIVE mg/dL
NITRITE: NEGATIVE
PH: 5 (ref 5.0–8.0)
Protein, ur: NEGATIVE mg/dL
SPECIFIC GRAVITY, URINE: 1.008 (ref 1.005–1.030)

## 2016-06-26 LAB — BASIC METABOLIC PANEL
Anion gap: 8 (ref 5–15)
BUN: 20 mg/dL (ref 6–20)
CALCIUM: 9.3 mg/dL (ref 8.9–10.3)
CO2: 28 mmol/L (ref 22–32)
Chloride: 102 mmol/L (ref 101–111)
Creatinine, Ser: 1.43 mg/dL — ABNORMAL HIGH (ref 0.44–1.00)
GFR calc Af Amer: 37 mL/min — ABNORMAL LOW (ref >60)
GFR, EST NON AFRICAN AMERICAN: 32 mL/min — AB (ref >60)
Glucose, Bld: 112 mg/dL — ABNORMAL HIGH (ref 65–99)
Potassium: 4.1 mmol/L (ref 3.5–5.1)
Sodium: 138 mmol/L (ref 135–145)

## 2016-06-26 LAB — I-STAT TROPONIN, ED: Troponin i, poc: 0 ng/mL (ref 0.00–0.08)

## 2016-06-26 LAB — HEPATIC FUNCTION PANEL
ALK PHOS: 121 U/L (ref 38–126)
ALT: 10 U/L — ABNORMAL LOW (ref 14–54)
AST: 29 U/L (ref 15–41)
Albumin: 3.8 g/dL (ref 3.5–5.0)
BILIRUBIN DIRECT: 0.8 mg/dL — AB (ref 0.1–0.5)
BILIRUBIN TOTAL: 2.2 mg/dL — AB (ref 0.3–1.2)
Indirect Bilirubin: 1.4 mg/dL — ABNORMAL HIGH (ref 0.3–0.9)
Total Protein: 8.5 g/dL — ABNORMAL HIGH (ref 6.5–8.1)

## 2016-06-26 LAB — BRAIN NATRIURETIC PEPTIDE: B Natriuretic Peptide: 1304.1 pg/mL — ABNORMAL HIGH (ref 0.0–100.0)

## 2016-06-26 MED ORDER — CEPHALEXIN 250 MG PO CAPS: 250.0000 mg | ORAL_CAPSULE | Freq: Four times a day (QID) | ORAL | 0 refills | Status: AC

## 2016-06-26 MED ORDER — FUROSEMIDE 10 MG/ML IJ SOLN
40.0000 mg | Freq: Once | INTRAMUSCULAR | Status: AC
  Administered 2016-06-26: 40 mg via INTRAVENOUS
  Filled 2016-06-26: qty 4

## 2016-06-26 MED ORDER — ACETAMINOPHEN 500 MG PO TABS
1000.0000 mg | ORAL_TABLET | Freq: Once | ORAL | Status: AC
  Administered 2016-06-26: 1000 mg via ORAL
  Filled 2016-06-26: qty 2

## 2016-06-26 MED ORDER — DEXTROSE 5 % IV SOLN
1.0000 g | Freq: Once | INTRAVENOUS | Status: AC
  Administered 2016-06-26: 1 g via INTRAVENOUS
  Filled 2016-06-26: qty 10

## 2016-06-26 NOTE — Discharge Instructions (Addendum)
You had a CT scan on your abdomen that showed cysts on your kidneys.  These will need to be rechecked in the next 6 months with repeat imaging.    Your urine sample also showed urinary tract infection is likely - start Cephalexin 4 time daily for 7 days - take with food See your doctor in 2 days for recheck.

## 2016-06-26 NOTE — ED Triage Notes (Signed)
Patient complains of right sided back pain and neck pain with fatigue x 2 days, reports that she feels fatigued and tired with same. No recent trauma. Had fall over 1 month ago. Alert and oriented

## 2016-06-26 NOTE — ED Provider Notes (Signed)
The patient was reevaluated, she still appears well, I have answered all the patient and family members questions, she does have signs of urinary tract infection on urinalysis, Rocephin ordered, culture ordered, the patient will be discharged with cephalexin. The patient and family member are in agreement.   Noemi Chapel, MD 06/26/16 [phone]

## 2016-06-26 NOTE — ED Notes (Signed)
MD at bedside. 

## 2016-06-26 NOTE — ED Notes (Signed)
Patient transported to X-ray 

## 2016-06-26 NOTE — ED Provider Notes (Signed)
[facility] DEPT Provider Note   CSN: [id number] Arrival date & time: 06/26/16  1041     History   Chief Complaint Chief Complaint  Patient presents with  . multiple complaints    HPI [name] is a 83 y.o. female.  The history is provided by the patient. No language interpreter was used.    [name] is a 83 y.o. female who presents to the Emergency Department complaining of side pain, sob.  She reports right sided thoracic/mid back pain that has been present for the last week. Pain is sharp and constant in nature and nonradiating. She has associated shortness of breath that is intermittent in nature with dyspnea on exertion. She also reports bilateral lower extremity edema that has worsened over the last several days. She denies any fevers, vomiting, diarrhea, dysuria.  Past Medical History:  Diagnosis Date  . Adenomatous colon polyp    tubular  . Allergic rhinitis   . Anemia    Iron deficiency, does not tolerate po  . Arthritis    "all over my body"  . Atrial fibrillation (HCC)    on coumadin followed by Dr. Elie Confer  . CHF (congestive heart failure) (HCC)    diastolic dysfunction  . Constipation   . Dementia   . Diverticulosis of colon (without mention of hemorrhage)   . DJD (degenerative joint disease)   . GERD (gastroesophageal reflux disease)   . History of sick sinus syndrome s/p pacemaker Dr. Lovena Le 2000  . History of trichomonal urethritis   . History of vaginal bleeding 05/2005   nagative endometrial biopsy  . Hypertension    well controlled on 3 agents  . Incontinence overflow, stress female    on vesicare  . Onychomycosis   . Osteoporosis   . Pacemaker   . Restrictive lung disease 1996   PFT's showed mild disease  . Stroke [facility]) 2002   R MCA, cardioembolic  . Type II diabetes mellitus (HCC)    diet controlled  . Weight loss, unintentional 2010-2011   8/08-8/10 138-146 lbs, between 8/10-10/11 lost 18 lbs., work-up negative     Patient Active Problem List   Diagnosis Date Noted  . Community acquired pneumonia of left lower lobe of lung ([place]) 04/05/2016  . Chronic systolic heart failure ([place]) 03/16/2016  . AKI (acute kidney injury) ([place]) 03/16/2016  . Malaise and fatigue 03/16/2016  . Chronic atrial fibrillation ([place])   . Syncope 12/27/2015  . Hyperglycemia 10/07/2015  . Acute on chronic congestive heart failure ([place]) 12/14/2014  . Fecal incontinence due to anorectal disorder 11/06/2014  . Health care maintenance 02/27/2014  . Hypokalemia 01/23/2014  . Malnutrition of moderate degree ([place]) 01/22/2014  . Normocytic anemia 01/21/2014  . CKD (chronic kidney disease) stage 3, GFR 30-59 ml/min 01/21/2014  . Allergic rhinitis 10/16/2013  . Thrombocytopenia ([place]) 09/08/2013  . Unspecified protein-calorie malnutrition 01/23/2013  . Infection of urinary tract 01/22/2013  . Decreased hearing 08/23/2012  . At high risk for falls 08/10/2012  . Age-related cognitive decline 08/10/2012  . GERD (gastroesophageal reflux disease) 06/15/2011  . Pacemaker-[employer]tr[employer] 05/23/2011  . Chronic diastolic heart failure ([place])   . Hypertension   . Osteoporosis   . History of stroke   . Atrial fibrillation, controlled ([place])   . Incontinence overflow, stress female   . History of sick sinus syndrome   . B12 DEFICIENCY 11/05/2009  . Constipation 10/30/2009    Past Surgical History:  Procedure Laterality Date  . ABDOMINAL HYSTERECTOMY    .  CATARACT EXTRACTION    . CHOLECYSTECTOMY    . COLONOSCOPY    . ESOPHAGOGASTRODUODENOSCOPY    . INSERT / REPLACE / REMOVE PACEMAKER  2000   MDT Sigma pacemaker implanted by Dr Lovena Le;  Marland Kitchen PACEMAKER GENERATOR CHANGE  10/2012   AdaptaL 10/2012 by Dr Lovena Le  . PACEMAKER GENERATOR CHANGE N/A 11/02/2012   Procedure: PACEMAKER GENERATOR CHANGE;  Surgeon: Evans Lance, MD;  Location: [facility] CATH LAB;  Service: Cardiovascular;  Laterality: N/A;    OB History    No data available       Home  Medications    Prior to Admission medications   Medication Sig Start Date End Date Taking? Authorizing Provider  albuterol (PROVENTIL HFA;VENTOLIN HFA) 108 (90 Base) MCG/ACT inhaler Inhale 2 puffs into the lungs every 6 (six) hours as needed for wheezing or shortness of breath. Patient not taking: Reported on 06/16/2016 03/20/16   Maryellen Pile, MD  aspirin EC 81 MG tablet Take 81 mg by mouth daily.    Historical Provider, MD  dextromethorphan-guaiFENesin (MUCINEX DM) 30-600 MG 12hr tablet Take 1 tablet by mouth 2 (two) times daily. Patient not taking: Reported on 06/16/2016 03/20/16   Maryellen Pile, MD  diclofenac sodium (VOLTAREN) 1 % GEL Apply 2 g topically 4 (four) times daily. To left shoulder. Patient taking differently: Apply 2 g topically 4 (four) times daily as needed (pain). To left shoulder. 12/29/15   Alexa Angela Burke, MD  docusate sodium (COLACE) 100 MG capsule Take 1 capsule (100 mg total) by mouth daily as needed. 06/20/16 06/20/17  Norman Herrlich, MD  furosemide (LASIX) 20 MG tablet Take 3 tabs in the morning and 2 tabs in the evening. 06/20/16   Norman Herrlich, MD  lisinopril (PRINIVIL,ZESTRIL) 20 MG tablet TAKE 1 TABLET BY MOUTH DAILY 08/21/15   Sid Falcon, MD  loperamide (IMODIUM) 2 MG capsule Take 1 capsule (2 mg total) by mouth as needed for diarrhea or loose stools. 04/14/16   Juliet Rude, MD  metoprolol (LOPRESSOR) 100 MG tablet TAKE 1 TABLET(100 MG) BY MOUTH TWICE DAILY 05/06/16   Sid Falcon, MD  Nutritional Supplements (Peridot) LIQD Take 237 mLs by mouth 3 (three) times daily. 12/19/14   Zada Finders, MD  polyethylene glycol (MIRALAX / GLYCOLAX) packet Take 17 g by mouth daily as needed for mild constipation. 03/20/16   Maryellen Pile, MD  potassium chloride 20 MEQ/15ML (10%) SOLN TAKE 15 MLS BY MOUTH DAILY 11/03/15   Sid Falcon, MD    Family History Family History  Problem Relation Age of Onset  . Diabetes Sister   . Stroke Sister   . Heart disease      . Heart disease Father     Social History Social History  Substance Use Topics  . Smoking status: Never Smoker  . Smokeless tobacco: Never Used  . Alcohol use 0.0 oz/week     Comment: occasionally.     Allergies   Patient has no known allergies.   Review of Systems Review of Systems  All other systems reviewed and are negative.    Physical Exam Updated Vital Signs BP 136/83 (BP Location: Right Arm)   Pulse 84   Temp 98.7 F (37.1 C) (Oral)   Resp 24   SpO2 100%   Physical Exam  Constitutional: She is oriented to person, place, and time. She appears well-developed and well-nourished.  HENT:  Head: Normocephalic and atraumatic.  Neck: JVD present.  Cardiovascular:  Normal rate and regular rhythm.   No murmur heard. Pulmonary/Chest: Effort normal. No respiratory distress.  Decreased air movement bilaterally with fine crackles in bilateral bases  Abdominal: Soft. There is no tenderness. There is no rebound and no guarding.  Musculoskeletal: She exhibits no tenderness.  1-2+ pitting edema to BLE  Neurological: She is alert and oriented to person, place, and time.  Skin: Skin is warm and dry.  Psychiatric: She has a normal mood and affect. Her behavior is normal.  Nursing note and vitals reviewed.    ED Treatments / Results  Labs (all labs ordered are listed, but only abnormal results are displayed) Labs Reviewed  CBC WITH DIFFERENTIAL/PLATELET - Abnormal; Notable for the following:       Result Value   WBC 3.7 (*)    RBC 3.23 (*)    Hemoglobin 9.8 (*)    HCT 29.7 (*)    Platelets 129 (*)    All other components within normal limits  BASIC METABOLIC PANEL - Abnormal; Notable for the following:    Glucose, Bld 112 (*)    Creatinine, Ser 1.43 (*)    GFR calc non Af Amer 32 (*)    GFR calc Af Amer 37 (*)    All other components within normal limits  BRAIN NATRIURETIC PEPTIDE - Abnormal; Notable for the following:    B Natriuretic Peptide 1,304.1 (*)     All other components within normal limits  HEPATIC FUNCTION PANEL - Abnormal; Notable for the following:    Total Protein 8.5 (*)    ALT 10 (*)    Total Bilirubin 2.2 (*)    Bilirubin, Direct 0.8 (*)    Indirect Bilirubin 1.4 (*)    All other components within normal limits  URINALYSIS, ROUTINE W REFLEX MICROSCOPIC  I-STAT TROPOININ, ED    EKG  EKG Interpretation  Date/Time:  Sunday June 26 2016 12:31:27 EDT Ventricular Rate:  88 PR Interval:    QRS Duration: 101 QT Interval:  378 QTC Calculation: 458 R Axis:   -43 Text Interpretation:  Atrial fibrillation Left axis deviation Low voltage, extremity leads Repol abnrm suggests ischemia, anterolateral Confirmed by Hazle Coca [phone]) on 06/26/2016 12:41:00 PM       Radiology Dg Chest 2 View  Result Date: 06/26/2016 CLINICAL DATA:  Right-sided chest pain and shortness of Breath EXAM: CHEST  2 VIEW COMPARISON:  03/18/2016 FINDINGS: Cardiac shadow remains enlarged. Pacing device is again seen. The lungs are well aerated bilaterally with mild interstitial changes stable from the prior study. No sizable infiltrate or effusion is seen. No bony abnormality is noted. IMPRESSION: No acute abnormality seen. Electronically Signed   By: Inez Catalina M.D.   On: 06/26/2016 13:17    Procedures Procedures (including critical care time)  Medications Ordered in ED Medications - No data to display   Initial Impression / Assessment and Plan / ED Course  I have reviewed the triage vital signs and the nursing notes.  Pertinent labs & imaging results that were available during my care of the patient were reviewed by me and considered in my medical decision making (see chart for details).     Patient with history of CK D, CHF here with shortness of breath, fatigue, right side pain. She is chronically ill appearing on examination with no acute distress. BMP demonstrates stable renal insufficiency. BNP is at her baseline with no evidence of acute CHF  exacerbation.  Checking CT to rule out kidney stone given her pain.  Patient care transferred and a urinalysis.  Final Clinical Impressions(s) / ED Diagnoses   Final diagnoses:  None    New Prescriptions New Prescriptions   No medications on file     Quintella Reichert, MD 06/26/16 1646

## 2016-06-27 LAB — URINE CULTURE

## 2016-07-01 ENCOUNTER — Telehealth: Payer: Self-pay | Admitting: Internal Medicine

## 2016-07-01 NOTE — Telephone Encounter (Signed)
APT. REMINDER CALL, LMTCB °

## 2016-07-04 ENCOUNTER — Encounter: Payer: Self-pay | Admitting: Internal Medicine

## 2016-07-04 ENCOUNTER — Ambulatory Visit (INDEPENDENT_AMBULATORY_CARE_PROVIDER_SITE_OTHER): Payer: Medicare HMO | Admitting: Internal Medicine

## 2016-07-04 ENCOUNTER — Other Ambulatory Visit: Payer: Self-pay | Admitting: Internal Medicine

## 2016-07-04 VITALS — BP 122/49 | HR 71 | Temp 98.0°F | Ht 60.0 in | Wt 132.5 lb

## 2016-07-04 DIAGNOSIS — Z5189 Encounter for other specified aftercare: Secondary | ICD-10-CM

## 2016-07-04 DIAGNOSIS — R296 Repeated falls: Secondary | ICD-10-CM | POA: Diagnosis not present

## 2016-07-04 DIAGNOSIS — N390 Urinary tract infection, site not specified: Secondary | ICD-10-CM | POA: Diagnosis not present

## 2016-07-04 DIAGNOSIS — I5032 Chronic diastolic (congestive) heart failure: Secondary | ICD-10-CM

## 2016-07-04 DIAGNOSIS — Z9114 Patient's other noncompliance with medication regimen: Secondary | ICD-10-CM

## 2016-07-04 DIAGNOSIS — J90 Pleural effusion, not elsewhere classified: Secondary | ICD-10-CM

## 2016-07-04 DIAGNOSIS — Z79899 Other long term (current) drug therapy: Secondary | ICD-10-CM

## 2016-07-04 DIAGNOSIS — N281 Cyst of kidney, acquired: Secondary | ICD-10-CM | POA: Diagnosis not present

## 2016-07-04 DIAGNOSIS — B9689 Other specified bacterial agents as the cause of diseases classified elsewhere: Secondary | ICD-10-CM | POA: Diagnosis not present

## 2016-07-04 DIAGNOSIS — Z9181 History of falling: Secondary | ICD-10-CM

## 2016-07-04 DIAGNOSIS — N3000 Acute cystitis without hematuria: Secondary | ICD-10-CM

## 2016-07-04 MED ORDER — SULFAMETHOXAZOLE-TRIMETHOPRIM 400-80 MG PO TABS: 1.0000 | ORAL_TABLET | Freq: Two times a day (BID) | ORAL | 0 refills | Status: DC

## 2016-07-04 MED ORDER — FUROSEMIDE 20 MG PO TABS: ORAL_TABLET | ORAL | 0 refills | Status: DC

## 2016-07-04 NOTE — Patient Instructions (Signed)
For your fluid, take 4 pills of lasix in the morning and 2 in the evening.   For your burning with urination, stop taking the antibiotic you got at the emergency room. Start taking Bactrim, one pill twice a day for 3 days.   Keep weighing yourself daily. We will see you at the end of the week.

## 2016-07-04 NOTE — Progress Notes (Signed)
CC: fluid overload  HPI:  [name] is a 83 y.o. with a PMH of CHF, CKD, h/o CVA presenting to clinic for follow up of her CHF.   Patient was treated for acute on chronic CHF exacerbation due to noncompliance on 2/27 with lasix 160mg  every morning as a change from her lasix 80mg  qAM and 40mg  qPM dosing. [facility] RN on subsequent visit had reported patient having symptomatic hypotension with this change in dosing so she was changed at f/u clinic visit to 60mg  in the morning and 40mg  in the evening. Patient states she has been compliant with this; she endorses increased LE swelling and discomfort; she states her daughter weighs her every day - daughter reports weight has been trending up and was 135 day prior to visit. She endorses chronic shortness of breath and orthopnea which are unchanged from prior; she denies cough or chest pain. [facility] nursing notes report scattered pills on counter and full medicine box. Patient still endorses dizziness on standing which she states has been chronic; she denies falls or syncope. Today her weight in clinic is 132lbs (dry weight 125lbs).  UTI:  Patient was seen in ED for right flank pain and diagnosed with UTI; she was started on Keflex four times daily. CT renal study was obtained which showed bilateral cysts, but no pyelonephritis or renal stones. Daughter states that she will at most take 2 doses a day. Patient still endorses intermittent dysuria but no increased frequency or hematuria. She denies vaginal discharge or discomfort. She states that her right flank pain (which she point to being RLQ/side) is resolved but now has similar pain on the left. She is unable to qualify it more but daughter states that it is intermittent, sharp in nature and occurs when patient is changing position lasting only a few seconds as far as the daughter could tell.   Patient and daughter are interested in HHPT to increase patient's endurance and strength as she is mainly  sedentary.   Please see problem based Assessment and Plan for status of patients chronic conditions.  Past Medical History:  Diagnosis Date  . Adenomatous colon polyp    tubular  . Allergic rhinitis   . Anemia    Iron deficiency, does not tolerate po  . Arthritis    "all over my body"  . Atrial fibrillation (HCC)    on coumadin followed by Dr. Elie Confer  . CHF (congestive heart failure) (HCC)    diastolic dysfunction  . Constipation   . Dementia   . Diverticulosis of colon (without mention of hemorrhage)   . DJD (degenerative joint disease)   . GERD (gastroesophageal reflux disease)   . History of sick sinus syndrome s/p pacemaker Dr. Lovena Le 2000  . History of trichomonal urethritis   . History of vaginal bleeding 05/2005   nagative endometrial biopsy  . Hypertension    well controlled on 3 agents  . Incontinence overflow, stress female    on vesicare  . Onychomycosis   . Osteoporosis   . Pacemaker   . Restrictive lung disease 1996   PFT's showed mild disease  . Stroke [facility]) 2002   R MCA, cardioembolic  . Type II diabetes mellitus (HCC)    diet controlled  . Weight loss, unintentional 2010-2011   8/08-8/10 138-146 lbs, between 8/10-10/11 lost 18 lbs., work-up negative    Review of Systems:   Review of Systems  Constitutional: Negative for chills, fever and malaise/fatigue.  Respiratory: Positive for shortness of  breath (chronic with exertion, unchanged). Negative for cough.   Cardiovascular: Positive for orthopnea (chronic, sleeps on 2 pillows on her side - unchanged) and leg swelling. Negative for chest pain and palpitations.  Gastrointestinal: Positive for abdominal pain (left side). Negative for blood in stool, constipation, diarrhea, melena, nausea and vomiting.  Genitourinary: Positive for dysuria. Negative for flank pain, frequency, hematuria and urgency.  Musculoskeletal: Negative for falls.       Bilateral leg discomfort  Neurological: Positive for dizziness and  weakness. Negative for tingling, sensory change, focal weakness, loss of consciousness and headaches.  Psychiatric/Behavioral: Positive for memory loss (chronic, unchanged).    Physical Exam:  Vitals:   07/04/16 1056  BP: (!) 122/49  Pulse: 71  Temp: 98 F (36.7 C)  TempSrc: Oral  SpO2: 100%  Weight: 132 lb 8 oz (60.1 kg)  Height: 5' (1.524 m)   Physical Exam  Constitutional: She is oriented to person, place, and time. She appears well-developed and well-nourished. No distress.  HENT:  Head: Normocephalic and atraumatic.  Eyes: Conjunctivae and EOM are normal. No scleral icterus.  Neck: Normal range of motion. Neck supple.  Cardiovascular: Normal rate, regular rhythm, normal heart sounds and intact distal pulses.  Exam reveals no gallop and no friction rub.   No murmur heard. Pulmonary/Chest: Effort normal. She has no wheezes. She has no rales.  Decreased breath sounds on right, no rales or wheezing appreciated  Abdominal: Soft. Bowel sounds are normal. She exhibits no distension and no mass. There is no tenderness. There is no rebound and no guarding. No hernia.  Musculoskeletal: Normal range of motion. She exhibits edema (2+ bil LE edema to knee) and tenderness.  Neurological: She is alert and oriented to person, place, and time.  Skin: Skin is warm and dry. Capillary refill takes less than 2 seconds. She is not diaphoretic.  Psychiatric: She has a normal mood and affect. Her behavior is normal.    Assessment & Plan:   See Encounters Tab for problem based charting.   Patient discussed with Dr. Carmel [place], MD Internal Medicine PGY1

## 2016-07-05 LAB — BMP8+ANION GAP
Anion Gap: 22 mmol/L — ABNORMAL HIGH (ref 10.0–18.0)
BUN / CREAT RATIO: 17 (ref 12–28)
BUN: 24 mg/dL (ref 8–27)
CHLORIDE: 94 mmol/L — AB (ref 96–106)
CO2: 22 mmol/L (ref 18–29)
Calcium: 9.1 mg/dL (ref 8.7–10.3)
Creatinine, Ser: 1.41 mg/dL — ABNORMAL HIGH (ref 0.57–1.00)
GFR calc non Af Amer: 33 mL/min/{1.73_m2} — ABNORMAL LOW (ref >59)
GFR, EST AFRICAN AMERICAN: 38 mL/min/{1.73_m2} — AB (ref >59)
GLUCOSE: 120 mg/dL — AB (ref 65–99)
Potassium: 4.5 mmol/L (ref 3.5–5.2)
SODIUM: 138 mmol/L (ref 134–144)

## 2016-07-06 NOTE — Assessment & Plan Note (Signed)
Patient was treated for acute on chronic CHF exacerbation due to noncompliance on 2/27 with lasix 160mg  every morning as a change from her lasix 80mg  qAM and 40mg  qPM dosing. [facility] RN on subsequent visit had reported patient having symptomatic hypotension with this change in dosing so she was changed at f/u clinic visit to 60mg  in the morning and 40mg  in the evening. Patient states she has been compliant with this; she endorses increased LE swelling and discomfort; she states her daughter weighs her every day - daughter reports weight has been trending up and was 135 day prior to visit. She endorses chronic shortness of breath and orthopnea which are unchanged from prior; she denies cough or chest pain. [facility] nursing notes report scattered pills on counter and full medicine box. Patient still endorses dizziness on standing which she states has been chronic; she denies falls or syncope. Today her weight in clinic is 132lbs (dry weight 125lbs).  During ED visit she had CT renal study which showed R pleural effusion.  Exam reveals JVD, decreased breath sounds on RLL but no rales or wheezing, and 2+ pitting edema in bil LE which correlates with her fluid overload.   Orthostatic vital signs were obtained and negative.  Plan: --increase lasix to 80mg  in the morning and 40mg  in the evening --continue potassium chloride 61mEq daily --obtain BMet - K 4.5, Cr 1.4 (stable from 10 days prior) --f/u in 3-4 days

## 2016-07-06 NOTE — Assessment & Plan Note (Signed)
Patient with multiple past falls and dizziness on standing. She and daughter deny recent falls or syncopal episodes. They are interested in HHPT to increase patient's strength, endurance and enforce skills to decrease risk of future falls.   Plan: --HHPT ordered

## 2016-07-06 NOTE — Assessment & Plan Note (Signed)
Patient was seen in ED for right flank pain and diagnosed with UTI; she was started on Keflex four times daily. CT renal study was obtained which showed bilateral cysts, but no pyelonephritis or renal stones. Daughter states that she will at most take 2 doses a day. Patient still endorses intermittent dysuria but no increased frequency or hematuria. She denies vaginal discharge or discomfort. She states that her right flank pain (which she point to being RLQ/side) is resolved but now has similar pain on the left. She is unable to qualify it more but daughter states that it is intermittent, sharp in nature and occurs when patient is changing position lasting only a few seconds as far as the daughter could tell.  Exam is negative for reproducible abdominal pain and negative for CVA tenderness.  Plan: --stop keflex --start bactrim renally dosed BID x3 days

## 2016-07-07 ENCOUNTER — Telehealth: Payer: Self-pay | Admitting: Internal Medicine

## 2016-07-07 DIAGNOSIS — M810 Age-related osteoporosis without current pathological fracture: Secondary | ICD-10-CM | POA: Diagnosis not present

## 2016-07-07 DIAGNOSIS — I130 Hypertensive heart and chronic kidney disease with heart failure and stage 1 through stage 4 chronic kidney disease, or unspecified chronic kidney disease: Secondary | ICD-10-CM | POA: Diagnosis not present

## 2016-07-07 DIAGNOSIS — M6281 Muscle weakness (generalized): Secondary | ICD-10-CM | POA: Diagnosis not present

## 2016-07-07 DIAGNOSIS — N183 Chronic kidney disease, stage 3 (moderate): Secondary | ICD-10-CM | POA: Diagnosis not present

## 2016-07-07 DIAGNOSIS — I4891 Unspecified atrial fibrillation: Secondary | ICD-10-CM | POA: Diagnosis not present

## 2016-07-07 DIAGNOSIS — E440 Moderate protein-calorie malnutrition: Secondary | ICD-10-CM | POA: Diagnosis not present

## 2016-07-07 DIAGNOSIS — F0390 Unspecified dementia without behavioral disturbance: Secondary | ICD-10-CM | POA: Diagnosis not present

## 2016-07-07 DIAGNOSIS — I5042 Chronic combined systolic (congestive) and diastolic (congestive) heart failure: Secondary | ICD-10-CM | POA: Diagnosis not present

## 2016-07-07 DIAGNOSIS — M1990 Unspecified osteoarthritis, unspecified site: Secondary | ICD-10-CM | POA: Diagnosis not present

## 2016-07-07 NOTE — Telephone Encounter (Signed)
Agree 

## 2016-07-07 NOTE — Telephone Encounter (Signed)
Need PT order

## 2016-07-07 NOTE — Telephone Encounter (Signed)
VO for PT 2x week x 4 weeks, balance, gait, strength, do you agree?

## 2016-07-08 ENCOUNTER — Ambulatory Visit: Payer: Medicare HMO

## 2016-07-08 ENCOUNTER — Encounter: Payer: Self-pay | Admitting: Internal Medicine

## 2016-07-08 NOTE — Progress Notes (Signed)
Internal Medicine Clinic Attending  Case discussed with Dr. Svalina  at the time of the visit.  We reviewed the resident's history and exam and pertinent patient test results.  I agree with the assessment, diagnosis, and plan of care documented in the resident's note.  

## 2016-07-12 DIAGNOSIS — I130 Hypertensive heart and chronic kidney disease with heart failure and stage 1 through stage 4 chronic kidney disease, or unspecified chronic kidney disease: Secondary | ICD-10-CM | POA: Diagnosis not present

## 2016-07-12 DIAGNOSIS — M1990 Unspecified osteoarthritis, unspecified site: Secondary | ICD-10-CM | POA: Diagnosis not present

## 2016-07-12 DIAGNOSIS — M810 Age-related osteoporosis without current pathological fracture: Secondary | ICD-10-CM | POA: Diagnosis not present

## 2016-07-12 DIAGNOSIS — E440 Moderate protein-calorie malnutrition: Secondary | ICD-10-CM | POA: Diagnosis not present

## 2016-07-12 DIAGNOSIS — N183 Chronic kidney disease, stage 3 (moderate): Secondary | ICD-10-CM | POA: Diagnosis not present

## 2016-07-12 DIAGNOSIS — I4891 Unspecified atrial fibrillation: Secondary | ICD-10-CM | POA: Diagnosis not present

## 2016-07-12 DIAGNOSIS — F0390 Unspecified dementia without behavioral disturbance: Secondary | ICD-10-CM | POA: Diagnosis not present

## 2016-07-12 DIAGNOSIS — I5042 Chronic combined systolic (congestive) and diastolic (congestive) heart failure: Secondary | ICD-10-CM | POA: Diagnosis not present

## 2016-07-12 DIAGNOSIS — M6281 Muscle weakness (generalized): Secondary | ICD-10-CM | POA: Diagnosis not present

## 2016-07-14 DIAGNOSIS — M1990 Unspecified osteoarthritis, unspecified site: Secondary | ICD-10-CM | POA: Diagnosis not present

## 2016-07-14 DIAGNOSIS — I130 Hypertensive heart and chronic kidney disease with heart failure and stage 1 through stage 4 chronic kidney disease, or unspecified chronic kidney disease: Secondary | ICD-10-CM | POA: Diagnosis not present

## 2016-07-14 DIAGNOSIS — M810 Age-related osteoporosis without current pathological fracture: Secondary | ICD-10-CM | POA: Diagnosis not present

## 2016-07-14 DIAGNOSIS — F0390 Unspecified dementia without behavioral disturbance: Secondary | ICD-10-CM | POA: Diagnosis not present

## 2016-07-14 DIAGNOSIS — I4891 Unspecified atrial fibrillation: Secondary | ICD-10-CM | POA: Diagnosis not present

## 2016-07-14 DIAGNOSIS — N183 Chronic kidney disease, stage 3 (moderate): Secondary | ICD-10-CM | POA: Diagnosis not present

## 2016-07-14 DIAGNOSIS — E440 Moderate protein-calorie malnutrition: Secondary | ICD-10-CM | POA: Diagnosis not present

## 2016-07-14 DIAGNOSIS — M6281 Muscle weakness (generalized): Secondary | ICD-10-CM | POA: Diagnosis not present

## 2016-07-14 DIAGNOSIS — I5042 Chronic combined systolic (congestive) and diastolic (congestive) heart failure: Secondary | ICD-10-CM | POA: Diagnosis not present

## 2016-07-21 ENCOUNTER — Ambulatory Visit: Payer: Self-pay | Admitting: *Deleted

## 2016-07-21 ENCOUNTER — Emergency Department (HOSPITAL_COMMUNITY): Payer: Medicare HMO

## 2016-07-21 ENCOUNTER — Observation Stay (HOSPITAL_COMMUNITY)
Admission: EM | Admit: 2016-07-21 | Discharge: 2016-07-23 | Disposition: A | Discharge status: Home / Self Care | Payer: Medicare HMO | Attending: Oncology | Admitting: Oncology

## 2016-07-21 ENCOUNTER — Other Ambulatory Visit: Payer: Self-pay | Admitting: *Deleted

## 2016-07-21 ENCOUNTER — Encounter (HOSPITAL_COMMUNITY): Payer: Self-pay | Admitting: *Deleted

## 2016-07-21 DIAGNOSIS — I5033 Acute on chronic diastolic (congestive) heart failure: Secondary | ICD-10-CM | POA: Diagnosis not present

## 2016-07-21 DIAGNOSIS — M810 Age-related osteoporosis without current pathological fracture: Secondary | ICD-10-CM | POA: Diagnosis not present

## 2016-07-21 DIAGNOSIS — K219 Gastro-esophageal reflux disease without esophagitis: Secondary | ICD-10-CM | POA: Diagnosis not present

## 2016-07-21 DIAGNOSIS — Z79899 Other long term (current) drug therapy: Secondary | ICD-10-CM | POA: Diagnosis not present

## 2016-07-21 DIAGNOSIS — D631 Anemia in chronic kidney disease: Secondary | ICD-10-CM | POA: Insufficient documentation

## 2016-07-21 DIAGNOSIS — Z7982 Long term (current) use of aspirin: Secondary | ICD-10-CM | POA: Insufficient documentation

## 2016-07-21 DIAGNOSIS — E1122 Type 2 diabetes mellitus with diabetic chronic kidney disease: Secondary | ICD-10-CM | POA: Diagnosis not present

## 2016-07-21 DIAGNOSIS — I270 Primary pulmonary hypertension: Secondary | ICD-10-CM

## 2016-07-21 DIAGNOSIS — I5043 Acute on chronic combined systolic (congestive) and diastolic (congestive) heart failure: Principal | ICD-10-CM | POA: Insufficient documentation

## 2016-07-21 DIAGNOSIS — K5900 Constipation, unspecified: Secondary | ICD-10-CM | POA: Diagnosis not present

## 2016-07-21 DIAGNOSIS — I872 Venous insufficiency (chronic) (peripheral): Secondary | ICD-10-CM | POA: Diagnosis not present

## 2016-07-21 DIAGNOSIS — I313 Pericardial effusion (noninflammatory): Secondary | ICD-10-CM | POA: Insufficient documentation

## 2016-07-21 DIAGNOSIS — Z791 Long term (current) use of non-steroidal anti-inflammatories (NSAID): Secondary | ICD-10-CM | POA: Diagnosis not present

## 2016-07-21 DIAGNOSIS — I10 Essential (primary) hypertension: Secondary | ICD-10-CM | POA: Diagnosis not present

## 2016-07-21 DIAGNOSIS — I130 Hypertensive heart and chronic kidney disease with heart failure and stage 1 through stage 4 chronic kidney disease, or unspecified chronic kidney disease: Secondary | ICD-10-CM | POA: Diagnosis not present

## 2016-07-21 DIAGNOSIS — R0602 Shortness of breath: Secondary | ICD-10-CM

## 2016-07-21 DIAGNOSIS — I5042 Chronic combined systolic (congestive) and diastolic (congestive) heart failure: Secondary | ICD-10-CM | POA: Diagnosis not present

## 2016-07-21 DIAGNOSIS — I5032 Chronic diastolic (congestive) heart failure: Secondary | ICD-10-CM

## 2016-07-21 DIAGNOSIS — I482 Chronic atrial fibrillation, unspecified: Secondary | ICD-10-CM | POA: Diagnosis present

## 2016-07-21 DIAGNOSIS — F0390 Unspecified dementia without behavioral disturbance: Secondary | ICD-10-CM | POA: Insufficient documentation

## 2016-07-21 DIAGNOSIS — I5022 Chronic systolic (congestive) heart failure: Secondary | ICD-10-CM | POA: Diagnosis present

## 2016-07-21 DIAGNOSIS — I509 Heart failure, unspecified: Secondary | ICD-10-CM

## 2016-07-21 DIAGNOSIS — I2721 Secondary pulmonary arterial hypertension: Secondary | ICD-10-CM | POA: Insufficient documentation

## 2016-07-21 DIAGNOSIS — Z8673 Personal history of transient ischemic attack (TIA), and cerebral infarction without residual deficits: Secondary | ICD-10-CM | POA: Diagnosis not present

## 2016-07-21 DIAGNOSIS — M6281 Muscle weakness (generalized): Secondary | ICD-10-CM | POA: Diagnosis not present

## 2016-07-21 DIAGNOSIS — M1990 Unspecified osteoarthritis, unspecified site: Secondary | ICD-10-CM | POA: Diagnosis not present

## 2016-07-21 DIAGNOSIS — N183 Chronic kidney disease, stage 3 (moderate): Secondary | ICD-10-CM | POA: Insufficient documentation

## 2016-07-21 DIAGNOSIS — Z950 Presence of cardiac pacemaker: Secondary | ICD-10-CM | POA: Insufficient documentation

## 2016-07-21 DIAGNOSIS — E1151 Type 2 diabetes mellitus with diabetic peripheral angiopathy without gangrene: Secondary | ICD-10-CM | POA: Diagnosis not present

## 2016-07-21 DIAGNOSIS — E440 Moderate protein-calorie malnutrition: Secondary | ICD-10-CM | POA: Insufficient documentation

## 2016-07-21 DIAGNOSIS — I4891 Unspecified atrial fibrillation: Secondary | ICD-10-CM | POA: Diagnosis not present

## 2016-07-21 DIAGNOSIS — Z8679 Personal history of other diseases of the circulatory system: Secondary | ICD-10-CM

## 2016-07-21 LAB — BASIC METABOLIC PANEL
Anion gap: 9 (ref 5–15)
BUN: 24 mg/dL — ABNORMAL HIGH (ref 6–20)
CO2: 24 mmol/L (ref 22–32)
CREATININE: 1.57 mg/dL — AB (ref 0.44–1.00)
Calcium: 9.5 mg/dL (ref 8.9–10.3)
Chloride: 105 mmol/L (ref 101–111)
GFR calc Af Amer: 33 mL/min — ABNORMAL LOW (ref >60)
GFR calc non Af Amer: 28 mL/min — ABNORMAL LOW (ref >60)
Glucose, Bld: 99 mg/dL (ref 65–99)
Potassium: 4.1 mmol/L (ref 3.5–5.1)
Sodium: 138 mmol/L (ref 135–145)

## 2016-07-21 LAB — CBC
HCT: 29.6 % — ABNORMAL LOW (ref 36.0–46.0)
Hemoglobin: 9.9 g/dL — ABNORMAL LOW (ref 12.0–15.0)
MCH: 30.2 pg (ref 26.0–34.0)
MCHC: 33.4 g/dL (ref 30.0–36.0)
MCV: 90.2 fL (ref 78.0–100.0)
Platelets: 115 10*3/uL — ABNORMAL LOW (ref 150–400)
RBC: 3.28 MIL/uL — AB (ref 3.87–5.11)
RDW: 14.8 % (ref 11.5–15.5)
WBC: 4 10*3/uL (ref 4.0–10.5)

## 2016-07-21 LAB — I-STAT TROPONIN, ED: Troponin i, poc: 0.01 ng/mL (ref 0.00–0.08)

## 2016-07-21 LAB — BRAIN NATRIURETIC PEPTIDE: B Natriuretic Peptide: 1986.6 pg/mL — ABNORMAL HIGH (ref 0.0–100.0)

## 2016-07-21 MED ORDER — ASPIRIN EC 81 MG PO TBEC
81.0000 mg | DELAYED_RELEASE_TABLET | Freq: Every day | ORAL | Status: DC
  Administered 2016-07-21 – 2016-07-23 (×3): 81 mg via ORAL
  Filled 2016-07-21 (×3): qty 1

## 2016-07-21 MED ORDER — ACETAMINOPHEN 650 MG RE SUPP
650.0000 mg | Freq: Four times a day (QID) | RECTAL | Status: DC | PRN
Start: 2016-07-21 — End: 2016-07-23

## 2016-07-21 MED ORDER — FUROSEMIDE 10 MG/ML IJ SOLN
40.0000 mg | Freq: Two times a day (BID) | INTRAMUSCULAR | Status: DC
  Administered 2016-07-21 – 2016-07-22 (×3): 40 mg via INTRAVENOUS
  Filled 2016-07-21 (×3): qty 4

## 2016-07-21 MED ORDER — LISINOPRIL 20 MG PO TABS
20.0000 mg | ORAL_TABLET | Freq: Every day | ORAL | Status: DC
  Administered 2016-07-22 – 2016-07-23 (×2): 20 mg via ORAL
  Filled 2016-07-21 (×2): qty 1

## 2016-07-21 MED ORDER — ACETAMINOPHEN 325 MG PO TABS: 650.0000 mg | ORAL_TABLET | Freq: Four times a day (QID) | ORAL | Status: DC | PRN

## 2016-07-21 MED ORDER — POTASSIUM CHLORIDE 20 MEQ/15ML (10%) PO SOLN
20.0000 meq | Freq: Every day | ORAL | Status: DC
  Administered 2016-07-21 – 2016-07-23 (×3): 20 meq via ORAL
  Filled 2016-07-21 (×3): qty 15

## 2016-07-21 MED ORDER — ENOXAPARIN SODIUM 30 MG/0.3ML ~~LOC~~ SOLN
30.0000 mg | SUBCUTANEOUS | Status: DC
  Administered 2016-07-22: 30 mg via SUBCUTANEOUS
  Filled 2016-07-21 (×2): qty 0.3

## 2016-07-21 MED ORDER — FUROSEMIDE 10 MG/ML IJ SOLN
80.0000 mg | Freq: Once | INTRAMUSCULAR | Status: AC
  Administered 2016-07-21: 80 mg via INTRAVENOUS
  Filled 2016-07-21: qty 8

## 2016-07-21 MED ORDER — LISINOPRIL 20 MG PO TABS: 20.0000 mg | ORAL_TABLET | Freq: Every day | ORAL | Status: DC

## 2016-07-21 MED ORDER — ALBUTEROL SULFATE (2.5 MG/3ML) 0.083% IN NEBU: 3.0000 mL | INHALATION_SOLUTION | Freq: Four times a day (QID) | RESPIRATORY_TRACT | Status: DC | PRN

## 2016-07-21 MED ORDER — ENSURE ENLIVE PO LIQD
237.0000 mL | Freq: Three times a day (TID) | ORAL | Status: DC
  Administered 2016-07-21 – 2016-07-23 (×5): 237 mL via ORAL

## 2016-07-21 MED ORDER — METOPROLOL TARTRATE 100 MG PO TABS
100.0000 mg | ORAL_TABLET | Freq: Two times a day (BID) | ORAL | Status: DC
  Administered 2016-07-21 – 2016-07-23 (×4): 100 mg via ORAL
  Filled 2016-07-21 (×4): qty 1

## 2016-07-21 NOTE — ED Notes (Signed)
Updated patient and family on what is happening next for admission

## 2016-07-21 NOTE — ED Triage Notes (Signed)
PT is here with shortness of breath, swelling legs and belly.  Pt has history of heart failure and has a pacemaker.

## 2016-07-21 NOTE — ED Provider Notes (Signed)
[facility] DEPT Provider Note   CSN: [id number] Arrival date & time: 07/21/16  1351     History   Chief Complaint Chief Complaint  Patient presents with  . Shortness of Breath    HPI [name] is a 83 y.o. female.  HPI  83 yo F w/ h/o CHF and EF of 40-45% here with 4 days of worsening LE swellng and DOE similar to previous episodes of CHF exacerbation. No chest pain, PND or orthopnea. No other complaints. No associated or modifying symptoms.   Past Medical History:  Diagnosis Date  . Adenomatous colon polyp    tubular  . Allergic rhinitis   . Anemia    Iron deficiency, does not tolerate po  . Arthritis    "all over my body"  . Atrial fibrillation (HCC)    on coumadin followed by Dr. Elie Confer  . CHF (congestive heart failure) (HCC)    diastolic dysfunction  . Constipation   . Dementia   . Diverticulosis of colon (without mention of hemorrhage)   . DJD (degenerative joint disease)   . GERD (gastroesophageal reflux disease)   . History of sick sinus syndrome s/p pacemaker Dr. Lovena Le 2000  . History of trichomonal urethritis   . History of vaginal bleeding 05/2005   nagative endometrial biopsy  . Hypertension    well controlled on 3 agents  . Incontinence overflow, stress female    on vesicare  . Onychomycosis   . Osteoporosis   . Pacemaker   . Restrictive lung disease 1996   PFT's showed mild disease  . Stroke [facility]) 2002   R MCA, cardioembolic  . Type II diabetes mellitus (HCC)    diet controlled  . Weight loss, unintentional 2010-2011   8/08-8/10 138-146 lbs, between 8/10-10/11 lost 18 lbs., work-up negative    Patient Active Problem List   Diagnosis Date Noted  . Chronic systolic heart failure ([place]) 03/16/2016  . AKI (acute kidney injury) ([place]) 03/16/2016  . Malaise and fatigue 03/16/2016  . Chronic atrial fibrillation ([place])   . Syncope 12/27/2015  . Hyperglycemia 10/07/2015  . Acute on chronic congestive heart failure ([place]) 12/14/2014  .  Fecal incontinence due to anorectal disorder 11/06/2014  . Health care maintenance 02/27/2014  . Hypokalemia 01/23/2014  . Malnutrition of moderate degree ([place]) 01/22/2014  . Normocytic anemia 01/21/2014  . CKD (chronic kidney disease) stage 3, GFR 30-59 ml/min 01/21/2014  . Allergic rhinitis 10/16/2013  . Thrombocytopenia ([place]) 09/08/2013  . Unspecified protein-calorie malnutrition 01/23/2013  . Infection of urinary tract 01/22/2013  . Decreased hearing 08/23/2012  . At high risk for falls 08/10/2012  . Age-related cognitive decline 08/10/2012  . GERD (gastroesophageal reflux disease) 06/15/2011  . Pacemaker-Medtronic 05/23/2011  . Chronic diastolic heart failure ([place])   . Hypertension   . Osteoporosis   . History of stroke   . Atrial fibrillation, controlled ([place])   . Incontinence overflow, stress female   . History of sick sinus syndrome   . B12 DEFICIENCY 11/05/2009  . Constipation 10/30/2009    Past Surgical History:  Procedure Laterality Date  . ABDOMINAL HYSTERECTOMY    . CATARACT EXTRACTION    . CHOLECYSTECTOMY    . COLONOSCOPY    . ESOPHAGOGASTRODUODENOSCOPY    . INSERT / REPLACE / REMOVE PACEMAKER  2000   MDT Sigma pacemaker implanted by Dr Lovena Le;  Marland Kitchen PACEMAKER GENERATOR CHANGE  10/2012   AdaptaL 10/2012 by Dr Lovena Le  . PACEMAKER GENERATOR CHANGE N/A 11/02/2012   Procedure:  PACEMAKER GENERATOR CHANGE;  Surgeon: Evans Lance, MD;  Location: [facility] CATH LAB;  Service: Cardiovascular;  Laterality: N/A;    OB History    No data available       Home Medications    Prior to Admission medications   Medication Sig Start Date End Date Taking? Authorizing Provider  albuterol (PROVENTIL HFA;VENTOLIN HFA) 108 (90 Base) MCG/ACT inhaler Inhale 2 puffs into the lungs every 6 (six) hours as needed for wheezing or shortness of breath. Patient not taking: Reported on 06/16/2016 03/20/16   Maryellen Pile, MD  aspirin EC 81 MG tablet Take 81 mg by mouth daily.    Historical  Provider, MD  dextromethorphan-guaiFENesin (MUCINEX DM) 30-600 MG 12hr tablet Take 1 tablet by mouth 2 (two) times daily. Patient not taking: Reported on 06/16/2016 03/20/16   Maryellen Pile, MD  diclofenac sodium (VOLTAREN) 1 % GEL Apply 2 g topically 4 (four) times daily. To left shoulder. Patient taking differently: Apply 2 g topically 4 (four) times daily as needed (pain). To left shoulder. 12/29/15   Alexa Angela Burke, MD  docusate sodium (COLACE) 100 MG capsule Take 1 capsule (100 mg total) by mouth daily as needed. 06/20/16 06/20/17  Norman Herrlich, MD  furosemide (LASIX) 20 MG tablet Take 4 tabs in the morning and 2 tabs in the evening. 07/04/16   Alphonzo Grieve, MD  lisinopril (PRINIVIL,ZESTRIL) 20 MG tablet TAKE 1 TABLET BY MOUTH DAILY 08/21/15   Sid Falcon, MD  loperamide (IMODIUM) 2 MG capsule Take 1 capsule (2 mg total) by mouth as needed for diarrhea or loose stools. Patient not taking: Reported on 06/26/2016 04/14/16   Sindy Guadeloupe Rivet, MD  metoprolol (LOPRESSOR) 100 MG tablet TAKE 1 TABLET(100 MG) BY MOUTH TWICE DAILY 05/06/16   Sid Falcon, MD  Nutritional Supplements ([place]) LIQD Take 237 mLs by mouth 3 (three) times daily. 12/19/14   Zada Finders, MD  polyethylene glycol (MIRALAX / GLYCOLAX) packet Take 17 g by mouth daily as needed for mild constipation. 03/20/16   Maryellen Pile, MD  potassium chloride 20 MEQ/15ML (10%) SOLN TAKE 15 MLS BY MOUTH DAILY 11/03/15   Sid Falcon, MD  sulfamethoxazole-trimethoprim (BACTRIM) 400-80 MG tablet Take 1 tablet by mouth 2 (two) times daily. 07/04/16   Alphonzo Grieve, MD    Family History Family History  Problem Relation Age of Onset  . Diabetes Sister   . Stroke Sister   . Heart disease    . Heart disease Father     Social History Social History  Substance Use Topics  . Smoking status: Never Smoker  . Smokeless tobacco: Never Used  . Alcohol use 0.0 oz/week     Comment: occasionally.     Allergies   Patient has no  known allergies.   Review of Systems Review of Systems  All other systems reviewed and are negative.    Physical Exam Updated Vital Signs BP 124/79   Pulse 74   Temp 97.6 F (36.4 C) (Oral)   Resp 17   Ht 5' (1.524 m)   Wt 135 lb (61.2 kg)   SpO2 100%   BMI 26.37 kg/m   Physical Exam  Constitutional: She is oriented to person, place, and time. She appears well-developed and well-nourished.  HENT:  Head: Normocephalic and atraumatic.  Significant JVD  Eyes: Conjunctivae and EOM are normal.  Neck: Normal range of motion.  Cardiovascular: Normal rate and regular rhythm.   Pulmonary/Chest: No stridor. No  respiratory distress. She has rales (mild).  Abdominal: Soft. She exhibits no distension. There is no tenderness.  Musculoskeletal: She exhibits edema (bilateral 2+ to the lower knee).  Neurological: She is alert and oriented to person, place, and time. No cranial nerve deficit. Coordination normal.  Skin: Skin is warm and dry.  Nursing note and vitals reviewed.    ED Treatments / Results  Labs (all labs ordered are listed, but only abnormal results are displayed) Labs Reviewed  BASIC METABOLIC PANEL  CBC  I-STAT [place], ED    EKG  EKG Interpretation None       Radiology Dg Chest 2 View  Result Date: 07/21/2016 CLINICAL DATA:  Short of breath EXAM: CHEST  2 VIEW COMPARISON:  06/26/2016 FINDINGS: Marked cardiac enlargement. Single lead pacemaker unchanged. Negative for edema or effusion. Negative for pneumonia. IMPRESSION: Cardiac enlargement.  No acute abnormality. Electronically Signed   By: Franchot Gallo M.D.   On: 07/21/2016 14:30    Procedures Procedures (including critical care time)  Medications Ordered in ED Medications - No data to display   Initial Impression / Assessment and Plan / ED Course  I have reviewed the triage vital signs and the nursing notes.  Pertinent labs & imaging results that were available during my care of the patient  were reviewed by me and considered in my medical decision making (see chart for details).    AoC CHF exacerbation, will dose 80 IV lasix and reevaluate for disposition. Still with tachypnea. Minimal diuresis. Will plan for admission.   Final Clinical Impressions(s) / ED Diagnoses   Final diagnoses:  Shortness of breath  Acute on chronic diastolic congestive heart failure (HCC)     Merrily Pew, MD 07/22/16 1049

## 2016-07-21 NOTE — H&P (Signed)
Date: 07/21/2016               Patient Name:  [name] MRN: [id number]  DOB: 27-Jan-1926 Age / Sex: 83 y.o., female   PCP: Sid Falcon, MD         Medical Service: Internal Medicine Teaching Service         Attending Physician: Dr. Annia Belt, MD    First Contact: Dr. Heber [place] Pager: [phone]  Second Contact: Dr. Posey Pronto Pager: [phone]       After Hours (After 5p/  First Contact Pager: [phone]  weekends / holidays): Second Contact Pager: [phone]   Chief Complaint: Leg swelling  History of Present Illness: [name] a 69 y/owoman with PMHx of sick sinus syndrome, single lead pacemaker, atrial fibrillation, HFrEF (LV EF 40-45% in Dec 2017), pulmonary HTN, CKD3, HTN, GERD, CVA in 2000 who presents with Increased leg swelling right leg greater than left. Patient reports an increase In leg swelling after Easter. She does admit to a higher salt diet during that time including potato salad and hot dogs.  She also reports increased shortness of breath in the past few days. She denies  nausea/vomiting, fever/chills cough, chest pain, or orthopnea. She always sleeps with 2 pillows and this has not changed recently.    Meds:  Current Meds  Medication Sig  . albuterol (PROVENTIL HFA;VENTOLIN HFA) 108 (90 Base) MCG/ACT inhaler Inhale 2 puffs into the lungs every 6 (six) hours as needed for wheezing or shortness of breath.  Marland Kitchen aspirin EC 81 MG tablet Take 81 mg by mouth daily.  Marland Kitchen dextromethorphan-guaiFENesin (MUCINEX DM) 30-600 MG 12hr tablet Take 1 tablet by mouth 2 (two) times daily. (Patient taking differently: Take 1 tablet by mouth 2 (two) times daily as needed for cough. )  . diclofenac sodium (VOLTAREN) 1 % GEL Apply 2 g topically 4 (four) times daily. To left shoulder. (Patient taking differently: Apply 2 g topically 4 (four) times daily as needed (for left shoulder pain). )  . docusate sodium (COLACE) 100 MG capsule Take 1 capsule (100 mg total) by mouth daily as  needed. (Patient taking differently: Take 100 mg by mouth daily as needed for mild constipation. )  . furosemide (LASIX) 20 MG tablet Take 4 tabs in the morning and 2 tabs in the evening. (Patient taking differently: Take 40-60 mg by mouth See admin instructions. 60 mg in the morning and 40 mg in the evening)  . lisinopril (PRINIVIL,ZESTRIL) 20 MG tablet TAKE 1 TABLET BY MOUTH DAILY  . loperamide (IMODIUM) 2 MG capsule Take 1 capsule (2 mg total) by mouth as needed for diarrhea or loose stools.  . metoprolol (LOPRESSOR) 100 MG tablet TAKE 1 TABLET(100 MG) BY MOUTH TWICE DAILY  . Nutritional Supplements ([place]) LIQD Take 237 mLs by mouth 3 (three) times daily.  . polyethylene glycol (MIRALAX / GLYCOLAX) packet Take 17 g by mouth daily as needed for mild constipation.  . potassium chloride 20 MEQ/15ML (10%) SOLN TAKE 15 MLS BY MOUTH DAILY     Allergies: Allergies as of 07/21/2016  . (No Known Allergies)   Past Medical History:  Diagnosis Date  . Adenomatous colon polyp    tubular  . Allergic rhinitis   . Anemia    Iron deficiency, does not tolerate po  . Arthritis    "all over my body"  . Atrial fibrillation (HCC)    on coumadin followed by Dr. Elie Confer  .  CHF (congestive heart failure) (HCC)    diastolic dysfunction  . Constipation   . Dementia   . Diverticulosis of colon (without mention of hemorrhage)   . DJD (degenerative joint disease)   . GERD (gastroesophageal reflux disease)   . History of sick sinus syndrome s/p pacemaker Dr. Lovena Le 2000  . History of trichomonal urethritis   . History of vaginal bleeding 05/2005   nagative endometrial biopsy  . Hypertension    well controlled on 3 agents  . Incontinence overflow, stress female    on vesicare  . Onychomycosis   . Osteoporosis   . Pacemaker   . Restrictive lung disease 1996   PFT's showed mild disease  . Stroke [facility]) 2002   R MCA, cardioembolic  . Type II diabetes mellitus (HCC)    diet  controlled  . Weight loss, unintentional 2010-2011   8/08-8/10 138-146 lbs, between 8/10-10/11 lost 18 lbs., work-up negative    Family History:  Family History  Problem Relation Age of Onset  . Diabetes Sister   . Stroke Sister   . Heart disease    . Heart disease Father      Social History: Tobacco use: denies Alcohol use: average 1 beer a week Illicit drug use: denies  Review of Systems: A complete ROS was negative except as per HPI.   Physical Exam: Blood pressure (!) 115/58, pulse 66, temperature 97.5 F (36.4 C), temperature source Oral, resp. rate 18, height 5' (1.524 m), weight 126 lb 8 oz (57.4 kg), SpO2 94 %. Physical Exam  Constitutional: She is well-developed, well-nourished, and in no distress.  Cardiovascular: Normal rate and regular rhythm.  Exam reveals no gallop and no friction rub.   No murmur heard. Pulmonary/Chest: Effort normal and breath sounds normal. No respiratory distress. She has no wheezes. She has no rales.  Abdominal: Soft. Bowel sounds are normal. She exhibits no distension and no mass. There is no tenderness. There is no rebound and no guarding.  Musculoskeletal:  2+ pitting edema in lower extremities bilaterally right leg greater than left.  Skin: Skin is warm and dry.    EKG: Atrial fibrillation with occasional ventricular paced beats  CXR:  FINDINGS: Marked cardiac enlargement. Single lead pacemaker unchanged. Negative for edema or effusion. Negative for pneumonia.  IMPRESSION: Cardiac enlargement.  No acute abnormality.  CBC    Component Value Date/Time   WBC 4.0 07/21/2016 1419   RBC 3.28 (L) 07/21/2016 1419   HGB 9.9 (L) 07/21/2016 1419   HCT 29.6 (L) 07/21/2016 1419   HCT 32.1 (L) 01/04/2016 1535   PLT 115 (L) 07/21/2016 1419   PLT 152 01/04/2016 1535   MCV 90.2 07/21/2016 1419   MCV 94 01/04/2016 1535   MCH 30.2 07/21/2016 1419   MCHC 33.4 07/21/2016 1419   RDW 14.8 07/21/2016 1419   RDW 16.8 (H) 01/04/2016 1535    LYMPHSABS 1.1 06/26/2016 1054   LYMPHSABS 1.2 05/20/2015 1120   MONOABS 0.4 06/26/2016 1054   EOSABS 0.1 06/26/2016 1054   EOSABS 0.1 05/20/2015 1120   BASOSABS 0.0 06/26/2016 1054   BASOSABS 0.0 05/20/2015 1120   BMET    Component Value Date/Time   NA 138 07/21/2016 1419   NA 138 07/04/2016 1211   K 4.1 07/21/2016 1419   CL 105 07/21/2016 1419   CO2 24 07/21/2016 1419   GLUCOSE 99 07/21/2016 1419   BUN 24 (H) 07/21/2016 1419   BUN 24 07/04/2016 1211   CREATININE 1.57 (H) 07/21/2016 1419  CREATININE 1.05 06/11/2014 1124   CALCIUM 9.5 07/21/2016 1419   GFRNONAA 28 (L) 07/21/2016 1419   GFRNONAA 48 (L) 06/11/2014 1124   GFRAA 33 (L) 07/21/2016 1419   GFRAA 56 (L) 06/11/2014 1124    Assessment & Plan by Problem: Active Problems:   Acute exacerbation of CHF (congestive heart failure) (HCC)  Acute on Chronic HFrEF Patient presents with leg swelling and shortness of breath. She reports an increase in the symptoms in the last week and has had a high salt diet recently.  On exam I did not appreciate bibasilar crackles and chest x-ray showed no pulmonary edema. BNP this admission 1,900. Her BNP appears to be chronically elevated even when not in acute heart failure.  Per chart review her dry weight appears to be 125 lbs. On admission her weight was 135 lbs. TTE in Dec 2017 showed LV EF 40-45% and pulmonary hypertension.  She also had a mild pericardial effusion along the right ventricular free wall.  She does have lower extremity edema and therefore concerned about right sided heart failure.  - Continue metoprolol 100mg  BID - Lasix 40mg  BID -Ted hose - Echo  Chronic kidney disease stage III Patient's baseline appears to be around 1.3 currently 1.5. Will monitor with diuresis. -BMET   Normocytic anemia Hemoglobin is stable at 9.9. Likely anemia of chronic disease. Will monitor with CBC -CBC in the morning  Hypertension Take lisinopril at home.  Blood pressure stable on  admission - Continue home Lisinopril 20mg    DVT prophylaxis: subq lovenox Code status:  Chest compressions only Diet: heart healthy   Dispo: Admit patient to Observation with expected length of stay less than 2 midnights.  Signed: Valinda Party, DO 07/21/2016, 6:51 PM  Pager: [phone]

## 2016-07-21 NOTE — ED Notes (Signed)
Report attempted 

## 2016-07-21 NOTE — ED Notes (Signed)
Report given.

## 2016-07-21 NOTE — Patient Outreach (Signed)
[facility]) Care Management  07/21/2016  [name] 12/15/1925 [id number]   Call placed to member to confirm home visit this morning, member state that she is not feeling well and does not feel up to visitors. She state that she is experiencing shortness of breath as well as ongoing swelling in her legs/feet.  She report that she has been taking her medications as instructed, unable to report what her weight was today.  She does confirm that she received the new scale provided by [facility].  Noted that member missed last appointment with her primary MD on 3/23, will call daughter to obtain additional information.  Call then placed to daughter, [name], to report that home visit for today has been canceled.  She state that the member has been complaining of shortness of breath for the past couple days.  Member missed appointment due to daughter's inability to provide transportation as she was having issues with her own health.  She state that she has tried several times to have her other siblings become more involved in the [facility] care but have been unsuccessful.    [name] voices concern regarding difficulty managing member's heart failure.  She state that she has been monitoring the member's weights more closely and has been providing more meals rather than allowing the member to prepare her own.  She state that the member does see a cardiologist twice a year, but her heart failure has been predominantly managed by her primary MD, has not been involved with the heart failure clinic.  She is open to a possible referral to the heart failure clinic, will discuss with primary MD.  She state that she will go check on the member, if shortness of breath not improved, advised to seek emergency medical attention.  She verbalizes understanding.  Home visit rescheduled for next week.  [name], [place], MSN [place] [phone]

## 2016-07-22 ENCOUNTER — Other Ambulatory Visit (HOSPITAL_COMMUNITY): Payer: Medicare HMO

## 2016-07-22 ENCOUNTER — Other Ambulatory Visit: Payer: Self-pay | Admitting: *Deleted

## 2016-07-22 DIAGNOSIS — N183 Chronic kidney disease, stage 3 (moderate): Secondary | ICD-10-CM

## 2016-07-22 DIAGNOSIS — Z833 Family history of diabetes mellitus: Secondary | ICD-10-CM

## 2016-07-22 DIAGNOSIS — I130 Hypertensive heart and chronic kidney disease with heart failure and stage 1 through stage 4 chronic kidney disease, or unspecified chronic kidney disease: Secondary | ICD-10-CM

## 2016-07-22 DIAGNOSIS — I313 Pericardial effusion (noninflammatory): Secondary | ICD-10-CM | POA: Diagnosis not present

## 2016-07-22 DIAGNOSIS — I872 Venous insufficiency (chronic) (peripheral): Secondary | ICD-10-CM | POA: Diagnosis not present

## 2016-07-22 DIAGNOSIS — I482 Chronic atrial fibrillation: Secondary | ICD-10-CM

## 2016-07-22 DIAGNOSIS — I2722 Pulmonary hypertension due to left heart disease: Secondary | ICD-10-CM | POA: Diagnosis not present

## 2016-07-22 DIAGNOSIS — K219 Gastro-esophageal reflux disease without esophagitis: Secondary | ICD-10-CM

## 2016-07-22 DIAGNOSIS — Z823 Family history of stroke: Secondary | ICD-10-CM

## 2016-07-22 DIAGNOSIS — I5023 Acute on chronic systolic (congestive) heart failure: Secondary | ICD-10-CM | POA: Diagnosis not present

## 2016-07-22 DIAGNOSIS — Z8673 Personal history of transient ischemic attack (TIA), and cerebral infarction without residual deficits: Secondary | ICD-10-CM

## 2016-07-22 DIAGNOSIS — D649 Anemia, unspecified: Secondary | ICD-10-CM | POA: Diagnosis not present

## 2016-07-22 DIAGNOSIS — Z8249 Family history of ischemic heart disease and other diseases of the circulatory system: Secondary | ICD-10-CM | POA: Diagnosis not present

## 2016-07-22 DIAGNOSIS — Z950 Presence of cardiac pacemaker: Secondary | ICD-10-CM

## 2016-07-22 LAB — CBC
HCT: 27.7 % — ABNORMAL LOW (ref 36.0–46.0)
Hemoglobin: 9.2 g/dL — ABNORMAL LOW (ref 12.0–15.0)
MCH: 30 pg (ref 26.0–34.0)
MCHC: 33.2 g/dL (ref 30.0–36.0)
MCV: 90.2 fL (ref 78.0–100.0)
Platelets: 110 10*3/uL — ABNORMAL LOW (ref 150–400)
RBC: 3.07 MIL/uL — AB (ref 3.87–5.11)
RDW: 15.2 % (ref 11.5–15.5)
WBC: 3.9 10*3/uL — AB (ref 4.0–10.5)

## 2016-07-22 LAB — GLUCOSE, CAPILLARY: Glucose-Capillary: 101 mg/dL — ABNORMAL HIGH (ref 65–99)

## 2016-07-22 LAB — BASIC METABOLIC PANEL
Anion gap: 9 (ref 5–15)
BUN: 26 mg/dL — ABNORMAL HIGH (ref 6–20)
CALCIUM: 8.9 mg/dL (ref 8.9–10.3)
CO2: 27 mmol/L (ref 22–32)
CREATININE: 1.51 mg/dL — AB (ref 0.44–1.00)
Chloride: 99 mmol/L — ABNORMAL LOW (ref 101–111)
GFR, EST AFRICAN AMERICAN: 34 mL/min — AB (ref >60)
GFR, EST NON AFRICAN AMERICAN: 30 mL/min — AB (ref >60)
Glucose, Bld: 116 mg/dL — ABNORMAL HIGH (ref 65–99)
Potassium: 4.2 mmol/L (ref 3.5–5.1)
SODIUM: 135 mmol/L (ref 135–145)

## 2016-07-22 NOTE — Progress Notes (Signed)
Pt stated she does not want to wear her TED hose because they are too tight and painful. Ordered pt a new larger size.  Eyoel Throgmorton Leory Plowman

## 2016-07-22 NOTE — Progress Notes (Signed)
   Subjective: Patient was evaluated this morning on rounds. She states that her shortness of breath has improved overnight and that her leg swelling has decreased. She denies any chest pain.  Objective:  Vital signs in last 24 hours: Vitals:   07/22/16 0441 07/22/16 0500 07/22/16 0920 07/22/16 1200  BP:  (!) 114/52 (!) 110/51 (!) 116/47  Pulse:  61  62  Resp:  18  18  Temp:  97.3 F (36.3 C) 98.2 F (36.8 C) 97.5 F (36.4 C)  TempSrc:  Oral Oral Oral  SpO2:  100% 96% 95%  Weight: 124 lb 3.2 oz (56.3 kg)     Height:       Physical Exam  Cardiovascular: Normal rate and normal heart sounds.  Exam reveals no gallop and no friction rub.   No murmur heard. Pulmonary/Chest: Effort normal and breath sounds normal. No respiratory distress. She has no wheezes. She has no rales.  Abdominal: Soft. She exhibits no distension. There is no tenderness.  Musculoskeletal:  1+ pitting edema lower extremities bilaterally. Greatly improved from yesterday.  Neurological: She is alert.  Skin: Skin is warm and dry.    Assessment/Plan:  Principal Problem:   Acute exacerbation of CHF (congestive heart failure) (HCC) Active Problems:   Hypertension   Osteoporosis   History of sick sinus syndrome   Pacemaker-Medtronic   GERD (gastroesophageal reflux disease)   Chronic systolic heart failure (HCC)   Chronic atrial fibrillation (HCC)  Acute on Chronic HFrEF Patient showed significant improvement with IV Lasix 80 mg once yesterday with breathing and leg swelling.  We are continuing with Lasix 40 IV BID.  Patient had a moderate pericardial effusion on echo in December 2017.  Will do a repeat echo on this admission to assess effusion. -Continue metoprolol 100mg  BID - Lasix 40mg  BID -Ted hose - Echo results pending  Chronic kidney disease stage III Patient's baseline appears to be around 1.3 currently 1.5. Will monitor with diuresis. -BMET   Normocytic anemia Hemoglobin is stable at 9.2.  Likely anemia of chronic disease.   Hypertension Takes lisinopril at home.  Blood pressure stable. - Continue home Lisinopril 20mg    DVT prophylaxis: subq lovenox Code status:  Chest compressions only Diet: heart healthy   Dispo: Anticipated discharge likely in the next day after further diuresis.   Valinda Party, DO 07/22/2016, 1:17 PM Pager: [phone]

## 2016-07-22 NOTE — Progress Notes (Signed)
Patient with no complaints or concerns during 7pm - 7am shift. Slept during the night.   Jaramie Bastos, RN 

## 2016-07-22 NOTE — Evaluation (Signed)
Occupational Therapy Evaluation Patient Details Name: [name] MRN: [id number] DOB: May 17, 1925 Today's Date: 07/22/2016    History of Present Illness 83 y/o woman with PMHx of sick sinus syndrome, single lead pacemaker, atrial fibrillation, HFrEF (LV EF 40-45% in Dec 2017), pulmonary HTN, CKD3, HTN, GERD, CVA in 2000 who presents with Increased leg swelling right leg greater than left and increased shortness of breath since Easter.    Clinical Impression   Pt admitted with above and presents to OT with decreased activity tolerance impacting ability to complete ADLs at PLOF.  Pt received upright in recliner after lunch, reporting feeling much better this afternoon.  Completed simulated LB dressing with good balance reactions and then requested to return to bed.  Completed stand pivot transfer without use of RW with min guard.  Pt will benefit from OT acutely to increase safety with mobility and ADLs in preparation to d/c home with [place].    Follow Up Recommendations  Home health OT    Equipment Recommendations  None recommended by OT    Recommendations for Other Services       Precautions / Restrictions Precautions Precautions: Fall Precaution Comments: Supervision for safety Restrictions Weight Bearing Restrictions: No      Mobility Bed Mobility Overal bed mobility: Needs Assistance Bed Mobility: Sit to Supine     Supine to sit: Min guard Sit to supine: Supervision   General bed mobility comments: Needed repeat cues and encouragement, HOB elevated, used rail  Transfers Overall transfer level: Needs assistance Equipment used: Straight cane;Rolling walker (2 wheeled) Transfers: Sit to/from [facility] Sit to Stand: Min guard Stand pivot transfers: Min guard       General transfer comment: completed without use of AD, requires min cues for safety    Balance Overall balance assessment: Needs assistance   Sitting balance-Leahy Scale: Fair      Standing balance support: Single extremity supported Standing balance-Leahy Scale: Poor                             ADL either performed or assessed with clinical judgement   ADL Overall ADL's : At baseline (per report);Needs assistance/impaired             Lower Body Bathing: Supervison/ safety;Set up       Lower Body Dressing: Set up;Supervision/safety   Toilet Transfer: Stand-pivot;Min guard;BSC                   Vision Patient Visual Report: No change from baseline Vision Assessment?: No apparent visual deficits            Pertinent Vitals/Pain Pain Assessment: No/denies pain     Hand Dominance Right   Extremity/Trunk Assessment Upper Extremity Assessment Upper Extremity Assessment: Overall WFL for tasks assessed;Generalized weakness   Lower Extremity Assessment Lower Extremity Assessment: Overall WFL for tasks assessed;Generalized weakness       Communication Communication Communication: HOH   Cognition Arousal/Alertness: Awake/alert Behavior During Therapy: WFL for tasks assessed/performed Overall Cognitive Status: Within Functional Limits for tasks assessed                                 General Comments: Some decreased safety, may be related to hearing loss.  Likes to joke   General Comments  Bilateral LE edema, reducing per patient, cues to initiate activity, once moving did well.  Home Living Family/patient expects to be discharged to:: Private residence Living Arrangements: Alone Available Help at Discharge: Family;Available PRN/intermittently Type of Home: House Home Access: Level entry     Home Layout: One level     Bathroom Shower/Tub: Teacher, early years/pre: Standard Bathroom Accessibility: Yes   Home Equipment: Cane - single point;Bedside commode;Walker - 4 wheels   Additional Comments: Family assists, reports she only takes a shower when her daughter is present       Prior Functioning/Environment Level of Independence: Independent with assistive device(s)        Comments: children come by daily and wash pts clothes, clean house.        OT Problem List: Decreased strength;Decreased activity tolerance;Impaired balance (sitting and/or standing);Decreased safety awareness;Cardiopulmonary status limiting activity;Increased edema      OT Treatment/Interventions: Self-care/ADL training;Energy conservation;DME and/or AE instruction;Patient/family education    OT Goals(Current goals can be found in the care plan section) Acute Rehab OT Goals Patient Stated Goal: Get rid of swelling in legs OT Goal Formulation: With patient Time For Goal Achievement: 08/05/16 Potential to Achieve Goals: Good  OT Frequency: Min 2X/week    End of Session Nurse Communication: Mobility status  Activity Tolerance: Patient tolerated treatment well Patient left: in bed;with call bell/phone within reach;with bed alarm set  OT Visit Diagnosis: Unsteadiness on feet (R26.81);Muscle weakness (generalized) (M62.81)                Time: 4142-3953 OT Time Calculation (min): 11 min Charges:  OT General Charges $OT Visit: 1 Procedure OT Evaluation $OT Eval Low Complexity: 1 Procedure G-Codes: OT G-codes **NOT FOR INPATIENT CLASS** Functional Assessment Tool Used: AM-PAC 6 Clicks Daily Activity Functional Limitation: Self care Self Care Current Status (U0233): At least 1 percent but less than 20 percent impaired, limited or restricted Self Care Goal Status (I3568): At least 1 percent but less than 20 percent impaired, limited or restricted    Simonne Come, [phone] 07/22/2016, 3:01 PM

## 2016-07-22 NOTE — Progress Notes (Signed)
Pt family called, pt stated it is okay to give update to family. Family advised we do not have visiting hours and can come anytime.   [name]

## 2016-07-22 NOTE — Progress Notes (Signed)
Initial Nutrition Assessment  DOCUMENTATION CODES:   Non-severe (moderate) malnutrition in context of chronic illness  INTERVENTION:    Continue Ensure Enlive po TID, each supplement provides 350 kcal and 20 grams of protein  NUTRITION DIAGNOSIS:   Malnutrition (Moderate) related to chronic illness (CHF, CKD) as evidenced by moderate depletion of body fat, moderate depletions of muscle mass  GOAL:   Patient will meet greater than or equal to 90% of their needs  MONITOR:   PO intake, Supplement acceptance, Labs, Weight trends, I & O's  REASON FOR ASSESSMENT:   Malnutrition Screening Tool, Consult Diet education  ASSESSMENT:   83 yo Female with PMHx of sick sinus syndrome, single lead pacemaker, atrial fibrillation,HFrEF (LV EF 40-45% in Dec 2017), pulmonary HTN, CKD3, HTN, GERD, CVA in 2000 who presents with increased leg swelling right leg greater than left. She also reports increased shortness of breath in the past few days.   Pt slightly confused upon RD visit. States she typically eats 3 meals per day at home. Drinks Ensure oral nutrition supplements as well.  Has currently ordered TID. Unable to report whether she's recently had unintentional weight loss. Labs and medications reviewed. CBG 101.  Nutrition-Focused physical exam completed. Findings are moderate fat depletion, moderate muscle depletion, and no edema.   Diet Order:  Diet heart healthy/carb modified Room service appropriate? Yes; Fluid consistency: Thin  Skin:  Reviewed, no issues  Last BM:  4/4  Height:   Ht Readings from Last 1 Encounters:  07/21/16 5' (1.524 m)   Weight:   Wt Readings from Last 1 Encounters:  07/22/16 124 lb 3.2 oz (56.3 kg)   Ideal Body Weight:  45.4 kg  BMI:  Body mass index is 24.26 kg/m.  Estimated Nutritional Needs:   Kcal:  1500-1700  Protein:  70-80 gm  Fluid:  1.5-1.7 L  EDUCATION NEEDS:   Education needs not appropriate at this time  Arthur Holms, RD, LDN Pager #: [phone] After-Hours Pager #: [phone]

## 2016-07-22 NOTE — Consult Note (Addendum)
[facility] CM Inpatient Consult   07/22/2016  [name] 1925/10/12 [id number]  Patient discussed in progress meeting this morning.  Patient is currently active with [facility] for chronic disease management services.  Patient has been engaged by a [employer].  Chart review reveals the patient is [name] a 53 y/owoman with PMHx of sick sinus syndrome, single lead pacemaker, atrial fibrillation,HFrEF (LV EF 40-45% in Dec 2017), pulmonary HTN, CKD3, HTN, GERD, CVA in 2000 who presents with Increased leg swelling right leg greater than left. Patient reports an increase In leg swelling after Easter. She does admit to a higher salt diet during that time including potato salad and hot dogs.  She also reports increased shortness of breath in the past few days. She denies  nausea/vomiting, fever/chills cough, chest pain, or orthopnea. She always sleeps with 2 pillows and this has not changed recently.    Patient is currently working with physical therapy.  Made Inpatient Case Manager aware that [facility] following. Of note, [facility] Care Management services does not replace or interfere with any services that are needed or arranged by inpatient case management or social work.  For additional questions or referrals please contact:   1:05 pm - Met with the patient at the beside.  Patient endorses ongoing follow up with [facility]. No stated changes in her needs. Follow up for ongoing education on salty foods is encouraged.   [name], RN BSN [facility] Liaison  [phone] business mobile phone Toll free office [phone]

## 2016-07-22 NOTE — Care Management Note (Signed)
Case Management Note  Patient Details  Name: [name] MRN: [id number] Date of Birth: 10/22/1925  Subjective/Objective:      Admitted with CHF             Action/Plan: Patient lives at home alone, PCP is Sid Falcon, MD; has prescription drug coverage with [facility] with prescription drug coverage; pharmacy of choice [facility]; patient states that she is receiving HHC through [facility] and is requesting a rolling walker with a seat; she has a cane at home  Expected Discharge Date:  07/23/16               Expected Discharge Plan:  [place]  In-House Referral:   [facility]  Discharge planning Services  CM Consult  Choice offered to:  Patient  DME Arranged:  Walker rolling with seat DME Agency:  [place] Arranged:  RN, Disease Management, PT [place] Agency:  [place]  Status of Service:  In process, will continue to follow  Sherrilyn Rist [phone] 07/22/2016, 10:46 AM

## 2016-07-22 NOTE — Patient Outreach (Signed)
[facility]) Care Management  07/22/2016  NIEMA CARRARA Jun 18, 1925 [id number]   Call received from daughter, [name], stating that the member was admitted to the hospital yesterday for congestive heart failure.  Will notify hospital liaisons, will follow up with member and daughter upon discharge.  [name], [place], MSN [place] [place] [phone]

## 2016-07-22 NOTE — Progress Notes (Signed)
OT Cancellation Note  Patient Details Name: [name] MRN: [id number] DOB: Apr 12, 1926   Cancelled Treatment:    Reason Eval/Treat Not Completed: Patient declined, no reason specified;Fatigue/lethargy limiting ability to participate  Therapist attempted to see pt x2 this AM with pt initially refusing due to eating breakfast and then refusing as she had just returned to bed and "that was enough for this morning".  Will attempt to follow up after lunch as pt willing to participate.  Simonne Come 07/22/2016, 10:10 AM

## 2016-07-22 NOTE — Progress Notes (Signed)
Physical Therapy Evaluation Patient Details Name: [name] MRN: [id number] DOB: 1925/09/05 Today's Date: 07/22/2016   History of Present Illness  83 y/o woman with PMHx of sick sinus syndrome, single lead pacemaker, atrial fibrillation, HFrEF (LV EF 40-45% in Dec 2017), pulmonary HTN, CKD3, HTN, GERD, CVA in 2000 who presents with Increased leg swelling right leg greater than left and increased shortness of breath since Easter.   Clinical Impression  Patient initially declined therapy evaluation, but agreed after encouragement and offer of assist to chair for lunch.  Once up, patient participated with much more activity, MIN assist to MIN guard overall for safety.  Per patient, swelling is decreasing, not 'tight' anymore, and no shortness of breath with activity.  Gait speed is slow, patient performed better with walker vs cane, but patient prefers cane + furniture to walk in house.  Patient will benefit from additional PT for strengthening and endurance training, will continue with PT services.    Follow Up Recommendations Home health PT    Equipment Recommendations  None recommended by PT (Patient reports she has a rollator at home and cane)    Recommendations for Other Services OT consult     Precautions / Restrictions Precautions Precautions: Fall Precaution Comments: Supervision for safety Restrictions Weight Bearing Restrictions: No      Mobility  Bed Mobility Overal bed mobility: Needs Assistance Bed Mobility: Supine to Sit     Supine to sit: Min guard     General bed mobility comments: Needed repeat cues and encouragement, HOB elevated, used rail  Transfers Overall transfer level: Needs assistance Equipment used: Straight cane;Rolling walker (2 wheeled) Transfers: Sit to/from [facility] Sit to Stand: Min guard Stand pivot transfers: Min guard       General transfer comment: Support for safety   Ambulation/Gait Ambulation/Gait  assistance: Min guard;Supervision Ambulation Distance (Feet): 80 Feet Assistive device: Straight cane;Rolling walker (2 wheeled) Gait Pattern/deviations: Step-through pattern;Decreased stride length;Trunk flexed   Gait velocity interpretation: Below normal speed for age/gender General Gait Details: Prefers cane, but seeks 2nd UE support.  Better with walker, except for tight space navigation.   Stairs            Wheelchair Mobility    Modified Rankin (Stroke Patients Only)       Balance Overall balance assessment: Needs assistance   Sitting balance-Leahy Scale: Fair     Standing balance support: Single extremity supported Standing balance-Leahy Scale: Poor                               Pertinent Vitals/Pain Pain Assessment: No/denies pain    Home Living Family/patient expects to be discharged to:: Private residence Living Arrangements: Alone Available Help at Discharge: Family;Available PRN/intermittently Type of Home: House Home Access: Level entry     Home Layout: One level Home Equipment: Cane - single point;Bedside commode;Walker - 4 wheels Additional Comments: Family assists    Prior Function Level of Independence: Independent with assistive device(s)         Comments: children come by daily and wash pts clothes, clean house.     Hand Dominance        Extremity/Trunk Assessment   Upper Extremity Assessment Upper Extremity Assessment: Defer to OT evaluation;Overall [facility] for tasks assessed    Lower Extremity Assessment Lower Extremity Assessment: Overall WFL for tasks assessed;Generalized weakness       Communication   Communication: HOH  Cognition Arousal/Alertness:  Awake/alert Behavior During Therapy: WFL for tasks assessed/performed Overall Cognitive Status: Within Functional Limits for tasks assessed                                 General Comments: Some decreased safety, may be related to hearing loss.   Likes to joke      General Comments General comments (skin integrity, edema, etc.): Bilateral LE edema, reducing per patient, cues to initiate activity, once moving did well.    Exercises     Assessment/Plan    PT Assessment Patient needs continued PT services  PT Problem List Decreased strength;Decreased activity tolerance;Decreased balance;Decreased mobility;Decreased knowledge of use of DME;Decreased safety awareness       PT Treatment Interventions Gait training;Functional mobility training;DME instruction;Therapeutic activities;Therapeutic exercise;Patient/family education    PT Goals (Current goals can be found in the Care Plan section)  Acute Rehab PT Goals Patient Stated Goal: Get rid of swelling in legs PT Goal Formulation: With patient Time For Goal Achievement: 08/05/16 Potential to Achieve Goals: Good    Frequency Min 3X/week   Barriers to discharge        Co-evaluation               End of Session Equipment Utilized During Treatment: Gait belt Activity Tolerance: Patient tolerated treatment well Patient left: in chair;with call bell/phone within reach;with chair alarm set Nurse Communication: Mobility status PT Visit Diagnosis: Unsteadiness on feet (R26.81);Muscle weakness (generalized) (M62.81)    Time: 1225-1300 PT Time Calculation (min) (ACUTE ONLY): 35 min   Charges:   PT Evaluation $PT Eval Low Complexity: 1 Procedure PT Treatments $Gait Training: 8-22 mins   PT G Codes:   PT G-Codes **NOT FOR INPATIENT CLASS** Functional Assessment Tool Used: AM-PAC 6 Clicks Basic Mobility;Clinical judgement Functional Limitation: Mobility: Walking and moving around Mobility: Walking and Moving Around Current Status (W2376): At least 40 percent but less than 60 percent impaired, limited or restricted Mobility: Walking and Moving Around Goal Status [phone]): At least 1 percent but less than 20 percent impaired, limited or restricted    Judith Blonder,  DPT  Zenia Resides, Tianni Escamilla L 07/22/2016, 1:46 PM

## 2016-07-23 ENCOUNTER — Other Ambulatory Visit: Payer: Self-pay | Admitting: Internal Medicine

## 2016-07-23 ENCOUNTER — Observation Stay (HOSPITAL_BASED_OUTPATIENT_CLINIC_OR_DEPARTMENT_OTHER): Payer: Medicare HMO

## 2016-07-23 DIAGNOSIS — Z950 Presence of cardiac pacemaker: Secondary | ICD-10-CM | POA: Diagnosis not present

## 2016-07-23 DIAGNOSIS — I270 Primary pulmonary hypertension: Secondary | ICD-10-CM | POA: Diagnosis not present

## 2016-07-23 DIAGNOSIS — I872 Venous insufficiency (chronic) (peripheral): Secondary | ICD-10-CM

## 2016-07-23 DIAGNOSIS — Z8673 Personal history of transient ischemic attack (TIA), and cerebral infarction without residual deficits: Secondary | ICD-10-CM | POA: Diagnosis not present

## 2016-07-23 DIAGNOSIS — I319 Disease of pericardium, unspecified: Secondary | ICD-10-CM | POA: Diagnosis not present

## 2016-07-23 DIAGNOSIS — M810 Age-related osteoporosis without current pathological fracture: Secondary | ICD-10-CM | POA: Diagnosis not present

## 2016-07-23 DIAGNOSIS — I482 Chronic atrial fibrillation: Secondary | ICD-10-CM | POA: Diagnosis not present

## 2016-07-23 DIAGNOSIS — D649 Anemia, unspecified: Secondary | ICD-10-CM | POA: Diagnosis not present

## 2016-07-23 DIAGNOSIS — K219 Gastro-esophageal reflux disease without esophagitis: Secondary | ICD-10-CM | POA: Diagnosis not present

## 2016-07-23 DIAGNOSIS — N183 Chronic kidney disease, stage 3 (moderate): Secondary | ICD-10-CM | POA: Diagnosis not present

## 2016-07-23 DIAGNOSIS — I130 Hypertensive heart and chronic kidney disease with heart failure and stage 1 through stage 4 chronic kidney disease, or unspecified chronic kidney disease: Secondary | ICD-10-CM | POA: Diagnosis not present

## 2016-07-23 DIAGNOSIS — I5023 Acute on chronic systolic (congestive) heart failure: Secondary | ICD-10-CM | POA: Diagnosis not present

## 2016-07-23 DIAGNOSIS — I5032 Chronic diastolic (congestive) heart failure: Secondary | ICD-10-CM

## 2016-07-23 LAB — BASIC METABOLIC PANEL
ANION GAP: 8 (ref 5–15)
BUN: 29 mg/dL — ABNORMAL HIGH (ref 6–20)
CO2: 31 mmol/L (ref 22–32)
Calcium: 9.1 mg/dL (ref 8.9–10.3)
Chloride: 97 mmol/L — ABNORMAL LOW (ref 101–111)
Creatinine, Ser: 1.39 mg/dL — ABNORMAL HIGH (ref 0.44–1.00)
GFR calc Af Amer: 38 mL/min — ABNORMAL LOW (ref >60)
GFR calc non Af Amer: 33 mL/min — ABNORMAL LOW (ref >60)
Glucose, Bld: 166 mg/dL — ABNORMAL HIGH (ref 65–99)
POTASSIUM: 4.1 mmol/L (ref 3.5–5.1)
Sodium: 136 mmol/L (ref 135–145)

## 2016-07-23 LAB — CBC
HEMATOCRIT: 29.2 % — AB (ref 36.0–46.0)
Hemoglobin: 9.7 g/dL — ABNORMAL LOW (ref 12.0–15.0)
MCH: 30.1 pg (ref 26.0–34.0)
MCHC: 33.2 g/dL (ref 30.0–36.0)
MCV: 90.7 fL (ref 78.0–100.0)
PLATELETS: 121 10*3/uL — AB (ref 150–400)
RBC: 3.22 MIL/uL — AB (ref 3.87–5.11)
RDW: 15 % (ref 11.5–15.5)
WBC: 4 10*3/uL (ref 4.0–10.5)

## 2016-07-23 LAB — ECHOCARDIOGRAM COMPLETE
Height: 60 in
Weight: 1913.6 oz

## 2016-07-23 MED ORDER — FUROSEMIDE 20 MG PO TABS: 40.0000 mg | ORAL_TABLET | ORAL | 0 refills | Status: DC

## 2016-07-23 MED ORDER — FUROSEMIDE 40 MG PO TABS
60.0000 mg | ORAL_TABLET | Freq: Every day | ORAL | Status: DC
  Administered 2016-07-23: 13:00:00 60 mg via ORAL

## 2016-07-23 MED ORDER — FUROSEMIDE 40 MG PO TABS: 40.0000 mg | ORAL_TABLET | Freq: Every day | ORAL | Status: DC

## 2016-07-23 MED ORDER — FUROSEMIDE 40 MG PO TABS
60.0000 mg | ORAL_TABLET | ORAL | Status: DC
  Filled 2016-07-23: qty 1

## 2016-07-23 MED ORDER — FUROSEMIDE 40 MG PO TABS: 40.0000 mg | ORAL_TABLET | ORAL | Status: DC

## 2016-07-23 NOTE — Discharge Summary (Signed)
Name: [name] MRN: [id number] DOB: 1925/09/09 83 y.o. PCP: Sid Falcon, MD  Date of Admission: 07/21/2016  2:33 PM Date of Discharge: 07/23/2016 Attending Physician: Dr. Beryle Beams  Discharge Diagnosis: 1. Mild Acute CHF Exacerbation Principal Problem:   Acute exacerbation of CHF (congestive heart failure) (HCC) Active Problems:   Hypertension   Osteoporosis   History of sick sinus syndrome   Pacemaker-Medtronic   GERD (gastroesophageal reflux disease)   Chronic systolic heart failure (HCC)   Chronic atrial fibrillation (HCC)   Primary pulmonary HTN (HCC)   Venous (peripheral) insufficiency   Discharge Medications: Allergies as of 07/23/2016   No Known Allergies     Medication List    TAKE these medications   albuterol 108 (90 Base) MCG/ACT inhaler Commonly known as:  PROVENTIL HFA;VENTOLIN HFA Inhale 2 puffs into the lungs every 6 (six) hours as needed for wheezing or shortness of breath.   aspirin EC 81 MG tablet Take 81 mg by mouth daily.   dextromethorphan-guaiFENesin 30-600 MG 12hr tablet Commonly known as:  MUCINEX DM Take 1 tablet by mouth 2 (two) times daily. What changed:  when to take this  reasons to take this   diclofenac sodium 1 % Gel Commonly known as:  VOLTAREN Apply 2 g topically 4 (four) times daily. To left shoulder. What changed:  when to take this  reasons to take this  additional instructions   docusate sodium 100 MG capsule Commonly known as:  COLACE Take 1 capsule (100 mg total) by mouth daily as needed. What changed:  reasons to take this   [place] Take 237 mLs by mouth 3 (three) times daily.   furosemide 20 MG tablet Commonly known as:  LASIX Take 2-3 tablets (40-60 mg total) by mouth See admin instructions. 60 mg in the morning and 40 mg in the evening What changed:  how much to take  how to take this  when to take this  additional instructions   lisinopril 20 MG  tablet Commonly known as:  PRINIVIL,ZESTRIL TAKE 1 TABLET BY MOUTH DAILY   loperamide 2 MG capsule Commonly known as:  IMODIUM Take 1 capsule (2 mg total) by mouth as needed for diarrhea or loose stools.   metoprolol 100 MG tablet Commonly known as:  LOPRESSOR TAKE 1 TABLET(100 MG) BY MOUTH TWICE DAILY   polyethylene glycol packet Commonly known as:  MIRALAX / GLYCOLAX Take 17 g by mouth daily as needed for mild constipation.   potassium chloride 20 MEQ/15ML (10%) Soln TAKE 15 MLS BY MOUTH DAILY       Disposition and follow-up:   [name] was discharged from [facility] in Good condition.  At the hospital follow up visit please address:  1.  Volume Status:  Please assess for symptoms of dyspnea and peripheral edema. Prior home Lasix (60 mg AM and 40 mg PM) was continued on discharge, adjust as needed.  2.  Labs / imaging needed at time of follow-up: none  3.  Pending labs/ test needing follow-up: none  Follow-up Appointments: Follow-up Information    [facility] Follow up.   Why:  They will do your home health care at your home Contact information: [place] [address] [phone]        Gilles Chiquito, MD.   Specialty:  Internal Medicine Why:  Office will contact patient with follow up appointment  Contact information: [address].  [place] [address] [phone]           Hospital Course by problem list: Principal Problem:   Acute exacerbation of CHF (congestive heart failure) (HCC) Active Problems:   Hypertension   Osteoporosis   History of sick sinus syndrome   Pacemaker-Medtronic   GERD (gastroesophageal reflux disease)   Chronic systolic heart failure (HCC)   Chronic atrial fibrillation (HCC)   Primary pulmonary HTN (HCC)   Venous (peripheral) insufficiency   Mild Acute Exacerbation of CHF: Patient presented with dyspnea on exertion and increased lower extremity edema.  Her weight was 126.5 lbs on admission. She was diuresed with IV Lasix 40 mg twice daily with significant improvement in her dyspnea or peripheral edema. She was discharged on 07/23/16 back on her home Lasix (60 mg AM and 40 mg PM). On day of discharge, her peripheral edema and dyspnea on exertion had resolved. A repeat TTE was performed to reassess a moderate pericardial effusion from prior TTE (03/2016) which did not show any change from prior.  Discharge Vitals:   BP (!) 105/57 (BP Location: Right Arm)   Pulse 73   Temp 97.7 F (36.5 C) (Oral)   Resp 18   Ht 5' (1.524 m)   Wt 119 lb 9.6 oz (54.3 kg)   SpO2 100%   BMI 23.36 kg/m   Pertinent Labs, Studies, and Procedures:  1. CXR 2 View 07/21/2016 Marked cardiac enlargement. Single lead pacemaker unchanged. Negative for edema or effusion. Negative for pneumonia.  IMPRESSION: Cardiac enlargement.  No acute abnormality.  2. TTE 07/23/2016  - Left ventricle: The cavity size was normal. Systolic function was   mildly reduced. The estimated ejection fraction was in the range   of 45% to 50%. Diffuse hypokinesis. - Aortic valve: There was mild regurgitation. - Mitral valve: Calcified annulus. Moderately thickened, moderately   calcified leaflets . There was mild regurgitation. - Left atrium: The atrium was moderately dilated. - Right ventricle: The cavity size was severely dilated. Wall   thickness was normal. Systolic function was severely reduced. - Right atrium: The atrium was severely dilated. Pacer wire or   catheter noted in right atrium. - Tricuspid valve: There was moderate regurgitation. - Pulmonary arteries: Systolic pressure was moderately increased.   PA peak pressure: 51 mm Hg (S). - Inferior vena cava: The vessel was dilated. The respirophasic   diameter changes were blunted (< 50%), consistent with elevated   central venous pressure.  Impressions:  - There is moderate pericardial effusion around the inferolateral    LV wall and RV free wall. No signs of tamponade.   No significant change since the prior study   Discharge Instructions: Discharge Instructions    (HEART FAILURE PATIENTS) Call MD:  Anytime you have any of the following symptoms: 1) 3 pound weight gain in 24 hours or 5 pounds in 1 week 2) shortness of breath, with or without a dry hacking cough 3) swelling in the hands, feet or stomach 4) if you have to sleep on extra pillows at night in order to breathe.    Complete by:  As directed    Call MD for:  difficulty breathing, headache or visual disturbances    Complete by:  As directed    Call MD for:  extreme fatigue    Complete by:  As directed    Call MD for:  persistant dizziness or light-headedness    Complete by:  As directed    Call MD for:  persistant nausea  and vomiting    Complete by:  As directed    Call MD for:  severe uncontrolled pain    Complete by:  As directed    Call MD for:  temperature >100.4    Complete by:  As directed    Diet - low sodium heart healthy    Complete by:  As directed    Discharge instructions    Complete by:  As directed    Please assess volume status and CHF symptoms. Adjust home Lasix as needed.   Increase activity slowly    Complete by:  As directed       Signed: Zada Finders, MD 07/26/2016, 4:15 PM

## 2016-07-23 NOTE — Progress Notes (Signed)
PT Cancellation Note  Patient Details Name: [name] MRN: [id number] DOB: 03-13-1926   Cancelled Treatment:    Reason Eval/Treat Not Completed: Patient declined, no reason specified. Pt refusing to participate in therapy session despite max encouragement. PT will continue to f/u with pt as available.    Clearnce Sorrel Caryssa Elzey 07/23/2016, 12:49 PM

## 2016-07-23 NOTE — Discharge Instructions (Signed)
Please continue your Lasix at home. Take 60 mg in the morning and 40 mg at night. Please try to wear the compression socks during the day. Try to elevate your legs at night with pillows above the level of your chest.  Please follow up in the Internal Medicine Clinic in the next week.

## 2016-07-23 NOTE — Progress Notes (Signed)
Patient with no complaints or concerns during 7pm - 7am shift. Slept during the night.   Denzil Mceachron, RN 

## 2016-07-23 NOTE — Progress Notes (Addendum)
Subjective:  Patient feels much better today, back to her baseline, eating breakfast. She says her dyspnea on exertion is improved as well as her leg edema. She is scheduled to have a TTE today. She feels ready to go home.  Objective:  Vital signs in last 24 hours: Vitals:   07/22/16 2211 07/23/16 0150 07/23/16 0552 07/23/16 0942  BP: 102/82 112/60 (!) 107/46 (!) 101/45  Pulse: 63 62 63 76  Resp:  16 18   Temp:  97.5 F (36.4 C) 97.7 F (36.5 C)   TempSrc:  Oral Oral   SpO2:  94% 100%   Weight:   119 lb 9.6 oz (54.3 kg)   Height:       General: resting in bed, no acute distress Cardiac: irr irr, systolic murmurs heard at LLS border and apex Pulm: clear to auscultation bilaterally, moving normal volumes of air Ext: warm and well perfused, trace pedal edema Neuro: alert and oriented X3   Assessment/Plan:  Principal Problem:   Acute exacerbation of CHF (congestive heart failure) (HCC) Active Problems:   Hypertension   Osteoporosis   History of sick sinus syndrome   Pacemaker-[employer]   GERD (gastroesophageal reflux disease)   Chronic systolic heart failure (HCC)   Chronic atrial fibrillation (HCC)  Acute on Chronic HFrEF Patient showed significant improvement with IV Lasix. Patient had a moderate pericardial effusion on echo in December 2017 with mod-severe MVR and severe TR.  Repeat echo in process. Will transition back to home oral lasix dosing today. - Continue metoprolol 100mg  BID - Lasix PO 60 mg am and 40 mg pm - Ted hose - Echo results pending - HHPT/OT  Chronic kidney disease stage III Patient's creatinine at baseline 1.3-1.4.  Normocytic anemia Hemoglobin is stable at 9.7. Likely anemia of chronic disease.   Hypertension Takes lisinopril at home. Blood pressure stable. - Continue home Lisinopril 20mg    Dispo: Anticipated discharge today.  Zada Finders, MD 07/23/2016, 11:04 AM  Medicine attending discharge note: I personally examined this  patient on the day of discharge and I concur with the evaluation and discharge plan as recorded in the final progress note by resident physician Dr. Zada Finders above.  Clinical summary: Pleasant 83 year old woman with chronic biventricular systolic heart failure, secondary pulmonary artery hypertension, sick sinus syndrome status post permanent pacemaker placement, chronic atrial fibrillation not on anticoagulation due to high fall risk, and cerebrovascular disease status post prior stroke.  She has chronic peripheral edema with some component of chronic venous insufficiency.  She was brought to the hospital by her family on the day of the current admission for significant increase in lower extremity edema above her baseline.  No cardio respiratory symptoms that the patient would admit to but family felt she was having more trouble breathing.  Chest x-ray showed no overt failure.  She has a large heart in a water bottle configuration.  Most recent echo showed a moderate sized pericardial effusion.  She has chronic, dramatic, distention of her jugular veins to the angle of her jaw.  She had a regular cardiac rhythm.  Some examiners appreciated a systolic murmur.  EKG showed no acute change.  Paced rhythm.  Troponin point-of-care 0.01. She was 10 pounds over her dry weight.  She was treated with parenteral furosemide and had a rapid diuresis.  On day of discharge, edema had resolved.  Weight 120 pounds down from 126.5 on admission A follow-up echocardiogram done on the day of discharge April 7 shows stable  left ventricular ejection fraction 45-50%.  Diffuse hypokinesis.  Right ventricle was severely dilated with reduced right ventricular systolic function, severe dilation of the right atrium, moderate tricuspid regurgitation, and persistent elevation of pulmonary artery pressure 51 mm.  Persistent moderate size pericardial effusion with no signs of tamponade.  No significant change compared with the March 18, 2016 study.  Disposition: Condition stable at time of discharge She will continue to follow-up in our general medicine clinic There were no complications.

## 2016-07-23 NOTE — Progress Notes (Signed)
Patient discharged to home via wheelchair.  Tele box removed.  CCMD notified.  No IV access.  Discharge instructions reviewed and family denies further questions at this time. A.Airen Stiehl, RN

## 2016-07-25 ENCOUNTER — Other Ambulatory Visit: Payer: Self-pay | Admitting: *Deleted

## 2016-07-25 NOTE — Patient Outreach (Signed)
[facility]) Care Management  07/25/2016  ASHTEN SARNOWSKI Apr 29, 1925 [id number]   Member discharged on 4/7 after being hospitalized for heart failure.  Call placed to initiate new transition of care program, no answer.  HIPAA compliant voice message left, will await call back.  Call then placed to member's daughter, [name].  She state that the member has been doing well since discharge, legs remain less edematous.  She denies any medication changes, state she is on her way to the member's home to check on her at this time.  Home visit confirmed for this week, will complete transition of care assessment at that time.  [name], [place], MSN [place] [phone]

## 2016-07-26 DIAGNOSIS — M6281 Muscle weakness (generalized): Secondary | ICD-10-CM | POA: Diagnosis not present

## 2016-07-26 DIAGNOSIS — M810 Age-related osteoporosis without current pathological fracture: Secondary | ICD-10-CM | POA: Diagnosis not present

## 2016-07-26 DIAGNOSIS — I130 Hypertensive heart and chronic kidney disease with heart failure and stage 1 through stage 4 chronic kidney disease, or unspecified chronic kidney disease: Secondary | ICD-10-CM | POA: Diagnosis not present

## 2016-07-26 DIAGNOSIS — M1990 Unspecified osteoarthritis, unspecified site: Secondary | ICD-10-CM | POA: Diagnosis not present

## 2016-07-26 DIAGNOSIS — F0390 Unspecified dementia without behavioral disturbance: Secondary | ICD-10-CM | POA: Diagnosis not present

## 2016-07-26 DIAGNOSIS — I4891 Unspecified atrial fibrillation: Secondary | ICD-10-CM | POA: Diagnosis not present

## 2016-07-26 DIAGNOSIS — E440 Moderate protein-calorie malnutrition: Secondary | ICD-10-CM | POA: Diagnosis not present

## 2016-07-26 DIAGNOSIS — N183 Chronic kidney disease, stage 3 (moderate): Secondary | ICD-10-CM | POA: Diagnosis not present

## 2016-07-26 DIAGNOSIS — I5042 Chronic combined systolic (congestive) and diastolic (congestive) heart failure: Secondary | ICD-10-CM | POA: Diagnosis not present

## 2016-07-28 ENCOUNTER — Other Ambulatory Visit: Payer: Self-pay | Admitting: *Deleted

## 2016-07-28 DIAGNOSIS — N183 Chronic kidney disease, stage 3 (moderate): Secondary | ICD-10-CM | POA: Diagnosis not present

## 2016-07-28 DIAGNOSIS — M810 Age-related osteoporosis without current pathological fracture: Secondary | ICD-10-CM | POA: Diagnosis not present

## 2016-07-28 DIAGNOSIS — M6281 Muscle weakness (generalized): Secondary | ICD-10-CM | POA: Diagnosis not present

## 2016-07-28 DIAGNOSIS — F0390 Unspecified dementia without behavioral disturbance: Secondary | ICD-10-CM | POA: Diagnosis not present

## 2016-07-28 DIAGNOSIS — E440 Moderate protein-calorie malnutrition: Secondary | ICD-10-CM | POA: Diagnosis not present

## 2016-07-28 DIAGNOSIS — M1990 Unspecified osteoarthritis, unspecified site: Secondary | ICD-10-CM | POA: Diagnosis not present

## 2016-07-28 DIAGNOSIS — I130 Hypertensive heart and chronic kidney disease with heart failure and stage 1 through stage 4 chronic kidney disease, or unspecified chronic kidney disease: Secondary | ICD-10-CM | POA: Diagnosis not present

## 2016-07-28 DIAGNOSIS — I4891 Unspecified atrial fibrillation: Secondary | ICD-10-CM | POA: Diagnosis not present

## 2016-07-28 DIAGNOSIS — I5042 Chronic combined systolic (congestive) and diastolic (congestive) heart failure: Secondary | ICD-10-CM | POA: Diagnosis not present

## 2016-07-28 NOTE — Patient Outreach (Signed)
[facility]) Care Management   07/28/2016  [name] 07-14-1925 [id number]  [name] is an 83 y.o. female  Subjective:   Member report that she has continued to have some intermittent abdominal pain as well as some general weakness.  She state she has been compliant with medications, although medications for yesterday remain in pill box. She denies a visit from home health since discharge.  Objective:   Review of Systems  Constitutional: Negative.   HENT: Negative.   Eyes: Negative.   Respiratory: Negative.   Cardiovascular: Positive for leg swelling.  Gastrointestinal: Negative.   Genitourinary: Negative.   Musculoskeletal: Negative.   Skin: Negative.   Neurological: Negative.   Endo/Heme/Allergies: Negative.   Psychiatric/Behavioral: Negative.     Physical Exam  Constitutional: She is oriented to person, place, and time. She appears well-developed and well-nourished.  Neck: Normal range of motion.  Cardiovascular:  Irregular  Respiratory: Effort normal and breath sounds normal.  GI: Soft. Bowel sounds are normal.  Musculoskeletal: Normal range of motion.  Neurological: She is alert and oriented to person, place, and time.  Skin: Skin is warm and dry.   BP (!) 102/52 (BP Location: Right Arm, Patient Position: Sitting, Cuff Size: Normal)   Pulse 82   Resp 20   Ht 1.524 m (5')   Wt 121 lb 9.6 oz (55.2 kg)   SpO2 97%   BMI 23.75 kg/m   Encounter Medications:   Outpatient Encounter Prescriptions as of 07/28/2016  Medication Sig  . aspirin EC 81 MG tablet Take 81 mg by mouth daily.  . diclofenac sodium (VOLTAREN) 1 % GEL Apply 2 g topically 4 (four) times daily. To left shoulder. (Patient taking differently: Apply 2 g topically 4 (four) times daily as needed (for left shoulder pain). )  . docusate sodium (COLACE) 100 MG capsule Take 1 capsule (100 mg total) by mouth daily as needed. (Patient taking differently: Take 100 mg by mouth daily  as needed for mild constipation. )  . furosemide (LASIX) 20 MG tablet Take 2-3 tablets (40-60 mg total) by mouth See admin instructions. 60 mg in the morning and 40 mg in the evening  . lisinopril (PRINIVIL,ZESTRIL) 20 MG tablet TAKE 1 TABLET BY MOUTH DAILY  . loperamide (IMODIUM) 2 MG capsule Take 1 capsule (2 mg total) by mouth as needed for diarrhea or loose stools.  . metoprolol (LOPRESSOR) 100 MG tablet TAKE 1 TABLET(100 MG) BY MOUTH TWICE DAILY  . Nutritional Supplements ([place]) LIQD Take 237 mLs by mouth 3 (three) times daily.  . polyethylene glycol (MIRALAX / GLYCOLAX) packet Take 17 g by mouth daily as needed for mild constipation.  . potassium chloride 20 MEQ/15ML (10%) SOLN TAKE 15 MLS BY MOUTH DAILY  . albuterol (PROVENTIL HFA;VENTOLIN HFA) 108 (90 Base) MCG/ACT inhaler Inhale 2 puffs into the lungs every 6 (six) hours as needed for wheezing or shortness of breath. (Patient not taking: Reported on 07/28/2016)  . dextromethorphan-guaiFENesin (MUCINEX DM) 30-600 MG 12hr tablet Take 1 tablet by mouth 2 (two) times daily. (Patient not taking: Reported on 07/28/2016)   No facility-administered encounter medications on file as of 07/28/2016.     Functional Status:   In your present state of health, do you have any difficulty performing the following activities: 07/21/2016 07/04/2016  Hearing? [place]? Y -  Difficulty concentrating or making decisions? Y -  Walking or climbing stairs? Y -  Dressing or bathing? [name]  Doing errands, shopping? Y Y  Preparing Food and eating ? - -  Using the Toilet? - -  In the past six months, have you accidently leaked urine? - -  Do you have problems with loss of bowel control? - -  Managing your Medications? - -  Managing your Finances? - -  Housekeeping or managing your Housekeeping? - -  Some recent data might be hidden    Fall/Depression Screening:    PHQ 2/9 Scores 07/04/2016 06/20/2016 06/14/2016 05/25/2016 04/28/2016 04/14/2016  02/17/2016  PHQ - 2 Score 0 0 0 0 0 0 0    Assessment:    Met with member at scheduled time, daughter [name] not present.  Member's legs remain swollen, much improved from last visit.  She has new scale, unable to find [facility] calendar with weight log to verify daily weights are complete.  Has not weighed or taken medications for the day.  Assisted with weight and medication administration.  Weight recorded on paper log provided in [facility] with heart failure folder.    Member also provided with heart failure diet handout, re-educated on appropriate heart failure diet.  Although daughter report that she has assisted member with compliance to diet, member has hot dogs, processed red link sausages, and bologna in her refrigerator.  Member report that she will make attempts to decrease the intake of these high sodium foods.   She does not have follow up appointment scheduled, attempted to call to schedule but member request to wait for daughter to allow appointment to me made according to her schedule.  She denies any concerns at this time, advised to contact for questions.   Plan:   Will follow up with [place] regarding involvement and home visits. Will follow up with member next week.  THN CM Care Plan Problem One     Most Recent Value  Care Plan Problem One  Risk for hospital admission related to heart failure management as evidenced by recent admission  Role Documenting the Problem One  Care Management [place] for Problem One  Active  THN Long Term Goal (31-90 days)  Member will not be reamditted to hospital within 31 days of discharge  [facility] Long Term Goal Start Date  07/28/16  Interventions for Problem One Long Term Goal  Reviewed discharge instruction with member and daughter, educated on the importance of following instructions in effort to decrease risk of readmission  THN CM Short Term Goal #1 (0-30 days)  Member will attend follow up visit within the next 2  weeks  THN CM Short Term Goal #1 Start Date  07/28/16  Interventions for Short Term Goal #1  Advised on importance of follow up visit within 2 weeks, advised daughter to call to schedule  THN CM Short Term Goal #2 (0-30 days)  Member will report decrease in sodium intake over the next 4 weeks  THN CM Short Term Goal #2 Start Date  07/28/16  Interventions for Short Term Goal #2  Re-educated on appropriate low sodium diet.  Provided member again with heart failure education (living better with heart failure and heart failure diet fact sheet).    THN CM Care Plan Problem Two     Most Recent Value  Care Plan Problem Two  Risk for hospitalization related to heart failure management as evidenced by leg edema  Role Documenting the Problem Two  Care Management Coordinator  Care Plan for Problem Two  Not Active  Interventions for Problem  Two Long Term Goal   Re-educated member and daughter on interventions to decrease risk of admission (daily weights, diet, exercise, elevating legs, medicatioin compliance, and MD follow up)  THN Long Term Goal (31-90) days  Member will not be admitted to hospital for heart failure related complications within the next 31 days  THN Long Term Goal Start Date  06/23/16 [Original goal met, still at risk for admission, new goal set]  THN Long Term Goal Met Date  -- [goal not met, admitted]  THN CM Short Term Goal #1 (0-30 days)  Member will report wearing compression stockings daily over the next 4 weeks  THN CM Short Term Goal #1 Start Date  05/26/16 [Goal not met, date reset]  THN CM Short Term Goal #1 Met Date   -- [Goal not met]  Interventions for Short Term Goal #2   Re-educted daughter on the importance of member wearing compression stockings to relieve lower extremity swelling.  Also advised of the importance of having stockings of appropriate size that member will be able to apply independently.  THN CM Short Term Goal #2 (0-30 days)  Member will weigh self daily and  record readings over the next 4 weeks  THN CM Short Term Goal #2 Start Date  06/23/16 [Goal not met, date reset]  THN CM Short Term Goal #2 Met Date  07/28/16  Interventions for Short Term Goal #2  Re-educated member and daughter on the importance of daily weights and its relation to monitoring fluid status.  Request made for [facility] to purchase new scale.       [name], [place], MSN [place] [phone]

## 2016-08-02 DIAGNOSIS — I5042 Chronic combined systolic (congestive) and diastolic (congestive) heart failure: Secondary | ICD-10-CM | POA: Diagnosis not present

## 2016-08-02 DIAGNOSIS — M810 Age-related osteoporosis without current pathological fracture: Secondary | ICD-10-CM | POA: Diagnosis not present

## 2016-08-02 DIAGNOSIS — F0390 Unspecified dementia without behavioral disturbance: Secondary | ICD-10-CM | POA: Diagnosis not present

## 2016-08-02 DIAGNOSIS — E440 Moderate protein-calorie malnutrition: Secondary | ICD-10-CM | POA: Diagnosis not present

## 2016-08-02 DIAGNOSIS — M6281 Muscle weakness (generalized): Secondary | ICD-10-CM | POA: Diagnosis not present

## 2016-08-02 DIAGNOSIS — I130 Hypertensive heart and chronic kidney disease with heart failure and stage 1 through stage 4 chronic kidney disease, or unspecified chronic kidney disease: Secondary | ICD-10-CM | POA: Diagnosis not present

## 2016-08-02 DIAGNOSIS — I4891 Unspecified atrial fibrillation: Secondary | ICD-10-CM | POA: Diagnosis not present

## 2016-08-02 DIAGNOSIS — N183 Chronic kidney disease, stage 3 (moderate): Secondary | ICD-10-CM | POA: Diagnosis not present

## 2016-08-02 DIAGNOSIS — M1990 Unspecified osteoarthritis, unspecified site: Secondary | ICD-10-CM | POA: Diagnosis not present

## 2016-08-04 ENCOUNTER — Other Ambulatory Visit: Payer: Self-pay | Admitting: *Deleted

## 2016-08-04 NOTE — Patient Outreach (Signed)
[facility]) Care Management  08/04/2016  JENNET SCROGGIN 03-07-1926 [id number]   Weekly transition of care call placed to member.  She report that she is doing "alright" but does not feel well today.  She state that she is still having problems with her abdomen, especially after she eat.  Difficult to communicate with today, hard of hearing.  Call then placed to daughter, [name], to obtain more information.  Daughter report that member has been improving, weight and blood remaining stable.  She report weight today at 120 pounds, state swelling in legs remain down.  She confirms that home health nursing and physical therapy are involved.  She is made aware that the member continues to complain of abdominal pain.  Member has not had follow up appointment since discharge, reminded daughter to schedule as soon as possible, also advised to discuss abdominal pain and potential visit to GI specialist.  Daughter re-educated on proper low sodium diet as member still had foods high in sodium in refrigerator during last visit.  She verbalize understanding of diet, state that cleaned out her fridge yesterday, those foods (bologna, sausages, and hot dogs) no longer available for member.  Report medication compliance.  She denies any concerns, advised to contact with questions.  Will follow up next week.  [name], [place], MSN [place] [phone]

## 2016-08-08 ENCOUNTER — Other Ambulatory Visit: Payer: Self-pay | Admitting: Internal Medicine

## 2016-08-08 DIAGNOSIS — M6281 Muscle weakness (generalized): Secondary | ICD-10-CM | POA: Diagnosis not present

## 2016-08-08 DIAGNOSIS — F0390 Unspecified dementia without behavioral disturbance: Secondary | ICD-10-CM | POA: Diagnosis not present

## 2016-08-08 DIAGNOSIS — I4891 Unspecified atrial fibrillation: Secondary | ICD-10-CM | POA: Diagnosis not present

## 2016-08-08 DIAGNOSIS — I130 Hypertensive heart and chronic kidney disease with heart failure and stage 1 through stage 4 chronic kidney disease, or unspecified chronic kidney disease: Secondary | ICD-10-CM | POA: Diagnosis not present

## 2016-08-08 DIAGNOSIS — E440 Moderate protein-calorie malnutrition: Secondary | ICD-10-CM | POA: Diagnosis not present

## 2016-08-08 DIAGNOSIS — M810 Age-related osteoporosis without current pathological fracture: Secondary | ICD-10-CM | POA: Diagnosis not present

## 2016-08-08 DIAGNOSIS — M1990 Unspecified osteoarthritis, unspecified site: Secondary | ICD-10-CM | POA: Diagnosis not present

## 2016-08-08 DIAGNOSIS — N183 Chronic kidney disease, stage 3 (moderate): Secondary | ICD-10-CM | POA: Diagnosis not present

## 2016-08-08 DIAGNOSIS — I5042 Chronic combined systolic (congestive) and diastolic (congestive) heart failure: Secondary | ICD-10-CM | POA: Diagnosis not present

## 2016-08-09 ENCOUNTER — Telehealth: Payer: Self-pay | Admitting: Internal Medicine

## 2016-08-09 NOTE — Telephone Encounter (Signed)
APT. REMINDER CALL, LMTCB °

## 2016-08-10 ENCOUNTER — Ambulatory Visit (INDEPENDENT_AMBULATORY_CARE_PROVIDER_SITE_OTHER): Payer: Medicare HMO | Admitting: Internal Medicine

## 2016-08-10 ENCOUNTER — Encounter: Payer: Self-pay | Admitting: Internal Medicine

## 2016-08-10 VITALS — BP 128/65 | HR 71 | Temp 98.2°F | Wt 128.5 lb

## 2016-08-10 DIAGNOSIS — Q6102 Congenital multiple renal cysts: Secondary | ICD-10-CM

## 2016-08-10 DIAGNOSIS — K5790 Diverticulosis of intestine, part unspecified, without perforation or abscess without bleeding: Secondary | ICD-10-CM | POA: Diagnosis not present

## 2016-08-10 DIAGNOSIS — I4891 Unspecified atrial fibrillation: Secondary | ICD-10-CM | POA: Diagnosis not present

## 2016-08-10 DIAGNOSIS — J301 Allergic rhinitis due to pollen: Secondary | ICD-10-CM

## 2016-08-10 DIAGNOSIS — J309 Allergic rhinitis, unspecified: Secondary | ICD-10-CM | POA: Diagnosis not present

## 2016-08-10 DIAGNOSIS — K7689 Other specified diseases of liver: Secondary | ICD-10-CM

## 2016-08-10 DIAGNOSIS — R1012 Left upper quadrant pain: Secondary | ICD-10-CM | POA: Diagnosis not present

## 2016-08-10 DIAGNOSIS — R109 Unspecified abdominal pain: Secondary | ICD-10-CM

## 2016-08-10 DIAGNOSIS — Z5189 Encounter for other specified aftercare: Secondary | ICD-10-CM | POA: Diagnosis not present

## 2016-08-10 DIAGNOSIS — I5032 Chronic diastolic (congestive) heart failure: Secondary | ICD-10-CM

## 2016-08-10 MED ORDER — PANTOPRAZOLE SODIUM 40 MG PO TBEC: 40.0000 mg | DELAYED_RELEASE_TABLET | Freq: Every day | ORAL | 2 refills | Status: AC

## 2016-08-10 MED ORDER — LORATADINE 5 MG PO CHEW
5.0000 mg | CHEWABLE_TABLET | Freq: Every day | ORAL | 1 refills | Status: AC
Start: 2016-08-10 — End: ?

## 2016-08-10 NOTE — Patient Instructions (Signed)
General Instructions: - Please pick up compression stockings - Start protonix 40 mg daily to see if this helps your belly pain - Try Claritin 5 mg daily for your allergies. Can also try over the counter cetirizine or allegra, whichever is cheaper   Please bring your medicines with you each time you come to clinic.  Medicines may include prescription medications, over-the-counter medications, herbal remedies, eye drops, vitamins, or other pills.   Progress Toward Treatment Goals:  Treatment Goal 03/04/2015  Hemoglobin A1C -  Blood pressure at goal  Prevent falls -    Self Care Goals & Plans:  Self Care Goal 05/25/2016  Manage my medications take my medicines as prescribed; bring my medications to every visit; refill my medications on time  Monitor my health keep track of my blood pressure  Eat healthy foods eat more vegetables; eat foods that are low in salt; eat baked foods instead of fried foods  Be physically active find an activity I enjoy  Prevent falls have my vision checked; wear appropriate shoes  Meeting treatment goals -    Home Blood Glucose Monitoring 10/29/2012  Check my blood sugar no home glucose monitoring  When to check my blood sugar -     Care Management & Community Referrals:  Referral 06/11/2014  Referrals made for care management support none needed  Referrals made to community resources -

## 2016-08-10 NOTE — Assessment & Plan Note (Signed)
Patient with recent hospitalization for diastolic CHF exacerbation. Her weight today is up 7 lbs from her discharge weight, but she is not having any overload symptoms besides leg swelling which is chronic. Lungs are clear on exam, no S3. Recommended that patient wear compression stockings as much as possible (has some at home) and to elevated legs when able. Will have her continue her current regimen of Lasix 60 mg in AM and 40 mg in PM. Advised to return to clinic if she develops SOB or orthopnea.

## 2016-08-10 NOTE — Assessment & Plan Note (Signed)
Patient with several month hx of left upper quadrant abdominal pain that only occurs after eating. Her pain is not out of proportion on exam which makes mesenteric ischemia less likely, although certainly still on differential. Her pain seems very atypical for GERD but is associated with food. She has no right-sided pain. She had an EGD in 2011 for iron deficiency anemia and GERD and this was normal. Patient's daughter reports her GI doctor wants to do another EGD but they are limited by her comorbidities. She had a recent CT renal stone study on 06/26/16 which showed a nodular liver concerning for cirrhosis, bilateral renal cysts, and diverticuli. No other hepatobiliary, pancreatic, or splenic abnormalities. Will try a trial of Protonix 40 mg daily to see if this helps her pain. If not, could consider work up for chronic mesenteric ischemia as she did have evidence of abdominal aorta atherosclerosis on CT.

## 2016-08-10 NOTE — Progress Notes (Signed)
CC: Hospital follow up for CHF exacerbation  HPI:  [name] is a 83 y.o. woman with PMHx as noted below who presents today for hospital follow up of her CHF.  CHF: She was hospitalized from 4/5-4/7 after presenting with dyspnea on exertion and increased lower extremity edema. Her symptoms improved with IV diuresis. Repeat echo showed a stable EF of 45-50% with diffuse hypokinesis and moderate pericardial effusion. Her discharge weight was 121 lbs. Today her weight is 128 lbs. Patient denies any shortness of breath, chest pain, or orthopnea. Daughter reports the patient's legs are swollen, but much better than when she was admitted to the hospital. Patient has been taking Lasix 60 mg in AM and 40 mg in PM.   AFib: Patient describes getting "flutters" in her chest. These occur a few times a week. She is on Lopressor 100 mg BID. She is not on anticoagulation, stating she did not like the side effect profile of those medicines. She takes aspirin 81 mg daily.   Allergic rhinitis: Patient describes having rhinorrhea and watery eyes for the last few days. She denies any fevers, chills, sinus pain or pressure, or SOB. She has not tried any OTC medications.   Left-sided abdominal pain: Patient describes having a left-sided abdominal pain that has been ongoing for several months. She states the pain only occurs after eating. Pain will start about 15-30 minutes after eating and then last for 30 minutes. She describes the pain as stabbing. Pain eventually resolves on its own. She denies any associated fevers, nausea, vomiting, or diarrhea.   Past Medical History:  Diagnosis Date  . Adenomatous colon polyp    tubular  . Allergic rhinitis   . Anemia    Iron deficiency, does not tolerate po  . Arthritis    "all over my body"  . Atrial fibrillation (HCC)    on coumadin followed by Dr. Elie Confer  . CHF (congestive heart failure) (HCC)    diastolic dysfunction  . Constipation   . Dementia   .  Diverticulosis of colon (without mention of hemorrhage)   . DJD (degenerative joint disease)   . GERD (gastroesophageal reflux disease)   . History of sick sinus syndrome s/p pacemaker Dr. Lovena Le 2000  . History of trichomonal urethritis   . History of vaginal bleeding 05/2005   nagative endometrial biopsy  . Hypertension    well controlled on 3 agents  . Incontinence overflow, stress female    on vesicare  . Onychomycosis   . Osteoporosis   . Pacemaker   . Restrictive lung disease 1996   PFT's showed mild disease  . Stroke [facility]) 2002   R MCA, cardioembolic  . Type II diabetes mellitus (HCC)    diet controlled  . Weight loss, unintentional 2010-2011   8/08-8/10 138-146 lbs, between 8/10-10/11 lost 18 lbs., work-up negative    Review of Systems:   All negative except per HPI  Physical Exam:  Vitals:   08/10/16 1027  BP: 128/65  Pulse: 71  Temp: 98.2 F (36.8 C)  TempSrc: Oral  SpO2: 100%  Weight: 128 lb 8 oz (58.3 kg)   General: Thin elderly woman in NAD HEENT: EOMI, sclera anicteric, no maxillary sinus tenderness, posterior pharynx non-erythematous, mucus membranes moist CV: RRR, 3/6 systolic murmur heard best at RUSB Pulm: CTA bilaterally, breaths non-labored Abd: BS+, soft, mild tenderness in LUQ, no splenomegaly Ext: 1+ pitting peripheral edema bilaterally  Assessment & Plan:   See Encounters Tab for problem  based charting.  Patient discussed with Dr. Evette Doffing

## 2016-08-10 NOTE — Assessment & Plan Note (Signed)
Recommended that patient start an antihistamine such as cetirizine. She will get this OTC.

## 2016-08-10 NOTE — Assessment & Plan Note (Signed)
CHADS-VASc at least 7. Patient has not been started on [facility] in past due to fall risk and patient refusal due to side effect profile of anticoagulants. Discussed risk of stroke with patient (11.2% per year) while she is not on [facility] and patient and daughter voiced understanding. Her fluttering is most likely due to paroxysmal AFib. Will continue Lopressor 100 mg BID for rate control.

## 2016-08-11 ENCOUNTER — Ambulatory Visit: Payer: Medicare HMO | Admitting: *Deleted

## 2016-08-11 ENCOUNTER — Other Ambulatory Visit: Payer: Self-pay | Admitting: *Deleted

## 2016-08-11 NOTE — Progress Notes (Signed)
Internal Medicine Clinic Attending  Case discussed with Dr. Rivet soon after the resident saw the patient.  We reviewed the resident's history and exam and pertinent patient test results.  I agree with the assessment, diagnosis, and plan of care documented in the resident's note.  

## 2016-08-11 NOTE — Patient Outreach (Addendum)
[facility]) Care Management  08/11/2016  ADDYSEN LOUTH [birth year] [id number]   Weekly transition of care call placed to member, no answer.  HIPAA compliant voice message left, will await call back.  If no call back, will follow up next week.    Update @ 1005:  Call received back from daughter.  She report that the member has been doing well, confirms she attended follow up appointment with primary MD yesterday.  Report that member has gained 7 pounds since discharge, but state her legs still look much better than prior to admission.  Denies any dose changes with Lasix as member's blood pressure usually does not respond well to increased doses.  She will attempt to continue to manage with current doses and diet.  Member has continued to have abdominal pain, daughter report per GI specialist member is not candidate for endoscopy or other invasive testing.  She has been placed on Protonix for medical management.  Daughter denies any concerns at this time, will follow up next week.  [name], [place], MSN [place] [phone]

## 2016-08-16 ENCOUNTER — Telehealth: Payer: Self-pay

## 2016-08-16 DIAGNOSIS — F0390 Unspecified dementia without behavioral disturbance: Secondary | ICD-10-CM | POA: Diagnosis not present

## 2016-08-16 DIAGNOSIS — M1990 Unspecified osteoarthritis, unspecified site: Secondary | ICD-10-CM | POA: Diagnosis not present

## 2016-08-16 DIAGNOSIS — M6281 Muscle weakness (generalized): Secondary | ICD-10-CM | POA: Diagnosis not present

## 2016-08-16 DIAGNOSIS — M810 Age-related osteoporosis without current pathological fracture: Secondary | ICD-10-CM | POA: Diagnosis not present

## 2016-08-16 DIAGNOSIS — N183 Chronic kidney disease, stage 3 (moderate): Secondary | ICD-10-CM | POA: Diagnosis not present

## 2016-08-16 DIAGNOSIS — E440 Moderate protein-calorie malnutrition: Secondary | ICD-10-CM | POA: Diagnosis not present

## 2016-08-16 DIAGNOSIS — I130 Hypertensive heart and chronic kidney disease with heart failure and stage 1 through stage 4 chronic kidney disease, or unspecified chronic kidney disease: Secondary | ICD-10-CM | POA: Diagnosis not present

## 2016-08-16 DIAGNOSIS — I5042 Chronic combined systolic (congestive) and diastolic (congestive) heart failure: Secondary | ICD-10-CM | POA: Diagnosis not present

## 2016-08-16 DIAGNOSIS — I4891 Unspecified atrial fibrillation: Secondary | ICD-10-CM | POA: Diagnosis not present

## 2016-08-16 NOTE — Telephone Encounter (Signed)
VO HHN for medication management bLswelling, education and management of disease process 2x week for 5 weeks also would like HHcsw for eval for community services available, do you agree

## 2016-08-16 NOTE — Telephone Encounter (Signed)
Princess from [employer] home health requesting VO. Please call back.

## 2016-08-17 ENCOUNTER — Other Ambulatory Visit: Payer: Self-pay | Admitting: *Deleted

## 2016-08-17 NOTE — Patient Outreach (Signed)
[facility]) Care Management  08/17/2016  FYNN ADEL 1925-05-16 [id number]   Weekly transition of care call placed to member, no answer.  HIPAA compliant voice message left.  Will follow up next week if no call back.  [name], [place], MSN [place] [phone]

## 2016-08-17 NOTE — Telephone Encounter (Signed)
Agree with plan 

## 2016-08-23 DIAGNOSIS — N183 Chronic kidney disease, stage 3 (moderate): Secondary | ICD-10-CM | POA: Diagnosis not present

## 2016-08-23 DIAGNOSIS — M810 Age-related osteoporosis without current pathological fracture: Secondary | ICD-10-CM | POA: Diagnosis not present

## 2016-08-23 DIAGNOSIS — I4891 Unspecified atrial fibrillation: Secondary | ICD-10-CM | POA: Diagnosis not present

## 2016-08-23 DIAGNOSIS — M6281 Muscle weakness (generalized): Secondary | ICD-10-CM | POA: Diagnosis not present

## 2016-08-23 DIAGNOSIS — F0390 Unspecified dementia without behavioral disturbance: Secondary | ICD-10-CM | POA: Diagnosis not present

## 2016-08-23 DIAGNOSIS — I130 Hypertensive heart and chronic kidney disease with heart failure and stage 1 through stage 4 chronic kidney disease, or unspecified chronic kidney disease: Secondary | ICD-10-CM | POA: Diagnosis not present

## 2016-08-23 DIAGNOSIS — M1990 Unspecified osteoarthritis, unspecified site: Secondary | ICD-10-CM | POA: Diagnosis not present

## 2016-08-23 DIAGNOSIS — E440 Moderate protein-calorie malnutrition: Secondary | ICD-10-CM | POA: Diagnosis not present

## 2016-08-23 DIAGNOSIS — I5042 Chronic combined systolic (congestive) and diastolic (congestive) heart failure: Secondary | ICD-10-CM | POA: Diagnosis not present

## 2016-08-24 DIAGNOSIS — I130 Hypertensive heart and chronic kidney disease with heart failure and stage 1 through stage 4 chronic kidney disease, or unspecified chronic kidney disease: Secondary | ICD-10-CM | POA: Diagnosis not present

## 2016-08-24 DIAGNOSIS — E440 Moderate protein-calorie malnutrition: Secondary | ICD-10-CM | POA: Diagnosis not present

## 2016-08-24 DIAGNOSIS — M1990 Unspecified osteoarthritis, unspecified site: Secondary | ICD-10-CM | POA: Diagnosis not present

## 2016-08-24 DIAGNOSIS — I4891 Unspecified atrial fibrillation: Secondary | ICD-10-CM | POA: Diagnosis not present

## 2016-08-24 DIAGNOSIS — N183 Chronic kidney disease, stage 3 (moderate): Secondary | ICD-10-CM | POA: Diagnosis not present

## 2016-08-24 DIAGNOSIS — M810 Age-related osteoporosis without current pathological fracture: Secondary | ICD-10-CM | POA: Diagnosis not present

## 2016-08-24 DIAGNOSIS — I5042 Chronic combined systolic (congestive) and diastolic (congestive) heart failure: Secondary | ICD-10-CM | POA: Diagnosis not present

## 2016-08-24 DIAGNOSIS — F0390 Unspecified dementia without behavioral disturbance: Secondary | ICD-10-CM | POA: Diagnosis not present

## 2016-08-24 DIAGNOSIS — M6281 Muscle weakness (generalized): Secondary | ICD-10-CM | POA: Diagnosis not present

## 2016-08-25 ENCOUNTER — Other Ambulatory Visit: Payer: Self-pay | Admitting: *Deleted

## 2016-08-25 NOTE — Patient Outreach (Signed)
[facility]) Care Management   08/25/2016  [name] [birth year] [id number]  [name] is an 83 y.o. female  Subjective:   Member alert & oriented x3, denies shortness of breath or chest discomfort, denies pain.  She report compliance with medications.    Objective:   Review of Systems  Constitutional: Negative.   HENT: Negative.   Eyes: Negative.   Respiratory: Negative.   Cardiovascular: Positive for leg swelling.  Gastrointestinal: Negative.   Genitourinary: Negative.   Musculoskeletal: Negative.   Skin: Negative.   Neurological: Negative.   Endo/Heme/Allergies: Negative.   Psychiatric/Behavioral: Negative.     Physical Exam  Constitutional: She is oriented to person, place, and time. She appears well-developed and well-nourished.  Neck: Normal range of motion.  Cardiovascular: Normal rate.   Irregular  Respiratory: Effort normal and breath sounds normal.  GI: Soft. Bowel sounds are normal.  Musculoskeletal: Normal range of motion.  Neurological: She is alert and oriented to person, place, and time.  Skin: Skin is dry.   BP 106/68 (BP Location: Right Arm, Patient Position: Sitting, Cuff Size: Normal)   Pulse 70   Resp 18   Wt 129 lb 3.2 oz (58.6 kg)   SpO2 97%   BMI 25.23 kg/m   Encounter Medications:   Outpatient Encounter Prescriptions as of 08/25/2016  Medication Sig  . aspirin EC 81 MG tablet Take 81 mg by mouth daily.  . diclofenac sodium (VOLTAREN) 1 % GEL Apply 2 g topically 4 (four) times daily. To left shoulder. (Patient taking differently: Apply 2 g topically 4 (four) times daily as needed (for left shoulder pain). )  . docusate sodium (COLACE) 100 MG capsule Take 1 capsule (100 mg total) by mouth daily as needed. (Patient taking differently: Take 100 mg by mouth daily as needed for mild constipation. )  . furosemide (LASIX) 20 MG tablet Take 2-3 tablets (40-60 mg total) by mouth See admin instructions. 60 mg in the  morning and 40 mg in the evening  . lisinopril (PRINIVIL,ZESTRIL) 20 MG tablet TAKE 1 TABLET BY MOUTH DAILY  . loperamide (IMODIUM) 2 MG capsule Take 1 capsule (2 mg total) by mouth as needed for diarrhea or loose stools.  Marland Kitchen loratadine (CLARITIN) 5 MG chewable tablet Chew 1 tablet (5 mg total) by mouth daily.  . metoprolol (LOPRESSOR) 100 MG tablet TAKE 1 TABLET(100 MG) BY MOUTH TWICE DAILY  . Nutritional Supplements ([place]) LIQD Take 237 mLs by mouth 3 (three) times daily.  . pantoprazole (PROTONIX) 40 MG tablet Take 1 tablet (40 mg total) by mouth daily.  . polyethylene glycol (MIRALAX / GLYCOLAX) packet Take 17 g by mouth daily as needed for mild constipation.  . potassium chloride 20 MEQ/15ML (10%) SOLN TAKE 15 MLS BY MOUTH DAILY  . albuterol (PROVENTIL HFA;VENTOLIN HFA) 108 (90 Base) MCG/ACT inhaler Inhale 2 puffs into the lungs every 6 (six) hours as needed for wheezing or shortness of breath. (Patient not taking: Reported on 07/28/2016)  . dextromethorphan-guaiFENesin (MUCINEX DM) 30-600 MG 12hr tablet Take 1 tablet by mouth 2 (two) times daily. (Patient not taking: Reported on 07/28/2016)   No facility-administered encounter medications on file as of 08/25/2016.     Functional Status:   In your present state of health, do you have any difficulty performing the following activities: 08/10/2016 07/21/2016  Hearing? Tempie Donning  Vision? Y Y  Difficulty concentrating or making decisions? Tempie Donning  Walking or climbing stairs? Tempie Donning  Dressing or bathing? Y Y  Doing errands, shopping? Y Y  Preparing Food and eating ? - -  Using the Toilet? - -  In the past six months, have you accidently leaked urine? - -  Do you have problems with loss of bowel control? - -  Managing your Medications? - -  Managing your Finances? - -  Housekeeping or managing your Housekeeping? - -  Some recent data might be hidden    Fall/Depression Screening:    Fall Risk  08/10/2016 07/04/2016 06/20/2016  Falls in  the past year? Yes Yes Yes  Number falls in past yr: 2 or more 2 or more 2 or more  Injury with Fall? Yes Yes Yes  Risk Factor Category  High Fall Risk High Fall Risk High Fall Risk  Risk for fall due to : History of fall(s);Impaired vision;Medication side effect;Impaired balance/gait History of fall(s) History of fall(s)  Risk for fall due to (comments): - - -  Follow up Falls prevention discussed - -   PHQ 2/9 Scores 08/10/2016 07/04/2016 06/20/2016 06/14/2016 05/25/2016 04/28/2016 04/14/2016  PHQ - 2 Score 0 0 0 0 0 0 0    Assessment:    Met with member at scheduled time.  She presents at the kitchen table, states "I'm cooking me something to eat.....som ribs."  Food in refrigerator examined as member had food high in sodium during last visit and daughter reported that she would go discard of high sodium foods.  Member now has more foods that are high in sodium: hot dogs, ham, pork back, sausages.  Discussed with member (daughter not present at this time) the importance of adhering to low sodium diet, pointing out particular unhealthy foods.  She state "well I'm gonna eat it."  She report compliance with medications, pill pocket for today empty.  Legs are more edematous today than last visit.  Weight today 129 pounds, gradual increase from 5/1.  (5/1 - 122, 5/3 - 124, 5/4 - 125, 5/5 - 129, 5/6 - 128).  She denies shortness of breath.    Daughter not available today, member state that she spent the night with her last night but has now gone home.  Daughter is more of an accurate historian, also assists member with grocery shopping and meal preparations at times.  This care manager has discussed proper low sodium diet with daughter, she has verbalized understanding, stating she will monitor member's intake and grocery shopping more closely.  This care manager will contact daughter again to discuss member's current status (weight gain and leg swelling) and more adequate heart failure management.    Member  denies any questions at this time, unable to verify if home health services are complete (she was receiving nursing and physical therapy).  Will follow up with daughter.  Plan:   Will contact daughter to discuss ongoing plan of care. Will follow up next month with routine home visit.  THN CM Care Plan Problem One     Most Recent Value  Care Plan Problem One  Risk for hospital admission related to heart failure management as evidenced by recent admission  Role Documenting the Problem One  Care Management [place] for Problem One  Not Active  THN Long Term Goal (31-90 days)  Member will not be reamditted to hospital within 31 days of discharge  [facility] Long Term Goal Start Date  07/28/16  [facility] Long Term Goal Met Date  08/25/16  Interventions for Problem One Long Term Goal  Reviewed discharge instruction with member and daughter, educated on the importance of following instructions in effort to decrease risk of readmission  THN CM Short Term Goal #1 (0-30 days)  Member will attend follow up visit within the next 2 weeks  THN CM Short Term Goal #1 Start Date  07/28/16  [facility] CM Short Term Goal #1 Met Date  08/11/16  Interventions for Short Term Goal #1  Advised on importance of follow up visit within 2 weeks, reminded daughter to call to schedule  THN CM Short Term Goal #2 (0-30 days)  Member will report decrease in sodium intake over the next 4 weeks  THN CM Short Term Goal #2 Start Date  07/28/16  THN CM Short Term Goal #2 Met Date  -- [not met]  Interventions for Short Term Goal #2  Re-educated on appropriate low sodium diet.  Provided member again with heart failure education (living better with heart failure and heart failure diet fact sheet).     [name], [place], MSN [place] [phone]

## 2016-08-26 DIAGNOSIS — N183 Chronic kidney disease, stage 3 (moderate): Secondary | ICD-10-CM | POA: Diagnosis not present

## 2016-08-26 DIAGNOSIS — I4891 Unspecified atrial fibrillation: Secondary | ICD-10-CM | POA: Diagnosis not present

## 2016-08-26 DIAGNOSIS — E440 Moderate protein-calorie malnutrition: Secondary | ICD-10-CM | POA: Diagnosis not present

## 2016-08-26 DIAGNOSIS — I5042 Chronic combined systolic (congestive) and diastolic (congestive) heart failure: Secondary | ICD-10-CM | POA: Diagnosis not present

## 2016-08-26 DIAGNOSIS — F0390 Unspecified dementia without behavioral disturbance: Secondary | ICD-10-CM | POA: Diagnosis not present

## 2016-08-26 DIAGNOSIS — M810 Age-related osteoporosis without current pathological fracture: Secondary | ICD-10-CM | POA: Diagnosis not present

## 2016-08-26 DIAGNOSIS — M1990 Unspecified osteoarthritis, unspecified site: Secondary | ICD-10-CM | POA: Diagnosis not present

## 2016-08-26 DIAGNOSIS — M6281 Muscle weakness (generalized): Secondary | ICD-10-CM | POA: Diagnosis not present

## 2016-08-26 DIAGNOSIS — I130 Hypertensive heart and chronic kidney disease with heart failure and stage 1 through stage 4 chronic kidney disease, or unspecified chronic kidney disease: Secondary | ICD-10-CM | POA: Diagnosis not present

## 2016-08-31 DIAGNOSIS — I5042 Chronic combined systolic (congestive) and diastolic (congestive) heart failure: Secondary | ICD-10-CM | POA: Diagnosis not present

## 2016-08-31 DIAGNOSIS — N183 Chronic kidney disease, stage 3 (moderate): Secondary | ICD-10-CM | POA: Diagnosis not present

## 2016-08-31 DIAGNOSIS — E440 Moderate protein-calorie malnutrition: Secondary | ICD-10-CM | POA: Diagnosis not present

## 2016-08-31 DIAGNOSIS — F0390 Unspecified dementia without behavioral disturbance: Secondary | ICD-10-CM | POA: Diagnosis not present

## 2016-08-31 DIAGNOSIS — M810 Age-related osteoporosis without current pathological fracture: Secondary | ICD-10-CM | POA: Diagnosis not present

## 2016-08-31 DIAGNOSIS — I130 Hypertensive heart and chronic kidney disease with heart failure and stage 1 through stage 4 chronic kidney disease, or unspecified chronic kidney disease: Secondary | ICD-10-CM | POA: Diagnosis not present

## 2016-08-31 DIAGNOSIS — I4891 Unspecified atrial fibrillation: Secondary | ICD-10-CM | POA: Diagnosis not present

## 2016-08-31 DIAGNOSIS — M1990 Unspecified osteoarthritis, unspecified site: Secondary | ICD-10-CM | POA: Diagnosis not present

## 2016-08-31 DIAGNOSIS — M6281 Muscle weakness (generalized): Secondary | ICD-10-CM | POA: Diagnosis not present

## 2016-09-01 ENCOUNTER — Telehealth: Payer: Self-pay | Admitting: Internal Medicine

## 2016-09-01 ENCOUNTER — Other Ambulatory Visit: Payer: Self-pay | Admitting: *Deleted

## 2016-09-01 NOTE — Telephone Encounter (Signed)
[facility] calls for VO for disease process management, medication management 1xweek for 5 weeks and 2pprn visits. PT 1x week for 1 week for 2 weeks, given VO, do you approve?

## 2016-09-01 NOTE — Telephone Encounter (Signed)
PLEASE [name] [phone] AT [facility], NEED ORDERS FOR PATIENT

## 2016-09-01 NOTE — Telephone Encounter (Signed)
Also HHN would like to know if you desire to set perimeters on BP that [place] is to call office for?

## 2016-09-01 NOTE — Telephone Encounter (Signed)
Lm for rtc 

## 2016-09-01 NOTE — Telephone Encounter (Signed)
Approve.  Thanks.

## 2016-09-02 DIAGNOSIS — E440 Moderate protein-calorie malnutrition: Secondary | ICD-10-CM | POA: Diagnosis not present

## 2016-09-02 DIAGNOSIS — I130 Hypertensive heart and chronic kidney disease with heart failure and stage 1 through stage 4 chronic kidney disease, or unspecified chronic kidney disease: Secondary | ICD-10-CM | POA: Diagnosis not present

## 2016-09-02 DIAGNOSIS — F0390 Unspecified dementia without behavioral disturbance: Secondary | ICD-10-CM | POA: Diagnosis not present

## 2016-09-02 DIAGNOSIS — I5042 Chronic combined systolic (congestive) and diastolic (congestive) heart failure: Secondary | ICD-10-CM | POA: Diagnosis not present

## 2016-09-02 DIAGNOSIS — M1990 Unspecified osteoarthritis, unspecified site: Secondary | ICD-10-CM | POA: Diagnosis not present

## 2016-09-02 DIAGNOSIS — M6281 Muscle weakness (generalized): Secondary | ICD-10-CM | POA: Diagnosis not present

## 2016-09-02 DIAGNOSIS — N183 Chronic kidney disease, stage 3 (moderate): Secondary | ICD-10-CM | POA: Diagnosis not present

## 2016-09-02 DIAGNOSIS — I4891 Unspecified atrial fibrillation: Secondary | ICD-10-CM | POA: Diagnosis not present

## 2016-09-02 DIAGNOSIS — M810 Age-related osteoporosis without current pathological fracture: Secondary | ICD-10-CM | POA: Diagnosis not present

## 2016-09-02 MED ORDER — POTASSIUM CHLORIDE 20 MEQ/15ML (10%) PO SOLN: ORAL | 3 refills | Status: DC

## 2016-09-02 NOTE — Telephone Encounter (Signed)
Parameters -   Low: SBP < 100, DBP < 50 High: SBP > 150, DBP > 100  Thanks

## 2016-09-02 NOTE — Telephone Encounter (Signed)
Called HHN and lm on secure vmail of bp parameters

## 2016-09-06 ENCOUNTER — Telehealth: Payer: Self-pay

## 2016-09-06 NOTE — Telephone Encounter (Signed)
Call made to [facility] with Manus Gunning, verbal authorization given to initiate "pallative care".Despina Hidden Cassady5/22/20183:48 PM

## 2016-09-06 NOTE — Telephone Encounter (Signed)
Manus Gunning from [place] requesting to speak with a nurse about patient. Please call back.

## 2016-09-06 NOTE — Telephone Encounter (Signed)
Returned Fran's call from [facility] - requesting verbal order for "Pallative Care". Told I will inform Dr Daryll Drown and let her know.

## 2016-09-06 NOTE — Telephone Encounter (Signed)
VO to initiate palliative care is given.

## 2016-09-07 ENCOUNTER — Other Ambulatory Visit: Payer: Self-pay | Admitting: *Deleted

## 2016-09-07 NOTE — Patient Outreach (Signed)
[facility]) Care Management  09/07/2016  JAQLYN GRUENHAGEN [birth year] [id number]   Voice message received from [facility] with Palliative Care[facility] stating that she received referral for member and would like to further discuss.  Call placed to Manus Gunning, discussed possibility of member becoming active with [facility].  She is aware that this care manager has not introduced the idea of palliative care at this time, but will contact daughter, [name], to discuss prior to contact.  This care manager will notify Manus Gunning when permission is granted to contact.  Call placed to daughter, [facility] discussed.  Difference between palliative care and hospice explained.  She state that she feels it will be a good program, but will like to discuss with member directly prior to contact.  She will contact this care manager by tomorrow with final decision.  [name], [place], MSN [place] [phone]

## 2016-09-09 ENCOUNTER — Other Ambulatory Visit: Payer: Self-pay | Admitting: *Deleted

## 2016-09-09 NOTE — Patient Outreach (Signed)
[facility]) Care Management  09/09/2016  [name] 05/19/1925 [id number]   Voice message received from daughter, [name], granting permission for [facility] to proceed with referral and contact.  Call placed to daughter to confirm message was received, no answer, HIPAA compliant voice message left.    Call placed to [facility], spoke to [facility], notified that it was ok to proceed with referral.  [name], RN, MSN [place] Manager [phone]

## 2016-09-22 ENCOUNTER — Other Ambulatory Visit: Payer: Self-pay | Admitting: *Deleted

## 2016-09-22 NOTE — Patient Outreach (Signed)
[facility]) Care Management   09/22/2016  KAIDANCE PANTOJA 01/13/1926 [id number]  SHENELLE KLAS is an 83 y.o. female  Subjective:   Member alert and oriented x3, denies pain or discomfort at this time.  She has swelling in her legs, but report they are "a lot better."  She report compliance with medications.  Objective:   Review of Systems  Constitutional: Negative.   HENT: Negative.   Eyes: Negative.   Respiratory: Negative.   Cardiovascular: Positive for leg swelling.  Gastrointestinal: Negative.   Genitourinary: Negative.   Musculoskeletal: Negative.   Skin: Negative.   Neurological: Negative.   Endo/Heme/Allergies: Negative.   Psychiatric/Behavioral: Negative.     Physical Exam  Constitutional: She is oriented to person, place, and time. She appears well-developed and well-nourished.  Neck: Normal range of motion.  Cardiovascular: Normal heart sounds.   Irregular  Respiratory: Effort normal and breath sounds normal.  GI: Soft. Bowel sounds are normal.  Musculoskeletal: Normal range of motion.  Neurological: She is alert and oriented to person, place, and time.  Skin: Skin is warm and dry.   BP 118/64   Pulse 77   Resp 18   Wt 135 lb 6.4 oz (61.4 kg)   SpO2 95%   BMI 26.44 kg/m   Encounter Medications:   Outpatient Encounter Prescriptions as of 09/22/2016  Medication Sig  . aspirin EC 81 MG tablet Take 81 mg by mouth daily.  Marland Kitchen docusate sodium (COLACE) 100 MG capsule Take 1 capsule (100 mg total) by mouth daily as needed. (Patient taking differently: Take 100 mg by mouth daily as needed for mild constipation. )  . furosemide (LASIX) 20 MG tablet Take 2-3 tablets (40-60 mg total) by mouth See admin instructions. 60 mg in the morning and 40 mg in the evening  . lisinopril (PRINIVIL,ZESTRIL) 20 MG tablet TAKE 1 TABLET BY MOUTH DAILY  . loperamide (IMODIUM) 2 MG capsule Take 1 capsule (2 mg total) by mouth as needed for diarrhea or loose stools.   Marland Kitchen loratadine (CLARITIN) 5 MG chewable tablet Chew 1 tablet (5 mg total) by mouth daily.  . metoprolol (LOPRESSOR) 100 MG tablet TAKE 1 TABLET(100 MG) BY MOUTH TWICE DAILY  . Nutritional Supplements ([place]) LIQD Take 237 mLs by mouth 3 (three) times daily.  . pantoprazole (PROTONIX) 40 MG tablet Take 1 tablet (40 mg total) by mouth daily.  . polyethylene glycol (MIRALAX / GLYCOLAX) packet Take 17 g by mouth daily as needed for mild constipation.  . potassium chloride 20 MEQ/15ML (10%) SOLN TAKE 15 MLS BY MOUTH DAILY  . albuterol (PROVENTIL HFA;VENTOLIN HFA) 108 (90 Base) MCG/ACT inhaler Inhale 2 puffs into the lungs every 6 (six) hours as needed for wheezing or shortness of breath. (Patient not taking: Reported on 07/28/2016)  . dextromethorphan-guaiFENesin (MUCINEX DM) 30-600 MG 12hr tablet Take 1 tablet by mouth 2 (two) times daily. (Patient not taking: Reported on 07/28/2016)  . diclofenac sodium (VOLTAREN) 1 % GEL Apply 2 g topically 4 (four) times daily. To left shoulder. (Patient not taking: Reported on 09/22/2016)   No facility-administered encounter medications on file as of 09/22/2016.     Functional Status:   In your present state of health, do you have any difficulty performing the following activities: 08/10/2016 07/21/2016  Hearing? Tempie Donning  Vision? Y Y  Difficulty concentrating or making decisions? Tempie Donning  Walking or climbing stairs? Y Y  Dressing or bathing? Y Y  Doing errands, shopping?  Y Y  Preparing Food and eating ? - -  Using the Toilet? - -  In the past six months, have you accidently leaked urine? - -  Do you have problems with loss of bowel control? - -  Managing your Medications? - -  Managing your Finances? - -  Housekeeping or managing your Housekeeping? - -  Some recent data might be hidden    Fall/Depression Screening:    Fall Risk  08/10/2016 07/04/2016 06/20/2016  Falls in the past year? Yes Yes Yes  Number falls in past yr: 2 or more 2 or more 2 or  more  Injury with Fall? Yes Yes Yes  Risk Factor Category  High Fall Risk High Fall Risk High Fall Risk  Risk for fall due to : History of fall(s);Impaired vision;Medication side effect;Impaired balance/gait History of fall(s) History of fall(s)  Risk for fall due to (comments): - - -  Follow up Falls prevention discussed - -   PHQ 2/9 Scores 08/10/2016 07/04/2016 06/20/2016 06/14/2016 05/25/2016 04/28/2016 04/14/2016  PHQ - 2 Score 0 0 0 0 0 0 0    Assessment:    Met with member at scheduled time.  She is initially hesitant to open door stating that she is not dressed, however agrees to proceed with visit.  She report that her daughter, [name], stayed with her last night, not present at this time.  She does not have any blood pressures or weights written on calender since last home visit.  She does weight today, 135 pounds, up 6 pounds from last month.  He pill box is near full, unsure when it was filled so unable to verify compliance.  Medications for yesterday morning taken with the exception of Furosemide, all other pockets full. She agrees to take medications while this care manager is present, however found holding all Furosemide tablets in hand.  She voices frustration regarding increased urination with medication.  Educated on importance of compliance in effort to manage fluid status/heart failure.  She verbalizes understanding and take this morning's dose.  She continues to remain noncompliant with diet, high sodium foods still found in refrigerator as well as multiple canned foods.  This care manager inquired about contact with [facility].  She state "someone" made visit, unable to verify if it was [facility] or Home health.  Call placed to Manus Gunning, report she has still been unable to contact daughter to schedule assessment.  Update on today's visit provided, aware that this care manager will follow up with daughter and request her to contact them directly.  Call placed to daughter, she  state she has received the messages from [place], returned a call with message earlier this week.  She state she will contact tomorrow to schedule assessment visit.  She also confirm that member has been noncompliant with medications for the same reasons stated above.  She state she has reminded her to take them as well as re-educating the rationale.  She state she will continue to provide assistance and education.    Plan:   Will follow up with member and/or [facility] within 2 weeks regarding involvement.  If involved, will close case at that time.  THN CM Care Plan Problem One     Most Recent Value  Care Plan Problem One  Risk for hospital admission related to heart failure management as evidenced by recent admission  Role Documenting the Problem One  Care Management [place] for Problem One  Not Active  [place] Term Goal   Member will not be reamditted to hospital within 31 days of discharge  THN Long Term Goal Start Date  07/28/16  [facility] Long Term Goal Met Date  08/25/16  Interventions for Problem One Long Term Goal  Reviewed discharge instruction with member and daughter, educated on the importance of following instructions in effort to decrease risk of readmission  THN CM Short Term Goal #1   Member will attend follow up visit within the next 2 weeks  THN CM Short Term Goal #1 Start Date  07/28/16  [facility] CM Short Term Goal #1 Met Date  08/11/16  Interventions for Short Term Goal #1  Advised on importance of follow up visit within 2 weeks, reminded daughter to call to schedule  THN CM Short Term Goal #2   Member will report decrease in sodium intake over the next 4 weeks  THN CM Short Term Goal #2 Start Date  07/28/16  THN CM Short Term Goal #2 Met Date  -- [not met]  Interventions for Short Term Goal #2  Re-educated on appropriate low sodium diet.  Provided member again with heart failure education (living better with heart failure and heart failure diet fact sheet).      [name], [place], MSN [place] [phone]

## 2016-09-29 ENCOUNTER — Other Ambulatory Visit: Payer: Self-pay | Admitting: Internal Medicine

## 2016-10-05 NOTE — Telephone Encounter (Signed)
This was just filled in May as the liquid.  Tablets were discontinued.  Will refuse.  Thanks

## 2016-10-11 ENCOUNTER — Other Ambulatory Visit: Payer: Self-pay | Admitting: Internal Medicine

## 2016-10-12 ENCOUNTER — Encounter: Payer: Self-pay | Admitting: Internal Medicine

## 2016-10-12 ENCOUNTER — Other Ambulatory Visit: Payer: Self-pay | Admitting: Internal Medicine

## 2016-10-12 ENCOUNTER — Ambulatory Visit (INDEPENDENT_AMBULATORY_CARE_PROVIDER_SITE_OTHER): Payer: Medicare HMO | Admitting: Internal Medicine

## 2016-10-12 VITALS — BP 124/56 | HR 72 | Temp 97.7°F | Wt 129.8 lb

## 2016-10-12 DIAGNOSIS — I10 Essential (primary) hypertension: Secondary | ICD-10-CM

## 2016-10-12 DIAGNOSIS — Z79899 Other long term (current) drug therapy: Secondary | ICD-10-CM | POA: Diagnosis not present

## 2016-10-12 DIAGNOSIS — E538 Deficiency of other specified B group vitamins: Secondary | ICD-10-CM | POA: Diagnosis not present

## 2016-10-12 DIAGNOSIS — I130 Hypertensive heart and chronic kidney disease with heart failure and stage 1 through stage 4 chronic kidney disease, or unspecified chronic kidney disease: Secondary | ICD-10-CM

## 2016-10-12 DIAGNOSIS — R4181 Age-related cognitive decline: Secondary | ICD-10-CM

## 2016-10-12 DIAGNOSIS — J309 Allergic rhinitis, unspecified: Secondary | ICD-10-CM | POA: Diagnosis not present

## 2016-10-12 DIAGNOSIS — K6289 Other specified diseases of anus and rectum: Secondary | ICD-10-CM

## 2016-10-12 DIAGNOSIS — E440 Moderate protein-calorie malnutrition: Secondary | ICD-10-CM | POA: Diagnosis not present

## 2016-10-12 DIAGNOSIS — I5042 Chronic combined systolic (congestive) and diastolic (congestive) heart failure: Secondary | ICD-10-CM | POA: Diagnosis not present

## 2016-10-12 DIAGNOSIS — E876 Hypokalemia: Secondary | ICD-10-CM | POA: Diagnosis not present

## 2016-10-12 DIAGNOSIS — M1710 Unilateral primary osteoarthritis, unspecified knee: Secondary | ICD-10-CM | POA: Diagnosis not present

## 2016-10-12 DIAGNOSIS — I5043 Acute on chronic combined systolic (congestive) and diastolic (congestive) heart failure: Secondary | ICD-10-CM

## 2016-10-12 DIAGNOSIS — K629 Disease of anus and rectum, unspecified: Secondary | ICD-10-CM

## 2016-10-12 DIAGNOSIS — M179 Osteoarthritis of knee, unspecified: Secondary | ICD-10-CM | POA: Insufficient documentation

## 2016-10-12 DIAGNOSIS — N183 Chronic kidney disease, stage 3 unspecified: Secondary | ICD-10-CM

## 2016-10-12 DIAGNOSIS — R159 Full incontinence of feces: Secondary | ICD-10-CM | POA: Diagnosis not present

## 2016-10-12 DIAGNOSIS — R739 Hyperglycemia, unspecified: Secondary | ICD-10-CM

## 2016-10-12 DIAGNOSIS — I4891 Unspecified atrial fibrillation: Secondary | ICD-10-CM

## 2016-10-12 DIAGNOSIS — M170 Bilateral primary osteoarthritis of knee: Secondary | ICD-10-CM

## 2016-10-12 DIAGNOSIS — I270 Primary pulmonary hypertension: Secondary | ICD-10-CM

## 2016-10-12 DIAGNOSIS — J301 Allergic rhinitis due to pollen: Secondary | ICD-10-CM

## 2016-10-12 LAB — GLUCOSE, CAPILLARY: GLUCOSE-CAPILLARY: 97 mg/dL (ref 65–99)

## 2016-10-12 LAB — POCT GLYCOSYLATED HEMOGLOBIN (HGB A1C): Hemoglobin A1C: 6

## 2016-10-12 NOTE — Assessment & Plan Note (Addendum)
She is more volume overloaded today, but her weight is down from check earlier this week.  It is not clear that she is taking her medications, and she admits to missing some doses.  She has pitting edema to the knees.  She has SOB which sounds more related to deconditioning as she has no issue lying flat.  She no crackles on exam.  She has had issues with dehydration with higher lasix doses and would not be able to do weight based dosing.   Plan Increase lasix to 80mg  in the AM (she is more consistent with this dose) and 40mg  in PM for 1 week Follow up in 5 days.  CMET today.

## 2016-10-12 NOTE — Assessment & Plan Note (Signed)
Well controlled on lisinopril, metoprolol, lasix.   124/56 today.   Plan Check CMET Continue 3 drug regimen

## 2016-10-12 NOTE — Assessment & Plan Note (Signed)
Taking loratadine with good results.  She had no complaints today.   Plan Continue loratadine.

## 2016-10-12 NOTE — Assessment & Plan Note (Signed)
Last checked in 2015 - normal.   Recheck B12 today, she is not taking supplementation.

## 2016-10-12 NOTE — Patient Instructions (Addendum)
[name] - -   For your swelling, please take 4 tablets (80mg ) of your lasix in the morning and 2 tablets (40mg ) in the evening (2 pm is okay) for one week.   Then, go back to your normal dosing (60mg  in the AM and 40mg  in the PM).    Please come back to the clinic for an [facility] appointment with a resident on Monday 7/2 at any time that is convenient for you.   Please continue all your other medications as prescribed.  I will call you if your labwork is abnormal.   We discussed Eliquis today and there is more information in this packet.   Thank you!

## 2016-10-12 NOTE — Assessment & Plan Note (Addendum)
She has an at Goal HR today, on metoprolol.  She was regular on exam.  Discussion about Eliquis, will re-visit at next visit  Plan Continue metoprolol Follow up with Cardiology

## 2016-10-12 NOTE — Assessment & Plan Note (Signed)
Reviewed GI note.  They did not recommend colonoscopy due to age and comorbidities.  She is note taking imodium as directed so we discussed recommendation to alternate miralax and imodium based on symptoms.  I am not sure that she is accurately taking her PRN medication and may need further assistance.   Plan Continue medication assistance with [facility] Imodium PRN Miralax PRN

## 2016-10-12 NOTE — Assessment & Plan Note (Signed)
She is taking voltaren gel with good results.  She has no complaints today.   Plan Continue voltaren gel.

## 2016-10-12 NOTE — Progress Notes (Signed)
Subjective:    Patient ID: [name], female    DOB: 1925-09-21, 83 y.o.   MRN: [id number]  CC: 2 month follow up for combined CHF  HPI  [name] is an 83yo woman with combined CHF, pHTN by TTE, CKD, Afib (not on AC), constipation, HTN, SSS with PC in place, malnutrition, OA who presents for routine follow up.  Since I last saw her, [name] was admitted for acute on chronic CHF exacerbation in April.  She was seen in hospital follow up in our clinic in April as well.  She has been communicating with the heart failure outreach group through [facility] and has been referred to [place].    Today, [name] reports that her swelling is worsening.  Call from nurse with care connections reported concerns that the patient was not taking her lasix as prescribed.  The patient reports missing her afternoon dose 1-2 times per week and she did not take her lasix this morning.  She further has SOB which is worse with exertion and activity, but no issues with SOB lying flat or waking up gasping for air.  Her weight today is back down to 129.  She has had issues with lasix dosing in the past due to being overdiuresed and underdiuresed both.  Her daughter does not feel she would be able to comply with weight based daily changes to her lasix dose and I agree.   Discussed Afib today.  Her daughter is concerned about anticoagulation given her history of difficult to control INR.  She has heard of DOACs through TV commercials and is concerned about side effects.  We discussed how aspirin is not adequate for stroke prevention due to Afib.  I gave her information to read about Eliquis and we will discuss at next visit.    Patient continues to be concerned with fecal incontinence - has seen GI in 2017 and was advised to take immodium as needed and given recommendations for dietary changes.  I do not think that she has been able to follow this advise.  We discussed alternating imodium and miralax to avoid  both diarrhea and constipation.   Review of Systems  Constitutional: Negative for activity change, fatigue and unexpected weight change.  Respiratory: Positive for shortness of breath. Negative for cough and wheezing.   Cardiovascular: Positive for leg swelling. Negative for chest pain and palpitations.  Gastrointestinal: Positive for constipation and diarrhea. Negative for blood in stool.  Genitourinary: Negative for dysuria and enuresis.  Musculoskeletal: Positive for arthralgias (knee, hands).  Skin:       Chronic skin changes in legs due to volume  Neurological: Negative for dizziness, syncope and weakness.  Psychiatric/Behavioral: Positive for confusion. Negative for behavioral problems and dysphoric mood.       Objective:   Physical Exam  Constitutional:  Elderly woman, NAD  HENT:  Head: Normocephalic and atraumatic.  Eyes: No scleral icterus.  Cardiovascular: Normal rate, regular rhythm and normal heart sounds.   No murmur heard. Pulmonary/Chest: Effort normal and breath sounds normal. No respiratory distress. She has no rales.  Abdominal: Soft. There is no tenderness.  Musculoskeletal: She exhibits edema (2+ to knees, chronic skin changes in the LE.  ). She exhibits no tenderness.  Neurological: She is alert. She exhibits normal muscle tone.  Psychiatric: She has a normal mood and affect. Her behavior is normal.  Vitals reviewed.   CMET, CBC, B12 today.      Assessment & Plan:  RTC on MOnday 7/2 in [facility] for weight check and edema.

## 2016-10-12 NOTE — Assessment & Plan Note (Signed)
She has some issues with memory.  She gets help with medications at home, but still lives independently. Her daughter denies any concerning behavioral issues.   Plan Continue increased assistance at home.

## 2016-10-12 NOTE — Assessment & Plan Note (Signed)
Cr at last check was stable at 1.39.  She has CHF, HTN, Afib and is on chronic diuretics. She reports no change in urinary habits at this time.   Plan Check CMET today Increasing lasix, monitor for worsening K or Cr.

## 2016-10-12 NOTE — Assessment & Plan Note (Signed)
Noted with PAP of 51 on recent TTE.  She has some chronic SOB at baseline.  She is being managed for her CHF.  Further work up is deferred at this time given comorbidities.    Plan Follow up with Cardiology as planned Monitor for worsening symptoms.

## 2016-10-13 ENCOUNTER — Encounter: Payer: Self-pay | Admitting: Internal Medicine

## 2016-10-13 ENCOUNTER — Telehealth: Payer: Self-pay | Admitting: *Deleted

## 2016-10-13 LAB — CMP14 + ANION GAP
ALK PHOS: 98 IU/L (ref 39–117)
ALT: 8 IU/L (ref 0–32)
AST: 19 IU/L (ref 0–40)
Albumin/Globulin Ratio: 1 — ABNORMAL LOW (ref 1.2–2.2)
Albumin: 3.8 g/dL (ref 3.5–4.7)
Anion Gap: 18 mmol/L (ref 10.0–18.0)
BILIRUBIN TOTAL: 1.4 mg/dL — AB (ref 0.0–1.2)
BUN/Creatinine Ratio: 15 (ref 12–28)
BUN: 26 mg/dL (ref 8–27)
CHLORIDE: 101 mmol/L (ref 96–106)
CO2: 23 mmol/L (ref 20–29)
Calcium: 9.3 mg/dL (ref 8.7–10.3)
Creatinine, Ser: 1.71 mg/dL — ABNORMAL HIGH (ref 0.57–1.00)
GFR calc non Af Amer: 26 mL/min/{1.73_m2} — ABNORMAL LOW (ref >59)
GFR, EST AFRICAN AMERICAN: 30 mL/min/{1.73_m2} — AB (ref >59)
GLUCOSE: 106 mg/dL — AB (ref 65–99)
Globulin, Total: 3.8 g/dL (ref 1.5–4.5)
Potassium: 4.1 mmol/L (ref 3.5–5.2)
Sodium: 142 mmol/L (ref 134–144)
TOTAL PROTEIN: 7.6 g/dL (ref 6.0–8.5)

## 2016-10-13 LAB — CBC
HEMOGLOBIN: 9.4 g/dL — AB (ref 11.1–15.9)
Hematocrit: 28.8 % — ABNORMAL LOW (ref 34.0–46.6)
MCH: 30.5 pg (ref 26.6–33.0)
MCHC: 32.6 g/dL (ref 31.5–35.7)
MCV: 94 fL (ref 79–97)
Platelets: 134 10*3/uL — ABNORMAL LOW (ref 150–379)
RBC: 3.08 x10E6/uL — AB (ref 3.77–5.28)
RDW: 17.7 % — ABNORMAL HIGH (ref 12.3–15.4)
WBC: 3 10*3/uL — AB (ref 3.4–10.8)

## 2016-10-13 LAB — VITAMIN B12: Vitamin B-12: 523 pg/mL (ref 232–1245)

## 2016-10-13 NOTE — Telephone Encounter (Signed)
Called pt 's daughter, [name] - letter requested is ready; stated she will come to pick up letter.

## 2016-10-13 NOTE — Telephone Encounter (Signed)
Thank you :)

## 2016-10-14 ENCOUNTER — Other Ambulatory Visit: Payer: Self-pay | Admitting: *Deleted

## 2016-10-14 NOTE — Patient Outreach (Addendum)
[facility]) Care Management  10/14/2016  LASHELL MOFFITT 1925-11-17 [id number]   Call placed to [facility] daughter/caregiver, [name], to follow up on current health status and involvement with [facility].  She report member is doing "alright."  She state she continues to have difficulty managing her heart failure, legs remain swollen and intermittent shortness of breath.  She state that she has been more involved with managing her diet.  [name] report member has been compliant with MD visits, follow up scheduled for next week.  She does confirm that member is now active with [facility].  She is aware that this care manager will close case at this time due to involvement with another care management program.  She verbalizes understanding and expresses gratitude for assistance provided.  Will notify primary MD and care management assistant of case closure.  [name], [place], MSN [place] [phone]

## 2016-10-18 ENCOUNTER — Telehealth: Payer: Self-pay | Admitting: *Deleted

## 2016-10-18 NOTE — Telephone Encounter (Signed)
Pt has an appt 7/5 in [facility].

## 2016-10-18 NOTE — Telephone Encounter (Signed)
-----   Message from Sid Falcon, MD sent at 10/17/2016  3:32 PM EDT ----- [name] was supposed to come in today for weight check and CHF follow up.  Could you please get her or her daughter to schedule this week to be seen?   Thanks!

## 2016-10-20 ENCOUNTER — Ambulatory Visit (INDEPENDENT_AMBULATORY_CARE_PROVIDER_SITE_OTHER): Payer: Medicare HMO | Admitting: Internal Medicine

## 2016-10-20 ENCOUNTER — Ambulatory Visit: Payer: Medicare HMO

## 2016-10-20 DIAGNOSIS — I5042 Chronic combined systolic (congestive) and diastolic (congestive) heart failure: Secondary | ICD-10-CM | POA: Diagnosis not present

## 2016-10-20 DIAGNOSIS — Z79899 Other long term (current) drug therapy: Secondary | ICD-10-CM | POA: Diagnosis not present

## 2016-10-20 NOTE — Patient Instructions (Addendum)
[name],  For your leg swelling, please take 4 tablets (80mg ) of your lasix in the morning and 2 tablets (40mg ) in the evening (2 pm is okay) for one week.   Then, go back to your normal dosing (60mg  in the AM and 40mg  in the PM).    Please come back to the clinic in one week to recheck your leg swelling and repeat blood work.

## 2016-10-20 NOTE — Progress Notes (Signed)
   CC: Follow up on leg swelling  HPI:  [name] is a 83 y.o. with history noted below that presents to the acute care clinic for follow-up on leg swelling. She saw her primary care physician on 6/27 and was instructed to increase her Lasix for 1 week and to follow-up to reassess leg swelling. Patient states that she has been taking Lasix 60 mg in the morning and 40 mg in the evening and this is her usual daily dose of Lasix. She states she has not increased her lasix at any point.  She reports compliance with her other medications. She states there has not been improvement in her leg swelling. She reports shortness of breath when ambulating short periods but this has remained unchanged in the past week. She denies chest pain.  Past Medical History:  Diagnosis Date  . Adenomatous colon polyp    tubular  . Allergic rhinitis   . Anemia    Iron deficiency, does not tolerate po  . Arthritis    "all over my body"  . Atrial fibrillation (HCC)    on coumadin followed by Dr. Elie Duran  . CHF (congestive heart failure) (HCC)    diastolic dysfunction  . Constipation   . Dementia   . Diverticulosis of colon (without mention of hemorrhage)   . DJD (degenerative joint disease)   . GERD (gastroesophageal reflux disease)   . History of sick sinus syndrome s/p pacemaker Dr. Lovena Duran 2000  . History of trichomonal urethritis   . History of vaginal bleeding 05/2005   nagative endometrial biopsy  . Hypertension    well controlled on 3 agents  . Incontinence overflow, stress female    on vesicare  . Onychomycosis   . Osteoporosis   . Pacemaker   . Restrictive lung disease 1996   PFT's showed mild disease  . Stroke [facility]) 2002   R MCA, cardioembolic  . Type II diabetes mellitus (HCC)    diet controlled  . Weight loss, unintentional 2010-2011   8/08-8/10 138-146 lbs, between 8/10-10/11 lost 18 lbs., work-up negative    Review of Systems:  Review of Systems  Constitutional: Negative for  malaise/fatigue.  Respiratory: Positive for shortness of breath. Negative for cough.   Cardiovascular: Positive for leg swelling.  Neurological: Negative for weakness.     Physical Exam:  Vitals:   10/20/16 1108  BP: 118/62  Pulse: 80  Temp: 98.2 F (36.8 C)  TempSrc: Oral  SpO2: 100%  Weight: 132 lb 3.2 oz (60 kg)   Physical Exam  Constitutional: She is well-developed, well-nourished, and in no distress.  Cardiovascular: Normal rate, regular rhythm and normal heart sounds.  Exam reveals no gallop and no friction rub.   No murmur heard. Pulmonary/Chest: Effort normal and breath sounds normal. No respiratory distress. She has no wheezes. She has no rales.  Musculoskeletal:  2+ pitting edema bilaterally in lower extremities    Assessment & Plan:   See encounters tab for problem based medical decision making.   Patient discussed with Dr. Dareen Duran

## 2016-10-21 ENCOUNTER — Other Ambulatory Visit: Payer: Self-pay | Admitting: Internal Medicine

## 2016-10-21 NOTE — Progress Notes (Signed)
Internal Medicine Clinic Attending  Case discussed with Dr. Hoffman at the time of the visit.  We reviewed the resident's history and exam and pertinent patient test results.  I agree with the assessment, diagnosis, and plan of care documented in the resident's note.  

## 2016-10-21 NOTE — Assessment & Plan Note (Signed)
Patient was recently seen by her primary care physician on 6/27 and recommended increasing Lasix to 80 mg in the morning and 40 mg in the evening for 1 week due to acute increase in leg swelling. Unfortunately patient did not do this and presents today with continued bilateral lower extremity edema. She states that the swelling has been stable for the past week. She is up 3lbs since last visit.  She reports shortness of breath when she ambulates but is likely related to deconditioning as no crackles are noted on exam and patient has no orthopnea.  At this point  recommended patient increase her lasix to 80mg  from 60mg  in the morning and continue her regular 40mg  dose in the evening for one week.  After one week she was told to return to her previous dose and follow up in the clinic  Plan -80mg  lasix in am and 40mg  in pm  -follow up in one week -cmet on return visit

## 2016-10-27 ENCOUNTER — Ambulatory Visit: Payer: Medicare HMO

## 2016-10-28 ENCOUNTER — Ambulatory Visit (INDEPENDENT_AMBULATORY_CARE_PROVIDER_SITE_OTHER): Payer: Medicare HMO | Admitting: Internal Medicine

## 2016-10-28 ENCOUNTER — Encounter: Payer: Self-pay | Admitting: Internal Medicine

## 2016-10-28 VITALS — BP 132/58 | HR 64 | Temp 97.7°F | Wt 132.9 lb

## 2016-10-28 DIAGNOSIS — I872 Venous insufficiency (chronic) (peripheral): Secondary | ICD-10-CM

## 2016-10-28 DIAGNOSIS — I5042 Chronic combined systolic (congestive) and diastolic (congestive) heart failure: Secondary | ICD-10-CM

## 2016-10-28 LAB — BASIC METABOLIC PANEL
ANION GAP: 10 (ref 5–15)
BUN: 31 mg/dL — ABNORMAL HIGH (ref 6–20)
CALCIUM: 9.2 mg/dL (ref 8.9–10.3)
CO2: 30 mmol/L (ref 22–32)
CREATININE: 2.18 mg/dL — AB (ref 0.44–1.00)
Chloride: 99 mmol/L — ABNORMAL LOW (ref 101–111)
GFR, EST AFRICAN AMERICAN: 22 mL/min — AB (ref >60)
GFR, EST NON AFRICAN AMERICAN: 19 mL/min — AB (ref >60)
Glucose, Bld: 99 mg/dL (ref 65–99)
Potassium: 3.8 mmol/L (ref 3.5–5.1)
SODIUM: 139 mmol/L (ref 135–145)

## 2016-10-28 NOTE — Assessment & Plan Note (Addendum)
Assessment: Venous insufficiency  Patient recently increased her Lasix from 60 mg in the morning to 80 mg and continued her regular 40 mg in the afternoon. She presents stating that her leg swelling is worse and that she has increased shortness of breath. On exam patient continues to have leg swelling however in my opinion compared to one week ago swelling looks improved. On lung exam she does not have bibasilar crackles, wheezing and effort is normal.  Had patient ambulate with pulse ox and her oxygenation was 93%. At rest it is 98% on room air. At this point I do not think that patient has a CHF exacerbation in fact I think her leg swelling has gone down with the increase of Lasix.  I suspect that her shortness of breath is partially due to deconditioning as lungs were clear on exam and patient denies orthopnea or chest pain. When ambulating however patient became dizzy and was concerned that she may be dehydrated from the increase in Lasix. Orthostatics were obtained and were negative.  Will obtain a Bmet to assess kidney function and to see if the increased dose needs to be reduced.   Plan -bmet - encouraged using compression stockings or ace bandage to help as compression stockings (this is patient's preference)

## 2016-10-28 NOTE — Progress Notes (Signed)
   CC: Bilateral leg swelling  HPI:  [name] is a 83 y.o. with history noted below that presents to the acute care clinic for follow-up on leg swelling. Patient states that she has increased her lasix to 80mg  in the morning and continued with 40mg  in the evening as recommended in previous visit.  She has been doing this for the past week.  Patient states that her leg swelling has gotten worse and traveled up to her knees. Later in the visit she states that perhaps the leg swelling has improved slightly.  She has associated symptoms of shortness of breath. Patient is accompanied by her daughter and daughter states that for the past month she's noticed her mother ambulating slower than usual.  Patient denies orthopnea, chest pain or palpitations.    Past Medical History:  Diagnosis Date  . Adenomatous colon polyp    tubular  . Allergic rhinitis   . Anemia    Iron deficiency, does not tolerate po  . Arthritis    "all over my body"  . Atrial fibrillation (HCC)    on coumadin followed by Dr. Elie Duran  . CHF (congestive heart failure) (HCC)    diastolic dysfunction  . Constipation   . Dementia   . Diverticulosis of colon (without mention of hemorrhage)   . DJD (degenerative joint disease)   . GERD (gastroesophageal reflux disease)   . History of sick sinus syndrome s/p pacemaker Dr. Lovena Duran 2000  . History of trichomonal urethritis   . History of vaginal bleeding 05/2005   nagative endometrial biopsy  . Hypertension    well controlled on 3 agents  . Incontinence overflow, stress female    on vesicare  . Onychomycosis   . Osteoporosis   . Pacemaker   . Restrictive lung disease 1996   PFT's showed mild disease  . Stroke [facility]) 2002   R MCA, cardioembolic  . Type II diabetes mellitus (HCC)    diet controlled  . Weight loss, unintentional 2010-2011   8/08-8/10 138-146 lbs, between 8/10-10/11 lost 18 lbs., work-up negative    Review of Systems:  Review of Systems    Respiratory: Positive for shortness of breath.   Cardiovascular: Negative for chest pain, palpitations and orthopnea.  Gastrointestinal: Negative for nausea and vomiting.  Genitourinary: Positive for frequency.  Neurological: Positive for dizziness. Negative for weakness.     Physical Exam:  Vitals:   10/28/16 1013  BP: (!) 132/58  Pulse: 64  Temp: 97.7 F (36.5 C)  TempSrc: Oral  SpO2: 98%  Weight: 132 lb 14.4 oz (60.3 kg)   Physical Exam  Constitutional: She is well-developed, well-nourished, and in no distress.  Cardiovascular: Normal rate.   Murmur heard. Irregularly Irregular  Pulmonary/Chest: Effort normal and breath sounds normal. No respiratory distress. She has no wheezes. She has no rales.  Musculoskeletal:  2+ pitting edema in lower extremities bilaterally   Skin: Skin is warm and dry.    Assessment & Plan:   See encounters tab for problem based medical decision making.  Patient seen with Dr. Dareen Duran

## 2016-10-28 NOTE — Progress Notes (Signed)
Internal Medicine Clinic Attending  I saw and evaluated the patient.  I personally confirmed the key portions of the history and exam documented by Dr. Hoffman and I reviewed pertinent patient test results.  The assessment, diagnosis, and plan were formulated together and I agree with the documentation in the resident's note.      

## 2016-10-28 NOTE — Patient Instructions (Addendum)
Ms. Rollins,  It was a pleasure seeing you today.   For your leg swelling, please take 4 tablets (80mg ) of your lasix in the morning and 2 tablets (40mg ) in the evening (2 pm is okay) for one week.   Then, go back to your normal dosing (60mg  in the AM and 40mg  in the PM).   Please come back to the clinic in one week to recheck your leg swelling and repeat blood work.

## 2016-11-03 ENCOUNTER — Encounter (HOSPITAL_COMMUNITY): Payer: Self-pay | Admitting: *Deleted

## 2016-11-03 ENCOUNTER — Inpatient Hospital Stay (HOSPITAL_COMMUNITY): Payer: Medicare HMO

## 2016-11-03 ENCOUNTER — Inpatient Hospital Stay (HOSPITAL_COMMUNITY)
Admission: AD | Admit: 2016-11-03 | Discharge: 2016-11-06 | DRG: 291 | Disposition: A | Discharge status: Home Health | Payer: Medicare HMO | Source: Ambulatory Visit | Attending: Internal Medicine | Admitting: Internal Medicine

## 2016-11-03 ENCOUNTER — Encounter: Payer: Self-pay | Admitting: Internal Medicine

## 2016-11-03 ENCOUNTER — Ambulatory Visit (INDEPENDENT_AMBULATORY_CARE_PROVIDER_SITE_OTHER): Payer: Medicare HMO | Admitting: Internal Medicine

## 2016-11-03 VITALS — BP 101/46 | HR 62 | Temp 98.5°F | Wt 134.9 lb

## 2016-11-03 DIAGNOSIS — Z833 Family history of diabetes mellitus: Secondary | ICD-10-CM | POA: Diagnosis not present

## 2016-11-03 DIAGNOSIS — Z950 Presence of cardiac pacemaker: Secondary | ICD-10-CM

## 2016-11-03 DIAGNOSIS — Z8249 Family history of ischemic heart disease and other diseases of the circulatory system: Secondary | ICD-10-CM

## 2016-11-03 DIAGNOSIS — Z79899 Other long term (current) drug therapy: Secondary | ICD-10-CM

## 2016-11-03 DIAGNOSIS — I5032 Chronic diastolic (congestive) heart failure: Secondary | ICD-10-CM

## 2016-11-03 DIAGNOSIS — Z823 Family history of stroke: Secondary | ICD-10-CM

## 2016-11-03 DIAGNOSIS — I5023 Acute on chronic systolic (congestive) heart failure: Secondary | ICD-10-CM | POA: Diagnosis not present

## 2016-11-03 DIAGNOSIS — E1122 Type 2 diabetes mellitus with diabetic chronic kidney disease: Secondary | ICD-10-CM | POA: Diagnosis not present

## 2016-11-03 DIAGNOSIS — N183 Chronic kidney disease, stage 3 unspecified: Secondary | ICD-10-CM | POA: Diagnosis present

## 2016-11-03 DIAGNOSIS — F0390 Unspecified dementia without behavioral disturbance: Secondary | ICD-10-CM | POA: Diagnosis present

## 2016-11-03 DIAGNOSIS — K5900 Constipation, unspecified: Secondary | ICD-10-CM | POA: Diagnosis present

## 2016-11-03 DIAGNOSIS — I10 Essential (primary) hypertension: Secondary | ICD-10-CM | POA: Diagnosis present

## 2016-11-03 DIAGNOSIS — N179 Acute kidney failure, unspecified: Secondary | ICD-10-CM | POA: Diagnosis not present

## 2016-11-03 DIAGNOSIS — I130 Hypertensive heart and chronic kidney disease with heart failure and stage 1 through stage 4 chronic kidney disease, or unspecified chronic kidney disease: Secondary | ICD-10-CM | POA: Diagnosis not present

## 2016-11-03 DIAGNOSIS — Z8673 Personal history of transient ischemic attack (TIA), and cerebral infarction without residual deficits: Secondary | ICD-10-CM

## 2016-11-03 DIAGNOSIS — Z66 Do not resuscitate: Secondary | ICD-10-CM | POA: Diagnosis present

## 2016-11-03 DIAGNOSIS — I5042 Chronic combined systolic (congestive) and diastolic (congestive) heart failure: Secondary | ICD-10-CM | POA: Diagnosis not present

## 2016-11-03 DIAGNOSIS — I5043 Acute on chronic combined systolic (congestive) and diastolic (congestive) heart failure: Secondary | ICD-10-CM | POA: Diagnosis present

## 2016-11-03 DIAGNOSIS — D696 Thrombocytopenia, unspecified: Secondary | ICD-10-CM | POA: Diagnosis present

## 2016-11-03 DIAGNOSIS — E873 Alkalosis: Secondary | ICD-10-CM | POA: Diagnosis present

## 2016-11-03 DIAGNOSIS — K219 Gastro-esophageal reflux disease without esophagitis: Secondary | ICD-10-CM | POA: Diagnosis not present

## 2016-11-03 DIAGNOSIS — I4891 Unspecified atrial fibrillation: Secondary | ICD-10-CM | POA: Diagnosis not present

## 2016-11-03 DIAGNOSIS — R0602 Shortness of breath: Secondary | ICD-10-CM

## 2016-11-03 DIAGNOSIS — Z9181 History of falling: Secondary | ICD-10-CM

## 2016-11-03 DIAGNOSIS — I509 Heart failure, unspecified: Secondary | ICD-10-CM | POA: Diagnosis not present

## 2016-11-03 DIAGNOSIS — Z7982 Long term (current) use of aspirin: Secondary | ICD-10-CM | POA: Diagnosis not present

## 2016-11-03 DIAGNOSIS — M810 Age-related osteoporosis without current pathological fracture: Secondary | ICD-10-CM | POA: Diagnosis present

## 2016-11-03 LAB — GLUCOSE, CAPILLARY
Glucose-Capillary: 154 mg/dL — ABNORMAL HIGH (ref 65–99)
Glucose-Capillary: 190 mg/dL — ABNORMAL HIGH (ref 65–99)

## 2016-11-03 LAB — CBC
HCT: 29 % — ABNORMAL LOW (ref 36.0–46.0)
Hemoglobin: 9.8 g/dL — ABNORMAL LOW (ref 12.0–15.0)
MCH: 30.1 pg (ref 26.0–34.0)
MCHC: 33.8 g/dL (ref 30.0–36.0)
MCV: 89 fL (ref 78.0–100.0)
PLATELETS: 119 10*3/uL — AB (ref 150–400)
RBC: 3.26 MIL/uL — AB (ref 3.87–5.11)
RDW: 16.9 % — ABNORMAL HIGH (ref 11.5–15.5)
WBC: 4.2 10*3/uL (ref 4.0–10.5)

## 2016-11-03 LAB — BASIC METABOLIC PANEL
ANION GAP: 8 (ref 5–15)
BUN: 31 mg/dL — ABNORMAL HIGH (ref 6–20)
CALCIUM: 9 mg/dL (ref 8.9–10.3)
CO2: 28 mmol/L (ref 22–32)
Chloride: 101 mmol/L (ref 101–111)
Creatinine, Ser: 2.19 mg/dL — ABNORMAL HIGH (ref 0.44–1.00)
GFR, EST AFRICAN AMERICAN: 22 mL/min — AB (ref >60)
GFR, EST NON AFRICAN AMERICAN: 19 mL/min — AB (ref >60)
GLUCOSE: 165 mg/dL — AB (ref 65–99)
POTASSIUM: 3.8 mmol/L (ref 3.5–5.1)
Sodium: 137 mmol/L (ref 135–145)

## 2016-11-03 LAB — BRAIN NATRIURETIC PEPTIDE: B NATRIURETIC PEPTIDE 5: 2814.2 pg/mL — AB (ref 0.0–100.0)

## 2016-11-03 MED ORDER — FUROSEMIDE 10 MG/ML IJ SOLN
60.0000 mg | INTRAMUSCULAR | Status: DC
  Administered 2016-11-04: 60 mg via INTRAVENOUS
  Filled 2016-11-03: qty 6

## 2016-11-03 MED ORDER — POLYETHYLENE GLYCOL 3350 17 G PO PACK: 17.0000 g | PACK | Freq: Every day | ORAL | Status: DC | PRN

## 2016-11-03 MED ORDER — ONDANSETRON HCL 4 MG/2ML IJ SOLN: 4.0000 mg | Freq: Four times a day (QID) | INTRAMUSCULAR | Status: DC | PRN

## 2016-11-03 MED ORDER — SODIUM CHLORIDE 0.9% FLUSH: 3.0000 mL | INTRAVENOUS | Status: DC | PRN

## 2016-11-03 MED ORDER — LORATADINE 10 MG PO TABS
5.0000 mg | ORAL_TABLET | Freq: Every day | ORAL | Status: DC
  Administered 2016-11-04 – 2016-11-06 (×3): 5 mg via ORAL
  Filled 2016-11-03 (×3): qty 1

## 2016-11-03 MED ORDER — ACETAMINOPHEN 325 MG PO TABS: 650.0000 mg | ORAL_TABLET | ORAL | Status: DC | PRN

## 2016-11-03 MED ORDER — SODIUM CHLORIDE 0.9 % IV SOLN: 250.0000 mL | INTRAVENOUS | Status: DC | PRN

## 2016-11-03 MED ORDER — PANTOPRAZOLE SODIUM 40 MG PO TBEC
40.0000 mg | DELAYED_RELEASE_TABLET | Freq: Every day | ORAL | Status: DC
  Administered 2016-11-03 – 2016-11-06 (×4): 40 mg via ORAL
  Filled 2016-11-03 (×4): qty 1

## 2016-11-03 MED ORDER — ENSURE ENLIVE PO LIQD
237.0000 mL | Freq: Three times a day (TID) | ORAL | Status: DC
Start: 2016-11-03 — End: 2016-11-06
  Administered 2016-11-03 – 2016-11-06 (×9): 237 mL via ORAL

## 2016-11-03 MED ORDER — ENOXAPARIN SODIUM 30 MG/0.3ML ~~LOC~~ SOLN
30.0000 mg | SUBCUTANEOUS | Status: DC
  Administered 2016-11-03 – 2016-11-05 (×3): 30 mg via SUBCUTANEOUS
  Filled 2016-11-03 (×3): qty 0.3

## 2016-11-03 MED ORDER — SODIUM CHLORIDE 0.9% FLUSH
3.0000 mL | Freq: Two times a day (BID) | INTRAVENOUS | Status: DC
  Administered 2016-11-03 – 2016-11-06 (×6): 3 mL via INTRAVENOUS

## 2016-11-03 MED ORDER — DOCUSATE SODIUM 100 MG PO CAPS: 100.0000 mg | ORAL_CAPSULE | Freq: Every day | ORAL | Status: DC | PRN

## 2016-11-03 MED ORDER — POTASSIUM CHLORIDE CRYS ER 20 MEQ PO TBCR
40.0000 meq | EXTENDED_RELEASE_TABLET | Freq: Every day | ORAL | Status: DC
  Administered 2016-11-04: 40 meq via ORAL
  Filled 2016-11-03: qty 2

## 2016-11-03 MED ORDER — ASPIRIN EC 81 MG PO TBEC
81.0000 mg | DELAYED_RELEASE_TABLET | Freq: Every day | ORAL | Status: DC
  Administered 2016-11-03 – 2016-11-06 (×4): 81 mg via ORAL
  Filled 2016-11-03 (×4): qty 1

## 2016-11-03 NOTE — Assessment & Plan Note (Signed)
This is a chronic issue and worsening.  [name] presented with worsening symptoms of acute CHF.  We have been attempting to manage in the outpatient setting.  Renal function is stable compared to 6 days ago, but much worsened over the last 2 weeks.  She has increased edema, fatigue, orthopnea.  She has had a weight gain of 4 pounds since 6/27 and also increased BNP.  I think she is in an acute CHF exacerbation.  I do not think we will be able to manage this further with oral medications.  She will need to be admitted for 1-2 days for IV therapy and possibly a change to oral home medications.  I discussed this plan with her daughter who agreed.

## 2016-11-03 NOTE — H&P (Signed)
Date: 11/03/2016               Patient Name:  [name] MRN: [id number]  DOB: 1926-03-20 Age / Sex: 83 y.o., female   PCP: Sid Falcon, MD         Medical Service: Internal Medicine Teaching Service         Attending Physician: Dr. Lucious Groves, DO    First Contact: Dr. Maricela Bo Pager: [phone]  Second Contact: Dr. Benjamine Mola Pager: [phone]       After Hours (After 5p/  First Contact Pager: [phone]  weekends / holidays): Second Contact Pager: [phone]   Chief Complaint: Fatigue and leg swelling   History of Present Illness:   [name] is an 83 yo woman with pmh of chf class 2, sick sinus syndrome with pacemaker, age related dementia, and osteoporosis was admitted directly from the [facility] Internal medicine clinic for fatigue and bilateral leg swelling. The patient states that she has been feeling tired for the past week despite eating and sleeping well. She has also had accompanied leg swelling that she noticed was getting worse a week ago. [name] states that she forgot to take her morning medication twice over the past week. According to Dr. Daryll Drown (her pcp) the patient has worsened swelling, increased dyspnea on exertion, orthopnea, and has gained 5 pounds since her last clinic appointment on 6/27. Dr. Daryll Drown also feels that the patient has not been taking her medications and not adhering to her low salt diet for sometime now. The patient is on home lasix 60mg  in the morning and 40mg  in the evening. She has a pill box which her daughter helps her organize and she gets home health services that come to her house. She mentioned that she sometimes forgets to take the pills out of the box.   Ms. Cowens also complained of not feeling like herself lately. She states that she has been seeing things that are not supposed to be there and hearing voices. She has also been having a frontal headache that comes on intermittently for the past week. She says that she has also been  having a tremor lately. She denies any chest pain, palpitations, pain while urinating, or any recent falls.   Meds:  No outpatient prescriptions have been marked as taking for the 11/03/16 encounter [facility] Encounter).    Allergies: Allergies as of 11/03/2016  . (No Known Allergies)   Past Medical History:  Diagnosis Date  . Adenomatous colon polyp    tubular  . Allergic rhinitis   . Anemia    Iron deficiency, does not tolerate po  . Arthritis    "all over my body"  . Atrial fibrillation (HCC)    on coumadin followed by Dr. Elie Confer  . CHF (congestive heart failure) (HCC)    diastolic dysfunction  . Constipation   . Dementia   . Diverticulosis of colon (without mention of hemorrhage)   . DJD (degenerative joint disease)   . GERD (gastroesophageal reflux disease)   . History of sick sinus syndrome s/p pacemaker Dr. Lovena Le 2000  . History of trichomonal urethritis   . History of vaginal bleeding 05/2005   nagative endometrial biopsy  . Hypertension    well controlled on 3 agents  . Incontinence overflow, stress female    on vesicare  . Onychomycosis   . Osteoporosis   . Pacemaker   . Restrictive lung disease 1996   PFT's showed mild disease  .  Stroke [facility]) 2002   R MCA, cardioembolic  . Type II diabetes mellitus (HCC)    diet controlled  . Weight loss, unintentional 2010-2011   8/08-8/10 138-146 lbs, between 8/10-10/11 lost 18 lbs., work-up negative    Family History:  Diabetes-sister Chief [employer] Officer  Social History: Lives alone and gets home help. Daughter comes daily. No tobacco, 1 beer a week  Review of Systems: A complete ROS was negative except as per HPI.   Physical Exam: Blood pressure (!) 112/51, pulse 66, temperature 98 F (36.7 C), temperature source Oral, resp. rate 14, height 5' (1.524 m), weight 133 lb 12.8 oz (60.7 kg), SpO2 98 %.   Physical Exam  Constitutional: She is well-developed, well-nourished, and in no distress.    HENT:  Head: Normocephalic and atraumatic.  Eyes: Scleral icterus is present.  Cardiovascular: Normal rate, regular rhythm, normal heart sounds and intact distal pulses.   Pacemaker present  Pulmonary/Chest: Effort normal. She has decreased breath sounds in the right middle field, the right lower field, the left middle field and the left lower field. She has wheezes in the right upper field and the left upper field.  Musculoskeletal:  2+ pitting edema bilaterally  Neurological:  Slowed, age related cognitive delay  Skin: No rash noted. No erythema.  Psychiatric: Mood, memory, affect and judgment normal.    EKG: pending  CXR: pending  Assessment & Plan by Problem:  Acute CHF exacerbation Bilateral Lower Leg Swelling Fatigue Patient has had longstanding CHF class 2 that is being controlled with lasix 60mg  in the morning and 40mg  in the evening. She has been missing doses and has not been following a low sodium diet which has caused fluid buildup in her body. Therefore, as indicated by her physical exam, 5lb weight gain in the past 3 weeks, and elevated bnp it is likely she is having an exacerbation of chf. -Echo from 07/2016 shows ef 45-50%, calcified mitral valve annulus, severely dilated right atrium, moderate tricuspid regurgitation, ivc dilated, and diffuse hypokinesis -I/O, heart healthy diet fluid restriction to 1571ml -Chest x-ray pending -EKG pending -60mg  IV Lasix drip bid -Educate patient regarding medication compliance and think about ways to ensure she takes her medication (possible medication alarm or alert) -BNP= 2814.2 -Held lisinopril 20mg  qd and metoprolol tartrate 100mg  bid due to low blood pressure on admission 101-111/46-5, her possible noncompliance with blood pressure medication at home, and her acute chf exacerbation  Acute Kidney Injury The creatinine has elevated from 1.71 3 weeks ago to 2.19 today (7/19). The aki is likely to be post-renal from possible  urinary retention. Pre-renal causes can also be considered due to her venous congestion as indicated from prior echo in 07/2016 that showed elevated central venous pressure. Also she has decreased cardiac output due to her chf and sick sinus syndrome that can contribute to decreased renal perfusion and lead to aki.  -bladder scan pending  Sick Sinus Syndrome Has pacemaker in place  -Monitor heart-rate   Age Related Cognitive Decline Patient's reported auditory and visual hallucinations are likely secondary to her age related cognitive decline.  Will get additional information from family members as to whether she is significantly altered from baseline. -Due to high risk for hospital delirium placed nursing order for orientation q6hrs  Dispo: Admit patient to Inpatient with expected length of stay greater than 2 midnights.  Signed: Lars Mage, MD Internal Medicine PGY1 [phone] 11/03/2016, 4:25 PM

## 2016-11-03 NOTE — Progress Notes (Signed)
   Subjective:    Patient ID: [name], female    DOB: 1925-12-26, 83 y.o.   MRN: [id number]  CC: 4 day follow up for leg swelling  HPI  [name] is an 83yo woman.  She has been seen often in the past month for worsening volume overload.  She has been intermittently not taking her medications and does not stick to a low salt diet.  We have been attempting to get her swelling under control since the end of June.  Today she reports worsening swelling, increase DOE, orthopnea.  She has had a weight gain of about 5 pounds since I last saw her on 6/27.  Her Cr has also been slowly rising during this time of attempting oral lasix and at last check was 2.18.  She is more subdued today and her daughter notes that her blood pressure has been getting lower, today systolic was 782.  She further reports dizziness.    Review of Systems  Constitutional: Positive for activity change and fatigue.  Respiratory: Positive for shortness of breath. Negative for cough.        + orthopnea, no PND  Cardiovascular: Positive for leg swelling. Negative for chest pain.  Gastrointestinal: Positive for diarrhea. Negative for abdominal pain.  Neurological: Positive for dizziness and light-headedness.       Objective:   Physical Exam  Constitutional: She is oriented to person, place, and time. She appears well-developed and well-nourished. No distress.  Appears fatigued  HENT:  Head: Normocephalic and atraumatic.  Eyes:  She has muddy sclerae which is baseline for her  Neck:  Could not appreciate any JVD, but she could not lie past 80 degree angle without getting SOB.   Cardiovascular: Normal rate and regular rhythm.   Difficult to appreciate heart sounds.  Non pitting significant edema to knees  Pulmonary/Chest: Effort normal. She has no wheezes.  She had decreased breath sounds at the right base, concerning for pleural effusion.  Crackles at left base  Abdominal: Soft. She exhibits no distension. There  is no tenderness.  Musculoskeletal: She exhibits edema and tenderness. She exhibits no deformity.  Neurological: She is alert and oriented to person, place, and time.  Skin:  Skin of the lower extremities is shiny and with minimal hair.   Psychiatric: She has a normal mood and affect. Her behavior is normal.    STAT BNP and BMET      Assessment & Plan:  Pending labs.

## 2016-11-03 NOTE — Progress Notes (Signed)
Per order, post void residual = 277cc, documented in Doc Flowsheets.  Dr. Maricela Bo has been notified.

## 2016-11-04 DIAGNOSIS — R0602 Shortness of breath: Secondary | ICD-10-CM

## 2016-11-04 DIAGNOSIS — I5023 Acute on chronic systolic (congestive) heart failure: Secondary | ICD-10-CM

## 2016-11-04 LAB — GLUCOSE, CAPILLARY
GLUCOSE-CAPILLARY: 160 mg/dL — AB (ref 65–99)
GLUCOSE-CAPILLARY: 165 mg/dL — AB (ref 65–99)
Glucose-Capillary: 175 mg/dL — ABNORMAL HIGH (ref 65–99)
Glucose-Capillary: 167 mg/dL — ABNORMAL HIGH (ref 65–99)

## 2016-11-04 LAB — BASIC METABOLIC PANEL
ANION GAP: 7 (ref 5–15)
BUN: 30 mg/dL — ABNORMAL HIGH (ref 6–20)
CALCIUM: 8.8 mg/dL — AB (ref 8.9–10.3)
CO2: 29 mmol/L (ref 22–32)
Chloride: 101 mmol/L (ref 101–111)
Creatinine, Ser: 2.04 mg/dL — ABNORMAL HIGH (ref 0.44–1.00)
GFR, EST AFRICAN AMERICAN: 24 mL/min — AB (ref >60)
GFR, EST NON AFRICAN AMERICAN: 21 mL/min — AB (ref >60)
Glucose, Bld: 165 mg/dL — ABNORMAL HIGH (ref 65–99)
Potassium: 3.5 mmol/L (ref 3.5–5.1)
SODIUM: 137 mmol/L (ref 135–145)

## 2016-11-04 MED ORDER — POTASSIUM CHLORIDE 20 MEQ PO PACK
20.0000 meq | PACK | Freq: Two times a day (BID) | ORAL | Status: DC
  Administered 2016-11-04 – 2016-11-06 (×4): 20 meq via ORAL
  Filled 2016-11-04 (×5): qty 1

## 2016-11-04 MED ORDER — FUROSEMIDE 10 MG/ML IJ SOLN
80.0000 mg | INTRAMUSCULAR | Status: DC
  Administered 2016-11-04 – 2016-11-05 (×3): 80 mg via INTRAVENOUS
  Filled 2016-11-04 (×3): qty 8

## 2016-11-04 NOTE — Progress Notes (Signed)
Subjective: [name] reports feeling poorly today. She was unable to pinpoint exactly what was bothering her. She denies headache, dizziness, chest pain, dyspnea. She does continue to have frequent bowel movements. She reports leg tenderness, which she attributes to the LE edema. Objective: Vital signs in last 24 hours: Vitals:   11/03/16 1605 11/03/16 1949 11/04/16 0200 11/04/16 0602  BP: (!) 112/51 133/70 (!) 104/43 (!) 119/59  Pulse: 66 69 67 72  Resp: 14 18 (!) 22   Temp: 98 F (36.7 C) 97.7 F (36.5 C) 97.8 F (36.6 C) 97.7 F (36.5 C)  TempSrc: Oral Oral Oral Oral  SpO2: 98% 100% 100% 98%  Weight: 60.7 kg (133 lb 12.8 oz)   60.1 kg (132 lb 6.4 oz)  Height: 5' (1.524 m)       Intake/Output Summary (Last 24 hours) at 11/04/16 1128 Last data filed at 11/04/16 1000  Gross per 24 hour  Intake              714 ml  Output              277 ml  Net              437 ml   BP (!) 119/59 (BP Location: Right Arm)   Pulse 72   Temp 97.7 F (36.5 C) (Oral)   Resp (!) 22   Ht 5' (1.524 m)   Wt 60.1 kg (132 lb 6.4 oz) Comment: a scale  SpO2 98%   BMI 25.86 kg/m  General appearance: alert, cooperative, appears stated age, fatigued and no distress Lungs: decreased breath sounds bilat middle and lower lung fields, wheezes in bilat upper lung fields Heart: regular rate and rhythm, S1, S2 normal, no murmur, click, rub or gallop. Pacemaker present. Abdomen: soft, non-tender; bowel sounds normal; no masses,  no organomegaly Extremities: bilat LE 2+ pitting edema, pulses not palpable d/t edema Neurologic: pleasantly demented, oriented to self, location Lab Results: Na 137 K 3.5 Cl 101 CO2 29 Glu 165 BUN 30 Cr 2.04 Ca 8.8 Studies/Results: Dg Chest 2 View  Result Date: 11/03/2016 CLINICAL DATA:  Worsening symptoms of acute CHF. EXAM: CHEST  2 VIEW COMPARISON:  Chest x-rays dated 07/21/2016 in 03/18/2016. FINDINGS: Prominent cardiomegaly, stable. Central pulmonary vascular  congestion and mild interstitial edema, likely chronic, similar to previous exams. Opacity at the right lung base, new compared to earlier plain films, presumably enlarging pleural effusion and/or atelectasis. Osseous and soft tissue structures about the chest are unremarkable. Left chest wall pacemaker/ICD stable in position. IMPRESSION: 1. Layering pleural effusion at the right lung base, moderate in size, increased compared to previous plain film examinations. Probable associated atelectasis. 2. Prominent cardiomegaly, stable. 3. Mild CHF, presumably chronic, similar to previous exams. 4. Aortic atherosclerosis. Electronically Signed   By: Franki Cabot M.D.   On: 11/03/2016 20:23   Medications: I have reviewed the patient's current medications. Scheduled Meds: . aspirin EC  81 mg Oral Daily  . enoxaparin (LOVENOX) injection  30 mg Subcutaneous Q24H  . feeding supplement (ENSURE ENLIVE)  237 mL Oral TID  . furosemide  60 mg Intravenous [id number]  . loratadine  5 mg Oral Daily  . pantoprazole  40 mg Oral Daily  . potassium chloride  20 mEq Oral BID  . sodium chloride flush  3 mL Intravenous Q12H   Continuous Infusions: . sodium chloride     PRN Meds:.sodium chloride, acetaminophen, docusate sodium, ondansetron (ZOFRAN) IV, polyethylene glycol, sodium chloride flush Assessment/Plan:  Active Problems:   Acute on chronic heart failure (HCC)  Acute on chronic heart failure Bilateral Lower Leg Swelling Fatigue [name]' clinical presentation (volume overload, dyspnea, decreased breath sounds) suggests an acute exacerbation of her CHF. Tele unremarkable this AM. CXR shows R pleural effusion. She is down 0.6kg since yesterday. Will continue to diuresis with goal of -1L/day. -Increase IV Furosemide to 80 mg bid -Continuous cardiac monitoring -Hold lisinopril and metoprolol -Fluid and salt restriction diet  AKI Creatinine decreased this morning to 2.04 from 2.19. AKI likely 2/2 low cardiac  output and venous congestion leading to decreased filtration. Will continue diuresing to relieve venous congestion and hopefully improve filtration. Acute process less likely to be due to renal artery stenosis. Continue monitoring. -Repeat BMP in AM  Dementia Age Related Cognitive Decline Patient's reported auditory and visual hallucinations are likely secondary to her age related cognitive decline.  Will get additional information from family members as to whether she is significantly altered from baseline. -Due to high risk for hospital delirium placed nursing order for orientation q6hrs  FEN: Fluid restriction to 1514mL, 2g Na diet  Code status: DNR  Dispo: Anticipated length of hospital stay 1-3 day(s).  This is a Careers information officer Note.  The care of the patient was discussed with Dr. Dr. Heber Selma and the assessment and plan formulated with their assistance.  Please see their attached note for official documentation of the daily encounter.   LOS: 1 day   Murlean Caller, Medical Student 11/04/2016, 11:28 AM  Pager: [phone]

## 2016-11-04 NOTE — Progress Notes (Signed)
Notified pharmacy that PTA med list needs to be completed; pt has dementia, and daughter must be contacted.

## 2016-11-04 NOTE — Progress Notes (Addendum)
Pt is incontinent, and staff is unable to obtain accurate I/O's.  We have placed her on the Pure [place] female catheter, but pt is not very receptive to this apparatus, as she doesn't lie still to keep in place.

## 2016-11-04 NOTE — Evaluation (Signed)
Physical Therapy Evaluation Patient Details Name: [name] MRN: [id number] DOB: 05-Apr-1926 Today's Date: 11/04/2016   History of Present Illness  83 yo woman with pmh of chf class 2, sick sinus syndrome with pacemaker, age related dementia, and osteoporosis was admitted directly from the [facility] Internal medicine clinic for fatigue and bilateral leg swelling  Clinical Impression  Pt admitted with above diagnosis. Pt currently with functional limitations due to the deficits listed below (see PT Problem List). Pt ambulated 120' with RW, no loss of balance, SaO2 99% on RA. She reports multiple falls at home, no family present to confirm, HHPT recommended for home safety/balance assessment. During PT evaluation she was oriented to month/year, location, situation, and to self.  Pt will benefit from skilled PT to increase their independence and safety with mobility to allow discharge to the venue listed below.       Follow Up Recommendations Home health PT    Equipment Recommendations  None recommended by PT    Recommendations for Other Services       Precautions / Restrictions Precautions Precautions: Fall Precaution Comments: "I fall all the time". No family present to confirm. Restrictions Weight Bearing Restrictions: No      Mobility  Bed Mobility Overal bed mobility: Modified Independent             General bed mobility comments: with bedrail, HOB up 20*  Transfers Overall transfer level: Needs assistance Equipment used: Rolling walker (2 wheeled) Transfers: Sit to/from Stand Sit to Stand: Min guard         General transfer comment: VCs hand placement, min/guard safety  Ambulation/Gait Ambulation/Gait assistance: Min guard Ambulation Distance (Feet): 120 Feet Assistive device: Rolling walker (2 wheeled) Gait Pattern/deviations: Step-through pattern;Decreased step length - right;Decreased step length - left   Gait velocity interpretation: at or above  normal speed for age/gender General Gait Details: steady, no LOB; Verbal cues and min A to manage RW for sidestepping thru narrow passageway; SaO2 99% on RA with activity  Stairs            Wheelchair Mobility    Modified Rankin (Stroke Patients Only)       Balance Overall balance assessment: History of Falls;Needs assistance   Sitting balance-Leahy Scale: Good       Standing balance-Leahy Scale: Fair                               Pertinent Vitals/Pain Pain Assessment: No/denies pain    Home Living Family/patient expects to be discharged to:: Private residence Living Arrangements: Alone Available Help at Discharge: Family;Available PRN/intermittently Type of Home: House Home Access: Level entry     Home Layout: One level Home Equipment: Cane - single point;Bedside commode;Walker - 4 wheels;Walker - 2 wheels Additional Comments: Family assists, reports she only takes a shower when her daughter is present    Prior Function Level of Independence: Independent with assistive device(s)         Comments: children come by daily and wash pts clothes, clean house; walks with rollator     Hand Dominance   Dominant Hand: Right    Extremity/Trunk Assessment   Upper Extremity Assessment Upper Extremity Assessment: Overall WFL for tasks assessed    Lower Extremity Assessment Lower Extremity Assessment: Overall WFL for tasks assessed    Cervical / Trunk Assessment Cervical / Trunk Assessment: Normal  Communication   Communication: HOH  Cognition Arousal/Alertness: Awake/alert  Behavior During Therapy: WFL for tasks assessed/performed Overall Cognitive Status: Within Functional Limits for tasks assessed                                 General Comments: oriented to location, month/year, situation, self; able to follow commands      General Comments      Exercises     Assessment/Plan    PT Assessment Patient needs continued  PT services  PT Problem List Decreased activity tolerance;Decreased balance;Decreased mobility       PT Treatment Interventions Gait training;DME instruction;Therapeutic activities;Therapeutic exercise;Patient/family education    PT Goals (Current goals can be found in the Care Plan section)  Acute Rehab PT Goals Patient Stated Goal: DC home PT Goal Formulation: With patient Time For Goal Achievement: 11/18/16 Potential to Achieve Goals: Good    Frequency Min 3X/week   Barriers to discharge        Co-evaluation               AM-PAC PT "6 Clicks" Daily Activity  Outcome Measure Difficulty turning over in bed (including adjusting bedclothes, sheets and blankets)?: None Difficulty moving from lying on back to sitting on the side of the bed? : A Little Difficulty sitting down on and standing up from a chair with arms (e.g., wheelchair, bedside commode, etc,.)?: A Little Help needed moving to and from a bed to chair (including a wheelchair)?: A Little Help needed walking in hospital room?: A Little Help needed climbing 3-5 steps with a railing? : A Little 6 Click Score: 19    End of Session Equipment Utilized During Treatment: Gait belt Activity Tolerance: Patient tolerated treatment well Patient left: in chair;with call bell/phone within reach;with chair alarm set Nurse Communication: Mobility status PT Visit Diagnosis: History of falling (Z91.81);Difficulty in walking, not elsewhere classified (R26.2)    Time: 9038-3338 PT Time Calculation (min) (ACUTE ONLY): 21 min   Charges:   PT Evaluation $PT Eval Low Complexity: 1 Procedure     PT G Codes:          [name] 11/04/2016, 11:16 AM [phone]

## 2016-11-04 NOTE — Progress Notes (Signed)
   Subjective: [name] was seen laying in her bed this morning. She mentioned that she was not feeling well and continued to feel tired. She denied any sob, chest pain, lightheadedness/dizziness. She feels like her leg swelling has not changed and she states that her legs have been sore.   Objective:  Vital signs in last 24 hours: Vitals:   11/03/16 1949 11/04/16 0200 11/04/16 0602 11/04/16 1137  BP: 133/70 (!) 104/43 (!) 119/59 (!) 121/50  Pulse: 69 67 72 83  Resp: 18 (!) 22  20  Temp: 97.7 F (36.5 C) 97.8 F (36.6 C) 97.7 F (36.5 C) 97.7 F (36.5 C)  TempSrc: Oral Oral Oral Oral  SpO2: 100% 100% 98% 100%  Weight:   132 lb 6.4 oz (60.1 kg)   Height:       Physical Exam  Constitutional: She appears well-developed and well-nourished.  HENT:  Head: Normocephalic and atraumatic.  Cardiovascular: Normal rate, regular rhythm, normal heart sounds and intact distal pulses.   Pulmonary/Chest: Effort normal. She has decreased breath sounds. She exhibits no tenderness.  Abdominal: Soft. Bowel sounds are normal.  Neurological:  Oriented to person and place  Skin: No rash noted. No erythema.  2+ pitting bilaterally   Assessment/Plan:  Acute on chronic systolic CHF Exacerbation Bilateral Lower Leg Swelling Fatigue Patient still appears hypervolemic. Bicarb=29, K=3.5, na=137, cr=2.04, gfr=21 all seem stable on diuresis.  -Increase lasix to iv 80mg  bid Daily I/O(7/19):-40 Daily Weight: -1lb Chest x-ray showed layering pleural effusion at the right lung base, prominent cardiomegaly which is stable, mild chf, aortic atherosclerosis.  -EKG pending  Acute Kidney Injury Bladder scan from 7/20 showed 277 residual which does not indicated post renal cause.  Cr=2.04 today 7/20  Sick Sinus Syndrome Has pacemaker in place -continue monitoring heartrate  Age Related Cognitive Decline Patient was oriented to person and place today, but not to month  -Continue orientation q6hrs    Constipation -Continue colace and miralax   Dispo: Anticipated discharge in approximately 2 day(s).   Lars Mage, MD Internal Medicine PGY1 Pager:[phone] 11/04/2016, 12:26 PM

## 2016-11-04 NOTE — Care Management Note (Signed)
Case Management Note  Patient Details  Name: [name] MRN: [id number] Date of Birth: 31-Jan-1926  Subjective/Objective:   CHF               Action/Plan: Patient lives at home alone, her daughter spends the night at times, PCP is Sid Falcon, MD; has prescription drug coverage with [facility] with prescription drug coverage; [facility] choice [facility]; patient states that she was receiving [facility] through [place] but she does not want anyone coming to her home anymore at this time; CM will continue to follow for DCP.  Expected Discharge Date:  11/06/16               Expected Discharge Plan:  [place]  In-House Referral:   [facility]  Discharge planning Services  CM Consult  Choice offered to:  Patient  HH Arranged:  Patient Refused HH  Status of Service:  In process, will continue to follow  Sherrilyn Rist [phone] 11/04/2016, 2:39 PM

## 2016-11-05 ENCOUNTER — Encounter (HOSPITAL_COMMUNITY): Payer: Self-pay

## 2016-11-05 LAB — BASIC METABOLIC PANEL
ANION GAP: 10 (ref 5–15)
BUN: 26 mg/dL — ABNORMAL HIGH (ref 6–20)
CALCIUM: 9.5 mg/dL (ref 8.9–10.3)
CHLORIDE: 99 mmol/L — AB (ref 101–111)
CO2: 32 mmol/L (ref 22–32)
Creatinine, Ser: 1.59 mg/dL — ABNORMAL HIGH (ref 0.44–1.00)
GFR calc Af Amer: 32 mL/min — ABNORMAL LOW (ref >60)
GFR calc non Af Amer: 28 mL/min — ABNORMAL LOW (ref >60)
Glucose, Bld: 148 mg/dL — ABNORMAL HIGH (ref 65–99)
POTASSIUM: 4.1 mmol/L (ref 3.5–5.1)
Sodium: 141 mmol/L (ref 135–145)

## 2016-11-05 LAB — GLUCOSE, CAPILLARY
GLUCOSE-CAPILLARY: 158 mg/dL — AB (ref 65–99)
GLUCOSE-CAPILLARY: 230 mg/dL — AB (ref 65–99)
Glucose-Capillary: 134 mg/dL — ABNORMAL HIGH (ref 65–99)
Glucose-Capillary: 232 mg/dL — ABNORMAL HIGH (ref 65–99)

## 2016-11-05 MED ORDER — FUROSEMIDE 80 MG PO TABS
80.0000 mg | ORAL_TABLET | ORAL | Status: DC
  Filled 2016-11-05: qty 1

## 2016-11-05 NOTE — Progress Notes (Signed)
   Subjective: Ms. Boxell feels better today with less shortness of breath and work of breathing. She has been up out of bed with physical therapy without severe problems. She feels that her legs are still sore but less swollen.  Objective:  Vital signs in last 24 hours: Vitals:   11/04/16 1137 11/04/16 2144 11/05/16 0112 11/05/16 0509  BP: (!) 121/50 140/70 138/62 130/60  Pulse: 83 83 80 80  Resp: 20 19 19 18   Temp: 97.7 F (36.5 C) 97.7 F (36.5 C) 97.9 F (36.6 C) 98 F (36.7 C)  TempSrc: Oral Oral Oral Oral  SpO2: 100% 100% 100% 99%  Weight:    123 lb 9.6 oz (56.1 kg)  Height:       Physical Exam  Constitutional:  Elderly woman in no distress  Neck: JVD present.  Cardiovascular: Normal rate and regular rhythm.   Pulmonary/Chest: Effort normal. No respiratory distress. She has decreased breath sounds. She has no wheezes.  Slightly diminished lung sounds at bases  Abdominal: Soft. Bowel sounds are normal.  Musculoskeletal:  1+ pedal edema b/l  Neurological:  Oriented to person and place   Assessment/Plan: Acute on chronic systolic CHF Exacerbation Bilateral Lower Leg Swelling Right sided pleural effusion She is responding well to IV diuresis down to 123 lbs. Dry weight is approximately 118-120lbs based on admissions for this problem earlier in the year. So far tolerating diuresis well, SCr improving to 1.59 with slight CO2 increase to 32. -Decrease lasix to 60mg  IV BID -Restart home BB metoprolol -Plan to start lisinopril tomorrow if renal function remains good -Repeat Bmet in AM  Acute Kidney Injury on CKD stage 3 Pre-renal injury, most likely due to venous congestion or decreased cardiac output from increased volume. PVR was unremarkable -No further workup planned except as discussed above  Sick Sinus Syndrome Atrial fibrillation On telemetry during diuresis with irregular rhythms throughout. Pacing is working fine.  Deconditioning Osteoporosis HHPT  recommended by therapy. High fall risk patient. -We will consult at discharge to ensure she has continued services available in the home.  Dispo: Anticipated discharge in approximately 1-2 day(s).   Collier Salina, MD PGY-III Internal Medicine Resident Pager# [phone] 11/05/2016, 12:00 PM

## 2016-11-05 NOTE — Progress Notes (Signed)
OT Cancellation Note  Patient Details Name: [name] MRN: [id number] DOB: 03-Aug-1925   Cancelled Treatment:    Reason Eval/Treat Not Completed: Patient declined, no reason specified. Pt reporting "I will not get up right now." Educated and encouraged pt concerning the benefits of participating in ADL and functional mobility for recovery. She verbalized understanding but continued to declined. OT will check back as able for evaluation.   Norman Herrlich, MS OTR/L  Pager: [place] [facility] [place] 11/05/2016, 3:00 PM

## 2016-11-05 NOTE — Progress Notes (Signed)
Subjective: [name] reports feeling fine today. She was alert and oriented to self, time, place. She denies fatigue, difficulty breathing, dizziness, chest or abdominal pain. She thinks her LE edema is much improved. She reports urinating a large amount yesterday. Her daughter did not visit yesterday, but may be around today. Objective: Vital signs in last 24 hours: Vitals:   11/04/16 1137 11/04/16 2144 11/05/16 0112 11/05/16 0509  BP: (!) 121/50 140/70 138/62 130/60  Pulse: 83 83 80 80  Resp: 20 19 19 18   Temp: 97.7 F (36.5 C) 97.7 F (36.5 C) 97.9 F (36.6 C) 98 F (36.7 C)  TempSrc: Oral Oral Oral Oral  SpO2: 100% 100% 100% 99%  Weight:    56.1 kg (123 lb 9.6 oz)  Height:       Weight change: -4.627 kg (-10 lb 3.2 oz)  Intake/Output Summary (Last 24 hours) at 11/05/16 0925 Last data filed at 11/05/16 6599  Gross per 24 hour  Intake             1434 ml  Output             3800 ml  Net            -2366 ml   BP 130/60 (BP Location: Right Arm)   Pulse 80   Temp 98 F (36.7 C) (Oral)   Resp 18   Ht 5' (1.524 m)   Wt 56.1 kg (123 lb 9.6 oz) Comment: Scale A  SpO2 99%   BMI 24.14 kg/m  General appearance: alert, cooperative, appears stated age and no distress Lungs: clear to auscultation bilaterally, decreased breath sounds throughout Heart: regular rate and rhythm, S1, S2 normal, no murmur, click, rub or gallop Extremities: bilat LE 1+ pitting edema Neurologic: Grossly normal Lab Results: N 141 K 4.1 Cl 99 CO2 23 Glu 148 BUN 26 Cr 1.59 Studies/Results: Dg Chest 2 View  Result Date: 11/03/2016 CLINICAL DATA:  Worsening symptoms of acute CHF. EXAM: CHEST  2 VIEW COMPARISON:  Chest x-rays dated 07/21/2016 in 03/18/2016. FINDINGS: Prominent cardiomegaly, stable. Central pulmonary vascular congestion and mild interstitial edema, likely chronic, similar to previous exams. Opacity at the right lung base, new compared to earlier plain films, presumably enlarging pleural  effusion and/or atelectasis. Osseous and soft tissue structures about the chest are unremarkable. Left chest wall pacemaker/ICD stable in position. IMPRESSION: 1. Layering pleural effusion at the right lung base, moderate in size, increased compared to previous plain film examinations. Probable associated atelectasis. 2. Prominent cardiomegaly, stable. 3. Mild CHF, presumably chronic, similar to previous exams. 4. Aortic atherosclerosis. Electronically Signed   By: Franki Cabot M.D.   On: 11/03/2016 20:23   Medications: I have reviewed the patient's current medications. Scheduled Meds: . aspirin EC  81 mg Oral Daily  . enoxaparin (LOVENOX) injection  30 mg Subcutaneous Q24H  . feeding supplement (ENSURE ENLIVE)  237 mL Oral TID  . furosemide  80 mg Intravenous BH-q8a4p  . loratadine  5 mg Oral Daily  . pantoprazole  40 mg Oral Daily  . potassium chloride  20 mEq Oral BID  . sodium chloride flush  3 mL Intravenous Q12H   Continuous Infusions: . sodium chloride     PRN Meds:.sodium chloride, acetaminophen, docusate sodium, ondansetron (ZOFRAN) IV, polyethylene glycol, sodium chloride flush Assessment/Plan: Active Problems:   Hypertension   Atrial fibrillation, controlled (HCC)   Thrombocytopenia (HCC)   CKD (chronic kidney disease) stage 3, GFR 30-59 ml/min   Acute kidney injury (  [place])   Acute on chronic systolic heart failure (HCC)  Acute on chronic systolic CHF Exacerbation Bilateral Lower Leg Swelling Fatigue CXR (7/19) showed R pleural effusion. LE pitting edema improved today. Metabolic panel is stable on diuresis. 7/20 I/O -2681mL. Weight -10lbs from admission. Goals: 120lb dry weight, diurese -1L/day -Decrease IV Lasix to 60mg  bid. Consider switching to PO lasix tomorrow in preparation for discharge. -Restart metoprolol today. Consider restarting lisinopril tomorrow if kidney function remains stable. -Repeat BMP in AM  Acute Kidney Injury Bladder scan from 7/20 showed 277  residual which does not indicated post renal cause. Cr today 1.59, down from 2.04. Back to baseline. Monitor.  Sick Sinus Syndrome Has pacemaker in place -Continue monitoring heartrate  Age Related Cognitive Decline Patient was oriented to person, place, and time today. -Continue orientation q6hrs   Constipation -Continue colace and miralax   Dispo: Anticipated discharge in approximately 2 day(s).   This is a Careers information officer Note.  The care of the patient was discussed with Dr. Dareen Piano and the assessment and plan formulated with their assistance.  Please see their attached note for official documentation of the daily encounter.   LOS: 2 days   Murlean Caller, Medical Student 11/05/2016, 9:25 AM

## 2016-11-06 ENCOUNTER — Other Ambulatory Visit: Payer: Self-pay | Admitting: Internal Medicine

## 2016-11-06 DIAGNOSIS — N179 Acute kidney failure, unspecified: Secondary | ICD-10-CM

## 2016-11-06 DIAGNOSIS — I4891 Unspecified atrial fibrillation: Secondary | ICD-10-CM

## 2016-11-06 DIAGNOSIS — D696 Thrombocytopenia, unspecified: Secondary | ICD-10-CM

## 2016-11-06 DIAGNOSIS — N183 Chronic kidney disease, stage 3 (moderate): Secondary | ICD-10-CM

## 2016-11-06 DIAGNOSIS — I5043 Acute on chronic combined systolic (congestive) and diastolic (congestive) heart failure: Secondary | ICD-10-CM

## 2016-11-06 DIAGNOSIS — I130 Hypertensive heart and chronic kidney disease with heart failure and stage 1 through stage 4 chronic kidney disease, or unspecified chronic kidney disease: Principal | ICD-10-CM

## 2016-11-06 DIAGNOSIS — I5032 Chronic diastolic (congestive) heart failure: Secondary | ICD-10-CM

## 2016-11-06 LAB — BASIC METABOLIC PANEL
Anion gap: 8 (ref 5–15)
BUN: 29 mg/dL — AB (ref 6–20)
CO2: 37 mmol/L — ABNORMAL HIGH (ref 22–32)
Calcium: 10 mg/dL (ref 8.9–10.3)
Chloride: 97 mmol/L — ABNORMAL LOW (ref 101–111)
Creatinine, Ser: 1.58 mg/dL — ABNORMAL HIGH (ref 0.44–1.00)
GFR calc Af Amer: 33 mL/min — ABNORMAL LOW (ref >60)
GFR, EST NON AFRICAN AMERICAN: 28 mL/min — AB (ref >60)
Glucose, Bld: 159 mg/dL — ABNORMAL HIGH (ref 65–99)
POTASSIUM: 4.7 mmol/L (ref 3.5–5.1)
SODIUM: 142 mmol/L (ref 135–145)

## 2016-11-06 LAB — GLUCOSE, CAPILLARY
GLUCOSE-CAPILLARY: 143 mg/dL — AB (ref 65–99)
GLUCOSE-CAPILLARY: 159 mg/dL — AB (ref 65–99)
GLUCOSE-CAPILLARY: 187 mg/dL — AB (ref 65–99)

## 2016-11-06 MED ORDER — FUROSEMIDE 20 MG PO TABS: 60.0000 mg | ORAL_TABLET | Freq: Two times a day (BID) | ORAL | 0 refills | Status: DC

## 2016-11-06 MED ORDER — METOPROLOL TARTRATE 100 MG PO TABS
100.0000 mg | ORAL_TABLET | Freq: Two times a day (BID) | ORAL | Status: DC
  Administered 2016-11-06: 100 mg via ORAL
  Filled 2016-11-06: qty 1

## 2016-11-06 MED ORDER — LISINOPRIL 20 MG PO TABS
20.0000 mg | ORAL_TABLET | Freq: Every day | ORAL | Status: DC
  Administered 2016-11-06: 20 mg via ORAL
  Filled 2016-11-06: qty 1

## 2016-11-06 MED ORDER — FUROSEMIDE 40 MG PO TABS
60.0000 mg | ORAL_TABLET | ORAL | Status: DC
  Administered 2016-11-06: 18:00:00 60 mg via ORAL
  Filled 2016-11-06: qty 1

## 2016-11-06 NOTE — Progress Notes (Signed)
   Subjective: She had large urine output with diuresis and feels like her breathing is greatly improved today. She feels the tightness and swelling in her legs is gone. Overall thinks she feels about at her usual self. She reports improved motivation to take her medicines at home after feeling such a change in 2 days.  Objective:  Vital signs in last 24 hours: Vitals:   11/05/16 1210 11/05/16 2105 11/06/16 0645 11/06/16 1144  BP: (!) 144/78 (!) 112/53 125/62 123/86  Pulse: 78 83 100 98  Resp:  18 18   Temp: 98.4 F (36.9 C) 98.2 F (36.8 C) 98.1 F (36.7 C) 98.5 F (36.9 C)  TempSrc: Oral Oral Oral Oral  SpO2: 90% 95% 93% 97%  Weight:   112 lb 3.2 oz (50.9 kg)   Height:       Physical Exam  Constitutional:  Elderly woman in no distress  Neck: No JVD present.  Cardiovascular: Regular rhythm.   Mild tachycardia  Pulmonary/Chest: Effort normal and breath sounds normal. No respiratory distress. She has no wheezes.  Abdominal: Soft. There is no tenderness.  Musculoskeletal: She exhibits no edema.  Neurological:  Oriented to person and place  Psychiatric: She has a normal mood and affect.   Assessment/Plan: Acute on chronic systolic CHF Exacerbation Bilateral Lower Leg Swelling Right sided pleural effusion She is responding well to IV diuresis down to 112 lbs. Dry weight is approximately 118-120lbs based on admissions for this problem earlier in the year. SCr remains table with metabolic alkalosis CO2 increase to 37 consistent with this large decrease in circulating volume. -Decrease lasix to home PO dosing -Metoprolol back to 100mg  BID -Lisinopril 20mg  restart today  Acute Kidney Injury on CKD stage 3 Remains improved to probably her baseline, SCr around 1.58-1.59  Sick Sinus Syndrome Atrial fibrillation Tachycardic this morning due to withholding her home dose of metoprolol. It has already shown improvement since restarting this. Pacer working  fine.  Deconditioning Osteoporosis HHPT ordered at discharged  Dispo: Anticipated discharge later today.   Collier Salina, MD PGY-III Internal Medicine Resident Pager# [phone] 11/06/2016, 3:48 PM

## 2016-11-06 NOTE — Progress Notes (Signed)
Notified Ms. Joneen Caraway, daughter [phone]) that pt has discharge orders; she informed me that she plans to pick her up around 1800.

## 2016-11-06 NOTE — Discharge Summary (Signed)
Name: [name] MRN: [id number] DOB: 1925-09-30 83 y.o. PCP: Holly Falcon, MD  Date of Admission: 11/03/2016  3:59 PM Date of Discharge: 11/06/2016 Attending Physician: Holly Contes, MD  Discharge Diagnosis: Principal Problem:   Acute on chronic systolic heart failure (HCC) Active Problems:   Chronic combined systolic and diastolic CHF, NYHA class 2 (HCC)   Hypertension   Atrial fibrillation, controlled (HCC)   Thrombocytopenia (HCC)   CKD (chronic kidney disease) stage 3, GFR 30-59 ml/min   Acute kidney injury [facility])   Discharge Medications: Allergies as of 11/06/2016   No Known Allergies     Medication List    STOP taking these medications   furosemide 20 MG tablet Commonly known as:  LASIX     TAKE these medications   aspirin EC 81 MG tablet Take 81 mg by mouth daily.   docusate sodium 100 MG capsule Commonly known as:  COLACE Take 1 capsule (100 mg total) by mouth daily as needed. What changed:  reasons to take this   [place] Take 237 mLs by mouth 3 (three) times daily.   lisinopril 20 MG tablet Commonly known as:  PRINIVIL,ZESTRIL TAKE 1 TABLET BY MOUTH DAILY   loratadine 5 MG chewable tablet Commonly known as:  CLARITIN Chew 1 tablet (5 mg total) by mouth daily.   metoprolol tartrate 100 MG tablet Commonly known as:  LOPRESSOR TAKE 1 TABLET(100 MG) BY MOUTH TWICE DAILY   pantoprazole 40 MG tablet Commonly known as:  PROTONIX Take 1 tablet (40 mg total) by mouth daily.   polyethylene glycol packet Commonly known as:  MIRALAX / GLYCOLAX Take 17 g by mouth daily as needed for mild constipation.   potassium chloride 20 MEQ/15ML (10%) Soln TAKE 15 MLS BY MOUTH DAILY       Disposition and follow-up:   [name] was discharged from [facility] in Good condition.  At the hospital follow up visit please address:  1.  Combined systolic and diastolic heart failure: She was diuresed at  a weight of 112lbs, previous discharge weight ~118, in good condition with resolution of edema and dyspnea. Lasix increased slightly to 60mg  BID PO, but seems more related to missed doses.  2.  Labs / imaging needed at time of follow-up: [facility]  Follow-up Appointments: 7/31 Internal medicine center  Hospital Course by problem list: Acute on chronic systolic heart failure ([place]) [name] presented with peripheral edema, increased weight, SOB, and prominent R pleural effusion after what sounds like numerous missed doses of her home diuretics and not following a particularly low salt diet. She responded rapidly to intravenous diuretics with a recorded weight loss of 22lbs over 3 days.  Atrial fibrillation, controlled ([place]) She became tachycardic to 100s after initially withholding metoprolol in the setting of heart failure and questionable medication compliance. Home medication was restarted without incident prior to discharge and rate control achieved.  Acute kidney injury on CKD stage 3 SCr was elevated to 2.19 on admission and decreased with diuresis, down to 1.59 which appears to be around her baseline. Consistent with prerenal azotemia due to congestive heart failure.   Discharge Vitals:   BP 123/86 (BP Location: Right Arm)   Pulse 98   Temp 98.5 F (36.9 C) (Oral)   Resp 18   Ht 5' (1.524 m)   Wt 112 lb 3.2 oz (50.9 kg) Comment: Scale A  SpO2 97%   BMI 21.91 kg/m   Pertinent  Labs, Studies, and Procedures:   Discharge Instructions: Discharge Instructions    Call MD for:  extreme fatigue    Complete by:  As directed    Call MD for:  persistant dizziness or light-headedness    Complete by:  As directed    Diet - low sodium heart healthy    Complete by:  As directed    Discharge instructions    Complete by:  As directed    This hospital visit was likely due in large part to missing home heart failure medications. The lasix is very important to take twice daily to avoid build  up of fluid on your legs and lungs again.   Increase activity slowly    Complete by:  As directed       Signed: Collier Salina, MD PGY-III Internal Medicine Resident Pager# [phone] 11/09/2016, 8:46 AM

## 2016-11-06 NOTE — Progress Notes (Signed)
Internal Medicine Attending:   I saw and examined the patient. I reviewed the resident's note and I agree with the resident's findings and plan as documented in the resident's note.  

## 2016-11-06 NOTE — Evaluation (Signed)
Occupational Therapy Evaluation Patient Details Name: [name] MRN: [id number] DOB: [birth year] Today's Date: 11/06/2016    History of Present Illness 83 yo woman with pmh of chf class 2, sick sinus syndrome with pacemaker, age related dementia, and osteoporosis was admitted directly from the [facility] Internal medicine clinic for fatigue and bilateral leg swelling   Clinical Impression   PTA, pt reports she was living alone and performing all her ADLs and IADLs. Pt demonstrating confusion and decreased awareness in session, so unsure of accuracy of PLOF or home setup. Currently pt performing ADLs and functional mobility with Min Guard A for safety. Recommend pt dc home once medically stable per physician. Will continue to follow acutely as admitted to facilitate safe dc and increase safety with ADLs.     Follow Up Recommendations  No OT follow up;Supervision - Intermittent    Equipment Recommendations  None recommended by OT    Recommendations for Other Services       Precautions / Restrictions Precautions Precautions: Fall Precaution Comments: "I fall all the time". No family present to confirm. Restrictions Weight Bearing Restrictions: No      Mobility Bed Mobility Overal bed mobility: Modified Independent             General bed mobility comments: bedrails and elevated HOB. Increased time  Transfers Overall transfer level: Needs assistance Equipment used: Rolling walker (2 wheeled) Transfers: Sit to/from Stand Sit to Stand: Min guard         General transfer comment: VCs hand placement, min/guard safety    Balance Overall balance assessment: History of Falls;Needs assistance Sitting-balance support: No upper extremity supported;Feet supported Sitting balance-Leahy Scale: Good     Standing balance support: During functional activity;Single extremity supported Standing balance-Leahy Scale: Fair Standing balance comment: Able to maitain balance for  toileting with single UE supprot                           ADL either performed or assessed with clinical judgement   ADL Overall ADL's : Needs assistance/impaired Eating/Feeding: Set up;Sitting   Grooming: Set up;Sitting   Upper Body Bathing: Sitting;Set up;Supervision/ safety   Lower Body Bathing: Min guard;Sit to/from stand   Upper Body Dressing : Set up;Sitting;Supervision/safety   Lower Body Dressing: Min guard;Sit to/from stand Lower Body Dressing Details (indicate cue type and reason): Adjusted socks without difficulty Toilet Transfer: Min Lawyer Details (indicate cue type and reason): has urinary urgency. Once pt stood, she had to go to urinate quickly. Moved BSC to bed Toileting- Clothing Manipulation and Hygiene: Min guard;Sit to/from stand       Functional mobility during ADLs: Min guard;Rolling walker General ADL Comments: Pt demonstrating overall functional performance at Min guard level for safety. Unsure of pt's cognitive baseline. Pt would maintain conversation that would not be on topic. Sometimes she would talk to herself or say "Are we done. I don't liek talking to people."     Vision         Perception     Praxis      Pertinent Vitals/Pain Pain Assessment: No/denies pain     Hand Dominance Right   Extremity/Trunk Assessment Upper Extremity Assessment Upper Extremity Assessment: Overall WFL for tasks assessed   Lower Extremity Assessment Lower Extremity Assessment: Overall WFL for tasks assessed   Cervical / Trunk Assessment Cervical / Trunk Assessment: Normal   Communication Communication Communication: HOH   Cognition Arousal/Alertness: Awake/alert Behavior  During Therapy: WFL for tasks assessed/performed Overall Cognitive Status: No family/caregiver present to determine baseline cognitive functioning                                 General Comments: oriented to location,  month/year, situation, self; able to follow commands   General Comments       Exercises     Shoulder Instructions      Home Living Family/patient expects to be discharged to:: Private residence Living Arrangements: Alone Available Help at Discharge: Family;Available PRN/intermittently Type of Home: House Home Access: Level entry     Home Layout: One level     Bathroom Shower/Tub: Teacher, early years/pre: Standard Bathroom Accessibility: Yes   Home Equipment: Cane - single point;Bedside commode;Walker - 4 wheels;Walker - 2 wheels   Additional Comments: No family present to confirm home set up or PLOF      Prior Functioning/Environment Level of Independence: Independent with assistive device(s)        Comments: Pt reports "nobody helps me"        OT Problem List: Decreased strength;Decreased activity tolerance;Impaired balance (sitting and/or standing);Decreased safety awareness;Decreased knowledge of use of DME or AE      OT Treatment/Interventions: Self-care/ADL training;Therapeutic exercise;DME and/or AE instruction;Energy conservation;Therapeutic activities;Patient/family education    OT Goals(Current goals can be found in the care plan section) Acute Rehab OT Goals Patient Stated Goal: DC home OT Goal Formulation: With patient Time For Goal Achievement: 11/20/16 Potential to Achieve Goals: Good ADL Goals Pt Will Perform Grooming: standing;with supervision Pt Will Transfer to Toilet: regular height toilet;ambulating;with supervision Pt Will Perform Toileting - Clothing Manipulation and hygiene: sit to/from stand;with supervision  OT Frequency: Min 2X/week   Barriers to D/C:            Co-evaluation              AM-PAC PT "6 Clicks" Daily Activity     Outcome Measure Help from another person eating meals?: None Help from another person taking care of personal grooming?: A Little Help from another person toileting, which includes using  toliet, bedpan, or urinal?: A Little Help from another person bathing (including washing, rinsing, drying)?: A Little Help from another person to put on and taking off regular upper body clothing?: None Help from another person to put on and taking off regular lower body clothing?: A Little 6 Click Score: 20   End of Session Equipment Utilized During Treatment: Gait belt;Rolling walker Nurse Communication: Mobility status  Activity Tolerance: Patient tolerated treatment well Patient left: in chair;with chair alarm set;with call bell/phone within reach  OT Visit Diagnosis: Unsteadiness on feet (R26.81);Other abnormalities of gait and mobility (R26.89);Muscle weakness (generalized) (M62.81);History of falling (Z91.81)                Time: 3825-0539 OT Time Calculation (min): 22 min Charges:  OT General Charges $OT Visit: 1 Procedure OT Evaluation $OT Eval Low Complexity: 1 Procedure G-Codes:     Blayklee Mable MSOT, OTR/L Acute Rehab Pager: [phone] Office: [place] 11/06/2016, 4:43 PM

## 2016-11-13 ENCOUNTER — Encounter (HOSPITAL_COMMUNITY): Payer: Self-pay | Admitting: Emergency Medicine

## 2016-11-13 ENCOUNTER — Emergency Department (HOSPITAL_COMMUNITY)
Admission: EM | Admit: 2016-11-13 | Discharge: 2016-11-13 | Disposition: A | Discharge status: Home / Self Care | Payer: Medicare HMO | Attending: Emergency Medicine | Admitting: Emergency Medicine

## 2016-11-13 ENCOUNTER — Emergency Department (HOSPITAL_COMMUNITY): Payer: Medicare HMO

## 2016-11-13 DIAGNOSIS — S40021A Contusion of right upper arm, initial encounter: Secondary | ICD-10-CM | POA: Diagnosis not present

## 2016-11-13 DIAGNOSIS — W0110XA Fall on same level from slipping, tripping and stumbling with subsequent striking against unspecified object, initial encounter: Secondary | ICD-10-CM | POA: Insufficient documentation

## 2016-11-13 DIAGNOSIS — D649 Anemia, unspecified: Secondary | ICD-10-CM | POA: Insufficient documentation

## 2016-11-13 DIAGNOSIS — Y929 Unspecified place or not applicable: Secondary | ICD-10-CM | POA: Diagnosis not present

## 2016-11-13 DIAGNOSIS — W19XXXA Unspecified fall, initial encounter: Secondary | ICD-10-CM

## 2016-11-13 DIAGNOSIS — S8992XA Unspecified injury of left lower leg, initial encounter: Secondary | ICD-10-CM | POA: Diagnosis not present

## 2016-11-13 DIAGNOSIS — Y999 Unspecified external cause status: Secondary | ICD-10-CM | POA: Diagnosis not present

## 2016-11-13 DIAGNOSIS — Z79899 Other long term (current) drug therapy: Secondary | ICD-10-CM | POA: Insufficient documentation

## 2016-11-13 DIAGNOSIS — I509 Heart failure, unspecified: Secondary | ICD-10-CM | POA: Diagnosis not present

## 2016-11-13 DIAGNOSIS — I130 Hypertensive heart and chronic kidney disease with heart failure and stage 1 through stage 4 chronic kidney disease, or unspecified chronic kidney disease: Secondary | ICD-10-CM | POA: Insufficient documentation

## 2016-11-13 DIAGNOSIS — S40022A Contusion of left upper arm, initial encounter: Secondary | ICD-10-CM | POA: Diagnosis not present

## 2016-11-13 DIAGNOSIS — S4991XA Unspecified injury of right shoulder and upper arm, initial encounter: Secondary | ICD-10-CM | POA: Diagnosis not present

## 2016-11-13 DIAGNOSIS — M79621 Pain in right upper arm: Secondary | ICD-10-CM | POA: Diagnosis not present

## 2016-11-13 DIAGNOSIS — M25562 Pain in left knee: Secondary | ICD-10-CM | POA: Insufficient documentation

## 2016-11-13 DIAGNOSIS — Y9389 Activity, other specified: Secondary | ICD-10-CM | POA: Diagnosis not present

## 2016-11-13 DIAGNOSIS — N183 Chronic kidney disease, stage 3 (moderate): Secondary | ICD-10-CM | POA: Insufficient documentation

## 2016-11-13 DIAGNOSIS — E119 Type 2 diabetes mellitus without complications: Secondary | ICD-10-CM | POA: Diagnosis not present

## 2016-11-13 NOTE — ED Notes (Signed)
Pt given [place] sandwich and gingerale per Dr. Jeanell Sparrow.

## 2016-11-13 NOTE — ED Triage Notes (Addendum)
Pt states she fell and hurt the inside of her right arm. No deformity or swelling. Denies hitting head. Pt legs noted to be swollen as well. States she was recently in the hospital.  When asked to rate her pain on a scale of 1-10 pt sates "I don't have any pain."

## 2016-11-13 NOTE — ED Notes (Signed)
Patient, uses a cane at home. Walked patient with cane in right hand, she states that she needs something in her other hand. Tried patient using walker. She stated she felt more steady using walker.

## 2016-11-13 NOTE — Care Management Note (Signed)
Case Management Note  Patient Details  Name: [name] MRN: [id number] Date of Birth: February 11, 1926  Subjective/Objective:   RW will be delivered to pt prior to discharge from ED at the request of ED staff. Notified Brad with [facility]               Action/Plan:CM will sign off for now but will be available should additional discharge needs arise or disposition change.    Expected Discharge Date:                  Expected Discharge Plan:  Home/Self Care  In-House Referral:     Discharge planning Services  CM Consult  Post Acute Care Choice:  Durable Medical Equipment Choice offered to:     DME Arranged:  Walker rolling DME Agency:     HH Arranged:    [place] Agency:  [place]  Status of Service:  Completed, signed off  If discussed at [place] of Stay Meetings, dates discussed:    Additional Comments:  Delrae Sawyers, RN 11/13/2016, 2:56 PM

## 2016-11-13 NOTE — ED Notes (Signed)
Tried to reach care management by text and phone @ [phone].  Phone did accept message. Was the number in [place].

## 2016-11-13 NOTE — ED Notes (Signed)
EDP at bedside  

## 2016-11-13 NOTE — ED Notes (Signed)
ED Provider at bedside. 

## 2016-11-13 NOTE — ED Provider Notes (Signed)
[place] DEPT Provider Note   CSN: [id number] Arrival date & time: 11/13/16  1212     History   Chief Complaint Chief Complaint  Patient presents with  . Fall    HPI [name] is a 83 y.o. female.  HPI  83 year old female presents today complaining of mechanical fall this a.m. GETTING out of bed. She states that she lost her balance problem floor. She called her daughters who came over and helped her get up.. She has been ambulatory since. She is complaining of some pain in her left knee and right arm. There is some bruising of the medial aspect of the right upper arm. She did not strike her head or lose consciousness. She is not on any blood thinners. She denies neck pain.  Past Medical History:  Diagnosis Date  . Adenomatous colon polyp    tubular  . Allergic rhinitis   . Anemia    Iron deficiency, does not tolerate po  . Arthritis    "all over my body"  . Atrial fibrillation (HCC)    on coumadin followed by Dr. Elie Confer  . CHF (congestive heart failure) (HCC)    diastolic dysfunction  . Constipation   . Dementia   . Diverticulosis of colon (without mention of hemorrhage)   . DJD (degenerative joint disease)   . GERD (gastroesophageal reflux disease)   . History of sick sinus syndrome s/p pacemaker Dr. Lovena Le 2000  . History of trichomonal urethritis   . History of vaginal bleeding 05/2005   nagative endometrial biopsy  . Hypertension    well controlled on 3 agents  . Incontinence overflow, stress female    on vesicare  . Onychomycosis   . Osteoporosis   . Pacemaker   . Restrictive lung disease 1996   PFT's showed mild disease  . Stroke [facility]) 2002   R MCA, cardioembolic  . Type II diabetes mellitus (HCC)    diet controlled  . Weight loss, unintentional 2010-2011   8/08-8/10 138-146 lbs, between 8/10-10/11 lost 18 lbs., work-up negative    Patient Active Problem List   Diagnosis Date Noted  . Acute on chronic systolic heart failure ([place])  11/03/2016  . Knee osteoarthritis 10/12/2016  . Primary pulmonary HTN ([place])   . Venous (peripheral) insufficiency   . Acute exacerbation of CHF (congestive heart failure) ([place]) 07/21/2016  . Acute kidney injury ([place]) 03/16/2016  . Malaise and fatigue 03/16/2016  . Hyperglycemia 10/07/2015  . Fecal incontinence due to anorectal disorder 11/06/2014  . Health care maintenance 02/27/2014  . Malnutrition of moderate degree ([place]) 01/22/2014  . Normocytic anemia 01/21/2014  . CKD (chronic kidney disease) stage 3, GFR 30-59 ml/min 01/21/2014  . Allergic rhinitis 10/16/2013  . Thrombocytopenia ([place]) 09/08/2013  . Decreased hearing 08/23/2012  . At high risk for falls 08/10/2012  . Age-related cognitive decline 08/10/2012  . GERD (gastroesophageal reflux disease) 06/15/2011  . Pacemaker-Medtronic 05/23/2011  . Chronic combined systolic and diastolic CHF, NYHA class 2 ([place])   . Hypertension   . Osteoporosis   . History of stroke   . Atrial fibrillation, controlled ([place] [address])   . Incontinence overflow, stress female   . History of sick sinus syndrome   . B12 deficiency 11/05/2009  . Constipation 10/30/2009    Past Surgical History:  Procedure Laterality Date  . ABDOMINAL HYSTERECTOMY    . CATARACT EXTRACTION    . CHOLECYSTECTOMY    . COLONOSCOPY    . ESOPHAGOGASTRODUODENOSCOPY    . INSERT /  REPLACE / REMOVE PACEMAKER  2000   MDT Sigma pacemaker implanted by Dr Lovena Le;  Marland Kitchen PACEMAKER GENERATOR CHANGE  10/2012   AdaptaL 10/2012 by Dr Lovena Le  . PACEMAKER GENERATOR CHANGE N/A 11/02/2012   Procedure: PACEMAKER GENERATOR CHANGE;  Surgeon: Evans Lance, MD;  Location: [facility] CATH LAB;  Service: Cardiovascular;  Laterality: N/A;    OB History    No data available       Home Medications    Prior to Admission medications   Medication Sig Start Date End Date Taking? Authorizing Provider  aspirin EC 81 MG tablet Take 81 mg by mouth daily.   Yes [provider]  docusate sodium (COLACE)  100 MG capsule Take 1 capsule (100 mg total) by mouth daily as needed. Patient taking differently: Take 100 mg by mouth daily as needed for mild constipation.  06/20/16 06/20/17 Yes Norman Herrlich, MD  furosemide (LASIX) 20 MG tablet TAKE 3 TABLETS BY MOUTH TWICE DAILY 11/08/16  Yes Sid Falcon, MD  lisinopril (PRINIVIL,ZESTRIL) 20 MG tablet TAKE 1 TABLET BY MOUTH DAILY 10/14/16  Yes Sid Falcon, MD  loratadine (CLARITIN) 5 MG chewable tablet Chew 1 tablet (5 mg total) by mouth daily. 08/10/16  Yes Rivet, Sindy Guadeloupe, MD  metoprolol tartrate (LOPRESSOR) 100 MG tablet TAKE 1 TABLET(100 MG) BY MOUTH TWICE DAILY 10/24/16  Yes Sid Falcon, MD  Nutritional Supplements ([place]) LIQD Take 237 mLs by mouth 3 (three) times daily. 12/19/14  Yes Zada Finders, MD  pantoprazole (PROTONIX) 40 MG tablet Take 1 tablet (40 mg total) by mouth daily. 08/10/16  Yes Rivet, Sindy Guadeloupe, MD  polyethylene glycol (MIRALAX / GLYCOLAX) packet Take 17 g by mouth daily as needed for mild constipation. 03/20/16  Yes Maryellen Pile, MD  potassium chloride 20 MEQ/15ML (10%) SOLN TAKE 15 MLS BY MOUTH DAILY 09/02/16  Yes Sid Falcon, MD    Family History Family History  Problem Relation Age of Onset  . Diabetes Sister   . Stroke Sister   . Heart disease Unknown   . Heart disease Father     Social History Social History  Substance Use Topics  . Smoking status: Never Smoker  . Smokeless tobacco: Never Used  . Alcohol use 0.0 oz/week     Comment: Beer occasionally.     Allergies   Patient has no known allergies.   Review of Systems Review of Systems  All other systems reviewed and are negative.    Physical Exam Updated Vital Signs BP 135/60   Pulse 73   Temp 98.4 F (36.9 C) (Oral)   Resp 16   Ht 1.651 m (5\' 5" )   Wt 54.4 kg (120 lb)   SpO2 99%   BMI 19.97 kg/m   Physical Exam  Constitutional: She appears well-developed and well-nourished. No distress.  HENT:  Head: Normocephalic  and atraumatic.  Right Ear: External ear normal.  Left Ear: External ear normal.  Nose: Nose normal.  Mouth/Throat: Oropharynx is clear and moist.  Eyes: Pupils are equal, round, and reactive to light.  Neck: Normal range of motion. Neck supple.  Cardiovascular: Normal rate and regular rhythm.   Pulmonary/Chest: Effort normal and breath sounds normal.  Abdominal: Soft. Bowel sounds are normal.  Musculoskeletal: She exhibits no deformity.  Mild ttp upper arm right, mild ttp lateral aspect left lower knee, no obvious deformity. Pulses and sensation intact.  No hip pain. Full active range of motion bilateral hips.  Neurological:  She is alert. She displays normal reflexes. No cranial nerve deficit. She exhibits normal muscle tone. Coordination normal.  Skin: Skin is warm and dry. Capillary refill takes less than 2 seconds.  Psychiatric: She has a normal mood and affect.  Nursing note and vitals reviewed.    ED Treatments / Results  Labs (all labs ordered are listed, but only abnormal results are displayed) Labs Reviewed - No data to display  EKG  EKG Interpretation None       Radiology Dg Knee Complete 4 Views Left  Result Date: 11/13/2016 CLINICAL DATA:  Fall.  Pain in left knee. EXAM: LEFT KNEE - COMPLETE 4+ VIEW COMPARISON:  None FINDINGS: No joint effusion. No fracture or subluxation identified. No no radiopaque foreign bodies. Vascular calcifications identified. IMPRESSION: 1. No acute findings. Electronically Signed   By: Kerby Moors M.D.   On: 11/13/2016 13:55   Dg Humerus Right  Result Date: 11/13/2016 CLINICAL DATA:  Fall.  Pain EXAM: RIGHT HUMERUS - 2+ VIEW COMPARISON:  None. FINDINGS: There is no evidence of fracture or other focal bone lesions. Soft tissues are unremarkable. IMPRESSION: Negative. Electronically Signed   By: Kerby Moors M.D.   On: 11/13/2016 13:56    Procedures Procedures (including critical care time)  Medications Ordered in ED Medications  - No data to display   Initial Impression / Assessment and Plan / ED Course  I have reviewed the triage vital signs and the nursing notes.  Pertinent labs & imaging results that were available during my care of the patient were reviewed by me and considered in my medical decision making (see chart for details).    83 year old lady with mechanical fall today complaining of some knee pain and right upper arm pain. X-rays obtained here showed no evidence of acute fracture. Patient is intubated here with her cane but felt that she would be better with a walker and more stable. We ablated her with a walker she appeared to walk well. A consult was placed to obtain a walker. I discussed the patient and her daughters, return precautions, and need for follow-up and they voice understanding.  Final Clinical Impressions(s) / ED Diagnoses   Final diagnoses:  Fall, initial encounter  Arm contusion, right, initial encounter  Acute pain of left knee    New Prescriptions New Prescriptions   No medications on file     Pattricia Boss, MD 11/19/16 [phone]

## 2016-11-13 NOTE — Progress Notes (Signed)
PT Cancellation Note  Patient Details Name: [name] MRN: [id number] DOB: 07-Oct-1925   Cancelled Treatment:    Reason Eval/Treat Not Completed: Other (comment); Noted in chart pt getting walker and already walked with EMT.  Spoke with RN who checked with MD; okay to cancel order for PT at this time.   Reginia Naas 11/13/2016, 3:43 PM  Magda Kiel, [place] 11/13/2016

## 2016-11-13 NOTE — ED Notes (Signed)
Patient transported to X-ray 

## 2016-11-15 ENCOUNTER — Ambulatory Visit: Payer: Medicare HMO

## 2016-11-16 ENCOUNTER — Emergency Department (HOSPITAL_COMMUNITY): Payer: Medicare HMO

## 2016-11-16 ENCOUNTER — Observation Stay (HOSPITAL_COMMUNITY)
Admission: EM | Admit: 2016-11-16 | Discharge: 2016-11-19 | Disposition: A | Discharge status: Home / Self Care | Payer: Medicare HMO | Attending: Internal Medicine | Admitting: Internal Medicine

## 2016-11-16 ENCOUNTER — Encounter (HOSPITAL_COMMUNITY): Payer: Self-pay | Admitting: Emergency Medicine

## 2016-11-16 DIAGNOSIS — M810 Age-related osteoporosis without current pathological fracture: Secondary | ICD-10-CM | POA: Insufficient documentation

## 2016-11-16 DIAGNOSIS — F0390 Unspecified dementia without behavioral disturbance: Secondary | ICD-10-CM | POA: Insufficient documentation

## 2016-11-16 DIAGNOSIS — R0602 Shortness of breath: Secondary | ICD-10-CM | POA: Diagnosis not present

## 2016-11-16 DIAGNOSIS — Z7982 Long term (current) use of aspirin: Secondary | ICD-10-CM | POA: Diagnosis not present

## 2016-11-16 DIAGNOSIS — I5032 Chronic diastolic (congestive) heart failure: Secondary | ICD-10-CM

## 2016-11-16 DIAGNOSIS — D649 Anemia, unspecified: Secondary | ICD-10-CM

## 2016-11-16 DIAGNOSIS — R42 Dizziness and giddiness: Secondary | ICD-10-CM | POA: Diagnosis not present

## 2016-11-16 DIAGNOSIS — I130 Hypertensive heart and chronic kidney disease with heart failure and stage 1 through stage 4 chronic kidney disease, or unspecified chronic kidney disease: Secondary | ICD-10-CM | POA: Diagnosis not present

## 2016-11-16 DIAGNOSIS — Z8673 Personal history of transient ischemic attack (TIA), and cerebral infarction without residual deficits: Secondary | ICD-10-CM | POA: Insufficient documentation

## 2016-11-16 DIAGNOSIS — N183 Chronic kidney disease, stage 3 unspecified: Secondary | ICD-10-CM | POA: Diagnosis present

## 2016-11-16 DIAGNOSIS — R195 Other fecal abnormalities: Secondary | ICD-10-CM

## 2016-11-16 DIAGNOSIS — I5082 Biventricular heart failure: Secondary | ICD-10-CM | POA: Diagnosis not present

## 2016-11-16 DIAGNOSIS — N179 Acute kidney failure, unspecified: Secondary | ICD-10-CM | POA: Insufficient documentation

## 2016-11-16 DIAGNOSIS — M1990 Unspecified osteoarthritis, unspecified site: Secondary | ICD-10-CM | POA: Insufficient documentation

## 2016-11-16 DIAGNOSIS — Z950 Presence of cardiac pacemaker: Secondary | ICD-10-CM | POA: Insufficient documentation

## 2016-11-16 DIAGNOSIS — K219 Gastro-esophageal reflux disease without esophagitis: Secondary | ICD-10-CM | POA: Diagnosis not present

## 2016-11-16 DIAGNOSIS — I10 Essential (primary) hypertension: Secondary | ICD-10-CM | POA: Diagnosis present

## 2016-11-16 DIAGNOSIS — E1122 Type 2 diabetes mellitus with diabetic chronic kidney disease: Secondary | ICD-10-CM | POA: Diagnosis not present

## 2016-11-16 DIAGNOSIS — D61818 Other pancytopenia: Secondary | ICD-10-CM | POA: Diagnosis not present

## 2016-11-16 DIAGNOSIS — T502X5A Adverse effect of carbonic-anhydrase inhibitors, benzothiadiazides and other diuretics, initial encounter: Secondary | ICD-10-CM | POA: Diagnosis not present

## 2016-11-16 DIAGNOSIS — Z79899 Other long term (current) drug therapy: Secondary | ICD-10-CM | POA: Diagnosis not present

## 2016-11-16 DIAGNOSIS — I5042 Chronic combined systolic (congestive) and diastolic (congestive) heart failure: Secondary | ICD-10-CM | POA: Diagnosis present

## 2016-11-16 DIAGNOSIS — I4891 Unspecified atrial fibrillation: Secondary | ICD-10-CM | POA: Insufficient documentation

## 2016-11-16 DIAGNOSIS — E860 Dehydration: Secondary | ICD-10-CM | POA: Diagnosis not present

## 2016-11-16 LAB — URINALYSIS, ROUTINE W REFLEX MICROSCOPIC
BILIRUBIN URINE: NEGATIVE
Glucose, UA: NEGATIVE mg/dL
Hgb urine dipstick: NEGATIVE
KETONES UR: NEGATIVE mg/dL
NITRITE: NEGATIVE
PROTEIN: 30 mg/dL — AB
SQUAMOUS EPITHELIAL / LPF: NONE SEEN
Specific Gravity, Urine: 1.01 (ref 1.005–1.030)
pH: 8 (ref 5.0–8.0)

## 2016-11-16 LAB — CBC
HCT: 26.6 % — ABNORMAL LOW (ref 36.0–46.0)
HEMOGLOBIN: 8.6 g/dL — AB (ref 12.0–15.0)
MCH: 29.3 pg (ref 26.0–34.0)
MCHC: 32.3 g/dL (ref 30.0–36.0)
MCV: 90.5 fL (ref 78.0–100.0)
Platelets: 157 10*3/uL (ref 150–400)
RBC: 2.94 MIL/uL — AB (ref 3.87–5.11)
RDW: 17.7 % — ABNORMAL HIGH (ref 11.5–15.5)
WBC: 4.3 10*3/uL (ref 4.0–10.5)

## 2016-11-16 LAB — FERRITIN: Ferritin: 46 ng/mL (ref 11–307)

## 2016-11-16 LAB — BASIC METABOLIC PANEL
Anion gap: 12 (ref 5–15)
BUN: 48 mg/dL — AB (ref 6–20)
CHLORIDE: 100 mmol/L — AB (ref 101–111)
CO2: 29 mmol/L (ref 22–32)
CREATININE: 2.07 mg/dL — AB (ref 0.44–1.00)
Calcium: 9.4 mg/dL (ref 8.9–10.3)
GFR calc Af Amer: 23 mL/min — ABNORMAL LOW (ref >60)
GFR calc non Af Amer: 20 mL/min — ABNORMAL LOW (ref >60)
Glucose, Bld: 122 mg/dL — ABNORMAL HIGH (ref 65–99)
POTASSIUM: 3.4 mmol/L — AB (ref 3.5–5.1)
Sodium: 141 mmol/L (ref 135–145)

## 2016-11-16 LAB — IRON AND TIBC
Iron: 36 ug/dL (ref 28–170)
SATURATION RATIOS: 9 % — AB (ref 10.4–31.8)
TIBC: 379 ug/dL (ref 250–450)
UIBC: 343 ug/dL

## 2016-11-16 LAB — I-STAT TROPONIN, ED: Troponin i, poc: 0.05 ng/mL (ref 0.00–0.08)

## 2016-11-16 LAB — POC OCCULT BLOOD, ED: FECAL OCCULT BLD: POSITIVE — AB

## 2016-11-16 MED ORDER — SODIUM CHLORIDE 0.9% FLUSH
3.0000 mL | Freq: Two times a day (BID) | INTRAVENOUS | Status: DC
  Administered 2016-11-17 – 2016-11-19 (×4): 3 mL via INTRAVENOUS

## 2016-11-16 MED ORDER — ACETAMINOPHEN 325 MG PO TABS
650.0000 mg | ORAL_TABLET | Freq: Four times a day (QID) | ORAL | Status: DC | PRN
  Administered 2016-11-17 – 2016-11-18 (×2): 650 mg via ORAL
  Filled 2016-11-16 (×3): qty 2

## 2016-11-16 MED ORDER — ENSURE ENLIVE PO LIQD
237.0000 mL | Freq: Three times a day (TID) | ORAL | Status: DC
  Administered 2016-11-16 – 2016-11-19 (×4): 237 mL via ORAL

## 2016-11-16 MED ORDER — SODIUM CHLORIDE 0.9 % IV BOLUS (SEPSIS)
500.0000 mL | Freq: Once | INTRAVENOUS | Status: AC
  Administered 2016-11-16: 500 mL via INTRAVENOUS

## 2016-11-16 MED ORDER — ASPIRIN EC 81 MG PO TBEC
81.0000 mg | DELAYED_RELEASE_TABLET | Freq: Every day | ORAL | Status: DC
  Administered 2016-11-16 – 2016-11-19 (×4): 81 mg via ORAL
  Filled 2016-11-16 (×4): qty 1

## 2016-11-16 MED ORDER — POTASSIUM CHLORIDE 20 MEQ/15ML (10%) PO SOLN
20.0000 meq | Freq: Once | ORAL | Status: AC
  Administered 2016-11-16: 20 meq via ORAL
  Filled 2016-11-16 (×2): qty 15

## 2016-11-16 MED ORDER — ENOXAPARIN SODIUM 30 MG/0.3ML ~~LOC~~ SOLN
30.0000 mg | SUBCUTANEOUS | Status: DC
  Administered 2016-11-17 – 2016-11-18 (×2): 30 mg via SUBCUTANEOUS
  Filled 2016-11-16 (×3): qty 0.3

## 2016-11-16 MED ORDER — PANTOPRAZOLE SODIUM 40 MG PO TBEC
40.0000 mg | DELAYED_RELEASE_TABLET | Freq: Every day | ORAL | Status: DC
  Administered 2016-11-16 – 2016-11-19 (×4): 40 mg via ORAL
  Filled 2016-11-16 (×4): qty 1

## 2016-11-16 MED ORDER — SODIUM CHLORIDE 0.9 % IV SOLN
INTRAVENOUS | Status: AC
  Administered 2016-11-16: 20:00:00 via INTRAVENOUS

## 2016-11-16 MED ORDER — ACETAMINOPHEN 650 MG RE SUPP: 650.0000 mg | Freq: Four times a day (QID) | RECTAL | Status: DC | PRN

## 2016-11-16 MED ORDER — LORATADINE 10 MG PO TABS
5.0000 mg | ORAL_TABLET | Freq: Every day | ORAL | Status: DC
  Administered 2016-11-16 – 2016-11-19 (×4): 5 mg via ORAL
  Filled 2016-11-16 (×4): qty 1

## 2016-11-16 MED ORDER — POLYETHYLENE GLYCOL 3350 17 G PO PACK: 17.0000 g | PACK | Freq: Every day | ORAL | Status: DC | PRN

## 2016-11-16 MED ORDER — METOPROLOL TARTRATE 100 MG PO TABS
100.0000 mg | ORAL_TABLET | Freq: Two times a day (BID) | ORAL | Status: DC
  Administered 2016-11-16 – 2016-11-19 (×6): 100 mg via ORAL
  Filled 2016-11-16 (×6): qty 1

## 2016-11-16 NOTE — ED Provider Notes (Signed)
[facility] DEPT Provider Note   CSN: [id number] Arrival date & time: 11/16/16  8242     History   Chief Complaint Chief Complaint  Patient presents with  . Dizziness  . Shortness of Breath  . Leg Pain    HPI [name] is a 83 y.o. female.  [name] is a 83 y.o. Female with a history of A-fib- not on anticoagulants, CHF, anemia, stroke and DM who presents to the ED with her family from home complaining of her heart going fast and feeling dizzy. Patient reports she fell 4 days ago while feeling dizzy. She reports she feels like she might pass out. Which she is describing sounds more like lightheadedness. She reports since she fell she is still been feeling dizzy mostly with position change. She also reports having 4 days of some chest pain and shortness of breath. She lives alone at home. She gets around using a walker and has been too nervous to use this due to feeling lightheaded. She reports feeling her heart rate is beating faster than usual over the past 4 days. She has not taken her Lopressor today. She last took it yesterday evening. She is followed by the internal medicine teaching clinic. She was seen in the emergency department 4 days ago after her fall and had x-rays of her left leg which were unremarkable. She denies any current pain when I evaluated her and reports feeling sore to both of her legs. She reports her left leg is more sore than her right. She denies fevers, increased coughing, vomiting, abdominal pain, diarrhea, headache, numbness, weakness, rashes, or urinary symptoms.    The history is provided by the patient, medical records and a relative. No language interpreter was used.  Dizziness  Associated symptoms: chest pain, palpitations and shortness of breath   Associated symptoms: no diarrhea, no headaches, no nausea, no vomiting and no weakness   Shortness of Breath  Associated symptoms include chest pain and leg pain. Pertinent negatives include  no fever, no headaches, no sore throat, no neck pain, no cough, no wheezing, no vomiting, no abdominal pain, no rash and no leg swelling.  Leg Pain   Pertinent negatives include no numbness.    Past Medical History:  Diagnosis Date  . Adenomatous colon polyp    tubular  . Allergic rhinitis   . Anemia    Iron deficiency, does not tolerate po  . Arthritis    "all over my body"  . Atrial fibrillation (HCC)    on coumadin followed by Dr. Elie Confer  . CHF (congestive heart failure) (HCC)    diastolic dysfunction  . Constipation   . Dementia   . Diverticulosis of colon (without mention of hemorrhage)   . DJD (degenerative joint disease)   . GERD (gastroesophageal reflux disease)   . History of sick sinus syndrome s/p pacemaker Dr. Lovena Le 2000  . History of trichomonal urethritis   . History of vaginal bleeding 05/2005   nagative endometrial biopsy  . Hypertension    well controlled on 3 agents  . Incontinence overflow, stress female    on vesicare  . Onychomycosis   . Osteoporosis   . Pacemaker   . Restrictive lung disease 1996   PFT's showed mild disease  . Stroke [facility]) 2002   R MCA, cardioembolic  . Type II diabetes mellitus (HCC)    diet controlled  . Weight loss, unintentional 2010-2011   8/08-8/10 138-146 lbs, between 8/10-10/11 lost 18 lbs., work-up negative  Patient Active Problem List   Diagnosis Date Noted  . Dehydration 11/16/2016  . Acute on chronic systolic heart failure ([place]) 11/03/2016  . Knee osteoarthritis 10/12/2016  . Primary pulmonary HTN ([place])   . Venous (peripheral) insufficiency   . Acute exacerbation of CHF (congestive heart failure) ([place]) 07/21/2016  . Acute kidney injury ([place]) 03/16/2016  . Malaise and fatigue 03/16/2016  . Hyperglycemia 10/07/2015  . Fecal incontinence due to anorectal disorder 11/06/2014  . Health care maintenance 02/27/2014  . Malnutrition of moderate degree ([place]) 01/22/2014  . Normocytic anemia 01/21/2014  . CKD (chronic  kidney disease) stage 3, GFR 30-59 ml/min 01/21/2014  . Allergic rhinitis 10/16/2013  . Thrombocytopenia ([place]) 09/08/2013  . Decreased hearing 08/23/2012  . At high risk for falls 08/10/2012  . Age-related cognitive decline 08/10/2012  . GERD (gastroesophageal reflux disease) 06/15/2011  . Pacemaker-Medtronic 05/23/2011  . Chronic combined systolic and diastolic CHF, NYHA class 2 ([place])   . Hypertension   . Osteoporosis   . History of stroke   . Atrial fibrillation, controlled ([place])   . Incontinence overflow, stress female   . History of sick sinus syndrome   . B12 deficiency 11/05/2009  . Constipation 10/30/2009    Past Surgical History:  Procedure Laterality Date  . ABDOMINAL HYSTERECTOMY    . CATARACT EXTRACTION    . CHOLECYSTECTOMY    . COLONOSCOPY    . ESOPHAGOGASTRODUODENOSCOPY    . INSERT / REPLACE / REMOVE PACEMAKER  2000   MDT Sigma pacemaker implanted by Dr Lovena Le;  Marland Kitchen PACEMAKER GENERATOR CHANGE  10/2012   AdaptaL 10/2012 by Dr Lovena Le  . PACEMAKER GENERATOR CHANGE N/A 11/02/2012   Procedure: PACEMAKER GENERATOR CHANGE;  Surgeon: Evans Lance, MD;  Location: [facility] CATH LAB;  Service: Cardiovascular;  Laterality: N/A;    OB History    No data available       Home Medications    Prior to Admission medications   Medication Sig Start Date End Date Taking? Authorizing Provider  aspirin EC 81 MG tablet Take 81 mg by mouth daily.   Yes [provider]  docusate sodium (COLACE) 100 MG capsule Take 1 capsule (100 mg total) by mouth daily as needed. Patient taking differently: Take 100 mg by mouth daily as needed for mild constipation.  06/20/16 06/20/17 Yes Norman Herrlich, MD  furosemide (LASIX) 20 MG tablet TAKE 3 TABLETS BY MOUTH TWICE DAILY Patient taking differently: TAKE 3 TABLETS BY MOUTH IN MORNING, 2 TABLETS IN THE EVENING 11/08/16  Yes Sid Falcon, MD  lisinopril (PRINIVIL,ZESTRIL) 20 MG tablet TAKE 1 TABLET BY MOUTH DAILY 10/14/16  Yes Sid Falcon, MD    loratadine (CLARITIN) 5 MG chewable tablet Chew 1 tablet (5 mg total) by mouth daily. 08/10/16  Yes Rivet, Sindy Guadeloupe, MD  metoprolol tartrate (LOPRESSOR) 100 MG tablet TAKE 1 TABLET(100 MG) BY MOUTH TWICE DAILY 10/24/16  Yes Sid Falcon, MD  Nutritional Supplements ([employer]) LIQD Take 237 mLs by mouth 3 (three) times daily. 12/19/14  Yes Zada Finders, MD  pantoprazole (PROTONIX) 40 MG tablet Take 1 tablet (40 mg total) by mouth daily. 08/10/16  Yes Rivet, Sindy Guadeloupe, MD  polyethylene glycol (MIRALAX / GLYCOLAX) packet Take 17 g by mouth daily as needed for mild constipation. 03/20/16  Yes Maryellen Pile, MD  potassium chloride 20 MEQ/15ML (10%) SOLN TAKE 15 MLS BY MOUTH DAILY 09/02/16  Yes Sid Falcon, MD    Family History Family History  Problem Relation Age of Onset  . Diabetes Sister   . Stroke Sister   . Heart disease Unknown   . Heart disease Father     Social History Social History  Substance Use Topics  . Smoking status: Never Smoker  . Smokeless tobacco: Never Used  . Alcohol use 0.0 oz/week     Comment: Beer occasionally.     Allergies   Patient has no known allergies.   Review of Systems Review of Systems  Constitutional: Negative for chills and fever.  HENT: Negative for congestion and sore throat.   Eyes: Negative for visual disturbance.  Respiratory: Positive for shortness of breath. Negative for cough and wheezing.   Cardiovascular: Positive for chest pain and palpitations. Negative for leg swelling.  Gastrointestinal: Negative for abdominal pain, diarrhea, nausea and vomiting.  Genitourinary: Negative for dysuria.  Musculoskeletal: Negative for back pain and neck pain.  Skin: Negative for rash.  Neurological: Positive for light-headedness. Negative for dizziness, syncope, weakness, numbness and headaches.     Physical Exam Updated Vital Signs BP 132/74   Pulse (!) 102   Temp 98 F (36.7 C) (Oral)   Resp 18   Wt 54.4 kg (120 lb)    SpO2 99%   BMI 19.97 kg/m   Physical Exam  Constitutional: She appears well-developed and well-nourished. No distress.  Nontoxic appearing.  HENT:  Head: Normocephalic and atraumatic.  Right Ear: External ear normal.  Left Ear: External ear normal.  Mouth/Throat: Oropharynx is clear and moist.  No visible or palpated signs of head injury or trauma.  Eyes: Pupils are equal, round, and reactive to light. Conjunctivae and EOM are normal. Right eye exhibits no discharge. Left eye exhibits no discharge.  Neck: Normal range of motion. Neck supple. No JVD present. No tracheal deviation present.  Cardiovascular: Normal heart sounds and intact distal pulses.  Exam reveals no gallop and no friction rub.   No murmur heard. Heart rate is 104. Irregular irregular rhythm. A. fib on the monitor.  Pulmonary/Chest: Effort normal. No stridor. No respiratory distress. She has no wheezes. She has no rales.  Diminished lung sounds to her bilateral bases. No increased work of breathing.  Abdominal: Soft. There is no tenderness. There is no guarding.  Musculoskeletal: Normal range of motion. She exhibits no edema, tenderness or deformity.  No lower extremity edema or tenderness noted. Good strength to bilateral upper and lower extremities. Good equal grip strengths bilaterally.  Lymphadenopathy:    She has no cervical adenopathy.  Neurological: She is alert. No cranial nerve deficit or sensory deficit. She exhibits normal muscle tone. Coordination normal.  Patient is alert and oriented. She has some difficulties with the date, but is otherwise oriented. Cranial nerves are intact. Good equal grip strengths bilaterally. EOMs are intact. Vision is grossly intact.  Skin: Skin is warm and dry. Capillary refill takes less than 2 seconds. No rash noted. She is not diaphoretic. No erythema. No pallor.  Psychiatric: She has a normal mood and affect. Her behavior is normal.  Nursing note and vitals reviewed.    ED  Treatments / Results  Labs (all labs ordered are listed, but only abnormal results are displayed) Labs Reviewed  BASIC METABOLIC PANEL - Abnormal; Notable for the following:       Result Value   Potassium 3.4 (*)    Chloride 100 (*)    Glucose, Bld 122 (*)    BUN 48 (*)    Creatinine, Ser 2.07 (*)  GFR calc non Af Amer 20 (*)    GFR calc Af Amer 23 (*)    All other components within normal limits  CBC - Abnormal; Notable for the following:    RBC 2.94 (*)    Hemoglobin 8.6 (*)    HCT 26.6 (*)    RDW 17.7 (*)    All other components within normal limits  URINALYSIS, ROUTINE W REFLEX MICROSCOPIC  I-STAT TROPONIN, ED  POC OCCULT BLOOD, ED    EKG  EKG Interpretation  Date/Time:  Wednesday November 16 2016 09:34:34 EDT Ventricular Rate:  101 PR Interval:    QRS Duration: 96 QT Interval:  338 QTC Calculation: 438 R Axis:   -65 Text Interpretation:  Atrial fibrillation with rapid ventricular response Left axis deviation Possible Anterior infarct , age undetermined ST & T wave abnormality, consider inferior ischemia Abnormal ECG SINCE LAST TRACING HEART RATE HAS INCREASED Confirmed by Orlie Dakin [phone]) on 11/16/2016 11:50:24 AM Also confirmed by Orlie Dakin [phone])  on 11/16/2016 12:50:59 PM       Radiology Dg Chest 2 View  Result Date: 11/16/2016 CLINICAL DATA:  Shortness of breath and dizziness for 3 days. EXAM: CHEST  2 VIEW COMPARISON:  11/03/2016 and 06/26/2016 FINDINGS: Stable cardiomegaly. Single lead transvenous pacemaker remains in appropriate position. Stable diffuse pulmonary interstitial prominence, consistent with pulmonary vascular congestion. No evidence of pulmonary consolidation or edema. No evidence of pleural effusion on today's study. IMPRESSION: Stable cardiomegaly and pulmonary venous hypertension. No acute findings. Electronically Signed   By: Earle Gell M.D.   On: 11/16/2016 10:29    Procedures Procedures (including critical care  time)  Medications Ordered in ED Medications  sodium chloride 0.9 % bolus 500 mL (0 mLs Intravenous Stopped 11/16/16 1432)  sodium chloride 0.9 % bolus 500 mL (500 mLs Intravenous New Bag/Given 11/16/16 1313)     Initial Impression / Assessment and Plan / ED Course  I have reviewed the triage vital signs and the nursing notes.  Pertinent labs & imaging results that were available during my care of the patient were reviewed by me and considered in my medical decision making (see chart for details).    This  is a 83 y.o. Female with a history of A-fib- not on anticoagulants, CHF, anemia, stroke and DM who presents to the ED with her family from home complaining of her heart going fast and feeling dizzy. Patient reports she fell 4 days ago while feeling dizzy. She reports she feels like she might pass out. Which she is describing sounds more like lightheadedness. She reports since she fell she is still been feeling dizzy mostly with position change. She also reports having 4 days of some chest pain and shortness of breath. She lives alone at home. She gets around using a walker and has been too nervous to use this due to feeling lightheaded. She reports feeling her heart rate is beating faster than usual over the past 4 days. She has not taken her Lopressor today. She last took it yesterday evening. She is followed by the internal medicine teaching clinic. She was seen in the emergency department 4 days ago after her fall and had x-rays of her left leg which were unremarkable. On exam the patient is afebrile nontoxic appearing. Heart rate is slightly elevated 100. She is in A. fib on the monitor. She appears slightly dry. No focal neurological deficits identified. She denies room spinning dizziness.  Troponin is not elevated. BMP shows an increase in  her creatinine to 2.07. CBC shows a decrease in her hemoglobin from 9.8-8.6 in the past 2 weeks. Patient denies any bloody stool or melena. No gross bloody  stool or melena on digital rectal exam however Hemoccult was positive. Chest x-ray shows stable findings and no acute findings.  Suspect patient is dehydrated. Will provide with fluid bolus. Patient will also need evaluation for this decreased hemoglobin and positive occult blood in her stool. This could also contribute to her feeling lightheaded and short of breath. Will plan for admission. Patient and family agree with plan for admission.  I consulted with internal medicine teaching service who accepted the patient for admission.   This patient was discussed with and evaluated by Dr. Winfred Leeds who agrees with assessment and plan.   Final Clinical Impressions(s) / ED Diagnoses   Final diagnoses:  Dehydration  Occult blood in stools  Lightheadedness  Anemia, unspecified type    New Prescriptions New Prescriptions   No medications on file     Waynetta Pean, Hershal Coria 11/16/16 [place], MD 11/16/16 1700

## 2016-11-16 NOTE — H&P (Signed)
Date: 11/16/2016               Patient Name:  [name] MRN: [id number]  DOB: 08-Oct-1925 Age / Sex: 83 y.o., female   PCP: Sid Falcon, MD         Medical Service: Internal Medicine Teaching Service         Attending Physician: Dr. Annia Belt, MD    First Contact: Dr. Aggie Hacker Pager: [phone]  Second Contact: Dr. Juleen China Pager: [phone]       After Hours (After 5p/  First Contact Pager: [phone]  weekends / holidays): Second Contact Pager: [phone]   Chief Complaint: Dizziness  History of Present Illness:   83 yo female with PMHx systolic congestive heart failure, sick sinus syndrome status post pacemaker, dementia, and osteoporosis presenting with a chief complaint of dizziness. The patient is accompanied by her two daughters, which much of the history was obtained from. The dizziness started about one week ago and was seen in the ED on 11/13/2016 for a fall. The patient states that she has "tunnel vision" dizziness when standing from a seated position. She also states that over all does not feel well, and complains of general weakness. The patient lives at home alone and does not cook for herself, and per daughters does not drink much water.   She was recently discharged stable from the hospital on 11/06/2016 for dyspnea, volume overload, and acute on chronic systolic HF. She was diuresed and lost 22 lbs in 3 days. On discharge her Lasix dose was increased to 60 mg BID. Per patient's daughter's her Lasix regemin is 60 mg in the AM and 40 mg at night.  Denies fever, chills, nausea, vomiting, and diarrhea.   Received 1 Liter NS in ED.    Meds:  Current Meds  Medication Sig  . aspirin EC 81 MG tablet Take 81 mg by mouth daily.  Marland Kitchen docusate sodium (COLACE) 100 MG capsule Take 1 capsule (100 mg total) by mouth daily as needed. (Patient taking differently: Take 100 mg by mouth daily as needed for mild constipation. )  . furosemide (LASIX) 20 MG tablet TAKE 3 TABLETS  BY MOUTH TWICE DAILY (Patient taking differently: TAKE 3 TABLETS BY MOUTH IN MORNING, 2 TABLETS IN THE EVENING)  . lisinopril (PRINIVIL,ZESTRIL) 20 MG tablet TAKE 1 TABLET BY MOUTH DAILY  . loratadine (CLARITIN) 5 MG chewable tablet Chew 1 tablet (5 mg total) by mouth daily.  . metoprolol tartrate (LOPRESSOR) 100 MG tablet TAKE 1 TABLET(100 MG) BY MOUTH TWICE DAILY  . Nutritional Supplements ([place]) LIQD Take 237 mLs by mouth 3 (three) times daily.  . pantoprazole (PROTONIX) 40 MG tablet Take 1 tablet (40 mg total) by mouth daily.  . polyethylene glycol (MIRALAX / GLYCOLAX) packet Take 17 g by mouth daily as needed for mild constipation.  . potassium chloride 20 MEQ/15ML (10%) SOLN TAKE 15 MLS BY MOUTH DAILY     Allergies: Allergies as of 11/16/2016  . (No Known Allergies)   Past Medical History:  Diagnosis Date  . Adenomatous colon polyp    tubular  . Allergic rhinitis   . Anemia    Iron deficiency, does not tolerate po  . Arthritis    "all over my body"  . Atrial fibrillation (HCC)    on coumadin followed by Dr. Elie Confer  . CHF (congestive heart failure) (HCC)    diastolic dysfunction  . Constipation   . Dementia   .  Diverticulosis of colon (without mention of hemorrhage)   . DJD (degenerative joint disease)   . GERD (gastroesophageal reflux disease)   . History of sick sinus syndrome s/p pacemaker Dr. Lovena Le 2000  . History of trichomonal urethritis   . History of vaginal bleeding 05/2005   nagative endometrial biopsy  . Hypertension    well controlled on 3 agents  . Incontinence overflow, stress female    on vesicare  . Onychomycosis   . Osteoporosis   . Pacemaker   . Restrictive lung disease 1996   PFT's showed mild disease  . Stroke [facility]) 2002   R MCA, cardioembolic  . Type II diabetes mellitus (HCC)    diet controlled  . Weight loss, unintentional 2010-2011   8/08-8/10 138-146 lbs, between 8/10-10/11 lost 18 lbs., work-up negative     Family History:   Social History: Lives alone, but needs assistance with ADLs.   Review of Systems: A complete ROS was negative except as per HPI.   Physical Exam: Blood pressure 127/66, pulse 91, temperature (!) 97.4 F (36.3 C), temperature source Oral, resp. rate (!) 22, weight 120 lb (54.4 kg), SpO2 98 %. Physical Exam  Constitutional: She is oriented to person, place, and time. She appears cachectic. She appears ill. No distress.  HENT:  Head: Normocephalic and atraumatic.  Mouth/Throat: Mucous membranes are dry.  Eyes: Pupils are equal, round, and reactive to light. Conjunctivae and EOM are normal.  Neck: Normal range of motion. JVD present.  Cardiovascular: Normal heart sounds and intact distal pulses.  An irregularly irregular rhythm present. Tachycardia present.   Pulmonary/Chest: Effort normal and breath sounds normal.  Abdominal: Soft. Bowel sounds are normal. There is no tenderness.  Musculoskeletal: Normal range of motion. She exhibits no edema.  Neurological: She is alert and oriented to person, place, and time.  Skin: Skin is warm and dry.  Psychiatric: She has a normal mood and affect.    EKG: personally reviewed my interpretation is atrial fibrillation.   CXR: personally reviewed my interpretation is no acute findings. Stable cardiomegaly.   Assessment & Plan by Problem: Active Problems:   Chronic combined systolic and diastolic CHF, NYHA class 2 (HCC)   Hypertension   Atrial fibrillation, controlled (HCC)   CKD (chronic kidney disease) stage 3, GFR 30-59 ml/min   Dehydration  Dizziness Most likely associated with over diuresis, dehydration, and decreased PO intake. Mucous membranes dry on exam. Cr 2.07. BP stable 113/83.  -NS 75 cc/hr  -Orthostatics ordered  -Holding Lasix  -BMET in the AM  -PT/OT Eval  CHF, HTN,Afib -Lopressor 100 mg BID -Aspirin 81 mg  Anemia Hgb 8.6 slowly trending down for several months. MCV, ferritin, TIBC WNL. Fecal  occult positive. -CBC in AM -Protonix 40 mg BID  DVT PPx: Lovenox  Dispo: Admit patient to Observation with expected length of stay less than 2 midnights.  Signed: Melanee Spry, MD 11/16/2016, 6:58 PM  Pager: [phone]

## 2016-11-16 NOTE — ED Triage Notes (Signed)
Pt arrives via POV from home with a fall Sunday. States she woke up Sunday morning feeling dizzy and SOB. Pt c/o pain to left lower extremity. No brusing, swelling, deformity noted. Pt ambulatory with cane short distances. Pt alert, oriented to name, place, unsure of date.

## 2016-11-16 NOTE — ED Provider Notes (Signed)
Patient with lightheadedness, worse with standing and dyspnea with walking onset 4 days ago coming by diminished appetite. Patient seen here 11/13/2016 for fall., Treated and released. On exam Patient is chronically ill-appearing HEENT exam no facial asymmetry is clear auscultation heart irregularly irregular abdomen nondistended nontender extremities without edema or tenderness   Orlie Dakin, MD 11/16/16 1251

## 2016-11-17 DIAGNOSIS — N183 Chronic kidney disease, stage 3 (moderate): Secondary | ICD-10-CM

## 2016-11-17 DIAGNOSIS — R195 Other fecal abnormalities: Secondary | ICD-10-CM

## 2016-11-17 DIAGNOSIS — I130 Hypertensive heart and chronic kidney disease with heart failure and stage 1 through stage 4 chronic kidney disease, or unspecified chronic kidney disease: Secondary | ICD-10-CM | POA: Diagnosis not present

## 2016-11-17 DIAGNOSIS — I5042 Chronic combined systolic (congestive) and diastolic (congestive) heart failure: Secondary | ICD-10-CM | POA: Diagnosis not present

## 2016-11-17 DIAGNOSIS — D61818 Other pancytopenia: Secondary | ICD-10-CM | POA: Diagnosis not present

## 2016-11-17 DIAGNOSIS — R42 Dizziness and giddiness: Secondary | ICD-10-CM

## 2016-11-17 DIAGNOSIS — E860 Dehydration: Secondary | ICD-10-CM | POA: Diagnosis not present

## 2016-11-17 DIAGNOSIS — I5082 Biventricular heart failure: Secondary | ICD-10-CM

## 2016-11-17 DIAGNOSIS — I4891 Unspecified atrial fibrillation: Secondary | ICD-10-CM | POA: Diagnosis not present

## 2016-11-17 LAB — BASIC METABOLIC PANEL
ANION GAP: 12 (ref 5–15)
BUN: 43 mg/dL — ABNORMAL HIGH (ref 6–20)
CHLORIDE: 106 mmol/L (ref 101–111)
CO2: 26 mmol/L (ref 22–32)
Calcium: 8.8 mg/dL — ABNORMAL LOW (ref 8.9–10.3)
Creatinine, Ser: 1.81 mg/dL — ABNORMAL HIGH (ref 0.44–1.00)
GFR calc Af Amer: 28 mL/min — ABNORMAL LOW (ref >60)
GFR, EST NON AFRICAN AMERICAN: 24 mL/min — AB (ref >60)
Glucose, Bld: 174 mg/dL — ABNORMAL HIGH (ref 65–99)
POTASSIUM: 4 mmol/L (ref 3.5–5.1)
SODIUM: 144 mmol/L (ref 135–145)

## 2016-11-17 LAB — CBC
HEMATOCRIT: 26 % — AB (ref 36.0–46.0)
HEMOGLOBIN: 8.4 g/dL — AB (ref 12.0–15.0)
MCH: 29.8 pg (ref 26.0–34.0)
MCHC: 32.3 g/dL (ref 30.0–36.0)
MCV: 92.2 fL (ref 78.0–100.0)
Platelets: 131 10*3/uL — ABNORMAL LOW (ref 150–400)
RBC: 2.82 MIL/uL — AB (ref 3.87–5.11)
RDW: 18.1 % — AB (ref 11.5–15.5)
WBC: 4.8 10*3/uL (ref 4.0–10.5)

## 2016-11-17 MED ORDER — ORAL CARE MOUTH RINSE: 15.0000 mL | Freq: Two times a day (BID) | OROMUCOSAL | Status: DC

## 2016-11-17 MED ORDER — CHLORHEXIDINE GLUCONATE 0.12 % MT SOLN
15.0000 mL | Freq: Two times a day (BID) | OROMUCOSAL | Status: DC
  Administered 2016-11-17 – 2016-11-19 (×4): 15 mL via OROMUCOSAL
  Filled 2016-11-17 (×5): qty 15

## 2016-11-17 NOTE — Care Management Note (Addendum)
Case Management Note  Patient Details  Name: [name] MRN: [id number] Date of Birth: Oct 25, 1925  Subjective/Objective:                    Action/Plan:  Awaiting PT /OT recommendations. Patient lives with family, has walker .She does NOT want  SNF.  spoke with dtr-[name] [phone]), family does want home health services.  [name] [phone]) to offer choice.  Spoke with [name]. Patient is active with [facility] . [name] [phone] aware patient has been admitted under observation. No new orders needed. Dr Wilhemina Cash aware. Expected Discharge Date:                  Expected Discharge Plan:     In-House Referral:     Discharge planning Services  CM Consult  Post Acute Care Choice:    Choice offered to:  Patient  DME Arranged:    DME Agency:     HH Arranged:    [place] Agency:     Status of Service:  In process, will continue to follow  If discussed at Long Length of Stay Meetings, dates discussed:    Additional Comments:  Holly Favre, RN 11/17/2016, 10:08 AM

## 2016-11-17 NOTE — Discharge Summary (Signed)
Name: [name] MRN: [id number] DOB: 13-Jul-1925 83 y.o. PCP: Holly Falcon, MD  Date of Admission: 11/16/2016 11:03 AM Date of Discharge: 11/19/2016 Attending Physician: Holly Linsey, MD  Discharge Diagnosis: Active Problems:   Chronic combined systolic and diastolic CHF, NYHA class 2 ([place])   Hypertension   Atrial fibrillation, controlled ([place])   CKD (chronic kidney disease) stage 3, GFR 30-59 ml/min   Hypertensive heart and kidney disease with biventricular heart failure and stage 3 chronic kidney disease ([place])   Occult blood in stools   Other pancytopenia ([place])   Discharge Medications: Allergies as of 11/19/2016   No Known Allergies     Medication List    STOP taking these medications   lisinopril 20 MG tablet Commonly known as:  PRINIVIL,ZESTRIL     TAKE these medications   aspirin EC 81 MG tablet Take 81 mg by mouth daily.   docusate sodium 100 MG capsule Commonly known as:  COLACE Take 1 capsule (100 mg total) by mouth daily as needed. What changed:  reasons to take this   [place] Take 237 mLs by mouth 3 (three) times daily.   furosemide 20 MG tablet Commonly known as:  LASIX Take 2 tablets (40 mg total) by mouth daily. What changed:  See the new instructions.   loratadine 5 MG chewable tablet Commonly known as:  CLARITIN Chew 1 tablet (5 mg total) by mouth daily.   metoprolol tartrate 100 MG tablet Commonly known as:  LOPRESSOR TAKE 1 TABLET(100 MG) BY MOUTH TWICE DAILY   pantoprazole 40 MG tablet Commonly known as:  PROTONIX Take 1 tablet (40 mg total) by mouth daily.   polyethylene glycol packet Commonly known as:  MIRALAX / GLYCOLAX Take 17 g by mouth daily as needed for mild constipation.   potassium chloride 20 MEQ/15ML (10%) Soln TAKE 15 MLS BY MOUTH DAILY       Disposition and follow-up:   [name] was discharged from [facility] in Stable condition.  At the hospital follow  up visit please address:  1.  -Ensure adherence to diuretic regimen and assistance being provided at home.  -Assess volume status and heart failure symptoms to assess diuretic regimen and adjust as necessary.  -Restart lisinopril if Cr, BP continue to be stable    2.  Labs / imaging needed at time of follow-up: Repeat BMP   3.  Pending labs/ test needing follow-up: SPEP, IFE, QIG   Follow-up Appointments: Follow-up Information    Holly Falcon, MD Follow up.   Specialty:  Internal Medicine Why:  Call the [facility] at [phone] first thing Monday to make an appointment  Contact information: [place] [id number] [address] Hospital Course by problem list: Active Problems:   Chronic combined systolic and diastolic CHF, NYHA class 2 (HCC)   Hypertension   Atrial fibrillation, controlled (HCC)   CKD (chronic kidney disease) stage 3, GFR 30-59 ml/min   Hypertensive heart and kidney disease with biventricular heart failure and stage 3 chronic kidney disease ([place])   Occult blood in stools   Other pancytopenia ([place])   1. Dehydration secondary to diuretic therapy, Chronic combined systolic and diastolic heart failure She presented with complaints of light-headedness upon standing and instability, admitted for dehydration likely due to her diuretic regimen for HF (LV and RV dysfunction, pulm HTN on most recent echo 07/2016). Her volume  status has been difficult to control in the past with issues of medication adherence and both volume overload and dehydration. It appears she had improved medication adherence after her admission for HF exacerbation 7/19-7/22 resulting in dehydration on a regimen of 60 mg in am, 40 mg in pm. Her orthostatic symptoms and overall subjective state improved with holding diuretics and gentle IV hydration. She was restarted on Lasix 40 mg PO daily which gave good diuresis, her Cr remained stable. Her diuretic needs are probably decreased  than her prior regimen if she remains adherent. Her target weight may also need to be updated to around 120 lbs given her current asymptomatic and euvolemic status. Pt's daughter expressed understanding of medication changes, need for adherence, and checking weight daily. She was discharged on 40 mg Lasix PO daily.  2. Chronic Kidney Disease, stage 3, Acute Kidney Injury secondary to dehydration Her Cr on admission was elevated to 2.07, baseline likely around 1.3-1.5, due to over diuresis described above. Her home lisinopril was held. Her kidney function improved with IV hydration and holding diuretics. On discharge, Cr was 1.8 which may be a new, slightly increased baseline. She was discharged with instructions to hold her lisinopril until addressed on follow up.    3. Atrial fibrillation, rate controlled Pt presented with atrial fibrillation with tachycardia in setting of dehydration and missed metoprolol doses. Her home metoprolol 100 mg BID was restarted. Her heart rate responded well and she remained rate controlled.   4. Anemia, chronic, normocytic Pt with normocytic anemia (Hgb 8.4) which has been steadily declining over the past several months, also low to borderline white cell and platelet counts. Normal iron profile not indicative of IDA or ACD, likely . During admission, further workup with myeloma panel was pending at time of discharge though peripheral blood smear and normal free light chain ratio is reassuring. Her Hgb and counts remained stable throughout admission.   Discharge Vitals:   BP (!) 122/57 (BP Location: Left Arm)   Pulse (!) 59   Temp (!) 97.5 F (36.4 C) (Axillary)   Resp 17   Ht 5' (1.524 m)   Wt 116 lb 6.4 oz (52.8 kg)   SpO2 93%   BMI 22.73 kg/m   Pertinent Labs, Studies, and Procedures:  CBC    Component Value Date/Time   WBC 4.4 11/18/2016 0412   RBC 2.87 (L) 11/18/2016 0412   HGB 8.5 (L) 11/18/2016 0412   HGB 9.4 (L) 10/12/2016 1050   HCT 26.3 (L)  11/18/2016 0412   HCT 28.8 (L) 10/12/2016 1050   PLT 141 (L) 11/18/2016 0412   PLT 134 (L) 10/12/2016 1050   MCV 91.6 11/18/2016 0412   MCV 94 10/12/2016 1050   MCH 29.6 11/18/2016 0412   MCHC 32.3 11/18/2016 0412   RDW 18.1 (H) 11/18/2016 0412   RDW 17.7 (H) 10/12/2016 1050   LYMPHSABS 1.3 11/18/2016 0412   LYMPHSABS 1.2 05/20/2015 1120   MONOABS 0.5 11/18/2016 0412   EOSABS 0.1 11/18/2016 0412   EOSABS 0.1 05/20/2015 1120   BASOSABS 0.0 11/18/2016 0412   BASOSABS 0.0 05/20/2015 1120   BMET    Component Value Date/Time   NA 140 11/19/2016 0705   NA 142 10/12/2016 1050   K 4.6 11/19/2016 0705   CL 104 11/19/2016 0705   CO2 27 11/19/2016 0705   GLUCOSE 131 (H) 11/19/2016 0705   BUN 39 (H) 11/19/2016 0705   BUN 26 10/12/2016 1050   CREATININE 1.81 (H) 11/19/2016 1829  CREATININE 1.05 06/11/2014 1124   CALCIUM 8.9 11/19/2016 0705   GFRNONAA 24 (L) 11/19/2016 0705   GFRNONAA 48 (L) 06/11/2014 1124   GFRAA 28 (L) 11/19/2016 0705   GFRAA 56 (L) 06/11/2014 1124    Discharge Instructions: Discharge Instructions    Diet - low sodium heart healthy    Complete by:  As directed    Discharge instructions    Complete by:  As directed    -Call the Internal Medicine Center first thing Monday morning to get in appointment for Monday or Tuesday or as early as possible -Take your Lasix 40 mg (2 pills) once in the morning until you go to your doctor's appointment. Do not take any more in the afternoon for now. We sent a new prescription with updated instructions to the pharmacy on file in case you need more lasix  --Take your weight each day and try to keep it around 120 lbs. If it too high (closer to 130), you may too much fluid and can call the clinic for advice on how to take more Lasix and limit drinking fluid. If it is too low (closer to 110) you may not have enough fluid and can call on how to hold your lasix or drink more.  -Also, do not take your Lisinopril for now until you see  your doctor and they can check to see how your kidneys are doing   Increase activity slowly    Complete by:  As directed       Signed: Tawny Asal, MD 11/19/2016, 1:23 PM   Pager: [phone]

## 2016-11-17 NOTE — Progress Notes (Signed)
   Subjective: No acute events overnight. [name] says she continues to feel generally poor compared to her baseline. Also continues to feel light-headed, especially with standing though she has not attempted to ambulate.    Objective:  Vital signs in last 24 hours: Vitals:   11/16/16 1600 11/16/16 1645 11/16/16 1901 11/17/16 0555  BP: 113/66 127/66 113/83 116/82  Pulse: 93 91 99 63  Resp: (!) 22 (!) 22 (!) 24 18  Temp:  (!) 97.4 F (36.3 C) 97.6 F (36.4 C) (!) 97.3 F (36.3 C)  TempSrc:  Oral Oral   SpO2: 100% 98% 100% 98%  Weight:    121 lb 4.1 oz (55 kg)   Physical Exam  Constitutional:  Elderly women resting in bed, no acute distress   HENT:  Head: Normocephalic and atraumatic.  Mouth/Throat: Oropharynx is clear and moist.  Eyes: Pupils are equal, round, and reactive to light.  Neck: Neck supple. No tracheal deviation present.  Cardiovascular:  Irregularly irregular with normal rate, otherwise normal heart sounds  Pulmonary/Chest: Effort normal and breath sounds normal.  Abdominal: Soft. She exhibits no distension. There is no tenderness.  Musculoskeletal: She exhibits no edema.  Skin: Skin is warm and dry.     Assessment/Plan:  Active Problems:   Chronic combined systolic and diastolic CHF, NYHA class 2 (HCC)   Hypertension   Atrial fibrillation, controlled (HCC)   CKD (chronic kidney disease) stage 3, GFR 30-59 ml/min   Dehydration  Dehydration secondary to diuretic therapy, Chronic combined systolic and diastolic heart failure Pt with history of CHF with systolic and diastolic dysfunction- last echo April 2018 showed LV EF 45-50% along with RV dilation and systolic dysfunction, increased PA pressure. Recent hospitalization 7/19-7/22 for HF exacerbation with medication non-adherence. She was likely over diuresed on her home regimen (taking 60 mg am, 40 mg pm) resulting in orthostatic symptoms and clinical dehydration. Improving with holding diuretics and  gentle hydration. Will need to reassess diuretic regimen and target weight, also address disposition as she will likely benefit from increased assistance to manage her health.  --Discontinue IVF, cont to hold home diuretics and reassess --Strict I/Os, daily weight, Heart diet  --BMP --PT/OT, SW evaluations   Atrial fibrillation, rate controlled Pt is rate controlled on home regimen of metoprolol 100 mg BID, is not on anti-coagulation due to history of GI bleeding. Presented with tachycardia in setting of dehydration and missed metoprolol dose. Responded to beta blocker and currently rate controlled.  --Continue home Metoprolol 100 mg BID, ASA 81 mg  Chronic Kidney Disease, stage 3, Acute Kidney Injury secondary to dehydration Baseline Cr likely ~1.5, presented with increased Cr to 2.07 on presentation likely due to pre-renal azotemia in setting of over diuresis. Improving with treatment as above.   Anemia, chronic Pt with normocytic anemia (Hgb 8.4) which has been steadily declining over the past several months, also low to borderline white cell and platelet counts. Normal iron profile. May be related to medical kidney disease, though a myelodysplastic process or myeloma has not been evaluated.  --CBC with diff and smear review, monitor --Multiple myeloma panel- SPEP, IFE and QIG, Kappa/lambda light chain ratio    Dispo: Anticipated discharge in approximately 2 day(s).   Tawny Asal, MD 11/17/2016, 6:54 AM Pager: [phone]

## 2016-11-17 NOTE — Evaluation (Addendum)
Physical Therapy Evaluation Patient Details Name: [name] MRN: [id number] DOB: March 01, 1926 Today's Date: 11/17/2016   History of Present Illness  83 yo female with PMHx systolic congestive heart failure, sick sinus syndrome status post pacemaker, dementia, and osteoporosis presenting with a chief complaint of dizziness. The patient is accompanied by her two daughters, which much of the history was obtained from. The dizziness started about one week ago and was seen in the ED on 11/13/2016 for a fall. The patient states that she has "tunnel vision" dizziness when standing from a seated position. She also states that over all does not feel well, and complains of general weakness. The patient lives at home alone and does not cook for herself, and per daughters does not drink much water. She was recently discharged stable from the hospital on 11/06/2016 for dyspnea, volume overload, and acute on chronic systolic HF. She was diuresed and lost 22 lbs in 3 days. On discharge her Lasix dose was increased to 60 mg BID. Per patient's daughter's her Lasix regemin is 60 mg in the AM and 40 mg at night.  Past Medical History:  Diagnosis Date  . Adenomatous colon polyp    tubular  . Allergic rhinitis   . Anemia    Iron deficiency, does not tolerate po  . Arthritis    "all over my body"  . Atrial fibrillation (HCC)    on coumadin followed by Dr. Elie Confer  . CHF (congestive heart failure) (HCC)    diastolic dysfunction  . Constipation   . Dementia   . Diverticulosis of colon (without mention of hemorrhage)   . DJD (degenerative joint disease)   . GERD (gastroesophageal reflux disease)   . History of sick sinus syndrome s/p pacemaker Dr. Lovena Le 2000  . History of trichomonal urethritis   . History of vaginal bleeding 05/2005   nagative endometrial biopsy  . Hypertension    well controlled on 3 agents  . Incontinence overflow, stress female    on vesicare  . Onychomycosis   . Osteoporosis   .  Pacemaker   . Restrictive lung disease 1996   PFT's showed mild disease  . Stroke [facility] 2002   R MCA, cardioembolic  . Type II diabetes mellitus (HCC)    diet controlled  . Weight loss, unintentional 2010-2011   8/08-8/10 138-146 lbs, between 8/10-10/11 lost 18 lbs., work-up negative    Clinical Impression  Pt admitted with above diagnosis. Pt currently with functional limitations due to the deficits listed below (see PT Problem List). Pt was very confused today and not oriented.  BM on pad and pt urinated upon standing.  Pt needed total assist to clean up and to don gown and socks even though she states she can clean self by herself at home.  Pt lacks safety awareness and lacks judgment.  Pt needed min assist to ambulate with RW with risk of falls due to unsteady gait. Will follow acutely.  Feel a SNF stay would benefit pt.   Pt will benefit from skilled PT to increase their independence and safety with mobility to allow discharge to the venue listed below.      Follow Up Recommendations SNF;Supervision/Assistance - 24 hour    Equipment Recommendations  None recommended by PT    Recommendations for Other Services       Precautions / Restrictions Precautions Precautions: Fall Restrictions Weight Bearing Restrictions: No      Mobility  Bed Mobility Overal bed mobility: Needs Assistance Bed Mobility:  Supine to Sit     Supine to sit: Min assist     General bed mobility comments: incr time and used bedrails  Transfers Overall transfer level: Needs assistance Equipment used: Rolling walker (2 wheeled) Transfers: Sit to/from Stand Sit to Stand: Min assist         General transfer comment: VCs hand placement, min assist with min assist to steady with BM noted on pad.  Pt stated she was going to urinate and began urinating on floor.  Sat pt down and urine continued.  Had to clean pt, and change all linens including socks and gown.  Pt unaware of the safety risk and states she  could clean herself up but pt could not even reach down to her feet to assist with cleaning or get socks on.  Also washclothes very dirty from skin that was not soaked with urine.  Pt skin very dirty - unsure of hygeine at home.    Ambulation/Gait Ambulation/Gait assistance: Min assist Ambulation Distance (Feet): 20 Feet Assistive device: Rolling walker (2 wheeled) Gait Pattern/deviations: Step-through pattern;Decreased step length - right;Decreased step length - left;Shuffle;Drifts right/left;Trunk flexed   Gait velocity interpretation: Below normal speed for age/gender General Gait Details:  Verbal cues and min A to manage RW; Pt with overall poor safety awareness needing cues to stay close to RW and sequence steps and rW.  Pt needed cues for proper technique to approach chair and sit in chair.   Stairs            Wheelchair Mobility    Modified Rankin (Stroke Patients Only)       Balance Overall balance assessment: History of Falls;Needs assistance Sitting-balance support: No upper extremity supported;Feet supported Sitting balance-Leahy Scale: Good     Standing balance support: During functional activity;Bilateral upper extremity supported Standing balance-Leahy Scale: Poor Standing balance comment: Cannot maintain balance for toileting needing total assist to be cleaned with pt unaware she was actively having a BM.                              Pertinent Vitals/Pain Pain Assessment: No/denies pain    Home Living Family/patient expects to be discharged to:: Unsure Living Arrangements: Alone ("daughter spends the night sometimes") Available Help at Discharge: Family;Available PRN/intermittently Type of Home: House Home Access: Level entry     Home Layout: One level Home Equipment: Cane - single point;Bedside commode;Walker - 4 wheels;Walker - 2 wheels Additional Comments: No family present to confirm home set up or PLOF.  All info is from previous stay.   Of note, pt states she has a cane at home.     Prior Function Level of Independence: Independent with assistive device(s)         Comments: Pt at one point states she uses cane and later states she uses RW.       Hand Dominance   Dominant Hand: Right    Extremity/Trunk Assessment   Upper Extremity Assessment Upper Extremity Assessment: Defer to OT evaluation    Lower Extremity Assessment Lower Extremity Assessment: RLE deficits/detail;LLE deficits/detail RLE Deficits / Details: grossly 3-/5 LLE Deficits / Details: grossly 3-/5    Cervical / Trunk Assessment Cervical / Trunk Assessment: Normal  Communication   Communication: HOH  Cognition Arousal/Alertness: Awake/alert Behavior During Therapy: WFL for tasks assessed/performed Overall Cognitive Status: Impaired/Different from baseline Area of Impairment: Orientation;Attention;Following commands;Safety/judgement;Awareness;Problem solving;Memory  Orientation Level: Disoriented to;Place;Time;Situation Current Attention Level: Focused Memory: Decreased short-term memory Following Commands: Follows one step commands inconsistently;Follows one step commands with increased time Safety/Judgement: Decreased awareness of safety;Decreased awareness of deficits Awareness: Intellectual Problem Solving: Slow processing;Difficulty sequencing;Requires verbal cues;Requires tactile cues General Comments: oriented self only      General Comments General comments (skin integrity, edema, etc.): Pt incontinent and decr awareness of taking care of hygeine.  Pt could not follow directions for exercises therefore did very few.  Pt irritated telling PT to just leave her to eat her breakfast.  Noted pt had difficulty holding her cup.      Exercises General Exercises - Lower Extremity Ankle Circles/Pumps: AROM;Both;10 reps;Supine Quad Sets: AROM;Both;10 reps;Supine   Assessment/Plan    PT Assessment Patient needs  continued PT services  PT Problem List Decreased activity tolerance;Decreased balance;Decreased mobility;Decreased knowledge of use of DME;Decreased cognition;Decreased strength;Decreased safety awareness       PT Treatment Interventions Gait training;DME instruction;Therapeutic activities;Therapeutic exercise;Patient/family education    PT Goals (Current goals can be found in the Care Plan section)  Acute Rehab PT Goals Patient Stated Goal: to get better PT Goal Formulation: With patient Time For Goal Achievement: 12/01/16 Potential to Achieve Goals: Good    Frequency Min 3X/week   Barriers to discharge Decreased caregiver support      Co-evaluation               AM-PAC PT "6 Clicks" Daily Activity  Outcome Measure Difficulty turning over in bed (including adjusting bedclothes, sheets and blankets)?: A Little Difficulty moving from lying on back to sitting on the side of the bed? : A Little Difficulty sitting down on and standing up from a chair with arms (e.g., wheelchair, bedside commode, etc,.)?: A Little Help needed moving to and from a bed to chair (including a wheelchair)?: A Little Help needed walking in hospital room?: A Little Help needed climbing 3-5 steps with a railing? : A Lot 6 Click Score: 17    End of Session Equipment Utilized During Treatment: Gait belt Activity Tolerance: Patient limited by fatigue Patient left: in chair;with call bell/phone within reach;with chair alarm set Nurse Communication: Mobility status PT Visit Diagnosis: History of falling (Z91.81);Difficulty in walking, not elsewhere classified (R26.2);Unsteadiness on feet (R26.81)    Time: 4627-0350 PT Time Calculation (min) (ACUTE ONLY): 33 min   Charges:   PT Evaluation $PT Eval Moderate Complexity: 1 Mod PT Treatments $Gait Training: 8-22 mins   PT G Codes:   PT G-Codes **NOT FOR INPATIENT CLASS** Functional Assessment Tool Used: AM-PAC 6 Clicks Basic Mobility Functional  Limitation: Mobility: Walking and moving around Mobility: Walking and Moving Around Current Status (K9381): At least 20 percent but less than 40 percent impaired, limited or restricted Mobility: Walking and Moving Around Goal Status [phone]): At least 1 percent but less than 20 percent impaired, limited or restricted    [place] [phone] (pager)   Denice Paradise 11/17/2016, 2:41 PM

## 2016-11-17 NOTE — Progress Notes (Signed)
Medicine attending: I examined this patient today together with resident physician Dr. Tawny Asal and I concur with his evaluation and management plan which we discussed together.  Please see separate attending admission note for complete details. Condition stable overnight.  Blood pressure running low.  Renal function improving with gentle hydration.  Holding diuretics.  Continue current management plan.

## 2016-11-17 NOTE — Progress Notes (Signed)
PT note Evaluation completed and full note to follow.  Pt with very poor safety awareness and confused.  Pt will need SNF and SW updated.  [place] [phone] (pager)

## 2016-11-17 NOTE — Care Management Obs Status (Signed)
[place] NOTIFICATION   Patient Details  Name: [name] MRN: [id number] Date of Birth: 10-12-1925   Medicare Observation Status Notification Given:       Marilu Favre, RN 11/17/2016, 10:08 AM

## 2016-11-17 NOTE — Progress Notes (Signed)
Initial Nutrition Assessment  DOCUMENTATION CODES:   Not applicable  INTERVENTION:   -Continue Ensure Enlive po TID, each supplement provides 350 kcal and 20 grams of protein  NUTRITION DIAGNOSIS:   Predicted suboptimal nutrient intake related to chronic illness (dementia) as evidenced by percent weight loss.  GOAL:   Patient will meet greater than or equal to 90% of their needs  MONITOR:   PO intake, Supplement acceptance, Labs, Weight trends, Skin, I & O's  REASON FOR ASSESSMENT:   Malnutrition Screening Tool    ASSESSMENT:   83 yo female with PMHx systolic congestive heart failure, sick sinus syndrome status post pacemaker, dementia, and osteoporosis presenting with a chief complaint of dizziness.  Pt admitted with dizziness and possible over-diuresis.   Case discussed with RN prior to visit, who reports pt ate a good breakfast (PO: 75%). and about 50% of Ensure. Pt provides limited hx secondary to dementia.   Spoke with pt, who was very agitated at time of visit. Pt refused to answer this RD's questions or allow RD to perform exam, despite attempts at being redirected. Pt stated "I don't want to talk to you. Get out of my way". RN assisted RD to orient pt; provided graham crackers as a snack and encouraged pt to consume Ensure drink.   Reviewed wt hx; noted an 8.3% wt loss over the past month, which is significant for time frame.   RD suspect malnutrition, however, unable to identify at this time.   Labs reviewed.   Diet Order:  Diet Heart Room service appropriate? Yes; Fluid consistency: Thin  Skin:  Reviewed, no issues  Last BM:  11/17/16  Height:   Ht Readings from Last 1 Encounters:  11/13/16 5\' 5"  (1.651 m)    Weight:   Wt Readings from Last 1 Encounters:  11/17/16 121 lb 4.1 oz (55 kg)    Ideal Body Weight:  56.8 kg  BMI:  Body mass index is 20.18 kg/m.  Estimated Nutritional Needs:   Kcal:  1200-1400  Protein:  55-70 grams  Fluid:   1.2-1.4 L  EDUCATION NEEDS:   Education needs no appropriate at this time  Dezi Schaner A. Jimmye Norman, RD, LDN, CDE Pager: [phone] After hours Pager: [phone]

## 2016-11-17 NOTE — Clinical Social Work Note (Addendum)
CSW met with pt in room to address consult for "Please evaluate for SNF placement; Skilled nursing facility placement." Pt oriented to self only, but answered my questions appropriately. Pt refusing SNF placement stating she wants to go home and that she has family living with and assisting her at home. Pt gave permission to contact any of her 8 children and listed the names to [place].   CSW left VM for Burman Riis [phone]). CSW spoke with dtr-[name] [phone]) who agreed with pt returning home stating that her grandson and fiance are living with pt and providing assistance with ADLs. [name] stated pt has a RN/service come out to the home also. [name] states CSW should contact sister-[name] as she is pt's POA.  CSW received call back from [name] confirming information provided by pt and [name]. [name] stated sending pt to a SNF would "take her out of this world" as she would decline quickly there. [name] stated that the grandson and his fiance are living in the home temporarily and assisting pt, but Ms. Russell and pt's other children visit her daily and assist with med management and any other needs. Pt also states pt has home health (possibly [facility]) to provide in-home care.   CSW spoke with [name] with the [facility] [phone]) to see if pt would be appropriate for program. [name] stated pt is appropriate medically, but does not have the insurance needed for the program. CSW updated RNCM Nira Conn and [employer]. CSW signing off as no further Social Work needs identified. Please reconsult if new Social Work needs arise.   [name], [place], [facility] [phone]

## 2016-11-17 NOTE — Progress Notes (Signed)
Was in room while MD's rounded on patient, I notified them that both her IV fluids and order for telemetry expire today. MD said I could let the fluids expire and that they would assess the need for tele and make adjustments to orders as necessary, will continue to follow.

## 2016-11-18 DIAGNOSIS — D61818 Other pancytopenia: Secondary | ICD-10-CM | POA: Diagnosis not present

## 2016-11-18 DIAGNOSIS — I5042 Chronic combined systolic (congestive) and diastolic (congestive) heart failure: Secondary | ICD-10-CM | POA: Diagnosis not present

## 2016-11-18 DIAGNOSIS — E860 Dehydration: Secondary | ICD-10-CM | POA: Diagnosis not present

## 2016-11-18 DIAGNOSIS — R42 Dizziness and giddiness: Secondary | ICD-10-CM | POA: Diagnosis not present

## 2016-11-18 DIAGNOSIS — I5082 Biventricular heart failure: Secondary | ICD-10-CM | POA: Diagnosis not present

## 2016-11-18 DIAGNOSIS — R195 Other fecal abnormalities: Secondary | ICD-10-CM | POA: Diagnosis not present

## 2016-11-18 DIAGNOSIS — I4891 Unspecified atrial fibrillation: Secondary | ICD-10-CM | POA: Diagnosis not present

## 2016-11-18 DIAGNOSIS — I130 Hypertensive heart and chronic kidney disease with heart failure and stage 1 through stage 4 chronic kidney disease, or unspecified chronic kidney disease: Secondary | ICD-10-CM | POA: Diagnosis not present

## 2016-11-18 DIAGNOSIS — N183 Chronic kidney disease, stage 3 (moderate): Secondary | ICD-10-CM | POA: Diagnosis not present

## 2016-11-18 LAB — BASIC METABOLIC PANEL
ANION GAP: 9 (ref 5–15)
BUN: 40 mg/dL — ABNORMAL HIGH (ref 6–20)
CALCIUM: 9.3 mg/dL (ref 8.9–10.3)
CO2: 28 mmol/L (ref 22–32)
CREATININE: 1.7 mg/dL — AB (ref 0.44–1.00)
Chloride: 106 mmol/L (ref 101–111)
GFR, EST AFRICAN AMERICAN: 30 mL/min — AB (ref >60)
GFR, EST NON AFRICAN AMERICAN: 26 mL/min — AB (ref >60)
Glucose, Bld: 160 mg/dL — ABNORMAL HIGH (ref 65–99)
Potassium: 4.3 mmol/L (ref 3.5–5.1)
SODIUM: 143 mmol/L (ref 135–145)

## 2016-11-18 LAB — CBC WITH DIFFERENTIAL/PLATELET
BASOS ABS: 0 10*3/uL (ref 0.0–0.1)
BASOS PCT: 1 %
EOS PCT: 2 %
Eosinophils Absolute: 0.1 10*3/uL (ref 0.0–0.7)
HCT: 26.3 % — ABNORMAL LOW (ref 36.0–46.0)
Hemoglobin: 8.5 g/dL — ABNORMAL LOW (ref 12.0–15.0)
LYMPHS PCT: 30 %
Lymphs Abs: 1.3 10*3/uL (ref 0.7–4.0)
MCH: 29.6 pg (ref 26.0–34.0)
MCHC: 32.3 g/dL (ref 30.0–36.0)
MCV: 91.6 fL (ref 78.0–100.0)
Monocytes Absolute: 0.5 10*3/uL (ref 0.1–1.0)
Monocytes Relative: 12 %
NEUTROS ABS: 2.5 10*3/uL (ref 1.7–7.7)
Neutrophils Relative %: 55 %
PLATELETS: 141 10*3/uL — AB (ref 150–400)
RBC: 2.87 MIL/uL — AB (ref 3.87–5.11)
RDW: 18.1 % — ABNORMAL HIGH (ref 11.5–15.5)
WBC: 4.4 10*3/uL (ref 4.0–10.5)

## 2016-11-18 LAB — KAPPA/LAMBDA LIGHT CHAINS
KAPPA, LAMDA LIGHT CHAIN RATIO: 1.4 (ref 0.26–1.65)
Kappa free light chain: 60.3 mg/L — ABNORMAL HIGH (ref 3.3–19.4)
LAMDA FREE LIGHT CHAINS: 43.1 mg/L — AB (ref 5.7–26.3)

## 2016-11-18 LAB — SAVE SMEAR

## 2016-11-18 MED ORDER — FUROSEMIDE 40 MG PO TABS
40.0000 mg | ORAL_TABLET | Freq: Every day | ORAL | Status: DC
  Administered 2016-11-18 – 2016-11-19 (×2): 40 mg via ORAL
  Filled 2016-11-18 (×2): qty 1

## 2016-11-18 NOTE — Evaluation (Addendum)
Occupational Therapy Evaluation Patient Details Name: [name] MRN: [id number] DOB: 1925-12-22 Today's Date: 11/18/2016    History of Present Illness 83 yo female with PMHx systolic congestive heart failure, sick sinus syndrome status post pacemaker, dementia, and osteoporosis presenting with a chief complaint of dizziness. Pt recently hospitalized for dyspnea, volume overload, and acute on chronic systolic HF; pt d/c on 1/60/1093.    Clinical Impression   Limited evaluation performed due pts cognitive status and refusal to participate. Pt required max assist for rolling in bed and repositioning; pt resisting therapists attempts at bed mobility. Pt with impaired cognition-decreased awareness of safety and her deficits, poor memory, and only oriented to person; unsure of baseline as no family is currently present. Per chart review; pt lives at home alone with family checking on her daily. At this point, feel pt is unsafe to d/c home without 24/7 supervision as pt is a very high fall and safety risk. Recommending SNF for follow up to maximize independence and safety with ADL and functional mobility. Pt would benefit from continued skilled OT To address established goals.    Follow Up Recommendations  SNF;Supervision/Assistance - 24 hour    Equipment Recommendations  Other (comment) (TBD)    Recommendations for Other Services       Precautions / Restrictions Precautions Precautions: Fall Restrictions Weight Bearing Restrictions: No      Mobility Bed Mobility Overal bed mobility: Needs Assistance Bed Mobility: Rolling Rolling: Max assist         General bed mobility comments: Pt resisting movement and rolling in bed for bed pad placement and repositioning.  Transfers                 General transfer comment: Pt refusing mobility    Balance                                           ADL either performed or assessed with clinical judgement    ADL Overall ADL's : Needs assistance/impaired                                       General ADL Comments: Pt refusing to participate in ADL or functional mobility. Pt noted to bring hand to face/mouth at bed level. Able to minimally assist with rolling in bed for repositioning and scooting up toward HOB.     Vision         Perception     Praxis      Pertinent Vitals/Pain Pain Assessment: No/denies pain     Hand Dominance     Extremity/Trunk Assessment Upper Extremity Assessment Upper Extremity Assessment: Generalized weakness   Lower Extremity Assessment Lower Extremity Assessment: Defer to PT evaluation       Communication Communication Communication: HOH   Cognition Arousal/Alertness: Lethargic Behavior During Therapy: Agitated Overall Cognitive Status: No family/caregiver present to determine baseline cognitive functioning Area of Impairment: Orientation;Following commands;Memory;Safety/judgement;Awareness;Problem solving                 Orientation Level: Disoriented to;Place;Time;Situation   Memory: Decreased short-term memory Following Commands: Follows one step commands inconsistently Safety/Judgement: Decreased awareness of safety;Decreased awareness of deficits Awareness: Intellectual Problem Solving: Slow processing;Difficulty sequencing;Requires verbal cues;Requires tactile cues General Comments: Pt easily agitated with attempts at mobility. Eyes closed  throughout majority of session.   General Comments       Exercises     Shoulder Instructions      Home Living Family/patient expects to be discharged to:: Unsure Living Arrangements: Alone Available Help at Discharge: Family;Available PRN/intermittently Type of Home: House Home Access: Level entry     Home Layout: One level     Bathroom Shower/Tub: Teacher, early years/pre: Standard     Home Equipment: Cane - single point;Bedside commode;Walker - 4  wheels;Walker - 2 wheels   Additional Comments: Information taken from chart review. Pt unable to provide and no family present.      Prior Functioning/Environment Level of Independence: Independent with assistive device(s)        Comments: Information taken from chart review        OT Problem List: Decreased strength;Decreased range of motion;Decreased activity tolerance;Impaired balance (sitting and/or standing);Decreased cognition;Decreased safety awareness;Decreased knowledge of use of DME or AE;Decreased knowledge of precautions      OT Treatment/Interventions: Self-care/ADL training;Therapeutic exercise;DME and/or AE instruction;Energy conservation;Therapeutic activities;Patient/family education;Balance training;Cognitive remediation/compensation    OT Goals(Current goals can be found in the care plan section) Acute Rehab OT Goals Patient Stated Goal: "leave me alone" OT Goal Formulation: With patient Time For Goal Achievement: 12/02/16 Potential to Achieve Goals: Fair ADL Goals Pt Will Perform Grooming: with supervision;sitting Pt Will Perform Upper Body Bathing: with supervision;sitting Pt Will Perform Lower Body Bathing: with supervision;sit to/from stand Pt Will Transfer to Toilet: with supervision;ambulating;regular height toilet Pt Will Perform Toileting - Clothing Manipulation and hygiene: with supervision;sit to/from stand  OT Frequency: Min 2X/week   Barriers to D/C: Decreased caregiver support  Appears pt lives alone       Co-evaluation              AM-PAC PT "6 Clicks" Daily Activity     Outcome Measure Help from another person eating meals?: A Lot Help from another person taking care of personal grooming?: A Lot Help from another person toileting, which includes using toliet, bedpan, or urinal?: A Lot Help from another person bathing (including washing, rinsing, drying)?: A Lot Help from another person to put on and taking off regular upper body  clothing?: A Lot Help from another person to put on and taking off regular lower body clothing?: A Lot 6 Click Score: 12   End of Session Nurse Communication: Mobility status  Activity Tolerance: Patient limited by lethargy;Other (comment) (cognition) Patient left: in bed;with call bell/phone within reach;with bed alarm set  OT Visit Diagnosis: Unsteadiness on feet (R26.81);Other abnormalities of gait and mobility (R26.89);Muscle weakness (generalized) (M62.81);History of falling (Z91.81)                Time: 2426-8341 OT Time Calculation (min): 11 min Charges:  OT General Charges $OT Visit: 1 Procedure OT Evaluation $OT Eval Moderate Complexity: 1 Procedure G-Codes: OT G-codes **NOT FOR INPATIENT CLASS** Functional Assessment Tool Used: AM-PAC 6 Clicks Daily Activity Functional Limitation: Self care Self Care Current Status (D6222): At least 60 percent but less than 80 percent impaired, limited or restricted Self Care Goal Status (L7989): At least 20 percent but less than 40 percent impaired, limited or restricted   Mel Almond A. Ulice Brilliant, M.S., OTR/L Pager: [phone] 11/18/2016, 10:46 AM

## 2016-11-18 NOTE — Progress Notes (Signed)
The pt has been resumed on oral PO diuretic regimen after coming in dehydrated due to over-diuresis. She has recently been admitted within the past 30 days for acute CHF exacerbation due to difficulty with her diuretic regimen and failing outpatient management of diuresis. She was also recently seen in the ED after a fall. These previous issues, as well as current admission have stemmed from the difficulty in managing her volume status and optimizing her diuretic regimen. We feel she needs and is appropriate for continued monitoring in the inpatient setting in order to carefully monitor her renal function and HF status while establishing a diuretic regimen and target dry weight that will prevent further recurrent admissions.

## 2016-11-18 NOTE — Progress Notes (Signed)
Medicine attending: I personally examined this patient today together with resident physician Dr. Tawny Asal and I concur with his assessment and management plan which we discussed together.  This will be subsequently detailed in his progress note. Elderly lady with biventricular heart failure, atrial fibrillation, sick sinus syndrome status post pacemaker implant admitted with dizziness and lightheadedness and found to be dehydrated.  She was taking large doses of diuretics for her heart failure.  She had a recent hospital admission with discharge weight down to 112 pounds and estimated dry weight of 120 pounds.  Weight on current admission 120 but BUN and creatinine up from her baseline.  She already has stage III renal insufficiency.  Concomitant with deterioration in renal function, her chronic anemia worsened as well. We are holding diuretics.  Gentle hydration.  Renal function improving back close to her baseline.  BUN 40 creatinine 1.7 today.  Weight approximately 119 pounds.  We will cautiously resume diuretics at a lower dose. She has mild dementia.  Physical and Occupational Therapy recommends SNF placement however she has a very supportive family.  Dr. Johny Chess discussed discharge plans with them.  They would prefer to have her at home.  I thought that she was a [facility]  patient.  This was incorrect.  It would be optimal if a healthcare team could visit her home after discharge to assure compliance with medication.

## 2016-11-18 NOTE — Progress Notes (Signed)
Subjective: No acute events overnight. She notes an episode of SOB at some point this morning, states it resolved spontaneously and currently has no complaints.   After discussions between the patient, family, and case management and social work, the pt and her family do not wish to have Ms. Wince placed in a SNF or other facility or home health. She has help at home with family members living in her home, and other adult children checking on her daily. She is also followed by [facility] who visits her home. The daughter states that advanced directives have been discussed between the patient and her family.     Objective:  Vital signs in last 24 hours: Vitals:   11/17/16 0853 11/17/16 1500 11/17/16 2120 11/18/16 0455  BP: (!) 120/52 120/85 124/76 122/74  Pulse: 70 70 68 72  Resp:  _0 Temp:  (!) 97 F (36.1 C) 97.9 F (36.6 C) 98 F (36.7 C)  TempSrc:  Oral Axillary Axillary  SpO2:  98% 97% 96%  Weight:    118 lb 13.3 oz (53.9 kg)   Physical Exam  Constitutional:  Elderly woman resting in bed, no acute distress   HENT:  Head: Normocephalic and atraumatic.  Mouth/Throat: Oropharynx is clear and moist.  Eyes: Pupils are equal, round, and reactive to light.  Neck: Neck supple. No tracheal deviation present.  Cardiovascular:  Irregularly irregular with normal rate, otherwise normal heart sounds  Pulmonary/Chest: Effort normal and breath sounds normal.  Abdominal: Soft. She exhibits no distension. There is no tenderness.  Musculoskeletal: She exhibits no edema.  Skin: Skin is warm and dry.     Assessment/Plan:  Active Problems:   Chronic combined systolic and diastolic CHF, NYHA class 2 (HCC)   Hypertension   Atrial fibrillation, controlled (HCC)   CKD (chronic kidney disease) stage 3, GFR 30-59 ml/min   Dehydration   Hypertensive heart and kidney disease with biventricular heart failure and stage 3 chronic kidney disease (HCC)   Lightheadedness  Occult blood in stools   Other pancytopenia (HCC)  Dehydration secondary to diuretic therapy, Chronic combined systolic and diastolic heart failure Pt with history of CHF with systolic and diastolic dysfunction- last echo April 2018 showed LV EF 45-50% along with RV dilation and systolic dysfunction, increased PA pressure. Recent hospitalization 7/19-7/22 for HF exacerbation with medication non-adherence. She was likely over diuresed on her home regimen (taking 60 mg am, 40 mg pm) resulting in orthostatic symptoms and clinical dehydration. Improving with holding diuretics and gentle hydration. She will likely need a lower diuretic regimen if she remains adherent and an updated target weight of around 120 lbs given her current euvolumic and asymptomatic status.   --Resume diuretic regimen with Lasix 40 mg PO and assess response  --Strict I/Os, daily weight, Heart diet  --BMP    Atrial fibrillation, rate controlled Pt is rate controlled on home regimen of metoprolol 100 mg BID, is not on anti-coagulation due to history of GI bleeding. Presented with tachycardia in setting of dehydration and missed metoprolol dose. Responded to beta blocker and currently rate controlled.  --Continue home Metoprolol 100 mg BID, ASA 81 mg  Chronic Kidney Disease, stage 3, Acute Kidney Injury secondary to dehydration Baseline Cr likely ~1.5, presented with increased Cr to 2.07 on presentation likely due to pre-renal azotemia in setting of over diuresis. Improving with treatment as above.   Anemia, chronic Pt with normocytic anemia (Hgb 8.4) which has been steadily declining over  the past several months, also low to borderline white cell and platelet counts. Normal iron profile not indicative of IDA or ACD. May be related to medical kidney disease with decline in renal function, though a myelodysplastic process or myeloma has not been evaluated. Stable CBC with diff compared to prior, smear with dimorphic red cell  population, unremarkable but decreased white cell population, normal plt number and morphology- nonspecific findings reassuring for more severe underlying heme process.  --Multiple myeloma panel- SPEP, IFE and QIG, Kappa/lambda light chain ratio labs pending    Dispo: Anticipated discharge in approximately 1 day(s).   Tawny Asal, MD 11/18/2016, 6:59 AM Pager: [phone]

## 2016-11-19 DIAGNOSIS — N179 Acute kidney failure, unspecified: Secondary | ICD-10-CM | POA: Diagnosis not present

## 2016-11-19 DIAGNOSIS — E860 Dehydration: Secondary | ICD-10-CM | POA: Diagnosis not present

## 2016-11-19 DIAGNOSIS — D61818 Other pancytopenia: Secondary | ICD-10-CM | POA: Diagnosis not present

## 2016-11-19 DIAGNOSIS — I5043 Acute on chronic combined systolic (congestive) and diastolic (congestive) heart failure: Secondary | ICD-10-CM

## 2016-11-19 DIAGNOSIS — N183 Chronic kidney disease, stage 3 (moderate): Secondary | ICD-10-CM | POA: Diagnosis not present

## 2016-11-19 DIAGNOSIS — I4891 Unspecified atrial fibrillation: Secondary | ICD-10-CM

## 2016-11-19 DIAGNOSIS — T502X5A Adverse effect of carbonic-anhydrase inhibitors, benzothiadiazides and other diuretics, initial encounter: Secondary | ICD-10-CM | POA: Diagnosis not present

## 2016-11-19 DIAGNOSIS — Z79899 Other long term (current) drug therapy: Secondary | ICD-10-CM | POA: Diagnosis not present

## 2016-11-19 DIAGNOSIS — D649 Anemia, unspecified: Secondary | ICD-10-CM

## 2016-11-19 DIAGNOSIS — R195 Other fecal abnormalities: Secondary | ICD-10-CM

## 2016-11-19 DIAGNOSIS — I130 Hypertensive heart and chronic kidney disease with heart failure and stage 1 through stage 4 chronic kidney disease, or unspecified chronic kidney disease: Secondary | ICD-10-CM | POA: Diagnosis not present

## 2016-11-19 LAB — BASIC METABOLIC PANEL
ANION GAP: 9 (ref 5–15)
BUN: 39 mg/dL — ABNORMAL HIGH (ref 6–20)
CALCIUM: 8.9 mg/dL (ref 8.9–10.3)
CO2: 27 mmol/L (ref 22–32)
Chloride: 104 mmol/L (ref 101–111)
Creatinine, Ser: 1.81 mg/dL — ABNORMAL HIGH (ref 0.44–1.00)
GFR calc Af Amer: 28 mL/min — ABNORMAL LOW (ref >60)
GFR, EST NON AFRICAN AMERICAN: 24 mL/min — AB (ref >60)
GLUCOSE: 131 mg/dL — AB (ref 65–99)
Potassium: 4.6 mmol/L (ref 3.5–5.1)
Sodium: 140 mmol/L (ref 135–145)

## 2016-11-19 MED ORDER — FUROSEMIDE 20 MG PO TABS: 40.0000 mg | ORAL_TABLET | Freq: Every day | ORAL | 0 refills | Status: DC

## 2016-11-19 NOTE — Progress Notes (Addendum)
Discharge instructions gone over with two daughters and patient.patient is going home with [facility]. Home medications discussed. Follow up appointment is to be made. Prescription was electronically sent to patient's pharmacy. Diet and reasons to call the doctor were discussed. Heart falilure information was reinforced. Patient verbalized understanding of information.

## 2016-11-19 NOTE — Progress Notes (Signed)
[place] [phone] notified patient will be resuming services and returning home today. Spoke with bedside RN she will call family for transportation.

## 2016-11-19 NOTE — Progress Notes (Signed)
Internal Medicine Attending  Date: 11/19/2016  Patient name: [name] Medical record number: [id number] Date of birth: 03-10-1926 Age: 83 y.o. Gender: female  I saw and evaluated the patient. I reviewed the resident's note by Dr. Johny Chess and I agree with the resident's findings and plans as documented in his progress note.  When seen on rounds this morning she stated she was breathing well. Other than generalized weakness she was without specific complaints. I agree with the plan to discharge her home on a lower dose of oral Lasix assuming she remains compliant. The discharge dose will be 40 mg by mouth every morning.

## 2016-11-19 NOTE — Progress Notes (Signed)
Subjective: No acute events overnight. She denies difficulty breathing or edema. Responded well to Lasix with good reported and documented output over the last 24 hours.   Objective:  Vital signs in last 24 hours: Vitals:   11/18/16 1450 11/18/16 2130 11/19/16 0457 11/19/16 0500  BP: (!) 105/44 128/79 (!) 104/59   Pulse: 63 78 72   Resp: '16 18 17   '$ Temp: (!) 97.4 F (36.3 C) 97.7 F (36.5 C) (!) 97.5 F (36.4 C)   TempSrc: Oral Oral Axillary   SpO2: 96% 95% 93%   Weight:    116 lb 6.4 oz (52.8 kg)  Height:       Physical Exam  Constitutional:  Elderly woman resting in bed, no acute distress   HENT:  Head: Normocephalic and atraumatic.  Mouth/Throat: Oropharynx is clear and moist.  Eyes: Pupils are equal, round, and reactive to light.  Neck: Neck supple. No tracheal deviation present.  Cardiovascular:  Irregularly irregular with normal rate, otherwise normal heart sounds  Pulmonary/Chest: Effort normal and breath sounds normal.  Abdominal: Soft. She exhibits no distension. There is no tenderness.  Musculoskeletal: She exhibits no edema.  Skin: Skin is warm and dry.     Assessment/Plan:  Active Problems:   Chronic combined systolic and diastolic CHF, NYHA class 2 (HCC)   Hypertension   Atrial fibrillation, controlled (HCC)   CKD (chronic kidney disease) stage 3, GFR 30-59 ml/min   Hypertensive heart and kidney disease with biventricular heart failure and stage 3 chronic kidney disease (HCC)   Occult blood in stools   Other pancytopenia (HCC)  Dehydration secondary to diuretic therapy, Chronic combined systolic and diastolic heart failure Pt with history of CHF with systolic and diastolic dysfunction- last echo April 2018 showed LV EF 45-50% along with RV dilation and systolic dysfunction, increased PA pressure. Recent hospitalization 7/19-7/22 for HF exacerbation with medication non-adherence. She was likely over diuresed on her home regimen (taking 60 mg am, 40 mg  pm) resulting in orthostatic symptoms and clinical dehydration. Improved with holding diuretics and gentle hydration. She will likely need a lower diuretic regimen if she remains adherent and an updated target weight of around 120 lbs given her current euvolumic and asymptomatic status.   --Continue Lasix 40 mg PO   --Strict I/Os, daily weight, Heart diet  --BMP    Atrial fibrillation, rate controlled Pt is rate controlled on home regimen of metoprolol 100 mg BID, is not on anti-coagulation due to history of GI bleeding. Presented with tachycardia in setting of dehydration and missed metoprolol dose. Responded to beta blocker and currently rate controlled.  --Continue home Metoprolol 100 mg BID, ASA 81 mg  Chronic Kidney Disease, stage 3, Acute Kidney Injury secondary to dehydration Baseline Cr likely ~1.5, presented with increased Cr to 2.07 on presentation likely due to pre-renal azotemia in setting of over diuresis. Improving with treatment as above. Currently stable, 1.81    Anemia, chronic Pt with normocytic anemia (Hgb 8.4) which has been steadily declining over the past several months, also low to borderline white cell and platelet counts. Normal iron profile not indicative of IDA or ACD. May be related to medical kidney disease with decline in renal function, though a myelodysplastic process or myeloma has not been evaluated. Stable CBC with diff compared to prior, smear with dimorphic red cell population, unremarkable but decreased white cell population, normal plt number and morphology- nonspecific findings reassuring for more severe underlying heme process. Kappa/lambda light chain ratio wnl.   --  Multiple myeloma panel- SPEP, IFE and QIG pending    Dispo: Anticipated discharge today.   Tawny Asal, MD 11/19/2016, 9:46 AM Pager: [phone]

## 2016-11-21 LAB — MULTIPLE MYELOMA PANEL, SERUM
ALBUMIN SERPL ELPH-MCNC: 3.3 g/dL (ref 2.9–4.4)
ALPHA 1: 0.3 g/dL (ref 0.0–0.4)
Albumin/Glob SerPl: 1 (ref 0.7–1.7)
Alpha2 Glob SerPl Elph-Mcnc: 0.7 g/dL (ref 0.4–1.0)
B-GLOBULIN SERPL ELPH-MCNC: 1.2 g/dL (ref 0.7–1.3)
GLOBULIN, TOTAL: 3.5 g/dL (ref 2.2–3.9)
Gamma Glob SerPl Elph-Mcnc: 1.3 g/dL (ref 0.4–1.8)
IgA: 312 mg/dL (ref 64–422)
IgG (Immunoglobin G), Serum: 1328 mg/dL (ref 700–1600)
IgM, Serum: 147 mg/dL (ref 26–217)
TOTAL PROTEIN ELP: 6.8 g/dL (ref 6.0–8.5)

## 2016-11-23 ENCOUNTER — Ambulatory Visit (INDEPENDENT_AMBULATORY_CARE_PROVIDER_SITE_OTHER): Payer: Medicare HMO | Admitting: Internal Medicine

## 2016-11-23 ENCOUNTER — Encounter: Payer: Self-pay | Admitting: Internal Medicine

## 2016-11-23 VITALS — BP 123/57 | HR 67 | Temp 97.8°F | Wt 116.5 lb

## 2016-11-23 DIAGNOSIS — N183 Chronic kidney disease, stage 3 unspecified: Secondary | ICD-10-CM

## 2016-11-23 DIAGNOSIS — I129 Hypertensive chronic kidney disease with stage 1 through stage 4 chronic kidney disease, or unspecified chronic kidney disease: Secondary | ICD-10-CM | POA: Diagnosis not present

## 2016-11-23 DIAGNOSIS — Z79899 Other long term (current) drug therapy: Secondary | ICD-10-CM | POA: Diagnosis not present

## 2016-11-23 DIAGNOSIS — I130 Hypertensive heart and chronic kidney disease with heart failure and stage 1 through stage 4 chronic kidney disease, or unspecified chronic kidney disease: Secondary | ICD-10-CM

## 2016-11-23 DIAGNOSIS — I5023 Acute on chronic systolic (congestive) heart failure: Secondary | ICD-10-CM | POA: Diagnosis not present

## 2016-11-23 DIAGNOSIS — I10 Essential (primary) hypertension: Secondary | ICD-10-CM

## 2016-11-23 DIAGNOSIS — Z5189 Encounter for other specified aftercare: Secondary | ICD-10-CM | POA: Diagnosis not present

## 2016-11-23 NOTE — Progress Notes (Signed)
CC: For hospital follow-up, most recent admission because of hypotension.  HPI:  [name] is a 83 y.o. lady with past medical history as listed below came to the clinic for her hospital follow-up. She was admitted twice recently, first time from 10/27/2016 to 11/06/2016 due to CHF exacerbation. She had a combined systolic and diastolic heart failure with ejection fraction of 45-50%. She was diuresed in the hospital and discharged home on Lasix 60 mg in the morning and 40 in the afternoon. She was readmitted on 11/16/2016 till 11/19/2016, when she presented with dizziness and found to have positive orthostasis  and hypotensive, requiring gentle IV fluid resuscitation. Her Lasix was decreased to 40 mg daily during that visit and she was discharged home on the same dose. Her dry weight documented is around 120 pound.  Patient is feeling better since discharge. She was accompanied by her daughter who is also her caregiver. According to her daughter she is being able to eat and drink well and overall doing much better now. Her weight today was 116. According to Dr. her weight was 44 yesterday when home health nurse weigh her. Patient denies any shortness of breath, chest pain, orthopnea or PND. She denies any lower extremity edema. Her appetite is improving. She denies any diarrhea/constipation or any urinary symptoms.  Past Medical History:  Diagnosis Date  . Adenomatous colon polyp    tubular  . Allergic rhinitis   . Anemia    Iron deficiency, does not tolerate po  . Arthritis    "all over my body"  . Atrial fibrillation (HCC)    on coumadin followed by Dr. Elie Confer  . CHF (congestive heart failure) (HCC)    diastolic dysfunction  . Constipation   . Dementia   . Diverticulosis of colon (without mention of hemorrhage)   . DJD (degenerative joint disease)   . GERD (gastroesophageal reflux disease)   . History of sick sinus syndrome s/p pacemaker Dr. Lovena Le 2000  . History of  trichomonal urethritis   . History of vaginal bleeding 05/2005   nagative endometrial biopsy  . Hypertension    well controlled on 3 agents  . Incontinence overflow, stress female    on vesicare  . Onychomycosis   . Osteoporosis   . Pacemaker   . Restrictive lung disease 1996   PFT's showed mild disease  . Stroke [facility]) 2002   R MCA, cardioembolic  . Type II diabetes mellitus (HCC)    diet controlled  . Weight loss, unintentional 2010-2011   8/08-8/10 138-146 lbs, between 8/10-10/11 lost 18 lbs., work-up negative   Review of Systems:  As per HPI.  Physical Exam:  Vitals:   11/23/16 1035  BP: (!) 123/57  Pulse: 67  Temp: 97.8 F (36.6 C)  TempSrc: Oral  SpO2: 100%  Weight: 116 lb 8 oz (52.8 kg)   General: Vital signs reviewed.  Patient is well-developed elderly lady, in no acute distress and cooperative with exam.  Cardiovascular: RRR, S1 normal, S2 normal, no murmurs, gallops, or rubs. Pulmonary/Chest: Clear to auscultation bilaterally, no wheezes, rales, or rhonchi. Abdominal: Soft, non-tender, non-distended, BS +, no masses, organomegaly, or guarding present.  Extremities: No lower extremity edema bilaterally,  pulses symmetric and intact bilaterally. No cyanosis or clubbing. Skin: Warm, dry and intact. No rashes or erythema. Psychiatric: Normal mood and affect. speech and behavior is normal. Cognition and memory are normal.  Assessment & Plan:   See Encounters Tab for problem based charting.  Patient  discussed with Dr. Evette Doffing.

## 2016-11-23 NOTE — Patient Instructions (Signed)
Thank you for visiting clinic today. I'm glad that you are doing well. I will not make any changes to your medication at this time. Do not take lisinopril, as your blood pressure remained normal. We will check your kidney functions today, we will call you with any abnormal results. Continue to take Lasix 40 mg daily. Continue to weigh yourself daily. If you see an increase of 3 pound in one day or 5 pounds in one week please take Her dose of Lasix. Follow-up in 4 weeks with your PCP.

## 2016-11-24 LAB — BMP8+ANION GAP
Anion Gap: 17 mmol/L (ref 10.0–18.0)
BUN / CREAT RATIO: 17 (ref 12–28)
BUN: 33 mg/dL — ABNORMAL HIGH (ref 8–27)
CALCIUM: 9.4 mg/dL (ref 8.7–10.3)
CO2: 25 mmol/L (ref 20–29)
Chloride: 97 mmol/L (ref 96–106)
Creatinine, Ser: 1.99 mg/dL — ABNORMAL HIGH (ref 0.57–1.00)
GFR, EST AFRICAN AMERICAN: 25 mL/min/{1.73_m2} — AB (ref >59)
GFR, EST NON AFRICAN AMERICAN: 22 mL/min/{1.73_m2} — AB (ref >59)
GLUCOSE: 113 mg/dL — AB (ref 65–99)
POTASSIUM: 4.1 mmol/L (ref 3.5–5.2)
SODIUM: 139 mmol/L (ref 134–144)

## 2016-11-24 NOTE — Assessment & Plan Note (Signed)
She has worsening of renal function on BMP done in clinic on 11/23/2016. Most likely because of dehydration due to Lasix can decreased fluid intake. Her daughter was asking about the maximum fluid intake she can take in the day. I told her there are not much restrictions. She should take at least 1500 mL of water, to keep her rehydrated.  -Repeat BMP during next follow-up visit.

## 2016-11-24 NOTE — Progress Notes (Signed)
Internal Medicine Clinic Attending  Case discussed with Dr. Amin at the time of the visit.  We reviewed the resident's history and exam and pertinent patient test results.  I agree with the assessment, diagnosis, and plan of care documented in the resident's note.    

## 2016-11-24 NOTE — Assessment & Plan Note (Signed)
Her acute exacerbation has been resolved. Patient is below her documented dry weight of 120 pounds. Her weight today was 116 pound. She was normotensive and denies any more dizziness.  Patient is doing well on 40 mg daily of Lasix.  I will continue current dose of Lasix at 40 mg daily. Her daughter was reinstructed to keep weighing her daily, she was advised to add an extra dose of Lasix if her weight increases by 3 pound in one day or 5 pounds in 1 week.

## 2016-11-24 NOTE — Assessment & Plan Note (Signed)
BP Readings from Last 3 Encounters:  11/23/16 (!) 123/57  11/19/16 (!) 118/59  11/13/16 135/60   She was normotensive today. She denies any symptoms of being dizzy. Her lisinopril was discontinued during previous hospitalization because of her hypertension and positive orthostasis. Her blood pressure is maintained on Lasix 40 mg daily and Lopressor 100 mg twice daily.  There is no need to restart lisinopril at this time. We can reassess her during next follow-up visit with PCP.

## 2016-11-25 ENCOUNTER — Other Ambulatory Visit: Payer: Self-pay | Admitting: *Deleted

## 2016-11-25 DIAGNOSIS — I5032 Chronic diastolic (congestive) heart failure: Secondary | ICD-10-CM

## 2016-11-25 MED ORDER — FUROSEMIDE 20 MG PO TABS: 40.0000 mg | ORAL_TABLET | Freq: Every day | ORAL | 0 refills | Status: DC

## 2016-11-29 ENCOUNTER — Encounter: Payer: Self-pay | Admitting: Internal Medicine

## 2016-12-09 ENCOUNTER — Emergency Department (HOSPITAL_COMMUNITY)
Admission: EM | Admit: 2016-12-09 | Discharge: 2016-12-09 | Disposition: A | Discharge status: Home / Self Care | Payer: Medicare HMO | Attending: Emergency Medicine | Admitting: Emergency Medicine

## 2016-12-09 ENCOUNTER — Encounter (HOSPITAL_COMMUNITY): Payer: Self-pay | Admitting: Emergency Medicine

## 2016-12-09 ENCOUNTER — Other Ambulatory Visit: Payer: Self-pay | Admitting: Licensed Clinical Social Worker

## 2016-12-09 DIAGNOSIS — F0390 Unspecified dementia without behavioral disturbance: Secondary | ICD-10-CM | POA: Diagnosis not present

## 2016-12-09 DIAGNOSIS — H5710 Ocular pain, unspecified eye: Secondary | ICD-10-CM | POA: Diagnosis not present

## 2016-12-09 DIAGNOSIS — Z79899 Other long term (current) drug therapy: Secondary | ICD-10-CM | POA: Diagnosis not present

## 2016-12-09 DIAGNOSIS — I5032 Chronic diastolic (congestive) heart failure: Secondary | ICD-10-CM | POA: Insufficient documentation

## 2016-12-09 DIAGNOSIS — Z8673 Personal history of transient ischemic attack (TIA), and cerebral infarction without residual deficits: Secondary | ICD-10-CM | POA: Insufficient documentation

## 2016-12-09 DIAGNOSIS — Z7982 Long term (current) use of aspirin: Secondary | ICD-10-CM | POA: Diagnosis not present

## 2016-12-09 DIAGNOSIS — I4891 Unspecified atrial fibrillation: Secondary | ICD-10-CM | POA: Diagnosis not present

## 2016-12-09 DIAGNOSIS — Z7901 Long term (current) use of anticoagulants: Secondary | ICD-10-CM | POA: Diagnosis not present

## 2016-12-09 DIAGNOSIS — I130 Hypertensive heart and chronic kidney disease with heart failure and stage 1 through stage 4 chronic kidney disease, or unspecified chronic kidney disease: Secondary | ICD-10-CM | POA: Insufficient documentation

## 2016-12-09 DIAGNOSIS — E119 Type 2 diabetes mellitus without complications: Secondary | ICD-10-CM | POA: Diagnosis not present

## 2016-12-09 DIAGNOSIS — H5789 Other specified disorders of eye and adnexa: Secondary | ICD-10-CM

## 2016-12-09 DIAGNOSIS — H578 Other specified disorders of eye and adnexa: Secondary | ICD-10-CM | POA: Diagnosis not present

## 2016-12-09 DIAGNOSIS — N183 Chronic kidney disease, stage 3 (moderate): Secondary | ICD-10-CM | POA: Insufficient documentation

## 2016-12-09 MED ORDER — TETRACAINE HCL 0.5 % OP SOLN
1.0000 [drp] | Freq: Once | OPHTHALMIC | Status: DC
  Filled 2016-12-09: qty 4

## 2016-12-09 MED ORDER — POLYMYXIN B-TRIMETHOPRIM 10000-0.1 UNIT/ML-% OP SOLN: 1.0000 [drp] | OPHTHALMIC | 0 refills | Status: DC

## 2016-12-09 MED ORDER — FLUORESCEIN SODIUM 0.6 MG OP STRP
1.0000 | ORAL_STRIP | Freq: Once | OPHTHALMIC | Status: DC
  Filled 2016-12-09: qty 1

## 2016-12-09 NOTE — Discharge Instructions (Signed)
We are concerned you have conjunctivitis. Take the prescribed medication as directed.  Keep eyes clean with warm washcloth.  Make sure to wash your sheets, towels, etc. Often to prevent recurrence until eyes completely heal. Follow-up with your primary care doctor.  Also recommend that you see an eye doctor, especially if this is not improving. Return to the ED for new or worsening symptoms.

## 2016-12-09 NOTE — ED Triage Notes (Signed)
Pt reports eye pain and redness x2 days, pts eyes are bloodshot in triage. Denies being around anyone with similar symptoms. Has used warm compresses at home

## 2016-12-09 NOTE — Patient Outreach (Signed)
Assessment:  CSW was able to make initial contact with patient today to perform CSW screening with [employer] patients with acuity level 4 or 5.  CSW introduced self, explained role and types of services provided through [facility] ([facility]).  CSW further explained to patient that CSW wants to ensure that patient has all their needs met, prior to returning home.  CSW obtained two HIPAA compliant identifiers from patient, which included patient's name and date of birth. The following CSW screening was performed: The reason for patient's visit to the emergency department was due to pain in her eyes, drainage from her eyes and redness in her eyes. She said she did have support in the home with [facility]. She said she began receiving services with [facility] about one week ago. She has a primary care provider. Her primary care provider is Dr. Ellen Mullins.  [name] said she saw Dr. Mullins for a medical appointment last week. [name] said she is scheduled to have her next appointment with Dr. Mulliins on 12/28/16.  [name] said she sees Dr. Mullins at the [facility]. [name] said that she had no barriers to keep her from attending appointments with her primary care provider.  She said she was able to have her medications filled and family members picked up her prescribed medications for her.  She said she had no barriers related to getting her medications filled or picked up.  She said she did not visit an urgent care center before coming to the Emergency Department .  She does have difficulties  caring for herself at home.  She is under care with [facility].  At home she receives additional help from her adult daughters. She has six adult daughters who assist also with her care needs.  She did not state any barriers to prevent a re-admission to the hospital or prevent her from coming back to the  Emergency Department.  CSW gave client [facility] welcome/information packet. CSW gave [name] [facility] CSW card.  CSW informed her of [facility] program services in nursing, pharmacy and social work. [name] said she appreciated information and would read over information about [facility] . She is receiving care with [facility] and thus may not be eligible for [facility] care at this time . [name] was appreciative of visit with CSW at Emergency Department on 12/09/16.   S. MSW, LCSW Licensed Clinical Social Worker [facility] [phone]         CSW provided patient with a [facility] Welcome Packet, further explaining the program. Patient made aware that [facility] services does not interfere or replace home health or other community based services.  

## 2016-12-09 NOTE — ED Provider Notes (Signed)
[facility] DEPT Provider Note   CSN: [id number] Arrival date & time: 12/09/16  4034     History   Chief Complaint Chief Complaint  Patient presents with  . Eye Problem    HPI SHATONIA HOOTS is a 83 y.o. female.  The history is provided by the patient and medical records.  Eye Problem   Associated symptoms include eye redness.     83 year old female with history of allergic rhinitis, arthritis, A. fib on Coumadin, CHF, dementia, GERD, hypertension, diabetes, presenting to the ED with eye pain and redness. Patient reports this began on Sunday and has been worsening since.  States eye become increasingly more red. States in the morning she has to "peel her eyes open". States she feels like there is a "film" over her eye. She denies any changes in vision. States she used to work glasses for reading but does not wear them anymore. No corrective lenses. Patient does get regular eye exams.  No hx of cataracts or glaucoma. Denies any trauma or chemical exposure to the eye. Patient reports she was recently around her grandson who has been sick with a cold. Unsure if he had pinkeye as well.  Past Medical History:  Diagnosis Date  . Adenomatous colon polyp    tubular  . Allergic rhinitis   . Anemia    Iron deficiency, does not tolerate po  . Arthritis    "all over my body"  . Atrial fibrillation (HCC)    on coumadin followed by Dr. Elie Confer  . CHF (congestive heart failure) (HCC)    diastolic dysfunction  . Constipation   . Dementia   . Diverticulosis of colon (without mention of hemorrhage)   . DJD (degenerative joint disease)   . GERD (gastroesophageal reflux disease)   . History of sick sinus syndrome s/p pacemaker Dr. Lovena Le 2000  . History of trichomonal urethritis   . History of vaginal bleeding 05/2005   nagative endometrial biopsy  . Hypertension    well controlled on 3 agents  . Incontinence overflow, stress female    on vesicare  . Onychomycosis   . Osteoporosis     . Pacemaker   . Restrictive lung disease 1996   PFT's showed mild disease  . Stroke [facility]) 2002   R MCA, cardioembolic  . Type II diabetes mellitus (HCC)    diet controlled  . Weight loss, unintentional 2010-2011   8/08-8/10 138-146 lbs, between 8/10-10/11 lost 18 lbs., work-up negative    Patient Active Problem List   Diagnosis Date Noted  . Hypertensive heart and kidney disease with biventricular heart failure and stage 3 chronic kidney disease ([place])   . Occult blood in stools   . Other pancytopenia ()   . Acute on chronic systolic heart failure ([place]) 11/03/2016  . Knee osteoarthritis 10/12/2016  . Primary pulmonary HTN ([place])   . Venous (peripheral) insufficiency   . Acute kidney injury ([place]) 03/16/2016  . Malaise and fatigue 03/16/2016  . Hyperglycemia 10/07/2015  . Fecal incontinence due to anorectal disorder 11/06/2014  . Health care maintenance 02/27/2014  . Malnutrition of moderate degree ([place]) 01/22/2014  . Anemia 01/21/2014  . CKD (chronic kidney disease) stage 3, GFR 30-59 ml/min 01/21/2014  . Allergic rhinitis 10/16/2013  . Thrombocytopenia ([place]) 09/08/2013  . Decreased hearing 08/23/2012  . At high risk for falls 08/10/2012  . Age-related cognitive decline 08/10/2012  . Normochromic anemia 05/15/2012  . GERD (gastroesophageal reflux disease) 06/15/2011  . Pacemaker-Medtronic 05/23/2011  . Chronic  combined systolic and diastolic CHF, NYHA class 2 ([place])   . Hypertension   . Osteoporosis   . History of stroke   . Atrial fibrillation, controlled ([place])   . Incontinence overflow, stress female   . History of sick sinus syndrome   . B12 deficiency 11/05/2009  . Constipation 10/30/2009    Past Surgical History:  Procedure Laterality Date  . ABDOMINAL HYSTERECTOMY    . CATARACT EXTRACTION    . CHOLECYSTECTOMY    . COLONOSCOPY    . ESOPHAGOGASTRODUODENOSCOPY    . INSERT / REPLACE / REMOVE PACEMAKER  2000   MDT Sigma pacemaker implanted by Dr Lovena Le;  Marland Kitchen  PACEMAKER GENERATOR CHANGE N/A 11/02/2012   Procedure: PACEMAKER GENERATOR CHANGE;  Surgeon: Evans Lance, MD;  Location[facility] C[facility] LAB;  Service: Cardiovascular;  Laterality: N/A;    OB History    No data available       Home Medications    Prior to Admission medications   Medication Sig Start Date End Date Taking? Authorizing Provider  aspirin EC 81 MG tablet Take 81 mg by mouth daily.    [provider]  docusate sodium (COLACE) 100 MG capsule Take 1 capsule (100 mg total) by mouth daily as needed. Patient taking differently: Take 100 mg by mouth daily as needed for mild constipation.  06/20/16 06/20/17  Norman Herrlich, MD  furosemide (LASIX) 20 MG tablet Take 2 tablets (40 mg total) by mouth daily. 11/25/16   Sid Falcon, MD  loratadine (CLARITIN) 5 MG chewable tablet Chew 1 tablet (5 mg total) by mouth daily. 08/10/16   Rivet, Sindy Guadeloupe, MD  metoprolol tartrate (LOPRESSOR) 100 MG tablet TAKE 1 TABLET(100 MG) BY MOUTH TWICE DAILY 10/24/16   Sid Falcon, MD  Nutritional Supplements ([place]) LIQD Take 237 mLs by mouth 3 (three) times daily. 12/19/14   Zada Finders, MD  pantoprazole (PROTONIX) 40 MG tablet Take 1 tablet (40 mg total) by mouth daily. 08/10/16   Rivet, Sindy Guadeloupe, MD  polyethylene glycol (MIRALAX / GLYCOLAX) packet Take 17 g by mouth daily as needed for mild constipation. 03/20/16   Maryellen Pile, MD  potassium chloride 20 MEQ/15ML (10%) SOLN TAKE 15 MLS BY MOUTH DAILY 09/02/16   Sid Falcon, MD    Family History Family History  Problem Relation Age of Onset  . Diabetes Sister   . Stroke Sister   . Heart disease Unknown   . Heart disease Father     Social History Social History  Substance Use Topics  . Smoking status: Never Smoker  . Smokeless tobacco: Never Used  . Alcohol use 1.2 oz/week    2 Cans of beer per week     Allergies   Patient has no known allergies.   Review of Systems Review of Systems  Eyes: Positive for pain  and redness.  All other systems reviewed and are negative.    Physical Exam Updated Vital Signs BP 117/71 (BP Location: Right Arm)   Pulse 99   Temp 98 F (36.7 C)   Resp 17   Ht 5' (1.524 m)   Wt 53.5 kg (118 lb)   SpO2 100%   BMI 23.05 kg/m   Physical Exam  Constitutional: She is oriented to person, place, and time. She appears well-developed and well-nourished.  HENT:  Head: Normocephalic and atraumatic.  Mouth/Throat: Oropharynx is clear and moist.  Eyes: Pupils are equal, round, and reactive to light. Conjunctivae and EOM are  normal.  No lid edema or erythema, both conjunctiva are injected, does appear to be some drainage in bilateral medial canthi; there is some crusting of the upper and lower lash lines, EOMs are fully intact, no nystagmus, no field cuts, no evidence of foreign body or signs of trauma; no hemorrhage or hypopyon; pupils are regular shaped, round and reactive bilaterally  Neck: Normal range of motion.  Cardiovascular: Normal rate, regular rhythm and normal heart sounds.   Pulmonary/Chest: Effort normal and breath sounds normal.  Abdominal: Soft. Bowel sounds are normal.  Musculoskeletal: Normal range of motion.  Neurological: She is alert and oriented to person, place, and time.  Skin: Skin is warm and dry.  Psychiatric: She has a normal mood and affect.  Nursing note and vitals reviewed.    ED Treatments / Results  Labs (all labs ordered are listed, but only abnormal results are displayed) Labs Reviewed - No data to display  EKG  EKG Interpretation None       Radiology No results found.  Procedures Procedures (including critical care time)  Medications Ordered in ED Medications - No data to display   Initial Impression / Assessment and Plan / ED Course  I have reviewed the triage vital signs and the nursing notes.  Pertinent labs & imaging results that were available during my care of the patient were reviewed by me and considered  in my medical decision making (see chart for details).  83 year old female here with bilateral eye redness and pain. Her history and physical exam findings are concerning for a bacterial conjunctivitis. She denies any visual disturbance. No signs or symptoms concerning for orbital or preseptal cellulitis. No pupil changes or hypopyon suggestive of uveitis. No pain with EOMs. Does not wear corrective lenses. Will start on Polytrim drops. Close follow-up with eye doctor and/or PCP.  Plan discussed with patient and daughter at bedside, they acknowledged understand and agreed with plan of care.  Return precautions discussed.  Final Clinical Impressions(s) / ED Diagnoses   Final diagnoses:  Eye redness    New Prescriptions New Prescriptions   TRIMETHOPRIM-POLYMYXIN B (POLYTRIM) OPHTHALMIC SOLUTION    Place 1 drop into both eyes every 4 (four) hours.     Larene Pickett, PA-C 12/09/16 [place], [place], DO 12/09/16 1256

## 2016-12-28 ENCOUNTER — Ambulatory Visit: Payer: Medicare HMO | Admitting: Internal Medicine

## 2016-12-31 ENCOUNTER — Encounter (HOSPITAL_COMMUNITY): Payer: Self-pay | Admitting: Emergency Medicine

## 2016-12-31 ENCOUNTER — Emergency Department (HOSPITAL_COMMUNITY): Payer: Medicare HMO

## 2016-12-31 ENCOUNTER — Emergency Department (HOSPITAL_COMMUNITY)
Admission: EM | Admit: 2016-12-31 | Discharge: 2016-12-31 | Disposition: A | Discharge status: Home / Self Care | Payer: Medicare HMO | Source: Home / Self Care | Attending: Physician Assistant | Admitting: Physician Assistant

## 2016-12-31 ENCOUNTER — Encounter (HOSPITAL_COMMUNITY): Payer: Self-pay

## 2016-12-31 ENCOUNTER — Inpatient Hospital Stay (HOSPITAL_COMMUNITY)
Admission: EM | Admit: 2016-12-31 | Discharge: 2017-01-04 | DRG: 312 | Disposition: A | Discharge status: Home / Self Care | Payer: Medicare HMO | Attending: Internal Medicine | Admitting: Internal Medicine

## 2016-12-31 DIAGNOSIS — Z7982 Long term (current) use of aspirin: Secondary | ICD-10-CM

## 2016-12-31 DIAGNOSIS — N183 Chronic kidney disease, stage 3 unspecified: Secondary | ICD-10-CM | POA: Diagnosis present

## 2016-12-31 DIAGNOSIS — S0010XA Contusion of unspecified eyelid and periocular area, initial encounter: Secondary | ICD-10-CM | POA: Diagnosis not present

## 2016-12-31 DIAGNOSIS — I700 Atherosclerosis of aorta: Secondary | ICD-10-CM | POA: Diagnosis not present

## 2016-12-31 DIAGNOSIS — Z950 Presence of cardiac pacemaker: Secondary | ICD-10-CM

## 2016-12-31 DIAGNOSIS — M810 Age-related osteoporosis without current pathological fracture: Secondary | ICD-10-CM | POA: Diagnosis present

## 2016-12-31 DIAGNOSIS — N390 Urinary tract infection, site not specified: Secondary | ICD-10-CM | POA: Diagnosis not present

## 2016-12-31 DIAGNOSIS — T502X5A Adverse effect of carbonic-anhydrase inhibitors, benzothiadiazides and other diuretics, initial encounter: Secondary | ICD-10-CM | POA: Diagnosis present

## 2016-12-31 DIAGNOSIS — S0181XA Laceration without foreign body of other part of head, initial encounter: Secondary | ICD-10-CM | POA: Diagnosis not present

## 2016-12-31 DIAGNOSIS — Z23 Encounter for immunization: Secondary | ICD-10-CM | POA: Insufficient documentation

## 2016-12-31 DIAGNOSIS — G9340 Encephalopathy, unspecified: Secondary | ICD-10-CM | POA: Diagnosis present

## 2016-12-31 DIAGNOSIS — W06XXXA Fall from bed, initial encounter: Secondary | ICD-10-CM

## 2016-12-31 DIAGNOSIS — F0390 Unspecified dementia without behavioral disturbance: Secondary | ICD-10-CM | POA: Insufficient documentation

## 2016-12-31 DIAGNOSIS — I5082 Biventricular heart failure: Secondary | ICD-10-CM | POA: Diagnosis not present

## 2016-12-31 DIAGNOSIS — E119 Type 2 diabetes mellitus without complications: Secondary | ICD-10-CM | POA: Insufficient documentation

## 2016-12-31 DIAGNOSIS — E1122 Type 2 diabetes mellitus with diabetic chronic kidney disease: Secondary | ICD-10-CM | POA: Diagnosis present

## 2016-12-31 DIAGNOSIS — I2699 Other pulmonary embolism without acute cor pulmonale: Secondary | ICD-10-CM | POA: Diagnosis not present

## 2016-12-31 DIAGNOSIS — R55 Syncope and collapse: Secondary | ICD-10-CM | POA: Diagnosis present

## 2016-12-31 DIAGNOSIS — E860 Dehydration: Secondary | ICD-10-CM | POA: Diagnosis present

## 2016-12-31 DIAGNOSIS — Z833 Family history of diabetes mellitus: Secondary | ICD-10-CM | POA: Diagnosis not present

## 2016-12-31 DIAGNOSIS — Z8673 Personal history of transient ischemic attack (TIA), and cerebral infarction without residual deficits: Secondary | ICD-10-CM

## 2016-12-31 DIAGNOSIS — Z9181 History of falling: Secondary | ICD-10-CM

## 2016-12-31 DIAGNOSIS — W1830XA Fall on same level, unspecified, initial encounter: Secondary | ICD-10-CM | POA: Diagnosis not present

## 2016-12-31 DIAGNOSIS — W19XXXA Unspecified fall, initial encounter: Secondary | ICD-10-CM

## 2016-12-31 DIAGNOSIS — Y998 Other external cause status: Secondary | ICD-10-CM | POA: Insufficient documentation

## 2016-12-31 DIAGNOSIS — R4189 Other symptoms and signs involving cognitive functions and awareness: Secondary | ICD-10-CM | POA: Diagnosis present

## 2016-12-31 DIAGNOSIS — R0602 Shortness of breath: Secondary | ICD-10-CM | POA: Diagnosis not present

## 2016-12-31 DIAGNOSIS — I130 Hypertensive heart and chronic kidney disease with heart failure and stage 1 through stage 4 chronic kidney disease, or unspecified chronic kidney disease: Secondary | ICD-10-CM | POA: Insufficient documentation

## 2016-12-31 DIAGNOSIS — I951 Orthostatic hypotension: Secondary | ICD-10-CM

## 2016-12-31 DIAGNOSIS — Y92003 Bedroom of unspecified non-institutional (private) residence as the place of occurrence of the external cause: Secondary | ICD-10-CM

## 2016-12-31 DIAGNOSIS — N179 Acute kidney failure, unspecified: Secondary | ICD-10-CM | POA: Diagnosis not present

## 2016-12-31 DIAGNOSIS — G4489 Other headache syndrome: Secondary | ICD-10-CM | POA: Diagnosis not present

## 2016-12-31 DIAGNOSIS — I499 Cardiac arrhythmia, unspecified: Secondary | ICD-10-CM | POA: Diagnosis not present

## 2016-12-31 DIAGNOSIS — I5032 Chronic diastolic (congestive) heart failure: Secondary | ICD-10-CM | POA: Insufficient documentation

## 2016-12-31 DIAGNOSIS — Z8249 Family history of ischemic heart disease and other diseases of the circulatory system: Secondary | ICD-10-CM | POA: Diagnosis not present

## 2016-12-31 DIAGNOSIS — I5042 Chronic combined systolic (congestive) and diastolic (congestive) heart failure: Secondary | ICD-10-CM | POA: Diagnosis not present

## 2016-12-31 DIAGNOSIS — N189 Chronic kidney disease, unspecified: Secondary | ICD-10-CM | POA: Diagnosis not present

## 2016-12-31 DIAGNOSIS — Z90710 Acquired absence of both cervix and uterus: Secondary | ICD-10-CM

## 2016-12-31 DIAGNOSIS — Z8679 Personal history of other diseases of the circulatory system: Secondary | ICD-10-CM

## 2016-12-31 DIAGNOSIS — I4891 Unspecified atrial fibrillation: Secondary | ICD-10-CM | POA: Insufficient documentation

## 2016-12-31 DIAGNOSIS — S199XXA Unspecified injury of neck, initial encounter: Secondary | ICD-10-CM | POA: Diagnosis not present

## 2016-12-31 DIAGNOSIS — S01111A Laceration without foreign body of right eyelid and periocular area, initial encounter: Secondary | ICD-10-CM | POA: Insufficient documentation

## 2016-12-31 DIAGNOSIS — I878 Other specified disorders of veins: Secondary | ICD-10-CM | POA: Diagnosis not present

## 2016-12-31 DIAGNOSIS — Z823 Family history of stroke: Secondary | ICD-10-CM

## 2016-12-31 DIAGNOSIS — Y939 Activity, unspecified: Secondary | ICD-10-CM

## 2016-12-31 DIAGNOSIS — R079 Chest pain, unspecified: Secondary | ICD-10-CM | POA: Diagnosis not present

## 2016-12-31 DIAGNOSIS — K219 Gastro-esophageal reflux disease without esophagitis: Secondary | ICD-10-CM | POA: Diagnosis present

## 2016-12-31 DIAGNOSIS — M47812 Spondylosis without myelopathy or radiculopathy, cervical region: Secondary | ICD-10-CM | POA: Diagnosis not present

## 2016-12-31 DIAGNOSIS — S098XXA Other specified injuries of head, initial encounter: Secondary | ICD-10-CM | POA: Diagnosis not present

## 2016-12-31 DIAGNOSIS — D649 Anemia, unspecified: Secondary | ICD-10-CM | POA: Diagnosis present

## 2016-12-31 DIAGNOSIS — Z79899 Other long term (current) drug therapy: Secondary | ICD-10-CM | POA: Insufficient documentation

## 2016-12-31 DIAGNOSIS — S0990XA Unspecified injury of head, initial encounter: Secondary | ICD-10-CM | POA: Diagnosis not present

## 2016-12-31 DIAGNOSIS — R000 Tachycardia, unspecified: Secondary | ICD-10-CM | POA: Diagnosis not present

## 2016-12-31 DIAGNOSIS — Z8719 Personal history of other diseases of the digestive system: Secondary | ICD-10-CM | POA: Diagnosis not present

## 2016-12-31 LAB — BASIC METABOLIC PANEL
ANION GAP: 9 (ref 5–15)
Anion gap: 8 (ref 5–15)
BUN: 15 mg/dL (ref 6–20)
BUN: 14 mg/dL (ref 6–20)
CALCIUM: 8.8 mg/dL — AB (ref 8.9–10.3)
CHLORIDE: 102 mmol/L (ref 101–111)
CO2: 22 mmol/L (ref 22–32)
CO2: 27 mmol/L (ref 22–32)
Calcium: 8.7 mg/dL — ABNORMAL LOW (ref 8.9–10.3)
Chloride: 106 mmol/L (ref 101–111)
Creatinine, Ser: 1.22 mg/dL — ABNORMAL HIGH (ref 0.44–1.00)
Creatinine, Ser: 1.51 mg/dL — ABNORMAL HIGH (ref 0.44–1.00)
GFR calc Af Amer: 34 mL/min — ABNORMAL LOW (ref >60)
GFR calc non Af Amer: 38 mL/min — ABNORMAL LOW (ref >60)
GFR calc non Af Amer: 29 mL/min — ABNORMAL LOW (ref >60)
GFR, EST AFRICAN AMERICAN: 44 mL/min — AB (ref >60)
Glucose, Bld: 91 mg/dL (ref 65–99)
Glucose, Bld: 139 mg/dL — ABNORMAL HIGH (ref 65–99)
POTASSIUM: 4.5 mmol/L (ref 3.5–5.1)
Potassium: 4.3 mmol/L (ref 3.5–5.1)
SODIUM: 137 mmol/L (ref 135–145)
SODIUM: 137 mmol/L (ref 135–145)

## 2016-12-31 LAB — I-STAT TROPONIN, ED: TROPONIN I, POC: 0.01 ng/mL (ref 0.00–0.08)

## 2016-12-31 LAB — CBC
HCT: 25.5 % — ABNORMAL LOW (ref 36.0–46.0)
HEMATOCRIT: 27.3 % — AB (ref 36.0–46.0)
HEMOGLOBIN: 8.5 g/dL — AB (ref 12.0–15.0)
Hemoglobin: 8.8 g/dL — ABNORMAL LOW (ref 12.0–15.0)
MCH: 28.9 pg (ref 26.0–34.0)
MCH: 28.6 pg (ref 26.0–34.0)
MCHC: 33.3 g/dL (ref 30.0–36.0)
MCHC: 32.2 g/dL (ref 30.0–36.0)
MCV: 86.7 fL (ref 78.0–100.0)
MCV: 88.6 fL (ref 78.0–100.0)
PLATELETS: 143 10*3/uL — AB (ref 150–400)
Platelets: 163 10*3/uL (ref 150–400)
RBC: 2.94 MIL/uL — AB (ref 3.87–5.11)
RBC: 3.08 MIL/uL — ABNORMAL LOW (ref 3.87–5.11)
RDW: 17.5 % — ABNORMAL HIGH (ref 11.5–15.5)
RDW: 17.6 % — AB (ref 11.5–15.5)
WBC: 3.6 10*3/uL — AB (ref 4.0–10.5)
WBC: 4.6 10*3/uL (ref 4.0–10.5)

## 2016-12-31 LAB — CBG MONITORING, ED: GLUCOSE-CAPILLARY: 133 mg/dL — AB (ref 65–99)

## 2016-12-31 LAB — I-STAT CG4 LACTIC ACID, ED: Lactic Acid, Venous: 2.35 mmol/L (ref 0.5–1.9)

## 2016-12-31 MED ORDER — TETANUS-DIPHTH-ACELL PERTUSSIS 5-2.5-18.5 LF-MCG/0.5 IM SUSP
0.5000 mL | Freq: Once | INTRAMUSCULAR | Status: AC
  Administered 2016-12-31: 0.5 mL via INTRAMUSCULAR
  Filled 2016-12-31: qty 0.5

## 2016-12-31 MED ORDER — LIDOCAINE HCL (PF) 1 % IJ SOLN
5.0000 mL | Freq: Once | INTRAMUSCULAR | Status: AC
  Administered 2016-12-31: 5 mL via INTRADERMAL
  Filled 2016-12-31: qty 5

## 2016-12-31 MED ORDER — SODIUM CHLORIDE 0.9 % IV BOLUS (SEPSIS)
500.0000 mL | Freq: Once | INTRAVENOUS | Status: AC
  Administered 2016-12-31: 500 mL via INTRAVENOUS

## 2016-12-31 NOTE — ED Triage Notes (Signed)
Per EMS was in bed sleep and fell out of bed; EMS states no LOC; pt has hx of dementia;Fall was unwitnessed; pt has lac to right forehead; Pt has a facial droop that is baseline on arrival. Pt c/o of pain at 10/10 on bilateral arms and forehead; Pt a&o x 3 on arrival. Pt states she believes she is on blood thinners;-Monique,RN

## 2016-12-31 NOTE — ED Notes (Signed)
medtronic ICD successfully interrogated

## 2016-12-31 NOTE — ED Notes (Signed)
Patient ambulated independently using cane, per patient and family patient walking at baseline.

## 2016-12-31 NOTE — Discharge Instructions (Signed)
You have absorbable sutures in. If they do not absorb in 5 days. Please use peroxide help dissolve them.

## 2016-12-31 NOTE — ED Provider Notes (Signed)
[place] DEPT Provider Note   CSN: [id number] Arrival date & time: 12/31/16  2051     History   Chief Complaint Chief Complaint  Patient presents with  . Loss of Consciousness    HPI [name] is a 83 y.o. female.  HPI Pt comes in with cc of unresponsive spell. Pt has hx of dementia, AF not on coumadin and CHF. Per daughter, who we spoke with over the phone, she and the patient were talking when pt stopped speaking. She noted that her mother was not breathing and called 911, and initiated CPR. Paramedics reported that when they arrived pt needed respiratory support, but she had a pulse. Pt was responsive, but sluggish. Pt currently complains of R sided chest pain. She is oriented to self and location and thinks she is in the ER as she had a fall. There was no seizure like activity.  Past Medical History:  Diagnosis Date  . Adenomatous colon polyp    tubular  . Allergic rhinitis   . Anemia    Iron deficiency, does not tolerate po  . Arthritis    "all over my body"  . Atrial fibrillation (HCC)    on coumadin followed by Dr. Elie Confer  . CHF (congestive heart failure) (HCC)    diastolic dysfunction  . Constipation   . Dementia   . Diverticulosis of colon (without mention of hemorrhage)   . DJD (degenerative joint disease)   . GERD (gastroesophageal reflux disease)   . History of sick sinus syndrome s/p pacemaker Dr. Lovena Le 2000  . History of trichomonal urethritis   . History of vaginal bleeding 05/2005   nagative endometrial biopsy  . Hypertension    well controlled on 3 agents  . Incontinence overflow, stress female    on vesicare  . Onychomycosis   . Osteoporosis   . Pacemaker   . Restrictive lung disease 1996   PFT's showed mild disease  . Stroke [facility]) 2002   R MCA, cardioembolic  . Type II diabetes mellitus (HCC)    diet controlled  . Weight loss, unintentional 2010-2011   8/08-8/10 138-146 lbs, between 8/10-10/11 lost 18 lbs., work-up negative     Patient Active Problem List   Diagnosis Date Noted  . Hypertensive heart and kidney disease with biventricular heart failure and stage 3 chronic kidney disease ([place])   . Occult blood in stools   . Other pancytopenia ([place])   . Acute on chronic systolic heart failure ([place]) 11/03/2016  . Knee osteoarthritis 10/12/2016  . Primary pulmonary HTN ([place])   . Venous (peripheral) insufficiency   . Acute kidney injury ([place]) 03/16/2016  . Malaise and fatigue 03/16/2016  . Hyperglycemia 10/07/2015  . Fecal incontinence due to anorectal disorder 11/06/2014  . Health care maintenance 02/27/2014  . Malnutrition of moderate degree ([place]) 01/22/2014  . Anemia 01/21/2014  . CKD (chronic kidney disease) stage 3, GFR 30-59 ml/min 01/21/2014  . Allergic rhinitis 10/16/2013  . Thrombocytopenia ([place]) 09/08/2013  . Decreased hearing 08/23/2012  . At high risk for falls 08/10/2012  . Age-related cognitive decline 08/10/2012  . Normochromic anemia 05/15/2012  . GERD (gastroesophageal reflux disease) 06/15/2011  . Pacemaker-[employer]tronic 05/23/2011  . Chronic combined systolic and diastolic CHF, NYHA class 2 ([place])   . Hypertension   . Osteoporosis   . History of stroke   . Atrial fibrillation, controlled ([place])   . Incontinence overflow, stress female   . History of sick sinus syndrome   . B12 deficiency 11/05/2009  .  Constipation 10/30/2009    Past Surgical History:  Procedure Laterality Date  . ABDOMINAL HYSTERECTOMY    . CATARACT EXTRACTION    . CHOLECYSTECTOMY    . COLONOSCOPY    . ESOPHAGOGASTRODUODENOSCOPY    . INSERT / REPLACE / REMOVE PACEMAKER  2000   MDT Sigma pacemaker implanted by Dr Lovena Le;  Marland Kitchen PACEMAKER GENERATOR CHANGE N/A 11/02/2012   Procedure: PACEMAKER GENERATOR CHANGE;  Surgeon: Evans Lance, MD;  Location: [facility] CATH LAB;  Service: Cardiovascular;  Laterality: N/A;    OB History    No data available       Home Medications    Prior to Admission medications   Medication  Sig Start Date End Date Taking? Authorizing Provider  aspirin EC 81 MG tablet Take 81 mg by mouth daily.    [provider]  diclofenac sodium (VOLTAREN) 1 % GEL Apply 2 g topically 4 (four) times daily.    [provider]  docusate sodium (COLACE) 100 MG capsule Take 1 capsule (100 mg total) by mouth daily as needed. Patient taking differently: Take 100 mg by mouth daily as needed for mild constipation.  06/20/16 06/20/17  Norman Herrlich, MD  furosemide (LASIX) 20 MG tablet Take 2 tablets (40 mg total) by mouth daily. 11/25/16   Sid Falcon, MD  loratadine (CLARITIN) 5 MG chewable tablet Chew 1 tablet (5 mg total) by mouth daily. Patient not taking: Reported on 12/31/2016 08/10/16   Rivet, Sindy Guadeloupe, MD  metoprolol tartrate (LOPRESSOR) 100 MG tablet TAKE 1 TABLET(100 MG) BY MOUTH TWICE DAILY 10/24/16   Sid Falcon, MD  Nutritional Supplements ([place]) LIQD Take 237 mLs by mouth 3 (three) times daily. 12/19/14   Zada Finders, MD  pantoprazole (PROTONIX) 40 MG tablet Take 1 tablet (40 mg total) by mouth daily. 08/10/16   Rivet, Sindy Guadeloupe, MD  polyethylene glycol (MIRALAX / GLYCOLAX) packet Take 17 g by mouth daily as needed for mild constipation. 03/20/16   Maryellen Pile, MD  potassium chloride 20 MEQ/15ML (10%) SOLN TAKE 15 MLS BY MOUTH DAILY Patient taking differently: Take 20 mEq by mouth daily.  09/02/16   Sid Falcon, MD  trimethoprim-polymyxin b (POLYTRIM) ophthalmic solution Place 1 drop into both eyes every 4 (four) hours. 12/09/16   Larene Pickett, PA-C    Family History Family History  Problem Relation Age of Onset  . Diabetes Sister   . Stroke Sister   . Heart disease Unknown   . Heart disease Father     Social History Social History  Substance Use Topics  . Smoking status: Never Smoker  . Smokeless tobacco: Never Used  . Alcohol use 1.2 oz/week    2 Cans of beer per week     Allergies   Patient has no known allergies.   Review of  Systems Review of Systems  Unable to perform ROS: Dementia  Constitutional: Negative for activity change.  Respiratory: Positive for chest tightness. Negative for shortness of breath.   Cardiovascular: Positive for chest pain.     Physical Exam Updated Vital Signs BP 102/64   Pulse 67   Temp (!) 97.5 F (36.4 C) (Oral)   Resp (!) 21   SpO2 95%   Physical Exam  Constitutional: She appears well-developed.  HENT:  Head: Normocephalic and atraumatic.  Eyes: EOM are normal.  Neck: Normal range of motion. Neck supple.  Cardiovascular:  Irregular rhythm  Pulmonary/Chest: Effort normal. She exhibits tenderness.  Abdominal:  Soft. Bowel sounds are normal.  Skin: Skin is warm and dry.  R forehead wound  Nursing note and vitals reviewed.    ED Treatments / Results  Labs (all labs ordered are listed, but only abnormal results are displayed) Labs Reviewed  BASIC METABOLIC PANEL - Abnormal; Notable for the following:       Result Value   Glucose, Bld 139 (*)    Creatinine, Ser 1.51 (*)    Calcium 8.7 (*)    GFR calc non Af Amer 29 (*)    GFR calc Af Amer 34 (*)    All other components within normal limits  CBC - Abnormal; Notable for the following:    RBC 3.08 (*)    Hemoglobin 8.8 (*)    HCT 27.3 (*)    RDW 17.6 (*)    All other components within normal limits  CBG MONITORING, ED - Abnormal; Notable for the following:    Glucose-Capillary 133 (*)    All other components within normal limits  I-STAT CG4 LACTIC ACID, ED - Abnormal; Notable for the following:    Lactic Acid, Venous 2.35 (*)    All other components within normal limits  URINALYSIS, ROUTINE W REFLEX MICROSCOPIC  I-STAT TROPONIN, ED    EKG  EKG Interpretation  Date/Time:  Saturday December 31 2016 21:14:52 EDT Ventricular Rate:  75 PR Interval:    QRS Duration: 102 QT Interval:  403 QTC Calculation: 459 R Axis:   -123 Text Interpretation:  Atrial fibrillation Right axis deviation Low voltage,  extremity and precordial leads Nonspecific repol abnormality, lateral leads No acute changes No significant change since last tracing Confirmed by Varney Biles ([id number]) on 12/31/2016 11:30:28 PM       Radiology Ct Head Wo Contrast  Result Date: 12/31/2016 CLINICAL DATA:  [place], striking the right side of the forehead. Laceration with bleeding. EXAM: CT HEAD WITHOUT CONTRAST CT CERVICAL SPINE WITHOUT CONTRAST TECHNIQUE: Multidetector CT imaging of the head and cervical spine was performed following the standard protocol without intravenous contrast. Multiplanar CT image reconstructions of the cervical spine were also generated. COMPARISON:  12/27/2015 FINDINGS: CT HEAD FINDINGS Brain: No acute or traumatic finding. Old right frontoparietal cortical and subcortical infarction. Chronic small-vessel ischemic changes throughout the hemispheric white matter. No hemorrhage, hydrocephalus or extra-axial collection. Vascular: There is atherosclerotic calcification of the major vessels at the base of the brain. Skull: No skull fracture.  Right anterior soft tissue injury. Sinuses/Orbits: No layering fluid. Mild chronic mucosal thickening of the sphenoid sinus. Other: None CT CERVICAL SPINE FINDINGS Alignment: Minimal curvature convex to the right. Skull base and vertebrae: No fracture Soft tissues and spinal canal: No soft tissue swelling. Bilateral carotid calcification. Disc levels: Degenerative spondylosis throughout the cervical spine with disc space narrowing. No significant facet arthropathy. No significant bony canal or foraminal stenosis. Upper chest: Chronic scarring. Other: None IMPRESSION: Head CT: No intracranial acute or traumatic finding. Old right MCA territory infarction. Right frontal scalp injury. Cervical spine CT: No acute or traumatic finding. Chronic degenerative spondylosis. Electronically Signed   By: Nelson Chimes M.D.   On: 12/31/2016 07:17   Ct Cervical Spine Wo Contrast  Result Date:  12/31/2016 CLINICAL DATA:  [place], striking the right side of the forehead. Laceration with bleeding. EXAM: CT HEAD WITHOUT CONTRAST CT CERVICAL SPINE WITHOUT CONTRAST TECHNIQUE: Multidetector CT imaging of the head and cervical spine was performed following the standard protocol without intravenous contrast. Multiplanar CT image reconstructions of the cervical spine were  also generated. COMPARISON:  12/27/2015 FINDINGS: CT HEAD FINDINGS Brain: No acute or traumatic finding. Old right frontoparietal cortical and subcortical infarction. Chronic small-vessel ischemic changes throughout the hemispheric white matter. No hemorrhage, hydrocephalus or extra-axial collection. Vascular: There is atherosclerotic calcification of the major vessels at the base of the brain. Skull: No skull fracture.  Right anterior soft tissue injury. Sinuses/Orbits: No layering fluid. Mild chronic mucosal thickening of the sphenoid sinus. Other: None CT CERVICAL SPINE FINDINGS Alignment: Minimal curvature convex to the right. Skull base and vertebrae: No fracture Soft tissues and spinal canal: No soft tissue swelling. Bilateral carotid calcification. Disc levels: Degenerative spondylosis throughout the cervical spine with disc space narrowing. No significant facet arthropathy. No significant bony canal or foraminal stenosis. Upper chest: Chronic scarring. Other: None IMPRESSION: Head CT: No intracranial acute or traumatic finding. Old right MCA territory infarction. Right frontal scalp injury. Cervical spine CT: No acute or traumatic finding. Chronic degenerative spondylosis. Electronically Signed   By: Nelson Chimes M.D.   On: 12/31/2016 07:17   Dg Chest Portable 1 View  Result Date: 12/31/2016 CLINICAL DATA:  Right-sided chest pain after CPR was performed on patient. Episode of unresponsiveness. EXAM: PORTABLE CHEST 1 VIEW COMPARISON:  11/16/2016 FINDINGS: Stable cardiomegaly with aortic atherosclerosis. Mild central vascular congestion  with layering small to moderate right pleural effusion. No acute displaced rib fracture. No acute osseous appearing. IMPRESSION: Stable cardiomegaly with aortic atherosclerosis. Mild central vascular congestion new small to moderate right pleural effusion. Electronically Signed   By: Ashley Royalty M.D.   On: 12/31/2016 21:59    Procedures Procedures (including critical care time)  Medications Ordered in ED Medications  sodium chloride 0.9 % bolus 500 mL (500 mLs Intravenous New Bag/Given 12/31/16 2317)     Initial Impression / Assessment and Plan / ED Course  I have reviewed the triage vital signs and the nursing notes.  Pertinent labs & imaging results that were available during my care of the patient were reviewed by me and considered in my medical decision making (see chart for details).     Pt comes in after an unresponsive episode. No pulse was checked prior to checking CPR, pt has a pacemaker, and her interrogation doesn't show any abnormality.  DDx includes: Orthostatic hypotension Stroke Vertebral artery dissection/stenosis Dysrhythmia PE Vasovagal/neurocardiogenic syncope Aortic stenosis Valvular disorder/Cardiomyopathy Anemia  Based on the hx, results - I suspect syncope from nan cardiac etiology or possible absent seizure. Transient global amnesia is also possible.  Medicine will admit.  Final Clinical Impressions(s) / ED Diagnoses   Final diagnoses:  Syncope and collapse  AKI (acute kidney injury) ([place])  Orthostatic hypotension    New Prescriptions New Prescriptions   No medications on file     Varney Biles, MD 12/31/16 2352

## 2016-12-31 NOTE — ED Notes (Signed)
Initial ICD interrogation given to Dr. Kathrynn Humble

## 2016-12-31 NOTE — ED Triage Notes (Signed)
Patient from home speaking with family and suddenly had her eyes roll back in her head, went unresponsive, family started CPR.  Upon FD arrival, patient had a pulse and they started rescue breathing.  Patient was fully awake and alert upon EMS arrival.  Patient is complaining of right sided chest pain with EMS.

## 2016-12-31 NOTE — ED Notes (Signed)
Patient transported to CT 

## 2016-12-31 NOTE — ED Provider Notes (Signed)
[facility] DEPT Provider Note   CSN: [id number] Arrival date & time: 12/31/16  0603     History   Chief Complaint Chief Complaint  Patient presents with  . Fall    HPI [name] is a 83 y.o. female.  HPI   Patient is a 83 year old female who lives alone presenting after fall. Patient's a few is staying with her and she got out of bed and fell. Patient has advanced dementia, hypertension, A. fib on Coumadin, history of sick sinus. Patient had no recent symptoms. No fevers no nausea no vomiting. Patient reports it was a mechanical fall. She is accompanied by family today.  Past Medical History:  Diagnosis Date  . Adenomatous colon polyp    tubular  . Allergic rhinitis   . Anemia    Iron deficiency, does not tolerate po  . Arthritis    "all over my body"  . Atrial fibrillation (HCC)    on coumadin followed by Dr. Elie Confer  . CHF (congestive heart failure) (HCC)    diastolic dysfunction  . Constipation   . Dementia   . Diverticulosis of colon (without mention of hemorrhage)   . DJD (degenerative joint disease)   . GERD (gastroesophageal reflux disease)   . History of sick sinus syndrome s/p pacemaker Dr. Lovena Le 2000  . History of trichomonal urethritis   . History of vaginal bleeding 05/2005   nagative endometrial biopsy  . Hypertension    well controlled on 3 agents  . Incontinence overflow, stress female    on vesicare  . Onychomycosis   . Osteoporosis   . Pacemaker   . Restrictive lung disease 1996   PFT's showed mild disease  . Stroke [facility]) 2002   R MCA, cardioembolic  . Type II diabetes mellitus (HCC)    diet controlled  . Weight loss, unintentional 2010-2011   8/08-8/10 138-146 lbs, between 8/10-10/11 lost 18 lbs., work-up negative    Patient Active Problem List   Diagnosis Date Noted  . Hypertensive heart and kidney disease with biventricular heart failure and stage 3 chronic kidney disease ([place])   . Occult blood in stools   . Other  pancytopenia ([place])   . Acute on chronic systolic heart failure ([place]) 11/03/2016  . Knee osteoarthritis 10/12/2016  . Primary pulmonary HTN ([place])   . Venous (peripheral) insufficiency   . Acute kidney injury ([place]) 03/16/2016  . Malaise and fatigue 03/16/2016  . Hyperglycemia 10/07/2015  . Fecal incontinence due to anorectal disorder 11/06/2014  . Health care maintenance 02/27/2014  . Malnutrition of moderate degree ([place]) 01/22/2014  . Anemia 01/21/2014  . CKD (chronic kidney disease) stage 3, GFR 30-59 ml/min 01/21/2014  . Allergic rhinitis 10/16/2013  . Thrombocytopenia ([place]) 09/08/2013  . Decreased hearing 08/23/2012  . At high risk for falls 08/10/2012  . Age-related cognitive decline 08/10/2012  . Normochromic anemia 05/15/2012  . GERD (gastroesophageal reflux disease) 06/15/2011  . Pacemaker-[employer]tr[employer] 05/23/2011  . Chronic combined systolic and diastolic CHF, NYHA class 2 ([place])   . Hypertension   . Osteoporosis   . History of stroke   . Atrial fibrillation, controlled ([place])   . Incontinence overflow, stress female   . History of sick sinus syndrome   . B12 deficiency 11/05/2009  . Constipation 10/30/2009    Past Surgical History:  Procedure Laterality Date  . ABDOMINAL HYSTERECTOMY    . CATARACT EXTRACTION    . CHOLECYSTECTOMY    . COLONOSCOPY    . ESOPHAGOGASTRODUODENOSCOPY    .  INSERT / REPLACE / REMOVE PACEMAKER  2000   MDT Sigma pacemaker implanted by Dr Lovena Le;  Marland Kitchen PACEMAKER GENERATOR CHANGE N/A 11/02/2012   Procedure: PACEMAKER GENERATOR CHANGE;  Surgeon: Evans Lance, MD;  Location: [facility] CATH LAB;  Service: Cardiovascular;  Laterality: N/A;    OB History    No data available       Home Medications    Prior to Admission medications   Medication Sig Start Date End Date Taking? Authorizing Provider  aspirin EC 81 MG tablet Take 81 mg by mouth daily.   Yes [provider]  diclofenac sodium (VOLTAREN) 1 % GEL Apply 2 g topically 4 (four) times  daily.   Yes [provider]  furosemide (LASIX) 20 MG tablet Take 2 tablets (40 mg total) by mouth daily. 11/25/16  Yes Sid Falcon, MD  metoprolol tartrate (LOPRESSOR) 100 MG tablet TAKE 1 TABLET(100 MG) BY MOUTH TWICE DAILY 10/24/16  Yes Sid Falcon, MD  Nutritional Supplements ([place]) LIQD Take 237 mLs by mouth 3 (three) times daily. 12/19/14  Yes Zada Finders, MD  pantoprazole (PROTONIX) 40 MG tablet Take 1 tablet (40 mg total) by mouth daily. 08/10/16  Yes Rivet, Carly J, MD  potassium chloride 20 MEQ/15ML (10%) SOLN TAKE 15 MLS BY MOUTH DAILY Patient taking differently: Take 20 mEq by mouth daily.  09/02/16  Yes Sid Falcon, MD  trimethoprim-polymyxin b (POLYTRIM) ophthalmic solution Place 1 drop into both eyes every 4 (four) hours. 12/09/16  Yes Larene Pickett, PA-C  docusate sodium (COLACE) 100 MG capsule Take 1 capsule (100 mg total) by mouth daily as needed. Patient taking differently: Take 100 mg by mouth daily as needed for mild constipation.  06/20/16 06/20/17  Norman Herrlich, MD  loratadine (CLARITIN) 5 MG chewable tablet Chew 1 tablet (5 mg total) by mouth daily. Patient not taking: Reported on 12/31/2016 08/10/16   Rivet, Sindy Guadel[place], MD  polyethylene glycol (MIRALAX / GLYCOLAX) packet Take 17 g by mouth daily as needed for mild constipation. 03/20/16   Maryellen Pile, MD    Family History Family History  Problem Relation Age of Onset  . Diabetes Sister   . Stroke Sister   . Heart disease Unknown   . Heart disease Father     Social History Social History  Substance Use Topics  . Smoking status: Never Smoker  . Smokeless tobacco: Never Used  . Alcohol use 1.2 oz/week    2 Cans of beer per week     Allergies   Patient has no known allergies.   Review of Systems Review of Systems  Constitutional: Negative for activity change.  Respiratory: Negative for shortness of breath.   Cardiovascular: Negative for chest pain.  Gastrointestinal:  Negative for abdominal pain.  Psychiatric/Behavioral: Negative for agitation and behavioral problems.  All other systems reviewed and are negative.    Physical Exam Updated Vital Signs BP 139/79   Pulse (!) 110   Temp 98.2 F (36.8 C) (Oral)   Resp (!) 21   SpO2 100%   Physical Exam  Constitutional: She is oriented to person, place, and time. She appears well-developed and well-nourished.  HENT:  Head: Normocephalic and atraumatic.  Eyes: Right eye exhibits no discharge.  Laceration over R eye brow  Cardiovascular: Normal rate, regular rhythm and normal heart sounds.   No murmur heard. Pulmonary/Chest: Effort normal and breath sounds normal. She has no wheezes. She has no rales.  Abdominal: Soft. She  exhibits no distension. There is no tenderness.  Neurological: She is oriented to person, place, and time.  Skin: Skin is warm and dry. She is not diaphoretic.  Psychiatric: She has a normal mood and affect.  Nursing note and vitals reviewed.    ED Treatments / Results  Labs (all labs ordered are listed, but only abnormal results are displayed) Labs Reviewed  BASIC METABOLIC PANEL - Abnormal; Notable for the following:       Result Value   Creatinine, Ser 1.22 (*)    Calcium 8.8 (*)    GFR calc non Af Amer 38 (*)    GFR calc Af Amer 44 (*)    All other components within normal limits  CBC - Abnormal; Notable for the following:    WBC 3.6 (*)    RBC 2.94 (*)    Hemoglobin 8.5 (*)    HCT 25.5 (*)    RDW 17.5 (*)    Platelets 143 (*)    All other components within normal limits  URINALYSIS, ROUTINE W REFLEX MICROSCOPIC    EKG  EKG Interpretation  Date/Time:  Saturday December 31 2016 06:19:11 EDT Ventricular Rate:  112 PR Interval:    QRS Duration: 98 QT Interval:  311 QTC Calculation: 425 R Axis:   -28 Text Interpretation:  Atrial fibrillation Low voltage, extremity and precordial leads Borderline repolarization abnormality No significant change since last  tracing Confirmed by Pryor Curia [id number]) on 12/31/2016 6:22:58 AM Also confirmed by Ward, Cyril Mourning [phone]), editor Philomena Doheny [phone])  on 12/31/2016 9:01:06 AM       Radiology Ct Head Wo Contrast  Result Date: 12/31/2016 CLINICAL DATA:  [place], striking the right side of the forehead. Laceration with bleeding. EXAM: CT HEAD WITHOUT CONTRAST CT CERVICAL SPINE WITHOUT CONTRAST TECHNIQUE: Multidetector CT imaging of the head and cervical spine was performed following the standard protocol without intravenous contrast. Multiplanar CT image reconstructions of the cervical spine were also generated. COMPARISON:  12/27/2015 FINDINGS: CT HEAD FINDINGS Brain: No acute or traumatic finding. Old right frontoparietal cortical and subcortical infarction. Chronic small-vessel ischemic changes throughout the hemispheric white matter. No hemorrhage, hydrocephalus or extra-axial collection. Vascular: There is atherosclerotic calcification of the major vessels at the base of the brain. Skull: No skull fracture.  Right anterior soft tissue injury. Sinuses/Orbits: No layering fluid. Mild chronic mucosal thickening of the sphenoid sinus. Other: None CT CERVICAL SPINE FINDINGS Alignment: Minimal curvature convex to the right. Skull base and vertebrae: No fracture Soft tissues and spinal canal: No soft tissue swelling. Bilateral carotid calcification. Disc levels: Degenerative spondylosis throughout the cervical spine with disc space narrowing. No significant facet arthropathy. No significant bony canal or foraminal stenosis. Upper chest: Chronic scarring. Other: None IMPRESSION: Head CT: No intracranial acute or traumatic finding. Old right MCA territory infarction. Right frontal scalp injury. Cervical spine CT: No acute or traumatic finding. Chronic degenerative spondylosis. Electronically Signed   By: Nelson Chimes M.D.   On: 12/31/2016 07:17   Ct Cervical Spine Wo Contrast  Result Date: 12/31/2016 CLINICAL DATA:  [place],  striking the right side of the forehead. Laceration with bleeding. EXAM: CT HEAD WITHOUT CONTRAST CT CERVICAL SPINE WITHOUT CONTRAST TECHNIQUE: Multidetector CT imaging of the head and cervical spine was performed following the standard protocol without intravenous contrast. Multiplanar CT image reconstructions of the cervical spine were also generated. COMPARISON:  12/27/2015 FINDINGS: CT HEAD FINDINGS Brain: No acute or traumatic finding. Old right frontoparietal cortical and subcortical infarction. Chronic small-vessel ischemic  changes throughout the hemispheric white matter. No hemorrhage, hydrocephalus or extra-axial collection. Vascular: There is atherosclerotic calcification of the major vessels at the base of the brain. Skull: No skull fracture.  Right anterior soft tissue injury. Sinuses/Orbits: No layering fluid. Mild chronic mucosal thickening of the sphenoid sinus. Other: None CT CERVICAL SPINE FINDINGS Alignment: Minimal curvature convex to the right. Skull base and vertebrae: No fracture Soft tissues and spinal canal: No soft tissue swelling. Bilateral carotid calcification. Disc levels: Degenerative spondylosis throughout the cervical spine with disc space narrowing. No significant facet arthropathy. No significant bony canal or foraminal stenosis. Upper chest: Chronic scarring. Other: None IMPRESSION: Head CT: No intracranial acute or traumatic finding. Old right MCA territory infarction. Right frontal scalp injury. Cervical spine CT: No acute or traumatic finding. Chronic degenerative spondylosis. Electronically Signed   By: Nelson Chimes M.D.   On: 12/31/2016 07:17    Procedures .Marland KitchenLaceration Repair Date/Time: 12/31/2016 9:56 AM Performed by: Zenovia Jarred LYN Authorized by: Zenovia Jarred LYN   Consent:    Consent obtained:  Verbal   Consent given by:  Patient   Risks discussed:  Pain, infection, poor cosmetic result and poor wound healing   Alternatives discussed:  No  treatment Anesthesia (see MAR for exact dosages):    Anesthesia method:  Local infiltration   Local anesthetic:  Lidocaine 1% WITH epi Laceration details:    Location:  Face   Face location:  R eyebrow   Length (cm):  7   Depth (mm):  4 Repair type:    Repair type:  Complex Pre-procedure details:    Preparation:  Patient was prepped and draped in usual sterile fashion Exploration:    Hemostasis achieved with:  Direct pressure   Wound exploration: entire depth of wound probed and visualized     Wound extent: no nerve damage noted and no tendon damage noted     Contaminated: no   Treatment:    Area cleansed with:  Betadine   Amount of cleaning:  Standard   Irrigation solution:  Sterile saline   Visualized foreign bodies/material removed: no     Debridement:  None   Undermining:  None Subcutaneous repair:    Suture size:  4-0   Suture material:  Vicryl   Suture technique:  Simple interrupted Skin repair:    Repair method:  Sutures   Suture size:  4-0   Suture material:  Chromic gut   Suture technique:  Simple interrupted Approximation:    Approximation:  Close   V[place] border: well-aligned   Post-procedure details:    Dressing:  Antibiotic ointment and bulky dressing   Patient tolerance of procedure:  Tolerated well, no immediate complications   (including critical care time)  Medications Ordered in ED Medications  lidocaine (PF) (XYLOCAINE) 1 % injection 5 mL (5 mLs Intradermal Given by Other 12/31/16 0829)  Tdap (BOOSTRIX) injection 0.5 mL (0.5 mLs Intramuscular Given 12/31/16 7253)     Initial Impression / Assessment and Plan / ED Course  I have reviewed the triage vital signs and the nursing notes.  Pertinent labs & imaging results that were available during my care of the patient were reviewed by me and considered in my medical decision making (see chart for details).     Patient is a 83 year old female who lives alone presenting after fall. Patient's a few  is staying with her and she got out of bed and fell. Patient has advanced dementia, hypertension, A. fib on Coumadin, history of sick  sinus. Patient had no recent symptoms. No fevers no nausea no vomiting. Patient reports it was a mechanical fall. She is accompanied by family today.  9:59 AM Will get CTs, labs.  Will repair laceatoin.    9:59 AM CTs normal labs reassuring. Patient requesting chicken biscuit and ready to leave. Laceration repaired.  Final Clinical Impressions(s) / ED Diagnoses   Final diagnoses:  Fall, initial encounter    New Prescriptions New Prescriptions   No medications on file     Macarthur Critchley, MD 12/31/16 [phone]

## 2017-01-01 ENCOUNTER — Encounter (HOSPITAL_COMMUNITY): Payer: Self-pay | Admitting: *Deleted

## 2017-01-01 DIAGNOSIS — Z8249 Family history of ischemic heart disease and other diseases of the circulatory system: Secondary | ICD-10-CM

## 2017-01-01 DIAGNOSIS — R4189 Other symptoms and signs involving cognitive functions and awareness: Secondary | ICD-10-CM | POA: Diagnosis present

## 2017-01-01 DIAGNOSIS — N189 Chronic kidney disease, unspecified: Secondary | ICD-10-CM | POA: Diagnosis not present

## 2017-01-01 DIAGNOSIS — F0390 Unspecified dementia without behavioral disturbance: Secondary | ICD-10-CM

## 2017-01-01 DIAGNOSIS — I499 Cardiac arrhythmia, unspecified: Secondary | ICD-10-CM

## 2017-01-01 DIAGNOSIS — Z8679 Personal history of other diseases of the circulatory system: Secondary | ICD-10-CM

## 2017-01-01 DIAGNOSIS — R55 Syncope and collapse: Secondary | ICD-10-CM | POA: Diagnosis not present

## 2017-01-01 DIAGNOSIS — M47812 Spondylosis without myelopathy or radiculopathy, cervical region: Secondary | ICD-10-CM | POA: Diagnosis not present

## 2017-01-01 DIAGNOSIS — Z823 Family history of stroke: Secondary | ICD-10-CM

## 2017-01-01 DIAGNOSIS — Z8673 Personal history of transient ischemic attack (TIA), and cerebral infarction without residual deficits: Secondary | ICD-10-CM

## 2017-01-01 DIAGNOSIS — Z950 Presence of cardiac pacemaker: Secondary | ICD-10-CM

## 2017-01-01 DIAGNOSIS — Z7982 Long term (current) use of aspirin: Secondary | ICD-10-CM

## 2017-01-01 DIAGNOSIS — I4891 Unspecified atrial fibrillation: Secondary | ICD-10-CM | POA: Diagnosis not present

## 2017-01-01 DIAGNOSIS — Z79899 Other long term (current) drug therapy: Secondary | ICD-10-CM

## 2017-01-01 DIAGNOSIS — Z9181 History of falling: Secondary | ICD-10-CM

## 2017-01-01 DIAGNOSIS — Z8719 Personal history of other diseases of the digestive system: Secondary | ICD-10-CM | POA: Diagnosis not present

## 2017-01-01 DIAGNOSIS — Z833 Family history of diabetes mellitus: Secondary | ICD-10-CM

## 2017-01-01 DIAGNOSIS — I5042 Chronic combined systolic (congestive) and diastolic (congestive) heart failure: Secondary | ICD-10-CM | POA: Diagnosis not present

## 2017-01-01 DIAGNOSIS — I878 Other specified disorders of veins: Secondary | ICD-10-CM | POA: Diagnosis not present

## 2017-01-01 DIAGNOSIS — I700 Atherosclerosis of aorta: Secondary | ICD-10-CM

## 2017-01-01 DIAGNOSIS — G9340 Encephalopathy, unspecified: Secondary | ICD-10-CM | POA: Diagnosis present

## 2017-01-01 LAB — BASIC METABOLIC PANEL
Anion gap: 7 (ref 5–15)
BUN: 15 mg/dL (ref 6–20)
CHLORIDE: 106 mmol/L (ref 101–111)
CO2: 23 mmol/L (ref 22–32)
Calcium: 8.5 mg/dL — ABNORMAL LOW (ref 8.9–10.3)
Creatinine, Ser: 1.4 mg/dL — ABNORMAL HIGH (ref 0.44–1.00)
GFR calc non Af Amer: 32 mL/min — ABNORMAL LOW (ref >60)
GFR, EST AFRICAN AMERICAN: 37 mL/min — AB (ref >60)
Glucose, Bld: 150 mg/dL — ABNORMAL HIGH (ref 65–99)
POTASSIUM: 5.2 mmol/L — AB (ref 3.5–5.1)
SODIUM: 136 mmol/L (ref 135–145)

## 2017-01-01 LAB — CBC
HEMATOCRIT: 26 % — AB (ref 36.0–46.0)
Hemoglobin: 8.4 g/dL — ABNORMAL LOW (ref 12.0–15.0)
MCH: 28.6 pg (ref 26.0–34.0)
MCHC: 32.3 g/dL (ref 30.0–36.0)
MCV: 88.4 fL (ref 78.0–100.0)
Platelets: 129 10*3/uL — ABNORMAL LOW (ref 150–400)
RBC: 2.94 MIL/uL — AB (ref 3.87–5.11)
RDW: 17.6 % — ABNORMAL HIGH (ref 11.5–15.5)
WBC: 5.5 10*3/uL (ref 4.0–10.5)

## 2017-01-01 LAB — LACTIC ACID, PLASMA: Lactic Acid, Venous: 1.7 mmol/L (ref 0.5–1.9)

## 2017-01-01 MED ORDER — DOCUSATE SODIUM 100 MG PO CAPS: 100.0000 mg | ORAL_CAPSULE | Freq: Every day | ORAL | Status: DC | PRN

## 2017-01-01 MED ORDER — ENSURE ENLIVE PO LIQD
237.0000 mL | Freq: Three times a day (TID) | ORAL | Status: DC
  Administered 2017-01-02 – 2017-01-04 (×5): 237 mL via ORAL

## 2017-01-01 MED ORDER — SODIUM CHLORIDE 0.9% FLUSH
3.0000 mL | Freq: Two times a day (BID) | INTRAVENOUS | Status: DC
  Administered 2017-01-02 – 2017-01-03 (×3): 3 mL via INTRAVENOUS

## 2017-01-01 MED ORDER — POLYETHYLENE GLYCOL 3350 17 G PO PACK: 17.0000 g | PACK | Freq: Every day | ORAL | Status: DC | PRN

## 2017-01-01 MED ORDER — SODIUM CHLORIDE 0.9 % IV SOLN
INTRAVENOUS | Status: AC
  Administered 2017-01-01: 13:00:00 via INTRAVENOUS

## 2017-01-01 MED ORDER — POLYMYXIN B-TRIMETHOPRIM 10000-0.1 UNIT/ML-% OP SOLN
1.0000 [drp] | OPHTHALMIC | Status: DC
  Administered 2017-01-01 – 2017-01-04 (×13): 1 [drp] via OPHTHALMIC
  Filled 2017-01-01 (×3): qty 10

## 2017-01-01 MED ORDER — PANTOPRAZOLE SODIUM 40 MG PO TBEC
40.0000 mg | DELAYED_RELEASE_TABLET | Freq: Every day | ORAL | Status: DC
  Administered 2017-01-02 – 2017-01-04 (×3): 40 mg via ORAL
  Filled 2017-01-01 (×4): qty 1

## 2017-01-01 MED ORDER — DICLOFENAC SODIUM 1 % TD GEL
2.0000 g | Freq: Three times a day (TID) | TRANSDERMAL | Status: DC
  Administered 2017-01-01 – 2017-01-03 (×5): 2 g via TOPICAL
  Filled 2017-01-01 (×3): qty 100

## 2017-01-01 MED ORDER — ASPIRIN EC 81 MG PO TBEC
81.0000 mg | DELAYED_RELEASE_TABLET | Freq: Every day | ORAL | Status: DC
  Administered 2017-01-02 – 2017-01-04 (×3): 81 mg via ORAL
  Filled 2017-01-01 (×4): qty 1

## 2017-01-01 NOTE — ED Notes (Addendum)
At approximately 11:40 am, patient daughter came out of room screaming for help. Upon entering room patient eyes rolled into back of head, snoring respirations, foaming at the mouth. EDP Plunkett at bedside, BVM applied and assisted respirations with nasal trumpet in place to left nare. BP 80's at that time. Per EDP give additional 500 cc bolus. Admitting team was paged and came to room. Advised team that patient needed higher level of care.

## 2017-01-01 NOTE — Progress Notes (Signed)
Subjective:   Pt was seen in the ER. One of the daughter at bedside. Pt  Was somewhat somnolent during our exam but was alert to place.   Pt's daughter says that pt lives alone, and has nurse come over twice a week. Her husband died in July 18, 1993 due to prostate cancer. Pt does have advanced directive and pt has indicated that she does not want prolonged ventilator, but she does want CPR.    Objective:  Vital signs in last 24 hours: Vitals:   01/01/17 0500 01/01/17 0600 01/01/17 0700 01/01/17 0800  BP: 122/74 119/65 121/64 101/61  Pulse: 98  68   Resp: 16 17 18 12   Temp:      TempSrc:      SpO2: 98%  97% 96%   General: Vital signs reviewed. Patient in no acute distress, sleeping comfortably- awakens to voice Cardiovascular: regular rate, rhythm, no murmur appreciated  Pulmonary/Chest: Clear to auscultation bilaterally, no wheezes, rales, or rhonchi. Abdominal: Soft, non-tender, non-distended, BS + Extremities:  RLE slightly more edematous than LLE . Some left sided weakness due to prior stroke    Assessment/Plan:  Principal Problem:   Unresponsive episode Active Problems:   Chronic combined systolic and diastolic CHF, NYHA class 2 (HCC)   Atrial fibrillation, controlled (HCC)   History of sick sinus syndrome   Normochromic anemia   CKD (chronic kidney disease) stage 3, GFR 30-59 ml/min   Hypertensive heart and kidney disease with biventricular heart failure and stage 3 chronic kidney disease ([place])  Ms. Manzi is an 83yo female with PMH significant for biventricular heart failure, hx of sick sinus syndrome s/p PPM, rate-controlled a-fib (not on anticoag due to hx of GI bleeding and fall risk), hx of R MCA stroke, CKD, and dementia who presents after an unresponsive episode witnessed by her daughter.  Orthostatic hypotension leading to syncope: Patient was found to be orthostatic in the ER and did have a postural change prior to the event (sitting/standing for a period of time  before lying down) and fall earlier in the day, which suggests orthostatic hypotension. She does have a history of a-fib and sick sinus syndrome, but PPM interrogated and no rapid ventricular events detected since May, making cardiogenic syncope less likely. Her lasix dose was recently reduced- but she is not exhibiting signs of heart failure. Her neuro exam was intact per admitting team, and head CT normal. Lactic acid has trended down.   -telemetry -Received 500 cc bolus last night- she was still orthostatic and so received another 500 cc bolus around 9:30 AM. We will repeat orthostatic vitals and gently hydrate her given her existing history of heart failure. Her blood pressure has been on the softer side. -check orthostatic vitals once more awake.   Biventricular heart failure, afib, currently with PPM: Hx of sick sinus syndrome s/p PPM, and history of Rate controlled a-fib Patient not currently on anticoagulation due to hx of GI bleeding and fall risk; is on aspirin 81mg  daily. Last echo 07/2016 with LVEF 45-50%, diffuse hypokinesis, PA peak pressure 35mmHg, mild AR, mitral valve annular calcification with mild regurgitation, moderate tricuspid valve regurgitation. Interrogation of PPM shows last episode in May 2018. Patient in afib with HR in 70s-80s. On lasix at home, recently decreased dose due to overdiuresis. Dry weight ~118-120 lbs. Pt currently at dry weight   - Cont home aspirin 81mg  daily - Hold home metoprolol 100mg  BID - Hold home lasix 40mg  daily due to orthostatic hypotension  -  daily weights   CKD: Cr 1.5. Baseline Cr ~1.5-1.6. - Continue to monitor - BMP tomorrow AM  Dementia: Daughter in room states that she returned to baseline in the ER here. She is oriented to name and place. Per prior clinic visits, She has some issues with memory.  She gets help with medications at home, but still lives independently. Her daughter denies any concerning behavioral issues in the visit in  July  - Delirium precautions  Anemia, chronic, normocytic Pt with normocytic anemia (Hgb 8.4) which has been steadily declining over the past several months, also low to borderline white cell and platelet counts. Normal iron profile not indicative ofIDA or ACD, likely . During August admission, further workup with myeloma panel was unrevealing and peripheral blood smear and normal free light chain ratio is reassuring. Her HgB today is currently at baseline.   HTN: stable, slightly on the lower side currently  -Holding lasix and metoprolol BID    Dispo: Anticipated discharge in approximately 1 day(s).   Burgess Estelle, MD 01/01/2017, 10:45 AM

## 2017-01-01 NOTE — Progress Notes (Signed)
PT Cancellation Note  Patient Details Name: [name] MRN: [id number] DOB: 22-Nov-1925   Cancelled Treatment:    Reason Eval/Treat Not Completed: Patient not medically ready. Discussed with RN who states pt is unable to tolerate sitting up EOB 2 orthostatic hypotension. Will hold PT eval at this time and check back 9/17 as able.   Thelma Comp 01/01/2017, 2:07 PM   Rolinda Roan, PT, DPT Acute Rehabilitation Services Pager: [phone]

## 2017-01-01 NOTE — H&P (Signed)
Date: 01/01/2017               Patient Name:  [name] MRN: [id number]  DOB: 26-Mar-1926 Age / Sex: 83 y.o., female   PCP: Sid Falcon, MD         Medical Service: Internal Medicine Teaching Service         Attending Physician: Dr. Lucious Groves, DO    First Contact: Dr. Isac Sarna Pager: [phone]  Second Contact: Dr. Tiburcio Pea Pager: [phone]       After Hours (After 5p/  First Contact Pager: [phone]  weekends / holidays): Second Contact Pager: [phone]   Chief Complaint: unresponsive episode  History of Present Illness:  [name] is an 83yo female with PMH significant for biventricular heart failure (last EF 07/2016 was 45-50%), hx of sick sinus syndrome s/p single-chamber PPM, rate-controlled a-fib (not on anticoag due to hx of GI bleeding and fall risk), hx of R MCA stroke, CKD, and dementia who presents after an unresponsive episode witnessed by her daughter. History obtained from patient and 2 daughters.   Daughter states that they were at home in the kitchen when patient walked to bedroom to lay down in bed to go to sleep. The daughter and patient were talking for a period of time when the daughter noticed that the patient's eyes rolled back and she became unresponsive. She stated her mother was not breathing. She called her sister who told her to call 911. She called 911 and was told to initiate CPR. Paramedics arrived and reported that the patient had a pulse but required respiratory support. After placing her on oxygen, she became more responsive but was sluggish. Daughter states that she estimates the patient was non-responsive for about 8 minutes. The daughter states the patient was without complaints prior to the unresponsive episode. Denies shaking, incontinence, tongue biting, or confusion.  In the ER, BP 109/60, temp 97.5, HR 80, RR 18, O2 sats 100% on RA. Her mental status was reportedly back to baseline. Orthostatics positive. Received 500cc IV NS  bolus. Interrogation of PPM showed no abnormality. CBC, BMP, CBG wnl. Trop neg x1, lactic acid 2.35. CXR showed stable cardiomegaly and mild central vascular congestion with new small to moderate R pleural effusion. EKG with a-fib.  Patient was just seen in the ER earlier this morning after a mechanical fall, where she sustained a laceration to her right forehead. CT head and cervical spine with no acute findings. CBC and BMP at the time wnl. The laceration was repaired and she was discharged home. Daughters state that the patient was complaining of a headache and some right sided chest/axillary pain after the fall.  The patient does not remember what happened. She thinks she is in the hospital because she had a fall. She endorses right-sided chest/axillary pain, leg swelling that is chronic, and feeling cold and tired. Denies shortness of breath, palpitations, headache, dizziness, lightheadedness, abdominal pain, N/V, changes in BMs, bloody stools, fevers, or dysuria. She does state that she sometimes gets short of breath when walking around, but that this has been stable. She sleeps on 2 pillows at home but was encouraged by her doctor to sleep on 3.  Patient lives at home alone, but her daughters switch off in staying the night with her. Daughters state that she has become more confused the last few months. The daughters help her with medications. Patient normally ambulates with a cane, can dress herself, needs assistance with cooking/cleaning. Patient  does not smoke or use illicit drugs. Occasional alcohol use.  Meds:  Current Meds  Medication Sig  . aspirin EC 81 MG tablet Take 81 mg by mouth daily.  . diclofenac sodium (VOLTAREN) 1 % GEL Apply 2 g topically 4 (four) times daily.  . furosemide (LASIX) 20 MG tablet Take 2 tablets (40 mg total) by mouth daily.  . metoprolol tartrate (LOPRESSOR) 100 MG tablet TAKE 1 TABLET(100 MG) BY MOUTH TWICE DAILY  . Nutritional Supplements ([place]) LIQD Take 237 mLs by mouth 3 (three) times daily.  . pantoprazole (PROTONIX) 40 MG tablet Take 1 tablet (40 mg total) by mouth daily.  . potassium chloride 20 MEQ/15ML (10%) SOLN TAKE 15 MLS BY MOUTH DAILY (Patient taking differently: Take 20 mEq by mouth daily. )  . trimethoprim-polymyxin b (POLYTRIM) ophthalmic solution Place 1 drop into both eyes every 4 (four) hours.   Allergies: Allergies as of 12/31/2016  . (No Known Allergies)   Past Medical History:  Diagnosis Date  . Adenomatous colon polyp    tubular  . Allergic rhinitis   . Anemia    Iron deficiency, does not tolerate po  . Arthritis    "all over my body"  . Atrial fibrillation (HCC)    on coumadin followed by Dr. Elie Confer  . CHF (congestive heart failure) (HCC)    diastolic dysfunction  . Constipation   . Dementia   . Diverticulosis of colon (without mention of hemorrhage)   . DJD (degenerative joint disease)   . GERD (gastroesophageal reflux disease)   . History of sick sinus syndrome s/p pacemaker Dr. Lovena Le 2000  . History of trichomonal urethritis   . History of vaginal bleeding 05/2005   nagative endometrial biopsy  . Hypertension    well controlled on 3 agents  . Incontinence overflow, stress female    on vesicare  . Onychomycosis   . Osteoporosis   . Pacemaker   . Restrictive lung disease 1996   PFT's showed mild disease  . Stroke [facility]) 2002   R MCA, cardioembolic  . Type II diabetes mellitus (HCC)    diet controlled  . Weight loss, unintentional 2010-2011   8/08-8/10 138-146 lbs, between 8/10-10/11 lost 18 lbs., work-up negative   Family History:  Family History  Problem Relation Age of Onset  . Diabetes Sister   . Stroke Sister   . Heart disease Unknown   . Heart disease Father    Social History:  Social History   Social History  . Marital status: Widowed    Spouse name: N/A  . Number of children: 8  . Years of education: N/A   Occupational History  . retired Retired   Social  History Main Topics  . Smoking status: Never Smoker  . Smokeless tobacco: Never Used  . Alcohol use 1.2 oz/week    2 Cans of beer per week  . Drug use: No  . Sexual activity: No   Other Topics Concern  . None   Social History Narrative   [employer]   [name] home# [phone]   Cell# [phone]      Patient lives alone, but has 7, of 8, living children who check in on her.   She is widowed.   Review of Systems: A complete ROS was negative except as per HPI.  Physical Exam: Blood pressure 116/64, pulse (!) 113, temperature (!) 97.5 F (36.4 C), temperature source Oral, resp. rate (!) 21, SpO2 (!) 83 %.  Orthostatics: 108/59,  71 lying; 105/54, 78 sitting; 82/54, 92 standing  GEN: Elderly female sleeping in bed comfortably in NAD. Awakens to voice. Oriented to self and location. HEAD: Bandage on R forehead. RESP: Clear to auscultation bilaterally. Diminished breath sounds. Normal work of breathing. CV: Irregularly irregular rhythm. No murmurs, gallops, or rubs. 3+ pitting BLE edema. ABD: Soft. Non-tender. Non-distended. Normoactive bowel sounds. EXT: 3+ BLE edema. Hands cold. NEURO: Cranial nerves II-XII grossly intact. 4/5 strength in LUE (hx of R MCA stroke), 5/5 strength in RUE. 5/5 strength in BLE. Normal sensation in all extremities. Speech fluent and appropriate.  Labs CBC Latest Ref Rng & Units 12/31/2016 12/31/2016 11/18/2016  WBC 4.0 - 10.5 K/uL 4.6 3.6(L) 4.4  Hemoglobin 12.0 - 15.0 g/dL 8.8(L) 8.5(L) 8.5(L)  Hematocrit 36.0 - 46.0 % 27.3(L) 25.5(L) 26.3(L)  Platelets 150 - 400 K/uL 163 143(L) 141(L)   BMP Latest Ref Rng & Units 12/31/2016 12/31/2016 11/23/2016  Glucose 65 - 99 mg/dL 139(H) 91 113(H)  BUN 6 - 20 mg/dL 14 15 33(H)  Creatinine 0.44 - 1.00 mg/dL 1.51(H) 1.22(H) 1.99(H)  BUN/Creat Ratio 12 - 28 - - 17  Sodium 135 - 145 mmol/L 137 137 139  Potassium 3.5 - 5.1 mmol/L 4.5 4.3 4.1  Chloride 101 - 111 mmol/L 102 106 97  CO2 22 - 32 mmol/L 27 22 25   Calcium  8.9 - 10.3 mg/dL 8.7(L) 8.8(L) 9.4   Lactic acid 2.35 Troponin neg x1 UA pending  EKG: a-fib  CXR: stable cardiomegaly with aortic atherosclerosis. Mild central vascular congestion with new small-moderate R pleural effusion.  CT-head/cervical spine (from earlier this morning) Head CT: No intracranial acute or traumatic finding. Old right MCA territory infarction. Right frontal scalp injury. Cervical spine CT: No acute or traumatic finding. Chronic degenerative spondylosis.  Assessment & Plan by Problem: Active Problems:   Unresponsive episode  Ms. Sipe is an 83yo female with PMH significant for biventricular heart failure, hx of sick sinus syndrome s/p PPM, rate-controlled a-fib (not on anticoag due to hx of GI bleeding and fall risk), hx of R MCA stroke, CKD, and dementia who presents after an unresponsive episode witnessed by her daughter.  # Unresponsive episode DDx: most likely vasovagal syncope vs orthostatic hypotension. Less likely cardiogenic syncope History surrounding event is difficult to obtain due to patient's underlying dementia. Patient was found to be orthostatic in the ER and did have a postural change prior to the event (sitting/standing for a period of time before lying down) and fall earlier in the day, which suggests orthostatic hypotension or vasovagal syncope. Do not know if patient experienced prodromal symptoms prior. She does have a history of a-fib and sick sinus syndrome, but PPM interrogated and no rapid ventricular events detected since May, making cardiogenic syncope less likely. Patient does have a history of biventricular heart failure with recently reduced lasix dose, but less likely hypoxia given patient not complaining of worsened shortness of breath. Less likely intracranial bleed after fall given normal head CT earlier today. Less likely seizure given no symptoms. Less likely stroke with no weakness on exam. Less likely MI/PE given return to baseline and  troponin negative x1, EKG with a-fib without RVR, although patient not on warfarin. Initial lactate 2.35, sats >90% on RA, BP stable. - Telemetry - Continue to monitor O2 sats - Trend lactic acid - EKG tomorrow AM - CBC & BMP tomorrow AM - f/u UA - Neuro checks q4h  # Biventricular heart failure Hx of sick sinus syndrome s/p  PPM Rate controlled a-fib Patient not currently on anticoagulation due to hx of GI bleeding and fall risk; is on aspirin 81mg  daily. Last echo 07/2016 with LVEF 45-50%, diffuse hypokinesis, PA peak pressure 25mmHg, mild AR, mitral valve annular calcification with mild regurgitation, moderate tricuspid valve regurgitation. Interrogation of PPM shows last episode in May 2018. Patient in afib with HR in 70s-80s. On lasix at home, recently decreased dose due to overdiuresis. Dry weight ~118-120 lbs. - Cont home aspirin 81mg  daily - Hold home metoprolol 100mg  BID - Hold home lasix 40mg  daily - daily weights  # CKD Cr 1.5. Baseline Cr ~1.5-1.6. - Continue to monitor - BMP tomorrow AM  # Dementia Daughter in room states that she returned to baseline in the ER here. She is oriented to name and place. - Delirium precautions  Diet: HH diet VTE PPx: SCDs  Dispo: Admit patient to Observation with expected length of stay less than 2 midnights.  Signed: Colbert Ewing, MD  01/01/2017, 12:44 AM  P [phone]

## 2017-01-01 NOTE — ED Notes (Signed)
Ordered breakfast tray  

## 2017-01-01 NOTE — Progress Notes (Signed)
   01/01/17 1300  Clinical Encounter Type  Visited With Patient and family together  Visit Type Initial;Death  Referral From Nurse  Spiritual Encounters  Spiritual Needs Prayer;Literature  Stress Factors  Patient Stress Factors Not reviewed  Family requested chaplain come and have prayer with them. Lots of family.  I brought a NT/Psalms Bible to share from. I was planning to read a selection but was paged to another call. I had prayer and gave the Bible to one of the family members who wanted to read Psalm 90 and other scriptures.   Conard Novak, Chaplain

## 2017-01-01 NOTE — ED Notes (Signed)
On rounds this am, IM requesting new set of orthostatic vitals to be completed and if patient remained positive would need to hang 500 cc bolus of NS. Pt positive orthostatic on standing, BP decreased into 80's, patient did not tolerate well at all. +Dizziness and +near syncope. Patient stated "feels like I'm heading onto heaven." Multiple attempts have been made to contact admitting, still pending return call. Pt is alert and BP stabilized at current time with 500 cc fluid bolus infusing. Patient denies urge to urinate, states has not urinated through the night.

## 2017-01-01 NOTE — Progress Notes (Addendum)
Interim Note  I was paged by the RN, that at around 11:40 AM, they noticed that for about 30 seconds, the patient rolled back her eyes, and had some foaming around the mouth and for few seconds, they thought the pt did not have pulse, so she had few seconds of CPR. I evaluated the patient at beside- the daughter was at bedside who repeated the events above.  Patient was following my commands- she was alert to name and place, but not year.    Objective:  BP 108/60   Pulse (!) 36   Temp (!) 97.5 F (36.4 C) (Oral)   Resp 19   SpO2 95%  Telemetry review showed a paced rhythm at the time of the episode and pulse of 72  Neurologic exam: MS: A&O to person, place but not year and president CN II-XII grossly intact- pupils unable to be visualized due to significant cataracts  Motor: 5/5 strength in upper right, 3-4/5 in upper left , 4-5/5 in lower extremities Cerebellar: normal FNF   Overall, we still think that her symptoms are due to orthostatic hypotension. Patient does have prior stroke leading to some residual left sided weakness and her neuro exam on my evaluation was similar to the prior exams. However, we do not think that she is having any seizure activity.  SHe had a CT head performed yesterday after the fall which was negative. THe patient's daughter denies any further falls since being discharged from the ER yesterday to home.   Plan -We will continue to monitor her overnight in telemetry unit -add seizure precautions, and neuro checks every 4 hours  -She is s/p 500 cc normal saline again -gentle hydration at NS 50 cc/hr for 12 hours  -needs continuing goals of care discussion- as currently daughter wants to keep her full code -We will hold off on EEG currently- If patient has further episodes like this- then can consider EEG- but the risks of adding antiepileptic medications in this patient with multiple comorbidities may outweigh the benefits   Signed Burgess Estelle MD

## 2017-01-02 ENCOUNTER — Encounter (HOSPITAL_COMMUNITY): Payer: Self-pay

## 2017-01-02 DIAGNOSIS — Z8719 Personal history of other diseases of the digestive system: Secondary | ICD-10-CM | POA: Diagnosis not present

## 2017-01-02 DIAGNOSIS — R55 Syncope and collapse: Secondary | ICD-10-CM | POA: Diagnosis not present

## 2017-01-02 DIAGNOSIS — Z8673 Personal history of transient ischemic attack (TIA), and cerebral infarction without residual deficits: Secondary | ICD-10-CM | POA: Diagnosis not present

## 2017-01-02 DIAGNOSIS — F0390 Unspecified dementia without behavioral disturbance: Secondary | ICD-10-CM | POA: Diagnosis not present

## 2017-01-02 DIAGNOSIS — I130 Hypertensive heart and chronic kidney disease with heart failure and stage 1 through stage 4 chronic kidney disease, or unspecified chronic kidney disease: Secondary | ICD-10-CM

## 2017-01-02 DIAGNOSIS — I4891 Unspecified atrial fibrillation: Secondary | ICD-10-CM | POA: Diagnosis not present

## 2017-01-02 DIAGNOSIS — D649 Anemia, unspecified: Secondary | ICD-10-CM | POA: Diagnosis not present

## 2017-01-02 DIAGNOSIS — I5082 Biventricular heart failure: Secondary | ICD-10-CM | POA: Diagnosis not present

## 2017-01-02 DIAGNOSIS — Z9181 History of falling: Secondary | ICD-10-CM | POA: Diagnosis not present

## 2017-01-02 DIAGNOSIS — Z950 Presence of cardiac pacemaker: Secondary | ICD-10-CM | POA: Diagnosis not present

## 2017-01-02 DIAGNOSIS — N189 Chronic kidney disease, unspecified: Secondary | ICD-10-CM | POA: Diagnosis not present

## 2017-01-02 DIAGNOSIS — Z8679 Personal history of other diseases of the circulatory system: Secondary | ICD-10-CM | POA: Diagnosis not present

## 2017-01-02 LAB — BASIC METABOLIC PANEL
ANION GAP: 9 (ref 5–15)
BUN: 19 mg/dL (ref 6–20)
CHLORIDE: 108 mmol/L (ref 101–111)
CO2: 21 mmol/L — ABNORMAL LOW (ref 22–32)
Calcium: 8.3 mg/dL — ABNORMAL LOW (ref 8.9–10.3)
Creatinine, Ser: 1.84 mg/dL — ABNORMAL HIGH (ref 0.44–1.00)
GFR calc non Af Amer: 23 mL/min — ABNORMAL LOW (ref >60)
GFR, EST AFRICAN AMERICAN: 27 mL/min — AB (ref >60)
Glucose, Bld: 95 mg/dL (ref 65–99)
POTASSIUM: 4.7 mmol/L (ref 3.5–5.1)
SODIUM: 138 mmol/L (ref 135–145)

## 2017-01-02 LAB — CBC
HCT: 27.5 % — ABNORMAL LOW (ref 36.0–46.0)
HEMOGLOBIN: 8.8 g/dL — AB (ref 12.0–15.0)
MCH: 28.5 pg (ref 26.0–34.0)
MCHC: 32 g/dL (ref 30.0–36.0)
MCV: 89 fL (ref 78.0–100.0)
PLATELETS: 146 10*3/uL — AB (ref 150–400)
RBC: 3.09 MIL/uL — AB (ref 3.87–5.11)
RDW: 17.6 % — ABNORMAL HIGH (ref 11.5–15.5)
WBC: 4.6 10*3/uL (ref 4.0–10.5)

## 2017-01-02 LAB — URINALYSIS, ROUTINE W REFLEX MICROSCOPIC
GLUCOSE, UA: NEGATIVE mg/dL
HGB URINE DIPSTICK: NEGATIVE
Ketones, ur: NEGATIVE mg/dL
NITRITE: NEGATIVE
PH: 6 (ref 5.0–8.0)
PROTEIN: 100 mg/dL — AB
Specific Gravity, Urine: 1.013 (ref 1.005–1.030)

## 2017-01-02 LAB — MRSA PCR SCREENING: MRSA by PCR: POSITIVE — AB

## 2017-01-02 MED ORDER — MUPIROCIN 2 % EX OINT
1.0000 "application " | TOPICAL_OINTMENT | Freq: Two times a day (BID) | CUTANEOUS | Status: DC
  Administered 2017-01-02 – 2017-01-04 (×4): 1 via NASAL
  Filled 2017-01-02 (×2): qty 22

## 2017-01-02 MED ORDER — CHLORHEXIDINE GLUCONATE CLOTH 2 % EX PADS: 6.0000 | MEDICATED_PAD | Freq: Every day | CUTANEOUS | Status: DC

## 2017-01-02 NOTE — Progress Notes (Signed)
PT Note Orthostatic BPs  Supine 113/75, 85 bpm  Sitting 106/67, 94 bpm  Standing 111/60, 93 bpm  Standing after 3 min 115/56, 94 bpm   Pt ambulated after above pressures taken.  C/o dizziness when walking.  BP upon return to room was 84/50 with HR 84.  O2 sats fluctuating between 78-86% on RA with ambulation and generally not picking up entire time PT was in room.  Took BP again after pt sitting for 2 min with feet reclined and BP 98/57 with HR 94 bpm.  This PT tried multiple fingers, ear and toe to get pulse ox reading and never found any that read well.  Nurse made aware.  Full note to follow.  Thanks  [facility] [phone] (pager)

## 2017-01-02 NOTE — Evaluation (Signed)
Physical Therapy Evaluation Patient Details Name: [name] MRN: [id number] DOB: [birth year] Today's Date: 01/02/2017   History of Present Illness  Pt admit with episode of orthostatic hypotension leading to syncope.  Past Medical History:  Diagnosis Date  . Adenomatous colon polyp    tubular  . Allergic rhinitis   . Anemia    Iron deficiency, does not tolerate po  . Arthritis    "all over my body"  . Atrial fibrillation (HCC)    on coumadin followed by Dr. Elie Confer  . CHF (congestive heart failure) (HCC)    diastolic dysfunction  . Constipation   . Dementia   . Diverticulosis of colon (without mention of hemorrhage)   . DJD (degenerative joint disease)   . GERD (gastroesophageal reflux disease)   . History of sick sinus syndrome s/p pacemaker Dr. Lovena Le 2000  . History of trichomonal urethritis   . History of vaginal bleeding 05/2005   nagative endometrial biopsy  . Hypertension    well controlled on 3 agents  . Incontinence overflow, stress female    on vesicare  . Onychomycosis   . Osteoporosis   . Pacemaker   . Restrictive lung disease 1996   PFT's showed mild disease  . Stroke [facility]) 2002   R MCA, cardioembolic  . Type II diabetes mellitus (HCC)    diet controlled  . Weight loss, unintentional 2010-2011   8/08-8/10 138-146 lbs, between 8/10-10/11 lost 18 lbs., work-up negative    Clinical Impression  Pt admitted with above diagnosis. Pt currently with functional limitations due to the deficits listed below (see PT Problem List). Pt was able to ambulate but her BP did drop once up and walking.  Pt dizzy and DOE 2/4 and pt barely able to make it back to bed.  O2 sats reading 78-86% but poor waveform.  Tried multiple probes on pts' fingers and would get reading and then it would fade.  Nursing agreed to leave pt on 2L given that it was fluctuating so much.  Pt 94% on 2L on departure.  Difficult to perform walking sats due to this issue with O2 picking up.  MD aware of  BP drop and pulse ox issues.  Family supportive and pt has equipment.  Pt will benefit from skilled PT to increase their independence and safety with mobility to allow discharge to the venue listed below.    Orthostatic BPs  Supine 113/75, 85 bpm  Sitting 106/67, 94 bpm  Standing 111/60, 93 bpm  Standing after 3 min 115/56, 94 bpm   Pt ambulated after above pressures taken.  C/o dizziness when walking.  BP upon return to room was 84/50 with HR 84.  O2 sats fluctuating between 78-86% on RA with ambulation and generally not picking up entire time PT was in room.  Took BP again after pt sitting for 2 min with feet reclined and BP 98/57 with HR 94 bpm.  This PT tried multiple fingers, ear and toe to get pulse ox reading and never found any that read well.  Nurse made aware.    Follow Up Recommendations Home health PT;Supervision/Assistance - 24 hour (HHOT)    Equipment Recommendations  Other (comment) (shower chair to be assessed by [facility])    Recommendations for Other Services       Precautions / Restrictions Precautions Precautions: Fall Restrictions Weight Bearing Restrictions: No      Mobility  Bed Mobility Overal bed mobility: Needs Assistance Bed Mobility: Supine to Sit  Supine to sit: Min guard     General bed mobility comments: Took incr time but did perform without help  Transfers Overall transfer level: Needs assistance Equipment used: Rolling walker (2 wheeled) Transfers: Sit to/from Stand Sit to Stand: Min guard         General transfer comment: guard assist for safety but pt did well.   Ambulation/Gait Ambulation/Gait assistance: Min guard;Min assist Ambulation Distance (Feet): 35 Feet Assistive device: Rolling walker (2 wheeled) Gait Pattern/deviations: Step-through pattern;Decreased stride length;Drifts right/left;Trunk flexed   Gait velocity interpretation: Below normal speed for age/gender General Gait Details: Initially, pt doing well and good  walker safety.  Pt c/o dizziness once at door and turned around to go back to room.  Pt became dyspneic after a few more steps.  Pt was able to return to seat and sit but hurried last few steps needing min guard assist for safety as she was reaching further than was safe. At this point, BP had dropped.  Sats on RA picked up as 78-87 with ambulation but sat monitor not picking up well the entire treatment even though 5 different places on extremities were tried. Placed pt back on O2 at 2L per nurse request.  Nurse aware of sat issues.    Stairs            Wheelchair Mobility    Modified Rankin (Stroke Patients Only)       Balance Overall balance assessment: Needs assistance;History of Falls Sitting-balance support: No upper extremity supported;Feet supported Sitting balance-Leahy Scale: Fair     Standing balance support: Bilateral upper extremity supported;During functional activity Standing balance-Leahy Scale: Fair Standing balance comment: Stood without RW with fair static stance.              High level balance activites: Direction changes;Turns;Sudden stops High Level Balance Comments: min guard assist with above.              Pertinent Vitals/Pain Pain Assessment: No/denies pain    Home Living Family/patient expects to be discharged to:: Private residence Living Arrangements: Alone Available Help at Discharge: Family;Available PRN/intermittently Type of Home: House Home Access: Level entry     Home Layout: One level Home Equipment: Cane - single point;Bedside commode;Walker - 4 wheels;Walker - 2 wheels      Prior Function Level of Independence: Independent with assistive device(s) (Pt used cane PTA)               Hand Dominance   Dominant Hand: Right    Extremity/Trunk Assessment   Upper Extremity Assessment Upper Extremity Assessment: Defer to OT evaluation    Lower Extremity Assessment Lower Extremity Assessment: RLE  deficits/detail RLE Deficits / Details: grossly 3-/5    Cervical / Trunk Assessment Cervical / Trunk Assessment: Normal  Communication   Communication: HOH  Cognition Arousal/Alertness: Lethargic Behavior During Therapy: Flat affect Overall Cognitive Status: History of cognitive impairments - at baseline                                        General Comments      Exercises General Exercises - Lower Extremity Ankle Circles/Pumps: AROM;Both;5 reps;Supine   Assessment/Plan    PT Assessment Patient needs continued PT services  PT Problem List Decreased activity tolerance;Decreased mobility;Decreased balance;Decreased knowledge of use of DME;Decreased safety awareness;Decreased knowledge of precautions;Cardiopulmonary status limiting activity       PT  Treatment Interventions DME instruction;Gait training;Functional mobility training    PT Goals (Current goals can be found in the Care Plan section)  Acute Rehab PT Goals Patient Stated Goal: to go home PT Goal Formulation: With patient Time For Goal Achievement: 01/16/17 Potential to Achieve Goals: Good    Frequency Min 3X/week   Barriers to discharge        Co-evaluation               AM-PAC PT "6 Clicks" Daily Activity  Outcome Measure Difficulty turning over in bed (including adjusting bedclothes, sheets and blankets)?: None Difficulty moving from lying on back to sitting on the side of the bed? : A Little Difficulty sitting down on and standing up from a chair with arms (e.g., wheelchair, bedside commode, etc,.)?: A Little Help needed moving to and from a bed to chair (including a wheelchair)?: A Little Help needed walking in hospital room?: A Little Help needed climbing 3-5 steps with a railing? : A Lot 6 Click Score: 18    End of Session Equipment Utilized During Treatment: Gait belt;Oxygen Activity Tolerance: Patient limited by fatigue Patient left: in chair;with call bell/phone within  reach;with family/visitor present;with nursing/sitter in room Nurse Communication: Mobility status PT Visit Diagnosis: Unsteadiness on feet (R26.81);Muscle weakness (generalized) (M62.81)    Time: 8185-6314 PT Time Calculation (min) (ACUTE ONLY): 53 min   Charges:   PT Evaluation $PT Eval Moderate Complexity: 1 Mod PT Treatments $Gait Training: 23-37 mins $Therapeutic Activity: 8-22 mins   PT G Codes:   PT G-Codes **NOT FOR INPATIENT CLASS** Functional Assessment Tool Used: AM-PAC 6 Clicks Basic Mobility Functional Limitation: Mobility: Walking and moving around Mobility: Walking and Moving Around Current Status (H7026): At least 1 percent but less than 20 percent impaired, limited or restricted Mobility: Walking and Moving Around Goal Status [phone]): At least 1 percent but less than 20 percent impaired, limited or restricted    [place] [phone] (pager)   Denice Paradise 01/02/2017, 4:14 PM

## 2017-01-02 NOTE — ED Notes (Signed)
Pt able to stand this am to do orthostatic , pt c/o some slight dizziness

## 2017-01-02 NOTE — ED Notes (Signed)
Report called  

## 2017-01-02 NOTE — Care Management Obs Status (Signed)
[place] NOTIFICATION   Patient Details  Name: SHERLENE RICKEL MRN: [id number] Date of Birth: Jan 17, 1926   Medicare Observation Status Notification Given:  Yes    Maryclare Labrador, RN 01/02/2017, 3:22 PM

## 2017-01-02 NOTE — Care Management Note (Signed)
Case Management Note  Patient Details  Name: [name] MRN: [id number] Date of Birth: April 03, 1926  Subjective/Objective:      Pt admitted with syncope              Action/Plan:   PTA from home alone independent - however children see mom everyday and pitch in when/where needed.  Daughter states that family can provide 24 hour supervision if needed at discharge.  Pt already has wheelchair, walker and cane in the home.  Pt is active with [facility] for [facility].  PT in the room during assessment and are recommending PT and OT - CM requested orders.  [facility] contacted - referral accepted and CM informed agency of discharge today.  Pt will transport home via private vehicle.  PT also currently assessing for need of oxygen in the home - CM consult will be ordered if deemed necessary.  CM will continue to follow for discharge needs    Expected Discharge Date:                  Expected Discharge Plan:  [place]  In-House Referral:     Discharge planning Services  CM Consult  Post Acute Care Choice:    Choice offered to:  Patient  DME Arranged:    DME Agency:     HH Arranged:  RN, PT, OT HH Agency:  [facility]  Status of Service:  In process, will continue to follow  If discussed at Long Length of Stay Meetings, dates discussed:    Additional Comments:  Maryclare Labrador, RN 01/02/2017, 3:15 PM

## 2017-01-02 NOTE — Consult Note (Addendum)
   [facility] CM Inpatient Consult   01/02/2017  [name] Jul 26, 1925 [id number]   Received call from inpatient RNCM for potential [facility] Care Management services. Also indicates patient is discharging today.  Chart reviewed and [name] was active with [facility] in the past. However, it appears she enrolled with [facility],outpatient palliative care services thru [place].  Telephone call made to [facility] at [phone]. Spoke with Tammy. Confirmed that [name] is indeed enrolled with [facility]. Made Tammy aware of pending hospital discharge today.  Due to patient being enrolled in [facility], home based palliative care program, [facility] Care Management not appropriate at this time.  Spoke with inpatient RNCM, Aldona Bar, to make aware of above.    Marthenia Rolling, MSN-Ed, RN,BSN [facility] Liaison [phone]

## 2017-01-02 NOTE — Care Management Obs Status (Signed)
[place] NOTIFICATION   Patient Details  Name: [name] MRN: [id number] Date of Birth: 04-03-1926   Medicare Observation Status Notification Given:  Yes  Daughter signed for mother    Oneal Grout 01/02/2017, 3:22 PM

## 2017-01-02 NOTE — ED Notes (Signed)
Pt had full linen change.

## 2017-01-02 NOTE — Progress Notes (Signed)
  Date: 01/02/2017  Patient name: CHEYEANNE ROADCAP  Medical record number: [id number]  Date of birth: [birth year]   I have seen and evaluated this patient and I have discussed the plan of care with the house staff. Please see their note for complete details. I concur with their findings with the following additions/corrections: Ms Wainright was seen in ED on AM rounds. No further incidents and no longer orthostatic. Neuro exam non focal except not oriented to yr but strength 5/5. Daughter states pt at baseline. PT consult (pt lives alone, some dementia, able to walk with cane) then D/C.   Bartholomew Crews, MD 01/02/2017, 3:18 PM

## 2017-01-02 NOTE — Discharge Summary (Signed)
Name: [name] MRN: [id number] DOB: Mar 18, 1926 83 y.o. PCP: Sid Falcon, MD  Date of Admission: 12/31/2016  8:51 PM Date of Discharge: 01/02/2017 Attending Physician: Bartholomew Crews, MD  Discharge Diagnosis: 1.Syncope  Principal Problem:   Syncope Active Problems:   Chronic combined systolic and diastolic CHF, NYHA class 2 (HCC)   Atrial fibrillation, controlled (HCC)   History of sick sinus syndrome   Normochromic anemia   CKD (chronic kidney disease) stage 3, GFR 30-59 ml/min   Hypertensive heart and kidney disease with biventricular heart failure and stage 3 chronic kidney disease ([place])   Acute encephalopathy   Discharge Medications: Allergies as of 01/04/2017   No Known Allergies     Medication List    STOP taking these medications   furosemide 20 MG tablet Commonly known as:  LASIX   potassium chloride 20 MEQ/15ML (10%) Soln   trimethoprim-polymyxin b ophthalmic solution Commonly known as:  POLYTRIM     TAKE these medications   aspirin EC 81 MG tablet Take 81 mg by mouth daily.   cephALEXin 250 MG capsule Commonly known as:  KEFLEX Take 1 capsule (250 mg total) by mouth 2 (two) times daily. Until 9/24   diclofenac sodium 1 % Gel Commonly known as:  VOLTAREN Apply 2 g topically 4 (four) times daily.   docusate sodium 100 MG capsule Commonly known as:  COLACE Take 1 capsule (100 mg total) by mouth daily as needed. What changed:  reasons to take this   [place] Take 237 mLs by mouth 3 (three) times daily.   loratadine 5 MG chewable tablet Commonly known as:  CLARITIN Chew 1 tablet (5 mg total) by mouth daily.   metoprolol tartrate 25 MG tablet Commonly known as:  LOPRESSOR Take 1 tablet (25 mg total) by mouth 2 (two) times daily. What changed:  medication strength  See the new instructions.   pantoprazole 40 MG tablet Commonly known as:  PROTONIX Take 1 tablet (40 mg total) by mouth daily.     polyethylene glycol packet Commonly known as:  MIRALAX / GLYCOLAX Take 17 g by mouth daily as needed for mild constipation.            Durable Medical Equipment        Start     Ordered   01/04/17 1124  For home use only DME Tub bench  Once     01/04/17 1123       Discharge Care Instructions        Start     Ordered   01/04/17 0000  cephALEXin (KEFLEX) 250 MG capsule  2 times daily     01/04/17 1337   01/04/17 0000  metoprolol tartrate (LOPRESSOR) 25 MG tablet  2 times daily     01/04/17 1337   01/04/17 0000  Increase activity slowly     01/04/17 1406   01/04/17 0000  Diet - low sodium heart healthy     01/04/17 1406   01/04/17 0000  Call MD for:  difficulty breathing, headache or visual disturbances     01/04/17 1406   01/04/17 0000  Call MD for:  persistant dizziness or light-headedness     01/04/17 1406   01/04/17 0000  (HEART FAILURE PATIENTS) Call MD:  Anytime you have any of the following symptoms: 1) 3 pound weight gain in 24 hours or 5 pounds in 1 week 2) shortness of breath, with or without a dry hacking cough  3) swelling in the hands, feet or stomach 4) if you have to sleep on extra pillows at night in order to breathe.     01/04/17 1406      Disposition and follow-up:   Ms.Terrina E Brislin was discharged from [facility] in Stable condition.  At the hospital follow up visit please address:  1.  Please assess ongoing dizziness and symptoms of orthostatic hypotension. Please obtain orthostatics.    2. Please assess ongoing urinary symptoms and completion of antibiotic therapy.   3. Home metoprolol held during admission due to orthostasis and patient developed Afib with RVR with HR up to 115. Patient started on lower dose of metoprolol 25 mg bid on day of discharge. Please assess HR and chest pain/palpitation/SOB. Uptitrate metoprolol as able. Home dose 100 mg bid.   4. Home Lasix 40 mg QD held during admission due to orthostasis. Lasix was  not started on discharge. Please assess volume status and renal function and resume home lasix if able. Would resume at lower dose as last echo showed decreased RV systolic function and patient is preload dependent.    5.  Labs / imaging needed at time of follow-up: BMP  6.  Pending labs/ test needing follow-up: None   Follow-up Appointments: [place] up.   Specialty:  [place] Why:  Resumption of Care for Home Health RN, Physical Therapy and Occupational Therapy (agency will call you to arrange appointments) local # [phone] Contact information: [place] [address] [place] Follow up.   Why:  will deliver a tub bench to your hospital room prior to discharge Contact information: [address] [place] [address] [phone]        [facility] Follow up.   Why:  You have a hospital follow-up appointment on 9/26 at 2:45pm Contact information: [address] [place] Hospital Course by problem list:   [name] is an 83yo female with PMH significant for biventricular HF EF 45%, hx of SSS s/p PPM, rate-controlled a-fib (not on anticoag due to hx of GI bleeding and fall risk), hx of R MCA stroke, CKD, and dementia who presents after an unresponsive episode witnessed by her daughter.  1. Syncopal episode: Patient presented with syncopal event after a witnessed fall and found to be orthostatic in the ED. She required a R eye eyebrow laceration repair. Head and neck CT negative (report below). Orthostatic hypotension thought to be secondary to poor PO intake vs diuretic therapy in the setting of RV dysfunction. She responded well to IV hydration and had negative orthostatics on day 3 of hospitalization, but continued to experience dizziness with prolonged  ambulation during PT sessions. She was discharged home with home health PT as recommended by PT. Unfortunately, she lives at home alone and has declined home health services in the past and although it seems like she has good and stable social support she is at high risk for readmission.     2. Atrial fibrillation with RVR: Patient has history of atrial fibrillation rate controlled with metoprolol. She is not on anticoagulation due to history of GI bleed and high risk for falls. Home metoprolol was held during admission due to orthostasis and patient developed Afib with  RVR with HR up to 115. She was initially evaluated for PE with V/Q scan in the setting of new tachycardia, limited mobility and reports of decreased oxygen saturations from RN and PT. It was later found out that monitor in patient's room was not working properly and she was in fact satting well on room air. Home metoprolol was started on day of discharge at a lower dose of 25 mg bid. This can be uptitrated at hospital follow up appointment as able.   3. UTI: Patient initially presented with asymptomatic bacteriuria with UA showing leukocytes and bacteria (no nitrites). She then developed increased urinary frequency and incontinence on day prior to discharge. She received one dose of ceftriaxone and was switched to Keflex 250 mg BID x 6 days (Bactrim and Macrobid contraindicated due to hx of CKD).   4. Biventricular HF: Home metoprolol and lasix held during admission due to orthostasis. Metoprolol started on day of discharge at a lower dose as explained above. She was not restarted on home Lasix or potassium. Remained euvolemic during admission with minimal changes in weight. Dry weight ~120lbs. Can consider resuming Lasix at a lower dose if BP stable as patient is preload dependent.    Discharge Vitals:   BP (!) 116/55 (BP Location: Right Arm)   Pulse 76   Temp 97.8 F (36.6 C) (Oral)   Resp (!) 21   Ht 5\' 2"  (1.575 m)   Wt 123 lb 7.3  oz (56 kg)   SpO2 96%   BMI 22.58 kg/m   Pertinent Labs, Studies, and Procedures:   CXR 9/15: FINDINGS: Stable cardiomegaly with aortic atherosclerosis. Mild central vascular congestion with layering small to moderate right pleural effusion. No acute displaced rib fracture. No acute osseous appearing.  Head and cervical spine CT: IMPRESSION: Head CT: No intracranial acute or traumatic finding. Old right MCA territory infarction. Right frontal scalp injury. Cervical spine CT: No acute or traumatic finding. Chronic degenerative spondylosis.  V/Q scan: FINDINGS: Ventilation: Clumping of the ventilation radiotracer about the left hilum. Suboptimal distribution but no definite ventilation defect.  Perfusion: Photopenic defect along the periphery of the left long perfusion activity related to left chest cardiac pacemaker. Homogeneous perfusion radiotracer activity elsewhere.  IMPRESSION: Low probability of acute pulmonary embolus.   Discharge Instructions: Discharge Instructions    (HEART FAILURE PATIENTS) Call MD:  Anytime you have any of the following symptoms: 1) 3 pound weight gain in 24 hours or 5 pounds in 1 week 2) shortness of breath, with or without a dry hacking cough 3) swelling in the hands, feet or stomach 4) if you have to sleep on extra pillows at night in order to breathe.    Complete by:  As directed    Call MD for:  difficulty breathing, headache or visual disturbances    Complete by:  As directed    Call MD for:  persistant dizziness or light-headedness    Complete by:  As directed    Diet - low sodium heart healthy    Complete by:  As directed    Increase activity slowly    Complete by:  As directed       Signed: [name], MD  Internal Medicine PGY-1  P [phone]

## 2017-01-02 NOTE — Progress Notes (Signed)
Subjective:  No acute events overnight. Patient was sleepy and not very interactive with team this morning, but stated she was doing well. Also reports having difficulty finding her words and catching her breath. Not orthostatic this AM. Likely discharge today pending PT evaluation.   Objective:  Vital signs in last 24 hours: Vitals:   01/02/17 0900 01/02/17 0930 01/02/17 1000 01/02/17 1013  BP: 117/74 109/79 118/65 (!) 112/52  Pulse: 87 88 67 89  Resp: 13 (!) 22 19 15   Temp:      TempSrc:      SpO2: (!) 82% (!) 71% 96% 100%   General: pleasant female, sleeping in bed, difficult to arouse initially, but able to wake up and minimally interact with team. She was however, alert and oriented when seen later in the day.  Cardiac: irregularly irregular rhythm, nl S1/S2, no murmurs, rubs or gallops  Pulm: CTAB, no wheezes or crackles, no increased work of breathing  Abd: soft, NTND, bowel sounds present  Neuro: A&Ox3, able to move all 4 extremities, no motor deficits noted, 5/5 strength in both UE and LE  Ext: warm and well perfused, no peripheral edema   Assessment/Plan:  [name] is an 83yo female with PMH significant for biventricular HF, hx of SSS s/p PPM, rate-controlled a-fib (not on anticoag due to hx of GI bleeding and fall risk), hx of R MCA stroke, CKD, and dementia who presents after an unresponsive episode witnessed by her daughter.  # Orthostatic hypotension leading to syncope: Likely 2/2 dehydration due to poor PO intake. Orthostatics today normal. Head CT negative and neuro exam nl today. No events recorded on telemetry.  - On telemetry - s/p 1L bolus  - Anticipate d/c today if patient able to work with PT   # Asymptomatic bacteriuria: UA cloudy with trace leuks and many bacteria. Patient denies urinary symptoms.  - Will not treat at this time given no symptoms   # Biventricular HF Afib, rate-controlled  SSS s/p PPM  Patient not currently on anticoagulation due  to hx of GI bleeding and fall risk; is on aspirin 81mg  daily. Last echo 07/2016 with LVEF 45-50%, diffuse hypokinesis, PA peak pressure 50mmHg, mild AR, mitral valve annular calcification with mild regurgitation, moderate tricuspid valve regurgitation. Interrogation of PPM shows last episode in May 2018. Patient in afib with HR in 70s-80s. On lasix at home, recently decreased dose due to overdiuresis. Dry weight ~118-120 lbs. Pt currently at dry weight.  - Cont home aspirin 81mg  daily - Hold home metoprolol 100mg  BID - Hold home lasix 40mg  daily due to orthostatic hypotension  - daily weights  # CKD: Cr 1.5. Baseline Cr ~1.5-1.6. - Continue to monitor  # Dementia: currently at baseline per daughter's report. Per prior clinic visits, She has some issues with memory. She gets help with medications at home, but still lives independently. Her daughter denies any concerning behavioral issues in the visit in July.  - Delirium precautions  # Anemia, chronic, normocytic Pt with normocytic anemia (Hgb 8.4) which has been steadily declining over the past several months, also low to borderline white cell and platelet counts. Normal iron profile not indicative ofIDA or ACD, likely . During August admission, further workup with myeloma panel was unrevealing and peripheral blood smear and normal free light chain ratio is reassuring. HgB is currently at baseline.   # HTN: stable, slightly on the lower side currently  -Holding lasix and metoprolol BID as above   Dispo: Anticipated discharge  today pending PT evaluation.   [name], MD  Internal Medicine PGY-1  P [phone]

## 2017-01-02 NOTE — ED Notes (Signed)
Pt repositioned and assisted with setting up breakfast tray.

## 2017-01-03 ENCOUNTER — Observation Stay (HOSPITAL_COMMUNITY): Payer: Medicare HMO

## 2017-01-03 DIAGNOSIS — K219 Gastro-esophageal reflux disease without esophagitis: Secondary | ICD-10-CM | POA: Diagnosis present

## 2017-01-03 DIAGNOSIS — N390 Urinary tract infection, site not specified: Secondary | ICD-10-CM | POA: Diagnosis present

## 2017-01-03 DIAGNOSIS — I130 Hypertensive heart and chronic kidney disease with heart failure and stage 1 through stage 4 chronic kidney disease, or unspecified chronic kidney disease: Secondary | ICD-10-CM | POA: Diagnosis present

## 2017-01-03 DIAGNOSIS — I951 Orthostatic hypotension: Secondary | ICD-10-CM | POA: Diagnosis present

## 2017-01-03 DIAGNOSIS — Z8719 Personal history of other diseases of the digestive system: Secondary | ICD-10-CM | POA: Diagnosis not present

## 2017-01-03 DIAGNOSIS — E860 Dehydration: Secondary | ICD-10-CM | POA: Diagnosis present

## 2017-01-03 DIAGNOSIS — E1122 Type 2 diabetes mellitus with diabetic chronic kidney disease: Secondary | ICD-10-CM | POA: Diagnosis present

## 2017-01-03 DIAGNOSIS — I2699 Other pulmonary embolism without acute cor pulmonale: Secondary | ICD-10-CM | POA: Diagnosis not present

## 2017-01-03 DIAGNOSIS — R0602 Shortness of breath: Secondary | ICD-10-CM | POA: Diagnosis not present

## 2017-01-03 DIAGNOSIS — T502X5A Adverse effect of carbonic-anhydrase inhibitors, benzothiadiazides and other diuretics, initial encounter: Secondary | ICD-10-CM | POA: Diagnosis present

## 2017-01-03 DIAGNOSIS — R000 Tachycardia, unspecified: Secondary | ICD-10-CM

## 2017-01-03 DIAGNOSIS — Z9181 History of falling: Secondary | ICD-10-CM | POA: Diagnosis not present

## 2017-01-03 DIAGNOSIS — I4891 Unspecified atrial fibrillation: Secondary | ICD-10-CM | POA: Diagnosis present

## 2017-01-03 DIAGNOSIS — S0181XA Laceration without foreign body of other part of head, initial encounter: Secondary | ICD-10-CM | POA: Diagnosis present

## 2017-01-03 DIAGNOSIS — Z8673 Personal history of transient ischemic attack (TIA), and cerebral infarction without residual deficits: Secondary | ICD-10-CM | POA: Diagnosis not present

## 2017-01-03 DIAGNOSIS — Z8679 Personal history of other diseases of the circulatory system: Secondary | ICD-10-CM | POA: Diagnosis not present

## 2017-01-03 DIAGNOSIS — D649 Anemia, unspecified: Secondary | ICD-10-CM | POA: Diagnosis present

## 2017-01-03 DIAGNOSIS — Z7982 Long term (current) use of aspirin: Secondary | ICD-10-CM | POA: Diagnosis not present

## 2017-01-03 DIAGNOSIS — I5042 Chronic combined systolic (congestive) and diastolic (congestive) heart failure: Secondary | ICD-10-CM | POA: Diagnosis present

## 2017-01-03 DIAGNOSIS — Z79899 Other long term (current) drug therapy: Secondary | ICD-10-CM | POA: Diagnosis not present

## 2017-01-03 DIAGNOSIS — G9340 Encephalopathy, unspecified: Secondary | ICD-10-CM | POA: Diagnosis present

## 2017-01-03 DIAGNOSIS — Z823 Family history of stroke: Secondary | ICD-10-CM | POA: Diagnosis not present

## 2017-01-03 DIAGNOSIS — Z8249 Family history of ischemic heart disease and other diseases of the circulatory system: Secondary | ICD-10-CM | POA: Diagnosis not present

## 2017-01-03 DIAGNOSIS — Z23 Encounter for immunization: Secondary | ICD-10-CM | POA: Diagnosis present

## 2017-01-03 DIAGNOSIS — W1830XA Fall on same level, unspecified, initial encounter: Secondary | ICD-10-CM | POA: Diagnosis present

## 2017-01-03 DIAGNOSIS — N189 Chronic kidney disease, unspecified: Secondary | ICD-10-CM | POA: Diagnosis not present

## 2017-01-03 DIAGNOSIS — Z950 Presence of cardiac pacemaker: Secondary | ICD-10-CM | POA: Diagnosis not present

## 2017-01-03 DIAGNOSIS — M810 Age-related osteoporosis without current pathological fracture: Secondary | ICD-10-CM | POA: Diagnosis present

## 2017-01-03 DIAGNOSIS — I5082 Biventricular heart failure: Secondary | ICD-10-CM | POA: Diagnosis present

## 2017-01-03 DIAGNOSIS — N183 Chronic kidney disease, stage 3 (moderate): Secondary | ICD-10-CM | POA: Diagnosis present

## 2017-01-03 DIAGNOSIS — R55 Syncope and collapse: Secondary | ICD-10-CM | POA: Diagnosis not present

## 2017-01-03 DIAGNOSIS — Z833 Family history of diabetes mellitus: Secondary | ICD-10-CM | POA: Diagnosis not present

## 2017-01-03 DIAGNOSIS — F0390 Unspecified dementia without behavioral disturbance: Secondary | ICD-10-CM | POA: Diagnosis present

## 2017-01-03 MED ORDER — TECHNETIUM TC 99M DIETHYLENETRIAME-PENTAACETIC ACID
32.0000 | Freq: Once | INTRAVENOUS | Status: AC | PRN
  Administered 2017-01-03: 32 via INTRAVENOUS

## 2017-01-03 MED ORDER — TECHNETIUM TO 99M ALBUMIN AGGREGATED
4.0000 | Freq: Once | INTRAVENOUS | Status: AC | PRN
  Administered 2017-01-03: 4 via INTRAVENOUS

## 2017-01-03 NOTE — Progress Notes (Signed)
Subjective: Patient became hypotensive and hypoxic while ambulating with PT yesterday. PT unable to get an accurate O2 sat or waveform at the time. This morning patient is complaining of L lateral chest pain and was noted to be tachycardic with heart rate in the 110-115. She was on 3L of O2 by [place], but no O2 sats seen on the monitor. Will recheck O2 sats during PT session today and V/Q scan ordered due to concern for PE. Denies dizziness.   Objective:  Vital signs in last 24 hours: Vitals:   01/02/17 1927 01/02/17 2300 01/03/17 0300 01/03/17 0359  BP:  109/72  (!) 113/59  Pulse:  68 (!) 108 (!) 104  Resp:  (!) 21  19  Temp: 97.7 F (36.5 C) 97.8 F (36.6 C)  97.7 F (36.5 C)  TempSrc: Axillary Oral  Oral  SpO2:  (!) 85% 100% 100%   General: pleasant female, sitting up in chair in no acute distress, able to speak in full sentences Cardiac: tachycardic with regular rhythm, nl S1/S2, no murmurs, rubs or gallops  Pulm: CTAB, no wheezes or crackles, no increased work of breathing  Ext: warm and well perfused, no peripheral edema    Assessment/Plan:  [name] is an 83yo female with PMH significant for biventricular HF, hx of SSS s/p PPM, rate-controlled a-fib (not on anticoag due to hx of GI bleeding and fall risk), hx of R MCA stroke, CKD, and dementia who presents after an unresponsive episode witnessed by her daughter.  # Tachycardia, new: Patient found to be tachycardic this morning with heart rate 110-115. She was on 3L O2, but we were unable to obtain O2 sats. Staff has been unable to obtain O2 sats as well despite multiple attempts at different sites. She complained of left-sided chest pain. She also reported difficulty catching her breath yesterday 9/17, but was not noted to be tachycardic at the time. No lower extremity swelling or pain noted. No previous history of DVT. She does have a history of A. Fib s/p PCCM for which she is not on anticoagulation for due to GI bleed and  high risk for falling. Geneva score 6 (age + tachycardia): moderate risk group. - V/Q scan ordered  - Obtain accurate O2 sats  # Orthostatic hypotension leading to syncope: Likely 2/2 dehydration due to poor PO intake. Normal orthostatics yesterday. Unable to complete PT session yesterday due to hypotension while ambulating and ?hypoxia (PT unable to get accurate O2 sats). - Will recheck O2 sats during PT session  - PT recommended home health PT - Wean O2 as tolerated  - On telemetry - s/p 1L NS bolus   # Asymptomatic bacteriuria: UA cloudy with trace leuks and many bacteria. Patient denies urinary symptoms.  - Will not treat at this time given no symptoms   # Biventricular HF Afib, rate-controlled  SSS s/p PPM  Patient not currently on anticoagulation due to hx of GI bleeding and fall risk; is on aspirin 81mg  daily. Last echo 07/2016 with LVEF 45-50%, diffuse hypokinesis, PA peak pressure 32mmHg, mild AR, mitral valve annular calcification with mild regurgitation, moderate tricuspid valve regurgitation. Interrogation of PPM shows last episode in May 2018. Patient in afib with HR in 70s-80s. On lasix at home, recently decreased dose due to overdiuresis. Dry weight ~118-120 lbs. Pt currently at dry weight.  - Cont home aspirin 81mg  daily - Hold home metoprolol 100mg  BID - Hold home lasix 40mg  daily due to orthostatic hypotension  - Daily weights  #  CKD: Cr 1.5. Baseline Cr ~1.5-1.6. - Continue to monitor  # Dementia: currently at baseline per daughter's report. Per prior clinic visits, She has some issues with memory. She gets help with medications at home, but still lives independently. Her daughter denies any concerning.  - Delirium precautions  # Anemia, chronic, normocyticPt with normocytic anemia (Hgb 8.4) which has been steadily declining over the past several months, also low to borderline WBC and platelet counts. Normal iron profile not indicative ofIDA or ACD, likely .  During [place], further workup with myeloma panel was unrevealing andperipheral blood smear and normal free light chain ratio is reassuring. HgB is currently at baseline.   # HTN: stable, slightly on the lower side currently  -Holding lasix and metoprolol BID as above   F: none E: monitor and replete as needed N: HH diet   VTE ppx: SCDs  Code status: Full code, not confirmed   Dispo: Anticipated discharge in approximately 1-2 day(s) pending improvement in cardiopulmonary status and further workup.   [name], MD  Internal Medicine PGY-1  P [phone]

## 2017-01-03 NOTE — Progress Notes (Signed)
Up to [facility] with one assist. Urine very malodorous. No c/o symptoms. Back in bed, HR staying tachy 1:1 Aflutter , had a little chest pressure after activity, cleared with rest. BP stable IM intern paged with assessment. Conversing appropriately. Drinking fluids and tolerating.

## 2017-01-03 NOTE — Progress Notes (Signed)
Physical Therapy Treatment Patient Details Name: [name] MRN: [id number] DOB: [birth year] Today's Date: 01/03/2017    History of Present Illness Pt admit with episode of orthostatic hypotension leading to syncope.    PT Comments    Pt with resting HR at 115 increasing into 120s with mobility. Unable to get a reading via pulse ox on SpO2 level. Pt with mild SOB. Pt with decreased insight to safety and impaired balance. PT unsafe to return home alone as pt is at increased falls risk and is unable to care for self safely. If 24/7 assist can not be provided pt will need SNF to achieve safe mod I level of function. Acute PT to con't to follow.   Follow Up Recommendations  Home health PT;Supervision/Assistance - 24 hour     Equipment Recommendations       Recommendations for Other Services       Precautions / Restrictions Precautions Precautions: Fall Precaution Comments: watch BP Restrictions Weight Bearing Restrictions: No    Mobility  Bed Mobility Overal bed mobility: Needs Assistance Bed Mobility: Supine to Sit;Sit to Supine     Supine to sit: Min guard Sit to supine: Min guard   General bed mobility comments: max directional v/c's, increased time, HOB elevated  Transfers Overall transfer level: Needs assistance Equipment used: Rolling walker (2 wheeled) Transfers: Sit to/from Stand Sit to Stand: Min guard         General transfer comment: verbal and tactile cues for hand placement  Ambulation/Gait Ambulation/Gait assistance: Min guard;Min assist Ambulation Distance (Feet): 40 Feet (with 1 seated rest break) Assistive device: Rolling walker (2 wheeled) Gait Pattern/deviations: Step-through pattern;Decreased stride length;Drifts right/left;Trunk flexed Gait velocity: slow Gait velocity interpretation: Below normal speed for age/gender General Gait Details: minA for walker management, pt wiht mild SOB however unable to get a read from pulse ox. Pt's BP  106/59. Pt needed to sit s/p 78feet due to "my knees feel weak"   Stairs            Wheelchair Mobility    Modified Rankin (Stroke Patients Only)       Balance Overall balance assessment: Needs assistance;History of Falls Sitting-balance support: No upper extremity supported;Feet supported Sitting balance-Leahy Scale: Fair     Standing balance support: Bilateral upper extremity supported;During functional activity Standing balance-Leahy Scale: Fair                              Cognition Arousal/Alertness: Lethargic Behavior During Therapy: Flat affect Overall Cognitive Status: History of cognitive impairments - at baseline                                 General Comments: pt sleepy with difficulty maintaining eyes opened      Exercises      General Comments        Pertinent Vitals/Pain Pain Assessment: No/denies pain    Home Living                      Prior Function            PT Goals (current goals can now be found in the care plan section) Progress towards PT goals: Progressing toward goals    Frequency    Min 3X/week      PT Plan Current plan remains appropriate    Co-evaluation  AM-PAC PT "6 Clicks" Daily Activity  Outcome Measure  Difficulty turning over in bed (including adjusting bedclothes, sheets and blankets)?: None Difficulty moving from lying on back to sitting on the side of the bed? : A Little Difficulty sitting down on and standing up from a chair with arms (e.g., wheelchair, bedside commode, etc,.)?: A Little Help needed moving to and from a bed to chair (including a wheelchair)?: A Little Help needed walking in hospital room?: A Little Help needed climbing 3-5 steps with a railing? : A Lot 6 Click Score: 18    End of Session Equipment Utilized During Treatment: Gait belt;Oxygen Activity Tolerance: Patient limited by fatigue Patient left: in bed;with call bell/phone  within reach;with bed alarm set Nurse Communication: Mobility status PT Visit Diagnosis: Unsteadiness on feet (R26.81);Muscle weakness (generalized) (M62.81)     Time: 0354-6568 PT Time Calculation (min) (ACUTE ONLY): 22 min  Charges:  $Gait Training: 8-22 mins                    G Codes:       Kittie Plater, PT, DPT Pager #: [phone] Office #: [phone]    [facility] 01/03/2017, 2:00 PM

## 2017-01-03 NOTE — Consult Note (Signed)
Care connection home based palliative care program provided by [facility]. Pt is down getting scan completed at this time no family in room.  Pt has been noncompliant with our visitations at home. She often refuses for RN to come out to evaluate the pt. When we do have an appointment she will not let RN in home once she arrives. On going care for her has been a challenge due to this. She does live at home alone. There is a grandson and his girlfriend who stays with the pt at times but not felt to be there consistently. We will continue to follow once discharged home but will discharge her if she continues to be non complaint with letting us visit to help or provide care for her. Webb Silversmith RN [phone]

## 2017-01-03 NOTE — Plan of Care (Signed)
Problem: Education: Goal: Knowledge of [place] Education information/materials will improve Outcome: Completed/Met Date Met: 01/03/17 Family and patient, patient is memory impaired at times  Problem: Physical Regulation: Goal: Ability to maintain clinical measurements within normal limits will improve Outcome: Progressing HR still elevated and changes frequently. Goal: Will remain free from infection Outcome: Progressing Urine very malodorous. MD's aware

## 2017-01-03 NOTE — Progress Notes (Signed)
  Date: 01/03/2017  Patient name: [name]  Medical record number: [id number]  Date of birth: 24-Aug-1925   I have seen and evaluated this patient and I have discussed the plan of care with the house staff. Please see their note for complete details. I concur with their findings with the following additions/corrections: Ms Thunder was seen on AM rounds. She was sitting in a recliner in no acute distress. She was able to speak in full sentences. Her lungs are clear to auscultation with good airflow. Dr. Frederico Hamman outlined our reasoning for PE evaluation in her note. Her VQ scan was low probability. It is not clear what was causing her tachycardia. We would like to get a good O2 saturation recorded target discharge. Notes indicate that she does not allow home services, like palliative care, into her home on a regular basis. Possible DC'd home later today. Discharge management was completed yesterday, the 17th, by Dr. Frederico Hamman. Discharge was delayed due to PT evaluation late in the day.  Bartholomew Crews, MD 01/03/2017, 2:35 PM

## 2017-01-04 ENCOUNTER — Telehealth: Payer: Self-pay

## 2017-01-04 DIAGNOSIS — Z23 Encounter for immunization: Secondary | ICD-10-CM | POA: Diagnosis not present

## 2017-01-04 DIAGNOSIS — B9689 Other specified bacterial agents as the cause of diseases classified elsewhere: Secondary | ICD-10-CM

## 2017-01-04 DIAGNOSIS — N390 Urinary tract infection, site not specified: Secondary | ICD-10-CM

## 2017-01-04 LAB — BASIC METABOLIC PANEL
Anion gap: 7 (ref 5–15)
BUN: 25 mg/dL — ABNORMAL HIGH (ref 6–20)
CO2: 26 mmol/L (ref 22–32)
Calcium: 9.2 mg/dL (ref 8.9–10.3)
Chloride: 108 mmol/L (ref 101–111)
Creatinine, Ser: 1.63 mg/dL — ABNORMAL HIGH (ref 0.44–1.00)
GFR calc non Af Amer: 27 mL/min — ABNORMAL LOW (ref >60)
GFR, EST AFRICAN AMERICAN: 31 mL/min — AB (ref >60)
Glucose, Bld: 188 mg/dL — ABNORMAL HIGH (ref 65–99)
POTASSIUM: 4.8 mmol/L (ref 3.5–5.1)
SODIUM: 141 mmol/L (ref 135–145)

## 2017-01-04 MED ORDER — METOPROLOL TARTRATE 25 MG PO TABS: 25.0000 mg | ORAL_TABLET | Freq: Two times a day (BID) | ORAL | 0 refills | Status: AC

## 2017-01-04 MED ORDER — CEPHALEXIN 250 MG PO CAPS: 250.0000 mg | ORAL_CAPSULE | Freq: Two times a day (BID) | ORAL | Status: DC

## 2017-01-04 MED ORDER — CEPHALEXIN 250 MG PO CAPS: 250.0000 mg | ORAL_CAPSULE | Freq: Two times a day (BID) | ORAL | 0 refills | Status: AC

## 2017-01-04 MED ORDER — DICLOFENAC SODIUM 1 % TD GEL: 4.0000 g | Freq: Four times a day (QID) | TRANSDERMAL | Status: DC

## 2017-01-04 MED ORDER — METOPROLOL TARTRATE 25 MG PO TABS
25.0000 mg | ORAL_TABLET | Freq: Two times a day (BID) | ORAL | Status: DC
  Administered 2017-01-04: 25 mg via ORAL
  Filled 2017-01-04: qty 1

## 2017-01-04 MED ORDER — DEXTROSE 5 % IV SOLN
1.0000 g | Freq: Every day | INTRAVENOUS | Status: DC
  Administered 2017-01-04: 1 g via INTRAVENOUS
  Filled 2017-01-04 (×2): qty 10

## 2017-01-04 MED ORDER — DICLOFENAC SODIUM 1 % TD GEL
4.0000 g | Freq: Four times a day (QID) | TRANSDERMAL | Status: DC
  Administered 2017-01-04: 11:00:00 4 g via TOPICAL
  Filled 2017-01-04: qty 100

## 2017-01-04 NOTE — Progress Notes (Signed)
Up to [facility] to urinate with one assist. Patient states does hurt when she goes. Urine sent for culture per order. HR still 120. SOB on exertion. O2 on at 2L for comfort.

## 2017-01-04 NOTE — Telephone Encounter (Signed)
Hospital TOC per Dr Isac Sarna, discharge 01/04/2017, appt 01/11/2017.

## 2017-01-04 NOTE — Care Management Note (Addendum)
Case Management Note  Patient Details  Name: [name] MRN: [id number] Date of Birth: [birth year]  Subjective/Objective:  Tachycardia, orthostatic hypotension leading to syncope, HF, CKD, afib                  Action/Plan: Discharge Planning:  NCM spoke to pt and gave permission to speak to dtr, Adrian Blackwater # [phone]. Pt states she lives at home alone, and wants to continue to stay in her home. Explained to pt that she will need to consider alternate plan post dc maybe staying in home with children, ALF or SNF rehab. Pt is active with [facility]. Pt states sometimes she does not have strength to go the door to let [facility] personnel in the home. She has Conservation officer, nature with seat, RW and bedside commode at home. Pt requesting a tub bench. Contacted dtr and states they have discussed pt moving in with her in near future. Contacted [facility] to make aware pt may dc home today. Contacted [facility] for tub bench to be delivered to room prior to dc.   Received call from [facility] and pt will have to pay out of pocket for tub bench. NCM made dtr aware of out of pocket cost of $60.   PCP Gilles Chiquito B MD    Expected Discharge Date:                Expected Discharge Plan:  [place]  In-House Referral:  NA  Discharge planning Services  CM Consult  Post Acute Care Choice:  Home Health Choice offered to:  Patient, Adult Children  DME Arranged:  Tub bench DME Agency:  [place] Arranged:  RN, PT, OT [facility] Agency:  [facility]  Status of Service:    If discussed at [place] of Stay Meetings, dates discussed:    Additional Comments:  Erenest Rasher, RN 01/04/2017, 11:45 AM

## 2017-01-04 NOTE — Progress Notes (Signed)
  Date: 01/04/2017  Patient name: [name]  Medical record number: [id number]  Date of birth: 12-11-1925   I have seen and evaluated this patient and I have discussed the plan of care with the house staff. Please see their note for complete details. I concur with their findings with the following additions/corrections:   1. Tachycardia - yesterday, for pretest probability is high so we obtained a VQ scan which was low probability. We now have a better etiology for her tachycardia.  2. A Fib with RVR - her beta blocker was held on admission due to orthostatic hypotension. Lack of beta blockade is likely the cause of her tachycardia. Her metoprolol will be provided back but at a lower dose so that we do not induce hypotension especially once her Lasix is resumed.  3. Symptomatic orthostatic hypotension, now resolved, in the setting of combined heart failure with an EF of 50% but severely dilated and reduced right ventricular systolic function - her volume status is going to be tricky as she is preload dependent. There will be a very fine line between excessive diuresis resulting in hypotension and under diuresis resulting in volume overload. Her weight has been fluctuating from 112-134. She was 112 pounds with discharged in July but was then readmitted in August for dehydration and was discharged with a weight of 116. Her current weight is 123. She has no pulmonary crackles, is not hypoxic, does not have tachypnea, and does not complain of dyspnea. Therefore, we will continue to hold her Lasix since we are resuming her beta blocker but at a lower dose. She will be seen in the outpatient clinic following discharge and if needed, her Lasix can be added back at that time.  Overall prognosis is poor and likelihood of readmission is high due to underlying dementia, residing alone, turning away home services, and tenuous fluid status.  Bartholomew Crews, MD 01/04/2017, 2:18 PM

## 2017-01-04 NOTE — Progress Notes (Signed)
Subjective: Patient complained of abdominal pain,urinary frequency and incontinence overnight and was started on IV ceftriaxone for UTI. This morning she continues to complain of urinary frequency. Denied abdominal pain. She also reported new right sided chest pain and intermittent shortness of breath. Daughter present. Discussed plan with her and answered all questions.  Objective:  Vital signs in last 24 hours: Vitals:   01/04/17 0754 01/04/17 1000 01/04/17 1107 01/04/17 1121  BP:  132/60  (!) 141/87  Pulse:      Resp:  17  16  Temp:    (!) 97.3 F (36.3 C)  TempSrc:    Axillary  SpO2: 98% 98% 97%   Weight:      Height:       General: pleasant female,lying in bed in no acute distress, able to speak in full sentences Cardiac: tachycardic with irregularly irregular rhythm, nl S1/S2, no murmurs, rubs or gallops, no JVD noted  Pulm: CTAB, no wheezes or crackles, no increased work of breathing  Ext: warm and well perfused, no peripheral edema  Derm: multiple excoriations noted R-sided lateral chest, no adenopathy noted   Assessment/Plan:  Ms. Araiza is an 83yo female with PMH significant for biventricular HF, hx of SSS s/p PPM, rate-controlled a-fib (not on anticoag due to hx of GI bleeding and fall risk), hx of R MCA stroke, CKD, and dementia who presents after an unresponsive episode witnessed by her daughter.  # Tachycardia, new: PE ruled out with low probability V/Q scan. She continues to be tachycardic with heart rate in the 110s. Continues to complain of shortness of breath but not hypoxic while on room air. Tachycardia is likely secondary to A. Fib with RVR since patient has not been on home metoprolol since admission secondary to orthostasis. - Resume metoprolol 25 mg BID with plan to up titrate at follow-up visit - Will plan to resume home Lasix at follow-up visit as well  # Orthostatic hypotension leading to syncope: Resolved. Likely 2/2 dehydration due to poor PO  intake.  - PT recommended home health PT - Wean O2 as tolerated  - On telemetry  # Asymptomatic bacteriuria: UA cloudy with trace leuks and many bacteria. Patient when you complaint of urinary frequency and incontinence. Ceftriaxone started overnight. - Switch to Keflex 6 days - Will follow-up with urine culture and the changes to antibiotic therapy if needed  # Biventricular HF Afib, rate-controlled  SSS s/p PPM  Patient not currently on anticoagulation due to hx of GI bleeding and fall risk; is on aspirin 81mg  daily. Last echo 07/2016 with LVEF 45-50%, diffuse hypokinesis, PA peak pressure 37mmHg, mild AR, mitral valve annular calcification with mild regurgitation, moderate tricuspid valve regurgitation. Interrogation of PPM shows last episode in May 2018. Patient in afib with HR in 70s-80s. On lasix at home, recently decreased dose due to overdiuresis. Dry weight ~118-120 lbs.  - Cont home aspirin 81mg  daily - Resume metoprolol 25 mg BID (home dose 100 mg BI - Hold home lasix 40mg  daily. May restart at hospital follow up  - Daily weights  # CKD: Cr 1.15. Baseline Cr ~1.5-1.6 - Continue to monitor  # Dementia: currently at baseline per daughter's report. Per prior clinic visits, She has some issues with memory. She gets help with medications at home, but still lives independently. Her daughter denies any concerning.  - Delirium precautions  # Anemia, chronic, normocytic:Hgb at baseline.   # HTN: stable, slightly on the lower side currently  - Resume metoprolol 25  mg BID (home dose 100 mg BID). - Continue to hold Lasix. Can restart at hospital follow up if BP stable   F: none E: monitor and replete as needed N: soft diet   VTE ppx: SCDs  Code status: Full code, not confirmed   Dispo: Anticipated discharge in approximately today.  [name], MD  Internal Medicine PGY-1  P [phone]

## 2017-01-04 NOTE — Progress Notes (Signed)
Pt discharged to home with daughter, all discharge instructions given to pts daughter with whom the patient is going to stay with until she feels better, prescriptions for abx and lopressor given to pts daughter as well. All VSS at time of d/c.

## 2017-01-04 NOTE — Care Management Important Message (Addendum)
Important Message  Patient Details  Name: [name] MRN: [id number] Date of Birth: [birth year]   Medicare Important Message Given:  Yes  Reviewed IM with dtr, Adrian Blackwater via phone, # [phone].  Erenest Rasher, RN 01/04/2017, 11:22 AM

## 2017-01-04 NOTE — Discharge Instructions (Addendum)
You were admitted to [facility] because of dizziness and low blood pressure.  Continue to work with physical therapy at home. This will help with your strength and dizziness.  We held some of your heart medications while you were admitted as these can lower your blood pressure. We restarted your home metoprolol at a lower dose. Please take metoprolol 25 MG one tablet 2 times a day. This can be changed at your hospital follow-up appointment if needed.  Please stop taking your home Lasix and potassium pills until you are seen on 9/26 for your hospital follow-up appointment at the internal medicine clinic. We can restarted then if able.   You were also treated for a urinary tract infection. Please continue to take Keflex 1 tablet 2 times a day for 6 days.

## 2017-01-05 ENCOUNTER — Encounter (HOSPITAL_COMMUNITY): Payer: Self-pay | Admitting: Cardiology

## 2017-01-05 ENCOUNTER — Inpatient Hospital Stay (HOSPITAL_COMMUNITY)
Admission: EM | Admit: 2017-01-05 | Discharge: 2017-01-16 | DRG: 291 | Disposition: E | Payer: Medicare HMO | Attending: Internal Medicine | Admitting: Internal Medicine

## 2017-01-05 ENCOUNTER — Emergency Department (HOSPITAL_COMMUNITY): Payer: Medicare HMO

## 2017-01-05 ENCOUNTER — Observation Stay (HOSPITAL_COMMUNITY): Payer: Medicare HMO

## 2017-01-05 DIAGNOSIS — R05 Cough: Secondary | ICD-10-CM | POA: Diagnosis not present

## 2017-01-05 DIAGNOSIS — Z515 Encounter for palliative care: Secondary | ICD-10-CM | POA: Diagnosis not present

## 2017-01-05 DIAGNOSIS — I482 Chronic atrial fibrillation: Secondary | ICD-10-CM | POA: Diagnosis not present

## 2017-01-05 DIAGNOSIS — I4891 Unspecified atrial fibrillation: Secondary | ICD-10-CM

## 2017-01-05 DIAGNOSIS — R0600 Dyspnea, unspecified: Secondary | ICD-10-CM

## 2017-01-05 DIAGNOSIS — R61 Generalized hyperhidrosis: Secondary | ICD-10-CM | POA: Diagnosis not present

## 2017-01-05 DIAGNOSIS — E1122 Type 2 diabetes mellitus with diabetic chronic kidney disease: Secondary | ICD-10-CM | POA: Diagnosis present

## 2017-01-05 DIAGNOSIS — R0602 Shortness of breath: Secondary | ICD-10-CM | POA: Diagnosis not present

## 2017-01-05 DIAGNOSIS — R32 Unspecified urinary incontinence: Secondary | ICD-10-CM | POA: Diagnosis not present

## 2017-01-05 DIAGNOSIS — N189 Chronic kidney disease, unspecified: Secondary | ICD-10-CM | POA: Diagnosis not present

## 2017-01-05 DIAGNOSIS — R000 Tachycardia, unspecified: Secondary | ICD-10-CM | POA: Diagnosis not present

## 2017-01-05 DIAGNOSIS — I82622 Acute embolism and thrombosis of deep veins of left upper extremity: Secondary | ICD-10-CM | POA: Diagnosis not present

## 2017-01-05 DIAGNOSIS — Z8249 Family history of ischemic heart disease and other diseases of the circulatory system: Secondary | ICD-10-CM

## 2017-01-05 DIAGNOSIS — Z7982 Long term (current) use of aspirin: Secondary | ICD-10-CM

## 2017-01-05 DIAGNOSIS — N39490 Overflow incontinence: Secondary | ICD-10-CM | POA: Diagnosis not present

## 2017-01-05 DIAGNOSIS — I270 Primary pulmonary hypertension: Secondary | ICD-10-CM | POA: Diagnosis not present

## 2017-01-05 DIAGNOSIS — Z8719 Personal history of other diseases of the digestive system: Secondary | ICD-10-CM

## 2017-01-05 DIAGNOSIS — R55 Syncope and collapse: Secondary | ICD-10-CM | POA: Diagnosis not present

## 2017-01-05 DIAGNOSIS — Z45018 Encounter for adjustment and management of other part of cardiac pacemaker: Secondary | ICD-10-CM

## 2017-01-05 DIAGNOSIS — Z8679 Personal history of other diseases of the circulatory system: Secondary | ICD-10-CM

## 2017-01-05 DIAGNOSIS — Z823 Family history of stroke: Secondary | ICD-10-CM | POA: Diagnosis not present

## 2017-01-05 DIAGNOSIS — Z950 Presence of cardiac pacemaker: Secondary | ICD-10-CM | POA: Diagnosis not present

## 2017-01-05 DIAGNOSIS — J90 Pleural effusion, not elsewhere classified: Secondary | ICD-10-CM | POA: Diagnosis not present

## 2017-01-05 DIAGNOSIS — I5042 Chronic combined systolic (congestive) and diastolic (congestive) heart failure: Secondary | ICD-10-CM

## 2017-01-05 DIAGNOSIS — I5043 Acute on chronic combined systolic (congestive) and diastolic (congestive) heart failure: Secondary | ICD-10-CM | POA: Diagnosis not present

## 2017-01-05 DIAGNOSIS — Z66 Do not resuscitate: Secondary | ICD-10-CM | POA: Diagnosis not present

## 2017-01-05 DIAGNOSIS — M1990 Unspecified osteoarthritis, unspecified site: Secondary | ICD-10-CM | POA: Diagnosis not present

## 2017-01-05 DIAGNOSIS — I69992 Facial weakness following unspecified cerebrovascular disease: Secondary | ICD-10-CM | POA: Diagnosis not present

## 2017-01-05 DIAGNOSIS — I69951 Hemiplegia and hemiparesis following unspecified cerebrovascular disease affecting right dominant side: Secondary | ICD-10-CM | POA: Diagnosis not present

## 2017-01-05 DIAGNOSIS — K219 Gastro-esophageal reflux disease without esophagitis: Secondary | ICD-10-CM | POA: Diagnosis present

## 2017-01-05 DIAGNOSIS — H1589 Other disorders of sclera: Secondary | ICD-10-CM

## 2017-01-05 DIAGNOSIS — I351 Nonrheumatic aortic (valve) insufficiency: Secondary | ICD-10-CM | POA: Diagnosis not present

## 2017-01-05 DIAGNOSIS — I5021 Acute systolic (congestive) heart failure: Secondary | ICD-10-CM | POA: Diagnosis not present

## 2017-01-05 DIAGNOSIS — N393 Stress incontinence (female) (male): Secondary | ICD-10-CM | POA: Diagnosis present

## 2017-01-05 DIAGNOSIS — N183 Chronic kidney disease, stage 3 unspecified: Secondary | ICD-10-CM | POA: Diagnosis present

## 2017-01-05 DIAGNOSIS — I878 Other specified disorders of veins: Secondary | ICD-10-CM | POA: Diagnosis not present

## 2017-01-05 DIAGNOSIS — K5730 Diverticulosis of large intestine without perforation or abscess without bleeding: Secondary | ICD-10-CM | POA: Diagnosis present

## 2017-01-05 DIAGNOSIS — Z9181 History of falling: Secondary | ICD-10-CM | POA: Diagnosis not present

## 2017-01-05 DIAGNOSIS — E860 Dehydration: Secondary | ICD-10-CM | POA: Diagnosis present

## 2017-01-05 DIAGNOSIS — I5023 Acute on chronic systolic (congestive) heart failure: Secondary | ICD-10-CM | POA: Diagnosis not present

## 2017-01-05 DIAGNOSIS — M7989 Other specified soft tissue disorders: Secondary | ICD-10-CM | POA: Diagnosis not present

## 2017-01-05 DIAGNOSIS — E875 Hyperkalemia: Secondary | ICD-10-CM | POA: Diagnosis present

## 2017-01-05 DIAGNOSIS — Z87440 Personal history of urinary (tract) infections: Secondary | ICD-10-CM

## 2017-01-05 DIAGNOSIS — I951 Orthostatic hypotension: Secondary | ICD-10-CM | POA: Diagnosis present

## 2017-01-05 DIAGNOSIS — Z8673 Personal history of transient ischemic attack (TIA), and cerebral infarction without residual deficits: Secondary | ICD-10-CM | POA: Diagnosis not present

## 2017-01-05 DIAGNOSIS — Z9981 Dependence on supplemental oxygen: Secondary | ICD-10-CM | POA: Diagnosis not present

## 2017-01-05 DIAGNOSIS — F0390 Unspecified dementia without behavioral disturbance: Secondary | ICD-10-CM | POA: Diagnosis not present

## 2017-01-05 DIAGNOSIS — Z833 Family history of diabetes mellitus: Secondary | ICD-10-CM | POA: Diagnosis not present

## 2017-01-05 DIAGNOSIS — M810 Age-related osteoporosis without current pathological fracture: Secondary | ICD-10-CM | POA: Diagnosis present

## 2017-01-05 DIAGNOSIS — I110 Hypertensive heart disease with heart failure: Secondary | ICD-10-CM

## 2017-01-05 DIAGNOSIS — I130 Hypertensive heart and chronic kidney disease with heart failure and stage 1 through stage 4 chronic kidney disease, or unspecified chronic kidney disease: Principal | ICD-10-CM | POA: Diagnosis present

## 2017-01-05 DIAGNOSIS — I081 Rheumatic disorders of both mitral and tricuspid valves: Secondary | ICD-10-CM | POA: Diagnosis present

## 2017-01-05 DIAGNOSIS — I5082 Biventricular heart failure: Secondary | ICD-10-CM | POA: Diagnosis present

## 2017-01-05 DIAGNOSIS — G9340 Encephalopathy, unspecified: Secondary | ICD-10-CM | POA: Diagnosis not present

## 2017-01-05 DIAGNOSIS — I272 Pulmonary hypertension, unspecified: Secondary | ICD-10-CM | POA: Diagnosis present

## 2017-01-05 DIAGNOSIS — R0902 Hypoxemia: Secondary | ICD-10-CM | POA: Diagnosis not present

## 2017-01-05 LAB — BASIC METABOLIC PANEL
ANION GAP: 6 (ref 5–15)
BUN: 26 mg/dL — ABNORMAL HIGH (ref 6–20)
CALCIUM: 9.4 mg/dL (ref 8.9–10.3)
CO2: 27 mmol/L (ref 22–32)
Chloride: 108 mmol/L (ref 101–111)
Creatinine, Ser: 1.4 mg/dL — ABNORMAL HIGH (ref 0.44–1.00)
GFR, EST AFRICAN AMERICAN: 37 mL/min — AB (ref >60)
GFR, EST NON AFRICAN AMERICAN: 32 mL/min — AB (ref >60)
Glucose, Bld: 187 mg/dL — ABNORMAL HIGH (ref 65–99)
POTASSIUM: 5.1 mmol/L (ref 3.5–5.1)
Sodium: 141 mmol/L (ref 135–145)

## 2017-01-05 LAB — I-STAT TROPONIN, ED: Troponin i, poc: 0.02 ng/mL (ref 0.00–0.08)

## 2017-01-05 LAB — CBC WITH DIFFERENTIAL/PLATELET
BASOS ABS: 0 10*3/uL (ref 0.0–0.1)
BASOS PCT: 0 %
Eosinophils Absolute: 0 10*3/uL (ref 0.0–0.7)
Eosinophils Relative: 1 %
HEMATOCRIT: 27 % — AB (ref 36.0–46.0)
Hemoglobin: 8.6 g/dL — ABNORMAL LOW (ref 12.0–15.0)
Lymphocytes Relative: 12 %
Lymphs Abs: 0.6 10*3/uL — ABNORMAL LOW (ref 0.7–4.0)
MCH: 28.2 pg (ref 26.0–34.0)
MCHC: 31.9 g/dL (ref 30.0–36.0)
MCV: 88.5 fL (ref 78.0–100.0)
MONO ABS: 0.7 10*3/uL (ref 0.1–1.0)
Monocytes Relative: 15 %
NEUTROS ABS: 3.3 10*3/uL (ref 1.7–7.7)
Neutrophils Relative %: 72 %
PLATELETS: 129 10*3/uL — AB (ref 150–400)
RBC: 3.05 MIL/uL — AB (ref 3.87–5.11)
RDW: 17.7 % — AB (ref 11.5–15.5)
WBC: 4.6 10*3/uL (ref 4.0–10.5)

## 2017-01-05 LAB — ECHOCARDIOGRAM COMPLETE
Height: 62 in
Weight: 1968 oz

## 2017-01-05 LAB — BRAIN NATRIURETIC PEPTIDE: B NATRIURETIC PEPTIDE 5: 3116.3 pg/mL — AB (ref 0.0–100.0)

## 2017-01-05 LAB — URINE CULTURE

## 2017-01-05 LAB — PREALBUMIN: PREALBUMIN: 6 mg/dL — AB (ref 18–38)

## 2017-01-05 MED ORDER — CEPHALEXIN 250 MG PO CAPS
250.0000 mg | ORAL_CAPSULE | Freq: Two times a day (BID) | ORAL | Status: DC
  Filled 2017-01-05: qty 1

## 2017-01-05 MED ORDER — METOPROLOL TARTRATE 25 MG PO TABS
25.0000 mg | ORAL_TABLET | Freq: Two times a day (BID) | ORAL | Status: DC
  Administered 2017-01-05 – 2017-01-06 (×3): 25 mg via ORAL
  Filled 2017-01-05 (×6): qty 1

## 2017-01-05 MED ORDER — MUPIROCIN 2 % EX OINT
1.0000 "application " | TOPICAL_OINTMENT | Freq: Two times a day (BID) | CUTANEOUS | Status: DC
  Administered 2017-01-05 – 2017-01-09 (×7): 1 via NASAL
  Filled 2017-01-05: qty 22

## 2017-01-05 MED ORDER — ENSURE ENLIVE PO LIQD
237.0000 mL | Freq: Two times a day (BID) | ORAL | Status: DC
  Administered 2017-01-05: 237 mL via ORAL
  Filled 2017-01-05: qty 237

## 2017-01-05 MED ORDER — CHLORHEXIDINE GLUCONATE CLOTH 2 % EX PADS
6.0000 | MEDICATED_PAD | Freq: Every day | CUTANEOUS | Status: DC
  Administered 2017-01-06 – 2017-01-07 (×2): 6 via TOPICAL

## 2017-01-05 MED ORDER — CEPHALEXIN 250 MG PO CAPS: 250.0000 mg | ORAL_CAPSULE | Freq: Two times a day (BID) | ORAL | Status: DC

## 2017-01-05 MED ORDER — FUROSEMIDE 10 MG/ML IJ SOLN
20.0000 mg | Freq: Once | INTRAMUSCULAR | Status: AC
  Administered 2017-01-05: 20 mg via INTRAVENOUS
  Filled 2017-01-05: qty 2

## 2017-01-05 MED ORDER — CEFAZOLIN SODIUM-DEXTROSE 2-4 GM/100ML-% IV SOLN
2.0000 g | Freq: Two times a day (BID) | INTRAVENOUS | Status: DC
  Administered 2017-01-05: 2 g via INTRAVENOUS
  Filled 2017-01-05: qty 100

## 2017-01-05 NOTE — ED Notes (Signed)
Message sent to pharmacy requesting metoprolol be verified and send to pod B.

## 2017-01-05 NOTE — ED Triage Notes (Signed)
Pt was DC'd from here yesterday with CHF was supposed be set up with home O2 but it was never arranged. Tonight when the pt tried to get up go to the restroom she became short of breath and family called EMS. Pt was placed on 2LTRS and reports feeling much better.

## 2017-01-05 NOTE — Consult Note (Signed)
Cardiology Consultation:   Patient ID: [name]; [id number]; October 13, 1925   Admit date: 12/25/2016 Date of Consult: 01/12/2017  Primary Care Provider: Sid Falcon, MD Primary Cardiologist: Dr Lovena Le Primary Electrophysiologist:  Dr Lovena Le   Patient Profile:   [name] is a 83 y.o. female with a hx of CAF, CHF, and pacemaker who is being seen today for the evaluation of syncope at the request of Dr Lynnae January.  History of Present Illness:   [name] is an 83 y/o female with dementia. She still lives at home alone with family help. She has a history of CAF with SSS. She had a pacemaker in 2000 with a generator change in 2014 (MDT). She is not oin anticoagulation secondary to falls and a history of GI bleeding. She has bi ventricular combined CHF. Her last echo was in April 2018. Her EF was 45-50% with PA pressure of 50 mmHg and severe RVD. Her LOV with Dr Lovena Le was in Feb 2018.   She is followed closely by the internal medicine service. It appears she was doing pretty well till July this year. She was admitted with CHF 7/19-7/22. Her wgt went from 133-112. Lasix was increased to 60/40. She then presented 8/1-8/4 with orthostatic weakness and acute on chronic renal insufficiency despite. Despite an admission wgt of 121 lbs it was felt she was dehydrated and her Lasix was cut back to 40 mg.  She then presented 9/16 with syncope and noted to be orthostatic. Her beta blocker and Lasix were stopped. She was seen in there office 9/20 and noted to be tachycardic and her beta blocker was resumed at a lower dose, diuretic still on hold. VQ low risk. CXR showed CHF.   Last PM she was helped to the BR and on return she complained of SOB and "went out" per daughter. She is seen now in the ED. Pt is flat in bed, not cooperative with exam. BNP noted to be 3116.    Past Medical History:  Diagnosis Date  . Adenomatous colon polyp    tubular  . Allergic rhinitis   . Anemia    Iron  deficiency, does not tolerate po  . Arthritis    "all over my body"  . Atrial fibrillation ([place])    no anticoagulation secondary to GI bld and falls  . CHF (congestive heart failure) (HCC)    diastolic dysfunction-EF 83%  . Constipation   . Dementia   . Diverticulosis of colon (without mention of hemorrhage)   . DJD (degenerative joint disease)   . GERD (gastroesophageal reflux disease)   . History of trichomonal urethritis   . History of vaginal bleeding 05/2005   nagative endometrial biopsy  . Hypertension    well controlled on 3 agents  . Incontinence overflow, stress female    on vesicare  . Onychomycosis   . Osteoporosis   . Pacemaker    MDT 2000, and 2014  . Restrictive lung disease 1996   PFT's showed mild disease  . Stroke [facility]) 2002   R MCA, cardioembolic  . Type II diabetes mellitus (HCC)    diet controlled  . Weight loss, unintentional 2010-2011   8/08-8/10 138-146 lbs, between 8/10-10/11 lost 18 lbs., work-up negative    Past Surgical History:  Procedure Laterality Date  . ABDOMINAL HYSTERECTOMY    . CATARACT EXTRACTION    . CHOLECYSTECTOMY    . COLONOSCOPY    . ESOPHAGOGASTRODUODENOSCOPY    . INSERT / REPLACE /  REMOVE PACEMAKER  2000   MDT Sigma pacemaker implanted by Dr Lovena Le;  Marland Kitchen PACEMAKER GENERATOR CHANGE N/A 11/02/2012   Procedure: PACEMAKER GENERATOR CHANGE;  Surgeon: Evans Lance, MD;  Location: [facility] CATH LAB;  Service: Cardiovascular;  Laterality: N/A;     Home Medications:  Prior to Admission medications   Medication Sig Start Date End Date Taking? Authorizing Provider  aspirin EC 81 MG tablet Take 81 mg by mouth daily.   Yes [provider]  cephALEXin (KEFLEX) 250 MG capsule Take 1 capsule (250 mg total) by mouth 2 (two) times daily. Until 9/24 01/04/17  Yes Santos-Sanchez, Merlene Morse, MD  diclofenac sodium (VOLTAREN) 1 % GEL Apply 2 g topically 4 (four) times daily.   Yes [provider]  docusate sodium (COLACE) 100 MG capsule  Take 1 capsule (100 mg total) by mouth daily as needed. Patient taking differently: Take 100 mg by mouth daily as needed for mild constipation.  06/20/16 06/20/17 Yes Norman Herrlich, MD  metoprolol tartrate (LOPRESSOR) 25 MG tablet Take 1 tablet (25 mg total) by mouth 2 (two) times daily. 01/04/17  Yes Santos-Sanchez, Merlene Morse, MD  Nutritional Supplements (Sylvarena) LIQD Take 237 mLs by mouth 3 (three) times daily. 12/19/14  Yes Zada Finders, MD  pantoprazole (PROTONIX) 40 MG tablet Take 1 tablet (40 mg total) by mouth daily. 08/10/16  Yes Rivet, Sindy Guadeloupe, MD  polyethylene glycol (MIRALAX / GLYCOLAX) packet Take 17 g by mouth daily as needed for mild constipation. 03/20/16  Yes Maryellen Pile, MD  loratadine (CLARITIN) 5 MG chewable tablet Chew 1 tablet (5 mg total) by mouth daily. Patient not taking: Reported on 12/31/2016 08/10/16   RivetSindy Guadeloupe, MD    Inpatient Medications: Scheduled Meds: . cephALEXin  250 mg Oral BID  . metoprolol tartrate  25 mg Oral BID   Continuous Infusions:  PRN Meds:   Allergies:   No Known Allergies  Social History:   Social History   Social History  . Marital status: Widowed    Spouse name: N/A  . Number of children: 8  . Years of education: N/A   Occupational History  . retired Retired   Social History Main Topics  . Smoking status: Never Smoker  . Smokeless tobacco: Never Used  . Alcohol use 1.2 oz/week    2 Cans of beer per week  . Drug use: No  . Sexual activity: No   Other Topics Concern  . Not on file   Social History Narrative   [employer]   [name] home# [phone]   Cell# [phone]      Patient lives alone, but has 7, of 8, living children who check in on her.   She is widowed.    Family History:    Family History  Problem Relation Age of Onset  . Diabetes Sister   . Stroke Sister   . Heart disease Unknown   . Heart disease Father      ROS:  Please see the history of present illness.  ROS  All other  ROS reviewed and negative.     Physical Exam/Data:   Vitals:   12/31/2016 0945 01/13/2017 1000 01/04/2017 1015 12/31/2016 1030  BP: 124/76 124/74 122/79 108/68  Pulse: 97 94 99 (!) 101  Resp: '15 18 18 '$ (!) 21  Temp:      SpO2: 100% 93% 100% 100%  Weight:      Height:       No intake or output data  in the 24 hours ending 01/11/2017 1111 Filed Weights   01/13/2017 0310  Weight: 123 lb (55.8 kg)   Body mass index is 22.5 kg/m.  General:  Elderly, frail, cachectic AA female, NAD HEENT: normal Lymph: no adenopathy Neck: JVD noted Endocrine:  No thryomegaly Vascular: No carotid bruits;  Cardiac:  Irregularly irregular rhythm with 2/6 systolic murmur LSB Lungs:  decreased breath sounds, poor effort Abd: soft, nontender, no hepatomegaly  Ext: no edema Musculoskeletal:  No deformities, BUE and BLE strength normal and equal Skin: warm and dry  Neuro:  CNs 2-12 intact, no focal abnormalities noted, she was able to squeeze my hand and move her feet to command Psych:  Lethargic, irritable  EKG:  The EKG was personally reviewed and demonstrates:  AF with CVR Telemetry:  Telemetry was personally reviewed and demonstrates:  AF occasional paced beats  Relevant CV Studies:  Echo April 2018- Study Conclusions  - Left ventricle: The cavity size was normal. Systolic function was   mildly reduced. The estimated ejection fraction was in the range   of 45% to 50%. Diffuse hypokinesis. - Aortic valve: There was mild regurgitation. - Mitral valve: Calcified annulus. Moderately thickened, moderately   calcified leaflets . There was mild regurgitation. - Left atrium: The atrium was moderately dilated. - Right ventricle: The cavity size was severely dilated. Wall   thickness was normal. Systolic function was severely reduced. - Right atrium: The atrium was severely dilated. Pacer wire or   catheter noted in right atrium. - Tricuspid valve: There was moderate regurgitation. - Pulmonary arteries:  Systolic pressure was moderately increased.   PA peak pressure: 51 mm Hg (S). - Inferior vena cava: The vessel was dilated. The respirophasic   diameter changes were blunted (< 50%), consistent with elevated   central venous pressure.  Impressions:  - There is moderate pericardial effusion around the inferolateral   LV wall and RV free wall. No signs of tamponade.   No significant change since the prior study.  Laboratory Data:  Chemistry Recent Labs Lab 01/02/17 0415 01/04/17 0801 01/11/2017 0417  NA 138 141 141  K 4.7 4.8 5.1  CL 108 108 108  CO2 21* 26 27  GLUCOSE 95 188* 187*  BUN 19 25* 26*  CREATININE 1.84* 1.63* 1.40*  CALCIUM 8.3* 9.2 9.4  GFRNONAA 23* 27* 32*  GFRAA 27* 31* 37*  ANIONGAP '9 7 6    '$ No results for input(s): PROT, ALBUMIN, AST, ALT, ALKPHOS, BILITOT in the last 168 hours. Hematology Recent Labs Lab 01/01/17 0326 01/02/17 0415 12/22/2016 0417  WBC 5.5 4.6 4.6  RBC 2.94* 3.09* 3.05*  HGB 8.4* 8.8* 8.6*  HCT 26.0* 27.5* 27.0*  MCV 88.4 89.0 88.5  MCH 28.6 28.5 28.2  MCHC 32.3 32.0 31.9  RDW 17.6* 17.6* 17.7*  PLT 129* 146* 129*   Cardiac EnzymesNo results for input(s): TROPONINI in the last 168 hours.  Recent Labs Lab 12/31/16 2154 01/03/2017 0427  TROPIPOC 0.01 0.02    BNP Recent Labs Lab 01/07/2017 0417  BNP 3,116.3*    DDimer No results for input(s): DDIMER in the last 168 hours.  Radiology/Studies:  Dg Chest 2 View  Result Date: 12/23/2016 CLINICAL DATA:  Shortness of breath, cough, and congestion for 2 days. History of hypertension, diabetes. EXAM: CHEST  2 VIEW COMPARISON:  01/03/2017 FINDINGS: Cardiac enlargement. Mild pulmonary vascular congestion. Right pleural effusion with atelectasis or infiltration in the right lung base. No pneumothorax. Calcified and tortuous aorta. IMPRESSION: Cardiac enlargement  with pulmonary vascular congestion. Right pleural effusion with basilar atelectasis. Similar appearance to previous study.  Electronically Signed   By: Lucienne Capers M.D.   On: 12/27/2016 04:58   Dg Chest 2 View  Result Date: 01/03/2017 CLINICAL DATA:  Shortness of breath, pre V/Q scan today. EXAM: CHEST  2 VIEW COMPARISON:  12/31/2016 FINDINGS: Bilateral mild interstitial thickening. Small bilateral pleural effusions. Prominence of the central pulmonary vasculature. No pneumothorax. Stable cardiomegaly. Single lead cardiac pacemaker. No acute osseous abnormality. IMPRESSION: Findings concerning for mild CHF. Electronically Signed   By: Kathreen Devoid   On: 01/03/2017 11:02   Nm Pulmonary Perf And Vent  Result Date: 01/03/2017 CLINICAL DATA:  83 year old female with syncope and bilateral lower extremity edema. EXAM: NUCLEAR MEDICINE VENTILATION - PERFUSION LUNG SCAN TECHNIQUE: Ventilation images were obtained in multiple projections using inhaled aerosol Tc-62mDTPA. Perfusion images were obtained in multiple projections after intravenous injection of Tc-93mAA. RADIOPHARMACEUTICALS:  32.0 mCi Technetium-9958mPA aerosol inhalation and 4.2 mCi Technetium-34m73m IV COMPARISON:  Chest radiographs 1057 hours today and earlier. FINDINGS: Ventilation: Clumping of the ventilation radiotracer about the left hilum. Suboptimal distribution but no definite ventilation defect. Perfusion: Photopenic defect along the periphery of the left long perfusion activity related to left chest cardiac pacemaker. Homogeneous perfusion radiotracer activity elsewhere. IMPRESSION: Low probability of acute pulmonary embolus. Electronically Signed   By: H  HGenevie Ann.   On: 01/03/2017 13:30    Assessment and Plan:   1. Acute on chronic CHF- combined RV and diastolic CHF  2. CAF- rate controlled. Not on anticoagulation secondary to falls and history of GI bleeding  3. MDT Pacemaker- last gen change 2014, LOV Feb 2018  4. Dementia- first time I've met this pt. She seems significantly demented. She has been living at home with family help. [facility]  [facility].   5. CRI- SCr 1.4  6. Syncope and collapse- suspect orthostatic syncope  Plan: See Dr ButcZenovia Jarrede from 9/20. Difficult situation with biventricular failure and orthostatic syncope. Suspect she will need some diuresis this admission. Will discuss with MD. Check another echo.    For questions or updates, please contact [facility] consult [email or web address] for contact info under Cardiology/STEMI.   SignAngelena Form-C  12/18/2016 11:11 AM

## 2017-01-05 NOTE — Progress Notes (Signed)
   Subjective: The patient readmitted overnight for syncopal episode. Daughters at bedside. Patient stated she was not feeling great this morning but unable to specify. Per daughter's, patient complained of dizziness at home and had an episode of urinary incontinence before LOC. Spoke to family about SNF, but declined. They are agreeable to palliative care consult.   Objective:  Vital signs in last 24 hours: Vitals:   01/14/2017 1415 12/27/2016 1430 12/22/2016 1445 12/27/2016 1500  BP: 133/74 130/70 120/83 121/69  Pulse: 98 98 94 (!) 106  Resp: 20 (!) 32 (!) 21 (!) 24  Temp:      SpO2: 100% 100% 100% 96%  Weight:      Height:       General: pleasant female, lying in bed in no acute distress, difficult to understand at times and diffusely tender on exam Cardiac: tachycardic with irregularly irregular rhythm, nl S1/S2, no murmurs, rubs or gallops  Pulm: CTAB, no wheezes or crackles, no increased work of breathing  Ext: warm and well perfused, 1+ peripheral edema bilaterally, 2+ DP pulses bilaterally  Derm: several fluid collections noted on mid back, lateral side of L elbow and medial side of R elbow    Assessment/Plan:  Ms. Gwinn is an 83yo female with PMH significant for biventricular HF, hx of SSS s/p PPM, rate-controlled a-fib (not on anticoag due to hx of GI bleeding and fall risk), hx of R MCA stroke, CKD, and dementia who presents after an unresponsive episode witnessed by her daughter.  # Biventricular HF EF 45%  Afib, rate-controlled  SSS s/p PPM  HF exacerbation  BNP 3000 and a new R pleural effusion on chest x-ray. JVD and LE edema noted on exam. Currently satting well on room air. Stable renal function. Not currently on anticoagulation due to hx of GI bleeding and fall risk; is on aspirin 81mg  daily. Dry weight ~120. Fluid collection on mid back and bilateral elbows thought to be secondary to volume overload. Will reassess after diuresis. - IV Lasix 20 mg x1 and reassess volume  status tomorrow  - Repeat TTE per cards  - Holding metoprolol as patient refusing to swallow pills - Cont home aspirin 81mg  daily - Daily weights - Strict I/Os  - Palliative care consulted  # Orthostatic hypotension leading to syncope: likely 2/2 dehydration due to poor PO intake. Patient's volume status is very tenuous. Currently diuresing gently the setting of heart failure exacerbation but trying to prevent hypertension.   # CKD: at baseline Cr 1.45. Baseline Cr ~1.5-1.6 - Continue to monitor  # UTI: asymptomatic  - Stopped antibiotics  # Dementia: currently at baseline per daughter's report. Per prior clinic visits, She has some issues with memory. She gets help with medications at home, but still lives independently. Daughters declined SNF as patient expressed wish of not being taken to a skilled nursing facility in the past.  - Palliative care consulted as above   # HTN: stable  - Holding home meds   F: none E: monitor and replete as needed N: soft diet   VTE ppx: SCDs  Code status: Full code, not confirmed   Dispo: Anticipated discharge in approximately today.  [name], MD  Internal Medicine PGY-1  P [phone]

## 2017-01-05 NOTE — H&P (Signed)
Date: 01/08/2017               Patient Name:  [name] MRN: [id number]  DOB: 1925/10/23 Age / Sex: 83 y.o., female   PCP: Sid Falcon, MD         Medical Service: Internal Medicine Teaching Service         Attending Physician: Dr. Bartholomew Crews, MD    First Contact: Dr. Frederico Hamman Pager: [phone]  Second Contact: Dr. Tiburcio Pea Pager: [phone]       After Hours (After 5p/  First Contact Pager: [phone]  weekends / holidays): Second Contact Pager: [phone]   Chief Complaint: Syncopal episode  History of Present Illness:   Ms. Humphries is a 83 y.o f with pmh of essential htn, chronic combined systolic and diastolic chf, atrial fibrillation who presented to the ED for a syncopal episode. History was provided by daughter who was at bedside. The patient woke up last night to use the bathroom and on her way back from the bathroom she informed her daughter that she was short of breath. Her daughter mentioned that once the patient lay in her bed she developed a generalized tremor, became limp, and her speech was "choppy" and her eyes rolled to the back of her head. The episode lasted 10 minutes. She didn't mention any accompanied chest pain, nausea, or vomiting.   She was previously admitted 12/31/16-01/02/17 for a syncopal episode secondary to orthostatic hypertension and responded well to fluids. She also had atrial fibrillation with rvr and was managed with metoprolol.   ED Course: Interrogated pacemaker which showed 6 episodes of ventricular high rates and varied R-R intervals with rates in 150s.  Troponin negative, bnp=3000, placed on 2L [place], ekg showing atrial fibrillation   Meds:  Current Meds  Medication Sig  . aspirin EC 81 MG tablet Take 81 mg by mouth daily.  . cephALEXin (KEFLEX) 250 MG capsule Take 1 capsule (250 mg total) by mouth 2 (two) times daily. Until 9/24  . diclofenac sodium (VOLTAREN) 1 % GEL Apply 2 g topically 4 (four) times daily.  Marland Kitchen docusate sodium  (COLACE) 100 MG capsule Take 1 capsule (100 mg total) by mouth daily as needed. (Patient taking differently: Take 100 mg by mouth daily as needed for mild constipation. )  . metoprolol tartrate (LOPRESSOR) 25 MG tablet Take 1 tablet (25 mg total) by mouth 2 (two) times daily.  . Nutritional Supplements ([place]) LIQD Take 237 mLs by mouth 3 (three) times daily.  . pantoprazole (PROTONIX) 40 MG tablet Take 1 tablet (40 mg total) by mouth daily.  . polyethylene glycol (MIRALAX / GLYCOLAX) packet Take 17 g by mouth daily as needed for mild constipation.     Allergies: Allergies as of 01/02/2017  . (No Known Allergies)   Past Medical History:  Diagnosis Date  . Adenomatous colon polyp    tubular  . Allergic rhinitis   . Anemia    Iron deficiency, does not tolerate po  . Arthritis    "all over my body"  . Atrial fibrillation (HCC)    on coumadin followed by Dr. Elie Confer  . CHF (congestive heart failure) (HCC)    diastolic dysfunction  . Constipation   . Dementia   . Diverticulosis of colon (without mention of hemorrhage)   . DJD (degenerative joint disease)   . GERD (gastroesophageal reflux disease)   . History of sick sinus syndrome s/p pacemaker Dr. Lovena Le 2000  .  History of trichomonal urethritis   . History of vaginal bleeding 05/2005   nagative endometrial biopsy  . Hypertension    well controlled on 3 agents  . Incontinence overflow, stress female    on vesicare  . Onychomycosis   . Osteoporosis   . Pacemaker   . Restrictive lung disease 1996   PFT's showed mild disease  . Stroke [facility]) 2002   R MCA, cardioembolic  . Type II diabetes mellitus (HCC)    diet controlled  . Weight loss, unintentional 2010-2011   8/08-8/10 138-146 lbs, between 8/10-10/11 lost 18 lbs., work-up negative    Family History:  Family History  Problem Relation Age of Onset  . Diabetes Sister   . Stroke Sister   . Heart disease Unknown   . Heart disease Father     Social  History:  Social History   Social History  . Marital status: Widowed    Spouse name: N/A  . Number of children: 8  . Years of education: N/A   Occupational History  . retired Retired   Social History Main Topics  . Smoking status: Never Smoker  . Smokeless tobacco: Never Used  . Alcohol use 1.2 oz/week    2 Cans of beer per week  . Drug use: No  . Sexual activity: No   Other Topics Concern  . Not on file   Social History Narrative   PPM-[employer]   [name] home# [phone]   Cell# [phone]      Patient lives alone, but has 7, of 8, living children who check in on her.   She is widowed.    Review of Systems: A complete ROS was negative except as per HPI.   Physical Exam: Blood pressure 124/76, pulse 98, temperature (!) 97.4 F (36.3 C), resp. rate 15, height 5\' 2"  (1.575 m), weight 123 lb (55.8 kg), SpO2 100 %.  Physical Exam  Constitutional: She is oriented to person, place, and time.  Eyes: Scleral icterus is present.  Neck: JVD present.  Cardiovascular: Normal rate.  An irregular rhythm present.  Pulmonary/Chest: Effort normal and breath sounds normal. No respiratory distress. She has no wheezes.  Abdominal: Soft. Bowel sounds are normal. She exhibits no distension. There is no tenderness.  Musculoskeletal: She exhibits edema (1+).  Neurological: She is alert and oriented to person, place, and time.  Skin: She is diaphoretic.    Labs: CBC: hb=8.6, rdw=17.7, platelets=129, wbc=-4.6 Bmp: na=141, k=5.1, glu=187, cr=1.4, bun=26 Bnp=3116   EKG: Atrial Fibrillation, pvc  CXR: Cardiac enlargement with pulmonary vascular congestion. Right pleural effusion with basilar atelectasis. Similar appearance to previous study.  Echo(07/23/16): - Left ventricle: The cavity size was normal. Systolic function was   mildly reduced. The estimated ejection fraction was in the range   of 45% to 50%. Diffuse hypokinesis. - Aortic valve: There was mild regurgitation. -  Mitral valve: Calcified annulus. Moderately thickened, moderately   calcified leaflets . There was mild regurgitation. - Left atrium: The atrium was moderately dilated. - Right ventricle: The cavity size was severely dilated. Wall   thickness was normal. Systolic function was severely reduced. - Right atrium: The atrium was severely dilated. Pacer wire or   catheter noted in right atrium. - Tricuspid valve: There was moderate regurgitation. - Pulmonary arteries: Systolic pressure was moderately increased.   PA peak pressure: 51 mm Hg (S). - Inferior vena cava: The vessel was dilated. The respirophasic   diameter changes were blunted (< 50%), consistent with elevated  central venous pressure.  Impressions:  - There is moderate pericardial effusion around the inferolateral   LV wall and RV free wall. No signs of tamponade.   No significant change since the prior study.  CT Head (12/31/16): Head CT: No intracranial acute or traumatic finding. Old right MCA territory infarction. Right frontal scalp injury.  Cervical spine CT: No acute or traumatic finding. Chronic degenerative spondylosis.   Assessment & Plan by Problem: Active Problems:   Syncope  Syncopal Episode The patient's syncopal episode this morning was observed by her daughter and described to have lasted 10 minutes with the patient trembling, becoming incontinent, and her eyes rolling to back of her head. It is not known if the patient had any prodromal symptoms. The patient's orthostatic vital signs were normal and therefore less likelihood of it being vasovagal syncope/orthostatic hypotension.   The patient's pacemaker did show 6 episodes of what was described as high ventricular rates and tachycardia however upon review of the report looks to be afib. The rapid ventricular rate noted on pacemaker accompanied with patient's history of sick sinus syndrome and atrial fibrillation are risk factors for cardiogenic syncope.     She has been incontinent of urine due to her recent uti for which she is still being managed with keflex. She was incontinent of urine during the syncopal episode however she showed no seizure like activity. She has a history of TIA and has some residual weakness on her right side (right facial droop noted on exam). Her recent CT head did not show any acute intracranial process. -Troponin=0.02 -Place on telemetry  Atrial Fibrillation  The patient is currently in afib per her ekg on admission. Likely causing her chf to further worsen. -Continue home po metoprolol 25mg  bid -Although CHA2DS2VASc score of 8 that puts the patient at 10.8% stroke risk per year the patient has had numerous falls and has a history of GI bleed so will place on scds.   CHF Exacerbation The patient has history of combined systolic and diastolic chf class 2. The patient's last echo (07/2016) showed an EF of 45-50%, diffuse hypokinesis, and PA peak pressure 24mmHg  On exam, the patient had 1+ pitting, jvd, but no crackles or rales.  Bnp=3116 Patient's dry weight at discharge was approximately 120lbs. -will request patient's current weight -Consider low dose lasix if patient's weight has increased and she continues to appear volume overloaded    Essential Hypertension The patient's blood pressure since admission has ranged 105-121/74-87 -Will hold home bp medications for now as patient is normotensive  Dementia -Delirium precautions  Dispo: Admit patient to Inpatient with expected length of stay greater than 2 midnights.  Signed: Lars Mage, MD Internal Medicine PGY1 Pager:[phone] 12/19/2016, 8:19 AM

## 2017-01-05 NOTE — ED Provider Notes (Signed)
TIME SEEN: 4:04 AM  CHIEF COMPLAINT: Shortness of breath, syncope  HPI: Pt is a 83 y.o. female with history of dementia, atrial fibrillation, diastolic CHF, previous stroke who presents to the emergency department with a syncopal event. Patient got up to the bathroom per her daughter's report and then appears very short of breath when walking back. She sat on the bed and then passed out for several seconds. There was no seizure like activity and no postictal period.  Patient does have a pacemaker. Daughter reports that they are setting the patient up of oxygen at home but don't come out until the morning. She was just discharged from the hospital yesterday.  ROS: Level V caveat for dementia  PAST MEDICAL HISTORY/PAST SURGICAL HISTORY:  Past Medical History:  Diagnosis Date  . Adenomatous colon polyp    tubular  . Allergic rhinitis   . Anemia    Iron deficiency, does not tolerate po  . Arthritis    "all over my body"  . Atrial fibrillation (HCC)    on coumadin followed by Dr. Elie Confer  . CHF (congestive heart failure) (HCC)    diastolic dysfunction  . Constipation   . Dementia   . Diverticulosis of colon (without mention of hemorrhage)   . DJD (degenerative joint disease)   . GERD (gastroesophageal reflux disease)   . History of sick sinus syndrome s/p pacemaker Dr. Lovena Le 2000  . History of trichomonal urethritis   . History of vaginal bleeding 05/2005   nagative endometrial biopsy  . Hypertension    well controlled on 3 agents  . Incontinence overflow, stress female    on vesicare  . Onychomycosis   . Osteoporosis   . Pacemaker   . Restrictive lung disease 1996   PFT's showed mild disease  . Stroke [facility]) 2002   R MCA, cardioembolic  . Type II diabetes mellitus (HCC)    diet controlled  . Weight loss, unintentional 2010-2011   8/08-8/10 138-146 lbs, between 8/10-10/11 lost 18 lbs., work-up negative    MEDICATIONS:  Prior to Admission medications   Medication Sig Start  Date End Date Taking? Authorizing Provider  aspirin EC 81 MG tablet Take 81 mg by mouth daily.    [provider]  cephALEXin (KEFLEX) 250 MG capsule Take 1 capsule (250 mg total) by mouth 2 (two) times daily. Until 9/24 01/04/17   [name], MD  diclofenac sodium (VOLTAREN) 1 % GEL Apply 2 g topically 4 (four) times daily.    [provider]  docusate sodium (COLACE) 100 MG capsule Take 1 capsule (100 mg total) by mouth daily as needed. Patient taking differently: Take 100 mg by mouth daily as needed for mild constipation.  06/20/16 06/20/17  Norman Herrlich, MD  loratadine (CLARITIN) 5 MG chewable tablet Chew 1 tablet (5 mg total) by mouth daily. Patient not taking: Reported on 12/31/2016 08/10/16   Rivet, Sindy Guadeloupe, MD  metoprolol tartrate (LOPRESSOR) 25 MG tablet Take 1 tablet (25 mg total) by mouth 2 (two) times daily. 01/04/17   [name], MD  Nutritional Supplements ([place]) LIQD Take 237 mLs by mouth 3 (three) times daily. 12/19/14   Zada Finders, MD  pantoprazole (PROTONIX) 40 MG tablet Take 1 tablet (40 mg total) by mouth daily. 08/10/16   Rivet, Sindy Guadeloupe, MD  polyethylene glycol (MIRALAX / GLYCOLAX) packet Take 17 g by mouth daily as needed for mild constipation. 03/20/16   Maryellen Pile, MD    ALLERGIES:  No Known Allergies  SOCIAL HISTORY:  Social History  Substance Use Topics  . Smoking status: Never Smoker  . Smokeless tobacco: Never Used  . Alcohol use 1.2 oz/week    2 Cans of beer per week    FAMILY HISTORY: Family History  Problem Relation Age of Onset  . Diabetes Sister   . Stroke Sister   . Heart disease Unknown   . Heart disease Father     EXAM: BP 135/78   Temp (!) 97.4 F (36.3 C)   Resp 18   Ht 5\' 2"  (1.575 m)   Wt 55.8 kg (123 lb)   SpO2 94%   BMI 22.50 kg/m  CONSTITUTIONAL: Alert and oriented To person only. Patient is demented and does not answer questions appropriately. She is elderly and  cachectic. HEAD: Normocephalic EYES: Conjunctivae clear, pupils appear equal, EOMI ENT: normal nose; moist mucous membranes NECK: Supple, no meningismus, no nuchal rigidity, no LAD  CARD: Irregularly irregular but rate controlled; S1 and S2 appreciated; no murmurs, no clicks, no rubs, no gallops RESP: Normal chest excursion without splinting or tachypnea; breath sounds clear and equal bilaterally; no wheezes, no rhonchi, no rales, no hypoxia on nasal cannula or respiratory distress, speaking full sentences ABD/GI: Normal bowel sounds; non-distended; soft, non-tender, no rebound, no guarding, no peritoneal signs, no hepatosplenomegaly BACK:  The back appears normal and is non-tender to palpation, there is no CVA tenderness EXT: Normal ROM in all joints; non-tender to palpation; no edema; normal capillary refill; no cyanosis, no calf tenderness or swelling    SKIN: Normal color for age and race; warm; no rash NEURO: Moves all extremities equally PSYCH: The patient's mood and manner are appropriate. Grooming and personal hygiene are appropriate.  MEDICAL DECISION MAKING: Patient here a syncopal event shortness of breath. Currently hemodynamically stable. At her neurologic baseline per daughter. We'll obtain cardiac labs, chest x-ray. We will interrogate her pacemaker.  ED PROGRESS: Troponin negative. BNP is elevated but patient does not appear falling overloaded on exam. Chest x-ray shows vascular congestion but no edema, infiltrate. She is hypoxic to 81% only take her off oxygen. She is doing well on 2 L nasal cannula. EKG shows atrial fibrillation which is chronic for her. No new ischemic abnormality.   Pt has a single chamber pacemaker.  D/w [employer]tr[employer] - pt had 6 episodes this morning of ventricular high rates with variablity in R-R intervals with rates in the 150s with the longest duration being 33 seconds.   5:48 AM  D/w Dr. [place] Lions with cardiology. He will have the daytime cardiology  team see the patient. Recommends medical admission. They will see her in consult this morning.   6:21 AM Discussed patient's case with IM resident, Dr. Hetty Ely.  I have recommended admission and patient (and family if present) agree with this plan. Admitting physician will place admission orders.   I reviewed all nursing notes, vitals, pertinent previous records, EKGs, lab and urine results, imaging (as available).     EKG Interpretation  Date/Time:  Thursday January 05 2017 04:33:02 EDT Ventricular Rate:  94 PR Interval:    QRS Duration: 93 QT Interval:  318 QTC Calculation: 398 R Axis:   -28 Text Interpretation:  Atrial fibrillation Ventricular premature complex Borderline left axis deviation Probable anterior infarct, age indeterminate Confirmed by Tannie Koskela, Cyril Mourning [phone]) on 12/18/2016 4:41:05 AM          Sotero Brinkmeyer, Delice Bison, DO 01/09/2017 [phone]

## 2017-01-05 NOTE — ED Notes (Signed)
Yellow socks and band applied to pt, falls sign on doorway.

## 2017-01-05 NOTE — ED Notes (Signed)
Returned page, asked to page different provider at [phone] - will page

## 2017-01-05 NOTE — ED Notes (Signed)
Attempted to call report

## 2017-01-05 NOTE — Progress Notes (Signed)
  Echocardiogram 2D Echocardiogram has been performed.  Tresa Res 12/31/2016, 1:48 PM

## 2017-01-05 NOTE — ED Notes (Signed)
Pt refused to drink water to try and take oral medication.

## 2017-01-05 NOTE — Progress Notes (Signed)
Pharmacy Antibiotic Note  [name] is a 83 y.o. female admitted on 12/19/2016 with UTI.  Pharmacy has been consulted for cefazolin dosing. Afeb, WBC wnl, SCr down 1.4, CrCl~21.  Plan: Cefazolin 2g IV q12h Monitor clinical progress, c/s, renal function F/u de-escalation plan/LOT   Height: 5\' 2"  (157.5 cm) Weight: 123 lb (55.8 kg) IBW/kg (Calculated) : 50.1  Temp (24hrs), Avg:97.6 F (36.4 C), Min:97.4 F (36.3 C), Max:97.8 F (36.6 C)   Recent Labs Lab 12/31/16 0626 12/31/16 2156 12/31/16 2210 01/01/17 0326 01/02/17 0415 01/04/17 0801 12/25/2016 0417  WBC 3.6*  --  4.6 5.5 4.6  --  4.6  CREATININE 1.22*  --  1.51* 1.40* 1.84* 1.63* 1.40*  LATICACIDVEN  --  2.35*  --  1.7  --   --   --     Estimated Creatinine Clearance: 21.5 mL/min (A) (by C-G formula based on SCr of 1.4 mg/dL (H)).    No Known Allergies  Elicia Lamp, PharmD, BCPS Clinical Pharmacist Rx Phone # for today: [phone] After 3:30PM, please call Main Rx: [phone] 12/25/2016 2:21 PM

## 2017-01-06 ENCOUNTER — Observation Stay (HOSPITAL_COMMUNITY): Payer: Medicare HMO

## 2017-01-06 ENCOUNTER — Encounter (HOSPITAL_COMMUNITY): Payer: Self-pay | Admitting: *Deleted

## 2017-01-06 DIAGNOSIS — I130 Hypertensive heart and chronic kidney disease with heart failure and stage 1 through stage 4 chronic kidney disease, or unspecified chronic kidney disease: Principal | ICD-10-CM

## 2017-01-06 DIAGNOSIS — I69951 Hemiplegia and hemiparesis following unspecified cerebrovascular disease affecting right dominant side: Secondary | ICD-10-CM | POA: Diagnosis not present

## 2017-01-06 DIAGNOSIS — Z8719 Personal history of other diseases of the digestive system: Secondary | ICD-10-CM | POA: Diagnosis not present

## 2017-01-06 DIAGNOSIS — Z823 Family history of stroke: Secondary | ICD-10-CM | POA: Diagnosis not present

## 2017-01-06 DIAGNOSIS — F0390 Unspecified dementia without behavioral disturbance: Secondary | ICD-10-CM | POA: Diagnosis present

## 2017-01-06 DIAGNOSIS — Z515 Encounter for palliative care: Secondary | ICD-10-CM | POA: Diagnosis not present

## 2017-01-06 DIAGNOSIS — M810 Age-related osteoporosis without current pathological fracture: Secondary | ICD-10-CM | POA: Diagnosis present

## 2017-01-06 DIAGNOSIS — I5043 Acute on chronic combined systolic (congestive) and diastolic (congestive) heart failure: Secondary | ICD-10-CM | POA: Diagnosis present

## 2017-01-06 DIAGNOSIS — I5082 Biventricular heart failure: Secondary | ICD-10-CM

## 2017-01-06 DIAGNOSIS — I110 Hypertensive heart disease with heart failure: Secondary | ICD-10-CM | POA: Diagnosis not present

## 2017-01-06 DIAGNOSIS — M1990 Unspecified osteoarthritis, unspecified site: Secondary | ICD-10-CM | POA: Diagnosis present

## 2017-01-06 DIAGNOSIS — I951 Orthostatic hypotension: Secondary | ICD-10-CM | POA: Diagnosis present

## 2017-01-06 DIAGNOSIS — R55 Syncope and collapse: Secondary | ICD-10-CM | POA: Diagnosis not present

## 2017-01-06 DIAGNOSIS — R000 Tachycardia, unspecified: Secondary | ICD-10-CM | POA: Diagnosis not present

## 2017-01-06 DIAGNOSIS — N393 Stress incontinence (female) (male): Secondary | ICD-10-CM

## 2017-01-06 DIAGNOSIS — I878 Other specified disorders of veins: Secondary | ICD-10-CM | POA: Diagnosis not present

## 2017-01-06 DIAGNOSIS — Z8673 Personal history of transient ischemic attack (TIA), and cerebral infarction without residual deficits: Secondary | ICD-10-CM | POA: Diagnosis not present

## 2017-01-06 DIAGNOSIS — I4891 Unspecified atrial fibrillation: Secondary | ICD-10-CM | POA: Diagnosis not present

## 2017-01-06 DIAGNOSIS — E875 Hyperkalemia: Secondary | ICD-10-CM | POA: Diagnosis present

## 2017-01-06 DIAGNOSIS — I270 Primary pulmonary hypertension: Secondary | ICD-10-CM | POA: Diagnosis not present

## 2017-01-06 DIAGNOSIS — Z8679 Personal history of other diseases of the circulatory system: Secondary | ICD-10-CM | POA: Diagnosis not present

## 2017-01-06 DIAGNOSIS — Z950 Presence of cardiac pacemaker: Secondary | ICD-10-CM | POA: Diagnosis not present

## 2017-01-06 DIAGNOSIS — N189 Chronic kidney disease, unspecified: Secondary | ICD-10-CM

## 2017-01-06 DIAGNOSIS — I69992 Facial weakness following unspecified cerebrovascular disease: Secondary | ICD-10-CM | POA: Diagnosis not present

## 2017-01-06 DIAGNOSIS — M7989 Other specified soft tissue disorders: Secondary | ICD-10-CM | POA: Diagnosis not present

## 2017-01-06 DIAGNOSIS — I081 Rheumatic disorders of both mitral and tricuspid valves: Secondary | ICD-10-CM | POA: Diagnosis present

## 2017-01-06 DIAGNOSIS — Z66 Do not resuscitate: Secondary | ICD-10-CM | POA: Diagnosis not present

## 2017-01-06 DIAGNOSIS — K219 Gastro-esophageal reflux disease without esophagitis: Secondary | ICD-10-CM | POA: Diagnosis present

## 2017-01-06 DIAGNOSIS — Z7982 Long term (current) use of aspirin: Secondary | ICD-10-CM | POA: Diagnosis not present

## 2017-01-06 DIAGNOSIS — E860 Dehydration: Secondary | ICD-10-CM | POA: Diagnosis present

## 2017-01-06 DIAGNOSIS — I82622 Acute embolism and thrombosis of deep veins of left upper extremity: Secondary | ICD-10-CM | POA: Diagnosis present

## 2017-01-06 DIAGNOSIS — N183 Chronic kidney disease, stage 3 (moderate): Secondary | ICD-10-CM | POA: Diagnosis present

## 2017-01-06 DIAGNOSIS — I5023 Acute on chronic systolic (congestive) heart failure: Secondary | ICD-10-CM | POA: Diagnosis not present

## 2017-01-06 DIAGNOSIS — N39490 Overflow incontinence: Secondary | ICD-10-CM

## 2017-01-06 DIAGNOSIS — I5042 Chronic combined systolic (congestive) and diastolic (congestive) heart failure: Secondary | ICD-10-CM | POA: Diagnosis not present

## 2017-01-06 DIAGNOSIS — Z833 Family history of diabetes mellitus: Secondary | ICD-10-CM | POA: Diagnosis not present

## 2017-01-06 DIAGNOSIS — E1122 Type 2 diabetes mellitus with diabetic chronic kidney disease: Secondary | ICD-10-CM | POA: Diagnosis present

## 2017-01-06 DIAGNOSIS — Z9981 Dependence on supplemental oxygen: Secondary | ICD-10-CM | POA: Diagnosis not present

## 2017-01-06 DIAGNOSIS — G9340 Encephalopathy, unspecified: Secondary | ICD-10-CM | POA: Diagnosis not present

## 2017-01-06 DIAGNOSIS — Z9181 History of falling: Secondary | ICD-10-CM | POA: Diagnosis not present

## 2017-01-06 DIAGNOSIS — R0602 Shortness of breath: Secondary | ICD-10-CM | POA: Diagnosis present

## 2017-01-06 DIAGNOSIS — Z8249 Family history of ischemic heart disease and other diseases of the circulatory system: Secondary | ICD-10-CM | POA: Diagnosis not present

## 2017-01-06 LAB — BASIC METABOLIC PANEL
Anion gap: 6 (ref 5–15)
BUN: 33 mg/dL — ABNORMAL HIGH (ref 6–20)
CALCIUM: 9.4 mg/dL (ref 8.9–10.3)
CO2: 28 mmol/L (ref 22–32)
Chloride: 109 mmol/L (ref 101–111)
Creatinine, Ser: 1.52 mg/dL — ABNORMAL HIGH (ref 0.44–1.00)
GFR calc Af Amer: 34 mL/min — ABNORMAL LOW (ref >60)
GFR calc non Af Amer: 29 mL/min — ABNORMAL LOW (ref >60)
GLUCOSE: 195 mg/dL — AB (ref 65–99)
POTASSIUM: 5.3 mmol/L — AB (ref 3.5–5.1)
Sodium: 143 mmol/L (ref 135–145)

## 2017-01-06 LAB — COMPREHENSIVE METABOLIC PANEL
ALBUMIN: 3.1 g/dL — AB (ref 3.5–5.0)
ALK PHOS: 94 U/L (ref 38–126)
ALT: 12 U/L — ABNORMAL LOW (ref 14–54)
AST: 27 U/L (ref 15–41)
Anion gap: 5 (ref 5–15)
BUN: 28 mg/dL — ABNORMAL HIGH (ref 6–20)
CALCIUM: 9.3 mg/dL (ref 8.9–10.3)
CO2: 30 mmol/L (ref 22–32)
Chloride: 108 mmol/L (ref 101–111)
Creatinine, Ser: 1.43 mg/dL — ABNORMAL HIGH (ref 0.44–1.00)
GFR calc Af Amer: 36 mL/min — ABNORMAL LOW (ref >60)
GFR calc non Af Amer: 31 mL/min — ABNORMAL LOW (ref >60)
GLUCOSE: 146 mg/dL — AB (ref 65–99)
Potassium: 5.4 mmol/L — ABNORMAL HIGH (ref 3.5–5.1)
Sodium: 143 mmol/L (ref 135–145)
Total Bilirubin: 1.1 mg/dL (ref 0.3–1.2)
Total Protein: 7.5 g/dL (ref 6.5–8.1)

## 2017-01-06 MED ORDER — ENSURE ENLIVE PO LIQD
237.0000 mL | Freq: Every day | ORAL | Status: DC
  Administered 2017-01-06 – 2017-01-09 (×8): 237 mL via ORAL

## 2017-01-06 MED ORDER — FUROSEMIDE 10 MG/ML IJ SOLN
40.0000 mg | Freq: Two times a day (BID) | INTRAMUSCULAR | Status: DC
  Administered 2017-01-06 – 2017-01-07 (×3): 40 mg via INTRAVENOUS
  Filled 2017-01-06 (×3): qty 4

## 2017-01-06 MED ORDER — SODIUM POLYSTYRENE SULFONATE 15 GM/60ML PO SUSP
15.0000 g | Freq: Once | ORAL | Status: AC
  Administered 2017-01-06: 15 g via ORAL
  Filled 2017-01-06: qty 60

## 2017-01-06 MED ORDER — FUROSEMIDE 10 MG/ML IJ SOLN
20.0000 mg | Freq: Once | INTRAMUSCULAR | Status: DC
Start: 2017-01-06 — End: 2017-01-06

## 2017-01-06 MED ORDER — FUROSEMIDE 10 MG/ML IJ SOLN
20.0000 mg | Freq: Once | INTRAMUSCULAR | Status: DC
  Filled 2017-01-06: qty 2

## 2017-01-06 MED ORDER — SODIUM POLYSTYRENE SULFONATE 15 GM/60ML PO SUSP
30.0000 g | Freq: Once | ORAL | Status: AC
  Administered 2017-01-07: 30 g via ORAL
  Filled 2017-01-06: qty 120

## 2017-01-06 NOTE — Consult Note (Signed)
Pt is an active pt with [facility] a home based palliative care program provided by [facility]. Pt in hospital after being discharged less than 24 hours. She is lethargic today and does not speak to me. However there are two children in the room. We discussed the care connection role of nurse and how hard it is for [place] to help int he home within the pt refuses our visits. They state this will be different going forward because she can no longer be at home alone anymore. They had many questions about SNF vs home care with hired caregivers or even services that offer caregiving in the home. We did discuss hospice care as well. The family states that they are concerned about her coming in and out of hospital so much. They are suppose to have a family meeting with all children with Palliative Care MD today as well. They talk of taking turns if she goes home with the daughter that does not live locally (she lives in [place]) would hire someone on her days if needed. They are beginning the process of exploring options to help there mother be more comfortable. She does have a LW and we discussed how nice it is that is in place so that they don't have to make those hard decisions for her mother that she has already made them. We discussed also that it is very hard at times to uphold the wishes that our loved ones have set in place as well. We will be able to follow the pt when discharged with Palliative care but if they choose a different route such as SNF or hospice care we would turn that over to the agency that the family chooses. Please call with any questions concerns Webb Silversmith RN [phone]

## 2017-01-06 NOTE — Progress Notes (Signed)
*  PRELIMINARY RESULTS* Vascular Ultrasound Left upper extremity venous duplex has been completed.  Preliminary findings: Partial DVT noted behind the vein valves of the left subclavian vein and left axillary vein.   Landry Mellow, RDMS, RVT  01/06/2017, 4:22 PM

## 2017-01-06 NOTE — Progress Notes (Signed)
   Subjective: Per RN, patient constantly moaning overnight. This morning, patient constantly moaning when seen, but would not complain of specific pain. States that she was cold and felt wet. Bed was not wet or soiled. Unfortunately I/Os not recorded accurately yesterday. Wt 128. States she has been eating and drinking ok, but RN reports very poor PO intake. Per daughter, patient has been drinking Ensures.   Objective:  Vital signs in last 24 hours: Vitals:   12/24/2016 1745 01/09/2017 1830 01/12/2017 2110 12/18/2016 2248  BP: (!) 152/70 117/63 124/69 (!) 119/57  Pulse: 94  (!) 103 98  Resp: 19 20 15    Temp: 98.2 F (36.8 C)  (!) 97.5 F (36.4 C)   TempSrc: Axillary  Oral   SpO2: 100%  100%   Weight:      Height: 5' (1.524 m)      General: pleasant female, malnourished, lying in bed moaning but in no acute distress  HENT: distended neck veins noted  Cardiac: regular rate and rhythm, nl S1/S2, no murmurs, rubs or gallops  Pulm: poor effort from patient, L basilar crackles, no increased work of breathing  Abd: soft, NTND, hyperactive bowel sounds Neuro: A&Ox1, able to move all four extremities  Ext: warm and well perfused, no peripheral edema  Derm: fluid collections on mid back and bilateral elbows improved in size from yesterday    Assessment/Plan:  Ms. Zeisler is an 83yo female with PMH significant for biventricular HF, hx of SSS s/p PPM, rate-controlled a-fib (not on anticoag due to hx of GI bleeding and fall risk), hx of R MCA stroke, CKD, and dementia who presents after witnessed syncopal episode.   # Biventricular HF EF 45%  Afib, rate-controlled  SSS s/p PPM  HF exacerbation  Patient is s/p IV Lasix 20 x1. Unfortunately I/Os not recorded accurately and only 400cc UOP. Wt 128 lbs today. Her BP remains stable in 110-120s/ 50-60s. Will continue gentle diuresis.  Repeat TTE with EF 20%, concentric hypertrophy, nl wall motion, severe TR, moderate MR, and reduced RV systolic function.  Cardiology following and concern for thrombosis due to significantly dilated external jugular vein.   - IV Lasix 20 mg x1 today  - F/u US great vessels per cards  - Holding metoprolol as patient refusing to swallow pills - Cont home aspirin 81mg  daily - Daily weights - Strict I/Os  - Palliative care consulted. Currently arranging family meeting.   # Hyperkalemia: K 5.2 today  - Kayexalate 15g x1  - BMP at 1400  # Orthostatic hypotension leading to syncope: likely 2/2 dehydration due to poor PO intake. Patient's volume status is very tenuous. Currently diuresing gently the setting of heart failure exacerbation but trying to prevent hypertension. BP stable.    # CKD: at baseline Cr 1.43. Baseline Cr ~1.5-1.6 - Continue to monitor  # Dementia: currently at baseline per daughter's report. Per prior clinic visits, She has some issues with memory. She gets help with medications at home, but still lives independently. Daughters declined SNF as patient expressed wish of not being taken to a skilled nursing facility in the past.  - Palliative care consulted as above   # HTN: stable  - Holding home meds as above   F: none E: monitor and replete as needed N: soft diet   VTE ppx: SCDs  Code status: Full code, not confirmed   Dispo: Anticipated discharge in approximately today.  [name], MD  Internal Medicine PGY-1  P [phone]

## 2017-01-06 NOTE — Progress Notes (Signed)
Marland Kitchen   DAILY PROGRESS NOTE   Patient Name: [name] Date of Encounter: 01/06/2017  Hospital Problem List   Active Problems:   Incontinence overflow, stress female   Syncope   Primary pulmonary HTN ([place])   Acute on chronic systolic heart failure (HCC)   Hypertensive heart and kidney disease with biventricular heart failure and stage 3 chronic kidney disease ([place])    Chief Complaint   Not feeling well today  Subjective   Echo yesterday shows further decline in LVEF to 40-45% with grade 3 DD (restrictive) and high LV filling pressure consistent with acute systolic congestive heart failure. There was mild AI and moderate MR as well as severe TR with an RVSP of 53 mmHg. Worsening valvular insufficiency could be related to decompensated CHF or have lead to it. I's/O's not adequately recorded - weight up 5 lbs? Doubt accurate. Creatinine seems to have improved with diuresis. Hyperkalemic today. Palliative care consult pending per primary service.  Objective   Vitals:   12/18/2016 2110 12/24/2016 2248 01/06/17 0643 01/06/17 0647  BP: 124/69 (!) 119/57  109/70  Pulse: (!) 103 98  92  Resp: 15   18  Temp: (!) 97.5 F (36.4 C)  97.7 F (36.5 C)   TempSrc: Oral  Oral   SpO2: 100%   (!) 76%  Weight:   128 lb 11.2 oz (58.4 kg)   Height:        Intake/Output Summary (Last 24 hours) at 01/06/17 1023 Last data filed at 01/13/2017 1714  Gross per 24 hour  Intake              200 ml  Output              400 ml  Net             -200 ml   Filed Weights   01/15/2017 0310 01/06/17 0643  Weight: 123 lb (55.8 kg) 128 lb 11.2 oz (58.4 kg)    Physical Exam   General appearance: alert and no distress Neck: JVD - several cm above sternal notch, no carotid bruit and thyroid not enlarged, symmetric, no tenderness/mass/nodules Lungs: diminished breath sounds bilaterally Heart: regular rate and rhythm, S1, S2 normal and systolic murmur: systolic ejection 3/6, crescendo at 2nd right intercostal  space Abdomen: soft, non-tender; bowel sounds normal; no masses,  no organomegaly Extremities: extremities normal, atraumatic, no cyanosis or edema Pulses: 2+ and symmetric Skin: Skin color, texture, turgor normal. No rashes or lesions Neurologic: Mental status: Alert, oriented, thought content appropriate, weak voice, dry mouth Psych: pleasant  Inpatient Medications    Scheduled Meds: . Chlorhexidine Gluconate Cloth  6 each Topical Q0600  . feeding supplement (ENSURE ENLIVE)  237 mL Oral BID BM  . furosemide  20 mg Intravenous Once  . metoprolol tartrate  25 mg Oral BID  . mupirocin ointment  1 application Nasal BID  . sodium polystyrene  15 g Oral Once    Continuous Infusions:   PRN Meds:    Labs   Results for orders placed or performed during the hospital encounter of 01/15/2017 (from the past 48 hour(s))  CBC with Differential     Status: Abnormal   Collection Time: 12/26/2016  4:17 AM  Result Value Ref Range   WBC 4.6 4.0 - 10.5 K/uL   RBC 3.05 (L) 3.87 - 5.11 MIL/uL   Hemoglobin 8.6 (L) 12.0 - 15.0 g/dL   HCT 27.0 (L) 36.0 - 46.0 %   MCV  88.5 78.0 - 100.0 fL   MCH 28.2 26.0 - 34.0 pg   MCHC 31.9 30.0 - 36.0 g/dL   RDW 17.7 (H) 11.5 - 15.5 %   Platelets 129 (L) 150 - 400 K/uL   Neutrophils Relative % 72 %   Neutro Abs 3.3 1.7 - 7.7 K/uL   Lymphocytes Relative 12 %   Lymphs Abs 0.6 (L) 0.7 - 4.0 K/uL   Monocytes Relative 15 %   Monocytes Absolute 0.7 0.1 - 1.0 K/uL   Eosinophils Relative 1 %   Eosinophils Absolute 0.0 0.0 - 0.7 K/uL   Basophils Relative 0 %   Basophils Absolute 0.0 0.0 - 0.1 K/uL  Basic metabolic panel     Status: Abnormal   Collection Time: 12/31/2016  4:17 AM  Result Value Ref Range   Sodium 141 135 - 145 mmol/L   Potassium 5.1 3.5 - 5.1 mmol/L   Chloride 108 101 - 111 mmol/L   CO2 27 22 - 32 mmol/L   Glucose, Bld 187 (H) 65 - 99 mg/dL   BUN 26 (H) 6 - 20 mg/dL   Creatinine, Ser 1.40 (H) 0.44 - 1.00 mg/dL   Calcium 9.4 8.9 - 10.3 mg/dL    GFR calc non Af Amer 32 (L) >60 mL/min   GFR calc Af Amer 37 (L) >60 mL/min    Comment: (NOTE) The eGFR has been calculated using the CKD EPI equation. This calculation has not been validated in all clinical situations. eGFR's persistently <60 mL/min signify possible Chronic Kidney Disease.    Anion gap 6 5 - 15  Brain natriuretic peptide     Status: Abnormal   Collection Time: 12/27/2016  4:17 AM  Result Value Ref Range   B Natriuretic Peptide 3,116.3 (H) 0.0 - 100.0 pg/mL  I-stat troponin, ED     Status: None   Collection Time: 01/01/2017  4:27 AM  Result Value Ref Range   Troponin i, poc 0.02 0.00 - 0.08 ng/mL   Comment 3            Comment: Due to the release kinetics of cTnI, a negative result within the first hours of the onset of symptoms does not rule out myocardial infarction with certainty. If myocardial infarction is still suspected, repeat the test at appropriate intervals.   Prealbumin     Status: Abnormal   Collection Time: 12/22/2016  8:41 AM  Result Value Ref Range   Prealbumin 6.0 (L) 18 - 38 mg/dL  Comprehensive metabolic panel     Status: Abnormal   Collection Time: 01/06/17  4:14 AM  Result Value Ref Range   Sodium 143 135 - 145 mmol/L   Potassium 5.4 (H) 3.5 - 5.1 mmol/L   Chloride 108 101 - 111 mmol/L   CO2 30 22 - 32 mmol/L   Glucose, Bld 146 (H) 65 - 99 mg/dL   BUN 28 (H) 6 - 20 mg/dL   Creatinine, Ser 1.43 (H) 0.44 - 1.00 mg/dL   Calcium 9.3 8.9 - 10.3 mg/dL   Total Protein 7.5 6.5 - 8.1 g/dL   Albumin 3.1 (L) 3.5 - 5.0 g/dL   AST 27 15 - 41 U/L   ALT 12 (L) 14 - 54 U/L   Alkaline Phosphatase 94 38 - 126 U/L   Total Bilirubin 1.1 0.3 - 1.2 mg/dL   GFR calc non Af Amer 31 (L) >60 mL/min   GFR calc Af Amer 36 (L) >60 mL/min    Comment: (NOTE) The eGFR  has been calculated using the CKD EPI equation. This calculation has not been validated in all clinical situations. eGFR's persistently <60 mL/min signify possible Chronic Kidney Disease.    Anion  gap 5 5 - 15    ECG   N/A  Telemetry   NSR - Personally Reviewed  Radiology    Dg Chest 2 View  Result Date: 01/02/2017 CLINICAL DATA:  Shortness of breath, cough, and congestion for 2 days. History of hypertension, diabetes. EXAM: CHEST  2 VIEW COMPARISON:  01/03/2017 FINDINGS: Cardiac enlargement. Mild pulmonary vascular congestion. Right pleural effusion with atelectasis or infiltration in the right lung base. No pneumothorax. Calcified and tortuous aorta. IMPRESSION: Cardiac enlargement with pulmonary vascular congestion. Right pleural effusion with basilar atelectasis. Similar appearance to previous study. Electronically Signed   By: Lucienne Capers M.D.   On: 12/19/2016 04:58    Cardiac Studies   Echocardiogram  Study Conclusions  - Left ventricle: The cavity size was normal. There was moderate   concentric hypertrophy. Systolic function was mildly to   moderately reduced. The estimated ejection fraction was in the   range of 40% to 45%. Wall motion was normal; there were no   regional wall motion abnormalities. Doppler parameters are   consistent with restrictive physiology, indicative of decreased   left ventricular diastolic compliance and/or increased left   atrial pressure. Doppler parameters are consistent with elevated   ventricular end-diastolic filling pressure. - Aortic valve: There was mild regurgitation. - Mitral valve: Moderately thickened leaflets with prolapse of trhe   anterior leaflet and posteriorly directed moderate mitral   regurgitation. Prolapse. There was moderate regurgitation   directed posteriorly. - Left atrium: The atrium was moderately dilated. - Right ventricle: Systolic function was mildly reduced. - Right atrium: The atrium was moderately dilated. - Tricuspid valve: There was severe regurgitation. - Pulmonary arteries: Systolic pressure was moderately increased.   PA peak pressure: 53 mm Hg (S). - Inferior vena cava: The vessel was  normal in size.  Impressions:  - When compared to te prior study from 07/23/16 LVEF has slightly   decreased from 45-50% to 40-45%. Mitral regurgitation is now   moderate, tricuspid regurgitation is severe, RVSP 53 mmHg.  Assessment   Active Problems:   Incontinence overflow, stress female   Syncope   Primary pulmonary HTN (HCC)   Acute on chronic systolic heart failure (HCC)   Hypertensive heart and kidney disease with biventricular heart failure and stage 3 chronic kidney disease ([place]) 1.   Plan   1. Does not appear to have significantly diuresed overnight - has external urinary catheter. History of stress incontinence  - would check bladder scan for retained urine - may need foley. Need to increase diuretics. Patient and family report little diuresis overnight and she still appears volume overloaded.  Time Spent Directly with Patient:  I have spent a total of 25 minutes with the patient reviewing hospital notes, telemetry, EKGs, labs and examining the patient as well as establishing an assessment and plan that was discussed personally with the patient. > 50% of time was spent in direct patient care.  Length of Stay:  LOS: 0 days   Pixie Casino, MD, [place]  Attending Cardiologist  Direct Dial: [phone]  Fax: [phone]  Website:  [email or web address] 01/06/2017, 10:23 AM

## 2017-01-06 NOTE — Consult Note (Signed)
Consultation Note Date: 01/06/2017   Patient Name: [name]  DOB: 06-12-1925  MRN: [id number]  Age / Sex: 83 y.o., female  PCP: Sid Falcon, MD Referring Physician: Bartholomew Crews, MD  Reason for Consultation: Establishing goals of care and Psychosocial/spiritual support  HPI/Patient Profile: 83 y.o. female  with past medical history of worsening systolic heart failure, orthostatic hypotension, atrial fib with RVR, pacemaker, syncope admitted on 01/04/2017 with worsening shortness of breath. Patient was just hospitalized from 12/31/2016 through 01/02/2017 for orthostatic hypotension. She has combined heart failure with an EF of 50%, severely dilated and reduced right ventricular systolic function, with very tenuous volume status. During her last hospitalization, her diuretics and antihypertensives were held and her orthostatic hypotension resolved. Her beta blocker was resumed on the day of discharge as she had developed A. fib with RVR. She was discharged to her daughter's house, but that night while walking to the bathroom, she became dyspneic tremulous limp and unresponsive. She was admitted for volume overload  Consult ordered for goals of care in the setting of worsening, symptomatic heart failure, multiple admissions   Clinical Assessment and Goals of Care: Met with patient and patient's 5 daughters. Patient was unable to participate in assessment, she is lethargic, encephalopathic. Patient does not have a designated healthcare power of attorney and her children are making decisions together as a family.  Initial meeting for goals of care and introduction for palliative care services, our role as an additional support and resource for patient and her family while she is in the hospital. Also compared and contrasted palliative medicine with hospice services. We did discuss in detail hospice  services that are provided both in the home/facility as well as residential facility such as [facility]. Also began discussions regarding CODE STATUS with the definition of full code and DO NOT RESUSCITATE. One daughter noted that patient would not want to be put on a ventilator  Review disease trajectory of worsening congestive heart failure, volume overload with tenuous ability to treat her with diuretics because of hypotension.   Patient is unable to speak for herself at this point, and her healthcare proxy would be her 5 daughters, or majority    [place]   Despite having said their mother would not want to be on a ventilator or have "her ribs broken", patient remains a full code at this point Plan is to meet with family again on 01/08/2017 at 3 PM Hard Choices for [employer] booklet, as well as MOST forms given to daughters to review and to be a framework for future discussions I did share with her daughters, that she does meet her hospice Medicare benefit for in-home or in facility care; she may meet criteria for residential hospice but at this point she could have more than weeks to live. Additionally, it is not clear  whether focus on comfort and symptoms is the goal vs. Full scope of treatment Introduced concepts of skilled nursing facility with rehabilitation and my concerns that because  of her orthostatic hypotension and syncope, that she would have limited ability to participate in rehabilitation secondary to her worsening heart failure  Code Status/Advance Care Planning:  Full code    Symptom Management:   Dyspnea: Continue with targeted pulmonary treatments such as oxygen; Lasix  Palliative Prophylaxis:   Aspiration, Bowel Regimen, Delirium Protocol, Frequent Pain Assessment and Turn Reposition  Additional Recommendations (Limitations, Scope, Preferences):  Full Scope Treatment  Psycho-social/Spiritual:   Desire for further Chaplaincy  support:no  Additional Recommendations: Referral to Community Resources   Prognosis:   Unable to determine  Discharge Planning: TBD. Cannot return to her home though or her daughter's home because of care needs      Primary Diagnoses: Present on Admission: . Syncope . Acute on chronic systolic heart failure ([place]) . Primary pulmonary HTN ([place]) . Hypertensive heart and kidney disease with biventricular heart failure and stage 3 chronic kidney disease ([place]) . Incontinence overflow, stress female   I have reviewed the medical record, interviewed the patient and family, and examined the patient. The following aspects are pertinent.  Past Medical History:  Diagnosis Date  . Adenomatous colon polyp    tubular  . Allergic rhinitis   . Anemia    Iron deficiency, does not tolerate po  . Arthritis    "all over my body"  . Atrial fibrillation ([place])    no anticoagulation secondary to GI bld and falls  . CHF (congestive heart failure) (HCC)    diastolic dysfunction-EF 89%  . Constipation   . Dementia   . Diverticulosis of colon (without mention of hemorrhage)   . DJD (degenerative joint disease)   . GERD (gastroesophageal reflux disease)   . History of trichomonal urethritis   . History of vaginal bleeding 05/2005   nagative endometrial biopsy  . Hypertension    well controlled on 3 agents  . Incontinence overflow, stress female    on vesicare  . Onychomycosis   . Osteoporosis   . Pacemaker    MDT 2000, and 2014  . Restrictive lung disease 1996   PFT's showed mild disease  . Stroke [facility]) 2002   R MCA, cardioembolic  . Type II diabetes mellitus (HCC)    diet controlled  . Weight loss, unintentional 2010-2011   8/08-8/10 138-146 lbs, between 8/10-10/11 lost 18 lbs., work-up negative   Social History   Social History  . Marital status: Widowed    Spouse name: N/A  . Number of children: 8  . Years of education: N/A   Occupational History  . retired Retired    Social History Main Topics  . Smoking status: Never Smoker  . Smokeless tobacco: Never Used  . Alcohol use 1.2 oz/week    2 Cans of beer per week  . Drug use: No  . Sexual activity: No   Other Topics Concern  . None   Social History Narrative   [employer]   [name] home# [phone]   Cell# [phone]      Patient lives alone, but has 7, of 8, living children who check in on her.   She is widowed.   Family History  Problem Relation Age of Onset  . Diabetes Sister   . Stroke Sister   . Heart disease Unknown   . Heart disease Father    Scheduled Meds: . Chlorhexidine Gluconate Cloth  6 each Topical Q0600  . feeding supplement (ENSURE ENLIVE)  237 mL Oral 5 X Daily  . furosemide  40 mg Intravenous BID  .  metoprolol tartrate  25 mg Oral BID  . mupirocin ointment  1 application Nasal BID   Continuous Infusions: PRN Meds:. Medications Prior to Admission:  Prior to Admission medications   Medication Sig Start Date End Date Taking? Authorizing Provider  aspirin EC 81 MG tablet Take 81 mg by mouth daily.   Yes [provider]  cephALEXin (KEFLEX) 250 MG capsule Take 1 capsule (250 mg total) by mouth 2 (two) times daily. Until 9/24 01/04/17  Yes Santos-Sanchez, Merlene Morse, MD  diclofenac sodium (VOLTAREN) 1 % GEL Apply 2 g topically 4 (four) times daily.   Yes [provider]  docusate sodium (COLACE) 100 MG capsule Take 1 capsule (100 mg total) by mouth daily as needed. Patient taking differently: Take 100 mg by mouth daily as needed for mild constipation.  06/20/16 06/20/17 Yes Norman Herrlich, MD  metoprolol tartrate (LOPRESSOR) 25 MG tablet Take 1 tablet (25 mg total) by mouth 2 (two) times daily. 01/04/17  Yes Santos-Sanchez, Merlene Morse, MD  Nutritional Supplements ([place]) LIQD Take 237 mLs by mouth 3 (three) times daily. 12/19/14  Yes Zada Finders, MD  pantoprazole (PROTONIX) 40 MG tablet Take 1 tablet (40 mg total) by mouth daily. 08/10/16  Yes  Rivet, Sindy Guadeloupe, MD  polyethylene glycol (MIRALAX / GLYCOLAX) packet Take 17 g by mouth daily as needed for mild constipation. 03/20/16  Yes Maryellen Pile, MD  loratadine (CLARITIN) 5 MG chewable tablet Chew 1 tablet (5 mg total) by mouth daily. Patient not taking: Reported on 12/31/2016 08/10/16   Rivet, Sindy Guadeloupe, MD   No Known Allergies Review of Systems  Unable to perform ROS: Dementia    Physical Exam  Constitutional:  Frail, elderly female. Minimally responsive, not participating in conversation or assessment  HENT:  Head: Normocephalic and atraumatic.  Cardiovascular:  Generalized edema  Pulmonary/Chest: Effort normal.  Neurological:  Lethargic Does not participate in discussion or open her eyes when I introduced myself to her  Skin: Skin is warm and dry.  Psychiatric:  Unable to test  Nursing note and vitals reviewed.   Vital Signs: BP 113/89   Pulse 82   Temp 97.7 F (36.5 C) (Oral)   Resp 18   Ht 5' (1.524 m)   Wt 58.4 kg (128 lb 11.2 oz)   SpO2 (!) 76%   BMI 25.13 kg/m  Pain Assessment: No/denies pain   Pain Score: 0-No pain   SpO2: SpO2: (!) 76 % O2 Device:SpO2: (!) 76 % O2 Flow Rate: .O2 Flow Rate (L/min): 5 L/min  IO: Intake/output summary:  Intake/Output Summary (Last 24 hours) at 01/06/17 1417 Last data filed at 01/14/2017 1714  Gross per 24 hour  Intake              200 ml  Output              400 ml  Net             -200 ml    LBM:   Baseline Weight: Weight: 55.8 kg (123 lb) Most recent weight: Weight: 58.4 kg (128 lb 11.2 oz)     Palliative Assessment/Data:   Flowsheet Rows     Most Recent Value  Intake Tab  Referral Department  Hospitalist  Unit at Time of Referral  Med/Surg Unit  Palliative Care Primary Diagnosis  Neurology  Date Notified  12/21/2016  Palliative Care Type  New Palliative care  Reason for referral  Clarify Goals of Care  Date of  Admission  01/12/2017  Date first seen by Palliative Care  01/06/17  # of days Palliative  referral response time  1 Day(s)  # of days IP prior to Palliative referral  0  Clinical Assessment  Palliative Performance Scale Score  40%  Pain Max last 24 hours  3  Pain Min Last 24 hours  2  Dyspnea Max Last 24 Hours  3  Dyspnea Min Last 24 hours  2  Nausea Max Last 24 Hours  2  Nausea Min Last 24 Hours  1  Anxiety Max Last 24 Hours  3  Anxiety Min Last 24 Hours  2  Psychosocial & Spiritual Assessment  Palliative Care Outcomes  Patient/Family meeting held?  Yes  Who was at the meeting?  patient, 6 children, including HCPOA daughter [name]       Time In: 1530 Time Out: 1700 Time Total: 90 min Greater than 50%  of this time was spent counseling and coordinating care related to the above assessment and plan.  Signed by: Dory Horn, NP   Please contact Palliative Medicine Team phone at [phone] for questions and concerns.  For individual provider: See Shea Evans

## 2017-01-06 NOTE — Progress Notes (Signed)
  Date: 01/06/2017  Patient name: [name]  Medical record number: [id number]  Date of birth: 08-20-1925   I have seen and evaluated this patient and I have discussed the plan of care with the house staff. Please see their note for complete details. I concur with their findings with the following additions/corrections: Sub Q fluid L side improved. Still with crackles L base, decreased on R. Cr and BP tolerating lasix. Cards increased lasix. Appreciate palliative care input.   Bartholomew Crews, MD 01/06/2017, 3:53 PM

## 2017-01-06 NOTE — Progress Notes (Signed)
Dr. Isac Sarna paged and made aware of pt's bladder scan amount of 223 ml. No new order received. Quention Mcneill Ladora Daniel, RN.

## 2017-01-07 ENCOUNTER — Inpatient Hospital Stay (HOSPITAL_COMMUNITY): Payer: Medicare HMO

## 2017-01-07 DIAGNOSIS — F0390 Unspecified dementia without behavioral disturbance: Secondary | ICD-10-CM

## 2017-01-07 DIAGNOSIS — E875 Hyperkalemia: Secondary | ICD-10-CM

## 2017-01-07 LAB — BLOOD GAS, ARTERIAL
ACID-BASE EXCESS: 3.3 mmol/L — AB (ref 0.0–2.0)
Bicarbonate: 30.7 mmol/L — ABNORMAL HIGH (ref 20.0–28.0)
DRAWN BY: 418751
O2 Content: 5 L/min
O2 SAT: 98.3 %
PCO2 ART: 80.9 mmHg — AB (ref 32.0–48.0)
Patient temperature: 98.6
pH, Arterial: 7.204 — ABNORMAL LOW (ref 7.350–7.450)
pO2, Arterial: 134 mmHg — ABNORMAL HIGH (ref 83.0–108.0)

## 2017-01-07 LAB — BASIC METABOLIC PANEL
ANION GAP: 5 (ref 5–15)
Anion gap: 8 (ref 5–15)
Anion gap: 7 (ref 5–15)
BUN: 35 mg/dL — ABNORMAL HIGH (ref 6–20)
BUN: 39 mg/dL — AB (ref 6–20)
BUN: 39 mg/dL — ABNORMAL HIGH (ref 6–20)
CALCIUM: 9.3 mg/dL (ref 8.9–10.3)
CHLORIDE: 107 mmol/L (ref 101–111)
CHLORIDE: 107 mmol/L (ref 101–111)
CO2: 29 mmol/L (ref 22–32)
CO2: 30 mmol/L (ref 22–32)
CO2: 27 mmol/L (ref 22–32)
CREATININE: 1.76 mg/dL — AB (ref 0.44–1.00)
Calcium: 9.4 mg/dL (ref 8.9–10.3)
Calcium: 9.5 mg/dL (ref 8.9–10.3)
Chloride: 108 mmol/L (ref 101–111)
Creatinine, Ser: 1.54 mg/dL — ABNORMAL HIGH (ref 0.44–1.00)
Creatinine, Ser: 1.68 mg/dL — ABNORMAL HIGH (ref 0.44–1.00)
GFR calc Af Amer: 30 mL/min — ABNORMAL LOW (ref >60)
GFR calc non Af Amer: 26 mL/min — ABNORMAL LOW (ref >60)
GFR calc non Af Amer: 24 mL/min — ABNORMAL LOW (ref >60)
GFR, EST AFRICAN AMERICAN: 28 mL/min — AB (ref >60)
GFR, EST AFRICAN AMERICAN: 33 mL/min — AB (ref >60)
GFR, EST NON AFRICAN AMERICAN: 29 mL/min — AB (ref >60)
Glucose, Bld: 125 mg/dL — ABNORMAL HIGH (ref 65–99)
Glucose, Bld: 131 mg/dL — ABNORMAL HIGH (ref 65–99)
Glucose, Bld: 265 mg/dL — ABNORMAL HIGH (ref 65–99)
POTASSIUM: 5.4 mmol/L — AB (ref 3.5–5.1)
POTASSIUM: 5.4 mmol/L — AB (ref 3.5–5.1)
Potassium: 4.5 mmol/L (ref 3.5–5.1)
SODIUM: 143 mmol/L (ref 135–145)
Sodium: 142 mmol/L (ref 135–145)
Sodium: 143 mmol/L (ref 135–145)

## 2017-01-07 LAB — GLUCOSE, CAPILLARY
GLUCOSE-CAPILLARY: 173 mg/dL — AB (ref 65–99)
GLUCOSE-CAPILLARY: 250 mg/dL — AB (ref 65–99)
Glucose-Capillary: 180 mg/dL — ABNORMAL HIGH (ref 65–99)
Glucose-Capillary: 112 mg/dL — ABNORMAL HIGH (ref 65–99)
Glucose-Capillary: 85 mg/dL (ref 65–99)

## 2017-01-07 MED ORDER — FUROSEMIDE 10 MG/ML IJ SOLN
40.0000 mg | Freq: Once | INTRAMUSCULAR | Status: AC
  Administered 2017-01-08: 40 mg via INTRAVENOUS
  Filled 2017-01-07: qty 4

## 2017-01-07 MED ORDER — DEXTROSE 50 % IV SOLN
1.0000 | Freq: Once | INTRAVENOUS | Status: AC
  Administered 2017-01-07: 50 mL via INTRAVENOUS
  Filled 2017-01-07: qty 50

## 2017-01-07 MED ORDER — INSULIN ASPART 100 UNIT/ML ~~LOC~~ SOLN
10.0000 [IU] | Freq: Once | SUBCUTANEOUS | Status: AC
  Administered 2017-01-07: 10 [IU] via SUBCUTANEOUS

## 2017-01-07 MED ORDER — ACETAMINOPHEN 325 MG PO TABS
650.0000 mg | ORAL_TABLET | Freq: Four times a day (QID) | ORAL | Status: DC | PRN
  Filled 2017-01-07: qty 2

## 2017-01-07 NOTE — Progress Notes (Signed)
  Date: 01/07/2017  Patient name: [name]  Medical record number: [id number]  Date of birth: August 10, 1925   I have seen and evaluated this patient and I have discussed the plan of care with the house staff. Please see Dr. Frederico Hamman' note for complete details. I concur with her findings.    I have known [name] for quite a few years as my continuity patient.  She has had a very significant decline in the past year with multiple hospitalizations for falls and for HF exacerbations with varying doses of her lasix to avoid orthostasis and also volume overload.  I am hopeful the family and palliative care can have a fruitful conversation tomorrow.  I think she would be best served with 24 hour care and possibly a more palliative approach instead of continued hospital stays.    Sid Falcon, MD 01/07/2017, 6:15 PM

## 2017-01-07 NOTE — Progress Notes (Signed)
IMTS Cross Cover Progress Note  Called to patient bedside by nursing due to increased agitation and dyspnea; she subsequently stopped following commands. Patient was placed on nonrebreather and ABG was drawn.   7.2/80.9/134/30.7 on 5L [place].   On our arrival patient intermittently responsive to BP cuff and sternal rub; she was able to smile but did not follow commands. She was transitioned successfully to 7L [place] and able to maintain her O2 sat. BP was stable in 90-100/50's as had been throughout the day. Decreased breath sounds on RL lung field, course breath sounds on throughout otherwise. ++JVD, minimal LE edema, pulses intact, and cold extremities.   Repeat CXR (personally reviewed) shows increased bil infiltrates, right significantly more than left, with R pleural effusion.   Per nursing patient had 150cc urine out since 7pm; last dose of lasix ~630pm.  We discussed her course with her family who came to bedside. We addressed the fact of her declining condition in setting of heart failure. Family re-affirmed full code status, however stated that the overall goal is to get her stable enough to transfer to hospice. They expressed relief that there was discussion about hospice already and are looking forward to the family meeting with palliative care on 9/23.   Family had concerns about nursing/tech staff they wish to communicate to nursing director.   Assessment & Plan: Patient with worsening HF difficult to control fluid status due to soft BP's and underlying A fib. Hypercarbia likely contributing to mental status changes. Patient not candidate for BiPAP due to MS and poor candidate for intubation due to frailty and co-morbidities. She continues to have mild increased work of breathing but overall appears comfortable.  Repeat IV lasix 40mg  now. Patient remains full code for now. There is disconnect between potential therapeutic and comfort care goals with decision makers.  We addressed family's  concerns and expectations with floor charge nurse who will further discuss what improvement's in patient's care can be obtained.   Alphonzo Grieve, MD IMTS - PG 2 Pager [phone]

## 2017-01-07 NOTE — Progress Notes (Signed)
MD aware of ABG critical values

## 2017-01-07 NOTE — Progress Notes (Signed)
   Subjective: No acute events overnight. Unfortunately, patient did not receive Kayexalate last night. Staff and family having difficulty feeding patient. This morning patient stated she is doing well and had no acute complaints.  Objective:  Vital signs in last 24 hours: Vitals:   01/06/17 1300 01/06/17 2018 01/07/17 0500 01/07/17 1023  BP: 105/67 (!) 100/40 (!) 120/92 (!) 100/48  Pulse:  79 79 88  Resp:  16 18   Temp: (!) 97.4 F (36.3 C) (!) 97.4 F (36.3 C) 97.9 F (36.6 C)   TempSrc: Oral Axillary Oral   SpO2:  100% 100%   Weight:   131 lb 8 oz (59.6 kg)   Height:       General: pleasant female, malnourished, lying in bed in no acute distress  HENT: distended neck veins noted  Cardiac: regular rate and rhythm, nl S1/S2, no murmurs, rubs or gallops  Pulm: poor effort from patient, no increased work of breathing noted  Abd: soft, NTND, hyperactive bowel sounds Neuro: A&Ox1, able to move all four extremities  Ext: warm and well perfused, no peripheral edema  Derm: fluid collections on mid back and bilateral elbows same as yesterday    Assessment/Plan:  [name] is an 83yo female with PMH significant for biventricular HF, hx of SSS s/p PPM, rate-controlled a-fib (not on anticoag due to hx of GI bleeding and fall risk), hx of R MCA stroke, CKD, and dementia who presents after witnessed syncopal episode.   # Biventricular HF EF 45%  Afib, rate-controlled  SSS s/p PPM  HF exacerbation  Patient continues to deteriorate clinically. Minimal PO intake and unable to take PO medications. She received a total of 60 mg of IV Lasix with minimal urine output. No episodes of incontinence per RN. Will order foley catheter for better UOP measurements. No urinary retention as bladder scan yesterday showed 213cc.  - Not responding to IV Lasix. Will hold IV diuresis today as BP soft with sBP 100s.  - Holding metoprolol  - Cont home aspirin 81mg  daily - Daily weights - Strict I/Os  -  Palliative care consulted. Meeting with family 9/23 at 3pm.   # Hyperkalemia: K 5.4 today. Patient able take Kayexalate yesterday. Will try on amp of D50 and NovoLog 10 units and recheck BMP later today. - Novolg 10U + D50 + CBG monitor given patient's poor PO intake  - BMP at 1700  # Orthostatic hypotension leading to syncope: likely 2/2 dehydration due to poor PO intake. Patient's volume status is very tenuous. Currently diuresing gently the setting of heart failure exacerbation but trying to prevent hypertension. BP stable.    # CKD: at baseline Cr 1.68. Baseline Cr ~1.5-1.6 - Continue to monitor  # Dementia: currently at baseline per daughter's report. Per prior clinic visits, She has some issues with memory. She gets help with medications at home, but still lives independently. Daughters declined SNF as patient expressed wish of not being taken to a skilled nursing facility in the past.  - Palliative care consulted as above   # HTN: stable  - Holding home meds as above   F: none E: monitor and replete as needed N: soft diet   VTE ppx: SCDs  Code status: Full code, confirmed   Dispo: Anticipated discharge in approximately today.  [name], MD  Internal Medicine PGY-1  P [phone]

## 2017-01-07 NOTE — Significant Event (Addendum)
Rapid Response Event Note  Overview: Time Called: 2240 Arrival Time: 2245 Event Type: Neurologic, Respiratory  Initial Focused Assessment: Called by RNs to evaluate patient for neurologic and respiratory changes.  Upon arrival, patient was obtunded, did respond to sternal rub and + gag reflex, unable to follow commands, breathing is mildly labored, oxygen sats 86% on 5L, increased to 8L, saturations did not improve.  RT was called to obtain ABG and patient was placed 15L NRB.  + JVD, lung sounds diminished, + few crackles in the upper lobe on the right. IMTS at bedside, critical ABG reported to them  Interventions: - ABG: 7.20/80/135/30.7 - partially compensated respiratory acidosis - XRAY chest - Patient is not a BIPAP candidate due to mental status - IMTS called family, rewuested them to come to bedside so that formal plan can ne determined. - Airway protection is definitely in question.  - Family arrived at 2335 and plan discussed with family by IMTS MDs  Plan of Care (if not transferred): - will keep on SDU for now - will follow as needed  Event Summary: Name of Physician Notified: IMTS MDs Paged by Primary     Outcome: Stayed in room and stabalized  End Time 0045  Chamille Werntz, [place]

## 2017-01-08 LAB — GLUCOSE, CAPILLARY
GLUCOSE-CAPILLARY: 59 mg/dL — AB (ref 65–99)
GLUCOSE-CAPILLARY: 101 mg/dL — AB (ref 65–99)
GLUCOSE-CAPILLARY: 82 mg/dL (ref 65–99)
GLUCOSE-CAPILLARY: 109 mg/dL — AB (ref 65–99)
GLUCOSE-CAPILLARY: 119 mg/dL — AB (ref 65–99)
Glucose-Capillary: 129 mg/dL — ABNORMAL HIGH (ref 65–99)
Glucose-Capillary: 90 mg/dL (ref 65–99)

## 2017-01-08 LAB — BASIC METABOLIC PANEL
Anion gap: 6 (ref 5–15)
BUN: 42 mg/dL — ABNORMAL HIGH (ref 6–20)
CALCIUM: 9.4 mg/dL (ref 8.9–10.3)
CO2: 32 mmol/L (ref 22–32)
Chloride: 109 mmol/L (ref 101–111)
Creatinine, Ser: 1.72 mg/dL — ABNORMAL HIGH (ref 0.44–1.00)
GFR, EST AFRICAN AMERICAN: 29 mL/min — AB (ref >60)
GFR, EST NON AFRICAN AMERICAN: 25 mL/min — AB (ref >60)
Glucose, Bld: 84 mg/dL (ref 65–99)
POTASSIUM: 3.8 mmol/L (ref 3.5–5.1)
Sodium: 147 mmol/L — ABNORMAL HIGH (ref 135–145)

## 2017-01-08 MED ORDER — GLYCOPYRROLATE 0.2 MG/ML IJ SOLN: 0.2000 mg | INTRAMUSCULAR | Status: DC | PRN

## 2017-01-08 MED ORDER — ACETAMINOPHEN 650 MG RE SUPP: 650.0000 mg | Freq: Three times a day (TID) | RECTAL | Status: DC | PRN

## 2017-01-08 MED ORDER — LORAZEPAM 2 MG/ML IJ SOLN: 0.2500 mg | Freq: Four times a day (QID) | INTRAMUSCULAR | Status: DC | PRN

## 2017-01-08 MED ORDER — OXYCODONE HCL 5 MG/5ML PO SOLN
2.5000 mg | ORAL | Status: DC | PRN
  Administered 2017-01-08 – 2017-01-10 (×4): 2.5 mg via ORAL
  Filled 2017-01-08 (×4): qty 5

## 2017-01-08 MED ORDER — DEXTROSE 50 % IV SOLN
1.0000 | Freq: Once | INTRAVENOUS | Status: AC
  Administered 2017-01-08: 50 mL via INTRAVENOUS

## 2017-01-08 MED ORDER — DEXTROSE 50 % IV SOLN
INTRAVENOUS | Status: AC
  Administered 2017-01-08: 50 mL
  Filled 2017-01-08: qty 50

## 2017-01-08 MED ORDER — FOOD THICKENER (SIMPLYTHICK)
2.0000 | ORAL | Status: DC | PRN
  Filled 2017-01-08: qty 2

## 2017-01-08 NOTE — Progress Notes (Signed)
One of patient's daughter expressed concern about care provided to her mother by [facility] nursing staff on 01/07/2017. She stated that food tray was left by the sink and patient was not fed, that patient was not turned and that the staff did not know what they were doing. She asked that the writer relay her concern to the DON.  The writer told her she was sorry about what happened, and that she will make sure the patient is comfortable and will be turned every two hours.  I also assured her that I will make DON aware of her concerns. Patient was alert at this time, but nonverbal. Shortly after the daughter left patient was heard yelling and upon entering the room patient was noted attempting to climb out of bed. She took her oxygen off, eyes rolled backwards, foams coming out of her mouth, labored breathing, oxygen sats in the 80's, not responding and not following commands.  Called charge nurse, paged rapid response then page IM on call MD. Please their progress notes.  Order received for Lasix IV 40 mg x 1.  Family called by IM MD.  Patient rested comfortably after this episode and seems to be back to her baseline.

## 2017-01-08 NOTE — Progress Notes (Signed)
Daily Progress Note   Patient Name: MEGGAN DHALIWAL       Date: 01/08/2017 DOB: 1925/11/14  Age: 83 y.o. MRN#: [id number] Attending Physician: Bartholomew Crews, MD Primary Care Physician: Sid Falcon, MD Admit Date: 01/12/2017  Reason for Consultation/Follow-up: Establishing goals of care, Hospice Evaluation and Psychosocial/spiritual support  Subjective: Patient continues to decline despite to support supportive measures. On the evening of 01/07/2017 rapid response had to be called because of acute onset of loss of consciousness, shortness of breath. Repeat chest x-ray showed worsening bilateral infiltrates right greater than left. Lasix was given. Patient's blood pressures remained very soft, very tentative fluid status. Additionally, ABG was drawn and showed PCO2 at 80.9. Per chart review family was called to the bedside and reiterated full code at that time, but notes in chart reflect some conversation regarding hospice going forward.   Length of Stay: 2  Current Medications: Scheduled Meds:  . Chlorhexidine Gluconate Cloth  6 each Topical Q0600  . feeding supplement (ENSURE ENLIVE)  237 mL Oral 5 X Daily  . furosemide  40 mg Intravenous BID  . metoprolol tartrate  25 mg Oral BID  . mupirocin ointment  1 application Nasal BID    Continuous Infusions:   PRN Meds: acetaminophen  Physical Exam  Constitutional:  Frail, elderly, acutely ill-appearing female Responsive only to sternal rub; nonverbal  HENT:  Head: Normocephalic and atraumatic.  Cardiovascular:  Irregular  Pulmonary/Chest:  Shallow irregular Scant upper airway congestion noted  Musculoskeletal: Normal range of motion.  Neurological:  Only responsive to sternal rub; nonverbal  Skin: Skin is warm and  dry.  Psychiatric:  Unable to test  Nursing note and vitals reviewed.           Vital Signs: BP (!) 86/48 (BP Location: Right Wrist)   Pulse (!) 112   Temp 98.1 F (36.7 C) (Axillary)   Resp 14   Ht 5' (1.524 m)   Wt 61.4 kg (135 lb 6.4 oz)   SpO2 100%   BMI 26.44 kg/m  SpO2: SpO2: 100 % O2 Device: O2 Device: Nasal Cannula O2 Flow Rate: O2 Flow Rate (L/min): 4 L/min  Intake/output summary:  Intake/Output Summary (Last 24 hours) at 01/08/17 1139 Last data filed at 01/08/17 0401  Gross per 24 hour  Intake  0 ml  Output              600 ml  Net             -600 ml   LBM: Last BM Date: 01/06/17 Baseline Weight: Weight: 55.8 kg (123 lb) Most recent weight: Weight: 61.4 kg (135 lb 6.4 oz)       Palliative Assessment/Data:    Flowsheet Rows     Most Recent Value  Intake Tab  Referral Department  Hospitalist  Unit at Time of Referral  Med/Surg Unit  Palliative Care Primary Diagnosis  Neurology  Date Notified  12/21/2016  Palliative Care Type  New Palliative care  Reason for referral  Clarify Goals of Care  Date of Admission  12/27/2016  Date first seen by Palliative Care  01/06/17  # of days Palliative referral response time  1 Day(s)  # of days IP prior to Palliative referral  0  Clinical Assessment  Palliative Performance Scale Score  40%  Pain Max last 24 hours  3  Pain Min Last 24 hours  2  Dyspnea Max Last 24 Hours  3  Dyspnea Min Last 24 hours  2  Nausea Max Last 24 Hours  2  Nausea Min Last 24 Hours  1  Anxiety Max Last 24 Hours  3  Anxiety Min Last 24 Hours  2  Psychosocial & Spiritual Assessment  Palliative Care Outcomes  Patient/Family meeting held?  Yes  Who was at the meeting?  patient, 6 children, including HCPOA daughter [name]       Patient Active Problem List   Diagnosis Date Noted  . Palliative care by specialist   . Acute encephalopathy 01/01/2017  . Hypertensive heart and kidney disease with biventricular heart failure and  stage 3 chronic kidney disease ([place])   . Occult blood in stools   . Other pancytopenia ([place])   . Acute on chronic systolic heart failure ([place]) 11/03/2016  . Knee osteoarthritis 10/12/2016  . Primary pulmonary HTN ([place])   . Venous (peripheral) insufficiency   . Acute kidney injury ([place]) 03/16/2016  . Malaise and fatigue 03/16/2016  . Syncope 12/27/2015  . Hyperglycemia 10/07/2015  . Fecal incontinence due to anorectal disorder 11/06/2014  . Health care maintenance 02/27/2014  . Malnutrition of moderate degree ([place]) 01/22/2014  . Anemia 01/21/2014  . CKD (chronic kidney disease) stage 3, GFR 30-59 ml/min 01/21/2014  . Allergic rhinitis 10/16/2013  . Thrombocytopenia ([place]) 09/08/2013  . Decreased hearing 08/23/2012  . At high risk for falls 08/10/2012  . Age-related cognitive decline 08/10/2012  . Normochromic anemia 05/15/2012  . GERD (gastroesophageal reflux disease) 06/15/2011  . Pacemaker-Medtronic 05/23/2011  . Chronic combined systolic and diastolic CHF, NYHA class 2 ([place])   . Hypertension   . Osteoporosis   . History of stroke   . Atrial fibrillation, controlled ([place])   . Incontinence overflow, stress female   . History of sick sinus syndrome   . B12 deficiency 11/05/2009  . Constipation 10/30/2009    Palliative Care Assessment & Plan   Patient Profile: 83 y.o. female  with past medical history of worsening systolic heart failure, orthostatic hypotension, atrial fib with RVR, pacemaker, syncope admitted on 12/28/2016 with worsening shortness of breath. Patient was just hospitalized from 12/31/2016 through 01/02/2017 for orthostatic hypotension. She has combined heart failure with an EF of 50%, severely dilated and reduced right ventricular systolic function, with very tenuous volume status. During her last hospitalization, her diuretics and antihypertensives were  held and her orthostatic hypotension resolved. Her beta blocker was resumed on the day of discharge as she had  developed A. fib with RVR. She was discharged to her daughter's house, but that night while walking to the bathroom, she became dyspneic tremulous limp and unresponsive. She was admitted for volume overload  Consult ordered for goals of care in the setting of worsening, symptomatic heart failure, multiple admissions. Rapid response had to be called at approximately 2245 on 01/07/2017 secondary to acute change in level of consciousness and shortness of breath   Assessment: Met with patient's daughters today for further goals of care discussion and to review recent clinical decline were rapid response team was called on the evening of 01/07/2017. When I entered the room I found the patient minimally responsive which apparently per one of her daughters at the bedside is a change from this morning. I called her name with no response, apply to sternal rub and received a slight response. She remained nonverbal  Daughters have been in a great deal of distress with her mother's illness, especially if she trends towards end-of-life. Attending was paged during our conversation as there were questions regarding continued treatment with BiPAP, CODE STATUS and ICU. Attending arrived to the unit and we continued all of our goals of care together and ultimately the 3 daughters that were present which the consensus of DO NOT RESUSCITATE and comfort care. A MOST form was filled out and signed by her daughter Adrian Blackwater in the presence of her mother 2 sisters as well as one sister via phone that is in [place]. They also requested residential hospice Recommendations/Plan:  DO NOT RESUSCITATE DO NOT INTUBATE; no BiPAP  MOST form on the chart  Family requested residential hospice but I do have some concerns as to how quickly they wish to pursue this. Daughter [name] was overwhelmed and made statements such as well "we'll just stay here for right now".  Palliative medicine provider to continue to follow-up with  family regarding disposition as well as patient's clinical status  I did leave orders for comfort medicines in terms of oxycodone liquid for dyspnea and pain, Ativan, Robinul for secretions. Staffed with bedside RN to please check with family prior to medication administration for comfort  Goals of Care and Additional Recommendations:  Limitations on Scope of Treatment: Full Comfort Care  Code Status:    Code Status Orders        Start     Ordered   01/11/2017 0806  Full code  Continuous     01/12/2017 0805    Code Status History    Date Active Date Inactive Code Status Order ID Comments User Context   01/01/2017  2:00 PM 01/04/2017 10:38 PM Full Code [id number]  Burgess Estelle, MD ED   01/01/2017 12:54 AM 01/01/2017  2:00 PM Full Code [id number]  Jule Ser, DO ED   11/16/2016  4:47 PM 11/19/2016  9:11 PM Full Code [id number]  Jule Ser, DO Inpatient   11/03/2016  4:21 PM 11/06/2016 10:14 PM DNR [id number]  Collier Salina, MD Inpatient   07/21/2016  5:49 PM 07/23/2016  8:24 PM Partial Code [id number]  Zada Finders, MD ED   03/16/2016 12:33 PM 03/21/2016 10:12 PM DNR [id number]  Raylene Miyamoto, MD ED   03/16/2016  6:51 AM 03/16/2016 12:33 PM Full Code [id number]  Jule Ser, DO ED   12/27/2015  8:53 PM 12/29/2015  5:25 PM Full Code [id number]  Zada Finders, MD Inpatient  03/14/2015  2:37 PM 03/15/2015  8:04 PM Full Code [id number]  Juliet Rude, MD Inpatient   12/14/2014  4:12 PM 12/16/2014  6:43 PM DNR [id number]  Lucious Groves, DO Inpatient   01/21/2014  3:15 PM 01/24/2014  7:53 PM DNR [id number]  Lucious Groves, DO Inpatient   09/05/2013  1:00 AM 09/10/2013 11:41 PM Full Code [id number]  Otho Bellows, MD Inpatient   05/14/2012  4:42 PM 05/16/2012  7:01 PM Full Code [id number]  Thelma Comp, RN Inpatient    Advance Directive Documentation     Most Recent Value  Type of Advance Directive  Living will, Healthcare Power of Attorney  Pre-existing out of facility DNR order (yellow  form or pink MOST form)  -  "MOST" Form in Place?  -       Prognosis:   Hours - Days  Discharge Planning:  Residential hospice versus hospital death  Care plan was discussed with attending  Thank you for allowing the Palliative Medicine Team to assist in the care of this patient.   Time In: 1500 Time Out: 1615 Total Time 75 min Prolonged Time Billed  yes       Greater than 50%  of this time was spent counseling and coordinating care related to the above assessment and plan.  Dory Horn, NP  Please contact Palliative Medicine Team phone at [phone] for questions and concerns.

## 2017-01-08 NOTE — Progress Notes (Signed)
   Subjective:  Patient had an eventful night and per patient's daughter, she had another one of her episodes of unresponsiveness.  Night team was called about increased agitation and dyspnea, and had ABG drawn which was 7.2/80.9/134/30.7 on 5L [place].  Repeat CXR shows increased bil infiltrates, right significantly more than left, with R pleural effusion. Even with the foley, patient had minimal urine output.   THis morning, pt is arousable to commands and is alert to her name. SHe is still on 7L oxygen. I discussed with the patient's daughter again about her declining status, and explored the option of of DNR- however, they would like the patient to remain full code for now. I am hopeful that the palliative meeting this afternoon will be helpful is deciding hospice and reverting the patient to DNR which would be our recommendation.  Later in the afternoon, I met with palliative care and per patient's family request , she is DNR now with full comfort care.   Objective:  Vital signs in last 24 hours: Vitals:   01/08/17 0151 01/08/17 0354 01/08/17 0605 01/08/17 0752  BP: 98/81 (!) 81/63 (!) 104/53 (!) 86/48  Pulse: 72 89    Resp: 15 14    Temp: (!) 96.9 F (36.1 C) 98.1 F (36.7 C)    TempSrc: Axillary Axillary    SpO2: 100% 100%    Weight:  135 lb 6.4 oz (61.4 kg)    Height:       General: pleasant female, malnourished, lying in bed in no acute distress, on 6L oxygen, arousable to commands  HENT: distended neck veins noted  Cardiac: irregular rate and rhythm, nl S1/S2, no murmurs, rubs or gallops  Pulm: poor effort from patient, no increased work of breathing noted  Abd: soft, NTND, normal bowel sounds  Ext: warm and well perfused, no peripheral edema - left arm does have some edema. But there is partial DVT noted behind the valves of the left subclavian vein.   Assessment/Plan:  Ms. Seyer is an 83yo female with PMH significant for biventricular HF, hx of SSS s/p PPM,  rate-controlled a-fib (not on anticoag due to hx of GI bleeding and fall risk), hx of R MCA stroke, CKD, and dementia who presents after witnessed syncopal episode.   # Biventricular HF EF 45%  Afib, rate-controlled  SSS s/p PPM  HF exacerbation  Patient continues to deteriorate clinically. Minimal PO intake and unable to take PO medications. She received a total of 60 mg of IV Lasix with minimal urine output.Patient is currently on a foley catheter which shows minimal urine output.  Due to the patient being comfort care now, we will stop the lasix.  - stop lasix and aspirin -holding metoprolol due to soft BP- can eventually stop it -stop SCDs, pulse ox, and strict I&O -Family will start looking for hospice place  F: none E: monitor and replete as needed N: soft diet   VTE ppx: SCDs  Code status: DNR  Dispo: Anticipated discharge to hospice  Signed Burgess Estelle, MD MPH IMTS [phone]

## 2017-01-08 NOTE — Progress Notes (Signed)
I met with Palliative care this afternoon along with the three daughters, and they have decided to make the patient DNR. We also collectively filled out the MOST form, and the daughters indicated that they also would not want BiPAP.  SIgned Burgess Estelle, MD, MPH IMTS [phone]

## 2017-01-08 NOTE — Progress Notes (Signed)
  Date: 01/08/2017  Patient name: [name]  Medical record number: [id number]  Date of birth: [birth year]   I have seen and evaluated this patient and I have discussed the plan of care with the house staff. Please see Dr. Sherlynn Carbon note for complete details. I concur with his findings.  Also discussed hospice with the patient and family.  They seem to be prepared for this eventuality.   Sid Falcon, MD 01/08/2017, 12:47 PM

## 2017-01-09 ENCOUNTER — Encounter (HOSPITAL_COMMUNITY): Payer: Self-pay

## 2017-01-09 DIAGNOSIS — Z66 Do not resuscitate: Secondary | ICD-10-CM

## 2017-01-09 DIAGNOSIS — R000 Tachycardia, unspecified: Secondary | ICD-10-CM

## 2017-01-09 DIAGNOSIS — Z8673 Personal history of transient ischemic attack (TIA), and cerebral infarction without residual deficits: Secondary | ICD-10-CM

## 2017-01-09 NOTE — Clinical Social Work Note (Signed)
Clinical Social Work Assessment  Patient Details  Name: [name] MRN: [id number] Date of Birth: [birth year]  Date of referral:  01/09/17               Reason for consult:  End of Life/Hospice                Permission sought to share information with:  Facility Sport and exercise psychologist, Family Supports Permission granted to share information::  Yes, Verbal Permission Granted  Name::     Adrian Blackwater  Agency::  residential hospice  Relationship::  daughter  Contact Information:  [phone]  Housing/Transportation Living arrangements for the past 2 months:  Single Family Home Source of Information:  Adult Children Patient Interpreter Needed:  None Criminal Activity/Legal Involvement Pertinent to Current Situation/Hospitalization:  No - Comment as needed Significant Relationships:  Adult Children Lives with:    Do you feel safe going back to the place where you live?  Yes Need for family participation in patient care:  Yes (Comment)  Care giving concerns: Patient nearing end of life. CSW consulted for residential hospice placement.   Social Worker assessment / plan: CSW met with patient's daughter, [name], at bedside. Patient asleep. CSW discussed residential hospice placement and daughter agreeable. Daughter prefers [facility]. CSW made referral to [facility]. CSW to follow and support with placement. If no beds at [facility], [place] to make referrals to alternate residential hospice.  Employment status:  Retired Forensic scientist:  [employer] safety [employer]) PT Recommendations:  Home with [place] / Referral to community resources:  Other (Comment Required) (residential hospice)  Patient/Family's Response to care: Daughter appreciative of care.  Patient/Family's Understanding of and Emotional Response to Diagnosis, Current Treatment, and Prognosis: Daughter understanding of patient's prognosis and hopeful for residential hospice placement.  Emotional  Assessment Appearance:  Appears stated age Attitude/Demeanor/Rapport:  Unable to Assess (patient asleep) Affect (typically observed):  Unable to Assess (patient asleep) Orientation:   (disoriented x4) Alcohol / Substance use:  Not Applicable Psych involvement (Current and /or in the community):  No (Comment)  Discharge Needs  Concerns to be addressed:  Discharge Planning Concerns, Other (Comment Required (hospice) Readmission within the last 30 days:  Yes Current discharge risk:  Physical Impairment, Terminally ill Barriers to Discharge:  Continued Medical Work up   Estanislado Emms, LCSW 01/09/2017, 11:18 AM

## 2017-01-09 NOTE — Progress Notes (Signed)
Nutrition Brief Note  Chart reviewed due to Malnutrition Screening Tool score of 2. Pt now transitioning to comfort care. Plans for residential hospice as prognosis is very poor. Palliative Care Team is following. No nutrition interventions warranted at this time.  Please consult as needed.   Molli Barrows, RD, LDN, [place] Pager [phone] After Hours Pager [phone]

## 2017-01-09 NOTE — Consult Note (Signed)
[place] Nurse wound consult note Reason for Consult: remove stiches from eyelid Patient sustained a fall 2 weeks ago per family in the room. Eye was stitched at that time.   Wound type: trauma  Measurement: 2.5cm long Wound bed: closed with sutures Drainage (amount, consistency, odor) none Periwound:some minimally edema Dressing procedure/placement/frequency:  Attempted to remove 4 sutures, however I was only able to remove 2.  Daughter is aware to have Hospice attempt to remove other later. One of the sutures when I lift to cut the skin is separated, so I decided to leave that one in place One of the sutures will not pull through.  I will reassess Wednesday if she is still inpatient.  Discussed POC with patient's family and bedside nurse.   Thanks  Brandonlee Navis [employer], RN,CWOCN, CNS, [place] [phone])

## 2017-01-09 NOTE — Progress Notes (Signed)
[facility] Liaison Note:  Received phone call from Estanislado Emms, LCSW to make aware of family interest in transfer to [facility].  Met with daughter, [name] to confirm interest and answer questions.  Unfortunately, there is no bed availability today.  [facility] will follow up with SW and family tomorrow, regarding bed availability.  Left contact information for [facility] if any questions arise.  Please contact with any additional questions.  Thank you for this referral,  Freddi Starr RN, BSN [facility] Liaison [phone]  All liaisons available on [place].

## 2017-01-09 NOTE — Progress Notes (Signed)
Daily Progress Note   Patient Name: [name]       Date: 01/09/2017 DOB: 1925/12/12  Age: 83 y.o. MRN#: [id number] Attending Physician: Bartholomew Crews, MD Primary Care Physician: Sid Falcon, MD Admit Date: 01/14/2017  Reason for Consultation/Follow-up: Establishing goals of care, Hospice Evaluation and Psychosocial/spiritual support  Subjective: Patient is resting in bed with eyes closed.  Family at bedside  I have discussed with them in detail about comfort measures, residential hospice, prognosis, hospice philosophy of care.   See below   Length of Stay: 3  Current Medications: Scheduled Meds:  . Chlorhexidine Gluconate Cloth  6 each Topical Q0600  . feeding supplement (ENSURE ENLIVE)  237 mL Oral 5 X Daily  . mupirocin ointment  1 application Nasal BID    Continuous Infusions:   PRN Meds: acetaminophen, acetaminophen, food thickener, glycopyrrolate, LORazepam, oxyCODONE  Physical Exam  Constitutional:  Frail, elderly, acutely ill-appearing female Responsive only to sternal rub; nonverbal  HENT:  Head: Normocephalic and atraumatic.  Cardiovascular:  Irregular  Pulmonary/Chest:  Shallow irregular Scant upper airway congestion noted  Musculoskeletal: Normal range of motion.  Neurological:  Only responsive to sternal rub; nonverbal  Skin: Skin is warm and dry.  Psychiatric:  Unable to test  Nursing note and vitals reviewed.           Vital Signs: BP 102/60 (BP Location: Right Wrist)   Pulse 93   Temp 97.6 F (36.4 C) (Oral)   Resp 20   Ht 5' (1.524 m)   Wt 61.4 kg (135 lb 6.4 oz)   SpO2 97%   BMI 26.44 kg/m  SpO2: SpO2: 97 % O2 Device: O2 Device: Nasal Cannula O2 Flow Rate: O2 Flow Rate (L/min): 4 L/min  Intake/output summary:    Intake/Output Summary (Last 24 hours) at 01/09/17 1106 Last data filed at 01/09/17 0813  Gross per 24 hour  Intake                0 ml  Output              650 ml  Net             -650 ml   LBM: Last BM Date: 01/06/17 Baseline Weight: Weight: 55.8 kg (123 lb) Most recent weight: Weight: 61.4 kg (135 lb  6.4 oz)       Palliative Assessment/Data: PPS 10%   Flowsheet Rows     Most Recent Value  Intake Tab  Referral Department  Hospitalist  Unit at Time of Referral  Med/Surg Unit  Palliative Care Primary Diagnosis  Neurology  Date Notified  01/01/2017  Palliative Care Type  New Palliative care  Reason for referral  Clarify Goals of Care  Date of Admission  01/04/2017  Date first seen by Palliative Care  01/06/17  # of days Palliative referral response time  1 Day(s)  # of days IP prior to Palliative referral  0  Clinical Assessment  Palliative Performance Scale Score  40%  Pain Max last 24 hours  3  Pain Min Last 24 hours  2  Dyspnea Max Last 24 Hours  3  Dyspnea Min Last 24 hours  2  Nausea Max Last 24 Hours  2  Nausea Min Last 24 Hours  1  Anxiety Max Last 24 Hours  3  Anxiety Min Last 24 Hours  2  Psychosocial & Spiritual Assessment  Palliative Care Outcomes  Patient/Family meeting held?  Yes  Who was at the meeting?  patient, 6 children, including HCPOA daughter [name]       Patient Active Problem List   Diagnosis Date Noted  . Palliative care by specialist   . Acute encephalopathy 01/01/2017  . Hypertensive heart and kidney disease with biventricular heart failure and stage 3 chronic kidney disease ([place])   . Occult blood in stools   . Other pancytopenia ([place])   . Acute on chronic systolic heart failure ([place]) 11/03/2016  . Knee osteoarthritis 10/12/2016  . Primary pulmonary HTN ([place])   . Venous (peripheral) insufficiency   . Acute kidney injury ([place]) 03/16/2016  . Malaise and fatigue 03/16/2016  . Syncope 12/27/2015  . Hyperglycemia 10/07/2015  . Fecal  incontinence due to anorectal disorder 11/06/2014  . Health care maintenance 02/27/2014  . Malnutrition of moderate degree ([place]) 01/22/2014  . Anemia 01/21/2014  . CKD (chronic kidney disease) stage 3, GFR 30-59 ml/min 01/21/2014  . Allergic rhinitis 10/16/2013  . Thrombocytopenia ([place]) 09/08/2013  . Decreased hearing 08/23/2012  . At high risk for falls 08/10/2012  . Age-related cognitive decline 08/10/2012  . Normochromic anemia 05/15/2012  . GERD (gastroesophageal reflux disease) 06/15/2011  . Pacemaker-Medtronic 05/23/2011  . Chronic combined systolic and diastolic CHF, NYHA class 2 ([place])   . Hypertension   . Osteoporosis   . History of stroke   . Atrial fibrillation, controlled ([place])   . Incontinence overflow, stress female   . History of sick sinus syndrome   . B12 deficiency 11/05/2009  . Constipation 10/30/2009    Palliative Care Assessment & Plan   Patient Profile: 83 y.o. female  with past medical history of worsening systolic heart failure, orthostatic hypotension, atrial fib with RVR, pacemaker, syncope admitted on 12/27/2016 with worsening shortness of breath. Patient was just hospitalized from 12/31/2016 through 01/02/2017 for orthostatic hypotension. She has combined heart failure with an EF of 50%, severely dilated and reduced right ventricular systolic function, with very tenuous volume status. During her last hospitalization, her diuretics and antihypertensives were held and her orthostatic hypotension resolved. Her beta blocker was resumed on the day of discharge as she had developed A. fib with RVR. She was discharged to her daughter's house, but that night while walking to the bathroom, she became dyspneic tremulous limp and unresponsive. She was admitted for volume overload  Consult ordered for  goals of care in the setting of worsening, symptomatic heart failure, multiple admissions. Rapid response had to be called at approximately 2245 on 01/07/2017 secondary to  acute change in level of consciousness and shortness of breath   Assessment: Continues to decline Recommendations/Plan:  continue oxycodone liquid for dyspnea and pain, Ativan, Robinul for secretions.   CSW consult for residential hospice placement, it appears that the patient's family prefers [facility] and Additional Recommendations:  Limitations on Scope of Treatment: Full Comfort Care  Code Status:    Code Status Orders        Start     Ordered   12/17/2016 0806  Full code  Continuous     12/31/2016 0805    Code Status History    Date Active Date Inactive Code Status Order ID Comments User Context   01/01/2017  2:00 PM 01/04/2017 10:38 PM Full Code [id number]  Burgess Estelle, MD ED   01/01/2017 12:54 AM 01/01/2017  2:00 PM Full Code [id number]  Jule Ser, DO ED   11/16/2016  4:47 PM 11/19/2016  9:11 PM Full Code [id number]  Jule Ser, DO Inpatient   11/03/2016  4:21 PM 11/06/2016 10:14 PM DNR [id number]  Collier Salina, MD Inpatient   07/21/2016  5:49 PM 07/23/2016  8:24 PM Partial Code [id number]  Zada Finders, MD ED   03/16/2016 12:33 PM 03/21/2016 10:12 PM DNR [id number]  Raylene Miyamoto, MD ED   03/16/2016  6:51 AM 03/16/2016 12:33 PM Full Code [id number]  Jule Ser, [facility]   12/27/2015  8:53 PM 12/29/2015  5:25 PM Full Code [id number]  Zada Finders, MD Inpatient   03/14/2015  2:37 PM 03/15/2015  8:04 PM Full Code [id number]  Juliet Rude, MD Inpatient   12/14/2014  4:12 PM 12/16/2014  6:43 PM DNR [id number]  Lucious Groves, DO Inpatient   01/21/2014  3:15 PM 01/24/2014  7:53 PM DNR [id number]  Lucious Groves, DO Inpatient   09/05/2013  1:00 AM 09/10/2013 11:41 PM Full Code [id number]  Otho Bellows, MD Inpatient   05/14/2012  4:42 PM 05/16/2012  7:01 PM Full Code [id number]  Thelma Comp, RN Inpatient    Advance Directive Documentation     Most Recent Value  Type of Advance Directive  Living will, Healthcare Power of Attorney  Pre-existing out of  facility DNR order (yellow form or pink MOST form)  -  "MOST" Form in Place?  -       Prognosis:   Hours - Days Less than 2 weeks  Discharge Planning:  Residential hospice  Care plan was discussed with HCPOA daughter [name].   Thank you for allowing the Palliative Medicine Team to assist in the care of this patient.   Time In: 10.30 Time Out: 11 Total Time 30 Prolonged Time Billed No       Greater than 50%  of this time was spent counseling and coordinating care related to the above assessment and plan.  Loistine Chance, MD [phone]  Please contact Palliative Medicine Team phone at [phone] for questions and concerns.

## 2017-01-09 NOTE — Progress Notes (Signed)
   Subjective: No acute events overnight. Patient is lying in bed when seen with no acute complaints. She is sleeping but awakes on external rub and responds to questions. There are three daughters at bedside who expressed concerns regarding PO intake and hospice bed status. Advised to let patient eat when she asks to. They understand the priority is to keep patient comfortable at this time.   Objective:  Vital signs in last 24 hours: Vitals:   01/08/17 1338 01/08/17 2000 01/08/17 2036 01/09/17 0400  BP: (!) 98/41  102/60   Pulse: 96 (!) 109 99 93  Resp: 13 19 (!) 21 20  Temp: 97.7 F (36.5 C)  97.6 F (36.4 C)   TempSrc: Axillary  Oral   SpO2: 100% 99% 100% 97%  Weight:      Height:       General: pleasant female, ill-appearing, malnourished, lying in bed in no acute distress  Neck: significant neck vein distention Cardiac: tachycardic, irregularly irregular rhythm, nl S1/S2, no murmurs, rubs or gallops   Assessment/Plan:  Ms. Henion is an 83yo female with PMH significant for biventricular HF, hx of SSS s/p PPM, rate-controlled a-fib (not on anticoag due to hx of GI bleeding and fall risk), hx of R MCA stroke, CKD, and dementia who presents after witnessed syncopal episode.   # Biventricular HF EF 45%  Afib, rate-controlled  SSS s/p PPM  HF exacerbation  Patient continues to deteriorate clinically. Minimal PO intake and unable to take PO medications. She received a total of 60 mg of IV Lasix with minimal urine output.Patient is currently on a foley catheter which shows minimal urine output. Patient remains comfort care. - Allow patient to take PO intake as she likes  - Oxycodone 5mg  soln PRN + IV Ativan q6h PRN + Robinul 0.2mg  q4h PRN per palliative care recommendations  - Will follow up with SW regarding hospice placement    F: none E: monitor and replete as needed N: soft diet   VTE ppx: None   Code status: DNR/DNI, comfort care   Dispo: Anticipated discharge to  home hospice  [name], MD  Internal Medicine PGY-1  P [phone]

## 2017-01-09 NOTE — Progress Notes (Signed)
  Date: 01/09/2017  Patient name: CYLIE DOR  Medical record number: [id number]  Date of birth: 22-May-1925   I have seen and evaluated this patient and I have discussed the plan of care with the house staff. Please see their note for complete details. I concur with their findings with the following additions/corrections: Daughters had a lot of questions about PO intake.. Stressed comfort feeding only. [facility] has no bed open, soc work helping family with back up facility.   Bartholomew Crews, MD 01/09/2017, 4:07 PM

## 2017-01-09 NOTE — Progress Notes (Signed)
CSW consulted with [facility] liaison, [place]. [facility] does not have a bed available today. MD updated. CSW met with patient's daughter at bedside again and daughter declined to choose backup facility, stating "maybe [facility]" and planned to discuss with other family members. Daughter explained it would be a hardship for her and other family to travel to a facility outside of [place], but understands the need to choose another residential facility. [facility] planned to re-evaluate tomorrow morning and determine if they have availability for patient. CSW to follow up with [facility] and patient's family as well and determine discharge plan. CSW to support with transition to residential hospice.  Estanislado Emms, [place]

## 2017-01-09 NOTE — Plan of Care (Signed)
Problem: Safety: Goal: Ability to remain free from injury will improve Outcome: Progressing Bed alarm on, family at bedside, call light and personal items within reach.  Problem: Pain Managment: Goal: General experience of comfort will improve Outcome: Progressing PO pain meds effective.  Problem: Physical Regulation: Goal: Ability to maintain clinical measurements within normal limits will improve Outcome: Progressing VSS.

## 2017-01-11 ENCOUNTER — Ambulatory Visit: Payer: Medicare HMO

## 2017-01-11 NOTE — Telephone Encounter (Signed)
Pt is deceased. 

## 2017-01-16 NOTE — Progress Notes (Signed)
   Subjective: No acute events overnight. Patient expired this morning at [phone]. Spoke to patient's daughter [name] and offer my condolences and answered all her questions. Advised daughter to let team know if she has further questions or if we can be of assistance to her and her family.   Objective:  Vital signs in last 24 hours: Vitals:   01/08/17 2036 01/09/17 0400 01/09/17 1520 01/09/17 1950  BP: 102/60  (!) 115/39 (!) 97/51  Pulse: 99 93  (!) 105  Resp: (!) 21 20  (!) 24  Temp: 97.6 F (36.4 C)  (!) 97.4 F (36.3 C) 97.6 F (36.4 C)  TempSrc: Oral  Oral Axillary  SpO2: 100% 97%  100%  Weight:      Height:       Physical exam: Not examined this morning as patient expired this AM at [phone].    Assessment/Plan:   Patient expired this morning at 0611    Dispo: Anticipated discharge to home hospice  [name], MD  Internal Medicine PGY-1  P [phone]

## 2017-01-16 NOTE — Death Summary Note (Signed)
  Name: [name] MRN: [id number] DOB: [birth year] 83 y.o.  Date of Admission: 12/18/2016  3:08 AM Date of Discharge: 01/27/2017 Attending Physician: Bartholomew Crews, MD  Discharge Diagnosis: Active Problems:   Incontinence overflow, stress female   Syncope   Primary pulmonary HTN ([place])   Acute on chronic systolic heart failure ([facility])   Hypertensive heart and kidney disease with biventricular heart failure and stage 3 chronic kidney disease ([place])   Palliative care by specialist   Cause of death: Complications of biventricular heart failure  Time of death: 0611  Disposition and follow-up:   [name] was discharged from [facility] in expired condition.    Hospital Course:  [name] is an 83yo female with history of biventricular HF, hx of SSS s/p PPM, rate-controlled a-fib (not on anticoag due to hx of GI bleeding and fall risk), hx of R MCA stroke, CKD, and dementia who presents after witnessed syncopal episode and found to have heart failure exacerbation due to holding diuresis in the setting of orthostatic hypotension.   1. Heart failure exacerbation: Patient found to have shortness of breath and mildly hypoxic on admission. This was believed to be secondary to stopping home Lasix on previous admission due to orthostatic hypotension. Cardiology was consulted and recommended gentle diureses with IV Lasix 40 mg given tenuous volume status. She had minimal urine output on this regimen, but unable to further diurese due to soft blood pressure. She was also found to have partial DVTs on L subclavian and L axillary veins. Palliative care consulted given decline in patient's status and family decided to make patient DNR/DNI and comfort care. She expired on 9/25 at 0611.   2. Syncope: Patient presented as readmission after a witnessed syncopal episode at home. She had been previously admitted for orthostatic hypotension and home Lasix stopped due to  hypotension. Home Lasix was not restarted on discharge. Syncope likely secondary to orthostasis vs low output given severe tricuspid regurgitation seen on TTE. She was made comfort care as described above and expired on 9/25 at 0611.   3. Atrial fibrillation: Not on anticoagulation due to history of GI bleed and frequent falls. Patient with poor PO intake during admission and unable to take PO metoprolol. She developed Afib with RVR, but remained asymptomatic. Metoprolol not restarted as patient was made comfort care and expired on 9/25 at 0611.   Signed: [name], MD  Internal Medicine PGY-1  P [phone]

## 2017-01-16 NOTE — Progress Notes (Signed)
Central tele monitor tech notified this RN that pt's monitor was showing asystole.  Upon arrival to room, pt was unresponsive with no pulse and no respirations.  Time of death called at [phone].  Absence of heart sounds and breath sounds confirmed by Jamal Collin, RN and this RN.  Pt's daughter at bedside; states she will inform the rest of the family.  Chaplain paged.  Jodell Cipro

## 2017-01-16 DEATH — deceased

## 2017-02-15 ENCOUNTER — Ambulatory Visit: Payer: Medicare HMO | Admitting: Internal Medicine

## 2019-02-08 IMAGING — CR DG CHEST 2V
2 series · 2 of 2 positions shown · non-contrast
Comparison: 03/18/2016

CLINICAL DATA: Right-sided chest pain and shortness of Breath

EXAM:
CHEST  2 VIEW

[chest lat]
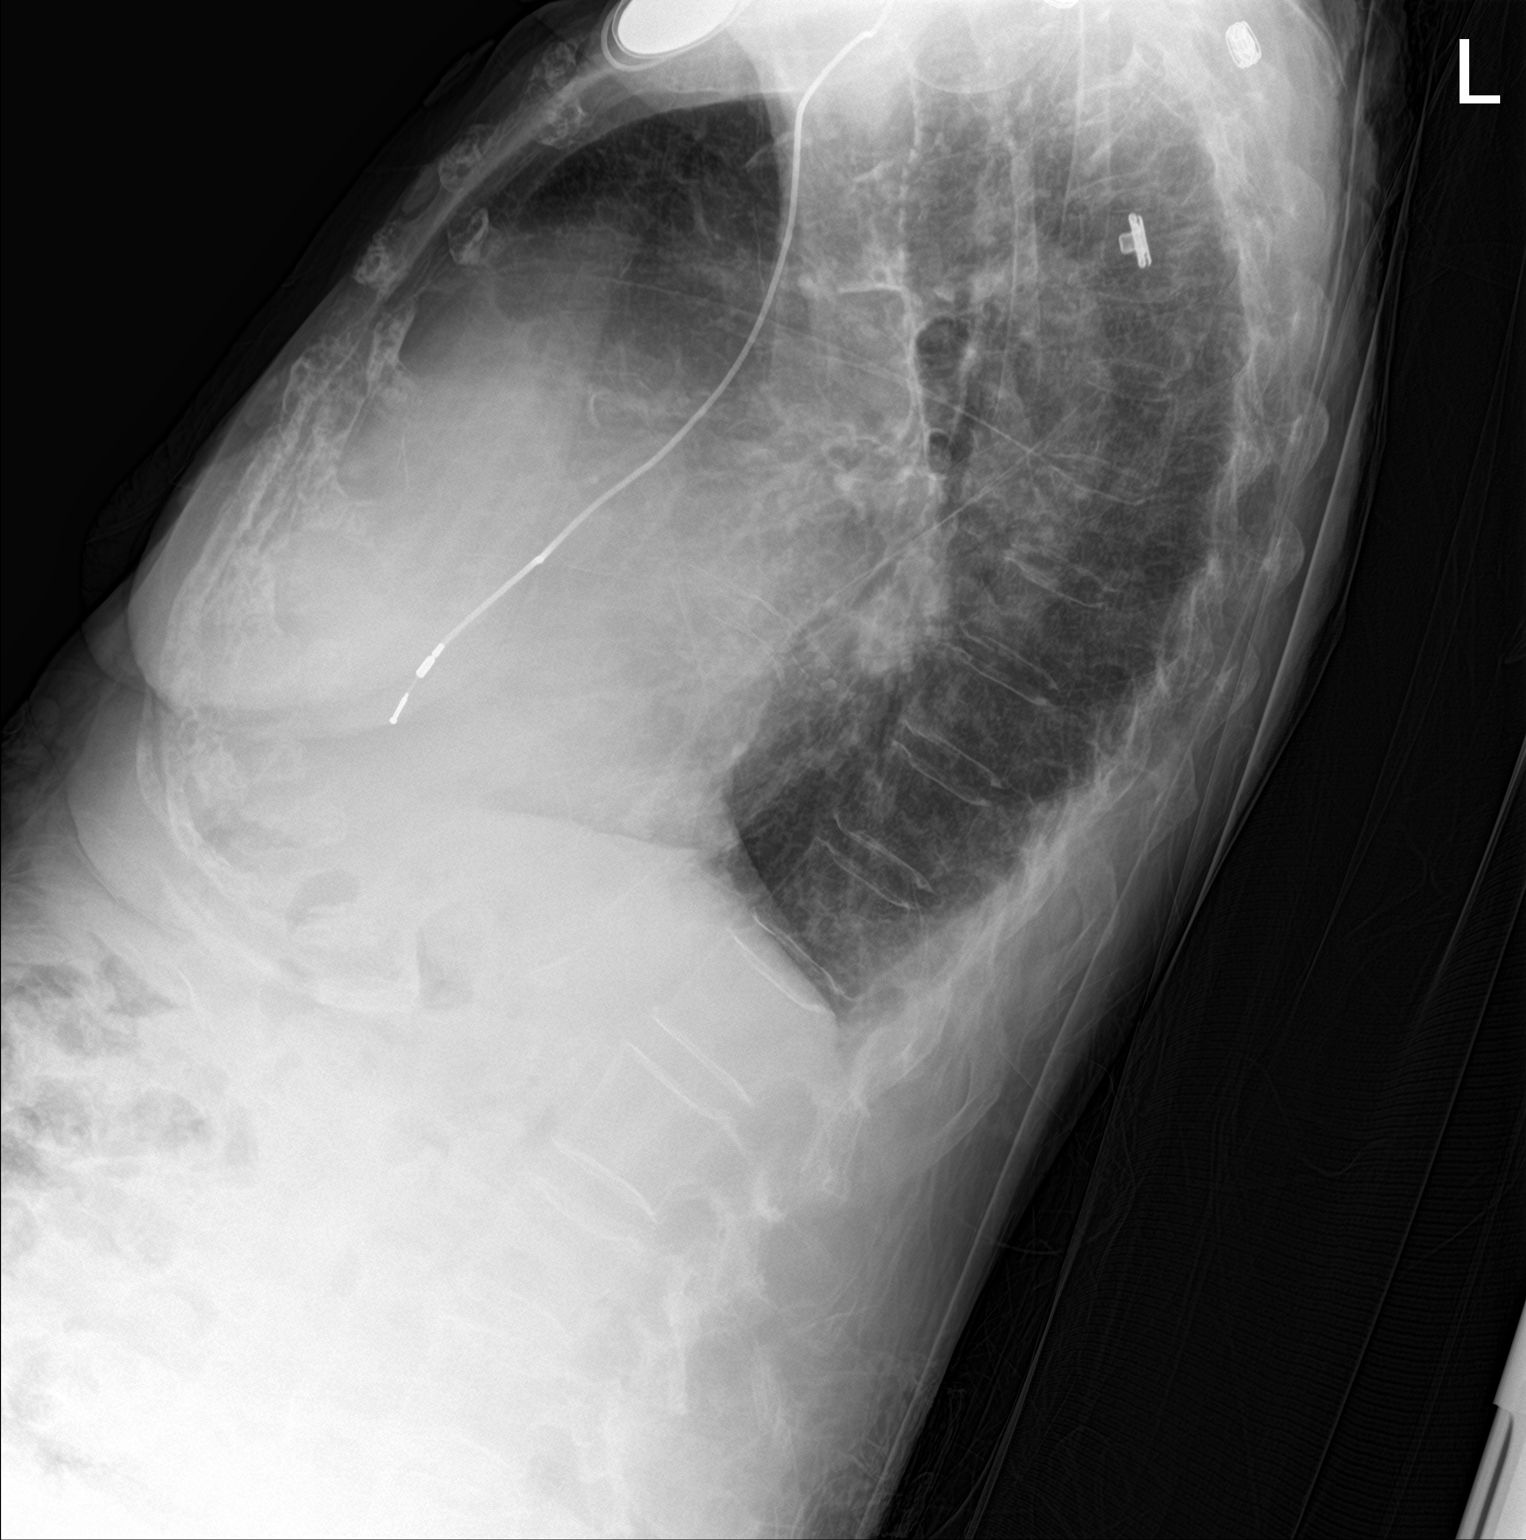

[chest ap]
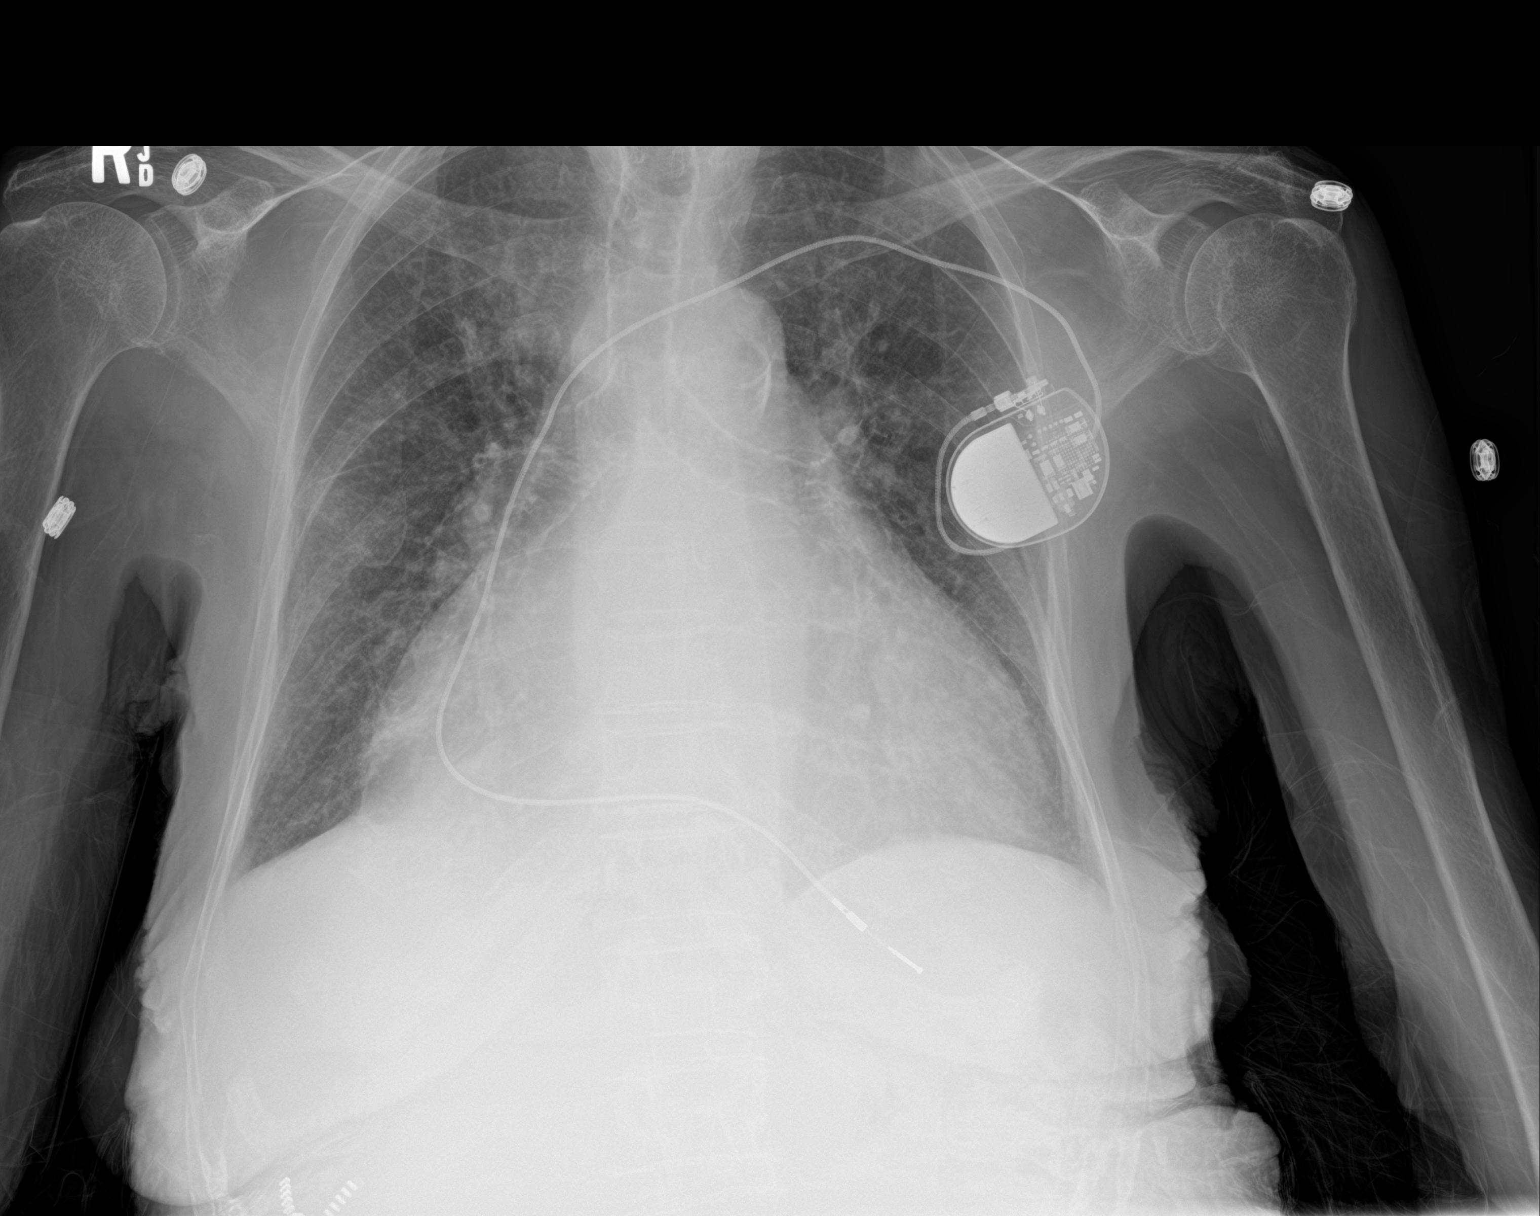

[2 of 2 positions shown; findings below may reference images not displayed]

FINDINGS: Cardiac shadow remains enlarged. Pacing device is again seen. The
lungs are well aerated bilaterally with mild interstitial changes
stable from the prior study. No sizable infiltrate or effusion is
seen. No bony abnormality is noted.
IMPRESSION: No acute abnormality seen.

## 2019-02-08 IMAGING — CT CT RENAL STONE PROTOCOL
1 of 4 series · 6 of 46 positions shown, 11 images · non-contrast
Comparison: None.

CLINICAL DATA: Right side and back pain for about 2 weeks.

EXAM:
CT ABDOMEN AND PELVIS WITHOUT CONTRAST
TECHNIQUE: Multidetector CT imaging of the abdomen and pelvis was performed
following the standard protocol without IV contrast.

[Series 309: lung 5.0 · axial · 0.68mm/px · z∈[-370,-270]mm · 6 of 28 slices shown, 11 images]
[im 4/28  soft-tissue]
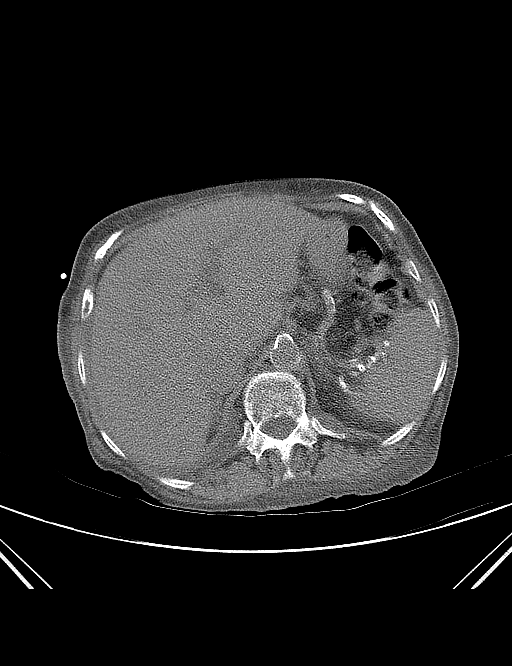
[im 4/28  bone]
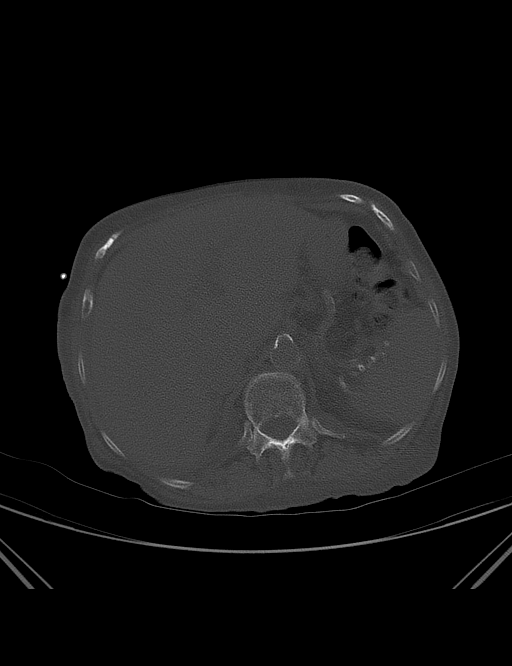
[im 8/28  soft-tissue]
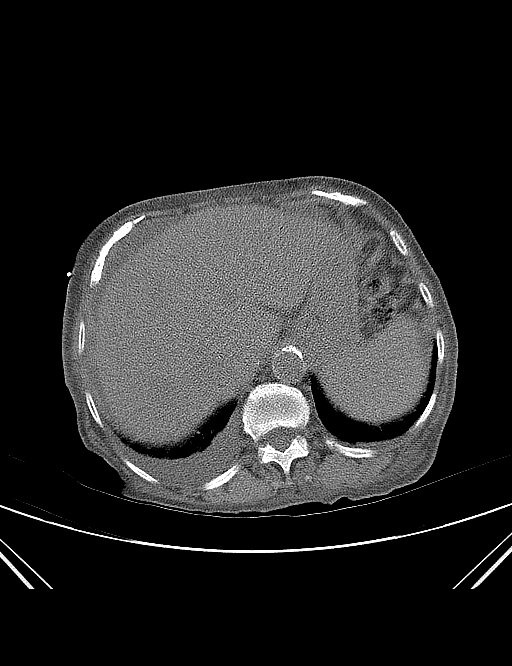
[im 12/28  soft-tissue]
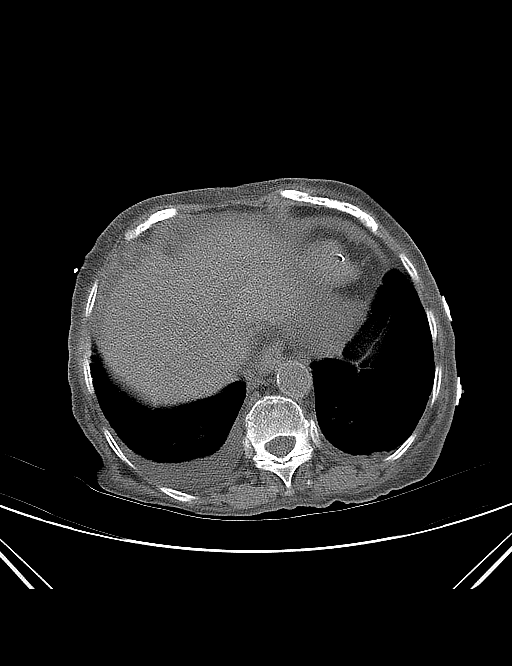
[im 12/28  lung]
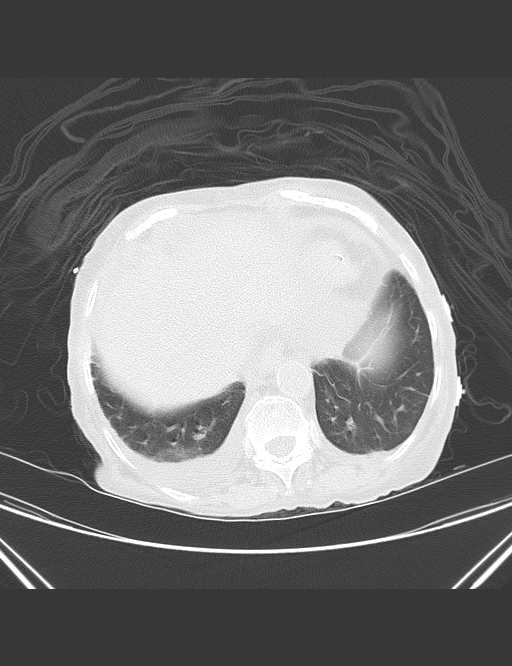
[im 16/28  soft-tissue]
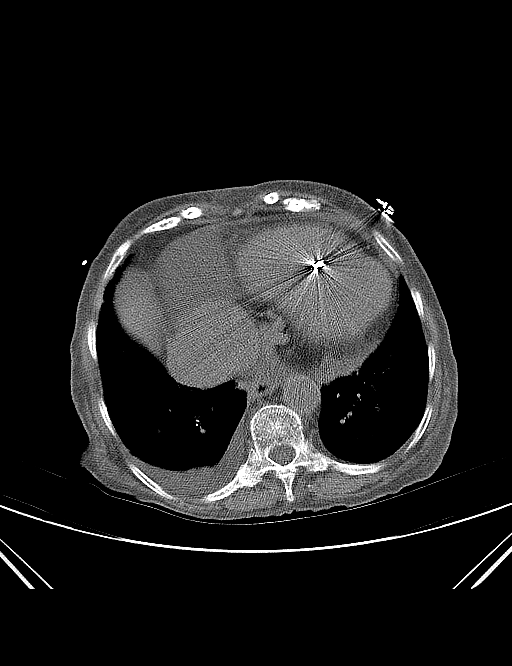
[im 16/28  lung]
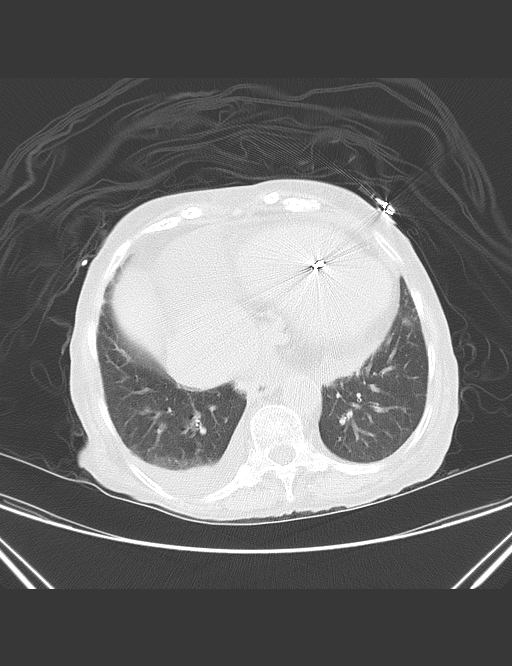
[im 20/28  soft-tissue]
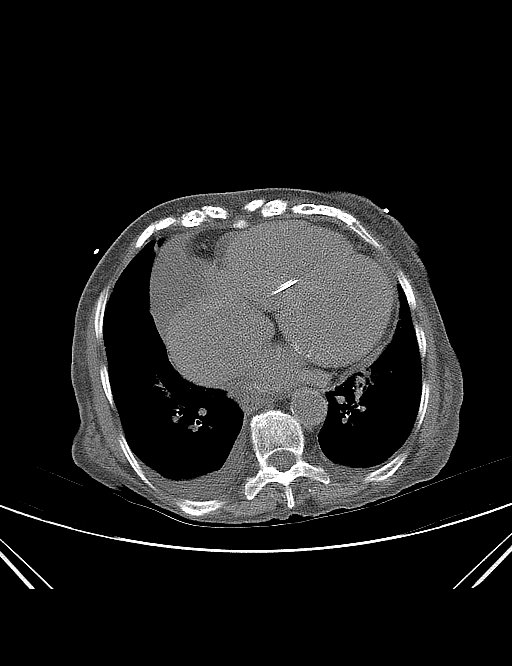
[im 20/28  lung]
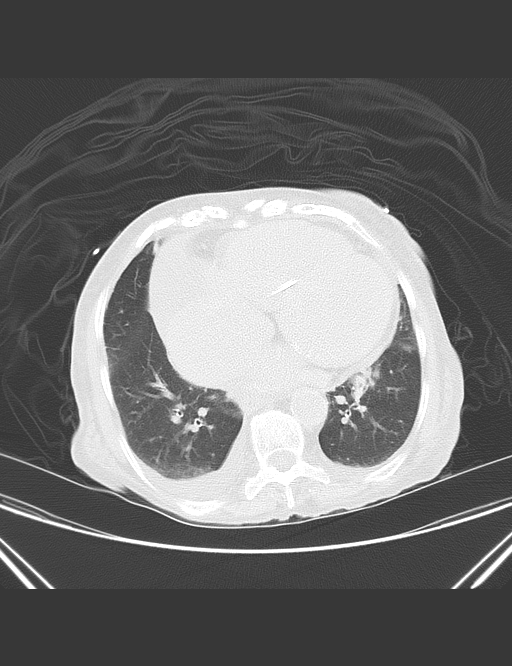
[im 24/28  soft-tissue]
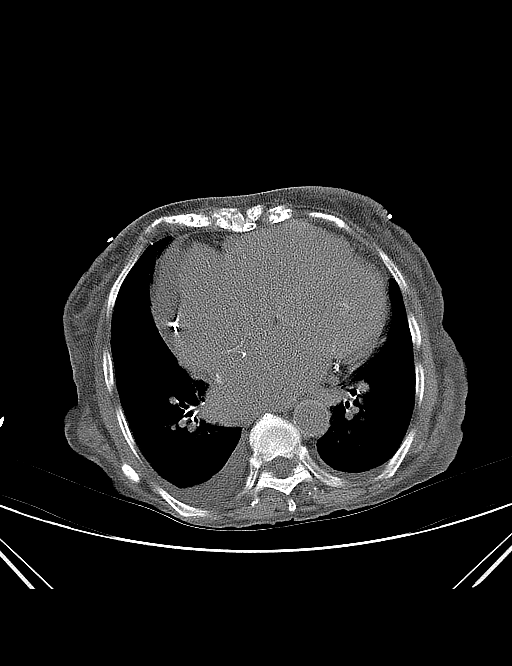
[im 24/28  lung]
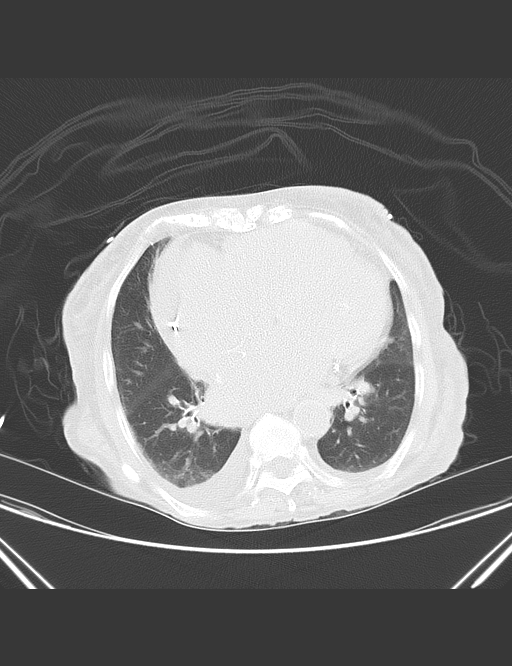

[6 of 46 positions shown; findings below may reference images not displayed]

FINDINGS: Lower chest: Heart is markedly enlarged. Pacer/AICD lead noted in
the right ventricle. Small pericardial effusion. Small right pleural
effusion associated.

Hepatobiliary: Nodular liver contour suggests cirrhosis. Gallbladder
not clearly visualized and may be decompressed or surgically absent.
No intrahepatic or extrahepatic biliary dilation.

Pancreas: No focal mass lesion. No dilatation of the main duct. No
intraparenchymal cyst. No peripancreatic edema.

Spleen: No splenomegaly. No focal mass lesion.

Adrenals/Urinary Tract: No adrenal nodule or mass. 3.4 cm cystic
lesion interpolar right kidney appears to have an inferior
septation. 1.8 cm lesion upper pole right kidney measures water
attenuation and is probably a cyst. 3.4 cm lesion upper pole left
kidney is well-defined in approaches water attenuation, probably a
cyst. 2.2 cm probable cyst noted interpolar left kidney. Vascular
calcification noted in the hilum of each kidney, but no evidence for
calcified renal stones. No hydronephrosis on either side. No
evidence for hydroureter The urinary bladder appears normal for the
degree of distention.

Stomach/Bowel: Stomach is nondistended. No gastric wall thickening.
No evidence of outlet obstruction. Duodenum is normally positioned
as is the ligament of Treitz. No small bowel wall thickening. No
small bowel dilatation. The terminal ileum is normal. The appendix
is not visualized, but there is no edema or inflammation in the
region of the cecum. Diverticuli are seen scattered along the entire
length of the colon without CT findings of diverticulitis. Prominent
stool volume noted in the rectum.

Vascular/Lymphatic: There is abdominal aortic atherosclerosis
without aneurysm. There is no gastrohepatic or hepatoduodenal
ligament lymphadenopathy. No intraperitoneal or retroperitoneal
lymphadenopathy. No pelvic sidewall lymphadenopathy.

Reproductive: Uterus surgically absent.

Other: Small volume intraperitoneal free fluid.

Musculoskeletal: Tiny sclerotic focus posterior eleventh rib is
probably a bone island. Bone windows reveal no worrisome lytic or
sclerotic osseous lesions. Diffuse body wall edema.
IMPRESSION: 1. No acute findings in the abdomen or pelvis.
2. Bilateral renal cysts including a 3.4 cm interpolar right renal
lesion that appears to have an associated septation. Follow-up
imaging in 6 months could be used to ensure stability, as clinically
warranted.
3. Nodular liver contour suggests cirrhosis.
4. Small volume ascites with small right pleural effusion.
5. Cardiomegaly with small pericardial effusion.
6. Diffuse body wall edema.
